# Patient Record
Sex: Female | Born: 1960 | Race: White | Hispanic: No | Marital: Single | Smoking: Never smoker
Health system: Southern US, Community
[De-identification: ages and names within clinical notes are randomized; demographics above are authoritative.]

## PROBLEM LIST (undated history)

## (undated) DIAGNOSIS — F99 Mental disorder, not otherwise specified: Secondary | ICD-10-CM

## (undated) DIAGNOSIS — N840 Polyp of corpus uteri: Secondary | ICD-10-CM

## (undated) DIAGNOSIS — N950 Postmenopausal bleeding: Secondary | ICD-10-CM

## (undated) DIAGNOSIS — E039 Hypothyroidism, unspecified: Secondary | ICD-10-CM

## (undated) DIAGNOSIS — K219 Gastro-esophageal reflux disease without esophagitis: Secondary | ICD-10-CM

## (undated) DIAGNOSIS — F419 Anxiety disorder, unspecified: Secondary | ICD-10-CM

## (undated) DIAGNOSIS — R42 Dizziness and giddiness: Secondary | ICD-10-CM

## (undated) DIAGNOSIS — R195 Other fecal abnormalities: Secondary | ICD-10-CM

## (undated) DIAGNOSIS — R9389 Abnormal findings on diagnostic imaging of other specified body structures: Secondary | ICD-10-CM

## (undated) HISTORY — PX: NO PAST SURGERIES: SHX2092

## (undated) HISTORY — DX: Abnormal findings on diagnostic imaging of other specified body structures: R93.89

## (undated) HISTORY — DX: Polyp of corpus uteri: N84.0

## (undated) HISTORY — DX: Mental disorder, not otherwise specified: F99

## (undated) HISTORY — DX: Other fecal abnormalities: R19.5

## (undated) HISTORY — DX: Postmenopausal bleeding: N95.0

---

## 2002-02-13 ENCOUNTER — Encounter: Payer: Self-pay | Admitting: Family Medicine

## 2002-02-13 ENCOUNTER — Ambulatory Visit (HOSPITAL_COMMUNITY): Admission: RE | Admit: 2002-02-13 | Discharge: 2002-02-13 | Payer: Self-pay | Admitting: Family Medicine

## 2004-06-06 ENCOUNTER — Ambulatory Visit (HOSPITAL_COMMUNITY): Admission: RE | Admit: 2004-06-06 | Discharge: 2004-06-06 | Payer: Self-pay | Admitting: Obstetrics and Gynecology

## 2005-06-12 ENCOUNTER — Ambulatory Visit (HOSPITAL_COMMUNITY): Admission: RE | Admit: 2005-06-12 | Discharge: 2005-06-12 | Payer: Self-pay | Admitting: Family Medicine

## 2006-06-15 ENCOUNTER — Ambulatory Visit (HOSPITAL_COMMUNITY): Admission: RE | Admit: 2006-06-15 | Discharge: 2006-06-15 | Payer: Self-pay | Admitting: Family Medicine

## 2007-06-29 ENCOUNTER — Ambulatory Visit (HOSPITAL_COMMUNITY): Admission: RE | Admit: 2007-06-29 | Discharge: 2007-06-29 | Payer: Self-pay | Admitting: Family Medicine

## 2007-07-18 ENCOUNTER — Other Ambulatory Visit: Admission: RE | Admit: 2007-07-18 | Discharge: 2007-07-18 | Payer: Self-pay | Admitting: Obstetrics and Gynecology

## 2008-07-18 ENCOUNTER — Ambulatory Visit (HOSPITAL_COMMUNITY): Admission: RE | Admit: 2008-07-18 | Discharge: 2008-07-18 | Payer: Self-pay | Admitting: Obstetrics & Gynecology

## 2008-07-18 ENCOUNTER — Other Ambulatory Visit: Admission: RE | Admit: 2008-07-18 | Discharge: 2008-07-18 | Payer: Self-pay | Admitting: Obstetrics and Gynecology

## 2009-08-30 ENCOUNTER — Ambulatory Visit (HOSPITAL_COMMUNITY): Admission: RE | Admit: 2009-08-30 | Discharge: 2009-08-30 | Payer: Self-pay | Admitting: Obstetrics & Gynecology

## 2010-03-23 ENCOUNTER — Encounter: Payer: Self-pay | Admitting: Obstetrics and Gynecology

## 2010-09-17 ENCOUNTER — Other Ambulatory Visit: Payer: Self-pay | Admitting: Obstetrics & Gynecology

## 2010-09-17 DIAGNOSIS — Z139 Encounter for screening, unspecified: Secondary | ICD-10-CM

## 2010-09-23 ENCOUNTER — Ambulatory Visit (HOSPITAL_COMMUNITY)
Admission: RE | Admit: 2010-09-23 | Discharge: 2010-09-23 | Disposition: A | Discharge status: Home / Self Care | Payer: Medicaid Other | Source: Ambulatory Visit | Attending: Obstetrics & Gynecology | Admitting: Obstetrics & Gynecology

## 2010-09-23 DIAGNOSIS — Z1231 Encounter for screening mammogram for malignant neoplasm of breast: Secondary | ICD-10-CM | POA: Insufficient documentation

## 2010-09-23 DIAGNOSIS — Z139 Encounter for screening, unspecified: Secondary | ICD-10-CM

## 2011-01-01 ENCOUNTER — Other Ambulatory Visit (HOSPITAL_COMMUNITY)
Admission: RE | Admit: 2011-01-01 | Discharge: 2011-01-01 | Disposition: A | Discharge status: Home / Self Care | Payer: Medicaid Other | Source: Ambulatory Visit | Attending: Obstetrics and Gynecology | Admitting: Obstetrics and Gynecology

## 2011-01-01 ENCOUNTER — Other Ambulatory Visit: Payer: Self-pay | Admitting: Adult Health

## 2011-01-01 DIAGNOSIS — Z01419 Encounter for gynecological examination (general) (routine) without abnormal findings: Secondary | ICD-10-CM | POA: Insufficient documentation

## 2011-10-06 ENCOUNTER — Other Ambulatory Visit (HOSPITAL_COMMUNITY): Payer: Self-pay | Admitting: Family Medicine

## 2011-10-06 DIAGNOSIS — Z139 Encounter for screening, unspecified: Secondary | ICD-10-CM

## 2011-10-12 ENCOUNTER — Ambulatory Visit (HOSPITAL_COMMUNITY)
Admission: RE | Admit: 2011-10-12 | Discharge: 2011-10-12 | Disposition: A | Discharge status: Home / Self Care | Payer: Medicaid Other | Source: Ambulatory Visit | Attending: Family Medicine | Admitting: Family Medicine

## 2011-10-12 DIAGNOSIS — Z139 Encounter for screening, unspecified: Secondary | ICD-10-CM

## 2011-10-12 DIAGNOSIS — Z1231 Encounter for screening mammogram for malignant neoplasm of breast: Secondary | ICD-10-CM | POA: Insufficient documentation

## 2012-09-27 ENCOUNTER — Other Ambulatory Visit: Payer: Self-pay

## 2012-09-27 DIAGNOSIS — Z1231 Encounter for screening mammogram for malignant neoplasm of breast: Secondary | ICD-10-CM

## 2012-10-14 ENCOUNTER — Ambulatory Visit: Payer: Medicaid Other

## 2012-10-14 ENCOUNTER — Ambulatory Visit (HOSPITAL_COMMUNITY): Payer: Medicaid Other

## 2012-10-18 ENCOUNTER — Ambulatory Visit (HOSPITAL_COMMUNITY)
Admission: RE | Admit: 2012-10-18 | Discharge: 2012-10-18 | Disposition: A | Discharge status: Home / Self Care | Payer: Medicaid Other | Source: Ambulatory Visit | Attending: Adult Health | Admitting: Adult Health

## 2012-10-18 DIAGNOSIS — Z1231 Encounter for screening mammogram for malignant neoplasm of breast: Secondary | ICD-10-CM | POA: Insufficient documentation

## 2012-10-24 ENCOUNTER — Other Ambulatory Visit: Payer: Self-pay | Admitting: Family Medicine

## 2012-10-24 DIAGNOSIS — R928 Other abnormal and inconclusive findings on diagnostic imaging of breast: Secondary | ICD-10-CM

## 2012-11-09 ENCOUNTER — Ambulatory Visit (HOSPITAL_COMMUNITY)
Admission: RE | Admit: 2012-11-09 | Discharge: 2012-11-09 | Disposition: A | Discharge status: Home / Self Care | Payer: Medicaid Other | Source: Ambulatory Visit | Attending: Family Medicine | Admitting: Family Medicine

## 2012-11-09 ENCOUNTER — Other Ambulatory Visit: Payer: Self-pay | Admitting: Family Medicine

## 2012-11-09 DIAGNOSIS — R928 Other abnormal and inconclusive findings on diagnostic imaging of breast: Secondary | ICD-10-CM

## 2013-01-09 ENCOUNTER — Other Ambulatory Visit: Payer: Self-pay | Admitting: Adult Health

## 2013-02-20 ENCOUNTER — Other Ambulatory Visit: Payer: Self-pay | Admitting: Adult Health

## 2013-03-15 ENCOUNTER — Ambulatory Visit (INDEPENDENT_AMBULATORY_CARE_PROVIDER_SITE_OTHER): Payer: Medicaid Other | Admitting: Adult Health

## 2013-03-15 ENCOUNTER — Encounter: Payer: Self-pay | Admitting: Adult Health

## 2013-03-15 ENCOUNTER — Other Ambulatory Visit (HOSPITAL_COMMUNITY)
Admission: RE | Admit: 2013-03-15 | Discharge: 2013-03-15 | Disposition: A | Discharge status: Home / Self Care | Payer: Medicaid Other | Source: Ambulatory Visit | Attending: Adult Health | Admitting: Adult Health

## 2013-03-15 ENCOUNTER — Encounter: Payer: Self-pay | Admitting: Gastroenterology

## 2013-03-15 VITALS — BP 140/72 | HR 78 | Ht 62.0 in | Wt 166.0 lb

## 2013-03-15 DIAGNOSIS — Z01419 Encounter for gynecological examination (general) (routine) without abnormal findings: Secondary | ICD-10-CM | POA: Insufficient documentation

## 2013-03-15 DIAGNOSIS — R195 Other fecal abnormalities: Secondary | ICD-10-CM

## 2013-03-15 DIAGNOSIS — Z1151 Encounter for screening for human papillomavirus (HPV): Secondary | ICD-10-CM | POA: Insufficient documentation

## 2013-03-15 DIAGNOSIS — Z0000 Encounter for general adult medical examination without abnormal findings: Secondary | ICD-10-CM

## 2013-03-15 DIAGNOSIS — Z1212 Encounter for screening for malignant neoplasm of rectum: Secondary | ICD-10-CM

## 2013-03-15 HISTORY — DX: Other fecal abnormalities: R19.5

## 2013-03-15 LAB — HEMOCCULT GUIAC POC 1CARD (OFFICE): Fecal Occult Blood, POC: POSITIVE

## 2013-03-15 NOTE — Progress Notes (Signed)
Patient ID: Audrey Collier, female   DOB: 04-28-1960, 53 y.o.   MRN: [id number] History of Present Illness: Audrey Collier is a 53 year old white female, single, mentally challenged, in for a pap and physical.She lives with her uncle and siblings.Did get flu shot this year.Care giver with her.   Current Medications, Allergies, Past Medical History, Past Surgical History, Family History and Social History were reviewed in [facility] electronic medical record.     Review of Systems: Patient denies any headaches, blurred vision, shortness of breath, chest pain, abdominal pain, problems with bowel movements, urination. No joint pains or mood changes, still has regular periods but has skipped 2 recently.    Physical Exam:BP 140/72  Pulse 78  Ht 5\' 2"  (1.575 m)  Wt 166 lb (75.297 kg)  BMI 30.35 kg/m2  LMP 03/09/2013 General:  Well developed, well nourished, no acute distress Skin:  Warm and dry, left eye is red  Neck:  Midline trachea, normal thyroid Lungs; Clear to auscultation bilaterally Breast:  No dominant palpable mass, retraction, or nipple discharge Cardiovascular: Regular rate and rhythm Abdomen:  Soft, non tender, no hepatosplenomegaly Pelvic:  External genitalia is normal in appearance.  The vagina is normal in appearance.The cervix is not seen well due to her holding legs tight, but pap performed by blind sweep and HPV.  Uterus is felt to be normal size, shape, and contour.  No  adnexal masses or tenderness noted. Rectal: Good sphincter tone, no polyps, or hemorrhoids felt.  Hemoccult positive Extremities:  No swelling or varicosities noted Psych:  No mood changes, fairly cooperative and pleasant   Impression: Yearly gyn exam Positive hemocult    Plan: Physical in 1 year Mammogram yearly  Pap in 3 years if this one normal Will send 3 hemoccult cards home and refer to Dr Harlen LabsFields Labs with Dr Renard MatterMcInnis and see him if eye not better

## 2013-03-15 NOTE — Patient Instructions (Signed)
Physical in 1 year Pap in 3 years if this one normal Mammogram yearly Had + hemoccult(blood in stool) will do 3 cards at home, will refer to Dr Darrick PennaFields for colonoscopy See Dr Renard MatterMcInnis  If eye not better

## 2013-03-31 ENCOUNTER — Ambulatory Visit (INDEPENDENT_AMBULATORY_CARE_PROVIDER_SITE_OTHER): Payer: Medicaid Other | Admitting: Adult Health

## 2013-03-31 DIAGNOSIS — Z1212 Encounter for screening for malignant neoplasm of rectum: Secondary | ICD-10-CM

## 2013-03-31 DIAGNOSIS — R195 Other fecal abnormalities: Secondary | ICD-10-CM

## 2013-03-31 LAB — HEMOCCULT GUIAC POC 1CARD (OFFICE)
Card #2 Fecal Occult Blod, POC: NEGATIVE
FECAL OCCULT BLD: NEGATIVE
FECAL OCCULT BLD: NEGATIVE

## 2013-03-31 NOTE — Progress Notes (Signed)
Patient ID: Audrey Collier, female   DOB: Jan 08, 1961, 53 y.o.   MRN: [id number] Pt brought in 3 hemoccults cards that resulted all negative.

## 2013-04-11 ENCOUNTER — Ambulatory Visit (INDEPENDENT_AMBULATORY_CARE_PROVIDER_SITE_OTHER): Payer: Medicaid Other | Admitting: Gastroenterology

## 2013-04-11 ENCOUNTER — Encounter: Payer: Self-pay | Admitting: Gastroenterology

## 2013-04-11 VITALS — BP 127/77 | HR 86 | Temp 98.6°F | Wt 167.0 lb

## 2013-04-11 DIAGNOSIS — K5900 Constipation, unspecified: Secondary | ICD-10-CM

## 2013-04-11 DIAGNOSIS — R195 Other fecal abnormalities: Secondary | ICD-10-CM

## 2013-04-11 MED ORDER — LINACLOTIDE 145 MCG PO CAPS: 145.0000 ug | ORAL_CAPSULE | Freq: Every day | ORAL | Status: DC

## 2013-04-11 MED ORDER — PEG 3350-KCL-NA BICARB-NACL 420 G PO SOLR: 4000.0000 mL | ORAL | Status: DC

## 2013-04-11 NOTE — Assessment & Plan Note (Signed)
Without signs of overt GI bleeding. No prior colonoscopy. Needs initial screening colonoscopy. NO upper GI symptoms. Obtain outside labs from Dr. Renard MatterMcInnis to evaluate for any abnormalities.   Proceed with colonoscopy with Dr. Darrick PennaFields in the near future. The risks, benefits, and alternatives have been discussed in detail with the patient. They state understanding and desire to proceed.

## 2013-04-11 NOTE — Patient Instructions (Addendum)
Start taking Linzess 1 capsule 30 minutes prior to first meal of day. We have provided a voucher for you for a month supply free.   We have scheduled you for a colonoscopy with Dr. Darrick PennaFields in the future.   Constipation, Adult Constipation is when a person:  Poops (bowel movement) less than 3 times a week.  Has a hard time pooping.  Has poop that is dry, hard, or bigger than normal. HOME CARE   Eat more fiber, such as fruits, vegetables, whole grains like brown rice, and beans.  Eat less fatty foods and sugar. This includes JamaicaFrench fries, hamburgers, cookies, candy, and soda.  If you are not getting enough fiber from food, take products with added fiber in them (supplements).  Drink enough fluid to keep your pee (urine) clear or pale yellow.  Go to the restroom when you feel like you need to poop. Do not hold it.  Only take medicine as told by your doctor. Do not take medicines that help you poop (laxatives) without talking to your doctor first.  Exercise on a regular basis, or as told by your doctor. GET HELP RIGHT AWAY IF:   You have bright red blood in your poop (stool).  Your constipation lasts more than 4 days or gets worse.  You have belly (abdomen) or butt (rectal) pain.  You have thin poop (as thin as a pencil).  You lose weight, and it cannot be explained. MAKE SURE YOU:   Understand these instructions.  Will watch your condition.  Will get help right away if you are not doing well or get worse. Document Released: 08/05/2007 Document Revised: 05/11/2011 Document Reviewed: 11/28/2012 [facility] Patient Information 2014 [place] [place].

## 2013-04-11 NOTE — Assessment & Plan Note (Signed)
Start Linzess 145 mcg daily. Voucher provided. 3 month return.

## 2013-04-11 NOTE — Progress Notes (Signed)
Primary Care Physician:  Alice ReichertMCINNIS,ANGUS G, MD Primary Gastroenterologist:  Dr. Darrick PennaFields   Chief Complaint  Patient presents with  . Colonoscopy    HPI:   Audrey Collier presents today at the request of Cyril MourningJennifer Griffin, NP, secondary to heme positive stool on yearly hemoccult. She has mild mental challenges but able to respond to questions, aware of situation and need for further GI evaluation. She denies any prior colonoscopy. No hematochezia, melena. Chronic constipation. Up to 4-5 days without a BM. No abdominal pain. Coffee helps sometimes. No upper GI symptoms, dysphagia, or loss of appetite.   Past Medical History  Diagnosis Date  . Mental disorder     mentally challenged  . Fecal occult blood test positive 03/15/2013    will send 3 cards home and refer to GI for colonoscopy    Past Surgical History  Procedure Laterality Date  . No past surgeries      Current Outpatient Prescriptions  Medication Sig Dispense Refill  . loratadine (CLARITIN) 10 MG tablet Take 10 mg by mouth daily as needed for allergies.       No current facility-administered medications for this visit.    Allergies as of 04/11/2013  . (No Known Allergies)    Family History  Problem Relation Age of Onset  . Diabetes Maternal Aunt   . Hypertension Maternal Aunt   . Diabetes Maternal Uncle   . Colon cancer Neg Hx     History   Social History  . Marital Status: Single    Spouse Name: N/A    Number of Children: N/A  . Years of Education: N/A   Occupational History  . Not on file.   Social History Main Topics  . Smoking status: Never Smoker   . Smokeless tobacco: Never Used  . Alcohol Use: No  . Drug Use: No  . Sexual Activity: No   Other Topics Concern  . Not on file   Social History Narrative  . No narrative on file    Review of Systems: Gen: Denies any fever, chills, fatigue, weight loss, lack of appetite.  CV: Denies chest pain, heart palpitations, peripheral edema,  syncope.  Resp: +DOE GI: see HPI GU : Denies urinary burning, urinary frequency, urinary hesitancy MS: Denies joint pain, muscle weakness, cramps, or limitation of movement.  Derm: Denies rash, itching, dry skin Psych: Denies depression, anxiety, memory loss, and confusion Heme: Denies bruising, bleeding, and enlarged lymph nodes.  Physical Exam: BP 127/77  Pulse 86  Temp(Src) 98.6 F (37 C) (Oral)  Wt 167 lb (75.751 kg)  LMP 04/04/2013 General:   Alert and oriented. Pleasant and cooperative. Well-nourished and well-developed.  Head:  Normocephalic and atraumatic. Eyes:  Without icterus, sclera clear and conjunctiva pink.  Ears:  Normal auditory acuity. Nose:  No deformity, discharge,  or lesions. Mouth:  No deformity or lesions, oral mucosa pink.  Neck:  Supple, without mass or thyromegaly. Lungs:  Clear to auscultation bilaterally. No wheezes, rales, or rhonchi. No distress.  Heart:  S1, S2 present without murmurs appreciated.  Abdomen:  +BS, soft, non-tender and non-distended. No HSM noted. No guarding or rebound. No masses appreciated.  Rectal:  Deferred  Msk:  Symmetrical without gross deformities. Normal posture. Extremities:  Without clubbing or edema. Neurologic:  Alert and  oriented x4;  grossly normal neurologically. Skin:  Intact without significant lesions or rashes. Cervical Nodes:  No significant cervical adenopathy. Psych:  Alert and cooperative. Normal mood and affect.

## 2013-04-12 NOTE — Progress Notes (Signed)
cc'd to pcp 

## 2013-04-17 ENCOUNTER — Encounter (HOSPITAL_COMMUNITY): Payer: Self-pay | Admitting: Pharmacy Technician

## 2013-04-20 ENCOUNTER — Other Ambulatory Visit (HOSPITAL_COMMUNITY): Payer: Self-pay | Admitting: Family Medicine

## 2013-04-20 DIAGNOSIS — R928 Other abnormal and inconclusive findings on diagnostic imaging of breast: Secondary | ICD-10-CM

## 2013-05-05 ENCOUNTER — Encounter (HOSPITAL_COMMUNITY): Admission: RE | Payer: Self-pay | Source: Ambulatory Visit

## 2013-05-05 ENCOUNTER — Ambulatory Visit (HOSPITAL_COMMUNITY): Admission: RE | Admit: 2013-05-05 | Payer: Medicaid Other | Source: Ambulatory Visit | Admitting: Gastroenterology

## 2013-05-05 SURGERY — COLONOSCOPY
Anesthesia: Moderate Sedation

## 2013-05-08 ENCOUNTER — Telehealth: Payer: Self-pay | Admitting: Gastroenterology

## 2013-05-08 NOTE — Telephone Encounter (Signed)
Patient was scheduled for TCS w/SLF on Friday March 6th and her aide cancelled because they could not get her to drink the prep she would not even attempt to consume it, please advise

## 2013-05-08 NOTE — Telephone Encounter (Signed)
Needs OV to discuss

## 2013-05-08 NOTE — Telephone Encounter (Signed)
Appointment R/S for April 8th

## 2013-05-17 ENCOUNTER — Ambulatory Visit (HOSPITAL_COMMUNITY)
Admission: RE | Admit: 2013-05-17 | Discharge: 2013-05-17 | Disposition: A | Discharge status: Home / Self Care | Payer: Medicaid Other | Source: Ambulatory Visit | Attending: Family Medicine | Admitting: Family Medicine

## 2013-05-17 DIAGNOSIS — R928 Other abnormal and inconclusive findings on diagnostic imaging of breast: Secondary | ICD-10-CM

## 2013-05-17 DIAGNOSIS — Z09 Encounter for follow-up examination after completed treatment for conditions other than malignant neoplasm: Secondary | ICD-10-CM | POA: Insufficient documentation

## 2013-05-17 DIAGNOSIS — N63 Unspecified lump in unspecified breast: Secondary | ICD-10-CM | POA: Insufficient documentation

## 2013-06-07 ENCOUNTER — Ambulatory Visit: Payer: Medicaid Other | Admitting: Gastroenterology

## 2013-06-07 ENCOUNTER — Telehealth: Payer: Self-pay | Admitting: Gastroenterology

## 2013-06-07 ENCOUNTER — Encounter: Payer: Self-pay | Admitting: Gastroenterology

## 2013-06-07 NOTE — Telephone Encounter (Signed)
MAILED LETTER °

## 2013-06-07 NOTE — Telephone Encounter (Signed)
Pt was a no show

## 2013-07-10 ENCOUNTER — Encounter (INDEPENDENT_AMBULATORY_CARE_PROVIDER_SITE_OTHER): Payer: Self-pay

## 2013-07-10 ENCOUNTER — Ambulatory Visit (INDEPENDENT_AMBULATORY_CARE_PROVIDER_SITE_OTHER): Payer: Medicaid Other | Admitting: Gastroenterology

## 2013-07-10 ENCOUNTER — Encounter: Payer: Self-pay | Admitting: Gastroenterology

## 2013-07-10 VITALS — BP 123/78 | HR 95 | Temp 97.4°F | Ht 65.0 in | Wt 167.2 lb

## 2013-07-10 DIAGNOSIS — R195 Other fecal abnormalities: Secondary | ICD-10-CM

## 2013-07-10 DIAGNOSIS — K5900 Constipation, unspecified: Secondary | ICD-10-CM

## 2013-07-10 MED ORDER — LINACLOTIDE 290 MCG PO CAPS: 290.0000 ug | ORAL_CAPSULE | Freq: Every day | ORAL | Status: DC

## 2013-07-10 NOTE — Assessment & Plan Note (Addendum)
Heme+ stool. Cancelled last colonoscopy due to patient not taking bowel prep. States she did not like the way it tastes. Was given trilitely. Patient's caregiver, Leta BaptistRhonda Heidler (Aunt by marriage) interested in trying once more. We will try with MiraLax prep which may be more palatable for the patient. Also start Linzess 290mcg daily for constipation.  Check CBC. I have discussed the risks, alternatives, benefits with regards to but not limited to the risk of reaction to medication, bleeding, infection, perforation and the patient is agreeable to proceed. Written consent to be obtained.  Patient does not have official healthcare POA. Discussed with Leta BaptistRhonda Rho (Aunt by marriage) today that they need to consider obtaining in near future. Patient has some mild cognitive deficits and not consentable. We have call in with appropriate parties to determine if next of kin can consent or if official POA has to be obtained.

## 2013-07-10 NOTE — Progress Notes (Signed)
cc'd to pcp 

## 2013-07-10 NOTE — Progress Notes (Addendum)
Primary Care Physician: Alice ReichertMCINNIS,ANGUS G, MD  Primary Gastroenterologist:  Jonette EvaSandi Fields, MD   Chief Complaint  Patient presents with  . Follow-up    HPI: Audrey Collier is a 53 y.o. female here for followup. She was seen back in February 2015 for Hemoccult-positive stool. She has mild cognitive deficits. She was scheduled for colonoscopy she would not take the bowel prep due to the taste. She has chronic constipation. May go up to 4-5 days without a bowel movement. Given Linzess at last ov, prescription was never picked up. History obtained from Audrey Collier Schar, aunt by marriage whom the patient lives with. There's been no report of abdominal pain, vomiting. Appetite is good. No other complaints such as heartburn, dysphagia. No reported melena or rectal bleeding.  Current Outpatient Prescriptions  Medication Sig Dispense Refill  . calcium-vitamin D 250-100 MG-UNIT per tablet Take 1 tablet by mouth 2 (two) times daily. Unknown amount taken by patient.      . fexofenadine (ALLEGRA) 180 MG tablet Take 180 mg by mouth daily.      . Multiple Vitamin (MULTIVITAMIN) tablet Take 1 tablet by mouth daily.      . Linaclotide (LINZESS) 290 MCG CAPS capsule Take 1 capsule (290 mcg total) by mouth daily.  30 capsule  11   No current facility-administered medications for this visit.    Allergies as of 07/10/2013  . (No Known Allergies)   Past Medical History  Diagnosis Date  . Mental disorder     mentally challenged  . Fecal occult blood test positive 03/15/2013    will send 3 cards home and refer to GI for colonoscopy   Past Surgical History  Procedure Laterality Date  . No past surgeries     Family History  Problem Relation Age of Onset  . Diabetes Maternal Aunt   . Hypertension Maternal Aunt   . Diabetes Maternal Uncle   . Colon cancer Neg Hx    History   Social History  . Marital Status: Single    Spouse Name: N/A    Number of Children: N/A  . Years of Education: N/A    Social History Main Topics  . Smoking status: Never Smoker   . Smokeless tobacco: Never Used  . Alcohol Use: No  . Drug Use: No  . Sexual Activity: No   Other Topics Concern  . None   Social History Narrative  . None    ROS:  General: Negative for anorexia, weight loss, fever, chills, fatigue, weakness. ENT: Negative for hoarseness, difficulty swallowing , nasal congestion. CV: Negative for chest pain, angina, palpitations, dyspnea on exertion, peripheral edema.  Respiratory: Negative for dyspnea at rest, dyspnea on exertion, cough, sputum, wheezing.  GI: See history of present illness. GU:  Negative for dysuria, hematuria, urinary incontinence, urinary frequency, nocturnal urination.  Endo: Negative for unusual weight change.    Physical Examination:   BP 123/78  Pulse 95  Temp(Src) 97.4 F (36.3 C) (Oral)  Ht 5\' 5"  (1.651 m)  Wt 167 lb 3.2 oz (75.841 kg)  BMI 27.82 kg/m2  General: Well-nourished, well-developed in no acute distress.  Eyes: No icterus. Mouth: Oropharyngeal mucosa moist and pink , no lesions erythema or exudate. Lungs: Clear to auscultation bilaterally.  Heart: Regular rate and rhythm, no murmurs rubs or gallops.  Abdomen: Bowel sounds are normal, nontender, nondistended, no hepatosplenomegaly or masses, no abdominal bruits or hernia , no rebound or guarding.   Extremities: No lower extremity edema.  No clubbing or deformities. Neuro: Alert and oriented x 4   Skin: Warm and dry, no jaundice.   Psych: Alert and cooperative, normal mood and affect.

## 2013-07-10 NOTE — Patient Instructions (Signed)
1. Colonoscopy as scheduled. Please see separate instructions. 2. Start Linzess daily, 30 minutes before breakfast for constipation. Hold if you develop severe diarrhea. Call with any problems. 3. Please have your blood work done.

## 2013-07-11 LAB — CBC WITH DIFFERENTIAL/PLATELET
BASOS ABS: 0 10*3/uL (ref 0.0–0.1)
Basophils Relative: 0 % (ref 0–1)
Eosinophils Absolute: 0.3 10*3/uL (ref 0.0–0.7)
Eosinophils Relative: 3 % (ref 0–5)
HCT: 30.2 % — ABNORMAL LOW (ref 36.0–46.0)
HEMOGLOBIN: 8.7 g/dL — AB (ref 12.0–15.0)
LYMPHS PCT: 16 % (ref 12–46)
Lymphs Abs: 1.4 10*3/uL (ref 0.7–4.0)
MCH: 18.7 pg — ABNORMAL LOW (ref 26.0–34.0)
MCHC: 28.8 g/dL — ABNORMAL LOW (ref 30.0–36.0)
MCV: 64.8 fL — AB (ref 78.0–100.0)
Monocytes Absolute: 0.4 10*3/uL (ref 0.1–1.0)
Monocytes Relative: 5 % (ref 3–12)
NEUTROS ABS: 6.7 10*3/uL (ref 1.7–7.7)
NEUTROS PCT: 76 % (ref 43–77)
PLATELETS: 325 10*3/uL (ref 150–400)
RBC: 4.66 MIL/uL (ref 3.87–5.11)
RDW: 18.4 % — AB (ref 11.5–15.5)
WBC: 8.8 10*3/uL (ref 4.0–10.5)

## 2013-07-11 NOTE — Progress Notes (Signed)
I spoke with Audrey Collier and made her aware per Audrey Collier, [employer] Compliance, they need to contact [employer] and obtain Guardianship paper work before we can proceed with an elective tcs.    Audrey Collier stated she will give [employer] a call and call us back to reschedule, once she receives guardianship paperwork.

## 2013-07-12 NOTE — Progress Notes (Signed)
Quick Note:  She has profound microcytic anemia. -->Start ferrous sulfate 325mg  BID. -->Need to add-on iron/tibc, ferritin, TTG, IgA level. -->Patient now needs TCS +/- EGD for microcytic anemia, heme positive stool. Hold iron 7 days before.  -->Please let guardian know that we need to move on this sooner than later, please get paperwork in order so they can consent for testing.    ______

## 2013-07-13 ENCOUNTER — Other Ambulatory Visit: Payer: Self-pay

## 2013-07-13 DIAGNOSIS — D649 Anemia, unspecified: Secondary | ICD-10-CM

## 2013-07-13 NOTE — Progress Notes (Signed)
Quick Note:  I called and spoke to a caregiver, Estella HuskBilly JO. She will give a message to [place] and Chanetta MarshallJimmy to return the call. Lab orders have been faced to [facility]. ______

## 2013-07-13 NOTE — Progress Notes (Signed)
Quick Note:  I called [facility] Customer service and these labs cannot be added. I will [facility] and call and have pt go back. ______

## 2013-07-18 NOTE — Progress Notes (Signed)
Quick Note:  LMOM for a return call and also mailed a letter for Chanetta MarshallJimmy or Bjorn LoserRhonda to call. ______

## 2013-07-31 ENCOUNTER — Other Ambulatory Visit: Payer: Self-pay | Admitting: Gastroenterology

## 2013-07-31 ENCOUNTER — Telehealth: Payer: Self-pay | Admitting: *Deleted

## 2013-07-31 NOTE — Telephone Encounter (Signed)
Pt was returning a call. Please advise [phone]

## 2013-07-31 NOTE — Progress Notes (Signed)
Quick Note:  I had a VM from [facility], and she said they were checking in to [facility] or Legal guardian of pt and it looks like it will take awhile. She said if pt cannot sign for herself to have a colonoscopy, then it will be awhile. I called her back at [phone] and [facility] for a return call.  I told her pt needs some medication and some labs, please call ASAP. ______

## 2013-07-31 NOTE — Progress Notes (Signed)
Quick Note:  I spoke to [place]. She said they went to see a lawyer and he said they will have to take pt to PCP and have them say that pt is not capable of making her medical decisions. Then they will be sent to [employer]. Then sent back to lawyers office.  She said she is working 12 hours a day on second shift and trying to take care of all of her husband's siblings. She has different appts for the others also. She has not yet made the appt for this pt to see PCP about this.  She is aware for pt to start the Ferrous sulfate 325 mg bid And get the labs done.  I asked her to call me in a few days and give me an update on the status of their plans. ______

## 2013-08-01 LAB — IGA: IgA: <6 mg/dL — ABNORMAL LOW (ref 69–380)

## 2013-08-01 LAB — TISSUE TRANSGLUTAMINASE, IGA: Tissue Transglutaminase Ab, IgA: 1 U/mL

## 2013-08-01 LAB — IRON AND TIBC
%SAT: 5 % — ABNORMAL LOW (ref 20–55)
Iron: 21 ug/dL — ABNORMAL LOW (ref 42–145)
TIBC: 450 ug/dL (ref 250–470)
UIBC: 429 ug/dL — ABNORMAL HIGH (ref 125–400)

## 2013-08-01 LAB — FERRITIN: FERRITIN: 2 ng/mL — AB (ref 10–291)

## 2013-08-01 NOTE — Telephone Encounter (Signed)
I called and spoke to [place] on 07/31/2013 at 2:02.

## 2013-08-03 NOTE — Progress Notes (Signed)
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 Need to see if TTG IgG can be added on to labs. Patient clearly has IDA and very important to rule out celiac disease given that procedures on hold. ______

## 2013-08-04 NOTE — Progress Notes (Signed)
Quick Note:  I called Hospital doctor and added the TTG IgG. Spoke to [place]. ______

## 2013-08-07 LAB — TISSUE TRANSGLUTAMINASE, IGG: Tissue Transglut Ab: 3.4 U/mL (ref <20)

## 2013-08-07 NOTE — Progress Notes (Signed)
Quick Note:  No evidence of celiac disease. Caregiver working on getting POA for TCS+/-EGD but will take time per prior conversations.  Patient has had significant undetectable IgA level, NOT normal.  Since work up has been delayed, we will need to recheck her CBC and IgA level in 2 weeks. ______

## 2013-08-10 ENCOUNTER — Other Ambulatory Visit: Payer: Self-pay

## 2013-08-10 DIAGNOSIS — D582 Other hemoglobinopathies: Secondary | ICD-10-CM

## 2013-08-10 NOTE — Progress Notes (Signed)
Quick Note:  I called and gave the report to [facility]. She said they still haven't done anything about the POA, she has been having trouble with her teeth, root canal, etc. They are going on vacation on the week of August 21, 2013 but she will try to do the lab work around 08/28/2013. She said she spoke to [facility] at Dr. Renard Matter office and she said they have never had to do all of this before about the POA, etc. I told her things have changed a lot. Lab orders faxed to [facility].   Bjorn Loser did say that she felt that [place] would understand if they explained to her, and could sign for herself. Said she was aware and was planning to drink the prep.  She said if they wanted to do that, they could call Bjorn Loser and schedule with her on a day that she will be off and she can take the pt to the hospital. I told her that I would let Verlon Au know. ______

## 2013-10-26 NOTE — Progress Notes (Signed)
Quick Note:  Patient still needs TCS +/- EGD for microcytic anemia, heme positive stool. Hold iron 7 days before.  She stills needs updated CBC, IgA level. There has been issues about consent. We recently were told by Risk Management that a "legally appointed guardian" or the majority of parents/children available, or an individual with established relationship with the patient acting in good faith on behalf of the patient" could consent. I believe patient's uncle or aunt whom she lives with would likely qualify. I will request Camille's advise.    ______

## 2013-10-27 NOTE — Progress Notes (Signed)
Quick Note:  Please contact family regarding TCS+/-EGD for microcytic anemia and heme +stool. We can triage her. Notify endo regarding consent issues. See other notes here. ______

## 2013-10-27 NOTE — Progress Notes (Signed)
Quick Note:  I agree with you Verlon Au, please make sure we alert Endo ahead of time, Soledad Gerlach. ______

## 2013-10-30 NOTE — Progress Notes (Signed)
Quick Note:  I called and spoke to pt's caregiver, Audrey Collier. She said that [place] and Bjorn Loser have gone on vacation and should return Thursday. She will tell them to call here and also I am mailed a letter to them, letting them know that one of them can sign for pt to have procedure. ______

## 2013-11-07 ENCOUNTER — Telehealth: Payer: Self-pay | Admitting: Gastroenterology

## 2013-11-07 NOTE — Telephone Encounter (Signed)
Bjorn Loser would like for pt to be scheduled for her procedures this week or next week if possible. Bjorn Loser has had knee surgery and is still out of work now. Please advise!

## 2013-11-08 ENCOUNTER — Telehealth: Payer: Self-pay

## 2013-11-08 NOTE — Telephone Encounter (Signed)
Per Tana Coast, PA ok to triage pt for TCS/EGD for positive hemoccult and microcytic anemia.

## 2013-11-08 NOTE — Telephone Encounter (Signed)
Discussed with Dr. Darrick Penna. OK to triage for TCS/EGD.  Patient will need to hold iron 7 days before.

## 2013-11-10 NOTE — Telephone Encounter (Signed)
Audrey Collier RETURNING CALL ABOUT [name]'S MEDICATION.    PLEASE CALL HER [phone]

## 2013-11-13 ENCOUNTER — Other Ambulatory Visit: Payer: Self-pay

## 2013-11-13 DIAGNOSIS — R195 Other fecal abnormalities: Secondary | ICD-10-CM

## 2013-11-13 DIAGNOSIS — D509 Iron deficiency anemia, unspecified: Secondary | ICD-10-CM

## 2013-11-13 NOTE — Telephone Encounter (Signed)
Triage sent to Dr. Darrick Penna.

## 2013-11-13 NOTE — Telephone Encounter (Signed)
MOVI PREP SPLIT DOSING- CLEAR LIQUIDS WITH BREAKFAST.  

## 2013-11-13 NOTE — Telephone Encounter (Signed)
Gastroenterology Pre-Procedure Review  Request Date: 9/11/08/2013 Requesting Physician: Tana Coast, PA  PATIENT REVIEW QUESTIONS: The patient responded to the following health history questions as indicated:    1. Diabetes Melitis: no 2. Joint replacements in the past 12 months: no 3. Major health problems in the past 3 months: no 4. Has an artificial valve or MVP: no 5. Has a defibrillator: no 6. Has been advised in past to take antibiotics in advance of a procedure like teeth cleaning: no    MEDICATIONS & ALLERGIES:    Patient reports the following regarding taking any blood thinners:   Plavix? no Aspirin? no Coumadin? no  Patient confirms/reports the following medications:  Current Outpatient Prescriptions  Medication Sig Dispense Refill  . calcium-vitamin D 250-100 MG-UNIT per tablet Take 1 tablet by mouth 2 (two) times daily. Unknown amount taken by patient.      . fexofenadine (ALLEGRA) 180 MG tablet Take 180 mg by mouth daily.      . Linaclotide (LINZESS) 290 MCG CAPS capsule Take 1 capsule (290 mcg total) by mouth daily.  30 capsule  11  . Multiple Vitamin (MULTIVITAMIN) tablet Take 1 tablet by mouth daily.      . NON FORMULARY IRON  65 MG    TAKES ONE TABLET DAILY       No current facility-administered medications for this visit.    Patient confirms/reports the following allergies:  No Known Allergies  No orders of the defined types were placed in this encounter.    AUTHORIZATION INFORMATION Primary Insurance:   ID #:  Group #:  Pre-Cert / Auth required:  Pre-Cert / Auth #:   Secondary Insurance:   ID #:  Group #:  Pre-Cert / Auth required: Pre-Cert / Auth #:   SCHEDULE INFORMATION: Procedure has been scheduled as follows:  Date:  12/01/2013                   Time: 10:30 AM  Location: [facility] Short Stay  This Gastroenterology Pre-Precedure Review Form is being routed to the following provider(s): Jonette Eva, MD

## 2013-11-14 MED ORDER — PEG-KCL-NACL-NASULF-NA ASC-C 100 G PO SOLR: 1.0000 | ORAL | Status: DC

## 2013-11-14 NOTE — Telephone Encounter (Signed)
Rx sent to the pharmacy and instructions mailed to pt.  

## 2013-11-14 NOTE — Telephone Encounter (Signed)
Written instructions added to her instructions to hold the iron for 7 days prior to procedure.

## 2013-11-24 NOTE — Telephone Encounter (Signed)
I called and told Britta Mccreedy in Endo that pt's caregiver's, brother and sister-in-law,  can sign for the pt to have the procedures on 12/01/2013. I told her if she has questions, she may call and speak to [place].

## 2013-11-24 NOTE — Telephone Encounter (Signed)
Please see Result note of 07/31/2013 ( Tissue Transglutaminase) for further explanation.

## 2013-12-01 ENCOUNTER — Encounter (HOSPITAL_COMMUNITY)
Admission: RE | Disposition: A | Discharge status: Home / Self Care | Payer: Self-pay | Source: Ambulatory Visit | Attending: Gastroenterology

## 2013-12-01 ENCOUNTER — Encounter (HOSPITAL_COMMUNITY): Payer: Self-pay

## 2013-12-01 ENCOUNTER — Ambulatory Visit (HOSPITAL_COMMUNITY)
Admission: RE | Admit: 2013-12-01 | Discharge: 2013-12-01 | Disposition: A | Discharge status: Home / Self Care | Payer: Medicaid Other | Source: Ambulatory Visit | Attending: Gastroenterology | Admitting: Gastroenterology

## 2013-12-01 DIAGNOSIS — K648 Other hemorrhoids: Secondary | ICD-10-CM | POA: Diagnosis not present

## 2013-12-01 DIAGNOSIS — B9681 Helicobacter pylori [H. pylori] as the cause of diseases classified elsewhere: Secondary | ICD-10-CM | POA: Diagnosis not present

## 2013-12-01 DIAGNOSIS — D509 Iron deficiency anemia, unspecified: Secondary | ICD-10-CM | POA: Insufficient documentation

## 2013-12-01 DIAGNOSIS — R195 Other fecal abnormalities: Secondary | ICD-10-CM

## 2013-12-01 DIAGNOSIS — K2960 Other gastritis without bleeding: Secondary | ICD-10-CM | POA: Diagnosis not present

## 2013-12-01 DIAGNOSIS — Q438 Other specified congenital malformations of intestine: Secondary | ICD-10-CM | POA: Diagnosis not present

## 2013-12-01 HISTORY — PX: COLONOSCOPY: SHX5424

## 2013-12-01 HISTORY — PX: ESOPHAGOGASTRODUODENOSCOPY: SHX5428

## 2013-12-01 SURGERY — COLONOSCOPY
Anesthesia: Moderate Sedation

## 2013-12-01 MED ORDER — SODIUM CHLORIDE 0.9 % IV SOLN
INTRAVENOUS | Status: DC
  Administered 2013-12-01: 10:00:00 via INTRAVENOUS

## 2013-12-01 MED ORDER — MIDAZOLAM HCL 5 MG/5ML IJ SOLN
INTRAMUSCULAR | Status: DC | PRN
  Administered 2013-12-01: 2 mg via INTRAVENOUS
  Administered 2013-12-01 (×3): 1 mg via INTRAVENOUS
  Administered 2013-12-01 (×2): 2 mg via INTRAVENOUS
  Administered 2013-12-01: 1 mg via INTRAVENOUS

## 2013-12-01 MED ORDER — STERILE WATER FOR IRRIGATION IR SOLN
Status: DC | PRN
  Administered 2013-12-01: 11:00:00

## 2013-12-01 MED ORDER — LIDOCAINE VISCOUS 2 % MT SOLN
OROMUCOSAL | Status: DC | PRN
  Administered 2013-12-01: 3 mL via OROMUCOSAL

## 2013-12-01 MED ORDER — LIDOCAINE VISCOUS 2 % MT SOLN
OROMUCOSAL | Status: AC
  Filled 2013-12-01: qty 15

## 2013-12-01 MED ORDER — MEPERIDINE HCL 100 MG/ML IJ SOLN
INTRAMUSCULAR | Status: DC | PRN
  Administered 2013-12-01 (×3): 25 mg via INTRAVENOUS

## 2013-12-01 MED ORDER — MEPERIDINE HCL 100 MG/ML IJ SOLN
INTRAMUSCULAR | Status: AC
  Filled 2013-12-01: qty 2

## 2013-12-01 MED ORDER — MIDAZOLAM HCL 5 MG/5ML IJ SOLN
INTRAMUSCULAR | Status: AC
  Filled 2013-12-01: qty 10

## 2013-12-01 NOTE — H&P (Addendum)
  Primary Care Physician:  Lanette Hampshire, MD Primary Gastroenterologist:  Dr. Oneida Alar  Pre-Procedure History & Physical: HPI:  Audrey Collier is a 53 y.o. female here for HEME POS STOOLS.   Past Medical History  Diagnosis Date  . Mental disorder     mentally challenged  . Fecal occult blood test positive 03/15/2013    will send 3 cards home and refer to GI for colonoscopy    Past Surgical History  Procedure Laterality Date  . No past surgeries      Prior to Admission medications   Medication Sig Start Date End Date Taking? Authorizing Provider  calcium-vitamin D 250-100 MG-UNIT per tablet Take 1 tablet by mouth 2 (two) times daily. Unknown amount taken by patient.   Yes Historical Provider, MD  fexofenadine (ALLEGRA) 180 MG tablet Take 180 mg by mouth daily.   Yes Historical Provider, MD  Linaclotide (LINZESS) 290 MCG CAPS capsule Take 1 capsule (290 mcg total) by mouth daily. 07/10/13  Yes Mahala Menghini, PA-C  Multiple Vitamin (MULTIVITAMIN) tablet Take 1 tablet by mouth daily.   Yes Historical Provider, MD  NON FORMULARY IRON  65 MG    TAKES ONE TABLET DAILY   Yes Historical Provider, MD  peg 3350 powder (MOVIPREP) 100 G SOLR Take 1 kit (200 g total) by mouth as directed. 11/14/13  Yes Danie Binder, MD    Allergies as of 11/13/2013  . (No Known Allergies)    Family History  Problem Relation Age of Onset  . Diabetes Maternal Aunt   . Hypertension Maternal Aunt   . Diabetes Maternal Uncle   . Colon cancer Neg Hx     History   Social History  . Marital Status: Single    Spouse Name: N/A    Number of Children: N/A  . Years of Education: N/A   Occupational History  . Not on file.   Social History Main Topics  . Smoking status: Never Smoker   . Smokeless tobacco: Never Used  . Alcohol Use: No  . Drug Use: No  . Sexual Activity: No   Other Topics Concern  . Not on file   Social History Narrative  . No narrative on file    Review of Systems: See HPI,  otherwise negative ROS   Physical Exam: BP 135/70  Pulse 101  Temp(Src) 98.2 F (36.8 C) (Oral)  Resp 16  Ht _0  (1.702 m)  SpO2 99%  LMP 04/16/2013 General:   Alert,  pleasant and cooperative in NAD Head:  Normocephalic and atraumatic. Neck:  Supple; Lungs:  Clear throughout to auscultation.    Heart:  Regular rate and rhythm. Abdomen:  Soft, nontender and nondistended. Normal bowel sounds, without guarding, and without rebound.   Neurologic:  Alert and  oriented x4;  grossly normal neurologically.  Impression/Plan:     HEME POS STOOLS  PLAN:  1.TCS?EGD

## 2013-12-01 NOTE — Discharge Instructions (Addendum)
NO OBVIOUS SOURCE FOR A LOW BLOOD COUNT& IRON WAS IDENTIFIED.  You HAVE internal hemorrhoids. You have gastritis.  I biopsied your stomach AND SMALL BOWEL.     FOLLOW A HIGH FIBER/LOW FAT DIET. AVOID ITEMS THAT CAUSE BLOATING. SEE INFO BELOW.  YOUR BIOPSY RESULTS WILL BE BACK IN 14 DAYS.  Next colonoscopy in 10 years.   ENDOSCOPY Care After Read the instructions outlined below and refer to this sheet in the next week. These discharge instructions provide you with general information on caring for yourself after you leave the hospital. While your treatment has been planned according to the most current medical practices available, unavoidable complications occasionally occur. If you have any problems or questions after discharge, call DR. Lakita Sahlin, [phone].  ACTIVITY  You may resume your regular activity, but move at a slower pace for the next 24 hours.   Take frequent rest periods for the next 24 hours.   Walking will help get rid of the air and reduce the bloated feeling in your belly (abdomen).   No driving for 24 hours (because of the medicine (anesthesia) used during the test).   You may shower.   Do not sign any important legal documents or operate any machinery for 24 hours (because of the anesthesia used during the test).    NUTRITION  Drink plenty of fluids.   You may resume your normal diet as instructed by your doctor.   Begin with a light meal and progress to your normal diet. Heavy or fried foods are harder to digest and may make you feel sick to your stomach (nauseated).   Avoid alcoholic beverages for 24 hours or as instructed.    MEDICATIONS  You may resume your normal medications.   WHAT YOU CAN EXPECT TODAY  Some feelings of bloating in the abdomen.   Passage of more gas than usual.   Spotting of blood in your stool or on the toilet paper  .  IF YOU HAD POLYPS REMOVED DURING THE ENDOSCOPY:  Eat a soft diet IF YOU HAVE NAUSEA, BLOATING,  ABDOMINAL PAIN, OR VOMITING.    FINDING OUT THE RESULTS OF YOUR TEST Not all test results are available during your visit. DR. Darrick Penna WILL CALL YOU WITHIN 14 DAYS OF YOUR PROCEDUE WITH YOUR RESULTS. Do not assume everything is normal if you have not heard from DR. Swetha Rayle IN TWO WEEKS, CALL HER OFFICE AT [phone].  SEEK IMMEDIATE MEDICAL ATTENTION AND CALL THE OFFICE: [phone] IF:  You have more than a spotting of blood in your stool.   Your belly is swollen (abdominal distention).   You are nauseated or vomiting.   You have a temperature over 101F.   You have abdominal pain or discomfort that is severe or gets worse throughout the day.   Gastritis  Gastritis is an inflammation (the body's way of reacting to injury and/or infection) of the stomach. It is often caused by viral or bacterial (germ) infections. It can also be caused BY ALCOHOL, ASPIRIN, BC/GOODY POWDER'S, (IBUPROFEN) MOTRIN, OR ALEVE (NAPROXEN), chemicals (including alcohol), SPICY FOODS, and medications. This illness may be associated with generalized malaise (feeling tired, not well), UPPER ABDOMINAL STOMACH cramps, and fever. One common bacterial cause of gastritis is an organism known as H. Pylori. This can be treated with antibiotics.    High-Fiber Diet A high-fiber diet changes your normal diet to include more whole grains, legumes, fruits, and vegetables. Changes in the diet involve replacing refined carbohydrates with unrefined foods. The  calorie level of the diet is essentially unchanged. The Dietary Reference Intake (recommended amount) for adult males is 38 grams per day. For adult females, it is 25 grams per day. Pregnant and lactating women should consume 28 grams of fiber per day. Fiber is the intact part of a plant that is not broken down during digestion. Functional fiber is fiber that has been isolated from the plant to provide a beneficial effect in the body. PURPOSE  Increase stool bulk.   Ease  and regulate bowel movements.   Lower cholesterol.  INDICATIONS THAT YOU NEED MORE FIBER  Constipation and hemorrhoids.   Uncomplicated diverticulosis (intestine condition) and irritable bowel syndrome.   Weight management.   As a protective measure against hardening of the arteries (atherosclerosis), diabetes, and cancer.   GUIDELINES FOR INCREASING FIBER IN THE DIET  Start adding fiber to the diet slowly. A gradual increase of about 5 more grams (2 slices of whole-wheat bread, 2 servings of most fruits or vegetables, or 1 bowl of high-fiber cereal) per day is best. Too rapid an increase in fiber may result in constipation, flatulence, and bloating.   Drink enough water and fluids to keep your urine clear or pale yellow. Water, juice, or caffeine-free drinks are recommended. Not drinking enough fluid may cause constipation.   Eat a variety of high-fiber foods rather than one type of fiber.   Try to increase your intake of fiber through using high-fiber foods rather than fiber pills or supplements that contain small amounts of fiber.   The goal is to change the types of food eaten. Do not supplement your present diet with high-fiber foods, but replace foods in your present diet.  INCLUDE A VARIETY OF FIBER SOURCES  Replace refined and processed grains with whole grains, canned fruits with fresh fruits, and incorporate other fiber sources. White rice, white breads, and most bakery goods contain little or no fiber.   Brown whole-grain rice, buckwheat oats, and many fruits and vegetables are all good sources of fiber. These include: broccoli, Brussels sprouts, cabbage, cauliflower, beets, sweet potatoes, white potatoes (skin on), carrots, tomatoes, eggplant, squash, berries, fresh fruits, and dried fruits.   Cereals appear to be the richest source of fiber. Cereal fiber is found in whole grains and bran. Bran is the fiber-rich outer coat of cereal grain, which is largely removed in  refining. In whole-grain cereals, the bran remains. In breakfast cereals, the largest amount of fiber is found in those with "bran" in their names. The fiber content is sometimes indicated on the label.   You may need to include additional fruits and vegetables each day.   In baking, for 1 cup white flour, you may use the following substitutions:   1 cup whole-wheat flour minus 2 tablespoons.   1/2 cup white flour plus 1/2 cup whole-wheat flour.   Low-Fat Diet BREADS, CEREALS, PASTA, RICE, DRIED PEAS, AND BEANS These products are high in carbohydrates and most are low in fat. Therefore, they can be increased in the diet as substitutes for fatty foods. They too, however, contain calories and should not be eaten in excess. Cereals can be eaten for snacks as well as for breakfast.  Include foods that contain fiber (fruits, vegetables, whole grains, and legumes). Research shows that fiber may lower blood cholesterol levels, especially the water-soluble fiber found in fruits, vegetables, oat products, and legumes. FRUITS AND VEGETABLES It is good to eat fruits and vegetables. Besides being sources of fiber, both are rich in  vitamins and some minerals. They help you get the daily allowances of these nutrients. Fruits and vegetables can be used for snacks and desserts. MEATS Limit lean meat, chicken, [place], and fish to no more than 6 ounces per day. Beef, Pork, and Lamb Use lean cuts of beef, pork, and lamb. Lean cuts include:  Extra-lean ground beef.  Arm roast.  Sirloin tip.  Center-cut ham.  Round steak.  Loin chops.  Rump roast.  Tenderloin.  Trim all fat off the outside of meats before cooking. It is not necessary to severely decrease the intake of red meat, but lean choices should be made. Lean meat is rich in protein and contains a highly absorbable form of iron. Premenopausal women, in particular, should avoid reducing lean red meat because this could increase the risk for low red blood  cells (iron-deficiency anemia). The organ meats, such as liver, sweetbreads, kidneys, and brain are very rich in cholesterol. They should be limited. Chicken and [place] These are good sources of protein. The fat of poultry can be reduced by removing the skin and underlying fat layers before cooking. Chicken and [place] can be substituted for lean red meat in the diet. Poultry should not be fried or covered with high-fat sauces. Fish and Shellfish Fish is a good source of protein. Shellfish contain cholesterol, but they usually are low in saturated fatty acids. The preparation of fish is important. Like chicken and [place], they should not be fried or covered with high-fat sauces. EGGS Egg whites contain no fat or cholesterol. They can be eaten often. Try 1 to 2 egg whites instead of whole eggs in recipes or use egg substitutes that do not contain yolk. MILK AND DAIRY PRODUCTS Use skim or 1% milk instead of 2% or whole milk. Decrease whole milk, natural, and processed cheeses. Use nonfat or low-fat (2%) cottage cheese or low-fat cheeses made from vegetable oils. Choose nonfat or low-fat (1 to 2%) yogurt. Experiment with evaporated skim milk in recipes that call for heavy cream. Substitute low-fat yogurt or low-fat cottage cheese for sour cream in dips and salad dressings. Have at least 2 servings of low-fat dairy products, such as 2 glasses of skim (or 1%) milk each day to help get your daily calcium intake.  FATS AND OILS Reduce the total intake of fats, especially saturated fat. Butterfat, lard, and beef fats are high in saturated fat and cholesterol. These should be avoided as much as possible. Vegetable fats do not contain cholesterol, but certain vegetable fats, such as coconut oil, palm oil, and palm kernel oil are very high in saturated fats. These should be limited. These fats are often used in bakery goods, processed foods, popcorn, oils, and nondairy creamers. Vegetable shortenings and some peanut  butters contain hydrogenated oils, which are also saturated fats. Read the labels on these foods and check for saturated vegetable oils. Unsaturated vegetable oils and fats do not raise blood cholesterol. However, they should be limited because they are fats and are high in calories. Total fat should still be limited to 30% of your daily caloric intake. Desirable liquid vegetable oils are corn oil, cottonseed oil, olive oil, canola oil, safflower oil, soybean oil, and sunflower oil. Peanut oil is not as good, but small amounts are acceptable. Buy a heart-healthy tub margarine that has no partially hydrogenated oils in the ingredients. Mayonnaise and salad dressings often are made from unsaturated fats, but they should also be limited because of their high calorie and fat content. Seeds,  nuts, peanut butter, olives, and avocados are high in fat, but the fat is mainly the unsaturated type. These foods should be limited mainly to avoid excess calories and fat. OTHER EATING TIPS Snacks  Most sweets should be limited as snacks. They tend to be rich in calories and fats, and their caloric content outweighs their nutritional value. Some good choices in snacks are graham crackers, melba toast, soda crackers, bagels (no egg), English muffins, fruits, and vegetables. These snacks are preferable to snack crackers, [place] fries, and chips. Popcorn should be air-popped or cooked in small amounts of liquid vegetable oil. Desserts Eat fruit, low-fat yogurt, and fruit ices. AVOID pastries, cake, and cookies. Sherbet, angel food cake, gelatin dessert, frozen low-fat yogurt, or other frozen products that do not contain saturated fat (pure fruit juice bars, frozen ice pops) are also acceptable.  COOKING METHODS Choose those methods that use little or no fat. They include: Poaching.  Braising.  Steaming.  Grilling.  Baking.  Stir-frying.  Broiling.  Microwaving.  Foods can be cooked in a nonstick pan without added  fat, or use a nonfat cooking spray in regular cookware. Limit fried foods and avoid frying in saturated fat. Add moisture to lean meats by using water, broth, cooking wines, and other nonfat or low-fat sauces along with the cooking methods mentioned above. Soups and stews should be chilled after cooking. The fat that forms on top after a few hours in the refrigerator should be skimmed off. When preparing meals, avoid using excess salt. Salt can contribute to raising blood pressure in some people. EATING AWAY FROM HOME Order entres, potatoes, and vegetables without sauces or butter. When meat exceeds the size of a deck of cards (3 to 4 ounces), the rest can be taken home for another meal. Choose vegetable or fruit salads and ask for low-calorie salad dressings to be served on the side. Use dressings sparingly. Limit high-fat toppings, such as bacon, crumbled eggs, cheese, sunflower seeds, and olives. Ask for heart-healthy tub margarine instead of butter.  Hemorrhoids Hemorrhoids are dilated (enlarged) veins around the rectum. Sometimes clots will form in the veins. This makes them swollen and painful. These are called thrombosed hemorrhoids. Causes of hemorrhoids include:  Constipation.   Straining to have a bowel movement.   HEAVY LIFTING HOME CARE INSTRUCTIONS  Eat a well balanced diet and drink 6 to 8 glasses of water every day to avoid constipation. You may also use a bulk laxative.   Avoid straining to have bowel movements.   Keep anal area dry and clean.   Do not use a donut shaped pillow or sit on the toilet for long periods. This increases blood pooling and pain.   Move your bowels when your body has the urge; this will require less straining and will decrease pain and pressure.

## 2013-12-02 NOTE — Op Note (Signed)
[facility] [address] [place], [id number]   COLONOSCOPY PROCEDURE REPORT  PATIENT: Audrey Collier, Dayzee D  MR#: [id number] BIRTHDATE: Sep 03, 1960 , 53  yrs. old GENDER: female ENDOSCOPIST: Jonette EvaSandi Sianna Garofano, MD REFERRED AO:ZHYQMBY:Angus Renard MatterMcInnis, M.D. PROCEDURE DATE:  12/01/2013 PROCEDURE:   Colonoscopy, diagnostic INDICATIONS:iron deficiency anemia.-JUN 2015 FERRITIN 2 MEDICATIONS: Demerol 75 mg IV and Versed 8 mg IV  DESCRIPTION OF PROCEDURE:    Physical exam was performed.  Informed consent was obtained from the patient after explaining the benefits, risks, and alternatives to procedure.  The patient was connected to monitor and placed in left lateral position. Continuous oxygen was provided by nasal cannula and IV medicine administered through an indwelling cannula.  After administration of sedation and rectal exam, the patients rectum was intubated and the EC-[id number] ([id number]  colonoscope was advanced under direct visualization to the ileum.  The scope was removed slowly by carefully examining the color, texture, anatomy, and integrity mucosa on the way out.  The patient was recovered in endoscopy and discharged home in satisfactory condition.    COLON FINDINGS: The examined terminal ileum appeared to be normal. , The LEFT colon IS redundant.  Manual abdominal counter-pressure was used to reach the cecum.  The patient was moved on to their back to reach the cecum, The examination was otherwise normal.  , and Moderate sized internal hemorrhoids were found.  PREP QUALITY: good. CECAL W/D TIME: 12 MINS          COMPLICATIONS: None  ENDOSCOPIC IMPRESSION: 1.   NO SOURCE FOR FEDA IDENTIFIED 2.   The LEFT colon IS redundant 3.   Moderate sized internal hemorrhoids  RECOMMENDATIONS: FOLLOW A HIGH FIBER/LOW FAT DIET.  AVOID ITEMS THAT CAUSE BLOATING.  BIOPSY RESULTS WILL BE BACK IN 14 DAYS. PROCEED TO EGD Next colonoscopy in 10 years WITH AN OVERTUBE/PHENERGAN IN  PREOP.      _______________________________ eSignedJonette Eva:  Kyndell Zeiser, MD 12/02/2013 7:58 AM   CPT CODES: ICD CODES:  The ICD and CPT codes recommended by this software are interpretations from the data that the clinical staff has captured with the software.  The verification of the translation of this report to the ICD and CPT codes and modifiers is the sole responsibility of the health care institution and practicing physician where this report was generated.  [employer] will not be held responsible for the validity of the ICD and CPT codes included on this report.  AMA assumes no liability for data contained or not contained herein. CPT is a Publishing rights managerregistered trademark of the [employer].

## 2013-12-02 NOTE — Op Note (Signed)
[facility] [address] [place], [id number]   ENDOSCOPY PROCEDURE REPORT  PATIENT: Audrey Severeruitt, Audrey Collier  MR#: [id number] BIRTHDATE: 1960-04-12 , 53  yrs. old GENDER: female  ENDOSCOPIST: Jonette EvaSandi Moana Munford, MD REFERRED AO:ZHYQMBY:Angus Renard MatterMcInnis, M.Collier.  PROCEDURE DATE: 12/01/2013 PROCEDURE:   EGD w/ biopsy  INDICATIONS:iron deficiency anemia. MEDICATIONS: TCS+Demerol 25 mg IV and Versed 2 mg IV TOPICAL ANESTHETIC:   Viscous Xylocaine ASA CLASS:  DESCRIPTION OF PROCEDURE:     Physical exam was performed.  Informed consent was obtained from the patient after explaining the benefits, risks, and alternatives to the procedure.  The patient was connected to the monitor and placed in the left lateral position.  Continuous oxygen was provided by nasal cannula and IV medicine administered through an indwelling cannula.  After administration of sedation, the patients esophagus was intubated and the EG-[id number] ([id number]  endoscope was advanced under direct visualization to the second portion of the duodenum.  The scope was removed slowly by carefully examining the color, texture, anatomy, and integrity of the mucosa on the way out.  The patient was recovered in endoscopy and discharged home in satisfactory condition.   ESOPHAGUS: The mucosa of the esophagus appeared normal.   STOMACH: Mild non-erosive gastritis (inflammation) was found in the entire examined stomach.  Multiple biopsies were performed using cold forceps.   DUODENUM: The duodenal mucosa showed no abnormalities in the bulb and 2nd part of the duodenum.  Cold forceps biopsies were taken in the bulb and second portion. COMPLICATIONS: There were no immediate complications.  ENDOSCOPIC IMPRESSION: 1.   NO SOURCE FOR ANEMIA IDENTIFIED 2.   MILD Non-erosive gastritis  RECOMMENDATIONS: FOLLOW A HIGH FIBER/LOW FAT DIET.  AVOID ITEMS THAT CAUSE BLOATING.  BIOPSY RESULTS WILL BE BACK IN 14 DAYS. IF NO SOURCE FOR ANEMIA  IDENTIFIED, PT WILL NEED CAPSULE ENDOSCOPY. Next colonoscopy in 10 years WITH AN OVERTUBE/PHENERGAN IN PREOP.  REPEAT EXAM: _______________________________ eSignedJonette Eva:  Zianna Dercole, MD 12/02/2013 8:04 AM     CPT CODES: ICD CODES:  The ICD and CPT codes recommended by this software are interpretations from the data that the clinical staff has captured with the software.  The verification of the translation of this report to the ICD and CPT codes and modifiers is the sole responsibility of the health care institution and practicing physician where this report was generated.  [employer] will not be held responsible for the validity of the ICD and CPT codes included on this report.  AMA assumes no liability for data contained or not contained herein. CPT is a Publishing rights managerregistered trademark of the [employer].

## 2013-12-04 ENCOUNTER — Encounter (HOSPITAL_COMMUNITY): Payer: Self-pay | Admitting: Gastroenterology

## 2013-12-25 ENCOUNTER — Other Ambulatory Visit: Payer: Self-pay

## 2013-12-25 ENCOUNTER — Telehealth: Payer: Self-pay | Admitting: Gastroenterology

## 2013-12-25 DIAGNOSIS — D509 Iron deficiency anemia, unspecified: Secondary | ICD-10-CM

## 2013-12-25 MED ORDER — OMEPRAZOLE 20 MG PO CPDR: DELAYED_RELEASE_CAPSULE | ORAL | Status: DC

## 2013-12-25 MED ORDER — CLARITHROMYCIN 500 MG PO TABS: ORAL_TABLET | ORAL | Status: DC

## 2013-12-25 MED ORDER — AMOXICILLIN 500 MG PO TABS: ORAL_TABLET | ORAL | Status: DC

## 2013-12-25 NOTE — Telephone Encounter (Signed)
Reminders in epic °

## 2013-12-25 NOTE — Telephone Encounter (Signed)
LM for Bjorn LoserRhonda to return call. ( Lab order on file for 03/2014).

## 2013-12-25 NOTE — Telephone Encounter (Signed)
PLEASE CALL PT'S FAMILY. Her stomach Bx showed H. Pylori infection. She needs AMOXICILLIN 500 mg 2 po BID for 10 days and Biaxin 500 mg po bid for 10 days. She needs Omeprazole 20 mg BID for 10 days then 1 po DAILY for 3 mos. Med side effects include NVD, abd pain, and metallic taste.SHE SHOULD HOLD ALLEGRA WHILE TAKING ABX.  H PYLORI GASTRITIS CAN CAUSE LOW IRON AND BLOOD COUNT. SHE SHOULD HAVE A REPEAT CBC/FERRITIN 1 WEEK PRIOR TO HR NEXT VISIT IN 3 MOS E30 ANEMIA/H PYLORI GASTRITIS. NEXT TCS IN 10 YEARS.

## 2013-12-27 NOTE — Telephone Encounter (Signed)
Audrey LoserRhonda is aware of results and medications and to hold the Allegra while taking the antibiotics.  Lab order on file for Jan 2016.

## 2014-01-03 ENCOUNTER — Other Ambulatory Visit: Payer: Self-pay | Admitting: Adult Health

## 2014-01-03 DIAGNOSIS — Z09 Encounter for follow-up examination after completed treatment for conditions other than malignant neoplasm: Secondary | ICD-10-CM

## 2014-01-17 ENCOUNTER — Encounter (HOSPITAL_COMMUNITY): Payer: Medicaid Other

## 2014-01-23 ENCOUNTER — Ambulatory Visit (HOSPITAL_COMMUNITY)
Admission: RE | Admit: 2014-01-23 | Discharge: 2014-01-23 | Disposition: A | Discharge status: Home / Self Care | Payer: Medicaid Other | Source: Ambulatory Visit | Attending: Adult Health | Admitting: Adult Health

## 2014-01-23 DIAGNOSIS — N6489 Other specified disorders of breast: Secondary | ICD-10-CM | POA: Diagnosis not present

## 2014-01-23 DIAGNOSIS — Z09 Encounter for follow-up examination after completed treatment for conditions other than malignant neoplasm: Secondary | ICD-10-CM

## 2014-02-19 ENCOUNTER — Encounter: Payer: Self-pay | Admitting: Gastroenterology

## 2014-02-27 ENCOUNTER — Other Ambulatory Visit: Payer: Self-pay

## 2014-02-27 DIAGNOSIS — D509 Iron deficiency anemia, unspecified: Secondary | ICD-10-CM

## 2014-03-13 LAB — CBC WITH DIFFERENTIAL/PLATELET
BASOS ABS: 0 10*3/uL (ref 0.0–0.1)
Basophils Relative: 0 % (ref 0–1)
Eosinophils Absolute: 0.2 10*3/uL (ref 0.0–0.7)
Eosinophils Relative: 2 % (ref 0–5)
HCT: 42.5 % (ref 36.0–46.0)
Hemoglobin: 14.2 g/dL (ref 12.0–15.0)
Lymphocytes Relative: 18 % (ref 12–46)
Lymphs Abs: 1.4 10*3/uL (ref 0.7–4.0)
MCH: 28.8 pg (ref 26.0–34.0)
MCHC: 33.4 g/dL (ref 30.0–36.0)
MCV: 86.2 fL (ref 78.0–100.0)
MPV: 10.4 fL (ref 8.6–12.4)
Monocytes Absolute: 0.5 10*3/uL (ref 0.1–1.0)
Monocytes Relative: 7 % (ref 3–12)
NEUTROS ABS: 5.5 10*3/uL (ref 1.7–7.7)
Neutrophils Relative %: 73 % (ref 43–77)
PLATELETS: 281 10*3/uL (ref 150–400)
RBC: 4.93 MIL/uL (ref 3.87–5.11)
RDW: 14.1 % (ref 11.5–15.5)
WBC: 7.6 10*3/uL (ref 4.0–10.5)

## 2014-03-13 LAB — FERRITIN: Ferritin: 23 ng/mL (ref 10–291)

## 2014-03-16 ENCOUNTER — Other Ambulatory Visit: Payer: Medicaid Other | Admitting: Adult Health

## 2014-03-26 ENCOUNTER — Ambulatory Visit (INDEPENDENT_AMBULATORY_CARE_PROVIDER_SITE_OTHER): Payer: Medicaid Other | Admitting: Adult Health

## 2014-03-26 ENCOUNTER — Other Ambulatory Visit: Payer: Self-pay | Admitting: Adult Health

## 2014-03-26 ENCOUNTER — Ambulatory Visit (INDEPENDENT_AMBULATORY_CARE_PROVIDER_SITE_OTHER): Payer: Medicaid Other

## 2014-03-26 ENCOUNTER — Encounter: Payer: Self-pay | Admitting: Adult Health

## 2014-03-26 VITALS — BP 134/80 | Ht 65.0 in | Wt 176.0 lb

## 2014-03-26 DIAGNOSIS — R9389 Abnormal findings on diagnostic imaging of other specified body structures: Secondary | ICD-10-CM

## 2014-03-26 DIAGNOSIS — N840 Polyp of corpus uteri: Secondary | ICD-10-CM

## 2014-03-26 DIAGNOSIS — R102 Pelvic and perineal pain: Secondary | ICD-10-CM

## 2014-03-26 DIAGNOSIS — R938 Abnormal findings on diagnostic imaging of other specified body structures: Secondary | ICD-10-CM

## 2014-03-26 DIAGNOSIS — N950 Postmenopausal bleeding: Secondary | ICD-10-CM

## 2014-03-26 HISTORY — DX: Postmenopausal bleeding: N95.0

## 2014-03-26 HISTORY — DX: Polyp of corpus uteri: N84.0

## 2014-03-26 HISTORY — DX: Abnormal findings on diagnostic imaging of other specified body structures: R93.89

## 2014-03-26 NOTE — Patient Instructions (Signed)
Needs to come back next week for US with Dr Despina HiddenEure to see if polyp, has thickened endometrium [name] call me in am

## 2014-03-26 NOTE — Progress Notes (Signed)
Subjective:     Patient ID: Audrey Collier, female   DOB: 04/03/1960, 54 y.o.   MRN: [id number]  HPI Audrey Collier is a 54 year old white female,single, in for [place] for ?PMB.She has mild MR and lives with brother and his wife.  Review of Systems See HPI Reviewed past medical,surgical, social and family history. Reviewed medications and allergies.     Objective:   Physical Exam BP 134/80 mmHg  Ht 5\' 5"  (1.651 m)  Wt 176 lb (79.833 kg)  BMI 29.29 kg/m2  LMP 04/16/2013 (Approximate) Uterus 6.1 x 4.5 x 3.2 cm, anteverted  Endometrium 11 mm, with focal area of thickening noted ?polyp= 8.305mm  Right ovary 2.2 x 1.4 x 1.3 cm,   Left ovary 2.0 x 1.5 x 1.6 cm,   No free fluid or adnexal masses noted within the pelvis  Technician Comments:  Anteverted uterus noted with focal area of thickening noted within the endometrium (?polyp), bilateral adnexa/ovaries appear WNL, no free fluid or adnexal masses noted within the pelvis US reviewed with pt.    Assessment:     PMB Thickened endometrium ? Endometrial polyp     Plan:    Get Audrey LoserRhonda to call me in am Check Audrey Collier, Audrey CollierFSH Return 2/2 at 11:15 am for HSG with Audrey Collier

## 2014-03-27 ENCOUNTER — Telehealth: Payer: Self-pay | Admitting: Adult Health

## 2014-03-27 LAB — FOLLICLE STIMULATING HORMONE: FSH: 80.9 m[IU]/mL

## 2014-03-27 NOTE — Telephone Encounter (Signed)
[name] aware FSH 80.9 and that [name] has appt 2/2 at 11:15 for HSG with Dr Despina HiddenEure

## 2014-04-03 ENCOUNTER — Encounter: Payer: Self-pay | Admitting: Obstetrics & Gynecology

## 2014-04-03 ENCOUNTER — Other Ambulatory Visit: Payer: Medicaid Other

## 2014-04-03 ENCOUNTER — Ambulatory Visit (INDEPENDENT_AMBULATORY_CARE_PROVIDER_SITE_OTHER): Payer: Medicaid Other | Admitting: Obstetrics & Gynecology

## 2014-04-03 VITALS — BP 130/90 | Wt 175.0 lb

## 2014-04-03 DIAGNOSIS — N950 Postmenopausal bleeding: Secondary | ICD-10-CM

## 2014-04-03 DIAGNOSIS — N840 Polyp of corpus uteri: Secondary | ICD-10-CM

## 2014-04-03 NOTE — Progress Notes (Signed)
Patient ID: Audrey Collier, female   DOB: 1960-04-03, 54 y.o.   MRN: [id number] Preoperative History and Physical  Audrey Collier is a 54 y.o. G0P0000 with Patient's last menstrual period was 04/16/2013 (approximate). admitted for a evaluation and removal of endometrial polyp and treatment for perimenopausal bleeding.    PMH:    Past Medical History  Diagnosis Date  . Mental disorder     mentally challenged  . Fecal occult blood test positive 03/15/2013    will send 3 cards home and refer to GI for colonoscopy  . PMB (postmenopausal bleeding) 03/26/2014  . Thickened endometrium 03/26/2014    ?polyp will get HSG  . Endometrial polyp 03/26/2014    PSH:     Past Surgical History  Procedure Laterality Date  . No past surgeries    . Colonoscopy N/A 12/01/2013    Procedure: COLONOSCOPY;  Surgeon: West Bali, MD;  [facility]: [facility];  Service: Endoscopy;  Laterality: N/A;  10:30 - moved to 11:00 - Ginger to notify pt  . Esophagogastroduodenoscopy N/A 12/01/2013    Procedure: ESOPHAGOGASTRODUODENOSCOPY (EGD);  Surgeon: West Bali, MD;  Location: [facility];  Service: Endoscopy;  Laterality: N/A;    POb/GynH:      OB History    Gravida Para Term Preterm AB TAB SAB Ectopic Multiple Living        SH:   History  Substance Use Topics  . Smoking status: Never Smoker   . Smokeless tobacco: Never Used  . Alcohol Use: No    FH:    Family History  Problem Relation Age of Onset  . Diabetes Maternal Aunt   . Hypertension Maternal Aunt   . Diabetes Maternal Uncle   . Colon cancer Neg Hx      Allergies: No Known Allergies  Medications:       Current outpatient prescriptions:  .  calcium-vitamin D 250-100 MG-UNIT per tablet, Take 1 tablet by mouth 2 (two) times daily. Unknown amount taken by patient., Disp: , Rfl:  .  fexofenadine (ALLEGRA) 180 MG tablet, Take 180 mg by mouth daily., Disp: , Rfl:  .  Linaclotide (LINZESS) 290 MCG CAPS capsule, Take 1  capsule (290 mcg total) by mouth daily., Disp: 30 capsule, Rfl: 11 .  Multiple Vitamin (MULTIVITAMIN) tablet, Take 1 tablet by mouth daily., Disp: , Rfl:  .  NON FORMULARY, IRON  65 MG    TAKES ONE TABLET DAILY, Disp: , Rfl:  .  omeprazole (PRILOSEC) 20 MG capsule, 1 po bid FOR 10 DAYS THEN ONE DAILY FOR 3 mos., Disp: 60 capsule, Rfl: 3 .  amoxicillin (AMOXIL) 500 MG tablet, 2 PO BID FOR 10 DAYS (Patient not taking: Reported on 04/03/2014), Disp: 40 tablet, Rfl: 0 .  clarithromycin (BIAXIN) 500 MG tablet, 1 PO BID FOR 10 DAYS. (Patient not taking: Reported on 04/03/2014), Disp: 20 tablet, Rfl: 0  Review of Systems:   Review of Systems  Constitutional: Negative for fever, chills, weight loss, malaise/fatigue and diaphoresis.  HENT: Negative for hearing loss, ear pain, nosebleeds, congestion, sore throat, neck pain, tinnitus and ear discharge.   Eyes: Negative for blurred vision, double vision, photophobia, pain, discharge and redness.  Respiratory: Negative for cough, hemoptysis, sputum production, shortness of breath, wheezing and stridor.   Cardiovascular: Negative for chest pain, palpitations, orthopnea, claudication, leg swelling and PND.  Gastrointestinal: Positive for abdominal pain. Negative for heartburn, nausea, vomiting, diarrhea, constipation,  blood in stool and melena.  Genitourinary: Negative for dysuria, urgency, frequency, hematuria and flank pain.  Musculoskeletal: Negative for myalgias, back pain, joint pain and falls.  Skin: Negative for itching and rash.  Neurological: Negative for dizziness, tingling, tremors, sensory change, speech change, focal weakness, seizures, loss of consciousness, weakness and headaches.  Endo/Heme/Allergies: Negative for environmental allergies and polydipsia. Does not bruise/bleed easily.  Psychiatric/Behavioral: Negative for depression, suicidal ideas, hallucinations, memory loss and substance abuse. The patient is not nervous/anxious and does not have  insomnia.      PHYSICAL EXAM:  Blood pressure 130/90, weight 175 lb (79.379 kg), last menstrual period 04/16/2013.    Vitals reviewed. Constitutional: She is oriented to person, place, and time. She appears well-developed and well-nourished.  HENT:  Head: Normocephalic and atraumatic.  Right Ear: External ear normal.  Left Ear: External ear normal.  Nose: Nose normal.  Mouth/Throat: Oropharynx is clear and moist.  Eyes: Conjunctivae and EOM are normal. Pupils are equal, round, and reactive to light. Right eye exhibits no discharge. Left eye exhibits no discharge. No scleral icterus.  Neck: Normal range of motion. Neck supple. No tracheal deviation present. No thyromegaly present.  Cardiovascular: Normal rate, regular rhythm, normal heart sounds and intact distal pulses.  Exam reveals no gallop and no friction rub.   No murmur heard. Respiratory: Effort normal and breath sounds normal. No respiratory distress. She has no wheezes. She has no rales. She exhibits no tenderness.  GI: Soft. Bowel sounds are normal. She exhibits no distension and no mass. There is tenderness. There is no rebound and no guarding.  Genitourinary:       Vulva is normal without lesions Vagina is pink moist without discharge Cervix normal in appearance and pap is normal Uterus is normal  Adnexa is negative with normal sized ovaries by sonogram  Musculoskeletal: Normal range of motion. She exhibits no edema and no tenderness.  Neurological: She is alert and oriented to person, place, and time. She has normal reflexes. She displays normal reflexes. No cranial nerve deficit. She exhibits normal muscle tone. Coordination normal.  Skin: Skin is warm and dry. No rash noted. No erythema. No pallor.  Psychiatric: She has a normal mood and affect. Her behavior is normal. Judgment and thought content normal.    Labs: Results for orders placed or performed in visit on 03/26/14 (from the past 336 hour(s))  Follicle  stimulating hormone   Collection Time: 03/26/14  3:18 PM  Result Value Ref Range   FSH 80.9 mIU/mL    EKG: No orders found for this or any previous visit.  Imaging Studies: [place] Pelvis Complete  04/03/2014   GYNECOLOGIC SONOGRAM   Audrey Collier D Priore is a 54 y.o. No obstetric history on file. for a pelvic  sonogram for post menopausal bleeding.  Uterus                      6.1 x 4.5 x 3.2 cm, anteverted  Endometrium          11 mm, with focal area of thickening noted ?polyp=  8.755mm  Right ovary             2.2 x 1.4 x 1.3 cm,   Left ovary                2.0 x 1.5 x 1.6 cm,   No free fluid or adnexal masses noted within the pelvis  Technician Comments:  Anteverted uterus noted with focal  area of thickening noted within the  endometrium (?polyp), bilateral adnexa/ovaries appear WNL, no free fluid  or adnexal masses noted within the pelvis   Chari Manning 03/26/2014 2:57 PM   Clinical Impression and recommendations:  I have reviewed the sonogram results above, combined with the patient's  current clinical course, below are my impressions and any appropriate  recommendations for management based on the sonographic findings.  Thickened endometrium with a separate mass, most likely a polyp Sonogram otherwise normal   EURE,LUTHER H 04/03/2014 10:29 AM       Assessment: Patient Active Problem List   Diagnosis Date Noted  . PMB (postmenopausal bleeding) 03/26/2014  . Thickened endometrium 03/26/2014  . Endometrial polyp 03/26/2014  . Unspecified constipation 04/11/2013  . Fecal occult blood test positive 03/15/2013    Plan: Hysteroscopy uerine curettage endometrial curettage  EURE,LUTHER H 04/03/2014 11:18 AM

## 2014-04-05 ENCOUNTER — Telehealth: Payer: Self-pay | Admitting: Gastroenterology

## 2014-04-05 NOTE — Telephone Encounter (Signed)
PLEASE CALL PT. HER BLOOD COUNT AND IRON STORES ARE NORMAL. OPV 6 MOS E30 ANEMIA/H PYLORI GASTRITIS.

## 2014-04-05 NOTE — Telephone Encounter (Signed)
ON RECALL LIST  °

## 2014-04-12 NOTE — Telephone Encounter (Signed)
[facility].

## 2014-04-13 ENCOUNTER — Encounter (HOSPITAL_COMMUNITY)
Admission: RE | Admit: 2014-04-13 | Discharge: 2014-04-13 | Disposition: A | Discharge status: Home / Self Care | Payer: Medicaid Other | Source: Ambulatory Visit | Attending: Obstetrics & Gynecology | Admitting: Obstetrics & Gynecology

## 2014-04-13 ENCOUNTER — Encounter (HOSPITAL_COMMUNITY): Payer: Self-pay

## 2014-04-13 DIAGNOSIS — N950 Postmenopausal bleeding: Secondary | ICD-10-CM | POA: Diagnosis not present

## 2014-04-13 DIAGNOSIS — N840 Polyp of corpus uteri: Secondary | ICD-10-CM | POA: Insufficient documentation

## 2014-04-13 DIAGNOSIS — Z01818 Encounter for other preprocedural examination: Secondary | ICD-10-CM | POA: Diagnosis present

## 2014-04-13 HISTORY — DX: Dizziness and giddiness: R42

## 2014-04-13 HISTORY — DX: Gastro-esophageal reflux disease without esophagitis: K21.9

## 2014-04-13 LAB — CBC
HCT: 43.8 % (ref 36.0–46.0)
Hemoglobin: 14.3 g/dL (ref 12.0–15.0)
MCH: 28.8 pg (ref 26.0–34.0)
MCHC: 32.6 g/dL (ref 30.0–36.0)
MCV: 88.3 fL (ref 78.0–100.0)
Platelets: 267 10*3/uL (ref 150–400)
RBC: 4.96 MIL/uL (ref 3.87–5.11)
RDW: 13.5 % (ref 11.5–15.5)
WBC: 8 10*3/uL (ref 4.0–10.5)

## 2014-04-13 LAB — URINALYSIS, ROUTINE W REFLEX MICROSCOPIC
Bilirubin Urine: NEGATIVE
Glucose, UA: NEGATIVE mg/dL
KETONES UR: NEGATIVE mg/dL
LEUKOCYTES UA: NEGATIVE
Nitrite: NEGATIVE
PH: 6 (ref 5.0–8.0)
Protein, ur: 30 mg/dL — AB
Specific Gravity, Urine: <1.005 — ABNORMAL LOW (ref 1.005–1.030)
UROBILINOGEN UA: 0.2 mg/dL (ref 0.0–1.0)

## 2014-04-13 LAB — COMPREHENSIVE METABOLIC PANEL
ALT: 38 U/L — ABNORMAL HIGH (ref 0–35)
ANION GAP: 7 (ref 5–15)
AST: 30 U/L (ref 0–37)
Albumin: 3.7 g/dL (ref 3.5–5.2)
Alkaline Phosphatase: 67 U/L (ref 39–117)
BUN: 9 mg/dL (ref 6–23)
CALCIUM: 9.8 mg/dL (ref 8.4–10.5)
CO2: 28 mmol/L (ref 19–32)
CREATININE: 0.66 mg/dL (ref 0.50–1.10)
Chloride: 106 mmol/L (ref 96–112)
GFR calc non Af Amer: >90 mL/min (ref >90)
GLUCOSE: 102 mg/dL — AB (ref 70–99)
Potassium: 3.8 mmol/L (ref 3.5–5.1)
Sodium: 141 mmol/L (ref 135–145)
TOTAL PROTEIN: 6.6 g/dL (ref 6.0–8.3)
Total Bilirubin: 0.5 mg/dL (ref 0.3–1.2)

## 2014-04-13 LAB — URINE MICROSCOPIC-ADD ON

## 2014-04-13 LAB — HCG, QUANTITATIVE, PREGNANCY: hCG, Beta Chain, Quant, S: 2 m[IU]/mL (ref <5)

## 2014-04-13 NOTE — Patient Instructions (Signed)
Audrey SevereSheila D Collier  04/13/2014   Your procedure is scheduled on:  04/18/2014  [facility]  [facility].  Call this number if you have problems the morning of surgery: [phone]   Remember:   Do not eat food or drink liquids after midnight.   Take these medicines the morning of surgery with A SIP OF WATER: prilosec, allegra   Do not wear jewelry, make-up or nail polish.  Do not wear lotions, powders, or perfumes.   Do not shave 48 hours prior to surgery. Men may shave face and neck.  Do not bring valuables to the hospital.  [facility] not [facility] for any belongings or valuables.               Contacts, dentures or bridgework may not be worn into surgery.  Leave suitcase in the car. After surgery it may be brought to your room.  For patients admitted to the hospital, discharge time is determined by your treatment team.               Patients discharged the day of surgery will not be allowed to drive home.  Name and phone number of your driver: family  Special Instructions: Shower using CHG 2 nights before surgery and the night before surgery.  If you shower the day of surgery use CHG.  Use special wash - you have one bottle of CHG for all showers.  You should use approximately 1/3 of the bottle for each shower.   Please read over the following fact sheets that you were given: Pain Booklet, Coughing and Deep Breathing, Surgical Site Infection Prevention, Anesthesia Post-op Instructions and Care and Recovery After Surgery Endometrial Ablation Endometrial ablation removes the lining of the uterus (endometrium). It is usually a same-day, outpatient treatment. Ablation helps avoid major surgery, such as surgery to remove the cervix and uterus (hysterectomy). After endometrial ablation, you will have little or no menstrual bleeding and may not be able to have children. However, if you are premenopausal, you will need to use a reliable method of birth control following  the procedure because of the small chance that pregnancy can occur. There are different reasons to have this procedure, which include:  Heavy periods.  Bleeding that is causing anemia.  Irregular bleeding.  Bleeding fibroids on the lining inside the uterus if they are smaller than 3 centimeters. This procedure should not be done if:  You want children in the future.  You have Collier cramps with your menstrual period.  You have precancerous or cancerous cells in your uterus.  You were recently pregnant.  You have gone through menopause.  You have had major surgery on the uterus, such as a cesarean delivery. LET [facility] KNOW ABOUT:  Any allergies you have.  All medicines you are taking, including vitamins, herbs, eye drops, creams, and over-the-counter medicines.  Previous problems you or members of your family have had with the use of anesthetics.  Any blood disorders you have.  Previous surgeries you have had.  Medical conditions you have. RISKS AND COMPLICATIONS  Generally, this is a safe procedure. However, as with any procedure, complications can occur. Possible complications include:  Perforation of the uterus.  Bleeding.  Infection of the uterus, bladder, or vagina.  Injury to surrounding organs.  An air bubble to the lung (air embolus).  Pregnancy following the procedure.  Failure of the procedure to help  the problem, requiring hysterectomy.  Decreased ability to diagnose cancer in the lining of the uterus. BEFORE THE PROCEDURE  The lining of the uterus must be tested to make sure there is no pre-cancerous or cancer cells present.  An ultrasound may be performed to look at the size of the uterus and to check for abnormalities.  Medicines may be given to thin the lining of the uterus. PROCEDURE  During the procedure, your health care provider will use a tool called a resectoscope to help see inside your uterus. There are different  ways to remove the lining of your uterus.   Radiofrequency - This method uses a radiofrequency-alternating electric current to remove the lining of the uterus.  Cryotherapy - This method uses extreme cold to freeze the lining of the uterus.  Heated-Free Liquid - This method uses heated salt (saline) solution to remove the lining of the uterus.  Microwave - This method uses high-energy microwaves to heat up the lining of the uterus to remove it.  Thermal balloon - This method involves inserting a catheter with a balloon tip into the uterus. The balloon tip is filled with heated fluid to remove the lining of the uterus. AFTER THE PROCEDURE  After your procedure, do not have sexual intercourse or insert anything into your vagina until permitted by your health care provider. After the procedure, you may experience:  Cramps.  Vaginal discharge.  Frequent urination. Document Released: 12/27/2003 Document Revised: 10/19/2012 Document Reviewed: 07/20/2012 [facility] Patient Information 2015 [place], [place]. This information is not intended to replace advice given to you by your health care provider. Make sure you discuss any questions you have with your health care provider. Hysteroscopy Hysteroscopy is a procedure used for looking inside the womb (uterus). It may be done for various reasons, including:  To evaluate abnormal bleeding, fibroid (benign, noncancerous) tumors, polyps, scar tissue (adhesions), and possibly cancer of the uterus.  To look for lumps (tumors) and other uterine growths.  To look for causes of why a woman cannot get pregnant (infertility), causes of recurrent loss of pregnancy (miscarriages), or a lost intrauterine device (IUD).  To perform a sterilization by blocking the fallopian tubes from inside the uterus. In this procedure, a thin, flexible tube with a tiny light and camera on the end of it (hysteroscope) is used to look inside the uterus. A hysteroscopy should be  done right after a menstrual period to be sure you are not pregnant. LET [facility] CARE PROVIDER KNOW ABOUT:   Any allergies you have.  All medicines you are taking, including vitamins, herbs, eye drops, creams, and over-the-counter medicines.  Previous problems you or members of your family have had with the use of anesthetics.  Any blood disorders you have.  Previous surgeries you have had.  Medical conditions you have. RISKS AND COMPLICATIONS  Generally, this is a safe procedure. However, as with any procedure, complications can occur. Possible complications include:  Putting a hole in the uterus.  Excessive bleeding.  Infection.  Damage to the cervix.  Injury to other organs.  Allergic reaction to medicines.  Too much fluid used in the uterus for the procedure. BEFORE THE PROCEDURE   Ask your health care provider about changing or stopping any regular medicines.  Do not take aspirin or blood thinners for 1 week before the procedure, or as directed by your health care provider. These can cause bleeding.  If you smoke, do not smoke for 2 weeks before the procedure.  In  some cases, a medicine is placed in the cervix the day before the procedure. This medicine makes the cervix have a larger opening (dilate). This makes it easier for the instrument to be inserted into the uterus during the procedure.  Do not eat or drink anything for at least 8 hours before the surgery.  Arrange for someone to take you home after the procedure. PROCEDURE   You may be given a medicine to relax you (sedative). You may also be given one of the following:  A medicine that numbs the area around the cervix (local anesthetic).  A medicine that makes you sleep through the procedure (general anesthetic).  The hysteroscope is inserted through the vagina into the uterus. The camera on the hysteroscope sends a picture to a TV screen. This gives the surgeon a good view inside the  uterus.  During the procedure, air or a liquid is put into the uterus, which allows the surgeon to see better.  Sometimes, tissue is gently scraped from inside the uterus. These tissue samples are sent to a lab for testing. AFTER THE PROCEDURE   If you had a general anesthetic, you may be groggy for a couple hours after the procedure.  If you had a local anesthetic, you will be able to go home as soon as you are stable and feel ready.  You may have some cramping. This normally lasts for a couple days.  You may have bleeding, which varies from light spotting for a few days to menstrual-like bleeding for 3-7 days. This is normal.  If your test results are not back during the visit, make an appointment with your health care provider to find out the results. Document Released: 05/25/2000 Document Revised: 12/07/2012 Document Reviewed: 09/15/2012 [facility] Patient Information 2015 [place], [place]. This information is not intended to replace advice given to you by your health care provider. Make sure you discuss any questions you have with your health care provider. Dilation and Curettage or Vacuum Curettage Dilation and curettage (D&C) and vacuum curettage are minor procedures. A D&C involves stretching (dilation) the cervix and scraping (curettage) the inside lining of the womb (uterus). During a D&C, tissue is gently scraped from the inside lining of the uterus. During a vacuum curettage, the lining and tissue in the uterus are removed with the use of gentle suction.  Curettage may be performed to either diagnose or treat a problem. As a diagnostic procedure, curettage is performed to examine tissues from the uterus. A diagnostic curettage may be performed for the following symptoms:   Irregular bleeding in the uterus.   Bleeding with the development of clots.   Spotting between menstrual periods.   Prolonged menstrual periods.   Bleeding after menopause.   No menstrual period  (amenorrhea).   A change in size and shape of the uterus.  As a treatment procedure, curettage may be performed for the following reasons:   Removal of an IUD (intrauterine device).   Removal of retained placenta after giving birth. Retained placenta can cause an infection or bleeding Collier enough to require transfusions.   Abortion.   Miscarriage.   Removal of polyps inside the uterus.   Removal of uncommon types of noncancerous lumps (fibroids).  LET [facility] CARE PROVIDER KNOW ABOUT:   Any allergies you have.   All medicines you are taking, including vitamins, herbs, eye drops, creams, and over-the-counter medicines.   Previous problems you or members of your family have had with the use of anesthetics.  Any blood disorders you have.   Previous surgeries you have had.   Medical conditions you have. RISKS AND COMPLICATIONS  Generally, this is a safe procedure. However, as with any procedure, complications can occur. Possible complications include:  Excessive bleeding.   Infection of the uterus.   Damage to the cervix.   Development of scar tissue (adhesions) inside the uterus, later causing abnormal amounts of menstrual bleeding.   Complications from the general anesthetic, if a general anesthetic is used.   Putting a hole (perforation) in the uterus. This is rare.  BEFORE THE PROCEDURE   Eat and drink before the procedure only as directed by your health care provider.   Arrange for someone to take you home.  PROCEDURE  This procedure usually takes about 15-30 minutes.  You will be given one of the following:  A medicine that numbs the area in and around the cervix (local anesthetic).   A medicine to make you sleep through the procedure (general anesthetic).  You will lie on your back with your legs in stirrups.   A warm metal or plastic instrument (speculum) will be placed in your vagina to keep it open and to allow the health  care provider to see the cervix.  There are two ways in which your cervix can be softened and dilated. These include:   Taking a medicine.   Having thin rods (laminaria) inserted into your cervix.   A curved tool (curette) will be used to scrape cells from the inside lining of the uterus. In some cases, gentle suction is applied with the curette. The curette will then be removed.  AFTER THE PROCEDURE   You will rest in the recovery area until you are stable and are ready to go home.   You may feel sick to your stomach (nauseous) or throw up (vomit) if you were given a general anesthetic.   You may have a sore throat if a tube was placed in your throat during general anesthesia.   You may have light cramping and bleeding. This may last for 2 days to 2 weeks after the procedure.   Your uterus needs to make a new lining after the procedure. This may make your next period late. Document Released: 02/16/2005 Document Revised: 10/19/2012 Document Reviewed: 09/15/2012 [facility] Patient Information 2015 [place], [place]. This information is not intended to replace advice given to you by your health care provider. Make sure you discuss any questions you have with your health care provider. PATIENT INSTRUCTIONS POST-ANESTHESIA  IMMEDIATELY FOLLOWING SURGERY:  Do not drive or operate machinery for the first twenty four hours after surgery.  Do not make any important decisions for twenty four hours after surgery or while taking narcotic pain medications or sedatives.  If you develop intractable nausea and vomiting or a Collier headache please notify your doctor immediately.  FOLLOW-UP:  Please make an appointment with your surgeon as instructed. You do not need to follow up with anesthesia unless specifically instructed to do so.  WOUND CARE INSTRUCTIONS (if applicable):  Keep a dry clean dressing on the anesthesia/puncture wound site if there is drainage.  Once the wound has quit draining you  may leave it open to air.  Generally you should leave the bandage intact for twenty four hours unless there is drainage.  If the epidural site drains for more than 36-48 hours please call the anesthesia department.  QUESTIONS?:  Please feel free to call your physician or the hospital operator if you have  any questions, and they will be happy to assist you.

## 2014-04-18 ENCOUNTER — Other Ambulatory Visit: Payer: Self-pay

## 2014-04-18 ENCOUNTER — Ambulatory Visit (HOSPITAL_COMMUNITY): Payer: Medicaid Other | Admitting: Anesthesiology

## 2014-04-18 ENCOUNTER — Telehealth: Payer: Self-pay | Admitting: Obstetrics & Gynecology

## 2014-04-18 ENCOUNTER — Encounter (HOSPITAL_COMMUNITY)
Admission: RE | Disposition: A | Discharge status: Home / Self Care | Payer: Self-pay | Source: Ambulatory Visit | Attending: Obstetrics & Gynecology

## 2014-04-18 ENCOUNTER — Ambulatory Visit (HOSPITAL_COMMUNITY)
Admission: RE | Admit: 2014-04-18 | Discharge: 2014-04-18 | Disposition: A | Discharge status: Home / Self Care | Payer: Medicaid Other | Source: Ambulatory Visit | Attending: Obstetrics & Gynecology | Admitting: Obstetrics & Gynecology

## 2014-04-18 DIAGNOSIS — K5900 Constipation, unspecified: Secondary | ICD-10-CM | POA: Insufficient documentation

## 2014-04-18 DIAGNOSIS — N840 Polyp of corpus uteri: Secondary | ICD-10-CM | POA: Insufficient documentation

## 2014-04-18 DIAGNOSIS — N950 Postmenopausal bleeding: Secondary | ICD-10-CM | POA: Insufficient documentation

## 2014-04-18 DIAGNOSIS — R938 Abnormal findings on diagnostic imaging of other specified body structures: Secondary | ICD-10-CM | POA: Diagnosis not present

## 2014-04-18 DIAGNOSIS — Z79899 Other long term (current) drug therapy: Secondary | ICD-10-CM | POA: Insufficient documentation

## 2014-04-18 DIAGNOSIS — K219 Gastro-esophageal reflux disease without esophagitis: Secondary | ICD-10-CM | POA: Insufficient documentation

## 2014-04-18 DIAGNOSIS — Z8659 Personal history of other mental and behavioral disorders: Secondary | ICD-10-CM | POA: Insufficient documentation

## 2014-04-18 HISTORY — PX: POLYPECTOMY: SHX5525

## 2014-04-18 HISTORY — PX: DILITATION & CURRETTAGE/HYSTROSCOPY WITH NOVASURE ABLATION: SHX5568

## 2014-04-18 SURGERY — DILATATION & CURETTAGE/HYSTEROSCOPY WITH NOVASURE ABLATION
Anesthesia: General

## 2014-04-18 MED ORDER — FENTANYL CITRATE 0.05 MG/ML IJ SOLN
25.0000 ug | INTRAMUSCULAR | Status: DC | PRN
  Administered 2014-04-18: 50 ug via INTRAVENOUS
  Administered 2014-04-18: 25 ug via INTRAVENOUS
  Filled 2014-04-18: qty 2

## 2014-04-18 MED ORDER — SODIUM CHLORIDE 0.9 % IR SOLN
Status: DC | PRN
  Administered 2014-04-18: 1000 mL

## 2014-04-18 MED ORDER — LIDOCAINE HCL (PF) 1 % IJ SOLN
INTRAMUSCULAR | Status: AC
  Filled 2014-04-18: qty 5

## 2014-04-18 MED ORDER — MIDAZOLAM HCL 2 MG/2ML IJ SOLN
1.0000 mg | INTRAMUSCULAR | Status: DC | PRN
  Administered 2014-04-18: 2 mg via INTRAVENOUS

## 2014-04-18 MED ORDER — LIDOCAINE HCL 1 % IJ SOLN
INTRAMUSCULAR | Status: DC | PRN
  Administered 2014-04-18: 25 mg via INTRADERMAL

## 2014-04-18 MED ORDER — ONDANSETRON HCL 4 MG/2ML IJ SOLN
INTRAMUSCULAR | Status: AC
  Filled 2014-04-18: qty 2

## 2014-04-18 MED ORDER — PROPOFOL 10 MG/ML IV BOLUS
INTRAVENOUS | Status: DC | PRN
  Administered 2014-04-18: 130 mg via INTRAVENOUS

## 2014-04-18 MED ORDER — KETOROLAC TROMETHAMINE 30 MG/ML IJ SOLN
30.0000 mg | Freq: Once | INTRAMUSCULAR | Status: AC
  Administered 2014-04-18: 30 mg via INTRAVENOUS
  Filled 2014-04-18: qty 1

## 2014-04-18 MED ORDER — FENTANYL CITRATE 0.05 MG/ML IJ SOLN
INTRAMUSCULAR | Status: AC
  Filled 2014-04-18: qty 2

## 2014-04-18 MED ORDER — OMEPRAZOLE 20 MG PO CPDR: 20.0000 mg | DELAYED_RELEASE_CAPSULE | Freq: Every day | ORAL | Status: DC

## 2014-04-18 MED ORDER — MIDAZOLAM HCL 2 MG/2ML IJ SOLN
INTRAMUSCULAR | Status: AC
  Filled 2014-04-18: qty 2

## 2014-04-18 MED ORDER — ONDANSETRON HCL 4 MG/2ML IJ SOLN: 4.0000 mg | Freq: Once | INTRAMUSCULAR | Status: DC | PRN

## 2014-04-18 MED ORDER — HYDROCODONE-ACETAMINOPHEN 5-325 MG PO TABS: 1.0000 | ORAL_TABLET | Freq: Four times a day (QID) | ORAL | Status: DC | PRN

## 2014-04-18 MED ORDER — FENTANYL CITRATE 0.05 MG/ML IJ SOLN
INTRAMUSCULAR | Status: DC | PRN
  Administered 2014-04-18 (×4): 25 ug via INTRAVENOUS

## 2014-04-18 MED ORDER — KETOROLAC TROMETHAMINE 10 MG PO TABS: 10.0000 mg | ORAL_TABLET | Freq: Three times a day (TID) | ORAL | Status: DC | PRN

## 2014-04-18 MED ORDER — ONDANSETRON HCL 8 MG PO TABS: 8.0000 mg | ORAL_TABLET | Freq: Three times a day (TID) | ORAL | Status: DC | PRN

## 2014-04-18 MED ORDER — CEFAZOLIN SODIUM-DEXTROSE 2-3 GM-% IV SOLR
2.0000 g | INTRAVENOUS | Status: AC
  Administered 2014-04-18: 2 g via INTRAVENOUS
  Filled 2014-04-18: qty 50

## 2014-04-18 MED ORDER — LACTATED RINGERS IV SOLN
INTRAVENOUS | Status: DC
  Administered 2014-04-18: 1000 mL via INTRAVENOUS
  Administered 2014-04-18: 500 mL via INTRAVENOUS

## 2014-04-18 MED ORDER — MIDAZOLAM HCL 5 MG/5ML IJ SOLN
INTRAMUSCULAR | Status: DC | PRN
  Administered 2014-04-18 (×2): 2 mg via INTRAVENOUS

## 2014-04-18 MED ORDER — ONDANSETRON HCL 4 MG/2ML IJ SOLN
4.0000 mg | Freq: Once | INTRAMUSCULAR | Status: AC
  Administered 2014-04-18: 4 mg via INTRAVENOUS

## 2014-04-18 MED ORDER — PROPOFOL 10 MG/ML IV BOLUS
INTRAVENOUS | Status: AC
  Filled 2014-04-18: qty 20

## 2014-04-18 MED ORDER — FENTANYL CITRATE 0.05 MG/ML IJ SOLN
25.0000 ug | INTRAMUSCULAR | Status: AC
  Administered 2014-04-18 (×2): 25 ug via INTRAVENOUS

## 2014-04-18 SURGICAL SUPPLY — 26 items
ABLATOR ENDOMETRIAL BIPOLAR (ABLATOR) ×3 IMPLANT
BAG HAMPER (MISCELLANEOUS) ×3 IMPLANT
CLOTH BEACON ORANGE TIMEOUT ST (SAFETY) ×3 IMPLANT
COVER LIGHT HANDLE STERIS (MISCELLANEOUS) ×6 IMPLANT
FORMALIN 10 PREFIL 120ML (MISCELLANEOUS) ×3 IMPLANT
GLOVE BIOGEL PI IND STRL 7.0 (GLOVE) IMPLANT
GLOVE BIOGEL PI IND STRL 7.5 (GLOVE) IMPLANT
GLOVE BIOGEL PI IND STRL 8 (GLOVE) ×1 IMPLANT
GLOVE BIOGEL PI INDICATOR 7.0 (GLOVE) ×2
GLOVE BIOGEL PI INDICATOR 7.5 (GLOVE) ×2
GLOVE BIOGEL PI INDICATOR 8 (GLOVE) ×2
GLOVE ECLIPSE 6.5 STRL STRAW (GLOVE) ×2 IMPLANT
GLOVE ECLIPSE 8.0 STRL XLNG CF (GLOVE) ×3 IMPLANT
GOWN STRL REUS W/TWL LRG LVL3 (GOWN DISPOSABLE) ×3 IMPLANT
GOWN STRL REUS W/TWL XL LVL3 (GOWN DISPOSABLE) ×3 IMPLANT
INST SET HYSTEROSCOPY (KITS) ×3 IMPLANT
IV NS 1000ML (IV SOLUTION) ×3
IV NS 1000ML BAXH (IV SOLUTION) ×1 IMPLANT
KIT ROOM TURNOVER AP CYSTO (KITS) ×3 IMPLANT
MANIFOLD NEPTUNE II (INSTRUMENTS) ×3 IMPLANT
NS IRRIG 1000ML POUR BTL (IV SOLUTION) ×3 IMPLANT
PACK PERI GYN (CUSTOM PROCEDURE TRAY) ×3 IMPLANT
PAD ARMBOARD 7.5X6 YLW CONV (MISCELLANEOUS) ×3 IMPLANT
PAD TELFA 3X4 1S STER (GAUZE/BANDAGES/DRESSINGS) ×3 IMPLANT
SET BASIN LINEN APH (SET/KITS/TRAYS/PACK) ×3 IMPLANT
SET IRRIG Y TYPE TUR BLADDER L (SET/KITS/TRAYS/PACK) ×3 IMPLANT

## 2014-04-18 NOTE — Anesthesia Preprocedure Evaluation (Signed)
Anesthesia Evaluation  Patient identified by MRN, date of birth, ID band Patient awake    Reviewed: Allergy & Precautions, NPO status , Patient's Chart, lab work & pertinent test results  Airway Mallampati: I  TM Distance: >3 FB     Dental  (+) Poor Dentition, Missing, Partial Lower   Pulmonary neg pulmonary ROS,  breath sounds clear to auscultation        Cardiovascular negative cardio ROS  Rhythm:Regular Rate:Normal     Neuro/Psych PSYCHIATRIC DISORDERS (decreased mentation)    GI/Hepatic GERD-  Medicated and Controlled,  Endo/Other    Renal/GU      Musculoskeletal   Abdominal   Peds  Hematology   Anesthesia Other Findings   Reproductive/Obstetrics                             Anesthesia Physical Anesthesia Plan  ASA: II  Anesthesia Plan: General   Post-op Pain Management:    Induction: Intravenous  Airway Management Planned: LMA  Additional Equipment:   Intra-op Plan:   Post-operative Plan: Extubation in OR  Informed Consent: I have reviewed the patients History and Physical, chart, labs and discussed the procedure including the risks, benefits and alternatives for the proposed anesthesia with the patient or authorized representative who has indicated his/her understanding and acceptance.     Plan Discussed with:   Anesthesia Plan Comments:         Anesthesia Quick Evaluation

## 2014-04-18 NOTE — Transfer of Care (Signed)
Immediate Anesthesia Transfer of Care Note  Patient: Audrey Collier  Procedure(s) Performed: Procedure(s) with comments: DILATATION & CURETTAGE/HYSTEROSCOPY WITH NOVASURE ENDOMETRIAL ABLATION (N/A) - Uterine Cavity Length 5cm   Uterine Cavity Width 4cm Power = 110 watts time = 55 seconds ENDOMETRIAL POLYPECTOMY (N/A)  Patient Location: PACU  Anesthesia Type:General  Level of Consciousness: awake and patient cooperative  Airway & Oxygen Therapy: Patient Spontanous Breathing and Patient connected to face mask oxygen  Post-op Assessment: Report given to RN, Post -op Vital signs reviewed and stable and Patient moving all extremities  Post vital signs: Reviewed and stable  Last Vitals:  Filed Vitals:   04/18/14 0830  BP:   Pulse:   Temp: 37 C  Resp:     Complications: No apparent anesthesia complications

## 2014-04-18 NOTE — Discharge Instructions (Signed)
Endometrial Ablation Endometrial ablation removes the lining of the uterus (endometrium). It is usually a same-day, outpatient treatment. Ablation helps avoid major surgery, such as surgery to remove the cervix and uterus (hysterectomy). After endometrial ablation, you will have little or no menstrual bleeding and may not be able to have children. However, if you are premenopausal, you will need to use a reliable method of birth control following the procedure because of the small chance that pregnancy can occur. There are different reasons to have this procedure, which include:  Heavy periods.  Bleeding that is causing anemia.  Irregular bleeding.  Bleeding fibroids on the lining inside the uterus if they are smaller than 3 centimeters. This procedure should not be done if:  You want children in the future.  You have severe cramps with your menstrual period.  You have precancerous or cancerous cells in your uterus.  You were recently pregnant.  You have gone through menopause.  You have had major surgery on the uterus, such as a cesarean delivery. LET YOUR HEALTH CARE PROVIDER KNOW ABOUT:  Any allergies you have.  All medicines you are taking, including vitamins, herbs, eye drops, creams, and over-the-counter medicines.  Previous problems you or members of your family have had with the use of anesthetics.  Any blood disorders you have.  Previous surgeries you have had.  Medical conditions you have. RISKS AND COMPLICATIONS  Generally, this is a safe procedure. However, as with any procedure, complications can occur. Possible complications include:  Perforation of the uterus.  Bleeding.  Infection of the uterus, bladder, or vagina.  Injury to surrounding organs.  An air bubble to the lung (air embolus).  Pregnancy following the procedure.  Failure of the procedure to help the problem, requiring hysterectomy.  Decreased ability to diagnose cancer in the lining of  the uterus. BEFORE THE PROCEDURE  The lining of the uterus must be tested to make sure there is no pre-cancerous or cancer cells present.  An ultrasound may be performed to look at the size of the uterus and to check for abnormalities.  Medicines may be given to thin the lining of the uterus. PROCEDURE  During the procedure, your health care provider will use a tool called a resectoscope to help see inside your uterus. There are different ways to remove the lining of your uterus.   Radiofrequency - This method uses a radiofrequency-alternating electric current to remove the lining of the uterus.  Cryotherapy - This method uses extreme cold to freeze the lining of the uterus.  Heated-Free Liquid - This method uses heated salt (saline) solution to remove the lining of the uterus.  Microwave - This method uses high-energy microwaves to heat up the lining of the uterus to remove it.  Thermal balloon - This method involves inserting a catheter with a balloon tip into the uterus. The balloon tip is filled with heated fluid to remove the lining of the uterus. AFTER THE PROCEDURE  After your procedure, do not have sexual intercourse or insert anything into your vagina until permitted by your health care provider. After the procedure, you may experience:  Cramps.  Vaginal discharge.  Frequent urination. Document Released: 12/27/2003 Document Revised: 10/19/2012 Document Reviewed: 07/20/2012 Exit[employer] Patient Information 2015 [employer]. This information is not intended to replace advice given to you by your health care provider. Make sure you discuss any questions you have with your health care provider.  

## 2014-04-18 NOTE — Telephone Encounter (Signed)
[name] pt caregiver called to update pt medication list. Medication list updated in chart.

## 2014-04-18 NOTE — Anesthesia Postprocedure Evaluation (Signed)
  Anesthesia Post-op Note  Patient: Audrey SevereSheila D Inman  Procedure(s) Performed: Procedure(s) with comments: DILATATION & CURETTAGE/HYSTEROSCOPY WITH NOVASURE ENDOMETRIAL ABLATION (N/A) - Uterine Cavity Length 5cm   Uterine Cavity Width 4cm Power = 110 watts time = 55 seconds ENDOMETRIAL POLYPECTOMY (N/A)  Patient Location: PACU  Anesthesia Type:General  Level of Consciousness: awake, alert , oriented and patient cooperative  Airway and Oxygen Therapy: Patient Spontanous Breathing  Post-op Pain: 2 /10, mild  Post-op Assessment: Post-op Vital signs reviewed, Patient's Cardiovascular Status Stable, Respiratory Function Stable, Patent Airway and No signs of Nausea or vomiting  Post-op Vital Signs: Reviewed and stable  Last Vitals:  Filed Vitals:   04/18/14 0830  BP: 103/64  Pulse: 76  Temp: 37 C  Resp: 14    Complications: No apparent anesthesia complications

## 2014-04-18 NOTE — H&P (Signed)
Expand All Collapse All   Patient ID: Audrey Collier, female DOB: 05/22/1960, 54 y.o. MRN: [id number] Preoperative History and Physical  Audrey Collier is a 54 y.o. G0P0000 with Patient's last menstrual period was 04/16/2013 (approximate). admitted for a evaluation and removal of endometrial polyp and treatment for perimenopausal bleeding.    PMH:  Past Medical History  Diagnosis Date  . Mental disorder     mentally challenged  . Fecal occult blood test positive 03/15/2013    will send 3 cards home and refer to GI for colonoscopy  . PMB (postmenopausal bleeding) 03/26/2014  . Thickened endometrium 03/26/2014    ?polyp will get HSG  . Endometrial polyp 03/26/2014    PSH:  Past Surgical History  Procedure Laterality Date  . No past surgeries    . Colonoscopy N/A 12/01/2013    Procedure: COLONOSCOPY; Surgeon: West Bali, MD; Location: [facility]; Service: Endoscopy; Laterality: N/A; 10:30 - moved to 11:00 - Ginger to notify pt  . Esophagogastroduodenoscopy N/A 12/01/2013    Procedure: ESOPHAGOGASTRODUODENOSCOPY (EGD); Surgeon: West Bali, MD; Location: [facility]; Service: Endoscopy; Laterality: N/A;    POb/GynH:  OB History    Gravida Para Term Preterm AB TAB SAB Ectopic Multiple Living        SH:  History  Substance Use Topics  . Smoking status: Never Smoker   . Smokeless tobacco: Never Used  . Alcohol Use: No    FH:  Family History  Problem Relation Age of Onset  . Diabetes Maternal Aunt   . Hypertension Maternal Aunt   . Diabetes Maternal Uncle   . Colon cancer Neg Hx      Allergies: No Known Allergies  Medications:  Current outpatient prescriptions:  . calcium-vitamin D 250-100 MG-UNIT per tablet, Take 1 tablet by mouth 2 (two) times daily. Unknown amount taken by patient., Disp: , Rfl:   . fexofenadine (ALLEGRA) 180 MG tablet, Take 180 mg by mouth daily., Disp: , Rfl:  . Linaclotide (LINZESS) 290 MCG CAPS capsule, Take 1 capsule (290 mcg total) by mouth daily., Disp: 30 capsule, Rfl: 11 . Multiple Vitamin (MULTIVITAMIN) tablet, Take 1 tablet by mouth daily., Disp: , Rfl:  . NON FORMULARY, IRON 65 MG TAKES ONE TABLET DAILY, Disp: , Rfl:  . omeprazole (PRILOSEC) 20 MG capsule, 1 po bid FOR 10 DAYS THEN ONE DAILY FOR 3 mos., Disp: 60 capsule, Rfl: 3 . amoxicillin (AMOXIL) 500 MG tablet, 2 PO BID FOR 10 DAYS (Patient not taking: Reported on 04/03/2014), Disp: 40 tablet, Rfl: 0 . clarithromycin (BIAXIN) 500 MG tablet, 1 PO BID FOR 10 DAYS. (Patient not taking: Reported on 04/03/2014), Disp: 20 tablet, Rfl: 0  Review of Systems:   Review of Systems  Constitutional: Negative for fever, chills, weight loss, malaise/fatigue and diaphoresis.  HENT: Negative for hearing loss, ear pain, nosebleeds, congestion, sore throat, neck pain, tinnitus and ear discharge.  Eyes: Negative for blurred vision, double vision, photophobia, pain, discharge and redness.  Respiratory: Negative for cough, hemoptysis, sputum production, shortness of breath, wheezing and stridor.  Cardiovascular: Negative for chest pain, palpitations, orthopnea, claudication, leg swelling and PND.  Gastrointestinal: Positive for abdominal pain. Negative for heartburn, nausea, vomiting, diarrhea, constipation, blood in stool and melena.  Genitourinary: Negative for dysuria, urgency, frequency, hematuria and flank pain.  Musculoskeletal: Negative for myalgias, back pain, joint pain and falls.  Skin: Negative for itching and rash.  Neurological: Negative for  dizziness, tingling, tremors, sensory change, speech change, focal weakness, seizures, loss of consciousness, weakness and headaches.  Endo/Heme/Allergies: Negative for environmental allergies and polydipsia. Does not bruise/bleed easily.   Psychiatric/Behavioral: Negative for depression, suicidal ideas, hallucinations, memory loss and substance abuse. The patient is not nervous/anxious and does not have insomnia.     PHYSICAL EXAM:  Blood pressure 130/90, weight 175 lb (79.379 kg), last menstrual period 04/16/2013.   Vitals reviewed. Constitutional: She is oriented to person, place, and time. She appears well-developed and well-nourished.  HENT:  Head: Normocephalic and atraumatic.  Right Ear: External ear normal.  Left Ear: External ear normal.  Nose: Nose normal.  Mouth/Throat: Oropharynx is clear and moist.  Eyes: Conjunctivae and EOM are normal. Pupils are equal, round, and reactive to light. Right eye exhibits no discharge. Left eye exhibits no discharge. No scleral icterus.  Neck: Normal range of motion. Neck supple. No tracheal deviation present. No thyromegaly present.  Cardiovascular: Normal rate, regular rhythm, normal heart sounds and intact distal pulses. Exam reveals no gallop and no friction rub.  No murmur heard. Respiratory: Effort normal and breath sounds normal. No respiratory distress. She has no wheezes. She has no rales. She exhibits no tenderness.  GI: Soft. Bowel sounds are normal. She exhibits no distension and no mass. There is tenderness. There is no rebound and no guarding.  Genitourinary:   Vulva is normal without lesions Vagina is pink moist without discharge Cervix normal in appearance and pap is normal Uterus is normal  Adnexa is negative with normal sized ovaries by sonogram  Musculoskeletal: Normal range of motion. She exhibits no edema and no tenderness.  Neurological: She is alert and oriented to person, place, and time. She has normal reflexes. She displays normal reflexes. No cranial nerve deficit. She exhibits normal muscle tone. Coordination normal.  Skin: Skin is warm and dry. No rash noted. No erythema. No pallor.  Psychiatric: She has a normal mood and affect. Her  behavior is normal. Judgment and thought content normal.    Labs: Results for orders placed or performed in visit on 03/26/14 (from the past 336 hour(s))  Follicle stimulating hormone   Collection Time: 03/26/14 3:18 PM  Result Value Ref Range   FSH 80.9 mIU/mL    EKG: No orders found for this or any previous visit.  Imaging Studies:  Imaging Results    [place] Pelvis Complete  04/03/2014 GYNECOLOGIC SONOGRAM Audrey Collier is a 54 y.o. No obstetric history on file. for a pelvic sonogram for post menopausal bleeding. Uterus 6.1 x 4.5 x 3.2 cm, anteverted Endometrium 11 mm, with focal area of thickening noted ?polyp= 8.605mm Right ovary 2.2 x 1.4 x 1.3 cm, Left ovary 2.0 x 1.5 x 1.6 cm, No free fluid or adnexal masses noted within the pelvis Technician Comments: Anteverted uterus noted with focal area of thickening noted within the endometrium (?polyp), bilateral adnexa/ovaries appear WNL, no free fluid or adnexal masses noted within the pelvis Chari ManningMcBride, Tasha 03/26/2014 2:57 PM Clinical Impression and recommendations: I have reviewed the sonogram results above, combined with the patient's current clinical course, below are my impressions and any appropriate recommendations for management based on the sonographic findings. Thickened endometrium with a separate mass, most likely a polyp Sonogram otherwise normal Aarit Kashuba H 04/03/2014 10:29 AM       Assessment: Patient Active Problem List   Diagnosis Date Noted  . PMB (postmenopausal bleeding) 03/26/2014  . Thickened endometrium 03/26/2014  . Endometrial polyp 03/26/2014  . Unspecified  constipation 04/11/2013  . Fecal occult blood test positive 03/15/2013    Plan: Hysteroscopy uerine curettage endometrial curettage

## 2014-04-18 NOTE — Anesthesia Procedure Notes (Signed)
Procedure Name: LMA Insertion Date/Time: 04/18/2014 7:43 AM Performed by: Despina HiddenIDACAVAGE, Karlen Barbar J Pre-anesthesia Checklist: Emergency Drugs available, Patient identified, Suction available and Patient being monitored Patient Re-evaluated:Patient Re-evaluated prior to inductionOxygen Delivery Method: Circle system utilized Preoxygenation: Pre-oxygenation with 100% oxygen Intubation Type: IV induction Ventilation: Mask ventilation without difficulty LMA: LMA inserted LMA Size: 4.0 Tube type: Oral Number of attempts: 1 Placement Confirmation: positive ETCO2 and breath sounds checked- equal and bilateral Tube secured with: Tape Dental Injury: Teeth and Oropharynx as per pre-operative assessment

## 2014-04-18 NOTE — Op Note (Signed)
Preoperative diagnosis: Post menopausal bleeding                                        Endometrial polyp   Postoperative diagnoses: Same as above   Procedure: Hysteroscopy, removal of endometrial polyp, uterine curettage, endometrial ablation using Novasure  Surgeon: Lazaro ArmsEURE,Ellery Tash H   Anesthesia: Laryngeal mask airway  Findings: The endometrium was significant for small endometrial polyp. There were multiple very small fibroids, no other abnormalities.  Description of operation: The patient was taken to the operating room and placed in the supine position. She underwent general anesthesia using the laryngeal mask airway. She was placed in the dorsal lithotomy position and prepped and draped in the usual sterile fashion. A Graves speculum was placed and the anterior cervical lip was grasped with a single-tooth tenaculum. The cervix was dilated serially to allow passage of the hysteroscope. Diagnostic hysteroscopy was performed and was found to be normal. A vigorous uterine curettage was then performed and all tissue sent to pathology for evaluation.  I then proceeded to perform the Novasure endometrial ablation.  The cervical length was 3.5. The uterus sounded to  8.5 cm yielding a net length of 5 cm.  The endometrial cavity was 4.0 cm wide. The power was 110 watts.  The total time of therapy was 0 min 55 seconds. The array was evaluated after the procedure and tissue was adherent on all the dimensions of the surface, confirming fundal treatment as well.    All of the equipment worked well throughout the procedure.  The patient was awakened from anesthesia and taken to the recovery room in good stable condition all counts were correct. She received 2 g of Ancef and 30 mg of Toradol preoperatively. She will be discharged from the recovery room and followed up in the office in 1- 2 weeks.  Serenity Batley H 04/18/2014 8:18 AM

## 2014-04-19 ENCOUNTER — Encounter (HOSPITAL_COMMUNITY): Payer: Self-pay | Admitting: Obstetrics & Gynecology

## 2014-05-01 ENCOUNTER — Ambulatory Visit (INDEPENDENT_AMBULATORY_CARE_PROVIDER_SITE_OTHER): Payer: Medicaid Other | Admitting: Obstetrics & Gynecology

## 2014-05-01 ENCOUNTER — Encounter: Payer: Self-pay | Admitting: Obstetrics & Gynecology

## 2014-05-01 VITALS — BP 120/80 | HR 80 | Wt 172.0 lb

## 2014-05-01 DIAGNOSIS — Z9889 Other specified postprocedural states: Secondary | ICD-10-CM | POA: Diagnosis not present

## 2014-05-01 NOTE — Progress Notes (Signed)
Patient ID: Audrey SevereSheila D Collier, female   DOB: 1960/08/09, 54 y.o.   MRN: [id number]  HPI: Patient returns for routine postoperative follow-up having undergone hysteroscopy uterine curettage endometrial ablation using NovaSure on 04/18/2014. The patient's early postoperative recovery while in the hospital was notable for unremarkable outpatient. Since hospital discharge the patient reports cramps no bleeding.   Current Outpatient Prescriptions  Medication Sig Dispense Refill  . calcium-vitamin D 250-100 MG-UNIT per tablet Take 1 tablet by mouth 2 (two) times daily. Unknown amount taken by patient.    . ferrous sulfate 325 (65 FE) MG tablet Take 325 mg by mouth daily with breakfast.    . fexofenadine (ALLEGRA) 180 MG tablet Take 180 mg by mouth daily.    . Linaclotide (LINZESS) 290 MCG CAPS capsule Take 1 capsule (290 mcg total) by mouth daily. 30 capsule 11  . meclizine (ANTIVERT) 25 MG tablet Take 25 mg by mouth every 4 (four) hours as needed for dizziness.    . Multiple Vitamin (MULTIVITAMIN) tablet Take 1 tablet by mouth daily.    [name]. omeprazole (PRILOSEC) 20 MG capsule Take 1 capsule (20 mg total) by mouth daily. 30 capsule 3  . HYDROcodone-acetaminophen (NORCO/VICODIN) 5-325 MG per tablet Take 1 tablet by mouth every 6 (six) hours as needed. (Patient not taking: Reported on 05/01/2014) 15 tablet 0  . ketorolac (TORADOL) 10 MG tablet Take 1 tablet (10 mg total) by mouth every 8 (eight) hours as needed. (Patient not taking: Reported on 05/01/2014) 15 tablet 0  . ondansetron (ZOFRAN) 8 MG tablet Take 1 tablet (8 mg total) by mouth every 8 (eight) hours as needed for nausea. (Patient not taking: Reported on 05/01/2014) 12 tablet 0   No current facility-administered medications for this visit.    Physical Exam: Abdomen soft benign NEFG Could not perform bimanual  Diagnostic Tests: Pathology: benign poly secretroy changes  Impression: S/P endometrial ablation  Plan: Normal post op course Follow  up 1 year

## 2014-07-17 ENCOUNTER — Other Ambulatory Visit: Payer: Self-pay | Admitting: Gastroenterology

## 2014-09-05 ENCOUNTER — Encounter: Payer: Self-pay | Admitting: Gastroenterology

## 2014-09-30 NOTE — Progress Notes (Signed)
REVIEWED-NO ADDITIONAL RECOMMENDATIONS. 

## 2014-10-16 NOTE — Progress Notes (Signed)
REVIEWED-NO ADDITIONAL RECOMMENDATIONS. 

## 2014-10-22 ENCOUNTER — Ambulatory Visit (INDEPENDENT_AMBULATORY_CARE_PROVIDER_SITE_OTHER): Payer: Medicaid Other | Admitting: Gastroenterology

## 2014-10-22 ENCOUNTER — Encounter: Payer: Self-pay | Admitting: Gastroenterology

## 2014-10-22 VITALS — BP 127/79 | HR 91 | Temp 97.4°F | Ht 65.0 in | Wt 174.8 lb

## 2014-10-22 DIAGNOSIS — B9681 Helicobacter pylori [H. pylori] as the cause of diseases classified elsewhere: Secondary | ICD-10-CM

## 2014-10-22 DIAGNOSIS — K2970 Gastritis, unspecified, without bleeding: Secondary | ICD-10-CM

## 2014-10-22 DIAGNOSIS — K5900 Constipation, unspecified: Secondary | ICD-10-CM

## 2014-10-22 NOTE — Progress Notes (Signed)
Referring Provider: Butch Penny, MD Primary Care Physician:  Alice Reichert, MD  Primary GI: Dr. Darrick Penna   Chief Complaint  Patient presents with  . Follow-up    abdominal pain    HPI:   Audrey Collier is a 54 y.o. female presenting today with a history of IDA, constipation. EGD/TCS on file from last year with +H.pylori gastritis. Completed treatment with amoxicillin an biaxin. Ferritin now normal at 23 (as of Jan 2016). Hgb normal.   BM each morning. States she has cramping lower abdominal pain. Intermittent, "back and forth". Sometimes occurs after BM. Better after BM. Linzess 290 mcg daily. Sometimes feels nauseated when taking pills in the morning. No fever/chills. Feels bloated at times. May not be productive with BMs.     Past Medical History  Diagnosis Date  . Mental disorder     mentally challenged  . Fecal occult blood test positive 03/15/2013    will send 3 cards home and refer to GI for colonoscopy  . PMB (postmenopausal bleeding) 03/26/2014  . Thickened endometrium 03/26/2014    ?polyp will get HSG  . Endometrial polyp 03/26/2014  . GERD (gastroesophageal reflux disease)   . Dizziness     Past Surgical History  Procedure Laterality Date  . No past surgeries    . Colonoscopy N/A 12/01/2013    Dr. Fields:moderate sized internal hemorrhoids/left colon is redundant  . Esophagogastroduodenoscopy N/A 12/01/2013    WJX:BJYN non erosive gastritis. +H.pylori   . Dilitation & currettage/hystroscopy with novasure ablation N/A 04/18/2014    Procedure: DILATATION & CURETTAGE/HYSTEROSCOPY WITH NOVASURE ENDOMETRIAL ABLATION;  Surgeon: Lazaro Arms, MD;  Location: [facility];  Service: Gynecology;  Laterality: N/A;  Uterine Cavity Length 5cm     . Polypectomy N/A 04/18/2014    Procedure: ENDOMETRIAL POLYPECTOMY;  Surgeon: Lazaro Arms, MD;  Location: [facility];  Service: Gynecology;  Laterality: N/A;    Current Outpatient Prescriptions  Medication Sig Dispense Refill  .  calcium-vitamin D 250-100 MG-UNIT per tablet Take 1 tablet by mouth 2 (two) times daily. Unknown amount taken by patient.    . ferrous sulfate 325 (65 FE) MG tablet Take 325 mg by mouth daily with breakfast.    . LINZESS 290 MCG CAPS capsule TAKE 1 CAPSULE BY MOUTH DAILY 30 MINUTES BEFORE BREAKFAST. 30 capsule 11  . loratadine (CLARITIN) 10 MG tablet Take 10 mg by mouth daily.    . Multiple Vitamin (MULTIVITAMIN) tablet Take 1 tablet by mouth daily.    [name] omeprazole (PRILOSEC) 20 MG capsule Take 1 capsule (20 mg total) by mouth daily. 30 capsule 3  . fexofenadine (ALLEGRA) 180 MG tablet Take 180 mg by mouth daily.    [name] HYDROcodone-acetaminophen (NORCO/VICODIN) 5-325 MG per tablet Take 1 tablet by mouth every 6 (six) hours as needed. (Patient not taking: Reported on 05/01/2014) 15 tablet 0  . ketorolac (TORADOL) 10 MG tablet Take 1 tablet (10 mg total) by mouth every 8 (eight) hours as needed. (Patient not taking: Reported on 05/01/2014) 15 tablet 0  . meclizine (ANTIVERT) 25 MG tablet Take 25 mg by mouth every 4 (four) hours as needed for dizziness.    . ondansetron (ZOFRAN) 8 MG tablet Take 1 tablet (8 mg total) by mouth every 8 (eight) hours as needed for nausea. (Patient not taking: Reported on 05/01/2014) 12 tablet 0   No current facility-administered medications for this visit.    Allergies as of 10/22/2014  . (No Known Allergies)  Family History  Problem Relation Age of Onset  . Diabetes Maternal Aunt   . Hypertension Maternal Aunt   . Diabetes Maternal Uncle   . Colon cancer Neg Hx     Social History   Social History  . Marital Status: Single    Spouse Name: N/A  . Number of Children: N/A  . Years of Education: N/A   Social History Main Topics  . Smoking status: Never Smoker   . Smokeless tobacco: Never Used     Comment: Never smoked  . Alcohol Use: No  . Drug Use: No  . Sexual Activity: No   Other Topics Concern  . None   Social History Narrative    Review of  Systems: Limited as mildly mentally challenged but otherwise negative as above.   Physical Exam: BP 127/79 mmHg  Pulse 91  Temp(Src) 97.4 F (36.3 C) (Oral)  Ht  (1.651 m)  Wt 174 lb 12.8 oz (79.289 kg)  BMI 29.09 kg/m2  LMP 04/16/2013 (Approximate) General:   Alert and oriented. No distress noted. Pleasant and cooperative.  Head:  Normocephalic and atraumatic. Eyes:  Conjuctiva clear without scleral icterus. Abdomen:  +BS, soft, non-tender and non-distended. No rebound or guarding. No HSM or masses noted. Query very small umbilical hernia.  Msk:  Symmetrical without gross deformities. Normal posture. Extremities:  Without edema. Neurologic:  Alert and  oriented x4;  grossly normal neurologically. Psych:  Alert and cooperative. Normal mood and affect.

## 2014-10-22 NOTE — Patient Instructions (Signed)
Continue to take Linzess each morning, 30 minutes before breakfast.   I would like you to start taking supplemental fiber every day. Some options over the counter are fiber gummies, Metamucil, and Benefiber.   I would also like to try a probiotic every day such as Align, Restora, Philip's Colon Health, Digestive Advantage, or [employer] brand.   Try to avoid drinking diet sodas or any carbonation.   We will see you in 2-4 weeks!

## 2014-10-24 DIAGNOSIS — K2970 Gastritis, unspecified, without bleeding: Secondary | ICD-10-CM

## 2014-10-24 DIAGNOSIS — B9681 Helicobacter pylori [H. pylori] as the cause of diseases classified elsewhere: Secondary | ICD-10-CM | POA: Insufficient documentation

## 2014-10-24 NOTE — Assessment & Plan Note (Signed)
With intermittent lower abdominal cramping associated with BMs, consistent with IBS. Colonoscopy on file. Question non-productive BMs. Will trial adding fiber supplementation and a probiotic. Consider CT scan if persistent pain despite supportive measures. No alarm features. Return in 2-4 weeks.

## 2014-10-24 NOTE — Progress Notes (Signed)
CC'ED TO PCP 

## 2014-10-24 NOTE — Assessment & Plan Note (Signed)
On EGD. Treated with amoxicillin and biaxin. Will check H.pylori stool antigen off PPI in next few weeks.

## 2014-11-06 ENCOUNTER — Other Ambulatory Visit: Payer: Self-pay | Admitting: Nurse Practitioner

## 2014-11-15 ENCOUNTER — Encounter: Payer: Self-pay | Admitting: Gastroenterology

## 2014-11-15 ENCOUNTER — Ambulatory Visit: Payer: Medicaid Other | Admitting: Gastroenterology

## 2014-11-15 ENCOUNTER — Telehealth: Payer: Self-pay | Admitting: Gastroenterology

## 2014-11-15 NOTE — Telephone Encounter (Signed)
Mailed letter °

## 2014-11-15 NOTE — Telephone Encounter (Signed)
Pt was a no show

## 2014-11-30 ENCOUNTER — Ambulatory Visit (INDEPENDENT_AMBULATORY_CARE_PROVIDER_SITE_OTHER): Payer: Medicaid Other | Admitting: Gastroenterology

## 2014-11-30 ENCOUNTER — Encounter: Payer: Self-pay | Admitting: Gastroenterology

## 2014-11-30 VITALS — BP 123/80 | HR 78 | Temp 97.3°F | Ht 64.0 in | Wt 170.8 lb

## 2014-11-30 DIAGNOSIS — B9681 Helicobacter pylori [H. pylori] as the cause of diseases classified elsewhere: Secondary | ICD-10-CM | POA: Diagnosis not present

## 2014-11-30 DIAGNOSIS — K2970 Gastritis, unspecified, without bleeding: Secondary | ICD-10-CM

## 2014-11-30 DIAGNOSIS — R109 Unspecified abdominal pain: Secondary | ICD-10-CM | POA: Diagnosis not present

## 2014-11-30 MED ORDER — LINACLOTIDE 145 MCG PO CAPS
145.0000 ug | ORAL_CAPSULE | Freq: Every day | ORAL | Status: DC
Start: 2014-11-30 — End: 2015-04-19

## 2014-11-30 NOTE — Progress Notes (Signed)
CC'D TO PCP °

## 2014-11-30 NOTE — Assessment & Plan Note (Signed)
In setting of IBS. Appears Linzess may be too strong, decrease to 145 mcg. Poor historian. Noted weight loss of 4 lbs since last month. Proceed with CT due to abdominal pain. Will likely be benign but need to exclude occult etiology. Colonoscopy up-to-date. Continue fiber daily. Return in 6-8 weeks.

## 2014-11-30 NOTE — Assessment & Plan Note (Signed)
Per biopsy on EGD, s/p treatment with amoxicillin and biaxin. Stop PPI for 2 weeks, check urea breath test, then resume PPI.

## 2014-11-30 NOTE — Progress Notes (Signed)
Referring Provider: Butch Penny, MD Primary Care Physician:  Alice Reichert, MD  Primary GI: Dr. Darrick Penna   Chief Complaint  Patient presents with  . Abdominal Pain    HPI:   Audrey Collier is a 54 y.o. female presenting today with a history of IDA, constipation. EGD/TCS on file from last year with +H.pylori gastritis. Completed treatment with amoxicillin an biaxin. Ferritin normal at 23 (as of Jan 2016). Hgb normal.  At last visit several weeks ago was noting vague abdominal discomfort associated with BMs. Asked to continue Linzess each morning, add supplemental fiber, add probiotic, avoid carbonation. Needs H.pylori stool antigen. Consider CT if persistent abdominal pain.   Drinking plain water. Staying off of soda. Intermittent abdominal discomfort. Vague. Down 4 lbs. Pain improved after BM. No pain after eating. Caretaker states she has loose stools several times a day. Poor historian.    Past Medical History  Diagnosis Date  . Mental disorder     mentally challenged  . Fecal occult blood test positive 03/15/2013    will send 3 cards home and refer to GI for colonoscopy  . PMB (postmenopausal bleeding) 03/26/2014  . Thickened endometrium 03/26/2014    ?polyp will get HSG  . Endometrial polyp 03/26/2014  . GERD (gastroesophageal reflux disease)   . Dizziness     Past Surgical History  Procedure Laterality Date  . No past surgeries    . Colonoscopy N/A 12/01/2013    Dr. Fields:moderate sized internal hemorrhoids/left colon is redundant  . Esophagogastroduodenoscopy N/A 12/01/2013    ZDG:UYQI non erosive gastritis. +H.pylori   . Dilitation & currettage/hystroscopy with novasure ablation N/A 04/18/2014    Procedure: DILATATION & CURETTAGE/HYSTEROSCOPY WITH NOVASURE ENDOMETRIAL ABLATION;  Surgeon: Lazaro Arms, MD;  Location: [facility];  Service: Gynecology;  Laterality: N/A;  Uterine Cavity Length 5cm     . Polypectomy N/A 04/18/2014    Procedure: ENDOMETRIAL POLYPECTOMY;   Surgeon: Lazaro Arms, MD;  Location: [facility];  Service: Gynecology;  Laterality: N/A;    Current Outpatient Prescriptions  Medication Sig Dispense Refill  . calcium-vitamin D 250-100 MG-UNIT per tablet Take 1 tablet by mouth 2 (two) times daily. Unknown amount taken by patient.    . ferrous sulfate 325 (65 FE) MG tablet Take 325 mg by mouth daily with breakfast.    . LINZESS 290 MCG CAPS capsule TAKE 1 CAPSULE BY MOUTH DAILY 30 MINUTES BEFORE BREAKFAST. 30 capsule 11  . loratadine (CLARITIN) 10 MG tablet Take 10 mg by mouth daily.    . Multiple Vitamin (MULTIVITAMIN) tablet Take 1 tablet by mouth daily.    [name] omeprazole (PRILOSEC) 20 MG capsule TAKE 1 CAPSULE BY MOUTH ONCE DAILY. 30 capsule 5   No current facility-administered medications for this visit.    Allergies as of 11/30/2014  . (No Known Allergies)    Family History  Problem Relation Age of Onset  . Diabetes Maternal Aunt   . Hypertension Maternal Aunt   . Diabetes Maternal Uncle   . Colon cancer Neg Hx     Social History   Social History  . Marital Status: Single    Spouse Name: N/A  . Number of Children: N/A  . Years of Education: N/A   Social History Main Topics  . Smoking status: Never Smoker   . Smokeless tobacco: Never Used     Comment: Never smoked  . Alcohol Use: No  . Drug Use: No  . Sexual Activity: No   Other  Topics Concern  . None   Social History Narrative    Review of Systems: Negative unless mentioned in HPI   Physical Exam: BP 123/80 mmHg  Pulse 78  Temp(Src) 97.3 F (36.3 C)  Ht  (1.626 m)  Wt 170 lb 12.8 oz (77.474 kg)  BMI 29.30 kg/m2  LMP 04/16/2013 (Approximate) General:   Alert and oriented. No distress noted. Pleasant and cooperative.  Head:  Normocephalic and atraumatic. Eyes:  Conjuctiva clear without scleral icterus. Abdomen:  +BS, soft, non-tender and non-distended. No rebound or guarding. No HSM or masses noted. Msk:  Symmetrical without gross deformities.  Normal posture. Extremities:  Without edema. Neurologic:  Alert and  oriented x4;  grossly normal neurologically. Psych:  Alert and cooperative. Normal mood and affect.

## 2014-11-30 NOTE — Patient Instructions (Signed)
I have decreased Linzess to 145 mcg daily. I sent this to the pharmacy.   Continue fiber supplements daily.  Please stop Prilosec for 14 days, do the breath test at the lab, then you can restart it.   I have ordered a CT scan.   We will see you in 6-8 weeks.

## 2014-12-05 ENCOUNTER — Encounter (HOSPITAL_COMMUNITY): Payer: Self-pay

## 2014-12-05 ENCOUNTER — Ambulatory Visit (HOSPITAL_COMMUNITY)
Admission: RE | Admit: 2014-12-05 | Discharge: 2014-12-05 | Disposition: A | Discharge status: Home / Self Care | Payer: Medicaid Other | Source: Ambulatory Visit | Attending: Gastroenterology | Admitting: Gastroenterology

## 2014-12-05 DIAGNOSIS — R1030 Lower abdominal pain, unspecified: Secondary | ICD-10-CM | POA: Insufficient documentation

## 2014-12-05 DIAGNOSIS — R109 Unspecified abdominal pain: Secondary | ICD-10-CM

## 2014-12-05 DIAGNOSIS — K449 Diaphragmatic hernia without obstruction or gangrene: Secondary | ICD-10-CM | POA: Insufficient documentation

## 2014-12-05 MED ORDER — IOHEXOL 300 MG/ML  SOLN
100.0000 mL | Freq: Once | INTRAMUSCULAR | Status: AC | PRN
  Administered 2014-12-05: 100 mL via INTRAVENOUS

## 2014-12-06 NOTE — Progress Notes (Signed)
REVIEWED. CT SHOWS PT IS FOS. CONTINUE LINZESS. REMIND PT TO DRINK WATER AND AD FIBER.

## 2014-12-06 NOTE — Progress Notes (Signed)
REVIEWED-NO ADDITIONAL RECOMMENDATIONS. 

## 2014-12-16 NOTE — Progress Notes (Signed)
Quick Note:  CT without acute or occult findings.  Continue with current plan and keep appt in Nov. ______

## 2014-12-18 NOTE — Progress Notes (Signed)
Quick Note:  Called to [employer]inform Audrey Collier. She had her niece, Audrey Collier, take the message since she was in bed.  I gave the result and told to keep appt. ______

## 2014-12-28 LAB — H. PYLORI BREATH TEST: H. PYLORI BREATH TEST: DETECTED — AB

## 2015-01-02 NOTE — Progress Notes (Signed)
Please see Dr. Darrick PennaFields' recommendations. Continue fiber, linzess, drink plenty of water.

## 2015-01-02 NOTE — Progress Notes (Signed)
I called and informed Bjorn LoserRhonda.

## 2015-01-13 ENCOUNTER — Other Ambulatory Visit: Payer: Self-pay | Admitting: Gastroenterology

## 2015-01-13 MED ORDER — BIS SUBCIT-METRONID-TETRACYC 140-125-125 MG PO CAPS: 3.0000 | ORAL_CAPSULE | Freq: Three times a day (TID) | ORAL | Status: DC

## 2015-01-13 NOTE — Progress Notes (Signed)
Quick Note:  H.pylori still present. Treated with prevpac last time. Needs pylera X 10 days. I sent to pharmacy. Needs to increase Prilosec to BID while on Pylera, then back to once daily. ______

## 2015-01-14 NOTE — Progress Notes (Signed)
Quick Note:  I called and informed [name]. ______

## 2015-01-15 ENCOUNTER — Other Ambulatory Visit: Payer: Self-pay | Admitting: Adult Health

## 2015-01-15 DIAGNOSIS — N6489 Other specified disorders of breast: Secondary | ICD-10-CM

## 2015-01-16 ENCOUNTER — Other Ambulatory Visit: Payer: Self-pay | Admitting: Adult Health

## 2015-01-16 DIAGNOSIS — Z09 Encounter for follow-up examination after completed treatment for conditions other than malignant neoplasm: Secondary | ICD-10-CM

## 2015-01-16 DIAGNOSIS — N6489 Other specified disorders of breast: Secondary | ICD-10-CM

## 2015-01-17 ENCOUNTER — Ambulatory Visit (INDEPENDENT_AMBULATORY_CARE_PROVIDER_SITE_OTHER): Payer: Medicaid Other | Admitting: Gastroenterology

## 2015-01-17 ENCOUNTER — Encounter: Payer: Self-pay | Admitting: Gastroenterology

## 2015-01-17 VITALS — BP 124/79 | HR 77 | Temp 97.6°F | Ht 63.0 in | Wt 172.0 lb

## 2015-01-17 DIAGNOSIS — K2970 Gastritis, unspecified, without bleeding: Secondary | ICD-10-CM

## 2015-01-17 DIAGNOSIS — K5900 Constipation, unspecified: Secondary | ICD-10-CM | POA: Diagnosis not present

## 2015-01-17 DIAGNOSIS — B9681 Helicobacter pylori [H. pylori] as the cause of diseases classified elsewhere: Secondary | ICD-10-CM

## 2015-01-17 NOTE — Assessment & Plan Note (Signed)
Treated with amoxicillin and biaxin originally, with recent breath test still positive for H.pylori. Recently started Pylera but not taking PPI BID. I have asked her to increase this to twice a day while on Pylera, then resume again once a day. Return in 3 months. Will pursue a breath test in the future, 3-6 months from now.

## 2015-01-17 NOTE — Progress Notes (Signed)
Referring Provider: Butch PennyMcInnis, Angus, MD Primary Care Physician:  Alice ReichertMCINNIS,ANGUS G, MD  Primary GI: Dr. Darrick PennaFields   Chief Complaint  Patient presents with  . Follow-up    HPI:   Audrey Collier is a 54 y.o. female presenting today with a history of IDA, constipation. EGD/TCS on file from last year with +H.pylori gastritis. Completed treatment with amoxicillin an biaxin. Recent breath test still positive for H.pylori. Prescribed Pylera this week. Recent CT for abdominal pain noted stool throughout colon. Likely culprit of abdominal pain. Here for close follow-up.   No abdominal pain. BM loose, every time she eats. Linzess 290 too strong. On Linzess 145. Hasn't tried Amitiza. Weight stable/improved from last visit. Has a cold. When she has a cold, doesn't feel like eating. Very talkative. No GI complaints otherwise. Taking Pylera but not taking Prilosec BID with Pylera.    Past Medical History  Diagnosis Date  . Mental disorder     mentally challenged  . Fecal occult blood test positive 03/15/2013    will send 3 cards home and refer to GI for colonoscopy  . PMB (postmenopausal bleeding) 03/26/2014  . Thickened endometrium 03/26/2014    ?polyp will get HSG  . Endometrial polyp 03/26/2014  . GERD (gastroesophageal reflux disease)   . Dizziness     Past Surgical History  Procedure Laterality Date  . No past surgeries    . Colonoscopy N/A 12/01/2013    Dr. Fields:moderate sized internal hemorrhoids/left colon is redundant  . Esophagogastroduodenoscopy N/A 12/01/2013    ZHY:QMVHSLF:mild non erosive gastritis. +H.pylori   . Dilitation & currettage/hystroscopy with novasure ablation N/A 04/18/2014    Procedure: DILATATION & CURETTAGE/HYSTEROSCOPY WITH NOVASURE ENDOMETRIAL ABLATION;  Surgeon: Lazaro ArmsLuther H Eure, MD;  Location: [facility];  Service: Gynecology;  Laterality: N/A;  Uterine Cavity Length 5cm     . Polypectomy N/A 04/18/2014    Procedure: ENDOMETRIAL POLYPECTOMY;  Surgeon: Lazaro ArmsLuther H Eure, MD;   Location: [facility];  Service: Gynecology;  Laterality: N/A;    Current Outpatient Prescriptions  Medication Sig Dispense Refill  . calcium-vitamin D 250-100 MG-UNIT per tablet Take 1 tablet by mouth 2 (two) times daily. Unknown amount taken by patient.    . ferrous sulfate 325 (65 FE) MG tablet Take 325 mg by mouth daily with breakfast.    . Linaclotide (LINZESS) 145 MCG CAPS capsule Take 1 capsule (145 mcg total) by mouth daily. 30 minutes before breakfast 30 capsule 5  . loratadine (CLARITIN) 10 MG tablet Take 10 mg by mouth daily.    . Multiple Vitamin (MULTIVITAMIN) tablet Take 1 tablet by mouth daily.    [name]. omeprazole (PRILOSEC) 20 MG capsule TAKE 1 CAPSULE BY MOUTH ONCE DAILY. 30 capsule 5  . bismuth-metronidazole-tetracycline (PYLERA) 140-125-125 MG capsule Take 3 capsules by mouth 4 (four) times daily -  before meals and at bedtime. (Patient not taking: Reported on 01/17/2015) 120 capsule 0   No current facility-administered medications for this visit.    Allergies as of 01/17/2015  . (No Known Allergies)    Family History  Problem Relation Age of Onset  . Diabetes Maternal Aunt   . Hypertension Maternal Aunt   . Diabetes Maternal Uncle   . Colon cancer Neg Hx     Social History   Social History  . Marital Status: Single    Spouse Name: N/A  . Number of Children: N/A  . Years of Education: N/A   Social History Main Topics  . Smoking status: Never  Smoker   . Smokeless tobacco: Never Used     Comment: Never smoked  . Alcohol Use: No  . Drug Use: No  . Sexual Activity: No   Other Topics Concern  . None   Social History Narrative    Review of Systems: As mentioned in HPI   Physical Exam: BP 124/79 mmHg  Pulse 77  Temp(Src) 97.6 F (36.4 C) (Oral)  Ht  (1.6 m)  Wt 172 lb (78.019 kg)  BMI 30.48 kg/m2  LMP 04/16/2013 (Approximate) General:   Alert and oriented. No distress noted. Pleasant and cooperative.  Head:  Normocephalic and atraumatic. Eyes:   Conjuctiva clear without scleral icterus. Abdomen:  +BS, soft, non-tender and non-distended. No rebound or guarding. Small umbilical hernia  Msk:  Symmetrical without gross deformities. Normal posture. Extremities:  Without edema. Neurologic:  Alert and  oriented x4;  grossly normal neurologically. Psych:  Alert and cooperative. Normal mood and affect.

## 2015-01-17 NOTE — Assessment & Plan Note (Signed)
Linzess 145 mcg daily with persistent looser stools after each meal. Will trial Amitiza 24 mcg po BID. CT on file with large stool burden. Abdominal pain improved, doing well otherwise. Return in 3 months.

## 2015-01-17 NOTE — Patient Instructions (Signed)
For constipation: let's stop Linzess and have you try Amitiza 1 gelcap twice a day WITH FOOD to avoid nausea. I have provided samples. If this works for you, I will send in a prescription.   While you are on the antibiotics, increase the Prilosec to twice a day. You may go back to once a day once the antibiotics are done.   We will see you in 3 months!  Happy Thanksgiving and Christmas!

## 2015-01-21 NOTE — Progress Notes (Signed)
CC'D TO PCP °

## 2015-01-29 ENCOUNTER — Other Ambulatory Visit: Payer: Self-pay

## 2015-01-29 ENCOUNTER — Ambulatory Visit (HOSPITAL_COMMUNITY)
Admission: RE | Admit: 2015-01-29 | Discharge: 2015-01-29 | Disposition: A | Discharge status: Home / Self Care | Payer: Medicaid Other | Source: Ambulatory Visit | Attending: Adult Health | Admitting: Adult Health

## 2015-01-29 DIAGNOSIS — N6489 Other specified disorders of breast: Secondary | ICD-10-CM | POA: Diagnosis not present

## 2015-01-29 DIAGNOSIS — Z09 Encounter for follow-up examination after completed treatment for conditions other than malignant neoplasm: Secondary | ICD-10-CM

## 2015-01-29 MED ORDER — LUBIPROSTONE 24 MCG PO CAPS: 24.0000 ug | ORAL_CAPSULE | Freq: Two times a day (BID) | ORAL | Status: DC

## 2015-03-13 ENCOUNTER — Ambulatory Visit (HOSPITAL_COMMUNITY)
Admission: RE | Admit: 2015-03-13 | Discharge: 2015-03-13 | Disposition: A | Discharge status: Home / Self Care | Payer: Medicaid Other | Source: Ambulatory Visit | Attending: Internal Medicine | Admitting: Internal Medicine

## 2015-03-13 ENCOUNTER — Other Ambulatory Visit (HOSPITAL_COMMUNITY): Payer: Self-pay | Admitting: Internal Medicine

## 2015-03-13 DIAGNOSIS — M25562 Pain in left knee: Secondary | ICD-10-CM | POA: Diagnosis present

## 2015-04-18 ENCOUNTER — Other Ambulatory Visit (HOSPITAL_COMMUNITY): Payer: Self-pay | Admitting: Internal Medicine

## 2015-04-19 ENCOUNTER — Ambulatory Visit (INDEPENDENT_AMBULATORY_CARE_PROVIDER_SITE_OTHER): Payer: Medicaid Other | Admitting: Gastroenterology

## 2015-04-19 ENCOUNTER — Encounter: Payer: Self-pay | Admitting: Gastroenterology

## 2015-04-19 VITALS — BP 128/77 | HR 91 | Temp 97.1°F | Ht 63.0 in | Wt 166.8 lb

## 2015-04-19 DIAGNOSIS — B9681 Helicobacter pylori [H. pylori] as the cause of diseases classified elsewhere: Secondary | ICD-10-CM | POA: Diagnosis not present

## 2015-04-19 DIAGNOSIS — K2970 Gastritis, unspecified, without bleeding: Secondary | ICD-10-CM

## 2015-04-19 DIAGNOSIS — K5900 Constipation, unspecified: Secondary | ICD-10-CM

## 2015-04-19 NOTE — Assessment & Plan Note (Signed)
Doing well with Amitiza 24 mcg po BID. Continue. About 5 lbs of weight loss since last visit noted. No alarm features. Return in 6 weeks for close follow-up.

## 2015-04-19 NOTE — Assessment & Plan Note (Signed)
Treatment with amoxicillin and biaxin originally, with positive breath test thereafter. Treated with Pylera subsequently. Will have her come off PPI X 2 weeks, recheck breath test, then resume Prilosec. Check CBC, iron, ferritin now due to history of IDA. 6 week return.

## 2015-04-19 NOTE — Patient Instructions (Signed)
I would like for you to stop Prilosec for 14 days. After 14 days is up, go to the lab to have the breath test done. You can have your blood work done then, too (CBC, iron, ferritin).  After you do the breath test, start taking Prilosec once daily.  I will see you in 6 weeks!  I am so glad you are doing better.

## 2015-04-19 NOTE — Progress Notes (Signed)
Referring Provider: Butch Penny, MD Primary Care Physician:  Alice Reichert, MD  Primary GI: Dr. Darrick Penna   Chief Complaint  Patient presents with  . Follow-up  . Abdominal Pain    HPI:   Audrey Collier is a 55 y.o. female presenting today with a history of IDA, constipation. EGD/TCS on file from Oct 2015 with +H.pylori gastritis. Completed treatment with amoxicillin and biaxin. Breath test was still positive for H.pylori after treatment.  Prescribed Pylera Nov 2016. CT last year with stool throughout colon Likely culprit of abdominal pain. Here for close follow-up. Needs urea breath test. Prescribed Amitiza at last visit as Linzess was too strong. Down 5-6 lbs from Nov 2016. Appears to stay in the 170 to mid 170s range.   Vomited this morning but feels better. Amitiza makes her have a BM twice a day, no diarrhea. Doing better. Abdominal pain better. Abdominal pain better with new medication. Loves to eat but thinks the appetite is "gone a little bit". "doesn't suit me to eat". No dysphagia.   Past Medical History  Diagnosis Date  . Mental disorder     mentally challenged  . Fecal occult blood test positive 03/15/2013    will send 3 cards home and refer to GI for colonoscopy  . PMB (postmenopausal bleeding) 03/26/2014  . Thickened endometrium 03/26/2014    ?polyp will get HSG  . Endometrial polyp 03/26/2014  . GERD (gastroesophageal reflux disease)   . Dizziness     Past Surgical History  Procedure Laterality Date  . No past surgeries    . Colonoscopy N/A 12/01/2013    Dr. Fields:moderate sized internal hemorrhoids/left colon is redundant  . Esophagogastroduodenoscopy N/A 12/01/2013    WUJ:WJXB non erosive gastritis. +H.pylori   . Dilitation & currettage/hystroscopy with novasure ablation N/A 04/18/2014    Procedure: DILATATION & CURETTAGE/HYSTEROSCOPY WITH NOVASURE ENDOMETRIAL ABLATION;  Surgeon: Lazaro Arms, MD;  Location: [facility];  Service: Gynecology;  Laterality: N/A;   Uterine Cavity Length 5cm     . Polypectomy N/A 04/18/2014    Procedure: ENDOMETRIAL POLYPECTOMY;  Surgeon: Lazaro Arms, MD;  Location: [facility];  Service: Gynecology;  Laterality: N/A;    Current Outpatient Prescriptions  Medication Sig Dispense Refill  . calcium-vitamin D 250-100 MG-UNIT per tablet Take 1 tablet by mouth 2 (two) times daily. Unknown amount taken by patient.    . ferrous sulfate 325 (65 FE) MG tablet Take 325 mg by mouth daily with breakfast.    . loratadine (CLARITIN) 10 MG tablet Take 10 mg by mouth daily.    [name] lubiprostone (AMITIZA) 24 MCG capsule Take 1 capsule (24 mcg total) by mouth 2 (two) times daily with a meal. 60 capsule 3  . Multiple Vitamin (MULTIVITAMIN) tablet Take 1 tablet by mouth daily.    [name] omeprazole (PRILOSEC) 20 MG capsule TAKE 1 CAPSULE BY MOUTH ONCE DAILY. 30 capsule 5   No current facility-administered medications for this visit.    Allergies as of 04/19/2015  . (No Known Allergies)    Family History  Problem Relation Age of Onset  . Diabetes Maternal Aunt   . Hypertension Maternal Aunt   . Diabetes Maternal Uncle   . Colon cancer Neg Hx     Social History   Social History  . Marital Status: Single    Spouse Name: N/A  . Number of Children: N/A  . Years of Education: N/A   Social History Main Topics  . Smoking status: Never Smoker   .  Smokeless tobacco: Never Used     Comment: Never smoked  . Alcohol Use: No  . Drug Use: No  . Sexual Activity: No   Other Topics Concern  . None   Social History Narrative    Review of Systems: As mentioned in HPI.   Physical Exam: BP 128/77 mmHg  Pulse 91  Temp(Src) 97.1 F (36.2 C)  Ht  (1.6 m)  Wt 166 lb 12.8 oz (75.66 kg)  BMI 29.55 kg/m2  LMP 04/16/2013 (Approximate) General:   Alert and oriented. No distress noted. Pleasant and cooperative.  Head:  Normocephalic and atraumatic. Eyes:  Conjuctiva clear without scleral icterus. Abdomen:  +BS, soft, non-tender and  non-distended. No rebound or guarding. No HSM or masses noted. Msk:  Symmetrical without gross deformities. Normal posture. Extremities:  Without edema. Neurologic:  Alert and  oriented x4;  grossly normal neurologically. Psych:  Alert and cooperative. Normal mood and affect.

## 2015-04-22 NOTE — Progress Notes (Signed)
cc'ed to pcp °

## 2015-04-25 ENCOUNTER — Other Ambulatory Visit (HOSPITAL_COMMUNITY): Payer: Self-pay | Admitting: Internal Medicine

## 2015-04-25 DIAGNOSIS — Z780 Asymptomatic menopausal state: Secondary | ICD-10-CM

## 2015-04-25 DIAGNOSIS — M810 Age-related osteoporosis without current pathological fracture: Secondary | ICD-10-CM

## 2015-04-29 ENCOUNTER — Ambulatory Visit (HOSPITAL_COMMUNITY)
Admission: RE | Admit: 2015-04-29 | Discharge: 2015-04-29 | Disposition: A | Discharge status: Home / Self Care | Payer: Medicaid Other | Source: Ambulatory Visit | Attending: Internal Medicine | Admitting: Internal Medicine

## 2015-04-29 DIAGNOSIS — Z780 Asymptomatic menopausal state: Secondary | ICD-10-CM | POA: Insufficient documentation

## 2015-04-29 DIAGNOSIS — M810 Age-related osteoporosis without current pathological fracture: Secondary | ICD-10-CM | POA: Diagnosis not present

## 2015-05-17 ENCOUNTER — Other Ambulatory Visit: Payer: Self-pay | Admitting: Nurse Practitioner

## 2015-05-31 ENCOUNTER — Encounter: Payer: Self-pay | Admitting: Gastroenterology

## 2015-05-31 ENCOUNTER — Ambulatory Visit (INDEPENDENT_AMBULATORY_CARE_PROVIDER_SITE_OTHER): Payer: Medicaid Other | Admitting: Gastroenterology

## 2015-05-31 VITALS — BP 130/81 | HR 83 | Temp 97.0°F | Ht 62.0 in | Wt 164.8 lb

## 2015-05-31 DIAGNOSIS — K5900 Constipation, unspecified: Secondary | ICD-10-CM

## 2015-05-31 DIAGNOSIS — K2970 Gastritis, unspecified, without bleeding: Principal | ICD-10-CM

## 2015-05-31 DIAGNOSIS — B9681 Helicobacter pylori [H. pylori] as the cause of diseases classified elsewhere: Secondary | ICD-10-CM

## 2015-05-31 NOTE — Assessment & Plan Note (Signed)
Continue Amitiz 24 mcg po BID.

## 2015-05-31 NOTE — Progress Notes (Signed)
Referring Provider: Butch PennyMcInnis, Angus, MD Primary Care Physician:  Pearson GrippeJames Kim, MD  Primary GI: Dr. Darrick PennaFields   Chief Complaint  Patient presents with  . Follow-up    HPI:   Audrey Collier is a 55 y.o. female presenting today with a history of IDA, constipation. EGD/TCS on file from Oct 2015 with +H.pylori gastritis. Completed treatment with amoxicillin and biaxin. Breath test was still positive for H.pylori after treatment. Prescribed Pylera Nov 2016. CT last year with stool throughout colon Likely culprit of abdominal pain. Here for close follow-up. Needs urea breath test. She is down 2 lbs from February. Appears her baseline is in the 170 to mid 170s range. Currently 164. Has done well with Amitiza 24 mcg po BID. Needs recheck of CBC, iron, ferritin due to history of IDA.    QUIT DRINKING DR PEPPER. Only water now. Caretaker thinks this is the reason for slow weight loss. No abdominal pain.   Past Medical History  Diagnosis Date  . Mental disorder     mentally challenged  . Fecal occult blood test positive 03/15/2013    will send 3 cards home and refer to GI for colonoscopy  . PMB (postmenopausal bleeding) 03/26/2014  . Thickened endometrium 03/26/2014    ?polyp will get HSG  . Endometrial polyp 03/26/2014  . GERD (gastroesophageal reflux disease)   . Dizziness     Past Surgical History  Procedure Laterality Date  . No past surgeries    . Colonoscopy N/A 12/01/2013    Dr. Fields:moderate sized internal hemorrhoids/left colon is redundant  . Esophagogastroduodenoscopy N/A 12/01/2013    WJX:BJYNSLF:mild non erosive gastritis. +H.pylori   . Dilitation & currettage/hystroscopy with novasure ablation N/A 04/18/2014    Procedure: DILATATION & CURETTAGE/HYSTEROSCOPY WITH NOVASURE ENDOMETRIAL ABLATION;  Surgeon: Lazaro ArmsLuther H Eure, MD;  Location: [facility];  Service: Gynecology;  Laterality: N/A;  Uterine Cavity Length 5cm     . Polypectomy N/A 04/18/2014    Procedure: ENDOMETRIAL POLYPECTOMY;  Surgeon:  Lazaro ArmsLuther H Eure, MD;  Location: [facility];  Service: Gynecology;  Laterality: N/A;    Current Outpatient Prescriptions  Medication Sig Dispense Refill  . AMITIZA 24 MCG capsule TAKE ONE CAPSULE BY MOUTH 2 TIMES A DAY WITH A MEAL. 60 capsule 5  . calcium-vitamin D 250-100 MG-UNIT per tablet Take 1 tablet by mouth 2 (two) times daily. Unknown amount taken by patient.    . ferrous sulfate 325 (65 FE) MG tablet Take 325 mg by mouth daily with breakfast.    . loratadine (CLARITIN) 10 MG tablet Take 10 mg by mouth daily.    . Multiple Vitamin (MULTIVITAMIN) tablet Take 1 tablet by mouth daily.    [name]. omeprazole (PRILOSEC) 20 MG capsule TAKE 1 CAPSULE BY MOUTH ONCE DAILY. 30 capsule 5   No current facility-administered medications for this visit.    Allergies as of 05/31/2015  . (No Known Allergies)    Family History  Problem Relation Age of Onset  . Diabetes Maternal Aunt   . Hypertension Maternal Aunt   . Diabetes Maternal Uncle   . Colon cancer Neg Hx     Social History   Social History  . Marital Status: Single    Spouse Name: N/A  . Number of Children: N/A  . Years of Education: N/A   Social History Main Topics  . Smoking status: Never Smoker   . Smokeless tobacco: Never Used     Comment: Never smoked  . Alcohol Use: No  .  Drug Use: No  . Sexual Activity: No   Other Topics Concern  . None   Social History Narrative    Review of Systems: As mentioned in HPI.   Physical Exam: BP 130/81 mmHg  Pulse 83  Temp(Src) 97 F (36.1 C)  Ht  (1.575 m)  Wt 164 lb 12.8 oz (74.753 kg)  BMI 30.13 kg/m2  LMP 04/16/2013 (Approximate) General:   Alert and oriented. No distress noted. Pleasant and cooperative.  Head:  Normocephalic and atraumatic. Eyes:  Conjuctiva clear without scleral icterus. Abdomen:  +BS, soft, non-tender and non-distended. No rebound or guarding. No HSM or masses noted. Msk:  Symmetrical without gross deformities. Normal posture. Extremities:  Without  edema. Neurologic:  Alert and  oriented x4;  grossly normal neurologically. Psych:  Alert and cooperative. Normal mood and affect.

## 2015-05-31 NOTE — Progress Notes (Signed)
CC'ED TO PCP 

## 2015-05-31 NOTE — Patient Instructions (Signed)
Stop Prilosec for 2 weeks, then go to the lab and get your blood work and breath test done. After you finish that, you can restart Prilosec.  Continue Amitiza twice a day with food.  I am so glad you are doing well! I will see you in 4 months

## 2015-05-31 NOTE — Assessment & Plan Note (Signed)
Treated with amoxicillin and biaxin orginally, with positive breath test thereafter. Treated with Pylera. Needs to come off of Prilosec X 2 weeks, then check breath test for eradication. I have also ordered a CBC and iron studies due to history of IDA. She has had some mild weight loss in the setting of dietary modifications. No other concerning features. Return in 4 months.

## 2015-06-22 LAB — CBC
HCT: 43 % (ref 35.0–45.0)
Hemoglobin: 14.2 g/dL (ref 11.7–15.5)
MCH: 28.7 pg (ref 27.0–33.0)
MCHC: 33 g/dL (ref 32.0–36.0)
MCV: 87 fL (ref 80.0–100.0)
MPV: 10.8 fL (ref 7.5–12.5)
PLATELETS: 317 10*3/uL (ref 140–400)
RBC: 4.94 MIL/uL (ref 3.80–5.10)
RDW: 13.6 % (ref 11.0–15.0)
WBC: 7 10*3/uL (ref 3.8–10.8)

## 2015-06-22 LAB — FERRITIN: FERRITIN: 126 ng/mL (ref 10–232)

## 2015-06-22 LAB — IRON AND TIBC
%SAT: 35 % (ref 11–50)
IRON: 107 ug/dL (ref 45–160)
TIBC: 309 ug/dL (ref 250–450)
UIBC: 202 ug/dL (ref 125–400)

## 2015-06-24 LAB — H. PYLORI BREATH TEST: H. PYLORI BREATH TEST: NOT DETECTED

## 2015-06-25 NOTE — Progress Notes (Signed)
Quick Note:  I called and informed caregiver, Sonnie AlamoBillie Jo and she said she will let Bjorn LoserRhonda know also. ______

## 2015-06-25 NOTE — Progress Notes (Signed)
Quick Note:  H.pylori was eradicated! Renee RivalYay! Iron studies normal. IDA resolved. If she is taking iron, she does not need to any more. ______

## 2015-07-19 ENCOUNTER — Other Ambulatory Visit: Payer: Self-pay | Admitting: Nurse Practitioner

## 2015-09-30 ENCOUNTER — Ambulatory Visit (INDEPENDENT_AMBULATORY_CARE_PROVIDER_SITE_OTHER): Payer: Medicaid Other | Admitting: Gastroenterology

## 2015-09-30 ENCOUNTER — Encounter: Payer: Self-pay | Admitting: Gastroenterology

## 2015-09-30 ENCOUNTER — Telehealth: Payer: Self-pay | Admitting: Gastroenterology

## 2015-09-30 VITALS — BP 116/79 | HR 67 | Temp 97.7°F | Ht 65.0 in | Wt 164.4 lb

## 2015-09-30 DIAGNOSIS — R195 Other fecal abnormalities: Secondary | ICD-10-CM | POA: Insufficient documentation

## 2015-09-30 NOTE — Assessment & Plan Note (Signed)
Now with loose stool in the setting of historical constipation. Stop Amitiza 24 mcg for now. Question of taking stool softeners inadvertently. If persistent loose stools despite stopping Amitiza and/or stool softener, check stool studies, as she was exposed to antibiotics for H.pylori treatment. If loose stool resolves and constipation recurs, will need to resume Amitiza at a lower dose. Return in 3 months.

## 2015-09-30 NOTE — Telephone Encounter (Signed)
OV made and letter mailed °

## 2015-09-30 NOTE — Progress Notes (Signed)
Referring Provider: Pearson Grippe, MD Primary Care Physician:  Pearson Grippe, MD Primary GI: Dr. Darrick Penna    Chief Complaint  Patient presents with  . Follow-up    HPI:   Audrey Collier is a 55 y.o. female presenting today with a history of H.pylori gastritis and constipation, here for close follow-up. She was originally treated with amoxicillin and biaxin in 2015, then had a positive breath test after treatment. Prescribed Pylera Nov 2016. She completed a breath test April 2017 and H.pylori was successfully eradicated with Pylera. IDA resolved. History of constipation, on Amitiza 24 mcg po BID.   Weight loss noted after she quit drinking Dr. Reino Kent. Now up 4 lbs from March visit. Having diarrhea all the time. May be taking a stool softener inadvertently but not sure. Takes Amitiza 24 mcg po BID. Caretaker, who is a family member, present with her. States she will check on additional stool softener that she may be taking.   Past Medical History:  Diagnosis Date  . Dizziness   . Endometrial polyp 03/26/2014  . Fecal occult blood test positive 03/15/2013   will send 3 cards home and refer to GI for colonoscopy  . GERD (gastroesophageal reflux disease)   . Mental disorder    mentally challenged  . PMB (postmenopausal bleeding) 03/26/2014  . Thickened endometrium 03/26/2014   ?polyp will get HSG    Past Surgical History:  Procedure Laterality Date  . COLONOSCOPY N/A 12/01/2013   Dr. Fields:moderate sized internal hemorrhoids/left colon is redundant  . DILITATION & CURRETTAGE/HYSTROSCOPY WITH NOVASURE ABLATION N/A 04/18/2014   Procedure: DILATATION & CURETTAGE/HYSTEROSCOPY WITH NOVASURE ENDOMETRIAL ABLATION;  Surgeon: Lazaro Arms, MD;  Location: [facility];  Service: Gynecology;  Laterality: N/A;  Uterine Cavity Length 5cm     . ESOPHAGOGASTRODUODENOSCOPY N/A 12/01/2013   RUE:AVWU non erosive gastritis. +H.pylori   . NO PAST SURGERIES    . POLYPECTOMY N/A 04/18/2014   Procedure: ENDOMETRIAL  POLYPECTOMY;  Surgeon: Lazaro Arms, MD;  Location: [facility];  Service: Gynecology;  Laterality: N/A;    Current Outpatient Prescriptions  Medication Sig Dispense Refill  . AMITIZA 24 MCG capsule TAKE ONE CAPSULE BY MOUTH 2 TIMES A DAY WITH A MEAL. 60 capsule 5  . calcium-vitamin D 250-100 MG-UNIT per tablet Take 1 tablet by mouth 2 (two) times daily. Unknown amount taken by patient.    . ferrous sulfate 325 (65 FE) MG tablet Take 325 mg by mouth daily with breakfast.    . loratadine (CLARITIN) 10 MG tablet Take 10 mg by mouth daily.    . Multiple Vitamin (MULTIVITAMIN) tablet Take 1 tablet by mouth daily.    [name] omeprazole (PRILOSEC) 20 MG capsule TAKE 1 CAPSULE BY MOUTH ONCE DAILY. 30 capsule 5   No current facility-administered medications for this visit.     Allergies as of 09/30/2015  . (No Known Allergies)    Family History  Problem Relation Age of Onset  . Diabetes Maternal Aunt   . Hypertension Maternal Aunt   . Diabetes Maternal Uncle   . Colon cancer Neg Hx     Social History   Social History  . Marital status: Single    Spouse name: N/A  . Number of children: N/A  . Years of education: N/A   Social History Main Topics  . Smoking status: Never Smoker  . Smokeless tobacco: Never Used     Comment: Never smoked  . Alcohol use No  . Drug use: No  .  Sexual activity: No   Other Topics Concern  . None   Social History Narrative  . None    Review of Systems: As mentioned in HPI.   Physical Exam: BP 116/79 (BP Location: Right Arm)   Pulse 67   Temp 97.7 F (36.5 C) (Oral)   Ht 5\' 5"  (1.651 m)   Wt 164 lb 6.4 oz (74.6 kg)   LMP 04/16/2013 (Approximate)   BMI 27.36 kg/m  General:   Alert and oriented. No distress noted. Pleasant and cooperative.  Head:  Normocephalic and atraumatic. Eyes:  Conjuctiva clear without scleral icterus. Abdomen:  +BS, soft, non-tender and non-distended.  Msk:  Symmetrical without gross deformities. Normal  posture. Extremities:  Without edema. Neurologic:  Alert and  oriented x4 Psych:  Alert and cooperative. Normal mood and affect.

## 2015-09-30 NOTE — Telephone Encounter (Signed)
Please have patient return in 3 months. I'm sorry I did not put that on my encounter at time of visit. Thanks!

## 2015-09-30 NOTE — Patient Instructions (Signed)
Stop Amitiza completely. Call me if you are still having diarrhea after a few days.   Call with a progress report in 1 week off the Amitiza.

## 2015-10-01 NOTE — Progress Notes (Signed)
CC'ED TO PCP 

## 2015-12-30 ENCOUNTER — Ambulatory Visit (INDEPENDENT_AMBULATORY_CARE_PROVIDER_SITE_OTHER): Payer: Medicaid Other | Admitting: Gastroenterology

## 2015-12-30 ENCOUNTER — Encounter: Payer: Self-pay | Admitting: Gastroenterology

## 2015-12-30 VITALS — BP 115/72 | HR 87 | Temp 98.0°F | Ht 67.0 in | Wt 162.8 lb

## 2015-12-30 DIAGNOSIS — K5900 Constipation, unspecified: Secondary | ICD-10-CM | POA: Diagnosis not present

## 2015-12-30 NOTE — Progress Notes (Signed)
CC'ED TO PCP 

## 2015-12-30 NOTE — Assessment & Plan Note (Signed)
Resolved. No longer taking Amitiza. No abdominal pain or any concerning GI symptoms. Return in 6 months or sooner if needed.

## 2015-12-30 NOTE — Patient Instructions (Signed)
We will see you in 6 months.   Call me if any problems with constipation, and we will restart the Amitiza. Also, if you have any diarrhea that continues for several days, call us as well.   Continue Prilosec once daily.

## 2015-12-30 NOTE — Progress Notes (Signed)
Referring Provider: Pearson GrippeKim, James, MD Primary Care Physician:  Pearson GrippeJames Kim, MD  Primary GI: Dr. Darrick PennaFields   Chief Complaint  Patient presents with  . Follow-up    HPI:   Audrey Collier is a 55 y.o. female presenting today with a history of H.pylori gastritis and constipation, here for close follow-up. She was originally treated with amoxicillin and biaxin in 2015, then had a positive breath test after treatment. Prescribed Pylera Nov 2016. She completed a breath test April 2017 and H.pylori was successfully eradicated with Pylera. IDA resolved. History of constipation, on Amitiza 24 mcg po BID historically. Had loose stools at last visit and was thought to be taking a stool softener inadvertently. Asked to hold stool softener and Amitiza. Here for follow-up now.  Not taking Amitiza at all anymore. Diarrhea much improved. Will occasionally have a loose stool. Prilosec once daily. No abdominal pain. No N/V. Good appetite. No issues with reflux. Trying to eat healthy, drink water. Drinking green tea.    Past Medical History:  Diagnosis Date  . Dizziness   . Endometrial polyp 03/26/2014  . Fecal occult blood test positive 03/15/2013   will send 3 cards home and refer to GI for colonoscopy  . GERD (gastroesophageal reflux disease)   . Mental disorder    mentally challenged  . PMB (postmenopausal bleeding) 03/26/2014  . Thickened endometrium 03/26/2014   ?polyp will get HSG    Past Surgical History:  Procedure Laterality Date  . COLONOSCOPY N/A 12/01/2013   Dr. Fields:moderate sized internal hemorrhoids/left colon is redundant  . DILITATION & CURRETTAGE/HYSTROSCOPY WITH NOVASURE ABLATION N/A 04/18/2014   Procedure: DILATATION & CURETTAGE/HYSTEROSCOPY WITH NOVASURE ENDOMETRIAL ABLATION;  Surgeon: Lazaro ArmsLuther H Eure, MD;  Location: [facility];  Service: Gynecology;  Laterality: N/A;  Uterine Cavity Length 5cm     . ESOPHAGOGASTRODUODENOSCOPY N/A 12/01/2013   ZOX:WRUESLF:mild non erosive gastritis. +H.pylori   .  NO PAST SURGERIES    . POLYPECTOMY N/A 04/18/2014   Procedure: ENDOMETRIAL POLYPECTOMY;  Surgeon: Lazaro ArmsLuther H Eure, MD;  Location: [facility];  Service: Gynecology;  Laterality: N/A;    Current Outpatient Prescriptions  Medication Sig Dispense Refill  . AMITIZA 24 MCG capsule TAKE ONE CAPSULE BY MOUTH 2 TIMES A DAY WITH A MEAL. 60 capsule 5  . calcium-vitamin D 250-100 MG-UNIT per tablet Take 1 tablet by mouth 2 (two) times daily. Unknown amount taken by patient.    . ferrous sulfate 325 (65 FE) MG tablet Take 325 mg by mouth daily with breakfast.    . loratadine (CLARITIN) 10 MG tablet Take 10 mg by mouth daily.    . Multiple Vitamin (MULTIVITAMIN) tablet Take 1 tablet by mouth daily.    [name]. omeprazole (PRILOSEC) 20 MG capsule TAKE 1 CAPSULE BY MOUTH ONCE DAILY. 30 capsule 5   No current facility-administered medications for this visit.     Allergies as of 12/30/2015  . (No Known Allergies)    Family History  Problem Relation Age of Onset  . Diabetes Maternal Aunt   . Hypertension Maternal Aunt   . Diabetes Maternal Uncle   . Colon cancer Neg Hx     Social History   Social History  . Marital status: Single    Spouse name: N/A  . Number of children: N/A  . Years of education: N/A   Social History Main Topics  . Smoking status: Never Smoker  . Smokeless tobacco: Never Used     Comment: Never smoked  . Alcohol use  No  . Drug use: No  . Sexual activity: No   Other Topics Concern  . None   Social History Narrative  . None    Review of Systems: As mentioned in HPI   Physical Exam: BP 115/72   Pulse 87   Temp 98 F (36.7 C) (Oral)   Ht 5\' 7"  (1.702 m)   Wt 162 lb 12.8 oz (73.8 kg)   LMP 04/16/2013 (Approximate)   BMI 25.50 kg/m  General:   Alert and oriented. No distress noted. Pleasant and cooperative.  Head:  Normocephalic and atraumatic. Eyes:  Conjuctiva clear without scleral icterus. Mouth:  Oral mucosa pink and moist.  Abdomen:  +BS, soft, non-tender and  non-distended. No rebound or guarding. No HSM or masses noted. Msk:  Symmetrical without gross deformities. Normal posture. Extremities:  Without edema. Neurologic:  Alert and  oriented x4;  grossly normal neurologically. Psych:  Alert and cooperative. Normal mood and affect.

## 2016-02-03 ENCOUNTER — Other Ambulatory Visit: Payer: Self-pay | Admitting: Adult Health

## 2016-02-03 DIAGNOSIS — Z1231 Encounter for screening mammogram for malignant neoplasm of breast: Secondary | ICD-10-CM

## 2016-02-28 ENCOUNTER — Ambulatory Visit (HOSPITAL_COMMUNITY)
Admission: RE | Admit: 2016-02-28 | Discharge: 2016-02-28 | Disposition: A | Discharge status: Home / Self Care | Payer: Medicaid Other | Source: Ambulatory Visit | Attending: Adult Health | Admitting: Adult Health

## 2016-02-28 DIAGNOSIS — Z1231 Encounter for screening mammogram for malignant neoplasm of breast: Secondary | ICD-10-CM | POA: Diagnosis not present

## 2016-03-04 ENCOUNTER — Other Ambulatory Visit: Payer: Self-pay | Admitting: Adult Health

## 2016-03-04 DIAGNOSIS — R928 Other abnormal and inconclusive findings on diagnostic imaging of breast: Secondary | ICD-10-CM

## 2016-03-05 ENCOUNTER — Other Ambulatory Visit: Payer: Self-pay | Admitting: Adult Health

## 2016-03-05 DIAGNOSIS — R928 Other abnormal and inconclusive findings on diagnostic imaging of breast: Secondary | ICD-10-CM

## 2016-03-10 ENCOUNTER — Ambulatory Visit (HOSPITAL_COMMUNITY)
Admission: RE | Admit: 2016-03-10 | Discharge: 2016-03-10 | Disposition: A | Discharge status: Home / Self Care | Payer: Medicaid Other | Source: Ambulatory Visit | Attending: Adult Health | Admitting: Adult Health

## 2016-03-10 DIAGNOSIS — R928 Other abnormal and inconclusive findings on diagnostic imaging of breast: Secondary | ICD-10-CM

## 2016-03-10 DIAGNOSIS — N6001 Solitary cyst of right breast: Secondary | ICD-10-CM | POA: Insufficient documentation

## 2016-03-10 DIAGNOSIS — R921 Mammographic calcification found on diagnostic imaging of breast: Secondary | ICD-10-CM | POA: Diagnosis not present

## 2016-06-29 ENCOUNTER — Ambulatory Visit (INDEPENDENT_AMBULATORY_CARE_PROVIDER_SITE_OTHER): Payer: Medicaid Other | Admitting: Gastroenterology

## 2016-06-29 ENCOUNTER — Encounter: Payer: Self-pay | Admitting: Gastroenterology

## 2016-06-29 ENCOUNTER — Telehealth: Payer: Self-pay

## 2016-06-29 VITALS — BP 124/75 | HR 67 | Temp 97.4°F | Ht 62.0 in | Wt 161.8 lb

## 2016-06-29 DIAGNOSIS — R195 Other fecal abnormalities: Secondary | ICD-10-CM

## 2016-06-29 MED ORDER — DICYCLOMINE HCL 10 MG PO CAPS: 10.0000 mg | ORAL_CAPSULE | Freq: Three times a day (TID) | ORAL | 3 refills | Status: DC

## 2016-06-29 NOTE — Progress Notes (Signed)
Referring Provider: Pearson Grippe, MD Primary Care Physician:  Pearson Grippe, MD Primary GI: Dr. Darrick Penna   Chief Complaint  Patient presents with  . Diarrhea    HPI:   Audrey Collier is a 56 y.o. female presenting today with a history of H.pylori gastritis and constipation, here for close follow-up. She was originally treated with amoxicillin and biaxin in 2015, then had a positive breath test after treatment. Prescribed Pylera Nov 2016. She completed a breath test April 2017 and H.pylori was successfully eradicated with Pylera. IDA resolved. History of constipation, on Amitiza 24 mcg po BID historically. However, last July she started having loose stools and the Amitiza and stool softener were held. Seen in October and was without loose stool any longer. No constipation. Presenting today with diarrhea.   Notes postprandial loose stool. Present for "awhile". No recent antibiotics. No sick contacts. Feels improved after BM. Good appetite. No N/V. Might have an accident if doesn't go right away.   Past Medical History:  Diagnosis Date  . Dizziness   . Endometrial polyp 03/26/2014  . Fecal occult blood test positive 03/15/2013   will send 3 cards home and refer to GI for colonoscopy  . GERD (gastroesophageal reflux disease)   . Mental disorder    mentally challenged  . PMB (postmenopausal bleeding) 03/26/2014  . Thickened endometrium 03/26/2014   ?polyp will get HSG    Past Surgical History:  Procedure Laterality Date  . COLONOSCOPY N/A 12/01/2013   Dr. Fields:moderate sized internal hemorrhoids/left colon is redundant  . DILITATION & CURRETTAGE/HYSTROSCOPY WITH NOVASURE ABLATION N/A 04/18/2014   Procedure: DILATATION & CURETTAGE/HYSTEROSCOPY WITH NOVASURE ENDOMETRIAL ABLATION;  Surgeon: Lazaro Arms, MD;  Location: [facility];  Service: Gynecology;  Laterality: N/A;  Uterine Cavity Length 5cm     . ESOPHAGOGASTRODUODENOSCOPY N/A 12/01/2013   UJW:[id number] non erosive gastritis. +H.pylori   . NO  PAST SURGERIES    . POLYPECTOMY N/A 04/18/2014   Procedure: ENDOMETRIAL POLYPECTOMY;  Surgeon: Lazaro Arms, MD;  Location: [facility];  Service: Gynecology;  Laterality: N/A;    Current Outpatient Prescriptions  Medication Sig Dispense Refill  . calcium-vitamin D 250-100 MG-UNIT per tablet Take 1 tablet by mouth 2 (two) times daily. Unknown amount taken by patient.    . ferrous sulfate 325 (65 FE) MG tablet Take 325 mg by mouth daily with breakfast.    . loratadine (CLARITIN) 10 MG tablet Take 10 mg by mouth daily.    . Multiple Vitamin (MULTIVITAMIN) tablet Take 1 tablet by mouth daily.    [name] omeprazole (PRILOSEC) 20 MG capsule TAKE 1 CAPSULE BY MOUTH ONCE DAILY. 30 capsule 5   No current facility-administered medications for this visit.     Allergies as of 06/29/2016  . (No Known Allergies)    Family History  Problem Relation Age of Onset  . Diabetes Maternal Aunt   . Hypertension Maternal Aunt   . Diabetes Maternal Uncle   . Colon cancer Neg Hx     Social History   Social History  . Marital status: Single    Spouse name: N/A  . Number of children: N/A  . Years of education: N/A   Social History Main Topics  . Smoking status: Never Smoker  . Smokeless tobacco: Never Used     Comment: Never smoked  . Alcohol use No  . Drug use: No  . Sexual activity: No   Other Topics Concern  . None   Social History Narrative  .  None    Review of Systems: As mentioned in HPI   Physical Exam: BP 124/75   Pulse 67   Temp 97.4 F (36.3 C) (Oral)   Ht  (1.575 m)   Wt 161 lb 12.8 oz (73.4 kg)   LMP 04/16/2013 (Approximate)   BMI 29.59 kg/m  General:   Alert and oriented. No distress noted. Pleasant and cooperative.  Head:  Normocephalic and atraumatic. Eyes:  Conjuctiva clear without scleral icterus. Mouth:  Oral mucosa pink and moist.  Abdomen:  +BS, soft, non-tender and non-distended. No rebound or guarding. No HSM or masses noted. Msk:  Symmetrical without gross  deformities. Normal posture. Extremities:  Without edema. Neurologic:  Alert and  oriented x4 Psych:  Alert and cooperative. Normal mood and affect.

## 2016-06-29 NOTE — Telephone Encounter (Signed)
Audrey Collier(sitter) called back and said that she has still been taking the Amitiza. The pharmacy was still sending it out and they did not know it. Please advise

## 2016-06-29 NOTE — Assessment & Plan Note (Signed)
56 year old female with baseline history of IBS-C, now with loose stools for an undefined period of time. Doubt infectious process. Will check stool studies to be thorough, start Bentyl, and have her return in 4 weeks for close follow-up.

## 2016-06-29 NOTE — Patient Instructions (Signed)
Please complete the stool studies.   I have sent medication to the pharmacy to take 30 minutes before your meals. You may take it at bedtime as well if needed. Watch for constipation, dry mouth, dizziness.  I would like to see you back in 4 weeks.

## 2016-06-29 NOTE — Telephone Encounter (Signed)
Stop Amitiza. Do not pick up Bentyl from the pharmacy. I am discontinuing that. No need to do stool studies unless diarrhea persists even after stopping Amitiza. I have called [facility] and had them stop both of these.   I DO need an update in about a week with how she is doing. We may need to do Amitiza at a reduced dosing.

## 2016-06-29 NOTE — Progress Notes (Signed)
Received call from caretaker that patient was receiving Amitiza. We will have her stop this. Do not take Bentyl : I have cancelled both at the pharmacy. No need to do stool studies unless diarrhea persists even after stopping Amitiza. Progress report in about a week. Keep appt for 1 month.

## 2016-06-29 NOTE — Telephone Encounter (Signed)
Audrey Collier is aware what AB said

## 2016-06-29 NOTE — Progress Notes (Signed)
cc'ed to pcp °

## 2016-07-23 NOTE — Progress Notes (Signed)
REVIEWED-NO ADDITIONAL RECOMMENDATIONS. 

## 2016-07-24 ENCOUNTER — Encounter: Payer: Self-pay | Admitting: Gastroenterology

## 2016-07-24 ENCOUNTER — Ambulatory Visit (INDEPENDENT_AMBULATORY_CARE_PROVIDER_SITE_OTHER): Payer: Medicaid Other | Admitting: Gastroenterology

## 2016-07-24 VITALS — BP 119/63 | HR 72 | Temp 96.7°F | Wt 164.4 lb

## 2016-07-24 DIAGNOSIS — R195 Other fecal abnormalities: Secondary | ICD-10-CM | POA: Diagnosis not present

## 2016-07-24 NOTE — Assessment & Plan Note (Signed)
56 year old female with baseline history of IBS-C, presenting 4 weeks ago with diarrhea. Since stopping Amitiza, she is doing well. No issues with constipation or diarrhea. Will have her return in 6 months and call in interim if any recurrent issues with constipation or diarrhea.

## 2016-07-24 NOTE — Patient Instructions (Signed)
I am glad your diarrhea is better.   Since you are doing well, we will see you in 6 months!

## 2016-07-24 NOTE — Progress Notes (Signed)
Referring Provider: Pearson Grippe, MD Primary Care Physician:  Pearson Grippe, MD Primary GI: Dr. Darrick Penna   Chief Complaint  Patient presents with  . Follow-up    doing ok    HPI:   Audrey Collier is a 56 y.o. female presenting today with a history of  H.pylori gastritis and constipation, here for close follow-up. She was originally treated with amoxicillin and biaxin in 2015, then had a positive breath test after treatment. Prescribed Pylera Nov 2016. She completed a breath test April 2017 and H.pylori was successfully eradicated with Pylera. IDA resolved. History of constipation, on Amitiza 24 mcg po BID historically. However, last July she started having loose stools and the Amitiza and stool softener were held. Seen in October and was without loose stool any longer. No constipation. Returned again in April with recurrent diarrhea, but she had been taking Amitiza BID. She was asked to hold this and is here for follow-up.   Diarrhea resolved. BM daily. Soft BM. No abdominal pain. No N/V. Good appetite. Weight stable from a year ago.   Past Medical History:  Diagnosis Date  . Dizziness   . Endometrial polyp 03/26/2014  . Fecal occult blood test positive 03/15/2013   will send 3 cards home and refer to GI for colonoscopy  . GERD (gastroesophageal reflux disease)   . Mental disorder    mentally challenged  . PMB (postmenopausal bleeding) 03/26/2014  . Thickened endometrium 03/26/2014   ?polyp will get HSG    Past Surgical History:  Procedure Laterality Date  . COLONOSCOPY N/A 12/01/2013   Dr. Fields:moderate sized internal hemorrhoids/left colon is redundant  . DILITATION & CURRETTAGE/HYSTROSCOPY WITH NOVASURE ABLATION N/A 04/18/2014   Procedure: DILATATION & CURETTAGE/HYSTEROSCOPY WITH NOVASURE ENDOMETRIAL ABLATION;  Surgeon: Lazaro Arms, MD;  Location: [facility];  Service: Gynecology;  Laterality: N/A;  Uterine Cavity Length 5cm     . ESOPHAGOGASTRODUODENOSCOPY N/A 12/01/2013   ZOX:WRUE  non erosive gastritis. +H.pylori   . NO PAST SURGERIES    . POLYPECTOMY N/A 04/18/2014   Procedure: ENDOMETRIAL POLYPECTOMY;  Surgeon: Lazaro Arms, MD;  Location: [facility];  Service: Gynecology;  Laterality: N/A;    Current Outpatient Prescriptions  Medication Sig Dispense Refill  . calcium-vitamin D 250-100 MG-UNIT per tablet Take 1 tablet by mouth 2 (two) times daily. Unknown amount taken by patient.    . ferrous sulfate 325 (65 FE) MG tablet Take 325 mg by mouth daily with breakfast.    . loratadine (CLARITIN) 10 MG tablet Take 10 mg by mouth daily.    . Multiple Vitamin (MULTIVITAMIN) tablet Take 1 tablet by mouth daily.    [name] omeprazole (PRILOSEC) 20 MG capsule TAKE 1 CAPSULE BY MOUTH ONCE DAILY. 30 capsule 5   No current facility-administered medications for this visit.     Allergies as of 07/24/2016  . (No Known Allergies)    Family History  Problem Relation Age of Onset  . Diabetes Maternal Aunt   . Hypertension Maternal Aunt   . Diabetes Maternal Uncle   . Colon cancer Neg Hx     Social History   Social History  . Marital status: Single    Spouse name: N/A  . Number of children: N/A  . Years of education: N/A   Social History Main Topics  . Smoking status: Never Smoker  . Smokeless tobacco: Never Used     Comment: Never smoked  . Alcohol use No  . Drug use: No  . Sexual  activity: No   Other Topics Concern  . None   Social History Narrative  . None    Review of Systems: Limited due to cognitive status.   Physical Exam: BP 119/63   Pulse 72   Temp (!) 96.7 F (35.9 C) (Oral)   Wt 164 lb 6.4 oz (74.6 kg)   LMP 04/16/2013 (Approximate)   BMI 30.07 kg/m  General:   Alert and oriented. No distress noted. Pleasant and cooperative.  Head:  Normocephalic and atraumatic. Eyes:  Conjuctiva clear without scleral icterus. Heart:  S1, S2 present without murmurs, rubs, or gallops. Regular rate and rhythm. Abdomen:  +BS, soft, non-tender and non-distended.  No rebound or guarding. Umbilical hernia noted.  Msk:  Symmetrical without gross deformities. Normal posture. Extremities:  Without edema. Neurologic:  Alert and  oriented x4;  grossly normal neurologically. Psych:  Alert and cooperative. Normal mood and affect.

## 2016-07-28 NOTE — Progress Notes (Signed)
cc'ed to pcp °

## 2016-10-09 ENCOUNTER — Encounter (HOSPITAL_COMMUNITY): Payer: Self-pay | Admitting: Cardiology

## 2016-10-09 ENCOUNTER — Emergency Department (HOSPITAL_COMMUNITY)
Admission: EM | Admit: 2016-10-09 | Discharge: 2016-10-09 | Disposition: A | Discharge status: Home / Self Care | Payer: Medicaid Other | Attending: Emergency Medicine | Admitting: Emergency Medicine

## 2016-10-09 ENCOUNTER — Emergency Department (HOSPITAL_COMMUNITY): Payer: Medicaid Other

## 2016-10-09 DIAGNOSIS — Y929 Unspecified place or not applicable: Secondary | ICD-10-CM | POA: Insufficient documentation

## 2016-10-09 DIAGNOSIS — Y999 Unspecified external cause status: Secondary | ICD-10-CM | POA: Diagnosis not present

## 2016-10-09 DIAGNOSIS — Z79899 Other long term (current) drug therapy: Secondary | ICD-10-CM | POA: Diagnosis not present

## 2016-10-09 DIAGNOSIS — S5001XA Contusion of right elbow, initial encounter: Secondary | ICD-10-CM | POA: Diagnosis not present

## 2016-10-09 DIAGNOSIS — S50901A Unspecified superficial injury of right elbow, initial encounter: Secondary | ICD-10-CM | POA: Diagnosis present

## 2016-10-09 DIAGNOSIS — W08XXXA Fall from other furniture, initial encounter: Secondary | ICD-10-CM | POA: Insufficient documentation

## 2016-10-09 DIAGNOSIS — Y939 Activity, unspecified: Secondary | ICD-10-CM | POA: Insufficient documentation

## 2016-10-09 MED ORDER — IBUPROFEN 600 MG PO TABS: 600.0000 mg | ORAL_TABLET | Freq: Four times a day (QID) | ORAL | 0 refills | Status: DC | PRN

## 2016-10-09 NOTE — ED Provider Notes (Signed)
[facility] Provider Note   CSN: [id number] Arrival date & time: 10/09/16  1110     History   Chief Complaint Chief Complaint  Patient presents with  . Fall    HPI Audrey Collier is a 56 y.o. female.  HPI  Audrey Collier is a 56 y.o. female who presents to the Emergency Department complaining of pain and swelling to the right elbow.  She states that she fell off a small stool one day prior to arrival.  She describes pain to her elbow associated with movement and palpation.  She has applied ice without relief.  She denies numbness, shoulder pain, neck pain, head injury and LOC.    Past Medical History:  Diagnosis Date  . Dizziness   . Endometrial polyp 03/26/2014  . Fecal occult blood test positive 03/15/2013   will send 3 cards home and refer to GI for colonoscopy  . GERD (gastroesophageal reflux disease)   . Mental disorder    mentally challenged  . PMB (postmenopausal bleeding) 03/26/2014  . Thickened endometrium 03/26/2014   ?polyp will get HSG    Patient Active Problem List   Diagnosis Date Noted  . Loose stools 09/30/2015  . Abdominal pain 11/30/2014  . Helicobacter pylori gastritis 10/24/2014  . PMB (postmenopausal bleeding) 03/26/2014  . Thickened endometrium 03/26/2014  . Endometrial polyp 03/26/2014  . Constipation 04/11/2013  . Fecal occult blood test positive 03/15/2013    Past Surgical History:  Procedure Laterality Date  . COLONOSCOPY N/A 12/01/2013   Dr. Fields:moderate sized internal hemorrhoids/left colon is redundant  . DILITATION & CURRETTAGE/HYSTROSCOPY WITH NOVASURE ABLATION N/A 04/18/2014   Procedure: DILATATION & CURETTAGE/HYSTEROSCOPY WITH NOVASURE ENDOMETRIAL ABLATION;  Surgeon: Lazaro ArmsLuther H Eure, MD;  Location: [facility];  Service: Gynecology;  Laterality: N/A;  Uterine Cavity Length 5cm     . ESOPHAGOGASTRODUODENOSCOPY N/A 12/01/2013   YQM:VHQISLF:mild non erosive gastritis. +H.pylori   . NO PAST SURGERIES    . POLYPECTOMY N/A 04/18/2014   Procedure: ENDOMETRIAL POLYPECTOMY;  Surgeon: Lazaro ArmsLuther H Eure, MD;  Location: [facility];  Service: Gynecology;  Laterality: N/A;    OB History    Gravida Para Term Preterm AB Living   0 0 0 0 0 0   SAB TAB Ectopic Multiple Live Births   0 0 0 0         Home Medications    Prior to Admission medications   Medication Sig Start Date End Date Taking? Authorizing Provider  calcium-vitamin D 250-100 MG-UNIT per tablet Take 1 tablet by mouth 2 (two) times daily. Unknown amount taken by patient.   Yes [provider]  ferrous sulfate 325 (65 FE) MG tablet Take 325 mg by mouth daily with breakfast.   Yes [provider]  FLUoxetine (PROZAC) 10 MG tablet Take 10 mg by mouth daily.   Yes [provider]  levothyroxine (SYNTHROID, LEVOTHROID) 100 MCG tablet Take 100 mcg by mouth daily before breakfast.   Yes [provider]  loratadine (CLARITIN) 10 MG tablet Take 10 mg by mouth daily.   Yes [provider]  Multiple Vitamin (MULTIVITAMIN) tablet Take 1 tablet by mouth daily.   Yes [provider]  omeprazole (PRILOSEC) 20 MG capsule TAKE 1 CAPSULE BY MOUTH ONCE DAILY. 07/23/15  Yes Gelene MinkBoone, Anna W, NP    Family History Family History  Problem Relation Age of Onset  . Diabetes Maternal Aunt   . Hypertension Maternal Aunt   . Diabetes Maternal Uncle   .  Colon cancer Neg Hx     Social History Social History  Substance Use Topics  . Smoking status: Never Smoker  . Smokeless tobacco: Never Used     Comment: Never smoked  . Alcohol use No     Allergies   Patient has no known allergies.   Review of Systems Review of Systems  Constitutional: Negative for chills and fever.  Gastrointestinal: Negative for nausea and vomiting.  Musculoskeletal: Positive for arthralgias (right elbow pain). Negative for back pain, joint swelling and neck pain.  Skin: Positive for color change. Negative for wound.  Neurological: Negative for dizziness,  syncope, weakness, numbness and headaches.  All other systems reviewed and are negative.    Physical Exam Updated Vital Signs BP 140/68   Pulse 80   Temp 98.2 F (36.8 C) (Oral)   Resp 16   Ht 5\' 2"  (1.575 m)   Wt 74.4 kg (164 lb)   LMP 04/16/2013 (Approximate)   SpO2 96%   BMI 30.00 kg/m   Physical Exam  Constitutional: She is oriented to person, place, and time. She appears well-developed and well-nourished. No distress.  HENT:  Head: Normocephalic and atraumatic.  Cardiovascular: Normal rate, regular rhythm and intact distal pulses.   Pulmonary/Chest: Effort normal and breath sounds normal.  Musculoskeletal: She exhibits tenderness. She exhibits no edema or deformity.  ttp of the olecranon process of the right elbow.  No significant edema noted.  Pt has full ROM of the joint.  No proximal or distal tenderness.    Neurological: She is alert and oriented to person, place, and time. She exhibits normal muscle tone. Coordination normal.  Skin: Skin is warm and dry. Capillary refill takes less than 2 seconds.  Mild bruising of the posterior right elbow.  No open wounds  Psychiatric: She has a normal mood and affect.  Nursing note and vitals reviewed.    ED Treatments / Results  Labs (all labs ordered are listed, but only abnormal results are displayed) Labs Reviewed - No data to display  EKG  EKG Interpretation None       Radiology Dg Elbow Complete Right  Result Date: 10/09/2016 CLINICAL DATA:  Pain following fall EXAM: RIGHT ELBOW - COMPLETE 3+ VIEW COMPARISON:  None. FINDINGS: Frontal, lateral, and bilateral oblique views were obtained. There is no evident fracture or dislocation. No joint effusion. There is no appreciable joint space narrowing or erosion. There is a minimal spur along the coracoid process of the proximal ulna. IMPRESSION: Minimal spur along the coracoid process of the proximal ulna. No appreciable joint space narrowing. No fracture or dislocation.  Electronically Signed   By: Bretta Bang III M.D.   On: 10/09/2016 13:00    Procedures Procedures (including critical care time)  Medications Ordered in ED Medications - No data to display   Initial Impression / Assessment and Plan / ED Course  I have reviewed the triage vital signs and the nursing notes.  Pertinent labs & imaging results that were available during my care of the patient were reviewed by me and considered in my medical decision making (see chart for details).     Pt has full ROM of the elbow.  Likely contusion.  XR neg for fx or disloc.  Pt agrees to ice, ibuprofen.    Final Clinical Impressions(s) / ED Diagnoses   Final diagnoses:  Contusion of right elbow, initial encounter    New Prescriptions New Prescriptions   No medications on file  Pauline Aus, PA-C 10/09/16 1309    Samuel Jester, DO 10/11/16 [phone]

## 2016-10-09 NOTE — Discharge Instructions (Signed)
Apply ice packs on/off to your elbow.  Call Dr. Mort SawyersHArrison's office to arrange a follow-up appt in one week if not improving

## 2016-10-09 NOTE — ED Triage Notes (Signed)
Fall yesterday   Complaint of R arm pain today  Dr Selena BattenKim is PCP

## 2016-10-09 NOTE — ED Triage Notes (Addendum)
Stool slid out from under her yesterday.  Fell in floor.  C/o bruising to right elbow.  Pt moving arm without difficulty.  Denies any pain.

## 2016-10-16 NOTE — Progress Notes (Signed)
REVIEWED-NO ADDITIONAL RECOMMENDATIONS. 

## 2017-01-26 ENCOUNTER — Ambulatory Visit: Payer: Medicaid Other | Admitting: Gastroenterology

## 2017-03-18 ENCOUNTER — Ambulatory Visit: Payer: Medicaid Other | Admitting: Gastroenterology

## 2017-03-18 ENCOUNTER — Encounter: Payer: Self-pay | Admitting: Gastroenterology

## 2017-03-18 DIAGNOSIS — K219 Gastro-esophageal reflux disease without esophagitis: Secondary | ICD-10-CM | POA: Diagnosis not present

## 2017-03-18 NOTE — Progress Notes (Signed)
CC'D TO PCP °

## 2017-03-18 NOTE — Assessment & Plan Note (Signed)
57 year old female doing well on Prilosec daily. No upper or lower GI concerns. Return in 1 year.

## 2017-03-18 NOTE — Progress Notes (Signed)
Referring Provider: Pearson Grippe, MD Primary Care Physician:  Pearson Grippe, MD Primary GI: Dr. Darrick Penna   Chief Complaint  Patient presents with  . Diarrhea    f/u; doing ok    HPI:   Audrey Collier is a 57 y.o. female presenting today with a history of H.pylori gastritis and constipation, here for close follow-up. She was originally treated with amoxicillin and biaxin in 2015, then had a positive breath test after treatment. Prescribed Pylera Nov 2016. She completed a breath test April 2017 and H.pylori was successfully eradicated with Pylera. IDA resolved.   History of constipation but now doing well without need for any agents. No abdominal pain, N/V, diarrhea, constipation, rectal bleeding. No dysphagia. Good appetite. Weight stable. Family member present with her today. Confirms she is doing well.   Past Medical History:  Diagnosis Date  . Dizziness   . Endometrial polyp 03/26/2014  . Fecal occult blood test positive 03/15/2013   will send 3 cards home and refer to GI for colonoscopy  . GERD (gastroesophageal reflux disease)   . Mental disorder    mentally challenged  . PMB (postmenopausal bleeding) 03/26/2014  . Thickened endometrium 03/26/2014   ?polyp will get HSG    Past Surgical History:  Procedure Laterality Date  . COLONOSCOPY N/A 12/01/2013   Dr. Fields:moderate sized internal hemorrhoids/left colon is redundant  . DILITATION & CURRETTAGE/HYSTROSCOPY WITH NOVASURE ABLATION N/A 04/18/2014   Procedure: DILATATION & CURETTAGE/HYSTEROSCOPY WITH NOVASURE ENDOMETRIAL ABLATION;  Surgeon: Lazaro Arms, MD;  Location: [facility];  Service: Gynecology;  Laterality: N/A;  Uterine Cavity Length 5cm     . ESOPHAGOGASTRODUODENOSCOPY N/A 12/01/2013   ZOX:WRUE non erosive gastritis. +H.pylori   . NO PAST SURGERIES    . POLYPECTOMY N/A 04/18/2014   Procedure: ENDOMETRIAL POLYPECTOMY;  Surgeon: Lazaro Arms, MD;  Location: [facility];  Service: Gynecology;  Laterality: N/A;    Current  Outpatient Medications  Medication Sig Dispense Refill  . calcium-vitamin D 250-100 MG-UNIT per tablet Take 1 tablet by mouth 2 (two) times daily. Unknown amount taken by patient.    . ferrous sulfate 325 (65 FE) MG tablet Take 325 mg by mouth daily with breakfast.    . FLUoxetine (PROZAC) 10 MG tablet Take 10 mg by mouth daily.    [name] ibuprofen (ADVIL,MOTRIN) 600 MG tablet Take 1 tablet (600 mg total) by mouth every 6 (six) hours as needed. Take with food 21 tablet 0  . levothyroxine (SYNTHROID, LEVOTHROID) 100 MCG tablet Take 100 mcg by mouth daily before breakfast.    . loratadine (CLARITIN) 10 MG tablet Take 10 mg by mouth daily.    . Multiple Vitamin (MULTIVITAMIN) tablet Take 1 tablet by mouth daily.    [name] omeprazole (PRILOSEC) 20 MG capsule TAKE 1 CAPSULE BY MOUTH ONCE DAILY. 30 capsule 5   No current facility-administered medications for this visit.     Allergies as of 03/18/2017  . (No Known Allergies)    Family History  Problem Relation Age of Onset  . Diabetes Maternal Aunt   . Hypertension Maternal Aunt   . Diabetes Maternal Uncle   . Colon cancer Neg Hx     Social History   Socioeconomic History  . Marital status: Single    Spouse name: None  . Number of children: None  . Years of education: None  . Highest education level: None  Social Needs  . Financial resource strain: None  . Food insecurity - worry: None  .  Food insecurity - inability: None  . Transportation needs - medical: None  . Transportation needs - non-medical: None  Occupational History  . None  Tobacco Use  . Smoking status: Never Smoker  . Smokeless tobacco: Never Used  . Tobacco comment: Never smoked  Substance and Sexual Activity  . Alcohol use: No    Alcohol/week: 0.0 oz  . Drug use: No  . Sexual activity: No  Other Topics Concern  . None  Social History Narrative  . None    Review of Systems: Gen: Denies fever, chills, anorexia. Denies fatigue, weakness, weight loss.  CV: Denies  chest pain, palpitations, syncope, peripheral edema, and claudication. Resp: Denies dyspnea at rest, cough, wheezing, coughing up blood, and pleurisy. GI: see HPI  Derm: Denies rash, itching, dry skin Psych: Denies depression, anxiety, memory loss, confusion. No homicidal or suicidal ideation.  Heme: Denies bruising, bleeding, and enlarged lymph nodes.  Physical Exam: BP 115/83   Pulse 84   Temp (!) 97.4 F (36.3 C) (Oral)   Ht 5\' 2"  (1.575 m)   Wt 163 lb 6.4 oz (74.1 kg)   LMP 04/16/2013 (Approximate)   BMI 29.89 kg/m  General:   Alert and oriented. No distress noted. Pleasant and cooperative.  Head:  Normocephalic and atraumatic. Eyes:  Conjuctiva clear without scleral icterus. Mouth:  Oral mucosa pink and moist.  Abdomen:  +BS, soft, non-tender and non-distended. No rebound or guarding. No HSM or masses noted. Msk:  Symmetrical without gross deformities. Normal posture. Extremities:  Without edema. Neurologic:  Alert and  oriented x4 Psych:  Alert and cooperative. Normal mood and affect.

## 2017-03-18 NOTE — Patient Instructions (Signed)
We will see you back in 1 year!  Please let me know if you need anything in the meantime!

## 2017-06-29 ENCOUNTER — Other Ambulatory Visit: Payer: Self-pay | Admitting: Adult Health

## 2017-06-29 DIAGNOSIS — Z1231 Encounter for screening mammogram for malignant neoplasm of breast: Secondary | ICD-10-CM

## 2017-07-01 ENCOUNTER — Ambulatory Visit (HOSPITAL_COMMUNITY): Payer: Medicaid Other

## 2017-07-01 ENCOUNTER — Ambulatory Visit (HOSPITAL_COMMUNITY)
Admission: RE | Admit: 2017-07-01 | Discharge: 2017-07-01 | Disposition: A | Discharge status: Home / Self Care | Payer: Medicaid Other | Source: Ambulatory Visit | Attending: Adult Health | Admitting: Adult Health

## 2017-07-01 DIAGNOSIS — Z1231 Encounter for screening mammogram for malignant neoplasm of breast: Secondary | ICD-10-CM

## 2017-07-29 NOTE — Patient Instructions (Signed)
Audrey Collier  07/29/2017     @   Your procedure is scheduled on 08/06/2017.  Report to [facility] at 9:00 A.M.  Call this number if you have problems the morning of surgery:  [phone]   Remember:  No food or drink after midnight.      Take these medicines the morning of surgery with A SIP OF WATER Zyrtec, Prozac, Synthroid, Prilosec    Do not wear jewelry, make-up or nail polish.  Do not wear lotions, powders, or perfumes, or deodorant.  Do not shave 48 hours prior to surgery.  Men may shave face and neck.  Do not bring valuables to the hospital.  [facility] is not responsible for any belongings or valuables.  Contacts, dentures or bridgework may not be worn into surgery.  Leave your suitcase in the car.  After surgery it may be brought to your room.  For patients admitted to the hospital, discharge time will be determined by your treatment team.  Patients discharged the day of surgery will not be allowed to drive home.    Please read over the following fact sheets that you were given. Anesthesia Post-op Instructions     PATIENT INSTRUCTIONS POST-ANESTHESIA  IMMEDIATELY FOLLOWING SURGERY:  Do not drive or operate machinery for the first twenty four hours after surgery.  Do not make any important decisions for twenty four hours after surgery or while taking narcotic pain medications or sedatives.  If you develop intractable nausea and vomiting or a severe headache please notify your doctor immediately.  FOLLOW-UP:  Please make an appointment with your surgeon as instructed. You do not need to follow up with anesthesia unless specifically instructed to do so.  WOUND CARE INSTRUCTIONS (if applicable):  Keep a dry clean dressing on the anesthesia/puncture wound site if there is drainage.  Once the wound has quit draining you may leave it open to air.  Generally you should leave the bandage intact for twenty four hours unless there is drainage.  If the  epidural site drains for more than 36-48 hours please call the anesthesia department.  QUESTIONS?:  Please feel free to call your physician or the hospital operator if you have any questions, and they will be happy to assist you.      Cataract Surgery Cataract surgery is a procedure to remove a cataract from your eye. A cataract is cloudiness on the lens of your eye. The lens focuses light inside the eye. When a lens becomes cloudy, your vision is affected. Cataract surgery is a procedure to remove the cloudy lens. A substitute lens (intraocular lens or IOL) is usually inserted as a replacement for the cloudy lens. Tell a health care provider about:  Any allergies you have.  All medicines you are taking, including vitamins, herbs, eye drops, creams, and over-the-counter medicines.  Any problems you or family members have had with anesthetic medicines.  Any blood disorders you have.  Any surgeries you have had, especially eye surgeries that include refractive surgery, such as PRK and LASIK.  Any medical conditions you have.  Whether you are pregnant or may be pregnant. What are the risks? Generally, this is a safe procedure. However, problems may occur, including:  Infection.  Bleeding.  Glaucoma.  Retinal detachment.  Allergic reactions to medicines.  Damage to other structures or organs.  Inflammation of the eye.  Clouding of the part of your eye that holds an IOL in place (after-cataract), if an IOL was inserted.  This is fairly common.  An IOL moving out of position, if an IOL was inserted. This is very rare.  Loss of vision. This is rare.  What happens before the procedure?  Follow instructions from your health care provider about eating or drinking restrictions.  Ask your health care provider about: ? Changing or stopping your regular medicines, including any eye drops you have been prescribed. This is especially important if you are taking diabetes medicines or  blood thinners. ? Taking medicines such as aspirin and ibuprofen. These medicines can thin your blood. Do not take these medicines before your procedure if your health care provider instructs you not to.  Do not put contact lenses in either eye on the day of your surgery.  Plan for someone to drive you to and from the procedure.  If you will be going home right after the procedure, plan to have someone with you for 24 hours. What happens during the procedure?  An IV tube may be inserted into one of your veins.  You will be given one or more of the following: ? A medicine to help you relax (sedative). ? A medicine to numb the area (local anesthetic). This may be numbing eye drops or an injection that is given behind the eye.  A small cut (incision) will be made to the edge of the clear, dome-shaped surface that covers the front of the eye (cornea).  A small probe will be inserted into the eye. This device gives off ultrasound waves that soften and break up the cloudy center of the lens. This makes it easier for the cloudy lens to be removed by suction.  An IOL may be implanted.  Part of the capsule that surrounds the lens will be left in the eye to support the IOL.  Your surgeon may use stitches (sutures) to close the incision. The procedure may vary among health care providers and hospitals. What happens after the procedure?  Your blood pressure, heart rate, breathing rate, and blood oxygen level will be monitored often until the medicines you were given have worn off.  You may be given a protective shield to wear over your eyes.  Do not drive for 24 hours if you received a sedative. This information is not intended to replace advice given to you by your health care provider. Make sure you discuss any questions you have with your health care provider. Document Released: 02/05/2011 Document Revised: 07/25/2015 Document Reviewed: 12/27/2014 Elsevier Interactive Patient Education   [employer].

## 2017-07-30 ENCOUNTER — Encounter (HOSPITAL_COMMUNITY)
Admission: RE | Admit: 2017-07-30 | Discharge: 2017-07-30 | Disposition: A | Discharge status: Home / Self Care | Payer: Medicaid Other | Source: Ambulatory Visit | Attending: Ophthalmology | Admitting: Ophthalmology

## 2017-07-30 ENCOUNTER — Other Ambulatory Visit: Payer: Self-pay

## 2017-07-30 ENCOUNTER — Encounter (HOSPITAL_COMMUNITY): Payer: Self-pay

## 2017-07-30 DIAGNOSIS — Z01810 Encounter for preprocedural cardiovascular examination: Secondary | ICD-10-CM | POA: Diagnosis present

## 2017-07-30 DIAGNOSIS — Z01812 Encounter for preprocedural laboratory examination: Secondary | ICD-10-CM | POA: Diagnosis present

## 2017-07-30 HISTORY — DX: Hypothyroidism, unspecified: E03.9

## 2017-07-30 HISTORY — DX: Anxiety disorder, unspecified: F41.9

## 2017-07-30 LAB — CBC
HCT: 41.9 % (ref 36.0–46.0)
HEMOGLOBIN: 13.4 g/dL (ref 12.0–15.0)
MCH: 27.7 pg (ref 26.0–34.0)
MCHC: 32 g/dL (ref 30.0–36.0)
MCV: 86.6 fL (ref 78.0–100.0)
Platelets: 318 10*3/uL (ref 150–400)
RBC: 4.84 MIL/uL (ref 3.87–5.11)
RDW: 13.1 % (ref 11.5–15.5)
WBC: 9.1 10*3/uL (ref 4.0–10.5)

## 2017-07-30 LAB — BASIC METABOLIC PANEL
ANION GAP: 8 (ref 5–15)
BUN: 12 mg/dL (ref 6–20)
CHLORIDE: 101 mmol/L (ref 101–111)
CO2: 30 mmol/L (ref 22–32)
CREATININE: 0.84 mg/dL (ref 0.44–1.00)
Calcium: 9.7 mg/dL (ref 8.9–10.3)
GFR calc non Af Amer: >60 mL/min (ref >60)
Glucose, Bld: 93 mg/dL (ref 65–99)
POTASSIUM: 4.2 mmol/L (ref 3.5–5.1)
SODIUM: 139 mmol/L (ref 135–145)

## 2017-08-03 ENCOUNTER — Other Ambulatory Visit (HOSPITAL_COMMUNITY)
Admission: RE | Admit: 2017-08-03 | Discharge: 2017-08-03 | Disposition: A | Discharge status: Home / Self Care | Payer: Medicaid Other | Source: Ambulatory Visit | Attending: Adult Health | Admitting: Adult Health

## 2017-08-03 ENCOUNTER — Encounter: Payer: Self-pay | Admitting: Adult Health

## 2017-08-03 ENCOUNTER — Ambulatory Visit: Payer: Medicaid Other | Admitting: Adult Health

## 2017-08-03 VITALS — BP 126/71 | HR 79 | Ht 62.0 in | Wt 168.4 lb

## 2017-08-03 DIAGNOSIS — Z0000 Encounter for general adult medical examination without abnormal findings: Secondary | ICD-10-CM

## 2017-08-03 DIAGNOSIS — Z01419 Encounter for gynecological examination (general) (routine) without abnormal findings: Secondary | ICD-10-CM | POA: Diagnosis not present

## 2017-08-03 DIAGNOSIS — Z1212 Encounter for screening for malignant neoplasm of rectum: Secondary | ICD-10-CM | POA: Diagnosis not present

## 2017-08-03 DIAGNOSIS — Z1211 Encounter for screening for malignant neoplasm of colon: Secondary | ICD-10-CM | POA: Diagnosis not present

## 2017-08-03 LAB — HEMOCCULT GUIAC POC 1CARD (OFFICE): FECAL OCCULT BLD: NEGATIVE

## 2017-08-03 NOTE — Progress Notes (Addendum)
Patient ID: Audrey Collier, female   DOB: 17-Jul-1960, 57 y.o.   MRN: [id number] History of Present Illness: Audrey HatchetSheila is a 57 year old white female,PM with MR in for well woman gyn exam and pap.She is going to have cataract surgery in near future. Her sister in law is with her and that she who she resides with.  PCP is Dr Selena BattenKim.   Current Medications, Allergies, Past Medical History, Past Surgical History, Family History and Social History were reviewed in [facility] electronic medical record.     Review of Systems: Patient denies any headaches, hearing loss, fatigue, blurred vision, shortness of breath, chest pain, abdominal pain, problems with bowel movements, urination, or intercourse(never had sex). No joint pain or mood swings.    Physical Exam:BP 126/71 (BP Location: Right Arm, Patient Position: Sitting, Cuff Size: Small)   Pulse 79   Ht 5\' 2"  (1.575 m)   Wt 168 lb 6.4 oz (76.4 kg)   LMP 04/16/2013 (Approximate)   BMI 30.80 kg/m  General:  Well developed, well nourished, no acute distress Skin:  Warm and dry,has had teeth pulled and is getting dentures  Neck:  Midline trachea, normal thyroid, good ROM, no lymphadenopathy Lungs; Clear to auscultation bilaterally Breast:  No dominant palpable mass, retraction, or nipple discharge Cardiovascular: Regular rate and rhythm Abdomen:  Soft, non tender, no hepatosplenomegaly Pelvic:  External genitalia is normal in appearance, no lesions.  The vagina is normal in appearance. Urethra has no lesions or masses. The cervix is not seen, pap with HPV done blind sweep. Uterus is felt to be normal size, shape, and contour.  No adnexal masses or tenderness noted.Bladder is non tender, no masses felt. Rectal: Good sphincter tone, no polyps, or hemorrhoids felt.  Hemoccult negative. Extremities/musculoskeletal:  No swelling or varicosities noted, no clubbing or cyanosis Psych:  No mood changes, alert and cooperative,seems happy PHQ 9 score  0.  Impression: 1. Encounter for routine gynecological examination with Papanicolaou smear of cervix   2. Screening for colorectal cancer       Plan: Pap and physical in 3 years Labs with PCP Mammogram yearly Colonoscopy per GI.

## 2017-08-03 NOTE — Addendum Note (Signed)
Addended by: Cyril MourningGRIFFIN, JENNIFER A on: 08/03/2017 02:26 PM   Modules accepted: Level of Service

## 2017-08-05 LAB — CYTOLOGY - PAP
Diagnosis: NEGATIVE
HPV (WINDOPATH): NOT DETECTED

## 2017-08-06 ENCOUNTER — Encounter (HOSPITAL_COMMUNITY)
Admission: RE | Disposition: A | Discharge status: Home / Self Care | Payer: Self-pay | Source: Ambulatory Visit | Attending: Ophthalmology

## 2017-08-06 ENCOUNTER — Ambulatory Visit (HOSPITAL_COMMUNITY)
Admission: RE | Admit: 2017-08-06 | Discharge: 2017-08-06 | Disposition: A | Discharge status: Home / Self Care | Payer: Medicaid Other | Source: Ambulatory Visit | Attending: Ophthalmology | Admitting: Ophthalmology

## 2017-08-06 ENCOUNTER — Ambulatory Visit (HOSPITAL_COMMUNITY): Payer: Medicaid Other | Admitting: Anesthesiology

## 2017-08-06 ENCOUNTER — Encounter (HOSPITAL_COMMUNITY): Payer: Self-pay | Admitting: *Deleted

## 2017-08-06 DIAGNOSIS — H25812 Combined forms of age-related cataract, left eye: Secondary | ICD-10-CM | POA: Insufficient documentation

## 2017-08-06 DIAGNOSIS — E039 Hypothyroidism, unspecified: Secondary | ICD-10-CM | POA: Diagnosis not present

## 2017-08-06 DIAGNOSIS — F419 Anxiety disorder, unspecified: Secondary | ICD-10-CM | POA: Insufficient documentation

## 2017-08-06 DIAGNOSIS — Z79899 Other long term (current) drug therapy: Secondary | ICD-10-CM | POA: Diagnosis not present

## 2017-08-06 DIAGNOSIS — K219 Gastro-esophageal reflux disease without esophagitis: Secondary | ICD-10-CM | POA: Diagnosis not present

## 2017-08-06 HISTORY — PX: CATARACT EXTRACTION W/PHACO: SHX586

## 2017-08-06 SURGERY — PHACOEMULSIFICATION, CATARACT, WITH IOL INSERTION
Anesthesia: Monitor Anesthesia Care | Site: Eye | Laterality: Left

## 2017-08-06 MED ORDER — PROPOFOL 10 MG/ML IV BOLUS
INTRAVENOUS | Status: DC | PRN
  Administered 2017-08-06: 10 mg via INTRAVENOUS
  Administered 2017-08-06 (×2): 25 mg via INTRAVENOUS
  Administered 2017-08-06: 20 mg via INTRAVENOUS
  Administered 2017-08-06 (×2): 10 mg via INTRAVENOUS

## 2017-08-06 MED ORDER — MIDAZOLAM HCL 2 MG/2ML IJ SOLN
INTRAMUSCULAR | Status: AC
  Filled 2017-08-06: qty 2

## 2017-08-06 MED ORDER — LIDOCAINE HCL (PF) 2 % IJ SOLN
INTRAMUSCULAR | Status: DC | PRN
  Administered 2017-08-06: 4.5 mL

## 2017-08-06 MED ORDER — LIDOCAINE HCL 3.5 % OP GEL
1.0000 "application " | Freq: Once | OPHTHALMIC | Status: AC
  Administered 2017-08-06: 1 via OPHTHALMIC

## 2017-08-06 MED ORDER — LACTATED RINGERS IV SOLN
INTRAVENOUS | Status: DC
  Administered 2017-08-06: 1000 mL via INTRAVENOUS

## 2017-08-06 MED ORDER — MIDAZOLAM HCL 5 MG/5ML IJ SOLN
INTRAMUSCULAR | Status: DC | PRN
  Administered 2017-08-06 (×2): 1 mg via INTRAVENOUS

## 2017-08-06 MED ORDER — TRYPAN BLUE 0.06 % OP SOLN
OPHTHALMIC | Status: DC | PRN
  Administered 2017-08-06: .25 mL via INTRAOCULAR

## 2017-08-06 MED ORDER — TETRACAINE HCL 0.5 % OP SOLN
1.0000 [drp] | OPHTHALMIC | Status: AC
  Administered 2017-08-06 (×3): 1 [drp] via OPHTHALMIC

## 2017-08-06 MED ORDER — LIDOCAINE HCL (PF) 1 % IJ SOLN
INTRAOCULAR | Status: DC | PRN
  Administered 2017-08-06: .9 mL via OPHTHALMIC

## 2017-08-06 MED ORDER — NEOMYCIN-POLYMYXIN-DEXAMETH 3.5-10000-0.1 OP SUSP
OPHTHALMIC | Status: DC | PRN
  Administered 2017-08-06: 2 [drp] via OPHTHALMIC

## 2017-08-06 MED ORDER — BSS IO SOLN
INTRAOCULAR | Status: DC | PRN
  Administered 2017-08-06: 500 mL

## 2017-08-06 MED ORDER — FENTANYL CITRATE (PF) 100 MCG/2ML IJ SOLN
INTRAMUSCULAR | Status: AC
  Filled 2017-08-06: qty 2

## 2017-08-06 MED ORDER — SODIUM HYALURONATE 23 MG/ML IO SOLN
INTRAOCULAR | Status: DC | PRN
  Administered 2017-08-06: 0.6 mL via INTRAOCULAR

## 2017-08-06 MED ORDER — MIDAZOLAM HCL 2 MG/2ML IJ SOLN
2.0000 mg | INTRAMUSCULAR | Status: AC | PRN
  Administered 2017-08-06: 2 mg via INTRAVENOUS

## 2017-08-06 MED ORDER — POVIDONE-IODINE 5 % OP SOLN
OPHTHALMIC | Status: DC | PRN
  Administered 2017-08-06: 1 via OPHTHALMIC

## 2017-08-06 MED ORDER — PROPOFOL 10 MG/ML IV BOLUS
INTRAVENOUS | Status: AC
  Filled 2017-08-06: qty 20

## 2017-08-06 MED ORDER — PROVISC 10 MG/ML IO SOLN
INTRAOCULAR | Status: DC | PRN
  Administered 2017-08-06: 0.85 mL via INTRAOCULAR

## 2017-08-06 MED ORDER — BSS IO SOLN
INTRAOCULAR | Status: DC | PRN
  Administered 2017-08-06: 15 mL

## 2017-08-06 MED ORDER — PHENYLEPHRINE HCL 2.5 % OP SOLN
1.0000 [drp] | OPHTHALMIC | Status: AC
  Administered 2017-08-06 (×3): 1 [drp] via OPHTHALMIC

## 2017-08-06 MED ORDER — CYCLOPENTOLATE-PHENYLEPHRINE 0.2-1 % OP SOLN
1.0000 [drp] | OPHTHALMIC | Status: AC
  Administered 2017-08-06 (×3): 1 [drp] via OPHTHALMIC

## 2017-08-06 MED ORDER — BUPIVACAINE HCL (PF) 0.5 % IJ SOLN
INTRAMUSCULAR | Status: DC | PRN
  Administered 2017-08-06: 4.5 mL

## 2017-08-06 SURGICAL SUPPLY — 13 items

## 2017-08-06 NOTE — H&P (Signed)
The H and P was reviewed and updated. The patient was examined.  No changes were found after exam.  The surgical eye was marked.  

## 2017-08-06 NOTE — Op Note (Signed)
Date of procedure: 08/06/17  Pre-operative diagnosis: Visually significant cataract, Left Eye (H25.812)  Post-operative diagnosis: Visually significant cataract, Left Eye  Procedure: Removal of cataract via phacoemulsification and insertion of intra-ocular lens Johnson and Leedey  +28.0D into the capsular bag of the Left Eye  Attending surgeon: Gerda Diss. Rubina Basinski, MD, MA  Anesthesia: MAC, Retrobulbar 1:1 mix of 2% Lidocaine and 0.5% Bupivicane,Topical Akten  Complications: None  Estimated Blood Loss: <5m (minimal)  Specimens: None  Implants: As above  Indications:  Visually significant cataract, Left Eye  Procedure:  The patient was seen and identified in the pre-operative area. The operative eye was identified and dilated.  The operative eye was marked.  Topical anesthesia was administered to the operative eye.     The patient was then to the operative suite and placed in the supine position.  A timeout was performed confirming the patient, procedure to be performed, and all other relevant information.  A retrobulbar injection (4.579m was administered.  The patient's face was prepped and draped in the usual fashion for intra-ocular surgery.  A lid speculum was placed into the operative eye and the surgical microscope moved into place and focused.  An inferotemporal paracentesis was created using a 20 gauge paracentesis blade.  Vision blue was used to stain the anterior capsule.  Shugarcaine was injected into the anterior chamber.  Viscoelastic was injected into the anterior chamber.  A temporal clear-corneal main wound incision was created using a 2.25m6microkeratome.  A continuous curvilinear capsulorrhexis was initiated using an irrigating cystitome and completed using capsulorrhexis forceps.  Hydrodissection and hydrodeliniation were performed.  Viscoelastic was injected into the anterior chamber.  A phacoemulsification handpiece and a chopper as a second instrument were  used to remove the nucleus and epinucleus. The irrigation/aspiration handpiece was used to remove any remaining cortical material.   The capsular bag was reinflated with viscoelastic, checked, and found to be intact.  The intraocular lens was inserted into the capsular bag and dialed into place using a Kuglen hook.  The irrigation/aspiration handpiece was used to remove any remaining viscoelastic.  The clear corneal wound and paracentesis wounds were then hydrated and checked with Weck-Cels to be watertight.  The lid-speculum and drape was removed, and the patient's face was cleaned with a wet and dry 4x4.  Maxitrol was instilled in the eye before a clear shield was taped over the eye. The patient was taken to the post-operative care unit in good condition, having tolerated the procedure well.  Post-Op Instructions: [facility] and will receive all other orders and instructions.

## 2017-08-06 NOTE — Addendum Note (Signed)
Addendum  created 08/06/17 1326 by Kalie Cabral S, CRNA   Order list changed    

## 2017-08-06 NOTE — Addendum Note (Signed)
Addendum  created 08/06/17 1336 by Moshe Salisburyaniel, Takita Riecke E, CRNA   SmartForm saved

## 2017-08-06 NOTE — Discharge Instructions (Signed)
Please discharge patient when stable, will follow up today with Dr. Munirah Doerner at the [facility] office immediately following discharge.  Leave shield in place until visit.  All paperwork with discharge instructions will be given at the office. ° °

## 2017-08-06 NOTE — Transfer of Care (Signed)
Immediate Anesthesia Transfer of Care Note  Patient: Audrey Collier  Procedure(s) Performed: CATARACT EXTRACTION PHACO AND INTRAOCULAR LENS PLACEMENT LEFT EYE (Left Eye)  Patient Location: Short Stay  Anesthesia Type:MAC  Level of Consciousness: awake and alert   Airway & Oxygen Therapy: Patient Spontanous Breathing  Post-op Assessment: Report given to RN  Post vital signs: Reviewed  Last Vitals:  Vitals Value Taken Time  BP    Temp    Pulse    Resp    SpO2      Last Pain:  Vitals:   08/06/17 1000  TempSrc: Oral  PainSc: 0-No pain      Patients Stated Pain Goal: 3 (82/50/53 9767)  Complications: No apparent anesthesia complications

## 2017-08-06 NOTE — Anesthesia Postprocedure Evaluation (Signed)
Anesthesia Post Note  Patient: Audrey SevereSheila D Collier  Procedure(s) Performed: CATARACT EXTRACTION PHACO AND INTRAOCULAR LENS PLACEMENT LEFT EYE (Left Eye)  Anesthesia Type: General Level of consciousness: awake and alert and oriented Pain management: pain level controlled Vital Signs Assessment: post-procedure vital signs reviewed and stable Respiratory status: spontaneous breathing Cardiovascular status: blood pressure returned to baseline and stable Postop Assessment: no apparent nausea or vomiting Anesthetic complications: no     Last Vitals:  Vitals:   08/06/17 1110 08/06/17 1115  BP: 122/68   Pulse:    Resp: (!) 23 (!) 28  Temp:    SpO2: 94% 94%    Last Pain:  Vitals:   08/06/17 1000  TempSrc: Oral  PainSc: 0-No pain                 Jasun Gasparini

## 2017-08-06 NOTE — Anesthesia Preprocedure Evaluation (Addendum)
Anesthesia Evaluation  Patient identified by MRN, date of birth, ID band Patient awake    Reviewed: Allergy & Precautions, H&P , NPO status , Patient's Chart, lab work & pertinent test results, reviewed documented beta blocker date and time   Airway Mallampati: II  TM Distance: >3 FB Neck ROM: full    Dental no notable dental hx.    Pulmonary neg pulmonary ROS,    Pulmonary exam normal breath sounds clear to auscultation       Cardiovascular Exercise Tolerance: Good negative cardio ROS   Rhythm:regular Rate:Normal     Neuro/Psych PSYCHIATRIC DISORDERS Anxiety negative neurological ROS  negative psych ROS   GI/Hepatic negative GI ROS, Neg liver ROS, GERD  ,  Endo/Other  negative endocrine ROSHypothyroidism   Renal/GU negative Renal ROS  negative genitourinary   Musculoskeletal   Abdominal   Peds  Hematology negative hematology ROS (+)   Anesthesia Other Findings Mental disorder  mentally challenged   Reproductive/Obstetrics negative OB ROS                             Anesthesia Physical Anesthesia Plan  ASA: II  Anesthesia Plan: MAC   Post-op Pain Management:    Induction:   PONV Risk Score and Plan:   Airway Management Planned:   Additional Equipment:   Intra-op Plan:   Post-operative Plan:   Informed Consent: I have reviewed the patients History and Physical, chart, labs and discussed the procedure including the risks, benefits and alternatives for the proposed anesthesia with the patient or authorized representative who has indicated his/her understanding and acceptance.   Dental Advisory Given  Plan Discussed with: CRNA  Anesthesia Plan Comments:        Anesthesia Quick Evaluation

## 2017-08-09 ENCOUNTER — Encounter (HOSPITAL_COMMUNITY): Payer: Self-pay | Admitting: Ophthalmology

## 2017-09-14 ENCOUNTER — Encounter (HOSPITAL_COMMUNITY)
Admission: RE | Admit: 2017-09-14 | Discharge: 2017-09-14 | Disposition: A | Discharge status: Home / Self Care | Payer: Medicaid Other | Source: Ambulatory Visit | Attending: Ophthalmology | Admitting: Ophthalmology

## 2017-09-17 ENCOUNTER — Encounter (HOSPITAL_COMMUNITY): Admission: RE | Payer: Self-pay | Source: Ambulatory Visit

## 2017-09-17 ENCOUNTER — Ambulatory Visit (HOSPITAL_COMMUNITY): Admission: RE | Admit: 2017-09-17 | Payer: Medicaid Other | Source: Ambulatory Visit | Admitting: Ophthalmology

## 2017-09-17 SURGERY — PHACOEMULSIFICATION, CATARACT, WITH IOL INSERTION
Anesthesia: Monitor Anesthesia Care | Laterality: Right

## 2017-09-30 ENCOUNTER — Encounter (HOSPITAL_COMMUNITY): Payer: Self-pay

## 2017-09-30 ENCOUNTER — Encounter (HOSPITAL_COMMUNITY)
Admission: RE | Admit: 2017-09-30 | Discharge: 2017-09-30 | Disposition: A | Discharge status: Home / Self Care | Payer: Medicaid Other | Source: Ambulatory Visit | Attending: Ophthalmology | Admitting: Ophthalmology

## 2017-10-01 ENCOUNTER — Encounter (HOSPITAL_COMMUNITY): Payer: Self-pay

## 2017-10-01 ENCOUNTER — Ambulatory Visit (HOSPITAL_COMMUNITY)
Admission: RE | Admit: 2017-10-01 | Discharge: 2017-10-01 | Disposition: A | Discharge status: Home / Self Care | Payer: Medicaid Other | Source: Ambulatory Visit | Attending: Ophthalmology | Admitting: Ophthalmology

## 2017-10-01 ENCOUNTER — Other Ambulatory Visit: Payer: Self-pay

## 2017-10-01 ENCOUNTER — Ambulatory Visit (HOSPITAL_COMMUNITY): Payer: Medicaid Other | Admitting: Anesthesiology

## 2017-10-01 ENCOUNTER — Encounter (HOSPITAL_COMMUNITY)
Admission: RE | Disposition: A | Discharge status: Home / Self Care | Payer: Self-pay | Source: Ambulatory Visit | Attending: Ophthalmology

## 2017-10-01 DIAGNOSIS — K219 Gastro-esophageal reflux disease without esophagitis: Secondary | ICD-10-CM | POA: Diagnosis not present

## 2017-10-01 DIAGNOSIS — Z79890 Hormone replacement therapy (postmenopausal): Secondary | ICD-10-CM | POA: Diagnosis not present

## 2017-10-01 DIAGNOSIS — F419 Anxiety disorder, unspecified: Secondary | ICD-10-CM | POA: Diagnosis not present

## 2017-10-01 DIAGNOSIS — H25811 Combined forms of age-related cataract, right eye: Secondary | ICD-10-CM | POA: Insufficient documentation

## 2017-10-01 DIAGNOSIS — Z79899 Other long term (current) drug therapy: Secondary | ICD-10-CM | POA: Diagnosis not present

## 2017-10-01 DIAGNOSIS — E039 Hypothyroidism, unspecified: Secondary | ICD-10-CM | POA: Insufficient documentation

## 2017-10-01 HISTORY — PX: CATARACT EXTRACTION W/PHACO: SHX586

## 2017-10-01 SURGERY — PHACOEMULSIFICATION, CATARACT, WITH IOL INSERTION
Anesthesia: Monitor Anesthesia Care | Site: Eye | Laterality: Right

## 2017-10-01 MED ORDER — PROPOFOL 10 MG/ML IV BOLUS
INTRAVENOUS | Status: DC | PRN
  Administered 2017-10-01 (×2): 20 mg via INTRAVENOUS

## 2017-10-01 MED ORDER — SODIUM HYALURONATE 23 MG/ML IO SOLN
INTRAOCULAR | Status: DC | PRN
  Administered 2017-10-01: 0.6 mL via INTRAOCULAR

## 2017-10-01 MED ORDER — LIDOCAINE HCL (PF) 1 % IJ SOLN
INTRAOCULAR | Status: DC | PRN
  Administered 2017-10-01: 1 mL via OPHTHALMIC

## 2017-10-01 MED ORDER — LACTATED RINGERS IV SOLN
INTRAVENOUS | Status: DC
  Administered 2017-10-01: 11:00:00 via INTRAVENOUS

## 2017-10-01 MED ORDER — PROVISC 10 MG/ML IO SOLN
INTRAOCULAR | Status: DC | PRN
  Administered 2017-10-01: 0.85 mL via INTRAOCULAR

## 2017-10-01 MED ORDER — BUPIVACAINE HCL (PF) 0.5 % IJ SOLN
INTRAMUSCULAR | Status: DC | PRN
  Administered 2017-10-01: 2.5 mL

## 2017-10-01 MED ORDER — PROPOFOL 10 MG/ML IV BOLUS
INTRAVENOUS | Status: AC
  Filled 2017-10-01: qty 20

## 2017-10-01 MED ORDER — LIDOCAINE HCL (PF) 2 % IJ SOLN
INTRAMUSCULAR | Status: DC | PRN
  Administered 2017-10-01: 2.5 mL

## 2017-10-01 MED ORDER — PHENYLEPHRINE HCL 2.5 % OP SOLN
1.0000 [drp] | OPHTHALMIC | Status: AC
  Administered 2017-10-01 (×3): 1 [drp] via OPHTHALMIC

## 2017-10-01 MED ORDER — FENTANYL CITRATE (PF) 100 MCG/2ML IJ SOLN
INTRAMUSCULAR | Status: AC
  Filled 2017-10-01: qty 2

## 2017-10-01 MED ORDER — EPINEPHRINE PF 1 MG/ML IJ SOLN
INTRAOCULAR | Status: DC | PRN
  Administered 2017-10-01: 500 mL

## 2017-10-01 MED ORDER — PROPOFOL 10 MG/ML IV BOLUS: INTRAVENOUS | Status: DC | PRN

## 2017-10-01 MED ORDER — ATROPINE SULFATE 0.4 MG/ML IJ SOLN
INTRAMUSCULAR | Status: AC
  Filled 2017-10-01: qty 3

## 2017-10-01 MED ORDER — BSS IO SOLN
INTRAOCULAR | Status: DC | PRN
  Administered 2017-10-01: 15 mL

## 2017-10-01 MED ORDER — MIDAZOLAM HCL 2 MG/2ML IJ SOLN
INTRAMUSCULAR | Status: AC
  Filled 2017-10-01: qty 2

## 2017-10-01 MED ORDER — BUPIVACAINE HCL (PF) 0.5 % IJ SOLN
INTRAMUSCULAR | Status: AC
  Filled 2017-10-01: qty 30

## 2017-10-01 MED ORDER — MIDAZOLAM HCL 2 MG/2ML IJ SOLN
INTRAMUSCULAR | Status: DC | PRN
  Administered 2017-10-01 (×2): 1 mg via INTRAVENOUS

## 2017-10-01 MED ORDER — NEOMYCIN-POLYMYXIN-DEXAMETH 3.5-10000-0.1 OP SUSP
OPHTHALMIC | Status: DC | PRN
  Administered 2017-10-01: 2 [drp] via OPHTHALMIC

## 2017-10-01 MED ORDER — TETRACAINE HCL 0.5 % OP SOLN
1.0000 [drp] | OPHTHALMIC | Status: AC
  Administered 2017-10-01 (×3): 1 [drp] via OPHTHALMIC

## 2017-10-01 MED ORDER — CYCLOPENTOLATE-PHENYLEPHRINE 0.2-1 % OP SOLN
1.0000 [drp] | OPHTHALMIC | Status: AC
  Administered 2017-10-01 (×3): 1 [drp] via OPHTHALMIC

## 2017-10-01 MED ORDER — LIDOCAINE HCL (PF) 2 % IJ SOLN
INTRAMUSCULAR | Status: AC
  Filled 2017-10-01: qty 10

## 2017-10-01 MED ORDER — POVIDONE-IODINE 5 % OP SOLN
OPHTHALMIC | Status: DC | PRN
  Administered 2017-10-01: 1 via OPHTHALMIC

## 2017-10-01 MED ORDER — LIDOCAINE HCL (PF) 1 % IJ SOLN
INTRAMUSCULAR | Status: AC
  Filled 2017-10-01: qty 10

## 2017-10-01 MED ORDER — LIDOCAINE HCL 3.5 % OP GEL
1.0000 "application " | Freq: Once | OPHTHALMIC | Status: AC
  Administered 2017-10-01: 1 via OPHTHALMIC

## 2017-10-01 MED ORDER — SUCCINYLCHOLINE CHLORIDE 20 MG/ML IJ SOLN
INTRAMUSCULAR | Status: AC
  Filled 2017-10-01: qty 1

## 2017-10-01 SURGICAL SUPPLY — 13 items

## 2017-10-01 NOTE — Discharge Instructions (Addendum)
Please discharge patient when stable, will follow up today with Dr. Wrzosek at the [facility] office immediately following discharge.  Leave shield in place until visit.  All paperwork with discharge instructions will be given at the office. ° ° °Monitored Anesthesia Care, Care After °These instructions provide you with information about caring for yourself after your procedure. Your health care provider may also give you more specific instructions. Your treatment has been planned according to current medical practices, but problems sometimes occur. Call your health care provider if you have any problems or questions after your procedure. °What can I expect after the procedure? °After your procedure, it is common to: °· Feel sleepy for several hours. °· Feel clumsy and have poor balance for several hours. °· Feel forgetful about what happened after the procedure. °· Have poor judgment for several hours. °· Feel nauseous or vomit. °· Have a sore throat if you had a breathing tube during the procedure. ° °Follow these instructions at home: °For at least 24 hours after the procedure: ° °· Do not: °? Participate in activities in which you could fall or become injured. °? Drive. °? Use heavy machinery. °? Drink alcohol. °? Take sleeping pills or medicines that cause drowsiness. °? Make important decisions or sign legal documents. °? Take care of children on your own. °· Rest. °Eating and drinking °· Follow the diet that is recommended by your health care provider. °· If you vomit, drink water, juice, or soup when you can drink without vomiting. °· Make sure you have little or no nausea before eating solid foods. °General instructions °· Have a responsible adult stay with you until you are awake and alert. °· Take over-the-counter and prescription medicines only as told by your health care provider. °· If you smoke, do not smoke without supervision. °· Keep all follow-up visits as told by your health care  provider. This is important. °Contact a health care provider if: °· You keep feeling nauseous or you keep vomiting. °· You feel light-headed. °· You develop a rash. °· You have a fever. °Get help right away if: °· You have trouble breathing. °This information is not intended to replace advice given to you by your health care provider. Make sure you discuss any questions you have with your health care provider. °Document Released: 06/09/2015 Document Revised: 10/09/2015 Document Reviewed: 06/09/2015 °Elsevier Interactive Patient Education © 2018 [employer] ° °

## 2017-10-01 NOTE — Anesthesia Postprocedure Evaluation (Signed)
Anesthesia Post Note  Patient: Audrey Collier  Procedure(s) Performed: CATARACT EXTRACTION PHACO AND INTRAOCULAR LENS PLACEMENT ([facility]) (Right Eye)  Patient location during evaluation: Short Stay Anesthesia Type: MAC Level of consciousness: awake and alert and patient cooperative Pain management: satisfactory to patient Vital Signs Assessment: post-procedure vital signs reviewed and stable Respiratory status: spontaneous breathing Cardiovascular status: stable Postop Assessment: no apparent nausea or vomiting Anesthetic complications: no     Last Vitals:  Vitals:   10/01/17 1100 10/01/17 1214  BP: 122/69 119/75  Pulse:  72  Resp: (!) 29 18  Temp:  36.8 C  SpO2: 91% 95%    Last Pain:  Vitals:   10/01/17 1214  TempSrc: Oral  PainSc: 0-No pain                 Kimble Hitchens

## 2017-10-01 NOTE — Transfer of Care (Signed)
Immediate Anesthesia Transfer of Care Note  Patient: Audrey Collier  Procedure(s) Performed: CATARACT EXTRACTION PHACO AND INTRAOCULAR LENS PLACEMENT ([facility]) (Right Eye)  Patient Location: PACU  Anesthesia Type:MAC  Level of Consciousness: awake and patient cooperative  Airway & Oxygen Therapy: Patient Spontanous Breathing  Post-op Assessment: Report given to RN and Post -op Vital signs reviewed and stable  Post vital signs: Reviewed and stable  Last Vitals:  Vitals Value Taken Time  BP 119/75 10/01/2017 12:14 PM  Temp 36.8 C 10/01/2017 12:14 PM  Pulse 72 10/01/2017 12:14 PM  Resp 18 10/01/2017 12:14 PM  SpO2 95 % 10/01/2017 12:14 PM    Last Pain:  Vitals:   10/01/17 1214  TempSrc: Oral  PainSc: 0-No pain      Patients Stated Pain Goal: 5 (12/75/17 0017)  Complications: No apparent anesthesia complications

## 2017-10-01 NOTE — Anesthesia Preprocedure Evaluation (Signed)
Anesthesia Evaluation  Patient identified by MRN, date of birth, ID band Patient awake    Reviewed: Allergy & Precautions, H&P , NPO status , Patient's Chart, lab work & pertinent test results, reviewed documented beta blocker date and time   Airway Mallampati: II  TM Distance: >3 FB Neck ROM: full    Dental no notable dental hx. (+) Edentulous Upper, Edentulous Lower   Pulmonary neg pulmonary ROS,    Pulmonary exam normal breath sounds clear to auscultation       Cardiovascular Exercise Tolerance: Good negative cardio ROS   Rhythm:regular Rate:Normal     Neuro/Psych PSYCHIATRIC DISORDERS Anxiety negative neurological ROS  negative psych ROS   GI/Hepatic negative GI ROS, Neg liver ROS, GERD  ,  Endo/Other  negative endocrine ROSHypothyroidism   Renal/GU negative Renal ROS  negative genitourinary   Musculoskeletal   Abdominal   Peds  Hematology negative hematology ROS (+)   Anesthesia Other Findings Phaco/IOL left eye no anesthesia issues. No interval changes to health  Reproductive/Obstetrics negative OB ROS                             Anesthesia Physical Anesthesia Plan  ASA: II  Anesthesia Plan: MAC   Post-op Pain Management:    Induction:   PONV Risk Score and Plan:   Airway Management Planned:   Additional Equipment:   Intra-op Plan:   Post-operative Plan:   Informed Consent: I have reviewed the patients History and Physical, chart, labs and discussed the procedure including the risks, benefits and alternatives for the proposed anesthesia with the patient or authorized representative who has indicated his/her understanding and acceptance.   Dental Advisory Given  Plan Discussed with: CRNA and Anesthesiologist  Anesthesia Plan Comments:         Anesthesia Quick Evaluation

## 2017-10-01 NOTE — Anesthesia Postprocedure Evaluation (Signed)
Anesthesia Post Note  Patient: Audrey Collier  Procedure(s) Performed: CATARACT EXTRACTION PHACO AND INTRAOCULAR LENS PLACEMENT ([employer]) (Right Eye)  Patient location during evaluation: PACU Anesthesia Type: MAC Level of consciousness: awake Pain management: pain level controlled Vital Signs Assessment: post-procedure vital signs reviewed and stable Respiratory status: spontaneous breathing Cardiovascular status: stable Postop Assessment: no apparent nausea or vomiting Anesthetic complications: no     Last Vitals:  Vitals:   10/01/17 1018 10/01/17 1214  BP: 124/74 119/75  Pulse: 75 72  Resp: 18 18  Temp: 36.8 C 36.8 C  SpO2: 94% 95%    Last Pain:  Vitals:   10/01/17 1214  TempSrc: Oral  PainSc: 0-No pain                 Everette Rank

## 2017-10-01 NOTE — H&P (Signed)
The H and P was reviewed and updated. The patient was examined.  No changes were found after exam.  The surgical eye was marked.  

## 2017-10-01 NOTE — Op Note (Signed)
Date of procedure: 10/01/17  Pre-operative diagnosis: Visually significant cataract, Right Eye (H25.811)  Post-operative diagnosis: Visually significant cataract, Right Eye  Procedure: Removal of cataract via phacoemulsification and insertion of intra-ocular lens [employer] PCB00  +27.5D into the capsular bag of the Right Eye  Attending surgeon: Gerda Diss. Ubah Radke, MD, MA  Anesthesia: 1) MAC 2)Topical Akten 3) Retrobulbar Block  Complications: None  Estimated Blood Loss: <60m (minimal)  Specimens: None  Implants: As above  Indications:  Visually significant cataract, Right Eye  Procedure:  The patient was seen and identified in the pre-operative area. The operative eye was identified and dilated.  The operative eye was marked.  Topical anesthesia was administered to the operative eye.     The patient was then to the operative suite and placed in the supine position.  A timeout was performed confirming the patient, procedure to be performed, and all other relevant information.   A retrobulbar block of 550mof a 1:1 mixture of 0.50% Bupivacaine and 2% Lidocaine was administered after adequate sedation was achieved.  The patient's face was prepped and draped in the usual fashion for intra-ocular surgery.  A lid speculum was placed into the operative eye and the surgical microscope moved into place and focused.  A superotemporal paracentesis was created using a 20 gauge paracentesis blade.  Shugarcaine was injected into the anterior chamber.  Viscoelastic was injected into the anterior chamber.  A temporal clear-corneal main wound incision was created using a 2.30m630microkeratome.  A continuous curvilinear capsulorrhexis was initiated using an irrigating cystitome and completed using capsulorrhexis forceps.  Hydrodissection and hydrodeliniation were performed.  Viscoelastic was injected into the anterior chamber.  A phacoemulsification handpiece and a chopper as a second instrument  were used to remove the nucleus and epinucleus. The irrigation/aspiration handpiece was used to remove any remaining cortical material.   The capsular bag was reinflated with viscoelastic, checked, and found to be intact.  The intraocular lens was inserted into the capsular bag and dialed into place using a Kuglen hook.  The irrigation/aspiration handpiece was used to remove any remaining viscoelastic.  The clear corneal wound and paracentesis wounds were then hydrated and checked with Weck-Cels to be watertight.  The lid-speculum and drape was removed, and the patient's face was cleaned with a wet and dry 4x4.  Maxitrol was instilled in the eye before a clear shield was taped over the eye. The patient was taken to the post-operative care unit in good condition, having tolerated the procedure well.  Post-Op Instructions: [facility] and will receive all other orders and instructions.

## 2017-10-04 ENCOUNTER — Encounter (HOSPITAL_COMMUNITY): Payer: Self-pay | Admitting: Ophthalmology

## 2018-02-08 ENCOUNTER — Encounter: Payer: Self-pay | Admitting: Gastroenterology

## 2018-04-05 ENCOUNTER — Telehealth: Payer: Self-pay

## 2018-04-05 NOTE — Telephone Encounter (Signed)
Pts caregiver called to schedule pts followup. They received a letter. Please call them back at [phone]

## 2018-04-06 ENCOUNTER — Encounter: Payer: Self-pay | Admitting: Gastroenterology

## 2018-04-06 NOTE — Telephone Encounter (Signed)
PHONE NUMBER WOULD NOT WORK, RANG ONCE.  SCHEDULED AND MAILED LETTER

## 2018-06-15 ENCOUNTER — Ambulatory Visit: Payer: Medicaid Other | Admitting: Gastroenterology

## 2018-06-17 ENCOUNTER — Encounter: Payer: Self-pay | Admitting: Gastroenterology

## 2018-06-17 ENCOUNTER — Other Ambulatory Visit: Payer: Self-pay

## 2018-06-17 ENCOUNTER — Ambulatory Visit (INDEPENDENT_AMBULATORY_CARE_PROVIDER_SITE_OTHER): Payer: Medicaid Other | Admitting: Gastroenterology

## 2018-06-17 DIAGNOSIS — K5900 Constipation, unspecified: Secondary | ICD-10-CM

## 2018-06-17 DIAGNOSIS — K219 Gastro-esophageal reflux disease without esophagitis: Secondary | ICD-10-CM

## 2018-06-17 NOTE — Progress Notes (Signed)
Primary Care Physician:  Pearson GrippeKim, James, MD  Primary GI: Dr. Darrick PennaFields   Virtual Visit via Telephone Note Due to COVID-19, visit is conducted virtually and was requested by patient.   I connected with Audrey Collier on 06/17/18 at 10:00 AM EDT by telephone and verified that I am speaking with the correct person using two identifiers.Caretaker, Leta Baptisthonda Oshita, is present with patient.    I discussed the limitations, risks, security and privacy concerns of performing an evaluation and management service by telephone and the availability of in person appointments. I also discussed with the patient that there may be a patient responsible charge related to this service. The patient expressed understanding and agreed to proceed.  Chief Complaint  Patient presents with  . Gastroesophageal Reflux    f/u. no issues  . Constipation    Takes stool softner QOD, states bowels are fine, has BM daily     History of Present Illness: Very pleasant 58 year old female with history of constipation, now for follow-up. Remote history of H.pylori s/p treatment with documented eradication.   Sometimes belly hurts in the mornings. Prilosec daily. No GERD. No dysphagia. Taking stool softener every other day. No rectal bleeding. Appetite good. No N/V. Sometimes when standing up to walk, gets dizzy. Caretaker providing majority of information. Patient denies any complaints today. Recently had a birthday. Enjoyed her chocolate cake.   Past Medical History:  Diagnosis Date  . Anxiety   . Dizziness   . Endometrial polyp 03/26/2014  . Fecal occult blood test positive 03/15/2013   will send 3 cards home and refer to GI for colonoscopy  . GERD (gastroesophageal reflux disease)   . Hypothyroidism   . Mental disorder    mentally challenged  . PMB (postmenopausal bleeding) 03/26/2014  . Thickened endometrium 03/26/2014   ?polyp will get HSG     Past Surgical History:  Procedure Laterality Date  . CATARACT  EXTRACTION W/PHACO Left 08/06/2017   Procedure: CATARACT EXTRACTION PHACO AND INTRAOCULAR LENS PLACEMENT LEFT EYE;  Surgeon: Fabio PierceWrzosek, James, MD;  Location: [facility];  Service: Ophthalmology;  Laterality: Left;  CDE: 12.63  . CATARACT EXTRACTION W/PHACO Right 10/01/2017   Procedure: CATARACT EXTRACTION PHACO AND INTRAOCULAR LENS PLACEMENT (IOC);  Surgeon: Fabio PierceWrzosek, James, MD;  Location: [facility];  Service: Ophthalmology;  Laterality: Right;  CDE: 5.77  . COLONOSCOPY N/A 12/01/2013   Dr. Fields:moderate sized internal hemorrhoids/left colon is redundant  . DILITATION & CURRETTAGE/HYSTROSCOPY WITH NOVASURE ABLATION N/A 04/18/2014   Procedure: DILATATION & CURETTAGE/HYSTEROSCOPY WITH NOVASURE ENDOMETRIAL ABLATION;  Surgeon: Lazaro ArmsLuther H Eure, MD;  Location: [facility];  Service: Gynecology;  Laterality: N/A;  Uterine Cavity Length 5cm     . ESOPHAGOGASTRODUODENOSCOPY N/A 12/01/2013   ZOX:WRUESLF:mild non erosive gastritis. +H.pylori   . NO PAST SURGERIES    . POLYPECTOMY N/A 04/18/2014   Procedure: ENDOMETRIAL POLYPECTOMY;  Surgeon: Lazaro ArmsLuther H Eure, MD;  Location: [facility];  Service: Gynecology;  Laterality: N/A;     Current Meds  Medication Sig  . acetaminophen (TYLENOL) 500 MG tablet Take 1,000 mg by mouth every 6 (six) hours as needed for moderate pain.  . Calcium Citrate-Vitamin D (CALCIUM + D PO) Take 1 tablet by mouth daily.  . cetirizine (ZYRTEC) 10 MG tablet Take 10 mg by mouth daily.  . diclofenac sodium (VOLTAREN) 1 % GEL Apply 1 application topically every 6 (six) hours as needed (heel pain).  . Diclofenac Sodium 3 % CREA Apply 2-3 g topically 4 (four) times daily as  needed (elbow/shoulder pain).  Tery Sanfilippo Calcium (STOOL SOFTENER PO) Take 1 tablet by mouth daily as needed (constipation).  [name] FLUoxetine (PROZAC) 10 MG tablet Take 10 mg by mouth daily.  . fluticasone (FLONASE) 50 MCG/ACT nasal spray Place 1 spray into both nostrils 2 (two) times daily.  [name] levothyroxine (SYNTHROID, LEVOTHROID) 88 MCG tablet Take 100  mcg by mouth daily before breakfast.   . Multiple Vitamin (MULTIVITAMIN) tablet Take 1 tablet by mouth daily.  [name] omeprazole (PRILOSEC) 20 MG capsule TAKE 1 CAPSULE BY MOUTH ONCE DAILY.  . rosuvastatin (CRESTOR) 5 MG tablet Take 5 mg by mouth daily.       Observations/Objective: No distress. Unable to perform physical exam due to telephone encounter. No video available.   Assessment and Plan: Pleasant 58 year old female with telephone call completed, assisted by caretaker/family member [name]. She is doing well and much improved from a constipation standpoint. Historically, we have had difficulty managing, but she is now taking a stool softener every other day with good results. GERD well-controlled with once daily PPI. Vague abdominal pain in the mornings, which has no precipitating or alleviating factors; however, this improves throughout the day. Patient is a limited historian, so we will monitor this for now. No alarm signs/symptoms. Return in 6 months for face-to-face encounter.   Follow Up Instructions:    I discussed the assessment and treatment plan with the patient. The patient was provided an opportunity to ask questions and all were answered. The patient agreed with the plan and demonstrated an understanding of the instructions.   The patient was advised to call back or seek an in-person evaluation if the symptoms worsen or if the condition fails to improve as anticipated.  I provided 15 minutes of non-face-to-face time during this encounter.  Gelene Mink, PhD, ANP-BC [facility]

## 2018-06-17 NOTE — Progress Notes (Signed)
CC'ED TO PCP 

## 2018-06-17 NOTE — Patient Instructions (Addendum)
Continue Prilosec daily, 30 minutes before breakfast.  Continue the stool softener daily to every other day.  We will see you in 6 months!  Please call if any worsening abdominal pain, any loss of appetite, weight loss.  I enjoyed talking with you again today! As you know, I value our relationship and want to provide genuine, compassionate, and quality care. I welcome your feedback. If you receive a survey regarding your visit,  I greatly appreciate you taking time to fill this out. See you next time!  Gelene Mink, PhD, ANP-BC [facility]

## 2018-08-11 ENCOUNTER — Other Ambulatory Visit (HOSPITAL_COMMUNITY): Payer: Self-pay | Admitting: Adult Health

## 2018-08-11 DIAGNOSIS — Z1231 Encounter for screening mammogram for malignant neoplasm of breast: Secondary | ICD-10-CM

## 2018-09-01 ENCOUNTER — Other Ambulatory Visit: Payer: Self-pay

## 2018-09-01 ENCOUNTER — Ambulatory Visit (HOSPITAL_COMMUNITY)
Admission: RE | Admit: 2018-09-01 | Discharge: 2018-09-01 | Disposition: A | Discharge status: Home / Self Care | Payer: Medicaid Other | Source: Ambulatory Visit | Attending: Adult Health | Admitting: Adult Health

## 2018-09-01 DIAGNOSIS — Z1231 Encounter for screening mammogram for malignant neoplasm of breast: Secondary | ICD-10-CM | POA: Insufficient documentation

## 2018-10-01 ENCOUNTER — Other Ambulatory Visit: Payer: Self-pay

## 2018-10-01 ENCOUNTER — Inpatient Hospital Stay (HOSPITAL_COMMUNITY)
Admission: EM | Admit: 2018-10-01 | Discharge: 2018-12-06 | DRG: 853 | Disposition: A | Discharge status: Skilled Nursing Facility | Payer: Medicaid Other | Attending: Internal Medicine | Admitting: Internal Medicine

## 2018-10-01 ENCOUNTER — Encounter (HOSPITAL_COMMUNITY): Payer: Self-pay | Admitting: Emergency Medicine

## 2018-10-01 ENCOUNTER — Emergency Department (HOSPITAL_COMMUNITY): Payer: Medicaid Other

## 2018-10-01 DIAGNOSIS — R739 Hyperglycemia, unspecified: Secondary | ICD-10-CM | POA: Diagnosis present

## 2018-10-01 DIAGNOSIS — J189 Pneumonia, unspecified organism: Secondary | ICD-10-CM

## 2018-10-01 DIAGNOSIS — Z833 Family history of diabetes mellitus: Secondary | ICD-10-CM

## 2018-10-01 DIAGNOSIS — E785 Hyperlipidemia, unspecified: Secondary | ICD-10-CM | POA: Diagnosis present

## 2018-10-01 DIAGNOSIS — I339 Acute and subacute endocarditis, unspecified: Secondary | ICD-10-CM | POA: Diagnosis not present

## 2018-10-01 DIAGNOSIS — A419 Sepsis, unspecified organism: Secondary | ICD-10-CM

## 2018-10-01 DIAGNOSIS — Z79890 Hormone replacement therapy (postmenopausal): Secondary | ICD-10-CM

## 2018-10-01 DIAGNOSIS — R627 Adult failure to thrive: Secondary | ICD-10-CM | POA: Diagnosis not present

## 2018-10-01 DIAGNOSIS — K3189 Other diseases of stomach and duodenum: Secondary | ICD-10-CM | POA: Diagnosis not present

## 2018-10-01 DIAGNOSIS — Z7189 Other specified counseling: Secondary | ICD-10-CM | POA: Diagnosis not present

## 2018-10-01 DIAGNOSIS — R8281 Pyuria: Secondary | ICD-10-CM | POA: Diagnosis present

## 2018-10-01 DIAGNOSIS — N179 Acute kidney failure, unspecified: Secondary | ICD-10-CM | POA: Diagnosis present

## 2018-10-01 DIAGNOSIS — E46 Unspecified protein-calorie malnutrition: Secondary | ICD-10-CM | POA: Diagnosis not present

## 2018-10-01 DIAGNOSIS — K449 Diaphragmatic hernia without obstruction or gangrene: Secondary | ICD-10-CM | POA: Diagnosis present

## 2018-10-01 DIAGNOSIS — Z6839 Body mass index (BMI) 39.0-39.9, adult: Secondary | ICD-10-CM | POA: Diagnosis not present

## 2018-10-01 DIAGNOSIS — K319 Disease of stomach and duodenum, unspecified: Secondary | ICD-10-CM | POA: Diagnosis not present

## 2018-10-01 DIAGNOSIS — J9601 Acute respiratory failure with hypoxia: Secondary | ICD-10-CM | POA: Diagnosis present

## 2018-10-01 DIAGNOSIS — R1912 Hyperactive bowel sounds: Secondary | ICD-10-CM | POA: Diagnosis not present

## 2018-10-01 DIAGNOSIS — D62 Acute posthemorrhagic anemia: Secondary | ICD-10-CM | POA: Diagnosis not present

## 2018-10-01 DIAGNOSIS — Z419 Encounter for procedure for purposes other than remedying health state, unspecified: Secondary | ICD-10-CM

## 2018-10-01 DIAGNOSIS — R110 Nausea: Secondary | ICD-10-CM

## 2018-10-01 DIAGNOSIS — L89810 Pressure ulcer of head, unstageable: Secondary | ICD-10-CM | POA: Diagnosis not present

## 2018-10-01 DIAGNOSIS — L8990 Pressure ulcer of unspecified site, unspecified stage: Secondary | ICD-10-CM | POA: Insufficient documentation

## 2018-10-01 DIAGNOSIS — D6959 Other secondary thrombocytopenia: Secondary | ICD-10-CM | POA: Diagnosis not present

## 2018-10-01 DIAGNOSIS — I358 Other nonrheumatic aortic valve disorders: Secondary | ICD-10-CM | POA: Diagnosis not present

## 2018-10-01 DIAGNOSIS — F419 Anxiety disorder, unspecified: Secondary | ICD-10-CM | POA: Diagnosis not present

## 2018-10-01 DIAGNOSIS — Z515 Encounter for palliative care: Secondary | ICD-10-CM | POA: Diagnosis not present

## 2018-10-01 DIAGNOSIS — E860 Dehydration: Secondary | ICD-10-CM | POA: Diagnosis present

## 2018-10-01 DIAGNOSIS — R6520 Severe sepsis without septic shock: Secondary | ICD-10-CM | POA: Diagnosis not present

## 2018-10-01 DIAGNOSIS — I351 Nonrheumatic aortic (valve) insufficiency: Secondary | ICD-10-CM | POA: Diagnosis not present

## 2018-10-01 DIAGNOSIS — R531 Weakness: Secondary | ICD-10-CM

## 2018-10-01 DIAGNOSIS — I38 Endocarditis, valve unspecified: Secondary | ICD-10-CM

## 2018-10-01 DIAGNOSIS — Z79899 Other long term (current) drug therapy: Secondary | ICD-10-CM

## 2018-10-01 DIAGNOSIS — Z20828 Contact with and (suspected) exposure to other viral communicable diseases: Secondary | ICD-10-CM | POA: Diagnosis present

## 2018-10-01 DIAGNOSIS — R509 Fever, unspecified: Secondary | ICD-10-CM

## 2018-10-01 DIAGNOSIS — R21 Rash and other nonspecific skin eruption: Secondary | ICD-10-CM | POA: Diagnosis present

## 2018-10-01 DIAGNOSIS — Z09 Encounter for follow-up examination after completed treatment for conditions other than malignant neoplasm: Secondary | ICD-10-CM

## 2018-10-01 DIAGNOSIS — R7989 Other specified abnormal findings of blood chemistry: Secondary | ICD-10-CM | POA: Diagnosis not present

## 2018-10-01 DIAGNOSIS — Z01810 Encounter for preprocedural cardiovascular examination: Secondary | ICD-10-CM | POA: Diagnosis not present

## 2018-10-01 DIAGNOSIS — T17908A Unspecified foreign body in respiratory tract, part unspecified causing other injury, initial encounter: Secondary | ICD-10-CM

## 2018-10-01 DIAGNOSIS — R109 Unspecified abdominal pain: Secondary | ICD-10-CM | POA: Diagnosis not present

## 2018-10-01 DIAGNOSIS — Z23 Encounter for immunization: Secondary | ICD-10-CM

## 2018-10-01 DIAGNOSIS — A4101 Sepsis due to Methicillin susceptible Staphylococcus aureus: Principal | ICD-10-CM | POA: Diagnosis present

## 2018-10-01 DIAGNOSIS — R6250 Unspecified lack of expected normal physiological development in childhood: Secondary | ICD-10-CM | POA: Diagnosis not present

## 2018-10-01 DIAGNOSIS — Z8249 Family history of ischemic heart disease and other diseases of the circulatory system: Secondary | ICD-10-CM

## 2018-10-01 DIAGNOSIS — E876 Hypokalemia: Secondary | ICD-10-CM | POA: Diagnosis not present

## 2018-10-01 DIAGNOSIS — R1110 Vomiting, unspecified: Secondary | ICD-10-CM | POA: Diagnosis not present

## 2018-10-01 DIAGNOSIS — E8809 Other disorders of plasma-protein metabolism, not elsewhere classified: Secondary | ICD-10-CM | POA: Diagnosis not present

## 2018-10-01 DIAGNOSIS — Z9889 Other specified postprocedural states: Secondary | ICD-10-CM | POA: Diagnosis not present

## 2018-10-01 DIAGNOSIS — E870 Hyperosmolality and hypernatremia: Secondary | ICD-10-CM | POA: Diagnosis not present

## 2018-10-01 DIAGNOSIS — J15211 Pneumonia due to Methicillin susceptible Staphylococcus aureus: Secondary | ICD-10-CM | POA: Diagnosis present

## 2018-10-01 DIAGNOSIS — R6521 Severe sepsis with septic shock: Secondary | ICD-10-CM | POA: Diagnosis present

## 2018-10-01 DIAGNOSIS — R601 Generalized edema: Secondary | ICD-10-CM | POA: Diagnosis not present

## 2018-10-01 DIAGNOSIS — R339 Retention of urine, unspecified: Secondary | ICD-10-CM | POA: Diagnosis not present

## 2018-10-01 DIAGNOSIS — I330 Acute and subacute infective endocarditis: Secondary | ICD-10-CM | POA: Diagnosis present

## 2018-10-01 DIAGNOSIS — R9389 Abnormal findings on diagnostic imaging of other specified body structures: Secondary | ICD-10-CM

## 2018-10-01 DIAGNOSIS — Z952 Presence of prosthetic heart valve: Secondary | ICD-10-CM | POA: Diagnosis not present

## 2018-10-01 DIAGNOSIS — N2889 Other specified disorders of kidney and ureter: Secondary | ICD-10-CM | POA: Diagnosis not present

## 2018-10-01 DIAGNOSIS — R945 Abnormal results of liver function studies: Secondary | ICD-10-CM | POA: Diagnosis not present

## 2018-10-01 DIAGNOSIS — K8020 Calculus of gallbladder without cholecystitis without obstruction: Secondary | ICD-10-CM | POA: Diagnosis present

## 2018-10-01 DIAGNOSIS — E875 Hyperkalemia: Secondary | ICD-10-CM | POA: Diagnosis not present

## 2018-10-01 DIAGNOSIS — Z7951 Long term (current) use of inhaled steroids: Secondary | ICD-10-CM

## 2018-10-01 DIAGNOSIS — E43 Unspecified severe protein-calorie malnutrition: Secondary | ICD-10-CM | POA: Diagnosis present

## 2018-10-01 DIAGNOSIS — Z9911 Dependence on respirator [ventilator] status: Secondary | ICD-10-CM | POA: Diagnosis not present

## 2018-10-01 DIAGNOSIS — J181 Lobar pneumonia, unspecified organism: Secondary | ICD-10-CM | POA: Diagnosis not present

## 2018-10-01 DIAGNOSIS — I350 Nonrheumatic aortic (valve) stenosis: Secondary | ICD-10-CM | POA: Diagnosis not present

## 2018-10-01 DIAGNOSIS — R4182 Altered mental status, unspecified: Secondary | ICD-10-CM | POA: Diagnosis not present

## 2018-10-01 DIAGNOSIS — Z66 Do not resuscitate: Secondary | ICD-10-CM | POA: Diagnosis not present

## 2018-10-01 DIAGNOSIS — N189 Chronic kidney disease, unspecified: Secondary | ICD-10-CM | POA: Diagnosis not present

## 2018-10-01 DIAGNOSIS — R011 Cardiac murmur, unspecified: Secondary | ICD-10-CM | POA: Diagnosis not present

## 2018-10-01 DIAGNOSIS — R932 Abnormal findings on diagnostic imaging of liver and biliary tract: Secondary | ICD-10-CM | POA: Diagnosis not present

## 2018-10-01 DIAGNOSIS — R140 Abdominal distension (gaseous): Secondary | ICD-10-CM | POA: Diagnosis not present

## 2018-10-01 DIAGNOSIS — K219 Gastro-esophageal reflux disease without esophagitis: Secondary | ICD-10-CM | POA: Diagnosis present

## 2018-10-01 DIAGNOSIS — R1310 Dysphagia, unspecified: Secondary | ICD-10-CM | POA: Diagnosis not present

## 2018-10-01 DIAGNOSIS — Z4659 Encounter for fitting and adjustment of other gastrointestinal appliance and device: Secondary | ICD-10-CM

## 2018-10-01 DIAGNOSIS — B9561 Methicillin susceptible Staphylococcus aureus infection as the cause of diseases classified elsewhere: Secondary | ICD-10-CM | POA: Diagnosis not present

## 2018-10-01 DIAGNOSIS — R419 Unspecified symptoms and signs involving cognitive functions and awareness: Secondary | ICD-10-CM | POA: Diagnosis not present

## 2018-10-01 DIAGNOSIS — F329 Major depressive disorder, single episode, unspecified: Secondary | ICD-10-CM | POA: Diagnosis present

## 2018-10-01 DIAGNOSIS — N19 Unspecified kidney failure: Secondary | ICD-10-CM

## 2018-10-01 DIAGNOSIS — J90 Pleural effusion, not elsewhere classified: Secondary | ICD-10-CM

## 2018-10-01 DIAGNOSIS — K581 Irritable bowel syndrome with constipation: Secondary | ICD-10-CM | POA: Diagnosis present

## 2018-10-01 DIAGNOSIS — S40829A Blister (nonthermal) of unspecified upper arm, initial encounter: Secondary | ICD-10-CM | POA: Diagnosis not present

## 2018-10-01 DIAGNOSIS — K719 Toxic liver disease, unspecified: Secondary | ICD-10-CM | POA: Diagnosis not present

## 2018-10-01 DIAGNOSIS — R630 Anorexia: Secondary | ICD-10-CM | POA: Diagnosis not present

## 2018-10-01 DIAGNOSIS — J9690 Respiratory failure, unspecified, unspecified whether with hypoxia or hypercapnia: Secondary | ICD-10-CM

## 2018-10-01 DIAGNOSIS — E039 Hypothyroidism, unspecified: Secondary | ICD-10-CM | POA: Diagnosis present

## 2018-10-01 DIAGNOSIS — Z0189 Encounter for other specified special examinations: Secondary | ICD-10-CM

## 2018-10-01 DIAGNOSIS — N059 Unspecified nephritic syndrome with unspecified morphologic changes: Secondary | ICD-10-CM | POA: Diagnosis not present

## 2018-10-01 DIAGNOSIS — Z978 Presence of other specified devices: Secondary | ICD-10-CM

## 2018-10-01 DIAGNOSIS — K828 Other specified diseases of gallbladder: Secondary | ICD-10-CM | POA: Diagnosis present

## 2018-10-01 DIAGNOSIS — F5000 Anorexia nervosa, unspecified: Secondary | ICD-10-CM | POA: Diagnosis not present

## 2018-10-01 DIAGNOSIS — D6489 Other specified anemias: Secondary | ICD-10-CM | POA: Diagnosis not present

## 2018-10-01 DIAGNOSIS — D61818 Other pancytopenia: Secondary | ICD-10-CM | POA: Diagnosis present

## 2018-10-01 DIAGNOSIS — R7881 Bacteremia: Secondary | ICD-10-CM | POA: Diagnosis not present

## 2018-10-01 DIAGNOSIS — X58XXXA Exposure to other specified factors, initial encounter: Secondary | ICD-10-CM | POA: Diagnosis not present

## 2018-10-01 DIAGNOSIS — S30821A Blister (nonthermal) of abdominal wall, initial encounter: Secondary | ICD-10-CM | POA: Diagnosis not present

## 2018-10-01 LAB — URINALYSIS, ROUTINE W REFLEX MICROSCOPIC
Bilirubin Urine: NEGATIVE
Glucose, UA: NEGATIVE mg/dL
Ketones, ur: NEGATIVE mg/dL
Leukocytes,Ua: NEGATIVE
Nitrite: NEGATIVE
Protein, ur: 100 mg/dL — AB
Specific Gravity, Urine: 1.019 (ref 1.005–1.030)
pH: 5 (ref 5.0–8.0)

## 2018-10-01 LAB — LACTIC ACID, PLASMA
Lactic Acid, Venous: 2.5 mmol/L (ref 0.5–1.9)
Lactic Acid, Venous: 1.7 mmol/L (ref 0.5–1.9)

## 2018-10-01 LAB — CBC WITH DIFFERENTIAL/PLATELET
Abs Immature Granulocytes: 0.09 10*3/uL — ABNORMAL HIGH (ref 0.00–0.07)
Basophils Absolute: 0 10*3/uL (ref 0.0–0.1)
Basophils Relative: 0 %
Eosinophils Absolute: 0 10*3/uL (ref 0.0–0.5)
Eosinophils Relative: 0 %
HCT: 41 % (ref 36.0–46.0)
Hemoglobin: 12.9 g/dL (ref 12.0–15.0)
Immature Granulocytes: 1 %
Lymphocytes Relative: 4 %
Lymphs Abs: 0.7 10*3/uL (ref 0.7–4.0)
MCH: 27.7 pg (ref 26.0–34.0)
MCHC: 31.5 g/dL (ref 30.0–36.0)
MCV: 88 fL (ref 80.0–100.0)
Monocytes Absolute: 0.3 10*3/uL (ref 0.1–1.0)
Monocytes Relative: 2 %
Neutro Abs: 15.1 10*3/uL — ABNORMAL HIGH (ref 1.7–7.7)
Neutrophils Relative %: 93 %
Platelets: 68 10*3/uL — ABNORMAL LOW (ref 150–400)
RBC: 4.66 MIL/uL (ref 3.87–5.11)
RDW: 13.8 % (ref 11.5–15.5)
WBC: 16.3 10*3/uL — ABNORMAL HIGH (ref 4.0–10.5)
nRBC: 0 % (ref 0.0–0.2)

## 2018-10-01 LAB — COMPREHENSIVE METABOLIC PANEL
ALT: 37 U/L (ref 0–44)
AST: 58 U/L — ABNORMAL HIGH (ref 15–41)
Albumin: 3.5 g/dL (ref 3.5–5.0)
Alkaline Phosphatase: 102 U/L (ref 38–126)
Anion gap: 11 (ref 5–15)
BUN: 24 mg/dL — ABNORMAL HIGH (ref 6–20)
CO2: 25 mmol/L (ref 22–32)
Calcium: 8.7 mg/dL — ABNORMAL LOW (ref 8.9–10.3)
Chloride: 104 mmol/L (ref 98–111)
Creatinine, Ser: 1.52 mg/dL — ABNORMAL HIGH (ref 0.44–1.00)
GFR calc Af Amer: 44 mL/min — ABNORMAL LOW (ref >60)
GFR calc non Af Amer: 38 mL/min — ABNORMAL LOW (ref >60)
Glucose, Bld: 138 mg/dL — ABNORMAL HIGH (ref 70–99)
Potassium: 3.5 mmol/L (ref 3.5–5.1)
Sodium: 140 mmol/L (ref 135–145)
Total Bilirubin: 1.7 mg/dL — ABNORMAL HIGH (ref 0.3–1.2)
Total Protein: 6.9 g/dL (ref 6.5–8.1)

## 2018-10-01 LAB — FIBRINOGEN: Fibrinogen: 771 mg/dL — ABNORMAL HIGH (ref 210–475)

## 2018-10-01 LAB — APTT: aPTT: 55 seconds — ABNORMAL HIGH (ref 24–36)

## 2018-10-01 LAB — SARS CORONAVIRUS 2 BY RT PCR (HOSPITAL ORDER, PERFORMED IN ~~LOC~~ HOSPITAL LAB): SARS Coronavirus 2: NEGATIVE

## 2018-10-01 LAB — LACTATE DEHYDROGENASE: LDH: 471 U/L — ABNORMAL HIGH (ref 98–192)

## 2018-10-01 LAB — PREGNANCY, URINE: Preg Test, Ur: NEGATIVE

## 2018-10-01 LAB — PROTIME-INR
INR: 1.4 — ABNORMAL HIGH (ref 0.8–1.2)
Prothrombin Time: 17.3 seconds — ABNORMAL HIGH (ref 11.4–15.2)

## 2018-10-01 LAB — PROCALCITONIN: Procalcitonin: 30.36 ng/mL

## 2018-10-01 LAB — C-REACTIVE PROTEIN: CRP: 35.6 mg/dL — ABNORMAL HIGH (ref <1.0)

## 2018-10-01 LAB — TRIGLYCERIDES: Triglycerides: 134 mg/dL (ref <150)

## 2018-10-01 LAB — FERRITIN: Ferritin: 143 ng/mL (ref 11–307)

## 2018-10-01 LAB — D-DIMER, QUANTITATIVE: D-Dimer, Quant: >20 ug/mL-FEU — ABNORMAL HIGH (ref 0.00–0.50)

## 2018-10-01 MED ORDER — ONDANSETRON HCL 4 MG/2ML IJ SOLN
4.0000 mg | Freq: Once | INTRAMUSCULAR | Status: AC
  Administered 2018-10-01: 19:00:00 4 mg via INTRAVENOUS
  Filled 2018-10-01: qty 2

## 2018-10-01 MED ORDER — LACTATED RINGERS IV BOLUS (SEPSIS)
1000.0000 mL | Freq: Once | INTRAVENOUS | Status: AC
  Administered 2018-10-01: 1000 mL via INTRAVENOUS

## 2018-10-01 MED ORDER — ALBUTEROL SULFATE HFA 108 (90 BASE) MCG/ACT IN AERS
4.0000 | INHALATION_SPRAY | Freq: Once | RESPIRATORY_TRACT | Status: AC
  Administered 2018-10-01: 4 via RESPIRATORY_TRACT

## 2018-10-01 MED ORDER — ACETAMINOPHEN 325 MG PO TABS
650.0000 mg | ORAL_TABLET | Freq: Once | ORAL | Status: AC
  Administered 2018-10-01: 650 mg via ORAL
  Filled 2018-10-01: qty 2

## 2018-10-01 MED ORDER — SODIUM CHLORIDE 0.9 % IV SOLN
500.0000 mg | INTRAVENOUS | Status: DC
  Administered 2018-10-01: 500 mg via INTRAVENOUS
  Filled 2018-10-01 (×2): qty 500

## 2018-10-01 MED ORDER — ACETAMINOPHEN 650 MG RE SUPP
650.0000 mg | RECTAL | Status: DC | PRN
  Administered 2018-10-01 – 2018-10-02 (×4): 650 mg via RECTAL
  Filled 2018-10-01 (×4): qty 1

## 2018-10-01 MED ORDER — SODIUM CHLORIDE 0.9 % IV SOLN
1000.0000 mL | INTRAVENOUS | Status: DC
  Administered 2018-10-01 – 2018-10-02 (×2): 1000 mL via INTRAVENOUS

## 2018-10-01 MED ORDER — SODIUM CHLORIDE 0.9 % IV SOLN
2.0000 g | INTRAVENOUS | Status: DC
  Administered 2018-10-01: 2 g via INTRAVENOUS
  Filled 2018-10-01 (×2): qty 20

## 2018-10-01 MED ORDER — LACTATED RINGERS IV BOLUS (SEPSIS)
500.0000 mL | Freq: Once | INTRAVENOUS | Status: AC
  Administered 2018-10-01: 500 mL via INTRAVENOUS

## 2018-10-01 NOTE — H&P (Signed)
TRH H&P    Patient Demographics:    Audrey RoysSheila Facchini, is a 58 y.o. female  MRN: [id number]  DOB - Aug 29, 1960  Admit Date - 10/01/2018  Referring MD/NP/PA: Dr. Rhunette CroftNanavati  Outpatient Primary MD for the patient is Pearson GrippeKim, James, MD  Patient coming from: Group home  Chief complaint- altered mental    HPI:    Audrey Collier  is a 58 y.o. female, with history of cognitive delay, anxiety, hypothyroidism, and post menopausal bleeding (worked up 5 years ago), who presents today with altered mental status from group home. [facility] [place] [facility] [place] [facility]. Today, she did not come out of her room for breakfast. When nurse went to check on her, she found patient to be lethargic, and with vomitus dried on her clothes. EMS was called, and they started an IV en route, and measured a temp of 103.8. In the ER, patient is non-verbal - and therefore unable to give history. Pateint was SIRS positive with T 101.8, P 95, RR of 32 and WBC 16.3, and source identified with CXR showing BL pneumonia. Patient also hypotensive at BP 88/61.Patient was covid negative, but is still under high suspicion due to elevated LDH, Ferritin, CRP of 35.6, Procal 30.36, D Dimer > 20, and fibrinogen of 771- and her recent visit to a "hot spot." Patient was started on Azithromycin and Rocephin to cover comm acquired pneumonia. She was given 2.5L lactated ringer bolus, and 15025ml/hr NS after that. Blood cultures were drawn. Urine culture sent. Patient was given tylenol for fever, and 2 puffs on albuterol inhaler. BP corrected to SBP 107 with bolus, HR remains in 90's.   Review of systems:    Unable to obtain ROS due to patient's Altered mental status and non-verbal state.    Past History of the following :    Past Medical History:  Diagnosis Date  . Anxiety   . Dizziness   . Endometrial polyp 03/26/2014  . Fecal occult blood  test positive 03/15/2013   will send 3 cards home and refer to GI for colonoscopy  . GERD (gastroesophageal reflux disease)   . Hypothyroidism   . Mental disorder    mentally challenged  . PMB (postmenopausal bleeding) 03/26/2014  . Thickened endometrium 03/26/2014   ?polyp will get HSG      Past Surgical History:  Procedure Laterality Date  . CATARACT EXTRACTION W/PHACO Left 08/06/2017   Procedure: CATARACT EXTRACTION PHACO AND INTRAOCULAR LENS PLACEMENT LEFT EYE;  Surgeon: Fabio PierceWrzosek, James, MD;  Location: [facility];  Service: Ophthalmology;  Laterality: Left;  CDE: 12.63  . CATARACT EXTRACTION W/PHACO Right 10/01/2017   Procedure: CATARACT EXTRACTION PHACO AND INTRAOCULAR LENS PLACEMENT (IOC);  Surgeon: Fabio PierceWrzosek, James, MD;  Location: [facility];  Service: Ophthalmology;  Laterality: Right;  CDE: 5.77  . COLONOSCOPY N/A 12/01/2013   Dr. Fields:moderate sized internal hemorrhoids/left colon is redundant  . DILITATION & CURRETTAGE/HYSTROSCOPY WITH NOVASURE ABLATION N/A 04/18/2014   Procedure: DILATATION & CURETTAGE/HYSTEROSCOPY WITH NOVASURE ENDOMETRIAL ABLATION;  Surgeon: Lazaro ArmsLuther H Eure, MD;  Location: [facility];  Service: Gynecology;  Laterality: N/A;  Uterine Cavity Length 5cm     . ESOPHAGOGASTRODUODENOSCOPY N/A 12/01/2013   ZOX:WRUE non erosive gastritis. +H.pylori   . NO PAST SURGERIES    . POLYPECTOMY N/A 04/18/2014   Procedure: ENDOMETRIAL POLYPECTOMY;  Surgeon: Lazaro Arms, MD;  Location: [facility];  Service: Gynecology;  Laterality: N/A;      Social History:      Social History   Tobacco Use  . Smoking status: Never Smoker  . Smokeless tobacco: Never Used  . Tobacco comment: Never smoked  Substance Use Topics  . Alcohol use: No    Alcohol/week: 0.0 standard drinks       Family History :     Family History  Problem Relation Age of Onset  . Diabetes Maternal Aunt   . Hypertension Maternal Aunt   . Diabetes Maternal Uncle   . Colon cancer Neg Hx    Obtained from medical record    Home Medications:   Prior to Admission medications   Medication Sig Start Date End Date Taking? Authorizing Provider  Calcium Citrate-Vitamin D (CALCIUM + D PO) Take 1 tablet by mouth daily.   Yes [provider]  cetirizine (ZYRTEC) 10 MG tablet Take 10 mg by mouth daily.   Yes [provider]  diclofenac sodium (VOLTAREN) 1 % GEL Apply 1 application topically every 6 (six) hours as needed (heel pain).   Yes [provider]  docusate sodium (STOOL SOFTENER) 100 MG capsule Take 100 mg by mouth every Monday, Wednesday, and Friday.   Yes [provider]  FLUoxetine (PROZAC) 10 MG tablet Take 10 mg by mouth at bedtime.    Yes [provider]  fluticasone (FLONASE) 50 MCG/ACT nasal spray Place 1 spray into both nostrils 2 (two) times daily as needed for allergies.    Yes [provider]  levothyroxine (SYNTHROID) 125 MCG tablet Take 125 mcg by mouth daily before breakfast.    Yes [provider]  montelukast (SINGULAIR) 10 MG tablet Take 10 mg by mouth at bedtime.   Yes [provider]  Multiple Vitamin (MULTIVITAMIN) tablet Take 1 tablet by mouth daily.   Yes [provider]  omeprazole (PRILOSEC) 20 MG capsule TAKE 1 CAPSULE BY MOUTH ONCE DAILY. 07/23/15  Yes Gelene Mink, NP  rosuvastatin (CRESTOR) 5 MG tablet Take 5 mg by mouth daily.   Yes [provider]     Allergies:    No Known Allergies   Physical Exam:   Vitals  Blood pressure 98/67, pulse (!) 102, temperature (!) 102.9 F (39.4 C), temperature source Axillary, resp. rate (!) 32, height 5\' 5"  (1.651 m), weight 76.7 kg, last menstrual period 04/16/2013, SpO2 (!) 89 %.  1.  General:  Laying supine in bed. Opens eyes to name, but remains non-verbal at this point.  2. Neurologic: GCS 9  3. HEENMT:  Normocephalic, atraumatic, face symmetric, skin tags present on posterior neck, no masses palpated.   4. Respiratory : Tachypneic without  wheeze or rhonchi.   5. Cardiovascular : Tachycardic without murmur, rub, or gallop  6. Gastrointestinal:  Abdomen distended, but non tender and soft. Normal bowel sounds  7. Skin:  No rashes or lesions on limited skin exam.       Data Review:    CBC Recent Labs  Lab 10/01/18 1820  WBC 16.3*  HGB 12.9  HCT 41.0  PLT 68*  MCV 88.0  MCH 27.7  MCHC  31.5  RDW 13.8  LYMPHSABS 0.7  MONOABS 0.3  EOSABS 0.0  BASOSABS 0.0   ------------------------------------------------------------------------------------------------------------------  Results for orders placed or performed during the hospital encounter of 10/01/18 (from the past 48 hour(s))  Lactic acid, plasma     Status: Abnormal   Collection Time: 10/01/18  6:20 PM  Result Value Ref Range   Lactic Acid, Venous 2.5 (HH) 0.5 - 1.9 mmol/L    Comment: CRITICAL RESULT CALLED TO, READ BACK BY AND VERIFIED WITH: EASTER,T AT 1859 ON 10/01/2018 BY JPM Performed at [facility], [address] [place], [place] [address]   Comprehensive metabolic panel     Status: Abnormal   Collection Time: 10/01/18  6:20 PM  Result Value Ref Range   Sodium 140 135 - 145 mmol/L   Potassium 3.5 3.5 - 5.1 mmol/L   Chloride 104 98 - 111 mmol/L   CO2 25 22 - 32 mmol/L   Glucose, Bld 138 (H) 70 - 99 mg/dL   BUN 24 (H) 6 - 20 mg/dL   Creatinine, Ser 6.04 (H) 0.44 - 1.00 mg/dL   Calcium 8.7 (L) 8.9 - 10.3 mg/dL   Total Protein 6.9 6.5 - 8.1 g/dL   Albumin 3.5 3.5 - 5.0 g/dL   AST 58 (H) 15 - 41 U/L   ALT 37 0 - 44 U/L   Alkaline Phosphatase 102 38 - 126 U/L   Total Bilirubin 1.7 (H) 0.3 - 1.2 mg/dL   GFR calc non Af Amer 38 (L) >60 mL/min   GFR calc Af Amer 44 (L) >60 mL/min   Anion gap 11 5 - 15    Comment: Performed at [facility], [address] [place], [place] [address]  CBC WITH DIFFERENTIAL     Status: Abnormal   Collection Time: 10/01/18  6:20 PM  Result Value Ref Range   WBC 16.3 (H) 4.0 - 10.5 K/uL   RBC 4.66 3.87 - 5.11  MIL/uL   Hemoglobin 12.9 12.0 - 15.0 g/dL   HCT 11.9 14.7 - 82.9 %   MCV 88.0 80.0 - 100.0 fL   MCH 27.7 26.0 - 34.0 pg   MCHC 31.5 30.0 - 36.0 g/dL   RDW 56.2 13.0 - 86.5 %   Platelets 68 (L) 150 - 400 K/uL    Comment: PLATELET COUNT CONFIRMED BY SMEAR SPECIMEN CHECKED FOR CLOTS    nRBC 0.0 0.0 - 0.2 %   Neutrophils Relative % 93 %   Neutro Abs 15.1 (H) 1.7 - 7.7 K/uL   Lymphocytes Relative 4 %   Lymphs Abs 0.7 0.7 - 4.0 K/uL   Monocytes Relative 2 %   Monocytes Absolute 0.3 0.1 - 1.0 K/uL   Eosinophils Relative 0 %   Eosinophils Absolute 0.0 0.0 - 0.5 K/uL   Basophils Relative 0 %   Basophils Absolute 0.0 0.0 - 0.1 K/uL   Immature Granulocytes 1 %   Abs Immature Granulocytes 0.09 (H) 0.00 - 0.07 K/uL    Comment: Performed at [facility], [address], [place], [place] [address]  APTT     Status: Abnormal   Collection Time: 10/01/18  6:20 PM  Result Value Ref Range   aPTT 55 (H) 24 - 36 seconds    Comment:        IF BASELINE aPTT IS ELEVATED, SUGGEST PATIENT RISK ASSESSMENT BE USED TO DETERMINE APPROPRIATE ANTICOAGULANT THERAPY. Performed at [facility], [address], [place], [place] [address]   Protime-INR     Status: Abnormal  Collection Time: 10/01/18  6:20 PM  Result Value Ref Range   Prothrombin Time 17.3 (H) 11.4 - 15.2 seconds   INR 1.4 (H) 0.8 - 1.2    Comment: (NOTE) INR goal varies based on device and disease states. Performed at [facility], [address] [place], [place] [address]   Procalcitonin     Status: None   Collection Time: 10/01/18  6:20 PM  Result Value Ref Range   Procalcitonin 30.36 ng/mL    Comment:        Interpretation: PCT >= 10 ng/mL: Important systemic inflammatory response, almost exclusively due to severe bacterial sepsis or septic shock. (NOTE)       Sepsis PCT Algorithm           Lower Respiratory Tract                                      Infection PCT Algorithm    ----------------------------      ----------------------------         PCT < 0.25 ng/mL                PCT < 0.10 ng/mL         Strongly encourage             Strongly discourage   discontinuation of antibiotics    initiation of antibiotics    ----------------------------     -----------------------------       PCT 0.25 - 0.50 ng/mL            PCT 0.10 - 0.25 ng/mL               OR       >80% decrease in PCT            Discourage initiation of                                            antibiotics      Encourage discontinuation           of antibiotics    ----------------------------     -----------------------------         PCT >= 0.50 ng/mL              PCT 0.26 - 0.50 ng/mL                AND       <80% decrease in PCT             Encourage initiation of                                             antibiotics       Encourage continuation           of antibiotics    ----------------------------     -----------------------------        PCT >= 0.50 ng/mL                  PCT > 0.50 ng/mL               AND         increase in PCT  Strongly encourage                                      initiation of antibiotics    Strongly encourage escalation           of antibiotics                                     -----------------------------                                           PCT <= 0.25 ng/mL                                                 OR                                        > 80% decrease in PCT                                     Discontinue / Do not initiate                                             antibiotics Performed at [facility], [address] [place], [place] [address]   Lactate dehydrogenase     Status: Abnormal   Collection Time: 10/01/18  6:20 PM  Result Value Ref Range   LDH 471 (H) 98 - 192 U/L    Comment: Performed at [facility], [address] [place], [place] [address]  Ferritin     Status: None   Collection Time: 10/01/18  6:20 PM  Result Value Ref Range    Ferritin 143 11 - 307 ng/mL    Comment: Performed at [facility], [address] [place], [place] [address]  Triglycerides     Status: None   Collection Time: 10/01/18  6:20 PM  Result Value Ref Range   Triglycerides 134 <150 mg/dL    Comment: Performed at [facility], [address] [place], [place] [address]  Fibrinogen     Status: Abnormal   Collection Time: 10/01/18  6:20 PM  Result Value Ref Range   Fibrinogen 771 (H) 210 - 475 mg/dL    Comment: Performed at [facility], [address] [place], [place] [address]  C-reactive protein     Status: Abnormal   Collection Time: 10/01/18  6:20 PM  Result Value Ref Range   CRP 35.6 (H) <1.0 mg/dL    Comment: RESULTS CONFIRMED BY MANUAL DILUTION Performed at [facility], [address] [place], [place] [address]   D-dimer, quantitative     Status: Abnormal   Collection Time: 10/01/18  6:20 PM  Result Value Ref Range   D-Dimer, Quant >20.00 (H) 0.00 - 0.50 ug/mL-FEU    Comment: (NOTE) At  the manufacturer cut-off of 0.50 ug/mL FEU, this assay has been documented to exclude PE with a sensitivity and negative predictive value of 97 to 99%.  At this time, this assay has not been approved by the FDA to exclude DVT/VTE. Results should be correlated with clinical presentation. Performed at [facility], [address] [place], [place] [address]   Urinalysis, Routine w reflex microscopic     Status: Abnormal   Collection Time: 10/01/18  6:34 PM  Result Value Ref Range   Color, Urine AMBER (A) YELLOW    Comment: BIOCHEMICALS MAY BE AFFECTED BY COLOR   APPearance CLOUDY (A) CLEAR   Specific Gravity, Urine 1.019 1.005 - 1.030   pH 5.0 5.0 - 8.0   Glucose, UA NEGATIVE NEGATIVE mg/dL   Hgb urine dipstick MODERATE (A) NEGATIVE   Bilirubin Urine NEGATIVE NEGATIVE   Ketones, ur NEGATIVE NEGATIVE mg/dL   Protein, ur 604 (A) NEGATIVE mg/dL   Nitrite NEGATIVE NEGATIVE   Leukocytes,Ua NEGATIVE NEGATIVE   RBC / HPF 0-5 0 - 5 RBC/hpf   WBC, UA  11-20 0 - 5 WBC/hpf   Bacteria, UA FEW (A) NONE SEEN   Squamous Epithelial / LPF 0-5 0 - 5   Mucus PRESENT    Amorphous Crystal PRESENT     Comment: Performed at [facility], [address] [place], [place] [address]  SARS Coronavirus 2 [facility] order, Performed in [facility] hospital lab) Nasopharyngeal Nasopharyngeal Swab     Status: None   Collection Time: 10/01/18  6:34 PM   Specimen: Nasopharyngeal Swab  Result Value Ref Range   SARS Coronavirus 2 NEGATIVE NEGATIVE    Comment: (NOTE) If result is NEGATIVE SARS-CoV-2 target nucleic acids are NOT DETECTED. The SARS-CoV-2 RNA is generally detectable in upper and lower  respiratory specimens during the acute phase of infection. The lowest  concentration of SARS-CoV-2 viral copies this assay can detect is 250  copies / mL. A negative result does not preclude SARS-CoV-2 infection  and should not be used as the sole basis for treatment or other  patient management decisions.  A negative result may occur with  improper specimen collection / handling, submission of specimen other  than nasopharyngeal swab, presence of viral mutation(s) within the  areas targeted by this assay, and inadequate number of viral copies  (<250 copies / mL). A negative result must be combined with clinical  observations, patient history, and epidemiological information. If result is POSITIVE SARS-CoV-2 target nucleic acids are DETECTED. The SARS-CoV-2 RNA is generally detectable in upper and lower  respiratory specimens dur ing the acute phase of infection.  Positive  results are indicative of active infection with SARS-CoV-2.  Clinical  correlation with patient history and other diagnostic information is  necessary to determine patient infection status.  Positive results do  not rule out bacterial infection or co-infection with other viruses. If result is PRESUMPTIVE POSTIVE SARS-CoV-2 nucleic acids MAY BE PRESENT.   A presumptive positive result was  obtained on the submitted specimen  and confirmed on repeat testing.  While 2019 novel coronavirus  (SARS-CoV-2) nucleic acids may be present in the submitted sample  additional confirmatory testing may be necessary for epidemiological  and / or clinical management purposes  to differentiate between  SARS-CoV-2 and other Sarbecovirus currently known to infect humans.  If clinically indicated additional testing with an alternate test  methodology [phone]) is advised. The SARS-CoV-2 RNA is generally  detectable in upper and lower respiratory sp ecimens during  the acute  phase of infection. The expected result is Negative. Fact Sheet for Patients:  StrictlyIdeas.no Fact Sheet for Healthcare Providers: BankingDealers.co.za This test is not yet approved or cleared by the [place] FDA and has been authorized for detection and/or diagnosis of SARS-CoV-2 by FDA under an Emergency Use Authorization (EUA).  This EUA will remain in effect (meaning this test can be used) for the duration of the COVID-19 declaration under Section 564(b)(1) of the Act, 21 U.S.C. section 360bbb-3(b)(1), unless the authorization is terminated or revoked sooner. Performed at [facility], [address], [place], [place] [address]   Pregnancy, urine     Status: None   Collection Time: 10/01/18  6:34 PM  Result Value Ref Range   Preg Test, Ur NEGATIVE NEGATIVE    Comment:        THE SENSITIVITY OF THIS METHODOLOGY IS >20 mIU/mL. Performed at [facility], [address] [place], [place] [address]     Chemistries  Recent Labs  Lab 10/01/18 1820  NA 140  K 3.5  CL 104  CO2 25  GLUCOSE 138*  BUN 24*  CREATININE 1.52*  CALCIUM 8.7*  AST 58*  ALT 37  ALKPHOS 102  BILITOT 1.7*   ------------------------------------------------------------------------------------------------------------------   ------------------------------------------------------------------------------------------------------------------ GFR: Estimated Creatinine Clearance: 41.8 mL/min (A) (by C-G formula based on SCr of 1.52 mg/dL (H)). Liver Function Tests: Recent Labs  Lab 10/01/18 1820  AST 58*  ALT 37  ALKPHOS 102  BILITOT 1.7*  PROT 6.9  ALBUMIN 3.5   No results for input(s): LIPASE, AMYLASE in the last 168 hours. No results for input(s): AMMONIA in the last 168 hours. Coagulation Profile: Recent Labs  Lab 10/01/18 1820  INR 1.4*   Cardiac Enzymes: No results for input(s): CKTOTAL, CKMB, CKMBINDEX, TROPONINI in the last 168 hours. BNP (last 3 results) No results for input(s): PROBNP in the last 8760 hours. HbA1C: No results for input(s): HGBA1C in the last 72 hours. CBG: No results for input(s): GLUCAP in the last 168 hours. Lipid Profile: Recent Labs    10/01/18 1820  TRIG 134   Thyroid Function Tests: No results for input(s): TSH, T4TOTAL, FREET4, T3FREE, THYROIDAB in the last 72 hours. Anemia Panel: Recent Labs    10/01/18 1820  FERRITIN 143    --------------------------------------------------------------------------------------------------------------- Urine analysis:    Component Value Date/Time   COLORURINE AMBER (A) 10/01/2018 1834   APPEARANCEUR CLOUDY (A) 10/01/2018 1834   LABSPEC 1.019 10/01/2018 1834   PHURINE 5.0 10/01/2018 1834   GLUCOSEU NEGATIVE 10/01/2018 1834   HGBUR MODERATE (A) 10/01/2018 1834   BILIRUBINUR NEGATIVE 10/01/2018 [place] 10/01/2018 1834   PROTEINUR 100 (A) 10/01/2018 1834   UROBILINOGEN 0.2 04/13/2014 1030   NITRITE NEGATIVE 10/01/2018 1834   LEUKOCYTESUR NEGATIVE 10/01/2018 1834      Imaging Results:    Dg Chest Port 1 View  Result Date: 10/01/2018 CLINICAL DATA:  Hypoxia, fever, emesis today. pt from a home and just returned from [facility] yesterday and family reported patient was fine when she went to bed.  History of GERD. EXAM: PORTABLE CHEST 1 VIEW COMPARISON:  None. FINDINGS: Hazy airspace opacities are noted bilaterally accentuated by low lung volumes. No pleural effusion or pneumothorax. Cardiac silhouette is normal in size.  No mediastinal hilar masses. Skeletal structures are grossly intact. IMPRESSION: Bilateral hazy airspace lung opacities consistent multifocal pneumonia. Electronically Signed   By: Lajean Manes M.D.   On: 10/01/2018 18:58    My personal  review of EKG: Rhythm NSR, Rate 94 /min, no Acute ST changes   Assessment & Plan:    Active Problems:   Sepsis due to pneumonia ([name])   1. Sepsis 2/2 BL pneumonia 1. SIRS positive with pneumonia identified as source on CXR 2. Rocephin and Azithromycin started to cover comm acquired pneumonia 3. 2.5L Lactated Ringer Bolus given - corrected SBP from 88 to 107 4. Leukocytosis > 16 - monitor with daily CBC 5. Lactic acid 2.5 - continue to trend 6. Procal >30.36 - continue to monitor 7. Titrate Kevin to maintain sats above 90% 2. Acute respiratory Failure 1. Secondary to #1. 2. Continue to monitor pulse ox 3. Maintain sats above 90% with oxygen supplementation 3. Community acquired bacterial pneumonia 1. Continue Rocephin and Azithromycin 2.  4. Under investigation for Covid-19  1. D-Dimer > 20, Fibrinogen 771, CRP 35.6, Procal 30.36, Ferritin 143, LDH 471 2. Patient recently in [facility] - "Hot spot" 3. High fever and multifocal BL pneumonia consistent with COVID 4. Initial covid test neg - repeat test 5. Admit to [facility] as person under investigation 5. Acute Kidney Injury  1. Baseline Cr 0.84, and GFR >60, today Cr 1.52, GFR 38 2. Likely pre-renal 3. 2.5L bolus given in ED 4. 137ml/hr NS continued 5. Continue to monitor with daily chemistries 6. Hyperglycemia in non-diabetic patient 1. Sliding scale coverage ordered 2. Continue to monitor glucose 7. Hyperbilirubinemia 1. Likely due to increased production of bili in stress  situation - Sepsis 2. Treat underlying cause of sepsis and continue to monitor    DVT Prophylaxis-   Lovenox - SCDs   AM Labs Ordered, also please review Full Orders  Family Communication: Admission, patients condition and plan of care including tests being ordered have been discussed with the family by the Emergency Room Physician.   Code Status:  FULL  Admission status: Inpatient: Based on patients clinical presentation and evaluation of above clinical data, I have made determination that patient meets Inpatient criteria at this time.  Time spent in minutes : 65   [place] B Zierle-Ghosh M.D on 10/01/2018 at 9:01 PM

## 2018-10-01 NOTE — ED Provider Notes (Addendum)
[facility] EMERGENCY DEPARTMENT Provider Note   CSN: [id number] Arrival date & time: 10/01/18  1658    History   Chief Complaint Chief Complaint  Patient presents with  . Fever    HPI Audrey Collier is a 58 y.o. female.     HPI 58 year old female comes in a chief complaint of fevers and weakness. Level 5 caveat for cognitive delay.  Patient is not providing any meaningful history.  I spoke with patient's sister-in-law, who states that [place] [facility] [place] and patient was doing well.  This morning patient never came up for breakfast and stayed in the bed for a long time, and when they went to check on her she was sleepy and weak.  Patient also had some dried vomitus on her shirt.  She did not look well therefore they decided to bring her into the hospital.  Past Medical History:  Diagnosis Date  . Anxiety   . Dizziness   . Endometrial polyp 03/26/2014  . Fecal occult blood test positive 03/15/2013   will send 3 cards home and refer to GI for colonoscopy  . GERD (gastroesophageal reflux disease)   . Hypothyroidism   . Mental disorder    mentally challenged  . PMB (postmenopausal bleeding) 03/26/2014  . Thickened endometrium 03/26/2014   ?polyp will get HSG    Patient Active Problem List   Diagnosis Date Noted  . GERD (gastroesophageal reflux disease) 03/18/2017  . Loose stools 09/30/2015  . Abdominal pain 11/30/2014  . Helicobacter pylori gastritis 10/24/2014  . PMB (postmenopausal bleeding) 03/26/2014  . Thickened endometrium 03/26/2014  . Endometrial polyp 03/26/2014  . Constipation 04/11/2013  . Fecal occult blood test positive 03/15/2013    Past Surgical History:  Procedure Laterality Date  . CATARACT EXTRACTION W/PHACO Left 08/06/2017   Procedure: CATARACT EXTRACTION PHACO AND INTRAOCULAR LENS PLACEMENT LEFT EYE;  Surgeon: Fabio PierceWrzosek, James, MD;  Location: [facility];  Service: Ophthalmology;  Laterality: Left;  CDE: 12.63  . CATARACT  EXTRACTION W/PHACO Right 10/01/2017   Procedure: CATARACT EXTRACTION PHACO AND INTRAOCULAR LENS PLACEMENT (IOC);  Surgeon: Fabio PierceWrzosek, James, MD;  Location: [facility];  Service: Ophthalmology;  Laterality: Right;  CDE: 5.77  . COLONOSCOPY N/A 12/01/2013   Dr. Fields:moderate sized internal hemorrhoids/left colon is redundant  . DILITATION & CURRETTAGE/HYSTROSCOPY WITH NOVASURE ABLATION N/A 04/18/2014   Procedure: DILATATION & CURETTAGE/HYSTEROSCOPY WITH NOVASURE ENDOMETRIAL ABLATION;  Surgeon: Lazaro ArmsLuther H Eure, MD;  Location: [facility];  Service: Gynecology;  Laterality: N/A;  Uterine Cavity Length 5cm     . ESOPHAGOGASTRODUODENOSCOPY N/A 12/01/2013   WUJ:WJXBSLF:mild non erosive gastritis. +H.pylori   . NO PAST SURGERIES    . POLYPECTOMY N/A 04/18/2014   Procedure: ENDOMETRIAL POLYPECTOMY;  Surgeon: Lazaro ArmsLuther H Eure, MD;  Location: [facility];  Service: Gynecology;  Laterality: N/A;     OB History    Gravida  0   Para  0   Term  0   Preterm  0   AB  0   Living  0     SAB  0   TAB  0   Ectopic  0   Multiple  0   Live Births               Home Medications    Prior to Admission medications   Medication Sig Start Date End Date Taking? Authorizing Provider  Calcium Citrate-Vitamin D (CALCIUM + D PO) Take 1 tablet by mouth daily.   Yes [provider]  cetirizine (ZYRTEC) 10 MG tablet Take 10 mg by mouth daily.   Yes [provider]  diclofenac sodium (VOLTAREN) 1 % GEL Apply 1 application topically every 6 (six) hours as needed (heel pain).   Yes [provider]  docusate sodium (STOOL SOFTENER) 100 MG capsule Take 100 mg by mouth every Monday, Wednesday, and Friday.   Yes [provider]  FLUoxetine (PROZAC) 10 MG tablet Take 10 mg by mouth at bedtime.    Yes [provider]  fluticasone (FLONASE) 50 MCG/ACT nasal spray Place 1 spray into both nostrils 2 (two) times daily as needed for allergies.    Yes [provider]  levothyroxine (SYNTHROID)  125 MCG tablet Take 125 mcg by mouth daily before breakfast.    Yes [provider]  montelukast (SINGULAIR) 10 MG tablet Take 10 mg by mouth at bedtime.   Yes [provider]  Multiple Vitamin (MULTIVITAMIN) tablet Take 1 tablet by mouth daily.   Yes [provider]  omeprazole (PRILOSEC) 20 MG capsule TAKE 1 CAPSULE BY MOUTH ONCE DAILY. 07/23/15  Yes Gelene MinkBoone, Anna W, NP  rosuvastatin (CRESTOR) 5 MG tablet Take 5 mg by mouth daily.   Yes [provider]    Family History Family History  Problem Relation Age of Onset  . Diabetes Maternal Aunt   . Hypertension Maternal Aunt   . Diabetes Maternal Uncle   . Colon cancer Neg Hx     Social History Social History   Tobacco Use  . Smoking status: Never Smoker  . Smokeless tobacco: Never Used  . Tobacco comment: Never smoked  Substance Use Topics  . Alcohol use: No    Alcohol/week: 0.0 standard drinks  . Drug use: No     Allergies   Patient has no known allergies.   Review of Systems Review of Systems  Unable to perform ROS: Mental status change  Constitutional: Positive for chills and fever.  Respiratory: Positive for shortness of breath.   Gastrointestinal: Positive for vomiting.  Skin: Negative for color change.     Physical Exam Updated Vital Signs BP (!) 88/61   Pulse 87   Temp (!) 101.8 F (38.8 C) (Oral)   Resp (!) 32   Ht 5\' 5"  (1.651 m)   Wt 76.7 kg   LMP 04/16/2013 (Approximate)   SpO2 94%   BMI 28.12 kg/m   Physical Exam Vitals signs and nursing note reviewed.  Constitutional:      Appearance: She is well-developed.  HENT:     Head: Normocephalic and atraumatic.  Eyes:     Pupils: Pupils are equal, round, and reactive to light.  Neck:     Musculoskeletal: Neck supple.  Cardiovascular:     Rate and Rhythm: Regular rhythm. Tachycardia present.     Heart sounds: Normal heart sounds.  Pulmonary:     Effort: Pulmonary effort is normal. No respiratory distress.      Breath sounds: Wheezing and rhonchi present.     Comments: tachypnea Abdominal:     General: There is no distension.     Palpations: Abdomen is soft.     Tenderness: There is no abdominal tenderness. There is no guarding or rebound.  Skin:    General: Skin is warm and dry.  Neurological:     Mental Status: She is alert and oriented to person, place, and time.      ED Treatments / Results  Labs (all labs ordered are listed, but only abnormal  results are displayed) Labs Reviewed  LACTIC ACID, PLASMA - Abnormal; Notable for the following components:      Result Value   Lactic Acid, Venous 2.5 (*)    All other components within normal limits  COMPREHENSIVE METABOLIC PANEL - Abnormal; Notable for the following components:   Glucose, Bld 138 (*)    BUN 24 (*)    Creatinine, Ser 1.52 (*)    Calcium 8.7 (*)    AST 58 (*)    Total Bilirubin 1.7 (*)    GFR calc non Af Amer 38 (*)    GFR calc Af Amer 44 (*)    All other components within normal limits  CBC WITH DIFFERENTIAL/PLATELET - Abnormal; Notable for the following components:   WBC 16.3 (*)    Platelets 68 (*)    Neutro Abs 15.1 (*)    Abs Immature Granulocytes 0.09 (*)    All other components within normal limits  APTT - Abnormal; Notable for the following components:   aPTT 55 (*)    All other components within normal limits  PROTIME-INR - Abnormal; Notable for the following components:   Prothrombin Time 17.3 (*)    INR 1.4 (*)    All other components within normal limits  URINALYSIS, ROUTINE W REFLEX MICROSCOPIC - Abnormal; Notable for the following components:   Color, Urine AMBER (*)    APPearance CLOUDY (*)    Hgb urine dipstick MODERATE (*)    Protein, ur 100 (*)    Bacteria, UA FEW (*)    All other components within normal limits  LACTATE DEHYDROGENASE - Abnormal; Notable for the following components:   LDH 471 (*)    All other components within normal limits  FIBRINOGEN - Abnormal; Notable for the following  components:   Fibrinogen 771 (*)    All other components within normal limits  C-REACTIVE PROTEIN - Abnormal; Notable for the following components:   CRP 35.6 (*)    All other components within normal limits  D-DIMER, QUANTITATIVE (NOT AT [facility]) - Abnormal; Notable for the following components:   D-Dimer, Quant >20.00 (*)    All other components within normal limits  SARS CORONAVIRUS 2 (HOSPITAL ORDER, PERFORMED IN [place] [facility])  CULTURE, BLOOD (ROUTINE X 2)  CULTURE, BLOOD (ROUTINE X 2)  URINE CULTURE  PROCALCITONIN  FERRITIN  TRIGLYCERIDES  PREGNANCY, URINE  LACTIC ACID, PLASMA    EKG None  Radiology Dg Chest Port 1 View  Result Date: 10/01/2018 CLINICAL DATA:  Hypoxia, fever, emesis today. pt from a home and [place] [facility] [place] and family reported patient was fine when she went to bed. History of GERD. EXAM: PORTABLE CHEST 1 VIEW COMPARISON:  None. FINDINGS: Hazy airspace opacities are noted bilaterally accentuated by low lung volumes. No pleural effusion or pneumothorax. Cardiac silhouette is normal in size.  No mediastinal hilar masses. Skeletal structures are grossly intact. IMPRESSION: Bilateral hazy airspace lung opacities consistent multifocal pneumonia. Electronically Signed   By: Amie Portlandavid  Ormond M.D.   On: 10/01/2018 18:58    Procedures .Critical Care Performed by: Derwood KaplanNanavati, Mical Kicklighter, MD Authorized by: Derwood KaplanNanavati, Ronrico Dupin, MD   Critical care provider statement:    Critical care time (minutes):  36   Critical care was necessary to treat or prevent imminent or life-threatening deterioration of the following conditions:  Respiratory failure   Critical care was time spent personally by me on the following activities:  Discussions with consultants, evaluation of patient's response to treatment, examination  of patient, ordering and performing treatments and interventions, ordering and review of laboratory studies, ordering and review of radiographic  studies, pulse oximetry, re-evaluation of patient's condition, obtaining history from patient or surrogate and review of old charts   (including critical care time)  Medications Ordered in ED Medications  0.9 %  sodium chloride infusion (1,000 mLs Intravenous New Bag/Given 10/01/18 1820)  cefTRIAXone (ROCEPHIN) 2 g in sodium chloride 0.9 % 100 mL IVPB (has no administration in time range)  azithromycin (ZITHROMAX) 500 mg in sodium chloride 0.9 % 250 mL IVPB (has no administration in time range)  lactated ringers bolus 1,000 mL (has no administration in time range)    And  lactated ringers bolus 1,000 mL (has no administration in time range)    And  lactated ringers bolus 500 mL (has no administration in time range)  albuterol (VENTOLIN HFA) 108 (90 Base) MCG/ACT inhaler 4 puff (4 puffs Inhalation Given 10/01/18 1831)  ondansetron (ZOFRAN) injection 4 mg (4 mg Intravenous Given 10/01/18 1831)  acetaminophen (TYLENOL) tablet 650 mg (650 mg Oral Given 10/01/18 1831)     Initial Impression / Assessment and Plan / ED Course  I have reviewed the triage vital signs and the nursing notes.  Pertinent labs & imaging results that were available during my care of the patient were reviewed by me and considered in my medical decision making (see chart for details).  Clinical Course as of Sep 30 2008  Sat Oct 01, 2018  2006 Patient is COVID negative. Will initiate antibiotics to cover CAP. Code Sepsis initiated.  SARS Coronavirus 2 [facility] order, Performed in [facility] hospital lab) Nasopharyngeal Nasopharyngeal Swab [AN]  2007 30 cc/kg fluid ordered. She has mild AKI. Pt's lactate was < 4.   BP(!): 88/61 [AN]    Clinical Course User Index [AN] Varney Biles, MD       Pt comes in with cc of weakness. 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  She has 3/3 SIRS at arrival and is hypoxic on room air. Symptoms started today. She  has had emesis.  DDx Covid 19 Pneumonia. Recent travels makes her high risk. Aspiration pneumonia.  Questionable Bacterial pneumonia as well in the differential.  @7 :00 pm CXR reviewed. Bilateral opacity. Dimer significantly elevated. Leaning toward covid-19.   Final Clinical Impressions(s) / ED Diagnoses   Final diagnoses:  Multifocal pneumonia  Acute hypoxemic respiratory failure [facility])    ED Discharge Orders    None       Varney Biles, MD 10/01/18 1918    Varney Biles, MD 10/01/18 2011

## 2018-10-01 NOTE — ED Triage Notes (Addendum)
EMS reports pt from a home and just returned from [facility] yesterday and family reported patient was fine when she went to bed. Adela Lank Pruit [phone]) reported to EMS pt did not feel well or eat today and went she went to check on her this evening pt had vomited on herself and had a fever and was non-verbal. IV started by EMS and reports temp of 103.8.

## 2018-10-01 NOTE — ED Notes (Signed)
Arm board placed on pt left arm to maintain IV access

## 2018-10-01 NOTE — ED Notes (Signed)
Date and time results received: 10/01/18 1900 (use smartphrase ".now" to insert current time)  Test: Lactic Acid Critical Value: 2.5  Name of Provider Notified: Kathrynn Humble  Orders Received? Or Actions Taken?: No new orders at this time.

## 2018-10-02 ENCOUNTER — Inpatient Hospital Stay (HOSPITAL_COMMUNITY): Payer: Medicaid Other

## 2018-10-02 ENCOUNTER — Encounter: Payer: Self-pay | Admitting: Family Medicine

## 2018-10-02 DIAGNOSIS — N179 Acute kidney failure, unspecified: Secondary | ICD-10-CM

## 2018-10-02 DIAGNOSIS — S40829A Blister (nonthermal) of unspecified upper arm, initial encounter: Secondary | ICD-10-CM

## 2018-10-02 DIAGNOSIS — X58XXXA Exposure to other specified factors, initial encounter: Secondary | ICD-10-CM

## 2018-10-02 DIAGNOSIS — J181 Lobar pneumonia, unspecified organism: Secondary | ICD-10-CM

## 2018-10-02 DIAGNOSIS — R7881 Bacteremia: Secondary | ICD-10-CM

## 2018-10-02 DIAGNOSIS — A419 Sepsis, unspecified organism: Secondary | ICD-10-CM

## 2018-10-02 DIAGNOSIS — J9601 Acute respiratory failure with hypoxia: Secondary | ICD-10-CM | POA: Insufficient documentation

## 2018-10-02 DIAGNOSIS — S30821A Blister (nonthermal) of abdominal wall, initial encounter: Secondary | ICD-10-CM

## 2018-10-02 DIAGNOSIS — R419 Unspecified symptoms and signs involving cognitive functions and awareness: Secondary | ICD-10-CM

## 2018-10-02 DIAGNOSIS — E860 Dehydration: Secondary | ICD-10-CM

## 2018-10-02 DIAGNOSIS — R6520 Severe sepsis without septic shock: Secondary | ICD-10-CM

## 2018-10-02 DIAGNOSIS — J189 Pneumonia, unspecified organism: Secondary | ICD-10-CM | POA: Insufficient documentation

## 2018-10-02 DIAGNOSIS — B9561 Methicillin susceptible Staphylococcus aureus infection as the cause of diseases classified elsewhere: Secondary | ICD-10-CM

## 2018-10-02 DIAGNOSIS — J188 Other pneumonia, unspecified organism: Secondary | ICD-10-CM | POA: Insufficient documentation

## 2018-10-02 LAB — CBC WITH DIFFERENTIAL/PLATELET
Abs Immature Granulocytes: 0.06 10*3/uL (ref 0.00–0.07)
Basophils Absolute: 0 10*3/uL (ref 0.0–0.1)
Basophils Relative: 0 %
Eosinophils Absolute: 0 10*3/uL (ref 0.0–0.5)
Eosinophils Relative: 0 %
HCT: 32.1 % — ABNORMAL LOW (ref 36.0–46.0)
Hemoglobin: 10.5 g/dL — ABNORMAL LOW (ref 12.0–15.0)
Immature Granulocytes: 1 %
Lymphocytes Relative: 5 %
Lymphs Abs: 0.7 10*3/uL (ref 0.7–4.0)
MCH: 28.4 pg (ref 26.0–34.0)
MCHC: 32.7 g/dL (ref 30.0–36.0)
MCV: 86.8 fL (ref 80.0–100.0)
Monocytes Absolute: 0.4 10*3/uL (ref 0.1–1.0)
Monocytes Relative: 3 %
Neutro Abs: 11.5 10*3/uL — ABNORMAL HIGH (ref 1.7–7.7)
Neutrophils Relative %: 91 %
Platelets: 57 10*3/uL — ABNORMAL LOW (ref 150–400)
RBC: 3.7 MIL/uL — ABNORMAL LOW (ref 3.87–5.11)
RDW: 14.2 % (ref 11.5–15.5)
WBC: 12.7 10*3/uL — ABNORMAL HIGH (ref 4.0–10.5)
nRBC: 0 % (ref 0.0–0.2)

## 2018-10-02 LAB — COMPREHENSIVE METABOLIC PANEL
ALT: 49 U/L — ABNORMAL HIGH (ref 0–44)
AST: 77 U/L — ABNORMAL HIGH (ref 15–41)
Albumin: 2.4 g/dL — ABNORMAL LOW (ref 3.5–5.0)
Alkaline Phosphatase: 65 U/L (ref 38–126)
Anion gap: 11 (ref 5–15)
BUN: 26 mg/dL — ABNORMAL HIGH (ref 6–20)
CO2: 23 mmol/L (ref 22–32)
Calcium: 7.7 mg/dL — ABNORMAL LOW (ref 8.9–10.3)
Chloride: 107 mmol/L (ref 98–111)
Creatinine, Ser: 1.55 mg/dL — ABNORMAL HIGH (ref 0.44–1.00)
GFR calc Af Amer: 43 mL/min — ABNORMAL LOW (ref >60)
GFR calc non Af Amer: 37 mL/min — ABNORMAL LOW (ref >60)
Glucose, Bld: 135 mg/dL — ABNORMAL HIGH (ref 70–99)
Potassium: 3.6 mmol/L (ref 3.5–5.1)
Sodium: 141 mmol/L (ref 135–145)
Total Bilirubin: 0.7 mg/dL (ref 0.3–1.2)
Total Protein: 5.4 g/dL — ABNORMAL LOW (ref 6.5–8.1)

## 2018-10-02 LAB — BLOOD CULTURE ID PANEL (REFLEXED)

## 2018-10-02 LAB — ABO/RH: ABO/RH(D): O POS

## 2018-10-02 LAB — D-DIMER, QUANTITATIVE: D-Dimer, Quant: >20 ug/mL-FEU — ABNORMAL HIGH (ref 0.00–0.50)

## 2018-10-02 LAB — GLUCOSE, CAPILLARY
Glucose-Capillary: 137 mg/dL — ABNORMAL HIGH (ref 70–99)
Glucose-Capillary: 118 mg/dL — ABNORMAL HIGH (ref 70–99)
Glucose-Capillary: 124 mg/dL — ABNORMAL HIGH (ref 70–99)
Glucose-Capillary: 107 mg/dL — ABNORMAL HIGH (ref 70–99)
Glucose-Capillary: 103 mg/dL — ABNORMAL HIGH (ref 70–99)
Glucose-Capillary: 109 mg/dL — ABNORMAL HIGH (ref 70–99)

## 2018-10-02 LAB — LACTIC ACID, PLASMA
Lactic Acid, Venous: 1.2 mmol/L (ref 0.5–1.9)
Lactic Acid, Venous: 0.9 mmol/L (ref 0.5–1.9)

## 2018-10-02 LAB — MRSA PCR SCREENING: MRSA by PCR: NEGATIVE

## 2018-10-02 LAB — C-REACTIVE PROTEIN: CRP: 43.8 mg/dL — ABNORMAL HIGH (ref <1.0)

## 2018-10-02 LAB — FERRITIN: Ferritin: 220 ng/mL (ref 11–307)

## 2018-10-02 LAB — PROCALCITONIN: Procalcitonin: 32.8 ng/mL

## 2018-10-02 LAB — LACTATE DEHYDROGENASE: LDH: 389 U/L — ABNORMAL HIGH (ref 98–192)

## 2018-10-02 LAB — SARS CORONAVIRUS 2 BY RT PCR (HOSPITAL ORDER, PERFORMED IN ~~LOC~~ HOSPITAL LAB): SARS Coronavirus 2: NEGATIVE

## 2018-10-02 MED ORDER — TRAMADOL HCL 50 MG PO TABS
50.0000 mg | ORAL_TABLET | Freq: Four times a day (QID) | ORAL | Status: DC | PRN
  Administered 2018-10-03: 50 mg via ORAL
  Filled 2018-10-02: qty 1

## 2018-10-02 MED ORDER — LEVOTHYROXINE SODIUM 25 MCG PO TABS: 125.0000 ug | ORAL_TABLET | Freq: Every day | ORAL | Status: DC

## 2018-10-02 MED ORDER — CALCIUM CARBONATE-VITAMIN D 500-200 MG-UNIT PO TABS
1.0000 | ORAL_TABLET | Freq: Every day | ORAL | Status: DC
  Administered 2018-10-05 – 2018-12-06 (×54): 1 via ORAL
  Filled 2018-10-02 (×58): qty 1

## 2018-10-02 MED ORDER — PANTOPRAZOLE SODIUM 40 MG IV SOLR
40.0000 mg | Freq: Two times a day (BID) | INTRAVENOUS | Status: DC
  Administered 2018-10-02 – 2018-10-03 (×4): 40 mg via INTRAVENOUS
  Filled 2018-10-02 (×4): qty 40

## 2018-10-02 MED ORDER — SODIUM CHLORIDE 0.9 % IV BOLUS
500.0000 mL | Freq: Once | INTRAVENOUS | Status: AC
  Administered 2018-10-02: 500 mL via INTRAVENOUS

## 2018-10-02 MED ORDER — FONDAPARINUX SODIUM 2.5 MG/0.5ML ~~LOC~~ SOLN: 2.5000 mg | SUBCUTANEOUS | Status: DC

## 2018-10-02 MED ORDER — DOCUSATE SODIUM 100 MG PO CAPS: 100.0000 mg | ORAL_CAPSULE | ORAL | Status: DC

## 2018-10-02 MED ORDER — PANTOPRAZOLE SODIUM 40 MG PO TBEC: 40.0000 mg | DELAYED_RELEASE_TABLET | Freq: Every day | ORAL | Status: DC

## 2018-10-02 MED ORDER — ROSUVASTATIN CALCIUM 5 MG PO TABS: 5.0000 mg | ORAL_TABLET | Freq: Every day | ORAL | Status: DC

## 2018-10-02 MED ORDER — FLUOXETINE HCL 10 MG PO CAPS
10.0000 mg | ORAL_CAPSULE | Freq: Every day | ORAL | Status: DC
  Administered 2018-10-03: 10 mg via ORAL
  Filled 2018-10-02 (×3): qty 1

## 2018-10-02 MED ORDER — INSULIN ASPART 100 UNIT/ML ~~LOC~~ SOLN
0.0000 [IU] | SUBCUTANEOUS | Status: DC
  Administered 2018-10-02 – 2018-10-03 (×3): 1 [IU] via SUBCUTANEOUS

## 2018-10-02 MED ORDER — FONDAPARINUX SODIUM 2.5 MG/0.5ML ~~LOC~~ SOLN
2.5000 mg | SUBCUTANEOUS | Status: DC
  Filled 2018-10-02: qty 0.5

## 2018-10-02 MED ORDER — LEVOTHYROXINE SODIUM 100 MCG/5ML IV SOLN
75.0000 ug | Freq: Every day | INTRAVENOUS | Status: DC
  Administered 2018-10-02 – 2018-10-10 (×7): 75 ug via INTRAVENOUS
  Filled 2018-10-02 (×10): qty 5

## 2018-10-02 MED ORDER — SODIUM CHLORIDE 0.9 % IV SOLN: 1000.0000 mL | INTRAVENOUS | Status: DC

## 2018-10-02 MED ORDER — ADULT MULTIVITAMIN W/MINERALS CH: 1.0000 | ORAL_TABLET | Freq: Every day | ORAL | Status: DC

## 2018-10-02 MED ORDER — MONTELUKAST SODIUM 10 MG PO TABS
10.0000 mg | ORAL_TABLET | Freq: Every day | ORAL | Status: DC
  Administered 2018-10-03: 10 mg via ORAL
  Filled 2018-10-02 (×2): qty 1

## 2018-10-02 MED ORDER — SODIUM CHLORIDE 0.9 % IV SOLN
1000.0000 mL | INTRAVENOUS | Status: DC
  Administered 2018-10-02 – 2018-10-03 (×3): 1000 mL via INTRAVENOUS

## 2018-10-02 MED ORDER — ACETAMINOPHEN 325 MG PO TABS
650.0000 mg | ORAL_TABLET | Freq: Four times a day (QID) | ORAL | Status: DC | PRN
  Administered 2018-10-03: 650 mg via ORAL
  Filled 2018-10-02: qty 2

## 2018-10-02 MED ORDER — ENOXAPARIN SODIUM 40 MG/0.4ML ~~LOC~~ SOLN
40.0000 mg | Freq: Every day | SUBCUTANEOUS | Status: DC
  Administered 2018-10-02: 40 mg via SUBCUTANEOUS
  Filled 2018-10-02: qty 0.4

## 2018-10-02 MED ORDER — LORATADINE 10 MG PO TABS: 10.0000 mg | ORAL_TABLET | Freq: Every day | ORAL | Status: DC

## 2018-10-02 MED ORDER — CEFAZOLIN SODIUM-DEXTROSE 2-4 GM/100ML-% IV SOLN
2.0000 g | Freq: Three times a day (TID) | INTRAVENOUS | Status: DC
  Administered 2018-10-02 – 2018-10-05 (×9): 2 g via INTRAVENOUS
  Filled 2018-10-02 (×12): qty 100

## 2018-10-02 NOTE — Progress Notes (Signed)
  Echocardiogram 2D Echocardiogram has been performed.  Bobbye Charleston 10/02/2018, 2:38 PM

## 2018-10-02 NOTE — Progress Notes (Signed)
Notified by tech pt BP is 89/57 with a temp of 100.6.  Gave tylenol suppository and notified Dr Verlon Au. Per his instruction gave 580ml NS bolus and increased fluids to 1100ml/hr. Will continue to monitor.

## 2018-10-02 NOTE — Consult Note (Addendum)
NAME:  Audrey Collier, MRN:  [id number], DOB:  08/30/1960, LOS: 1 ADMISSION DATE:  10/01/2018, CONSULTATION DATE:  10/02/18 REFERRING MD:  Dr. Verlon Au, CHIEF COMPLAINT: Vomiting, fever   Brief History   58 y/o F admitted 8/1 with fever, vomiting and weakness. Work up concerning for sepsis in the setting of bilateral infiltrates / PNA and GPC bacteremia.    History of present illness   58 y/o F who presented to [facility] on 8/1 after a trip to [facility] with her family with reports of vomiting, fever and weakness.    The patient has a reported cognitive delay at baseline.  Her family reported that she returned home from [facility] on 7/31 and they noted she did not come to breakfast and had stayed in bed.  When they went to check on her she was drowsy and weak.  Additionally, she had dried emesis on her shirt.    Initial ER evaluation noted her to have an acute kidney injury with sr cr of 1.5 (up from 0.8), LDH 471, procalcitonin 30,  lactate 2.5 (cleared to 1.7),  WBC of 16.3 & new thrombocytopenia with platelets of 68 (down from 318 on last recorded labs).  CXR on admit notable for multifocal opacities and low lung volumes.  She was treated with IVF and empiric antibiotics.  COVID screening negative x2.  She was admitted for further evaluation.  Pan cultures obtained.  Blood cultures returned with GPC's in 2/2 bottles with BCID demonstrating MSSA.  Urine culture pending.   PCCM consulted for evaluation 8/2 of sepsis.   Past Medical History  Developmental Delay  GERD Anxiety  Hypothyroidism  HLD  Post menopausal bleeding   Significant Hospital Events   8/01 Admit with fever, vomiting and weakness  Consults:  PCCM   Procedures:     Significant Diagnostic Tests:    Micro Data:  BCx2 8/1 >> gram positive cocci >>  BCID 8/1 >> MSSA  UC 8/1 >>  COVID 8/1 >> negative  COVID 8/2 >> negative   Antimicrobials:  Ancef 8/2 >>   Interim history/subjective:  RN reports pt sleepy but  awakens to voice, remains on IVF.  Notes blistering on right arm, right side and pressure injury on right ear.    Objective   Blood pressure (!) 93/54, pulse 100, temperature 100.2 F (37.9 C), temperature source Axillary, resp. rate (!) 22, height 5\' 5"  (1.651 m), weight 77.7 kg, last menstrual period 04/16/2013, SpO2 93 %.    FiO2 (%):  [2 %] 2 %   Intake/Output Summary (Last 24 hours) at 10/02/2018 1114 Last data filed at 10/02/2018 0025 Gross per 24 hour  Intake 1100 ml  Output -  Net 1100 ml   Filed Weights   10/01/18 1728 10/02/18 0239 10/02/18 0300  Weight: 76.7 kg 77.7 kg 77.7 kg    Examination: General: ill appearing adult female lying in bed in NAD  HEENT: MM pink/dry, crusting on right eye / cleaned with warm cloth  Neuro: awakens to voice, mumbles but appropriate CV: s1s2 regular, SR on monitor, no m/r/g PULM:  Even/non-labored, lungs bilaterally coarse  GI: soft, bsx4 active  Extremities: warm/dry, no edema  Skin: small area of blistering on medial aspect of right arm, on right side and area of dark ecchymosis on right ear (appears as if she laid in the same position??)  Resolved Hospital Problem list     Assessment & Plan:   Severe Sepsis secondary to Bilateral Infiltrates /  PNA and GPC Bacteremia  P: Continue Ancef  Follow blood cultures to maturity  Control fever  Trend PCT, WBC IVF resuscitation  If change in hemodynamics, consider transfer to ICU Plan to repeat blood cultures 8/3 or 8/4 to ensure clearance of bacteremia May need TEE if cultures do not clear. No murmur on exam.   Bilateral Infiltrates in setting of MSSA Bacteremia  -on 2L/[place] -suspect PNA as primary source P: ABX as above  Follow intermittent CXR  Pulmonary hygiene - IS, mobilize as able   AKI  -secondary to sepsis/bacteremia P: Trend BMP / urinary output Replace electrolytes as indicated Avoid nephrotoxic agents, ensure adequate renal perfusion NS ato 200 ml/hr Add CK to labs  to r/o rhabdomyolysis   Thrombocytopenia  -in setting of MSSA bacteremia, sepsis.  Suspect acute phase reactant.   P: Consider transition to SQ Heparin and monitor platelets while on therapy  Trend CBC / monitor for bleeding   Elevated LFT's  -suspect secondary to sepsis / soft BP, lactate cleared P: Trend LFT's  Hypothyroidism  P: Synthroid per primary  R Ear Bruising vs Pressure Injury Blistering on R Arm, Abdomen -? If she laid in one position / pressure injury  P: Follow, skin protective dressings   Best practice:  Diet: As tolerated with aspiration precautions  Pain/Anxiety/Delirium protocol (if indicated): n/a  VAP protocol (if indicated): n/a  DVT prophylaxis: per primary  GI prophylaxis: per primary  Glucose control: n/a  Mobility: as tolerated  Code Status: Full Code  Family Communication: Per primary  Disposition: Progressive Care   Labs   CBC: Recent Labs  Lab 10/01/18 1820 10/02/18 0809  WBC 16.3* 12.7*  NEUTROABS 15.1* 11.5*  HGB 12.9 10.5*  HCT 41.0 32.1*  MCV 88.0 86.8  PLT 68* 57*    Basic Metabolic Panel: Recent Labs  Lab 10/01/18 1820 10/02/18 0809  NA 140 141  K 3.5 3.6  CL 104 107  CO2 25 23  GLUCOSE 138* 135*  BUN 24* 26*  CREATININE 1.52* 1.55*  CALCIUM 8.7* 7.7*   GFR: Estimated Creatinine Clearance: 41.3 mL/min (A) (by C-G formula based on SCr of 1.55 mg/dL (H)). Recent Labs  Lab 10/01/18 1820 10/01/18 2044 10/02/18 0809  PROCALCITON 30.36  --  32.80  WBC 16.3*  --  12.7*  LATICACIDVEN 2.5* 1.7 1.2    Liver Function Tests: Recent Labs  Lab 10/01/18 1820 10/02/18 0809  AST 58* 77*  ALT 37 49*  ALKPHOS 102 65  BILITOT 1.7* 0.7  PROT 6.9 5.4*  ALBUMIN 3.5 2.4*   No results for input(s): LIPASE, AMYLASE in the last 168 hours. No results for input(s): AMMONIA in the last 168 hours.  ABG No results found for: PHART, PCO2ART, PO2ART, HCO3, TCO2, ACIDBASEDEF, O2SAT   Coagulation Profile: Recent Labs  Lab  10/01/18 1820  INR 1.4*    Cardiac Enzymes: No results for input(s): CKTOTAL, CKMB, CKMBINDEX, TROPONINI in the last 168 hours.  HbA1C: No results found for: HGBA1C  CBG: Recent Labs  Lab 10/02/18 0304 10/02/18 0628 10/02/18 0734  GLUCAP 137* 118* 124*    Review of Systems:   Information obtained from staff at bedside & prior medical documentation.   Past Medical History  She,  has a past medical history of Anxiety, Dizziness, Endometrial polyp (03/26/2014), Fecal occult blood test positive (03/15/2013), GERD (gastroesophageal reflux disease), Hypothyroidism, Mental disorder, PMB (postmenopausal bleeding) (03/26/2014), and Thickened endometrium (03/26/2014).   Surgical History    Past Surgical History:  Procedure Laterality Date  . CATARACT EXTRACTION W/PHACO Left 08/06/2017   Procedure: CATARACT EXTRACTION PHACO AND INTRAOCULAR LENS PLACEMENT LEFT EYE;  Surgeon: Fabio PierceWrzosek, James, MD;  Location: [facility];  Service: Ophthalmology;  Laterality: Left;  CDE: 12.63  . CATARACT EXTRACTION W/PHACO Right 10/01/2017   Procedure: CATARACT EXTRACTION PHACO AND INTRAOCULAR LENS PLACEMENT (IOC);  Surgeon: Fabio PierceWrzosek, James, MD;  Location: [facility];  Service: Ophthalmology;  Laterality: Right;  CDE: 5.77  . COLONOSCOPY N/A 12/01/2013   Dr. Fields:moderate sized internal hemorrhoids/left colon is redundant  . DILITATION & CURRETTAGE/HYSTROSCOPY WITH NOVASURE ABLATION N/A 04/18/2014   Procedure: DILATATION & CURETTAGE/HYSTEROSCOPY WITH NOVASURE ENDOMETRIAL ABLATION;  Surgeon: Lazaro ArmsLuther H Eure, MD;  Location: [facility];  Service: Gynecology;  Laterality: N/A;  Uterine Cavity Length 5cm     . ESOPHAGOGASTRODUODENOSCOPY N/A 12/01/2013   ZOX:WRUESLF:mild non erosive gastritis. +H.pylori   . NO PAST SURGERIES    . POLYPECTOMY N/A 04/18/2014   Procedure: ENDOMETRIAL POLYPECTOMY;  Surgeon: Lazaro ArmsLuther H Eure, MD;  Location: [facility];  Service: Gynecology;  Laterality: N/A;     Social History   reports that she has never smoked. She  has never used smokeless tobacco. She reports that she does not drink alcohol or use drugs.   Family History   Her family history includes Diabetes in her maternal aunt and maternal uncle; Hypertension in her maternal aunt. There is no history of Colon cancer.   Allergies No Known Allergies   Home Medications  Prior to Admission medications   Medication Sig Start Date End Date Taking? Authorizing Provider  Calcium Citrate-Vitamin D (CALCIUM + D PO) Take 1 tablet by mouth daily.   Yes [provider]  cetirizine (ZYRTEC) 10 MG tablet Take 10 mg by mouth daily.   Yes [provider]  diclofenac sodium (VOLTAREN) 1 % GEL Apply 1 application topically every 6 (six) hours as needed (heel pain).   Yes [provider]  docusate sodium (STOOL SOFTENER) 100 MG capsule Take 100 mg by mouth every Monday, Wednesday, and Friday.   Yes [provider]  FLUoxetine (PROZAC) 10 MG tablet Take 10 mg by mouth at bedtime.    Yes [provider]  fluticasone (FLONASE) 50 MCG/ACT nasal spray Place 1 spray into both nostrils 2 (two) times daily as needed for allergies.    Yes [provider]  levothyroxine (SYNTHROID) 125 MCG tablet Take 125 mcg by mouth daily before breakfast.    Yes [provider]  montelukast (SINGULAIR) 10 MG tablet Take 10 mg by mouth at bedtime.   Yes [provider]  Multiple Vitamin (MULTIVITAMIN) tablet Take 1 tablet by mouth daily.   Yes [provider]  omeprazole (PRILOSEC) 20 MG capsule TAKE 1 CAPSULE BY MOUTH ONCE DAILY. 07/23/15  Yes Gelene MinkBoone, Anna W, NP  rosuvastatin (CRESTOR) 5 MG tablet Take 5 mg by mouth daily.   Yes [provider]     Critical care time: 30 minutes    Canary BrimBrandi Ollis, NP-C [facility] Pgr: [phone] or if no answer [phone] 10/02/2018, 1:24 PM

## 2018-10-02 NOTE — Progress Notes (Signed)
PHARMACY - PHYSICIAN COMMUNICATION CRITICAL VALUE ALERT - BLOOD CULTURE IDENTIFICATION (BCID)  Audrey Collier is an 58 y.o. female who presented to [facility] on 10/01/2018  Assessment:  Concern for COVID d/t recent travel; neg x 2. High fevers, D-dimer 2/2 blood cx positive for MSSA  Name of physician (or Provider) Contacted: Dr Verlon Au  Current antibiotics: Ceftriaxone, Azithromycin  Changes to prescribed antibiotics recommended:  DC Ceftriaxone Azithro Cefazolin 2 g q8 Pending ID consult  Results for orders placed or performed during the hospital encounter of 10/01/18  Blood Culture ID Panel (Reflexed) (Collected: 10/01/2018  6:28 PM)  Result Value Ref Range   Enterococcus species NOT DETECTED NOT DETECTED   Listeria monocytogenes NOT DETECTED NOT DETECTED   Staphylococcus species DETECTED (A) NOT DETECTED   Staphylococcus aureus (BCID) DETECTED (A) NOT DETECTED   Methicillin resistance NOT DETECTED NOT DETECTED   Streptococcus species NOT DETECTED NOT DETECTED   Streptococcus agalactiae NOT DETECTED NOT DETECTED   Streptococcus pneumoniae NOT DETECTED NOT DETECTED   Streptococcus pyogenes NOT DETECTED NOT DETECTED   Acinetobacter baumannii NOT DETECTED NOT DETECTED   Enterobacteriaceae species NOT DETECTED NOT DETECTED   Enterobacter cloacae complex NOT DETECTED NOT DETECTED   Escherichia coli NOT DETECTED NOT DETECTED   Klebsiella oxytoca NOT DETECTED NOT DETECTED   Klebsiella pneumoniae NOT DETECTED NOT DETECTED   Proteus species NOT DETECTED NOT DETECTED   Serratia marcescens NOT DETECTED NOT DETECTED   Haemophilus influenzae NOT DETECTED NOT DETECTED   Neisseria meningitidis NOT DETECTED NOT DETECTED   Pseudomonas aeruginosa NOT DETECTED NOT DETECTED   Candida albicans NOT DETECTED NOT DETECTED   Candida glabrata NOT DETECTED NOT DETECTED   Candida krusei NOT DETECTED NOT DETECTED   Candida parapsilosis NOT DETECTED NOT DETECTED   Candida tropicalis NOT DETECTED NOT  DETECTED   Levester Fresh, PharmD, BCPS, BCCCP Clinical Pharmacist [phone]  Please check AMION for all [place] numbers  10/02/2018 10:42 AM

## 2018-10-02 NOTE — Progress Notes (Signed)
Audrey Collier called wanting to know what doctors found out about pt. Informed would pass request to am nurse for doctors to give a call and update on pt status. That family could also call around 11 am to see if any updates were discussed Family was fine with solution.

## 2018-10-02 NOTE — Progress Notes (Signed)
MD paged to make aware Covid-19 results showed negative.  Lab called with critical results of blue bottle & red bottle positive-Gram + cocci

## 2018-10-02 NOTE — Progress Notes (Signed)
PROGRESS NOTE  Audrey Collier EVO:[id number] DOB: [birth year] DOA: 10/01/2018 PCP: Jani Gravel, MD  Brief History   58 year old morbidly obese white lady Mild cognitive deficit Bipolar Postmenopausal bleeding status post work-up with endometrial ablation-pathology benign secretory changes 2016 Intractable constipation [facility]reflux Recent cataract surgery 09/2017 right eye  Recent vacation in [place]-found to be sick lethargic subsequently T-max 103.8-brought to emergency room T-max 101.8 pulse 95 respiration 32 WBC 16 CXR = pneumonia bilaterally Hypotensive on admission Coronavirus testing negative x2 CRP 35 procalcitonin 30.3 dimer 20 fibrinogen 771  Started on azithromycin and ceftriaxone to cover community-acquired pneumonia-IV fluids, bolus  A & P  Severe sepsis secondary to MSSA possible DIC? Acute respiratory failure Drop in platelets, patient febrile, hypotensive Changing to high-dose Ancef per pharmacy Volume resuscitation-given 500 cc bolus this morning and change rate to 150 an hour Chest x-ray this morning shows pretty much the same picture as on admission with crowding of intercostal spaces bilaterally Oxygen requirements slightly down from 4 to 2 L Cannot give Lovenox therefore arixtra for now--high risk VTE--hold checking dopplers--if more coherent in am switch to SCD Person under investigation for coronavirus 19 Labs confounding with possible DIC Tested x2 Discussed with Dr. Loleta Books at the [facility] do not feel that patient has COVID especially with pneumonia and MSSA in the blood We will continue to monitor and keep under contact and airborne for now however would de-escalate in the next day or so if she makes improvement on antibiotics alone Cognitive defects, operates that 2nd-3rd grade level Bipolar All meds on hold unable to give that she is sleepy when more awake resume Prozac 10 nightly  Hypothyroidism Continue levothyroxine 125 daily ?  Fall Bruising on right ear right flank right hip See below exam but no concerns for fractures   DVT prophylaxis: Angiomax at this time Code Status: Full confirmed this with family member who I spoke to as below Family Communication: Spoke with [name] sister-in-law Patient got "sick over night"  The next day she had a fever and she wasn't getting up--she has been living with [name] and husband --they have been living together 19 yrs She can usually walk and do things on her own--she is coherent, she can go to RR, she has help 2 hrs a day who give her a  Nurse, children's and cooks supper. She is like a child and maybe processes things a 2-3rd grade level Disposition Plan: inpatient keep on progressive-I used full PPE including Capr and other gloves and masking for initial evaluation but subsequently I do not think she will need this going forward   Verneita Griffes, MD [facility] 7:37 AM  10/02/2018, 7:37 AM  LOS: 1 day   Consultants  . Critical care consulted for opinion  Procedures  . nad  Antibiotics  . Ancef high dose  Interval History/Subjective  Arouses but minimally Seems improved according to nursing Cannot give much of a history but can tell me that she is in [place]  Objective   Vitals:  Vitals:   10/02/18 0629 10/02/18 0725  BP:  (!) 95/50  Pulse:  (!) 104  Resp:    Temp: 99.6 F (37.6 C) (!) 100.6 F (38.1 C)  SpO2:  95%    Exam:  EOMI NCAT No icterus no pallor flat affect dry mucosa no thyromegaly no lymphadenopathy Bruise to the right ear around the antihelix Some bruising to the right flank Patient has good range of motion to  the right hip Abdomen is obese nontender no rebound no guarding She has no lower extremity edema S1-S2 tachycardic Neurologically follows commands but then falls probably asleep   I have personally reviewed the following:   Today's Data  . As below  Lab Data  . Lactic acid  1.2 CRP up to 48 . D-dimer above 20 . Ferritin 220 . Procalcitonin 32.8 . White count down from 16-->12.7 . Hemoglobin down 12.9-->10.5 . Platelet down from 68-57 baseline 300s   Micro Data  . MSSA growing in the blood  Imaging  . Chest x-ray compared to prior 1 shows less blunting of right costophrenic angle although patchy opacities remain in the right lung field  Cardiology Data  . Sinus, sinus tach  Other Data  .   Scheduled Meds: . calcium-vitamin D  1 tablet Oral Q breakfast  . [START ON 10/03/2018] docusate sodium  100 mg Oral Q M,W,F  . FLUoxetine  10 mg Oral QHS  . insulin aspart  0-9 Units Subcutaneous Q4H  . levothyroxine  125 mcg Oral QAC breakfast  . loratadine  10 mg Oral Daily  . montelukast  10 mg Oral QHS  . multivitamin with minerals  1 tablet Oral Daily  . pantoprazole  40 mg Oral Daily  . rosuvastatin  5 mg Oral Daily   Continuous Infusions: . sodium chloride    .  ceFAZolin (ANCEF) IV      Active Problems:   Sepsis due to pneumonia ([name])   LOS: 1 day   How to contact the [facility] Attending or Consulting provider 7A - 7P or covering provider during after hours 7P -7A, for this patient?  1. Check the care team in [facility] and look for a) attending/consulting [facility] provider listed and b) the [facility] listed 2. Log into [email or web address] and use [place] universal password to access. If you do not have the password, please contact the hospital operator. 3. Locate the [facility] provider you are looking for under [facility] and page to a number that you can be directly reached. 4. If you still have difficulty reaching the provider, please page the [facility] (Director on Call) for the Hospitalists listed on amion for assistance.

## 2018-10-02 NOTE — Consult Note (Addendum)
[facility]    Date of Admission:  10/01/2018   Total days of antibiotics: 1 ceftriaxone/azithro --> ancef               Reason for Consult: Staph aureus bacteremia    Referring Provider: CHAMP   Assessment: Staph aureus bacteremia  AKI Dehydration Pneumonia blistering rash Cognitive delay   Plan: 1. Continue ancef 2. Repeat BCx 3. Check TEE if able, TTE for now.  4. agree with stop COVID isolation   Comment Her BCx was reported as aerococcus but BCID resulted as MSSA.  Await result from 2nd set Cx.  Suspect her blisters are from being down at her living place. She does not appear to have TSS.  Could this all be pneumonia?  Thank you so much for this interesting consult, Appreciate the outstanding care provided by pharmacy, CCM and TRH  Active Problems:   Sepsis due to pneumonia ([name])    calcium-vitamin D  1 tablet Oral Q breakfast   [START ON 10/03/2018] docusate sodium  100 mg Oral Q M,W,F   FLUoxetine  10 mg Oral QHS   insulin aspart  0-9 Units Subcutaneous Q4H   levothyroxine  75 mcg Intravenous Daily   loratadine  10 mg Oral Daily   montelukast  10 mg Oral QHS   multivitamin with minerals  1 tablet Oral Daily   pantoprazole (PROTONIX) IV  40 mg Intravenous Q12H    HPI: Audrey Collier is a 58 y.o. female with hx of cognitive delay, adm on 8-1 wit mental status change x 24h, fever (103.8), emesis.  She is non-verbal. In ED she was found to have temp 101.8, hypotension, WBC 16.3, lactate 2.5, and CXR with bilateral pna. COVID (-) x 2.  BP improved with fluid bolus.  She was started on azithro/ceftriaxone until today found to have BCx MSSA. Now changed to ancef.  Temps improving today. BP still low. Os sats on 2L 93-94%.  CXR improving.   Review of Systems: Review of Systems  Unable to perform ROS: Mental acuity  Please see HPI. All other systems reviewed and negative.   Past Medical History:  Diagnosis  Date   Anxiety    Dizziness    Endometrial polyp 03/26/2014   Fecal occult blood test positive 03/15/2013   will send 3 cards home and refer to GI for colonoscopy   GERD (gastroesophageal reflux disease)    Hypothyroidism    Mental disorder    mentally challenged   PMB (postmenopausal bleeding) 03/26/2014   Thickened endometrium 03/26/2014   ?polyp will get HSG    Social History   Tobacco Use   Smoking status: Never Smoker   Smokeless tobacco: Never Used   Tobacco comment: Never smoked  Substance Use Topics   Alcohol use: No    Alcohol/week: 0.0 standard drinks   Drug use: No    Family History  Problem Relation Age of Onset   Diabetes Maternal Aunt    Hypertension Maternal Aunt    Diabetes Maternal Uncle    Colon cancer Neg Hx      Medications:  Scheduled:  calcium-vitamin D  1 tablet Oral Q breakfast   [START ON 10/03/2018] docusate sodium  100 mg Oral Q M,W,F   FLUoxetine  10 mg Oral QHS   insulin aspart  0-9 Units Subcutaneous Q4H   levothyroxine  75 mcg Intravenous Daily   loratadine  10 mg Oral Daily  montelukast  10 mg Oral QHS   multivitamin with minerals  1 tablet Oral Daily   pantoprazole (PROTONIX) IV  40 mg Intravenous Q12H    Abtx:  Anti-infectives (From admission, onward)   Start     Dose/Rate Route Frequency Ordered Stop   10/02/18 1200  ceFAZolin (ANCEF) IVPB 2g/100 mL premix     2 g 200 mL/hr over 30 Minutes Intravenous Every 8 hours 10/02/18 1040     10/01/18 2000  cefTRIAXone (ROCEPHIN) 2 g in sodium chloride 0.9 % 100 mL IVPB  Status:  Discontinued     2 g 200 mL/hr over 30 Minutes Intravenous Every 24 hours 10/01/18 1958 10/02/18 1040   10/01/18 2000  azithromycin (ZITHROMAX) 500 mg in sodium chloride 0.9 % 250 mL IVPB  Status:  Discontinued     500 mg 250 mL/hr over 60 Minutes Intravenous Every 24 hours 10/01/18 1958 10/02/18 1040        OBJECTIVE: Blood pressure (!) 91/59, pulse 100, temperature 98.8 F  (37.1 C), temperature source Oral, resp. rate (!) 22, height 5\' 5"  (1.651 m), weight 77.7 kg, last menstrual period 04/16/2013, SpO2 93 %.  Physical Exam HENT:     Mouth/Throat:     Pharynx: No oropharyngeal exudate.  Eyes:     Extraocular Movements: Extraocular movements intact.     Pupils: Pupils are equal, round, and reactive to light.  Neck:     Musculoskeletal: Normal range of motion and neck supple.  Cardiovascular:     Rate and Rhythm: Normal rate and regular rhythm.     Heart sounds: No murmur.  Pulmonary:     Effort: Pulmonary effort is normal.     Breath sounds: Decreased air movement present.  Abdominal:     General: Bowel sounds are normal. There is no distension.     Palpations: Abdomen is soft.     Tenderness: There is no abdominal tenderness.  Musculoskeletal:     Right lower leg: No edema.     Left lower leg: No edema.  Skin:      Neurological:     Mental Status: She is alert.     Lab Results Results for orders placed or performed during the hospital encounter of 10/01/18 (from the past 48 hour(s))  Lactic acid, plasma     Status: Abnormal   Collection Time: 10/01/18  6:20 PM  Result Value Ref Range   Lactic Acid, Venous 2.5 (HH) 0.5 - 1.9 mmol/L    Comment: CRITICAL RESULT CALLED TO, READ BACK BY AND VERIFIED WITH: EASTER,T AT 1859 ON 10/01/2018 BY JPM Performed at [facility], [address] [place], [place] [address]   Comprehensive metabolic panel     Status: Abnormal   Collection Time: 10/01/18  6:20 PM  Result Value Ref Range   Sodium 140 135 - 145 mmol/L   Potassium 3.5 3.5 - 5.1 mmol/L   Chloride 104 98 - 111 mmol/L   CO2 25 22 - 32 mmol/L   Glucose, Bld 138 (H) 70 - 99 mg/dL   BUN 24 (H) 6 - 20 mg/dL   Creatinine, Ser 6.041.52 (H) 0.44 - 1.00 mg/dL   Calcium 8.7 (L) 8.9 - 10.3 mg/dL   Total Protein 6.9 6.5 - 8.1 g/dL   Albumin 3.5 3.5 - 5.0 g/dL   AST 58 (H) 15 - 41 U/L   ALT 37 0 - 44 U/L   Alkaline Phosphatase 102 38 - 126 U/L   Total  Bilirubin 1.7 (H)  0.3 - 1.2 mg/dL   GFR calc non Af Amer 38 (L) >60 mL/min   GFR calc Af Amer 44 (L) >60 mL/min   Anion gap 11 5 - 15    Comment: Performed at [facility], [address], [place], [place] [address]  CBC WITH DIFFERENTIAL     Status: Abnormal   Collection Time: 10/01/18  6:20 PM  Result Value Ref Range   WBC 16.3 (H) 4.0 - 10.5 K/uL   RBC 4.66 3.87 - 5.11 MIL/uL   Hemoglobin 12.9 12.0 - 15.0 g/dL   HCT 52.8 41.3 - 24.4 %   MCV 88.0 80.0 - 100.0 fL   MCH 27.7 26.0 - 34.0 pg   MCHC 31.5 30.0 - 36.0 g/dL   RDW 01.0 27.2 - 53.6 %   Platelets 68 (L) 150 - 400 K/uL    Comment: PLATELET COUNT CONFIRMED BY SMEAR SPECIMEN CHECKED FOR CLOTS    nRBC 0.0 0.0 - 0.2 %   Neutrophils Relative % 93 %   Neutro Abs 15.1 (H) 1.7 - 7.7 K/uL   Lymphocytes Relative 4 %   Lymphs Abs 0.7 0.7 - 4.0 K/uL   Monocytes Relative 2 %   Monocytes Absolute 0.3 0.1 - 1.0 K/uL   Eosinophils Relative 0 %   Eosinophils Absolute 0.0 0.0 - 0.5 K/uL   Basophils Relative 0 %   Basophils Absolute 0.0 0.0 - 0.1 K/uL   Immature Granulocytes 1 %   Abs Immature Granulocytes 0.09 (H) 0.00 - 0.07 K/uL    Comment: Performed at [facility], [address] [place], [place] [address]  APTT     Status: Abnormal   Collection Time: 10/01/18  6:20 PM  Result Value Ref Range   aPTT 55 (H) 24 - 36 seconds    Comment:        IF BASELINE aPTT IS ELEVATED, SUGGEST PATIENT RISK ASSESSMENT BE USED TO DETERMINE APPROPRIATE ANTICOAGULANT THERAPY. Performed at [facility], [address], [place], [place] [address]   Protime-INR     Status: Abnormal   Collection Time: 10/01/18  6:20 PM  Result Value Ref Range   Prothrombin Time 17.3 (H) 11.4 - 15.2 seconds   INR 1.4 (H) 0.8 - 1.2    Comment: (NOTE) INR goal varies based on device and disease states. Performed at [facility], [address], [place], [place] [address]   Blood Culture (routine x 2)     Status: None (Preliminary result)   Collection Time: 10/01/18   6:20 PM   Specimen: BLOOD  Result Value Ref Range   Specimen Description      BLOOD RIGHT ANTECUBITAL Performed at [facility], [address], [place], [place] [address]    Special Requests      BOTTLES DRAWN AEROBIC AND ANAEROBIC Blood Culture adequate volume Performed at [facility], [address] [place], [place] [address]    Culture  Setup Time      GRAM POSITIVE COCCI Gram Stain Report Called to,Read Back By and Verified With: PERRY,C. AT 0516 ON 10/02/2018 BY EVA ANAEROBIC BOTTLE ONLY Performed at [facility] GRAM POSITIVE COCCI RESULT PREVIOUSLY CALLED  AERO BOTTLE Performed at [facility], [address], [place], [place] [address]    Culture GRAM POSITIVE COCCI    Report Status PENDING   Procalcitonin     Status: None   Collection Time: 10/01/18  6:20 PM  Result Value Ref Range   Procalcitonin 30.36 ng/mL  Comment:        Interpretation: PCT >= 10 ng/mL: Important systemic inflammatory response, almost exclusively due to severe bacterial sepsis or septic shock. (NOTE)       Sepsis PCT Algorithm           Lower Respiratory Tract                                      Infection PCT Algorithm    ----------------------------     ----------------------------         PCT < 0.25 ng/mL                PCT < 0.10 ng/mL         Strongly encourage             Strongly discourage   discontinuation of antibiotics    initiation of antibiotics    ----------------------------     -----------------------------       PCT 0.25 - 0.50 ng/mL            PCT 0.10 - 0.25 ng/mL               OR       >80% decrease in PCT            Discourage initiation of                                            antibiotics      Encourage discontinuation           of antibiotics    ----------------------------     -----------------------------         PCT >= 0.50 ng/mL              PCT 0.26 - 0.50 ng/mL                AND       <80% decrease in PCT             Encourage  initiation of                                             antibiotics       Encourage continuation           of antibiotics    ----------------------------     -----------------------------        PCT >= 0.50 ng/mL                  PCT > 0.50 ng/mL               AND         increase in PCT                  Strongly encourage                                      initiation of antibiotics    Strongly encourage escalation           of antibiotics                                     -----------------------------  PCT <= 0.25 ng/mL                                                 OR                                        > 80% decrease in PCT                                     Discontinue / Do not initiate                                             antibiotics Performed at [facility], [address], [place], [place] [address]   Lactate dehydrogenase     Status: Abnormal   Collection Time: 10/01/18  6:20 PM  Result Value Ref Range   LDH 471 (H) 98 - 192 U/L    Comment: Performed at [facility], [address] [place], [place] [address]  Ferritin     Status: None   Collection Time: 10/01/18  6:20 PM  Result Value Ref Range   Ferritin 143 11 - 307 ng/mL    Comment: Performed at [facility], [address], [place], [place] [address]  Triglycerides     Status: None   Collection Time: 10/01/18  6:20 PM  Result Value Ref Range   Triglycerides 134 <150 mg/dL    Comment: Performed at [facility], [address] [place], [place] [address]  Fibrinogen     Status: Abnormal   Collection Time: 10/01/18  6:20 PM  Result Value Ref Range   Fibrinogen 771 (H) 210 - 475 mg/dL    Comment: Performed at [facility], [address], [place], [place] [address]  C-reactive protein     Status: Abnormal   Collection Time: 10/01/18  6:20 PM  Result Value Ref Range   CRP 35.6 (H) <1.0 mg/dL    Comment: RESULTS CONFIRMED BY MANUAL DILUTION Performed at [facility], [address] [place], [place] [address]   D-dimer, quantitative     Status: Abnormal   Collection Time: 10/01/18  6:20 PM  Result Value Ref Range   D-Dimer, Quant >20.00 (H) 0.00 - 0.50 ug/mL-FEU    Comment: (NOTE) At the manufacturer cut-off of 0.50 ug/mL FEU, this assay has been documented to exclude PE with a sensitivity and negative predictive value of 97 to 99%.  At this time, this assay has not been approved by the FDA to exclude DVT/VTE. Results should be correlated with clinical presentation. Performed at [facility], [address], [place], [place] [address]   Blood Culture (routine x 2)     Status: None (Preliminary result)   Collection Time: 10/01/18  6:28 PM   Specimen: BLOOD  Result Value Ref Range   Specimen Description BLOOD LEFT ANTECUBITAL    Special Requests      BOTTLES DRAWN AEROBIC AND ANAEROBIC Blood Culture adequate volume   Culture  Setup Time      GRAM POSITIVE COCCI Gram Stain Report Called to,Read Back By  and Verified With: PERRY,C. AT 0516 ON 10/02/2018 BY EVA AEROCOCCUS SPECIES Performed at [facility] Organism ID to follow CRITICAL RESULT CALLED TO, READ BACK BY AND VERIFIED WITH: Hollie BeachHARMD M MACCIA [phone] MLM Performed at [facility], [address] [place], [place] [address]    Culture GRAM POSITIVE COCCI    Report Status PENDING   Blood Culture ID Panel (Reflexed)     Status: Abnormal   Collection Time: 10/01/18  6:28 PM  Result Value Ref Range   Enterococcus species NOT DETECTED NOT DETECTED   Listeria monocytogenes NOT DETECTED NOT DETECTED   Staphylococcus species DETECTED (A) NOT DETECTED    Comment: CRITICAL RESULT CALLED TO, READ BACK BY AND VERIFIED WITH: PHARMD M MACCIA [phone] MLM    Staphylococcus aureus (BCID) DETECTED (A) NOT DETECTED    Comment: Methicillin (oxacillin) susceptible Staphylococcus aureus (MSSA). Preferred therapy is anti staphylococcal beta lactam antibiotic (Cefazolin or Nafcillin),  unless clinically contraindicated. CRITICAL RESULT CALLED TO, READ BACK BY AND VERIFIED WITH: PHARMD M MACCIA [phone] MLM    Methicillin resistance NOT DETECTED NOT DETECTED   Streptococcus species NOT DETECTED NOT DETECTED   Streptococcus agalactiae NOT DETECTED NOT DETECTED   Streptococcus pneumoniae NOT DETECTED NOT DETECTED   Streptococcus pyogenes NOT DETECTED NOT DETECTED   Acinetobacter baumannii NOT DETECTED NOT DETECTED   Enterobacteriaceae species NOT DETECTED NOT DETECTED   Enterobacter cloacae complex NOT DETECTED NOT DETECTED   Escherichia coli NOT DETECTED NOT DETECTED   Klebsiella oxytoca NOT DETECTED NOT DETECTED   Klebsiella pneumoniae NOT DETECTED NOT DETECTED   Proteus species NOT DETECTED NOT DETECTED   Serratia marcescens NOT DETECTED NOT DETECTED   Haemophilus influenzae NOT DETECTED NOT DETECTED   Neisseria meningitidis NOT DETECTED NOT DETECTED   Pseudomonas aeruginosa NOT DETECTED NOT DETECTED   Candida albicans NOT DETECTED NOT DETECTED   Candida glabrata NOT DETECTED NOT DETECTED   Candida krusei NOT DETECTED NOT DETECTED   Candida parapsilosis NOT DETECTED NOT DETECTED   Candida tropicalis NOT DETECTED NOT DETECTED    Comment: Performed at [facility], [address] [place], [place] [address]  Urinalysis, Routine w reflex microscopic     Status: Abnormal   Collection Time: 10/01/18  6:34 PM  Result Value Ref Range   Color, Urine AMBER (A) YELLOW    Comment: BIOCHEMICALS MAY BE AFFECTED BY COLOR   APPearance CLOUDY (A) CLEAR   Specific Gravity, Urine 1.019 1.005 - 1.030   pH 5.0 5.0 - 8.0   Glucose, UA NEGATIVE NEGATIVE mg/dL   Hgb urine dipstick MODERATE (A) NEGATIVE   Bilirubin Urine NEGATIVE NEGATIVE   Ketones, ur NEGATIVE NEGATIVE mg/dL   Protein, ur 010100 (A) NEGATIVE mg/dL   Nitrite NEGATIVE NEGATIVE   Leukocytes,Ua NEGATIVE NEGATIVE   RBC / HPF 0-5 0 - 5 RBC/hpf   WBC, UA 11-20 0 - 5 WBC/hpf   Bacteria, UA FEW (A) NONE SEEN    Squamous Epithelial / LPF 0-5 0 - 5   Mucus PRESENT    Amorphous Crystal PRESENT     Comment: Performed at [facility], [address] [place], [place] [address]  SARS Coronavirus 2 [facility](Hospital order, Performed in [facility] hospital lab) Nasopharyngeal Nasopharyngeal Swab     Status: None   Collection Time: 10/01/18  6:34 PM   Specimen: Nasopharyngeal Swab  Result Value Ref Range   SARS Coronavirus 2 NEGATIVE NEGATIVE    Comment: (NOTE) If result is NEGATIVE  SARS-CoV-2 target nucleic acids are NOT DETECTED. The SARS-CoV-2 RNA is generally detectable in upper and lower  respiratory specimens during the acute phase of infection. The lowest  concentration of SARS-CoV-2 viral copies this assay can detect is 250  copies / mL. A negative result does not preclude SARS-CoV-2 infection  and should not be used as the sole basis for treatment or other  patient management decisions.  A negative result may occur with  improper specimen collection / handling, submission of specimen other  than nasopharyngeal swab, presence of viral mutation(s) within the  areas targeted by this assay, and inadequate number of viral copies  (<250 copies / mL). A negative result must be combined with clinical  observations, patient history, and epidemiological information. If result is POSITIVE SARS-CoV-2 target nucleic acids are DETECTED. The SARS-CoV-2 RNA is generally detectable in upper and lower  respiratory specimens dur ing the acute phase of infection.  Positive  results are indicative of active infection with SARS-CoV-2.  Clinical  correlation with patient history and other diagnostic information is  necessary to determine patient infection status.  Positive results do  not rule out bacterial infection or co-infection with other viruses. If result is PRESUMPTIVE POSTIVE SARS-CoV-2 nucleic acids MAY BE PRESENT.   A presumptive positive result was obtained on the submitted specimen  and confirmed on repeat  testing.  While 2019 novel coronavirus  (SARS-CoV-2) nucleic acids may be present in the submitted sample  additional confirmatory testing may be necessary for epidemiological  and / or clinical management purposes  to differentiate between  SARS-CoV-2 and other Sarbecovirus currently known to infect humans.  If clinically indicated additional testing with an alternate test  methodology [phone]) is advised. The SARS-CoV-2 RNA is generally  detectable in upper and lower respiratory sp ecimens during the acute  phase of infection. The expected result is Negative. Fact Sheet for Patients:  BoilerBrush.com.cy Fact Sheet for Healthcare Providers: https://pope.com/ This test is not yet approved or cleared by the [place] FDA and has been authorized for detection and/or diagnosis of SARS-CoV-2 by FDA under an Emergency Use Authorization (EUA).  This EUA will remain in effect (meaning this test can be used) for the duration of the COVID-19 declaration under Section 564(b)(1) of the Act, 21 U.S.C. section 360bbb-3(b)(1), unless the authorization is terminated or revoked sooner. Performed at [facility], [address], [place], [place] [address]   Pregnancy, urine     Status: None   Collection Time: 10/01/18  6:34 PM  Result Value Ref Range   Preg Test, Ur NEGATIVE NEGATIVE    Comment:        THE SENSITIVITY OF THIS METHODOLOGY IS >20 mIU/mL. Performed at [facility], [address], [place], [place] [address]   Lactic acid, plasma     Status: None   Collection Time: 10/01/18  8:44 PM  Result Value Ref Range   Lactic Acid, Venous 1.7 0.5 - 1.9 mmol/L    Comment: Performed at [facility], [address] [place], [place] [address]  SARS Coronavirus 2 [facility] order, Performed in [facility] hospital lab) Nasopharyngeal     Status: None   Collection Time: 10/02/18  3:00 AM   Specimen: Nasopharyngeal  Result Value Ref Range   SARS  Coronavirus 2 NEGATIVE NEGATIVE    Comment: (NOTE) If result is NEGATIVE SARS-CoV-2 target nucleic acids are NOT DETECTED. The SARS-CoV-2 RNA is generally detectable in upper and lower  respiratory specimens during the acute phase of  infection. The lowest  concentration of SARS-CoV-2 viral copies this assay can detect is 250  copies / mL. A negative result does not preclude SARS-CoV-2 infection  and should not be used as the sole basis for treatment or other  patient management decisions.  A negative result may occur with  improper specimen collection / handling, submission of specimen other  than nasopharyngeal swab, presence of viral mutation(s) within the  areas targeted by this assay, and inadequate number of viral copies  (<250 copies / mL). A negative result must be combined with clinical  observations, patient history, and epidemiological information. If result is POSITIVE SARS-CoV-2 target nucleic acids are DETECTED. The SARS-CoV-2 RNA is generally detectable in upper and lower  respiratory specimens dur ing the acute phase of infection.  Positive  results are indicative of active infection with SARS-CoV-2.  Clinical  correlation with patient history and other diagnostic information is  necessary to determine patient infection status.  Positive results do  not rule out bacterial infection or co-infection with other viruses. If result is PRESUMPTIVE POSTIVE SARS-CoV-2 nucleic acids MAY BE PRESENT.   A presumptive positive result was obtained on the submitted specimen  and confirmed on repeat testing.  While 2019 novel coronavirus  (SARS-CoV-2) nucleic acids may be present in the submitted sample  additional confirmatory testing may be necessary for epidemiological  and / or clinical management purposes  to differentiate between  SARS-CoV-2 and other Sarbecovirus currently known to infect humans.  If clinically indicated additional testing with an alternate test  methodology  [phone]) is advised. The SARS-CoV-2 RNA is generally  detectable in upper and lower respiratory sp ecimens during the acute  phase of infection. The expected result is Negative. Fact Sheet for Patients:  BoilerBrush.com.cy Fact Sheet for Healthcare Providers: https://pope.com/ This test is not yet approved or cleared by the [place] FDA and has been authorized for detection and/or diagnosis of SARS-CoV-2 by FDA under an Emergency Use Authorization (EUA).  This EUA will remain in effect (meaning this test can be used) for the duration of the COVID-19 declaration under Section 564(b)(1) of the Act, 21 U.S.C. section 360bbb-3(b)(1), unless the authorization is terminated or revoked sooner. Performed at [facility], [address] [place], [place] [address]   MRSA PCR Screening     Status: None   Collection Time: 10/02/18  3:00 AM   Specimen: Nasopharyngeal  Result Value Ref Range   MRSA by PCR NEGATIVE NEGATIVE    Comment:        The GeneXpert MRSA Assay (FDA approved for NASAL specimens only), is one component of a comprehensive MRSA colonization surveillance program. It is not intended to diagnose MRSA infection nor to guide or monitor treatment for MRSA infections. Performed at [facility], [address] [place], [place] [address]   Glucose, capillary     Status: Abnormal   Collection Time: 10/02/18  3:04 AM  Result Value Ref Range   Glucose-Capillary 137 (H) 70 - 99 mg/dL  Glucose, capillary     Status: Abnormal   Collection Time: 10/02/18  6:28 AM  Result Value Ref Range   Glucose-Capillary 118 (H) 70 - 99 mg/dL  Glucose, capillary     Status: Abnormal   Collection Time: 10/02/18  7:34 AM  Result Value Ref Range   Glucose-Capillary 124 (H) 70 - 99 mg/dL  CBC with Differential/Platelet     Status: Abnormal   Collection Time: 10/02/18  8:09 AM  Result  Value Ref Range   WBC 12.7 (H) 4.0 - 10.5 K/uL     RBC 3.70 (L) 3.87 - 5.11 MIL/uL   Hemoglobin 10.5 (L) 12.0 - 15.0 g/dL   HCT 16.1 (L) 09.6 - 04.5 %   MCV 86.8 80.0 - 100.0 fL   MCH 28.4 26.0 - 34.0 pg   MCHC 32.7 30.0 - 36.0 g/dL   RDW 40.9 81.1 - 91.4 %   Platelets 57 (L) 150 - 400 K/uL    Comment: REPEATED TO VERIFY PLATELET COUNT CONFIRMED BY SMEAR SPECIMEN CHECKED FOR CLOTS Immature Platelet Fraction may be clinically indicated, consider ordering this additional test [id number]    nRBC 0.0 0.0 - 0.2 %   Neutrophils Relative % 91 %   Neutro Abs 11.5 (H) 1.7 - 7.7 K/uL   Lymphocytes Relative 5 %   Lymphs Abs 0.7 0.7 - 4.0 K/uL   Monocytes Relative 3 %   Monocytes Absolute 0.4 0.1 - 1.0 K/uL   Eosinophils Relative 0 %   Eosinophils Absolute 0.0 0.0 - 0.5 K/uL   Basophils Relative 0 %   Basophils Absolute 0.0 0.0 - 0.1 K/uL   Immature Granulocytes 1 %   Abs Immature Granulocytes 0.06 0.00 - 0.07 K/uL    Comment: Performed at [facility], [address] [place], [place] [address]  Comprehensive metabolic panel     Status: Abnormal   Collection Time: 10/02/18  8:09 AM  Result Value Ref Range   Sodium 141 135 - 145 mmol/L   Potassium 3.6 3.5 - 5.1 mmol/L   Chloride 107 98 - 111 mmol/L   CO2 23 22 - 32 mmol/L   Glucose, Bld 135 (H) 70 - 99 mg/dL   BUN 26 (H) 6 - 20 mg/dL   Creatinine, Ser 7.84 (H) 0.44 - 1.00 mg/dL   Calcium 7.7 (L) 8.9 - 10.3 mg/dL   Total Protein 5.4 (L) 6.5 - 8.1 g/dL   Albumin 2.4 (L) 3.5 - 5.0 g/dL   AST 77 (H) 15 - 41 U/L   ALT 49 (H) 0 - 44 U/L   Alkaline Phosphatase 65 38 - 126 U/L   Total Bilirubin 0.7 0.3 - 1.2 mg/dL   GFR calc non Af Amer 37 (L) >60 mL/min   GFR calc Af Amer 43 (L) >60 mL/min   Anion gap 11 5 - 15    Comment: Performed at [facility], [address], [place], [place] [address]  ABO/Rh     Status: None   Collection Time: 10/02/18  8:09 AM  Result Value Ref Range   ABO/RH(D)      O POS Performed at [facility], [address] [place], [place]  [address]   C-reactive protein     Status: Abnormal   Collection Time: 10/02/18  8:09 AM  Result Value Ref Range   CRP 43.8 (H) <1.0 mg/dL    Comment: Performed at [facility], [address], [place], [place] [address]  D-dimer, quantitative (not at [facility])     Status: Abnormal   Collection Time: 10/02/18  8:09 AM  Result Value Ref Range   D-Dimer, Quant >20.00 (H) 0.00 - 0.50 ug/mL-FEU    Comment: REPEATED TO VERIFY CRITICAL RESULT CALLED TO, READ BACK BY AND VERIFIED WITH: K.HUTCHINS,RN @ 1006 10/02/2018 WEBBERJ (NOTE) At the manufacturer cut-off of 0.50 ug/mL FEU, this assay has been documented to exclude PE with a sensitivity and negative predictive value of 97 to  99%.  At this time, this assay has not been approved by the FDA to exclude DVT/VTE. Results should be correlated with clinical presentation. Performed at [facility], [address], [place], [place] [address]   Ferritin     Status: None   Collection Time: 10/02/18  8:09 AM  Result Value Ref Range   Ferritin 220 11 - 307 ng/mL    Comment: Performed at [facility], [address] [place], [place] [address]  Lactate dehydrogenase     Status: Abnormal   Collection Time: 10/02/18  8:09 AM  Result Value Ref Range   LDH 389 (H) 98 - 192 U/L    Comment: Performed at [facility], [address] [place], [place] [address]  Lactic acid, plasma     Status: None   Collection Time: 10/02/18  8:09 AM  Result Value Ref Range   Lactic Acid, Venous 1.2 0.5 - 1.9 mmol/L    Comment: Performed at [facility], [address] [place], [place] [address]  Procalcitonin - Baseline     Status: None   Collection Time: 10/02/18  8:09 AM  Result Value Ref Range   Procalcitonin 32.80 ng/mL    Comment:        Interpretation: PCT >= 10 ng/mL: Important systemic inflammatory response, almost exclusively due to severe bacterial sepsis or septic shock. (NOTE)       Sepsis PCT Algorithm           Lower  Respiratory Tract                                      Infection PCT Algorithm    ----------------------------     ----------------------------         PCT < 0.25 ng/mL                PCT < 0.10 ng/mL         Strongly encourage             Strongly discourage   discontinuation of antibiotics    initiation of antibiotics    ----------------------------     -----------------------------       PCT 0.25 - 0.50 ng/mL            PCT 0.10 - 0.25 ng/mL               OR       >80% decrease in PCT            Discourage initiation of                                            antibiotics      Encourage discontinuation           of antibiotics    ----------------------------     -----------------------------         PCT >= 0.50 ng/mL              PCT 0.26 - 0.50 ng/mL                AND       <80% decrease in PCT             Encourage initiation of  antibiotics       Encourage continuation           of antibiotics    ----------------------------     -----------------------------        PCT >= 0.50 ng/mL                  PCT > 0.50 ng/mL               AND         increase in PCT                  Strongly encourage                                      initiation of antibiotics    Strongly encourage escalation           of antibiotics                                     -----------------------------                                           PCT <= 0.25 ng/mL                                                 OR                                        > 80% decrease in PCT                                     Discontinue / Do not initiate                                             antibiotics Performed at [facility], [address] [place], [place] [address]   Lactic acid, plasma     Status: None   Collection Time: 10/02/18 10:28 AM  Result Value Ref Range   Lactic Acid, Venous 0.9 0.5 - 1.9 mmol/L    Comment: Performed at [facility]  Lab, [address], [place], [place] [address]  Glucose, capillary     Status: Abnormal   Collection Time: 10/02/18 11:24 AM  Result Value Ref Range   Glucose-Capillary 107 (H) 70 - 99 mg/dL      Component Value Date/Time   SDES BLOOD LEFT ANTECUBITAL 10/01/2018 1828   SPECREQUEST  10/01/2018 1828    BOTTLES DRAWN AEROBIC AND ANAEROBIC Blood Culture adequate volume   CULT GRAM POSITIVE COCCI 10/01/2018 1828   REPTSTATUS PENDING 10/01/2018 1828   Dg Chest 1 View  Result Date: 10/02/2018 CLINICAL DATA:  Pneumonia EXAM: CHEST  1 VIEW COMPARISON:  October 01, 2018 FINDINGS: The heart size and mediastinal contours  are stable. Mild hazy opacities are identified in both lung bases improved compared prior exam. There is no pleural effusion or pulmonary edema. The visualized skeletal structures are stable. IMPRESSION: Mild hazy opacity identified in both lung bases, improved compared prior exam. Electronically Signed   By: Sherian Rein M.D.   On: 10/02/2018 08:36   Dg Chest Port 1 View  Result Date: 10/01/2018 CLINICAL DATA:  Hypoxia, fever, emesis today. pt from a home and just returned from [facility] yesterday and family reported patient was fine when she went to bed. History of GERD. EXAM: PORTABLE CHEST 1 VIEW COMPARISON:  None. FINDINGS: Hazy airspace opacities are noted bilaterally accentuated by low lung volumes. No pleural effusion or pneumothorax. Cardiac silhouette is normal in size.  No mediastinal hilar masses. Skeletal structures are grossly intact. IMPRESSION: Bilateral hazy airspace lung opacities consistent multifocal pneumonia. Electronically Signed   By: Amie Portland M.D.   On: 10/01/2018 18:58   Recent Results (from the past 240 hour(s))  Blood Culture (routine x 2)     Status: None (Preliminary result)   Collection Time: 10/01/18  6:20 PM   Specimen: BLOOD  Result Value Ref Range Status   Specimen Description   Final    BLOOD RIGHT ANTECUBITAL Performed at [facility], [address] [place], [place] [address]    Special Requests   Final    BOTTLES DRAWN AEROBIC AND ANAEROBIC Blood Culture adequate volume Performed at [facility], [address] [place], [place] [address]    Culture  Setup Time   Final    GRAM POSITIVE COCCI Gram Stain Report Called to,Read Back By and Verified With: PERRY,C. AT 0516 ON 10/02/2018 BY EVA ANAEROBIC BOTTLE ONLY Performed at [facility] GRAM POSITIVE COCCI RESULT PREVIOUSLY CALLED  AERO BOTTLE Performed at [facility], [address] [place], [place] [address]    Culture [facility] POSITIVE COCCI  Final   Report Status PENDING  Incomplete  Blood Culture (routine x 2)     Status: None (Preliminary result)   Collection Time: 10/01/18  6:28 PM   Specimen: BLOOD  Result Value Ref Range Status   Specimen Description BLOOD LEFT ANTECUBITAL  Final   Special Requests   Final    BOTTLES DRAWN AEROBIC AND ANAEROBIC Blood Culture adequate volume   Culture  Setup Time   Final    GRAM POSITIVE COCCI Gram Stain Report Called to,Read Back By and Verified With: PERRY,C. AT 0516 ON 10/02/2018 BY EVA AEROCOCCUS SPECIES Performed at [facility] Organism ID to follow CRITICAL RESULT CALLED TO, READ BACK BY AND VERIFIED WITH: Hollie Beach [phone] MLM Performed at [facility], [address] [place], [place] [address]    Culture GRAM POSITIVE COCCI  Final   Report Status PENDING  Incomplete  Blood Culture ID Panel (Reflexed)     Status: Abnormal   Collection Time: 10/01/18  6:28 PM  Result Value Ref Range Status   Enterococcus species NOT DETECTED NOT DETECTED Final   Listeria monocytogenes NOT DETECTED NOT DETECTED Final   Staphylococcus species DETECTED (A) NOT DETECTED Final    Comment: CRITICAL RESULT CALLED TO, READ BACK BY AND VERIFIED WITH: PHARMD M MACCIA [id number] MLM    Staphylococcus aureus (BCID) DETECTED (A) NOT DETECTED Final    Comment: Methicillin (oxacillin) susceptible  Staphylococcus aureus (MSSA). Preferred therapy is anti staphylococcal beta lactam antibiotic (Cefazolin or Nafcillin), unless clinically contraindicated. CRITICAL RESULT  CALLED TO, READ BACK BY AND VERIFIED WITH: PHARMD M MACCIA [phone] MLM    Methicillin resistance NOT DETECTED NOT DETECTED Final   Streptococcus species NOT DETECTED NOT DETECTED Final   Streptococcus agalactiae NOT DETECTED NOT DETECTED Final   Streptococcus pneumoniae NOT DETECTED NOT DETECTED Final   Streptococcus pyogenes NOT DETECTED NOT DETECTED Final   Acinetobacter baumannii NOT DETECTED NOT DETECTED Final   Enterobacteriaceae species NOT DETECTED NOT DETECTED Final   Enterobacter cloacae complex NOT DETECTED NOT DETECTED Final   Escherichia coli NOT DETECTED NOT DETECTED Final   Klebsiella oxytoca NOT DETECTED NOT DETECTED Final   Klebsiella pneumoniae NOT DETECTED NOT DETECTED Final   Proteus species NOT DETECTED NOT DETECTED Final   Serratia marcescens NOT DETECTED NOT DETECTED Final   Haemophilus influenzae NOT DETECTED NOT DETECTED Final   Neisseria meningitidis NOT DETECTED NOT DETECTED Final   Pseudomonas aeruginosa NOT DETECTED NOT DETECTED Final   Candida albicans NOT DETECTED NOT DETECTED Final   Candida glabrata NOT DETECTED NOT DETECTED Final   Candida krusei NOT DETECTED NOT DETECTED Final   Candida parapsilosis NOT DETECTED NOT DETECTED Final   Candida tropicalis NOT DETECTED NOT DETECTED Final    Comment: Performed at [facility], [place] [address], [place], [place] [address]  SARS Coronavirus 2 [facility] order, Performed in [facility] hospital lab) Nasopharyngeal Nasopharyngeal Swab     Status: None   Collection Time: 10/01/18  6:34 PM   Specimen: Nasopharyngeal Swab  Result Value Ref Range Status   SARS Coronavirus 2 NEGATIVE NEGATIVE Final    Comment: (NOTE) If result is NEGATIVE SARS-CoV-2 target nucleic acids are NOT DETECTED. The SARS-CoV-2 RNA is generally detectable in upper  and lower  respiratory specimens during the acute phase of infection. The lowest  concentration of SARS-CoV-2 viral copies this assay can detect is 250  copies / mL. A negative result does not preclude SARS-CoV-2 infection  and should not be used as the sole basis for treatment or other  patient management decisions.  A negative result may occur with  improper specimen collection / handling, submission of specimen other  than nasopharyngeal swab, presence of viral mutation(s) within the  areas targeted by this assay, and inadequate number of viral copies  (<250 copies / mL). A negative result must be combined with clinical  observations, patient history, and epidemiological information. If result is POSITIVE SARS-CoV-2 target nucleic acids are DETECTED. The SARS-CoV-2 RNA is generally detectable in upper and lower  respiratory specimens dur ing the acute phase of infection.  Positive  results are indicative of active infection with SARS-CoV-2.  Clinical  correlation with patient history and other diagnostic information is  necessary to determine patient infection status.  Positive results do  not rule out bacterial infection or co-infection with other viruses. If result is PRESUMPTIVE POSTIVE SARS-CoV-2 nucleic acids MAY BE PRESENT.   A presumptive positive result was obtained on the submitted specimen  and confirmed on repeat testing.  While 2019 novel coronavirus  (SARS-CoV-2) nucleic acids may be present in the submitted sample  additional confirmatory testing may be necessary for epidemiological  and / or clinical management purposes  to differentiate between  SARS-CoV-2 and other Sarbecovirus currently known to infect humans.  If clinically indicated additional testing with an alternate test  methodology [phone]) is advised. The SARS-CoV-2 RNA is generally  detectable in upper and lower respiratory sp ecimens during the acute  phase of infection. The expected result is  Negative. Fact Sheet for Patients:  BoilerBrush.com.cy Fact Sheet for Healthcare Providers: https://pope.com/ This test is not yet approved or cleared by the [place] FDA and has been authorized for detection and/or diagnosis of SARS-CoV-2 by FDA under an Emergency Use Authorization (EUA).  This EUA will remain in effect (meaning this test can be used) for the duration of the COVID-19 declaration under Section 564(b)(1) of the Act, 21 U.S.C. section 360bbb-3(b)(1), unless the authorization is terminated or revoked sooner. Performed at [facility], [address], [place], [place] [address]   SARS Coronavirus 2 [facility] order, Performed in [facility] hospital lab) Nasopharyngeal     Status: None   Collection Time: 10/02/18  3:00 AM   Specimen: Nasopharyngeal  Result Value Ref Range Status   SARS Coronavirus 2 NEGATIVE NEGATIVE Final    Comment: (NOTE) If result is NEGATIVE SARS-CoV-2 target nucleic acids are NOT DETECTED. The SARS-CoV-2 RNA is generally detectable in upper and lower  respiratory specimens during the acute phase of infection. The lowest  concentration of SARS-CoV-2 viral copies this assay can detect is 250  copies / mL. A negative result does not preclude SARS-CoV-2 infection  and should not be used as the sole basis for treatment or other  patient management decisions.  A negative result may occur with  improper specimen collection / handling, submission of specimen other  than nasopharyngeal swab, presence of viral mutation(s) within the  areas targeted by this assay, and inadequate number of viral copies  (<250 copies / mL). A negative result must be combined with clinical  observations, patient history, and epidemiological information. If result is POSITIVE SARS-CoV-2 target nucleic acids are DETECTED. The SARS-CoV-2 RNA is generally detectable in upper and lower  respiratory specimens dur ing the acute phase  of infection.  Positive  results are indicative of active infection with SARS-CoV-2.  Clinical  correlation with patient history and other diagnostic information is  necessary to determine patient infection status.  Positive results do  not rule out bacterial infection or co-infection with other viruses. If result is PRESUMPTIVE POSTIVE SARS-CoV-2 nucleic acids MAY BE PRESENT.   A presumptive positive result was obtained on the submitted specimen  and confirmed on repeat testing.  While 2019 novel coronavirus  (SARS-CoV-2) nucleic acids may be present in the submitted sample  additional confirmatory testing may be necessary for epidemiological  and / or clinical management purposes  to differentiate between  SARS-CoV-2 and other Sarbecovirus currently known to infect humans.  If clinically indicated additional testing with an alternate test  methodology [phone]) is advised. The SARS-CoV-2 RNA is generally  detectable in upper and lower respiratory sp ecimens during the acute  phase of infection. The expected result is Negative. Fact Sheet for Patients:  BoilerBrush.com.cy Fact Sheet for Healthcare Providers: https://pope.com/ This test is not yet approved or cleared by the [place] FDA and has been authorized for detection and/or diagnosis of SARS-CoV-2 by FDA under an Emergency Use Authorization (EUA).  This EUA will remain in effect (meaning this test can be used) for the duration of the COVID-19 declaration under Section 564(b)(1) of the Act, 21 U.S.C. section 360bbb-3(b)(1), unless the authorization is terminated or revoked sooner. Performed at [facility], [address], [place], [place] [address]   MRSA PCR Screening     Status: None   Collection Time: 10/02/18  3:00 AM   Specimen: Nasopharyngeal  Result Value Ref Range Status   MRSA by PCR NEGATIVE NEGATIVE Final  Comment:        The GeneXpert MRSA Assay  (FDA approved for NASAL specimens only), is one component of a comprehensive MRSA colonization surveillance program. It is not intended to diagnose MRSA infection nor to guide or monitor treatment for MRSA infections. Performed at [facility], [address] [place], [place] [address]     Microbiology: Recent Results (from the past 240 hour(s))  Blood Culture (routine x 2)     Status: None (Preliminary result)   Collection Time: 10/01/18  6:20 PM   Specimen: BLOOD  Result Value Ref Range Status   Specimen Description   Final    BLOOD RIGHT ANTECUBITAL Performed at [facility], [address] [place], [place] [address]    Special Requests   Final    BOTTLES DRAWN AEROBIC AND ANAEROBIC Blood Culture adequate volume Performed at [facility], [address] [place], [place] [address]    Culture  Setup Time   Final    GRAM POSITIVE COCCI Gram Stain Report Called to,Read Back By and Verified With: PERRY,C. AT 0516 ON 10/02/2018 BY EVA ANAEROBIC BOTTLE ONLY Performed at [facility] GRAM POSITIVE COCCI RESULT PREVIOUSLY CALLED  AERO BOTTLE Performed at [facility], [address] [place], [place] [address]    [facility]  Final   Report Status PENDING  Incomplete  Blood Culture (routine x 2)     Status: None (Preliminary result)   Collection Time: 10/01/18  6:28 PM   Specimen: BLOOD  Result Value Ref Range Status   Specimen Description BLOOD LEFT ANTECUBITAL  Final   Special Requests   Final    BOTTLES DRAWN AEROBIC AND ANAEROBIC Blood Culture adequate volume   Culture  Setup Time   Final    GRAM POSITIVE COCCI Gram Stain Report Called to,Read Back By and Verified With: PERRY,C. AT 0516 ON 10/02/2018 BY EVA AEROCOCCUS SPECIES Performed at [facility] Organism ID to follow CRITICAL RESULT CALLED TO, READ BACK BY AND VERIFIED WITH: Hollie BeachHARMD M MACCIA [phone] MLM Performed at [facility], [address]  [place], [place] [address]    Culture GRAM POSITIVE COCCI  Final   Report Status PENDING  Incomplete  Blood Culture ID Panel (Reflexed)     Status: Abnormal   Collection Time: 10/01/18  6:28 PM  Result Value Ref Range Status   Enterococcus species NOT DETECTED NOT DETECTED Final   Listeria monocytogenes NOT DETECTED NOT DETECTED Final   Staphylococcus species DETECTED (A) NOT DETECTED Final    Comment: CRITICAL RESULT CALLED TO, READ BACK BY AND VERIFIED WITH: PHARMD M MACCIA [phone] MLM    Staphylococcus aureus (BCID) DETECTED (A) NOT DETECTED Final    Comment: Methicillin (oxacillin) susceptible Staphylococcus aureus (MSSA). Preferred therapy is anti staphylococcal beta lactam antibiotic (Cefazolin or Nafcillin), unless clinically contraindicated. CRITICAL RESULT CALLED TO, READ BACK BY AND VERIFIED WITH: PHARMD M MACCIA [phone] MLM    Methicillin resistance NOT DETECTED NOT DETECTED Final   Streptococcus species NOT DETECTED NOT DETECTED Final   Streptococcus agalactiae NOT DETECTED NOT DETECTED Final   Streptococcus pneumoniae NOT DETECTED NOT DETECTED Final   Streptococcus pyogenes NOT DETECTED NOT DETECTED Final   Acinetobacter baumannii NOT DETECTED NOT DETECTED Final   Enterobacteriaceae species NOT DETECTED NOT DETECTED Final   Enterobacter cloacae complex NOT DETECTED NOT DETECTED Final   Escherichia coli NOT DETECTED NOT DETECTED Final   Klebsiella oxytoca NOT  DETECTED NOT DETECTED Final   Klebsiella pneumoniae NOT DETECTED NOT DETECTED Final   Proteus species NOT DETECTED NOT DETECTED Final   Serratia marcescens NOT DETECTED NOT DETECTED Final   Haemophilus influenzae NOT DETECTED NOT DETECTED Final   Neisseria meningitidis NOT DETECTED NOT DETECTED Final   Pseudomonas aeruginosa NOT DETECTED NOT DETECTED Final   Candida albicans NOT DETECTED NOT DETECTED Final   Candida glabrata NOT DETECTED NOT DETECTED Final   Candida krusei NOT DETECTED NOT DETECTED Final    Candida parapsilosis NOT DETECTED NOT DETECTED Final   Candida tropicalis NOT DETECTED NOT DETECTED Final    Comment: Performed at [facility], [address] [place], [place] [address]  SARS Coronavirus 2 [facility] order, Performed in [facility] hospital lab) Nasopharyngeal Nasopharyngeal Swab     Status: None   Collection Time: 10/01/18  6:34 PM   Specimen: Nasopharyngeal Swab  Result Value Ref Range Status   SARS Coronavirus 2 NEGATIVE NEGATIVE Final    Comment: (NOTE) If result is NEGATIVE SARS-CoV-2 target nucleic acids are NOT DETECTED. The SARS-CoV-2 RNA is generally detectable in upper and lower  respiratory specimens during the acute phase of infection. The lowest  concentration of SARS-CoV-2 viral copies this assay can detect is 250  copies / mL. A negative result does not preclude SARS-CoV-2 infection  and should not be used as the sole basis for treatment or other  patient management decisions.  A negative result may occur with  improper specimen collection / handling, submission of specimen other  than nasopharyngeal swab, presence of viral mutation(s) within the  areas targeted by this assay, and inadequate number of viral copies  (<250 copies / mL). A negative result must be combined with clinical  observations, patient history, and epidemiological information. If result is POSITIVE SARS-CoV-2 target nucleic acids are DETECTED. The SARS-CoV-2 RNA is generally detectable in upper and lower  respiratory specimens dur ing the acute phase of infection.  Positive  results are indicative of active infection with SARS-CoV-2.  Clinical  correlation with patient history and other diagnostic information is  necessary to determine patient infection status.  Positive results do  not rule out bacterial infection or co-infection with other viruses. If result is PRESUMPTIVE POSTIVE SARS-CoV-2 nucleic acids MAY BE PRESENT.   A presumptive positive result was obtained on the  submitted specimen  and confirmed on repeat testing.  While 2019 novel coronavirus  (SARS-CoV-2) nucleic acids may be present in the submitted sample  additional confirmatory testing may be necessary for epidemiological  and / or clinical management purposes  to differentiate between  SARS-CoV-2 and other Sarbecovirus currently known to infect humans.  If clinically indicated additional testing with an alternate test  methodology [phone]) is advised. The SARS-CoV-2 RNA is generally  detectable in upper and lower respiratory sp ecimens during the acute  phase of infection. The expected result is Negative. Fact Sheet for Patients:  BoilerBrush[employer].cy Fact Sheet for Healthcare Providers: https://pope.com/ This test is not yet approved or cleared by the [place] FDA and has been authorized for detection and/or diagnosis of SARS-CoV-2 by FDA under an Emergency Use Authorization (EUA).  This EUA will remain in effect (meaning this test can be used) for the duration of the COVID-19 declaration under Section 564(b)(1) of the Act, 21 U.S.C. section 360bbb-3(b)(1), unless the authorization is terminated or revoked sooner. Performed at [facility], [address] [place], [place] [address]   SARS Coronavirus 2 [facility] order, Performed in  [facility] hospital lab) Nasopharyngeal     Status: None   Collection Time: 10/02/18  3:00 AM   Specimen: Nasopharyngeal  Result Value Ref Range Status   SARS Coronavirus 2 NEGATIVE NEGATIVE Final    Comment: (NOTE) If result is NEGATIVE SARS-CoV-2 target nucleic acids are NOT DETECTED. The SARS-CoV-2 RNA is generally detectable in upper and lower  respiratory specimens during the acute phase of infection. The lowest  concentration of SARS-CoV-2 viral copies this assay can detect is 250  copies / mL. A negative result does not preclude SARS-CoV-2 infection  and should not be used as the sole basis for  treatment or other  patient management decisions.  A negative result may occur with  improper specimen collection / handling, submission of specimen other  than nasopharyngeal swab, presence of viral mutation(s) within the  areas targeted by this assay, and inadequate number of viral copies  (<250 copies / mL). A negative result must be combined with clinical  observations, patient history, and epidemiological information. If result is POSITIVE SARS-CoV-2 target nucleic acids are DETECTED. The SARS-CoV-2 RNA is generally detectable in upper and lower  respiratory specimens dur ing the acute phase of infection.  Positive  results are indicative of active infection with SARS-CoV-2.  Clinical  correlation with patient history and other diagnostic information is  necessary to determine patient infection status.  Positive results do  not rule out bacterial infection or co-infection with other viruses. If result is PRESUMPTIVE POSTIVE SARS-CoV-2 nucleic acids MAY BE PRESENT.   A presumptive positive result was obtained on the submitted specimen  and confirmed on repeat testing.  While 2019 novel coronavirus  (SARS-CoV-2) nucleic acids may be present in the submitted sample  additional confirmatory testing may be necessary for epidemiological  and / or clinical management purposes  to differentiate between  SARS-CoV-2 and other Sarbecovirus currently known to infect humans.  If clinically indicated additional testing with an alternate test  methodology [phone]) is advised. The SARS-CoV-2 RNA is generally  detectable in upper and lower respiratory sp ecimens during the acute  phase of infection. The expected result is Negative. Fact Sheet for Patients:  BoilerBrush[employer].cy Fact Sheet for Healthcare Providers: https://pope.com/ This test is not yet approved or cleared by the [place] FDA and has been authorized for detection and/or  diagnosis of SARS-CoV-2 by FDA under an Emergency Use Authorization (EUA).  This EUA will remain in effect (meaning this test can be used) for the duration of the COVID-19 declaration under Section 564(b)(1) of the Act, 21 U.S.C. section 360bbb-3(b)(1), unless the authorization is terminated or revoked sooner. Performed at [facility], [address], [place], [place] [address]   MRSA PCR Screening     Status: None   Collection Time: 10/02/18  3:00 AM   Specimen: Nasopharyngeal  Result Value Ref Range Status   MRSA by PCR NEGATIVE NEGATIVE Final    Comment:        The GeneXpert MRSA Assay (FDA approved for NASAL specimens only), is one component of a comprehensive MRSA colonization surveillance program. It is not intended to diagnose MRSA infection nor to guide or monitor treatment for MRSA infections. Performed at [facility], [address], [place], [place] [address]     Radiographs and labs were personally reviewed by me.        [place] [facility] Staphylococcus aureus bacteremia   Staphylococcus aureus bacteremia (SAB) is associated with a high rate of  complications and mortality.  Specific aspects of clinical management are critical to optimizing the outcome of patients with SAB.  Therefore, the [facility] Antimicrobial Management Team [facility]) has initiated an intervention aimed at improving the management of SAB at [facility].  To do so, Infectious Diseases physicians are providing an evidence-based consult for the management of all patients with SAB.     Yes No Comments  Perform follow-up blood cultures (even if the patient is afebrile) to ensure clearance of bacteremia [x]  []    Remove vascular catheter and obtain follow-up blood cultures after the removal of the catheter []  [x]    Perform echocardiography to evaluate for endocarditis (transthoracic ECHO is 40-50% sensitive, TEE is > 90% sensitive) [x]  []  Please keep in mind, that  neither test can definitively EXCLUDE endocarditis, and that should clinical suspicion remain high for endocarditis the patient should then still be treated with an "endocarditis" duration of therapy = 6 weeks  Consult electrophysiologist to evaluate implanted cardiac device (pacemaker, ICD) []  [x]    Ensure source control [x]  []  Have all abscesses been drained effectively? Have deep seeded infections (septic joints or osteomyelitis) had appropriate surgical debridement?  Investigate for metastatic sites of infection []  [x]  Does the patient have ANY symptom or physical exam finding that would suggest a deeper infection (back or neck pain that may be suggestive of vertebral osteomyelitis or epidural abscess, muscle pain that could be a symptom of pyomyositis)?  Keep in mind that for deep seeded infections MRI imaging with contrast is preferred rather than other often insensitive tests such as plain x-rays, especially early in a patient's presentation.  Change antibiotic therapy to __________________ [x]  []  Beta-lactam antibiotics are preferred for MSSA due to higher cure rates.   If on Vancomycin, goal trough should be 15 - 20 mcg/mL  Estimated duration of IV antibiotic therapy:   []  []  Consult case management for probably prolonged outpatient IV antibiotic therapy     Johny Sax, MD [facility] for Infectious Disease [facility] [phone] 10/02/2018, 11:35 AM

## 2018-10-02 NOTE — ED Notes (Signed)
ED TO INPATIENT HANDOFF REPORT  ED Nurse Name and Phone #: Marisa Hua, RN  S Name/Age/Gender Audrey Collier 58 y.o. female Room/Bed: APA07/APA07  Code Status   Code Status: Not on file  Home/SNF/Other Home Patient oriented to: self and place Is this baseline? Yes   Triage Complete: Triage complete  Chief Complaint possible covid-19  Triage Note EMS reports pt from a home and just returned from [facility] yesterday and family reported patient was fine when she went to bed. Silvio Pate Pruit [phone]) reported to EMS pt did not feel well or eat today and went she went to check on her this evening pt had vomited on herself and had a fever and was non-verbal. IV started by EMS and reports temp of 103.8.    Allergies No Known Allergies  Level of Care/Admitting Diagnosis ED Disposition    ED Disposition Condition Comment   Admit  Hospital Area: [facility] [[id number]]  Level of Care: Progressive [102]  Covid Evaluation: Person Under Investigation (PUI)  Diagnosis: Sepsis due to pneumonia [facility]) [[id number]]  Admitting Physician: Lilyan Gilford [[id number]]  Attending Physician: Lilyan Gilford [[id number]]  Estimated length of stay: 3 - 4 days  Certification:: I certify this patient will need inpatient services for at least 2 midnights  PT Class (Do Not Modify): Inpatient [101]  PT Acc Code (Do Not Modify): Private [1]       B Medical/Surgery History Past Medical History:  Diagnosis Date  . Anxiety   . Dizziness   . Endometrial polyp 03/26/2014  . Fecal occult blood test positive 03/15/2013   will send 3 cards home and refer to GI for colonoscopy  . GERD (gastroesophageal reflux disease)   . Hypothyroidism   . Mental disorder    mentally challenged  . PMB (postmenopausal bleeding) 03/26/2014  . Thickened endometrium 03/26/2014   ?polyp will get HSG   Past Surgical History:  Procedure Laterality Date  . CATARACT EXTRACTION W/PHACO Left 08/06/2017    Procedure: CATARACT EXTRACTION PHACO AND INTRAOCULAR LENS PLACEMENT LEFT EYE;  Surgeon: Fabio Pierce, MD;  Location: [facility];  Service: Ophthalmology;  Laterality: Left;  CDE: 12.63  . CATARACT EXTRACTION W/PHACO Right 10/01/2017   Procedure: CATARACT EXTRACTION PHACO AND INTRAOCULAR LENS PLACEMENT (IOC);  Surgeon: Fabio Pierce, MD;  Location: [facility];  Service: Ophthalmology;  Laterality: Right;  CDE: 5.77  . COLONOSCOPY N/A 12/01/2013   Dr. Fields:moderate sized internal hemorrhoids/left colon is redundant  . DILITATION & CURRETTAGE/HYSTROSCOPY WITH NOVASURE ABLATION N/A 04/18/2014   Procedure: DILATATION & CURETTAGE/HYSTEROSCOPY WITH NOVASURE ENDOMETRIAL ABLATION;  Surgeon: Lazaro Arms, MD;  Location: [facility];  Service: Gynecology;  Laterality: N/A;  Uterine Cavity Length 5cm     . ESOPHAGOGASTRODUODENOSCOPY N/A 12/01/2013   MWU:XLKG non erosive gastritis. +H.pylori   . NO PAST SURGERIES    . POLYPECTOMY N/A 04/18/2014   Procedure: ENDOMETRIAL POLYPECTOMY;  Surgeon: Lazaro Arms, MD;  Location: [facility];  Service: Gynecology;  Laterality: N/A;     A IV Location/Drains/Wounds Patient Lines/Drains/Airways Status   Active Line/Drains/Airways    Name:   Placement date:   Placement time:   Site:   Days:   Peripheral IV 10/01/18 Left Antecubital   10/01/18    1709    Antecubital   1   Peripheral IV 10/01/18 Right Wrist   10/01/18    1820    Wrist   1   Urethral Catheter Erven Colla, RN 14 Fr.   10/02/18  2951    -   less than 1   Incision (Closed) 04/18/14 Vagina Other (Comment)   04/18/14    0803     1628   Incision (Closed) 08/06/17 Eye Left   08/06/17    1141     422   Incision (Closed) 10/01/17 Eye Right   10/01/17    1158     366          Intake/Output Last 24 hours  Intake/Output Summary (Last 24 hours) at 10/02/2018 0104 Last data filed at 10/02/2018 0025 Gross per 24 hour  Intake 1100 ml  Output -  Net 1100 ml    Labs/Imaging Results for orders placed or performed during the  hospital encounter of 10/01/18 (from the past 48 hour(s))  Lactic acid, plasma     Status: Abnormal   Collection Time: 10/01/18  6:20 PM  Result Value Ref Range   Lactic Acid, Venous 2.5 (HH) 0.5 - 1.9 mmol/L    Comment: CRITICAL RESULT CALLED TO, READ BACK BY AND VERIFIED WITH: EASTER,T AT 1859 ON 10/01/2018 BY JPM Performed at [facility], [address], [place], [place] [address]   Comprehensive metabolic panel     Status: Abnormal   Collection Time: 10/01/18  6:20 PM  Result Value Ref Range   Sodium 140 135 - 145 mmol/L   Potassium 3.5 3.5 - 5.1 mmol/L   Chloride 104 98 - 111 mmol/L   CO2 25 22 - 32 mmol/L   Glucose, Bld 138 (H) 70 - 99 mg/dL   BUN 24 (H) 6 - 20 mg/dL   Creatinine, Ser 1.52 (H) 0.44 - 1.00 mg/dL   Calcium 8.7 (L) 8.9 - 10.3 mg/dL   Total Protein 6.9 6.5 - 8.1 g/dL   Albumin 3.5 3.5 - 5.0 g/dL   AST 58 (H) 15 - 41 U/L   ALT 37 0 - 44 U/L   Alkaline Phosphatase 102 38 - 126 U/L   Total Bilirubin 1.7 (H) 0.3 - 1.2 mg/dL   GFR calc non Af Amer 38 (L) >60 mL/min   GFR calc Af Amer 44 (L) >60 mL/min   Anion gap 11 5 - 15    Comment: Performed at [facility], [address], [place], [place] [address]  CBC WITH DIFFERENTIAL     Status: Abnormal   Collection Time: 10/01/18  6:20 PM  Result Value Ref Range   WBC 16.3 (H) 4.0 - 10.5 K/uL   RBC 4.66 3.87 - 5.11 MIL/uL   Hemoglobin 12.9 12.0 - 15.0 g/dL   HCT 41.0 36.0 - 46.0 %   MCV 88.0 80.0 - 100.0 fL   MCH 27.7 26.0 - 34.0 pg   MCHC 31.5 30.0 - 36.0 g/dL   RDW 13.8 11.5 - 15.5 %   Platelets 68 (L) 150 - 400 K/uL    Comment: PLATELET COUNT CONFIRMED BY SMEAR SPECIMEN CHECKED FOR CLOTS    nRBC 0.0 0.0 - 0.2 %   Neutrophils Relative % 93 %   Neutro Abs 15.1 (H) 1.7 - 7.7 K/uL   Lymphocytes Relative 4 %   Lymphs Abs 0.7 0.7 - 4.0 K/uL   Monocytes Relative 2 %   Monocytes Absolute 0.3 0.1 - 1.0 K/uL   Eosinophils Relative 0 %   Eosinophils Absolute 0.0 0.0 - 0.5 K/uL   Basophils Relative 0 %   Basophils  Absolute 0.0 0.0 - 0.1 K/uL   Immature Granulocytes 1 %   Abs Immature Granulocytes 0.09 (H)  0.00 - 0.07 K/uL    Comment: Performed at [facility], [address] [place], [place] [address]  APTT     Status: Abnormal   Collection Time: 10/01/18  6:20 PM  Result Value Ref Range   aPTT 55 (H) 24 - 36 seconds    Comment:        IF BASELINE aPTT IS ELEVATED, SUGGEST PATIENT RISK ASSESSMENT BE USED TO DETERMINE APPROPRIATE ANTICOAGULANT THERAPY. Performed at [facility], [address], [place], [place] [address]   Protime-INR     Status: Abnormal   Collection Time: 10/01/18  6:20 PM  Result Value Ref Range   Prothrombin Time 17.3 (H) 11.4 - 15.2 seconds   INR 1.4 (H) 0.8 - 1.2    Comment: (NOTE) INR goal varies based on device and disease states. Performed at [facility], [address], [place], [place] [address]   Procalcitonin     Status: None   Collection Time: 10/01/18  6:20 PM  Result Value Ref Range   Procalcitonin 30.36 ng/mL    Comment:        Interpretation: PCT >= 10 ng/mL: Important systemic inflammatory response, almost exclusively due to Collier bacterial sepsis or septic shock. (NOTE)       Sepsis PCT Algorithm           Lower Respiratory Tract                                      Infection PCT Algorithm    ----------------------------     ----------------------------         PCT < 0.25 ng/mL                PCT < 0.10 ng/mL         Strongly encourage             Strongly discourage   discontinuation of antibiotics    initiation of antibiotics    ----------------------------     -----------------------------       PCT 0.25 - 0.50 ng/mL            PCT 0.10 - 0.25 ng/mL               OR       >80% decrease in PCT            Discourage initiation of                                            antibiotics      Encourage discontinuation           of antibiotics    ----------------------------     -----------------------------         PCT >= 0.50 ng/mL               PCT 0.26 - 0.50 ng/mL                AND       <80% decrease in PCT             Encourage initiation of  antibiotics       Encourage continuation           of antibiotics    ----------------------------     -----------------------------        PCT >= 0.50 ng/mL                  PCT > 0.50 ng/mL               AND         increase in PCT                  Strongly encourage                                      initiation of antibiotics    Strongly encourage escalation           of antibiotics                                     -----------------------------                                           PCT <= 0.25 ng/mL                                                 OR                                        > 80% decrease in PCT                                     Discontinue / Do not initiate                                             antibiotics Performed at [facility], [address] [place], [place] [address]   Lactate dehydrogenase     Status: Abnormal   Collection Time: 10/01/18  6:20 PM  Result Value Ref Range   LDH 471 (H) 98 - 192 U/L    Comment: Performed at [facility], [address] [place], [place] [address]  Ferritin     Status: None   Collection Time: 10/01/18  6:20 PM  Result Value Ref Range   Ferritin 143 11 - 307 ng/mL    Comment: Performed at [facility], [address], [place], [place] [address]  Triglycerides     Status: None   Collection Time: 10/01/18  6:20 PM  Result Value Ref Range   Triglycerides 134 <150 mg/dL    Comment: Performed at [facility], [address] [place], [place] [address]  Fibrinogen     Status: Abnormal   Collection Time: 10/01/18  6:20 PM  Result Value Ref Range   Fibrinogen 771 (H) 210 - 475  mg/dL    Comment: Performed at [facility], [address] [place], [place] [address]  C-reactive protein     Status: Abnormal   Collection Time: 10/01/18  6:20 PM  Result Value Ref Range   CRP  35.6 (H) <1.0 mg/dL    Comment: RESULTS CONFIRMED BY MANUAL DILUTION Performed at [facility], [address] [place], [place] [address]   D-dimer, quantitative     Status: Abnormal   Collection Time: 10/01/18  6:20 PM  Result Value Ref Range   D-Dimer, Quant >20.00 (H) 0.00 - 0.50 ug/mL-FEU    Comment: (NOTE) At the manufacturer cut-off of 0.50 ug/mL FEU, this assay has been documented to exclude PE with a sensitivity and negative predictive value of 97 to 99%.  At this time, this assay has not been approved by the FDA to exclude DVT/VTE. Results should be correlated with clinical presentation. Performed at [facility], [address], [place], [place] [address]   Urinalysis, Routine w reflex microscopic     Status: Abnormal   Collection Time: 10/01/18  6:34 PM  Result Value Ref Range   Color, Urine AMBER (A) YELLOW    Comment: BIOCHEMICALS MAY BE AFFECTED BY COLOR   APPearance CLOUDY (A) CLEAR   Specific Gravity, Urine 1.019 1.005 - 1.030   pH 5.0 5.0 - 8.0   Glucose, UA NEGATIVE NEGATIVE mg/dL   Hgb urine dipstick MODERATE (A) NEGATIVE   Bilirubin Urine NEGATIVE NEGATIVE   Ketones, ur NEGATIVE NEGATIVE mg/dL   Protein, ur 213 (A) NEGATIVE mg/dL   Nitrite NEGATIVE NEGATIVE   Leukocytes,Ua NEGATIVE NEGATIVE   RBC / HPF 0-5 0 - 5 RBC/hpf   WBC, UA 11-20 0 - 5 WBC/hpf   Bacteria, UA FEW (A) NONE SEEN   Squamous Epithelial / LPF 0-5 0 - 5   Mucus PRESENT    Amorphous Crystal PRESENT     Comment: Performed at [facility], [address], [place], [place] [address]  SARS Coronavirus 2 [facility] order, Performed in [facility] hospital lab) Nasopharyngeal Nasopharyngeal Swab     Status: None   Collection Time: 10/01/18  6:34 PM   Specimen: Nasopharyngeal Swab  Result Value Ref Range   SARS Coronavirus 2 NEGATIVE NEGATIVE    Comment: (NOTE) If result is NEGATIVE SARS-CoV-2 target nucleic acids are NOT DETECTED. The SARS-CoV-2 RNA is generally detectable in upper and lower   respiratory specimens during the acute phase of infection. The lowest  concentration of SARS-CoV-2 viral copies this assay can detect is 250  copies / mL. A negative result does not preclude SARS-CoV-2 infection  and should not be used as the sole basis for treatment or other  patient management decisions.  A negative result may occur with  improper specimen collection / handling, submission of specimen other  than nasopharyngeal swab, presence of viral mutation(s) within the  areas targeted by this assay, and inadequate number of viral copies  (<250 copies / mL). A negative result must be combined with clinical  observations, patient history, and epidemiological information. If result is POSITIVE SARS-CoV-2 target nucleic acids are DETECTED. The SARS-CoV-2 RNA is generally detectable in upper and lower  respiratory specimens dur ing the acute phase of infection.  Positive  results are indicative of active infection with SARS-CoV-2.  Clinical  correlation with patient history and other diagnostic information is  necessary to determine patient infection status.  Positive results do  not rule out bacterial infection or co-infection with other viruses. If  result is PRESUMPTIVE POSTIVE SARS-CoV-2 nucleic acids MAY BE PRESENT.   A presumptive positive result was obtained on the submitted specimen  and confirmed on repeat testing.  While 2019 novel coronavirus  (SARS-CoV-2) nucleic acids may be present in the submitted sample  additional confirmatory testing may be necessary for epidemiological  and / or clinical management purposes  to differentiate between  SARS-CoV-2 and other Sarbecovirus currently known to infect humans.  If clinically indicated additional testing with an alternate test  methodology [phone] is advised. The SARS-CoV-2 RNA is generally  detectable in upper and lower respiratory sp ecimens during the acute  phase of infection. The expected result is Negative. Fact  Sheet for Patients:  BoilerBrush.com.cyhttps://www.fda.gov/media/136312/download Fact Sheet for Healthcare Providers: https://pope.com/https://www.fda.gov/media/136313/download This test is not yet approved or cleared by the [place] FDA and has been authorized for detection and/or diagnosis of SARS-CoV-2 by FDA under an Emergency Use Authorization (EUA).  This EUA will remain in effect (meaning this test can be used) for the duration of the COVID-19 declaration under Section 564(b)(1) of the Act, 21 U.S.C. section 360bbb-3(b)(1), unless the authorization is terminated or revoked sooner. Performed at [facility], [address] [place], [place] [address]   Pregnancy, urine     Status: None   Collection Time: 10/01/18  6:34 PM  Result Value Ref Range   Preg Test, Ur NEGATIVE NEGATIVE    Comment:        THE SENSITIVITY OF THIS METHODOLOGY IS >20 mIU/mL. Performed at [facility], [address] [place], [place] [address]   Lactic acid, plasma     Status: None   Collection Time: 10/01/18  8:44 PM  Result Value Ref Range   Lactic Acid, Venous 1.7 0.5 - 1.9 mmol/L    Comment: Performed at [facility], [address] [place], [place] [address]   Dg Chest Port 1 View  Result Date: 10/01/2018 CLINICAL DATA:  Hypoxia, fever, emesis today. pt from a home and [facility] and family reported patient was fine when she went to bed. History of GERD. EXAM: PORTABLE CHEST 1 VIEW COMPARISON:  None. FINDINGS: Hazy airspace opacities are noted bilaterally accentuated by low lung volumes. No pleural effusion or pneumothorax. Cardiac silhouette is normal in size.  No mediastinal hilar masses. Skeletal structures are grossly intact. IMPRESSION: Bilateral hazy airspace lung opacities consistent multifocal pneumonia. Electronically Signed   By: Amie Portlandavid  Ormond M.D.   On: 10/01/2018 18:58    Pending Labs Unresulted Labs (From admission, onward)    Start     Ordered   10/01/18 1820  Blood Culture (routine x  2)  BLOOD CULTURE X 2,   STAT     10/01/18 1820   10/01/18 1820  Urine culture  ONCE - STAT,   STAT     10/01/18 1820   Signed and Held  SARS Coronavirus 2 [facility](Hospital order, Performed in [facility] hospital lab) Nasopharyngeal Nasopharyngeal Swab  (Novel Coronavirus, [facility](Hospital Order))  Once,   R    Question Answer Comment  Is this test for diagnosis or screening Diagnosis of ill patient   Symptomatic for COVID-19 as defined by CDC Yes   Date of Symptom Onset 10/01/2018   Hospitalized for COVID-19 Yes   Admitted to ICU for COVID-19 No   Previously tested for COVID-19 Yes   Resident in a congregate (group) care setting Yes   Employed in healthcare setting No   Pregnant No  Signed and Held   Signed and Held  HIV antibody (Routine Testing)  Once,   R     Signed and Held   Signed and Held  CBC  (enoxaparin (LOVENOX)    CrCl >/= 30 ml/min)  Once,   R    Comments: Baseline for enoxaparin therapy IF NOT ALREADY DRAWN.  Notify MD if PLT < 100 K.    Signed and Held   Signed and Held  Creatinine, serum  (enoxaparin (LOVENOX)    CrCl >/= 30 ml/min)  Once,   R    Comments: Baseline for enoxaparin therapy IF NOT ALREADY DRAWN.    Signed and Held   Signed and Held  Creatinine, serum  (enoxaparin (LOVENOX)    CrCl >/= 30 ml/min)  Weekly,   R    Comments: while on enoxaparin therapy    Signed and Held   Signed and Held  CBC with Differential/Platelet  Daily,   R     Signed and Held   Signed and Held  Comprehensive metabolic panel  Daily,   R     Signed and Held   Signed and Held  C-reactive protein  Daily,   R     Signed and Held   Signed and Held  SARS Coronavirus 2 (Hospital order, Performed in [facility] hospital lab) Nasopharyngeal Nasopharyngeal Swab  (Novel Coronavirus, [facility](Hospital Order))  Once,   R    Question Answer Comment  Is this test for diagnosis or screening Diagnosis of ill patient   Symptomatic for COVID-19 as defined by CDC Yes   Date of Symptom Onset 10/01/2018    Hospitalized for COVID-19 Yes   Admitted to ICU for COVID-19 No   Previously tested for COVID-19 Yes   Resident in a congregate (group) care setting Yes   Employed in healthcare setting No   Pregnant No      Signed and Held   Signed and Held  HIV antibody (Routine Testing)  Once,   R     Signed and Held   Signed and Held  ABO/Rh  Once,   R     Signed and Held          Vitals/Pain Today's Vitals   10/01/18 2226 10/01/18 2230 10/01/18 2300 10/02/18 0017  BP:  (!) 92/51 (!) 98/59 (!) 92/56  Pulse:  (!) 101 (!) 102 100  Resp:  (!) 23 (!) 34 (!) 26  Temp: (!) 103 F (39.4 C)   (!) 102 F (38.9 C)  TempSrc: Axillary   Axillary  SpO2:  93% 97% 92%  Weight:      Height:        Isolation Precautions Airborne and Contact precautions  Medications Medications  0.9 %  sodium chloride infusion (1,000 mLs Intravenous New Bag/Given 10/01/18 1820)  cefTRIAXone (ROCEPHIN) 2 g in sodium chloride 0.9 % 100 mL IVPB (0 g Intravenous Stopped 10/01/18 2229)  azithromycin (ZITHROMAX) 500 mg in sodium chloride 0.9 % 250 mL IVPB (0 mg Intravenous Stopped 10/01/18 2332)  acetaminophen (TYLENOL) suppository 650 mg (650 mg Rectal Given 10/01/18 2307)  albuterol (VENTOLIN HFA) 108 (90 Base) MCG/ACT inhaler 4 puff (4 puffs Inhalation Given 10/01/18 1831)  ondansetron (ZOFRAN) injection 4 mg (4 mg Intravenous Given 10/01/18 1831)  acetaminophen (TYLENOL) tablet 650 mg (650 mg Oral Given 10/01/18 1831)  lactated ringers bolus 1,000 mL (0 mLs Intravenous Stopped 10/02/18 0025)    And  lactated ringers bolus 1,000 mL (1,000 mLs Intravenous New  Bag/Given 10/01/18 2130)    And  lactated ringers bolus 500 mL (500 mLs Intravenous New Bag/Given 10/01/18 2213)  acetaminophen (TYLENOL) tablet 650 mg (650 mg Oral Given 10/01/18 2108)    Mobility walks with person assist High fall risk   Focused Assessments    R Recommendations: See Admitting Provider Note  Report given to:   Additional Notes:

## 2018-10-02 NOTE — Progress Notes (Signed)
CRITICAL VALUE ALERT  Critical Value:  D dimmer >20  Date & Time Notied: 10/02/18 1012  Provider Notified: Dr Verlon Au  Orders Received/Actions taken:

## 2018-10-03 ENCOUNTER — Inpatient Hospital Stay (HOSPITAL_COMMUNITY): Payer: Medicaid Other

## 2018-10-03 DIAGNOSIS — B9561 Methicillin susceptible Staphylococcus aureus infection as the cause of diseases classified elsewhere: Secondary | ICD-10-CM

## 2018-10-03 DIAGNOSIS — I330 Acute and subacute infective endocarditis: Secondary | ICD-10-CM

## 2018-10-03 DIAGNOSIS — A4101 Sepsis due to Methicillin susceptible Staphylococcus aureus: Secondary | ICD-10-CM

## 2018-10-03 DIAGNOSIS — I358 Other nonrheumatic aortic valve disorders: Secondary | ICD-10-CM

## 2018-10-03 DIAGNOSIS — R7881 Bacteremia: Secondary | ICD-10-CM

## 2018-10-03 LAB — COMPREHENSIVE METABOLIC PANEL
ALT: 37 U/L (ref 0–44)
AST: 57 U/L — ABNORMAL HIGH (ref 15–41)
Albumin: 2 g/dL — ABNORMAL LOW (ref 3.5–5.0)
Alkaline Phosphatase: 53 U/L (ref 38–126)
Anion gap: 5 (ref 5–15)
BUN: 24 mg/dL — ABNORMAL HIGH (ref 6–20)
CO2: 21 mmol/L — ABNORMAL LOW (ref 22–32)
Calcium: 7.5 mg/dL — ABNORMAL LOW (ref 8.9–10.3)
Chloride: 120 mmol/L — ABNORMAL HIGH (ref 98–111)
Creatinine, Ser: 1.34 mg/dL — ABNORMAL HIGH (ref 0.44–1.00)
GFR calc Af Amer: 51 mL/min — ABNORMAL LOW (ref >60)
GFR calc non Af Amer: 44 mL/min — ABNORMAL LOW (ref >60)
Glucose, Bld: 107 mg/dL — ABNORMAL HIGH (ref 70–99)
Potassium: 3.8 mmol/L (ref 3.5–5.1)
Sodium: 146 mmol/L — ABNORMAL HIGH (ref 135–145)
Total Bilirubin: 0.8 mg/dL (ref 0.3–1.2)
Total Protein: 4.9 g/dL — ABNORMAL LOW (ref 6.5–8.1)

## 2018-10-03 LAB — CBC WITH DIFFERENTIAL/PLATELET
Abs Immature Granulocytes: 0.05 10*3/uL (ref 0.00–0.07)
Basophils Absolute: 0 10*3/uL (ref 0.0–0.1)
Basophils Relative: 0 %
Eosinophils Absolute: 0 10*3/uL (ref 0.0–0.5)
Eosinophils Relative: 0 %
HCT: 29.3 % — ABNORMAL LOW (ref 36.0–46.0)
Hemoglobin: 9.2 g/dL — ABNORMAL LOW (ref 12.0–15.0)
Immature Granulocytes: 1 %
Lymphocytes Relative: 10 %
Lymphs Abs: 0.6 10*3/uL — ABNORMAL LOW (ref 0.7–4.0)
MCH: 27.7 pg (ref 26.0–34.0)
MCHC: 31.4 g/dL (ref 30.0–36.0)
MCV: 88.3 fL (ref 80.0–100.0)
Monocytes Absolute: 0.2 10*3/uL (ref 0.1–1.0)
Monocytes Relative: 4 %
Neutro Abs: 4.8 10*3/uL (ref 1.7–7.7)
Neutrophils Relative %: 85 %
Platelets: 56 10*3/uL — ABNORMAL LOW (ref 150–400)
RBC: 3.32 MIL/uL — ABNORMAL LOW (ref 3.87–5.11)
RDW: 14.7 % (ref 11.5–15.5)
WBC: 5.6 10*3/uL (ref 4.0–10.5)
nRBC: 0 % (ref 0.0–0.2)

## 2018-10-03 LAB — GLUCOSE, CAPILLARY
Glucose-Capillary: 101 mg/dL — ABNORMAL HIGH (ref 70–99)
Glucose-Capillary: 107 mg/dL — ABNORMAL HIGH (ref 70–99)
Glucose-Capillary: 129 mg/dL — ABNORMAL HIGH (ref 70–99)
Glucose-Capillary: 88 mg/dL (ref 70–99)
Glucose-Capillary: 90 mg/dL (ref 70–99)
Glucose-Capillary: 97 mg/dL (ref 70–99)
Glucose-Capillary: 98 mg/dL (ref 70–99)

## 2018-10-03 LAB — URINE CULTURE: Culture: NO GROWTH

## 2018-10-03 LAB — HIV ANTIBODY (ROUTINE TESTING W REFLEX): HIV Screen 4th Generation wRfx: NONREACTIVE

## 2018-10-03 LAB — PROCALCITONIN: Procalcitonin: 24.97 ng/mL

## 2018-10-03 MED ORDER — SODIUM CHLORIDE 0.9 % IV SOLN: 1000.0000 mL | INTRAVENOUS | Status: DC

## 2018-10-03 NOTE — Progress Notes (Signed)
Subjective: No new complaints   Antibiotics:  Anti-infectives (From admission, onward)   Start     Dose/Rate Route Frequency Ordered Stop   10/02/18 1200  ceFAZolin (ANCEF) IVPB 2g/100 mL premix     2 g 200 mL/hr over 30 Minutes Intravenous Every 8 hours 10/02/18 1040     10/01/18 2000  cefTRIAXone (ROCEPHIN) 2 g in sodium chloride 0.9 % 100 mL IVPB  Status:  Discontinued     2 g 200 mL/hr over 30 Minutes Intravenous Every 24 hours 10/01/18 1958 10/02/18 1040   10/01/18 2000  azithromycin (ZITHROMAX) 500 mg in sodium chloride 0.9 % 250 mL IVPB  Status:  Discontinued     500 mg 250 mL/hr over 60 Minutes Intravenous Every 24 hours 10/01/18 1958 10/02/18 1040      Medications: Scheduled Meds:  calcium-vitamin D  1 tablet Oral Q breakfast   docusate sodium  100 mg Oral Q M,W,F   FLUoxetine  10 mg Oral QHS   insulin aspart  0-9 Units Subcutaneous Q4H   levothyroxine  75 mcg Intravenous Daily   loratadine  10 mg Oral Daily   montelukast  10 mg Oral QHS   multivitamin with minerals  1 tablet Oral Daily   pantoprazole (PROTONIX) IV  40 mg Intravenous Q12H   Continuous Infusions:  sodium chloride 1,000 mL (10/02/18 2348)    ceFAZolin (ANCEF) IV 2 g (10/03/18 0448)   PRN Meds:.acetaminophen, acetaminophen, traMADol    Objective: Weight change:   Intake/Output Summary (Last 24 hours) at 10/03/2018 1034 Last data filed at 10/03/2018 0600 Gross per 24 hour  Intake 2019 ml  Output 1450 ml  Net 569 ml   Blood pressure (!) 101/59, pulse 95, temperature 98.6 F (37 C), temperature source Oral, resp. rate (!) 38, height 5\' 5"  (1.651 m), weight 77.7 kg, last menstrual period 04/16/2013, SpO2 93 %. Temp:  [98.6 F (37 C)-101.8 F (38.8 C)] 98.6 F (37 C) (08/03 0836) Pulse Rate:  [95-109] 95 (08/03 0836) Resp:  [25-38] 38 (08/03 0836) BP: (91-104)/(55-64) 101/59 (08/03 0836) SpO2:  [89 %-95 %] 93 % (08/03 0836)  Physical Exam: General: Alert and awake,  oriented x3, not in any acute distress. HEENT: anicteric sclera, EOMI CVS regular rate, normal  Chest: , no wheezing, no respiratory distress Abdomen: soft non-distended,  Extremities: no edema or deformity noted bilaterally Skin: no rashes Neuro: nonfocal  CBC:    BMET Recent Labs    10/02/18 0809 10/03/18 0607  NA 141 146*  K 3.6 3.8  CL 107 120*  CO2 23 21*  GLUCOSE 135* 107*  BUN 26* 24*  CREATININE 1.55* 1.34*  CALCIUM 7.7* 7.5*     Liver Panel  Recent Labs    10/02/18 0809 10/03/18 0607  PROT 5.4* 4.9*  ALBUMIN 2.4* 2.0*  AST 77* 57*  ALT 49* 37  ALKPHOS 65 53  BILITOT 0.7 0.8       Sedimentation Rate No results for input(s): ESRSEDRATE in the last 72 hours. C-Reactive Protein Recent Labs    10/01/18 1820 10/02/18 0809  CRP 35.6* 43.8*    Micro Results: Recent Results (from the past 720 hour(s))  Blood Culture (routine x 2)     Status: Abnormal (Preliminary result)   Collection Time: 10/01/18  6:20 PM   Specimen: BLOOD  Result Value Ref Range Status   Specimen Description   Final    BLOOD RIGHT ANTECUBITAL Performed at [facility],  [address] [place], [place] [address]    Special Requests   Final    BOTTLES DRAWN AEROBIC AND ANAEROBIC Blood Culture adequate volume Performed at [facility], [address] [place], [place] [address]    Culture  Setup Time   Final    GRAM POSITIVE COCCI Gram Stain Report Called to,Read Back By and Verified With: PERRY,C. AT 0516 ON 10/02/2018 BY EVA ANAEROBIC BOTTLE ONLY Performed at [facility] GRAM POSITIVE COCCI RESULT PREVIOUSLY CALLED  AERO BOTTLE Performed at [facility], [address] [place], [place] [address]    Culture (A)  Final    STAPHYLOCOCCUS AUREUS SUSCEPTIBILITIES TO FOLLOW Performed at [facility], [address] [place], [place] [address]    Report Status PENDING  Incomplete  Blood Culture (routine x 2)     Status: Abnormal (Preliminary  result)   Collection Time: 10/01/18  6:28 PM   Specimen: BLOOD  Result Value Ref Range Status   Specimen Description BLOOD LEFT ANTECUBITAL  Final   Special Requests   Final    BOTTLES DRAWN AEROBIC AND ANAEROBIC Blood Culture adequate volume   Culture  Setup Time   Final    GRAM POSITIVE COCCI Gram Stain Report Called to,Read Back By and Verified With: PERRY,C. AT 0516 ON 10/02/2018 BY EVA AEROBIC BOTTLE ONLY Performed at [facility] CRITICAL RESULT CALLED TO, READ BACK BY AND VERIFIED WITH: PHARMD M MACCIA [id number] MLM    Culture (A)  Final    STAPHYLOCOCCUS AUREUS SUSCEPTIBILITIES TO FOLLOW Performed at [facility], [address] [place], [place] [address]    Report Status PENDING  Incomplete  Blood Culture ID Panel (Reflexed)     Status: Abnormal   Collection Time: 10/01/18  6:28 PM  Result Value Ref Range Status   Enterococcus species NOT DETECTED NOT DETECTED Final   Listeria monocytogenes NOT DETECTED NOT DETECTED Final   Staphylococcus species DETECTED (A) NOT DETECTED Final    Comment: CRITICAL RESULT CALLED TO, READ BACK BY AND VERIFIED WITH: PHARMD M MACCIA [id number] MLM    Staphylococcus aureus (BCID) DETECTED (A) NOT DETECTED Final    Comment: Methicillin (oxacillin) susceptible Staphylococcus aureus (MSSA). Preferred therapy is anti staphylococcal beta lactam antibiotic (Cefazolin or Nafcillin), unless clinically contraindicated. CRITICAL RESULT CALLED TO, READ BACK BY AND VERIFIED WITH: PHARMD M MACCIA [id number] MLM    Methicillin resistance NOT DETECTED NOT DETECTED Final   Streptococcus species NOT DETECTED NOT DETECTED Final   Streptococcus agalactiae NOT DETECTED NOT DETECTED Final   Streptococcus pneumoniae NOT DETECTED NOT DETECTED Final   Streptococcus pyogenes NOT DETECTED NOT DETECTED Final   Acinetobacter baumannii NOT DETECTED NOT DETECTED Final   Enterobacteriaceae species NOT DETECTED NOT DETECTED Final   Enterobacter cloacae  complex NOT DETECTED NOT DETECTED Final   Escherichia coli NOT DETECTED NOT DETECTED Final   Klebsiella oxytoca NOT DETECTED NOT DETECTED Final   Klebsiella pneumoniae NOT DETECTED NOT DETECTED Final   Proteus species NOT DETECTED NOT DETECTED Final   Serratia marcescens NOT DETECTED NOT DETECTED Final   Haemophilus influenzae NOT DETECTED NOT DETECTED Final   Neisseria meningitidis NOT DETECTED NOT DETECTED Final   Pseudomonas aeruginosa NOT DETECTED NOT DETECTED Final   Candida albicans NOT DETECTED NOT DETECTED Final   Candida glabrata NOT DETECTED NOT DETECTED Final   Candida krusei NOT DETECTED NOT DETECTED Final   Candida parapsilosis NOT DETECTED NOT DETECTED Final   Candida  tropicalis NOT DETECTED NOT DETECTED Final    Comment: Performed at [facility], [address] [place], [place] [address]  Urine culture     Status: None   Collection Time: 10/01/18  6:34 PM   Specimen: In/Out Cath Urine  Result Value Ref Range Status   Specimen Description   Final    IN/OUT CATH URINE Performed at [facility], [address] [place], [place] [address]    Special Requests   Final    NONE Performed at [facility], [address] [place], [place] [address]    Culture   Final    NO GROWTH Performed at [facility], [address] [place], [place] [address]    Report Status 10/03/2018 FINAL  Final  SARS Coronavirus 2 [facility](Hospital order, Performed in [facility] hospital lab) Nasopharyngeal Nasopharyngeal Swab     Status: None   Collection Time: 10/01/18  6:34 PM   Specimen: Nasopharyngeal Swab  Result Value Ref Range Status   SARS Coronavirus 2 NEGATIVE NEGATIVE Final    Comment: (NOTE) If result is NEGATIVE SARS-CoV-2 target nucleic acids are NOT DETECTED. The SARS-CoV-2 RNA is generally detectable in upper and lower  respiratory specimens during the acute phase of infection. The lowest  concentration of SARS-CoV-2 viral copies this assay can detect is 250  copies /  mL. A negative result does not preclude SARS-CoV-2 infection  and should not be used as the sole basis for treatment or other  patient management decisions.  A negative result may occur with  improper specimen collection / handling, submission of specimen other  than nasopharyngeal swab, presence of viral mutation(s) within the  areas targeted by this assay, and inadequate number of viral copies  (<250 copies / mL). A negative result must be combined with clinical  observations, patient history, and epidemiological information. If result is POSITIVE SARS-CoV-2 target nucleic acids are DETECTED. The SARS-CoV-2 RNA is generally detectable in upper and lower  respiratory specimens dur ing the acute phase of infection.  Positive  results are indicative of active infection with SARS-CoV-2.  Clinical  correlation with patient history and other diagnostic information is  necessary to determine patient infection status.  Positive results do  not rule out bacterial infection or co-infection with other viruses. If result is PRESUMPTIVE POSTIVE SARS-CoV-2 nucleic acids MAY BE PRESENT.   A presumptive positive result was obtained on the submitted specimen  and confirmed on repeat testing.  While 2019 novel coronavirus  (SARS-CoV-2) nucleic acids may be present in the submitted sample  additional confirmatory testing may be necessary for epidemiological  and / or clinical management purposes  to differentiate between  SARS-CoV-2 and other Sarbecovirus currently known to infect humans.  If clinically indicated additional testing with an alternate test  methodology [id number]([id number]) is advised. The SARS-CoV-2 RNA is generally  detectable in upper and lower respiratory sp ecimens during the acute  phase of infection. The expected result is Negative. Fact Sheet for Patients:  BoilerBrush.com.cyhttps://www.fda.gov/media/136312/download Fact Sheet for Healthcare Providers: https://pope.com/https://www.fda.gov/media/136313/download This test is  not yet approved or cleared by the [place] FDA and has been authorized for detection and/or diagnosis of SARS-CoV-2 by FDA under an Emergency Use Authorization (EUA).  This EUA will remain in effect (meaning this test can be used) for the duration of the COVID-19 declaration under Section 564(b)(1) of the Act, 21 U.S.C. section 360bbb-3(b)(1), unless the authorization is terminated or revoked sooner. Performed at [facility], [address] [place], [place] [address]   SARS Coronavirus 2 [facility] order, Performed in [facility] hospital lab) Nasopharyngeal     Status: None   Collection Time: 10/02/18  3:00 AM   Specimen: Nasopharyngeal  Result Value Ref Range Status   SARS Coronavirus 2 NEGATIVE NEGATIVE Final    Comment: (NOTE) If result is NEGATIVE SARS-CoV-2 target nucleic acids are NOT DETECTED. The SARS-CoV-2 RNA is generally detectable in upper and lower  respiratory specimens during the acute phase of infection. The lowest  concentration of SARS-CoV-2 viral copies this assay can detect is 250  copies / mL. A negative result does not preclude SARS-CoV-2 infection  and should not be used as the sole basis for treatment or other  patient management decisions.  A negative result may occur with  improper specimen collection / handling, submission of specimen other  than nasopharyngeal swab, presence of viral mutation(s) within the  areas targeted by this assay, and inadequate number of viral copies  (<250 copies / mL). A negative result must be combined with clinical  observations, patient history, and epidemiological information. If result is POSITIVE SARS-CoV-2 target nucleic acids are DETECTED. The SARS-CoV-2 RNA is generally detectable in upper and lower  respiratory specimens dur ing the acute phase of infection.  Positive  results are indicative of active infection with SARS-CoV-2.  Clinical  correlation with patient history and other diagnostic information is    necessary to determine patient infection status.  Positive results do  not rule out bacterial infection or co-infection with other viruses. If result is PRESUMPTIVE POSTIVE SARS-CoV-2 nucleic acids MAY BE PRESENT.   A presumptive positive result was obtained on the submitted specimen  and confirmed on repeat testing.  While 2019 novel coronavirus  (SARS-CoV-2) nucleic acids may be present in the submitted sample  additional confirmatory testing may be necessary for epidemiological  and / or clinical management purposes  to differentiate between  SARS-CoV-2 and other Sarbecovirus currently known to infect humans.  If clinically indicated additional testing with an alternate test  methodology [phone]) is advised. The SARS-CoV-2 RNA is generally  detectable in upper and lower respiratory sp ecimens during the acute  phase of infection. The expected result is Negative. Fact Sheet for Patients:  StrictlyIdeas.no Fact Sheet for Healthcare Providers: BankingDealers.co.za This test is not yet approved or cleared by the [place] FDA and has been authorized for detection and/or diagnosis of SARS-CoV-2 by FDA under an Emergency Use Authorization (EUA).  This EUA will remain in effect (meaning this test can be used) for the duration of the COVID-19 declaration under Section 564(b)(1) of the Act, 21 U.S.C. section 360bbb-3(b)(1), unless the authorization is terminated or revoked sooner. Performed at [facility], [place] [address] [place], [place] [address]   MRSA PCR Screening     Status: None   Collection Time: 10/02/18  3:00 AM   Specimen: Nasopharyngeal  Result Value Ref Range Status   MRSA by PCR NEGATIVE NEGATIVE Final    Comment:        The GeneXpert MRSA Assay (FDA approved for NASAL specimens only), is one component of a comprehensive MRSA colonization surveillance program. It is not intended to diagnose MRSA infection nor  to guide or monitor treatment for MRSA infections. Performed at [facility], [place] [address] [place], [place] [address]   Culture, blood (Routine X 2) w Reflex to ID Panel     Status: None (Preliminary result)   Collection Time: 10/02/18  1:15 PM   Specimen: BLOOD LEFT HAND  Result Value Ref Range Status   Specimen Description BLOOD LEFT HAND  Final   Special Requests AEROBIC BOTTLE ONLY Blood Culture adequate volume  Final   Culture   Final    NO GROWTH < 24 HOURS Performed at [facility], [address], [place], [place] [address]    Report Status PENDING  Incomplete    Studies/Results: Dg Chest 1 View  Result Date: 10/02/2018 CLINICAL DATA:  Pneumonia EXAM: CHEST  1 VIEW COMPARISON:  October 01, 2018 FINDINGS: The heart size and mediastinal contours are stable. Mild hazy opacities are identified in both lung bases improved compared prior exam. There is no pleural effusion or pulmonary edema. The visualized skeletal structures are stable. IMPRESSION: Mild hazy opacity identified in both lung bases, improved compared prior exam. Electronically Signed   By: Sherian Rein M.D.   On: 10/02/2018 08:36   Dg Chest Port 1 View  Result Date: 10/03/2018 CLINICAL DATA:  Shortness of breath, acute respiratory failure with hypoxia. EXAM: PORTABLE CHEST 1 VIEW COMPARISON:  10/02/2018 FINDINGS: Markedly increased airspace densities in the mid and lower left lung. Mild elevation of the right hemidiaphragm. Heart and mediastinum are stable. Again noted is a prominent right paratracheal density. Negative for a pneumothorax. Patient is slightly rotated on this examination. IMPRESSION: Significant new airspace densities in the left lung. Findings are highly concerning for pneumonia. Prominent right paratracheal density could be related to enlarged azygos vein and vascular congestion but also worrisome for lymphadenopathy. Recommend continued attention to this area on follow up imaging and may need  further characterization with chest CT. Electronically Signed   By: Richarda Overlie M.D.   On: 10/03/2018 08:01   Dg Chest Port 1 View  Result Date: 10/01/2018 CLINICAL DATA:  Hypoxia, fever, emesis today. pt from a home and just returned from [facility] yesterday and family reported patient was fine when she went to bed. History of GERD. EXAM: PORTABLE CHEST 1 VIEW COMPARISON:  None. FINDINGS: Hazy airspace opacities are noted bilaterally accentuated by low lung volumes. No pleural effusion or pneumothorax. Cardiac silhouette is normal in size.  No mediastinal hilar masses. Skeletal structures are grossly intact. IMPRESSION: Bilateral hazy airspace lung opacities consistent multifocal pneumonia. Electronically Signed   By: Amie Portland M.D.   On: 10/01/2018 18:58      Assessment/Plan:  INTERVAL HISTORY: TTE w vegetation ejection on the aortic valve.   Active Problems:   Severe sepsis ([name])    KIRSTEN SPEARING is a 58 y.o. 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  Specific aspects of clinical management are critical to optimizing the outcome of patients with SAB.  Therefore, the [facility] Antimicrobial Management Team [facility]) has initiated an intervention aimed at improving the management of SAB at [facility].  To do so, Infectious Diseases physicians are providing an evidence-based consult for the management of all patients with SAB.     Yes No Comments  Perform follow-up blood cultures (even if the patient is afebrile) to ensure clearance of bacteremia [x]  []    Remove vascular catheter and obtain follow-up blood cultures after the removal of the catheter []  []  DO NOT PLACE LINE UNTIL CLEAR X 5 DAYS repeat cultures  Perform echocardiography to  evaluate for endocarditis (transthoracic ECHO is 40-50% sensitive, TEE is > 90% sensitive) [x]  []  TTE shows vegetation  Can get TEE to further elucidate   Consult electrophysiologist to evaluate implanted cardiac device (pacemaker, ICD) []  []    Ensure source control [x]  []  Have all abscesses been drained effectively? Have deep seeded infections (septic joints or osteomyelitis) had appropriate surgical debridement?  Investigate for metastatic sites of infection [x]  []  Does the patient have ANY symptom or physical exam finding that would suggest a deeper infection (back or neck pain that may be suggestive of vertebral osteomyelitis or epidural abscess, muscle pain that could be a symptom of pyomyositis)?  Keep in mind that for deep seeded infections MRI imaging with contrast is preferred rather than other often insensitive tests such as plain x-rays, especially early in a patient's presentation.  Change antibiotic therapy to cefazolin []  []  Beta-lactam antibiotics are preferred for MSSA due to higher cure rates.   If on Vancomycin, goal trough should be 15 - 20 mcg/mL  Estimated duration of IV antibiotic therapy:  6 weeks []  []  Consult case management for probably prolonged outpatient IV antibiotic therapy    #2  Endocarditis Evaluation   Infective endocarditis (IE) is associated with a high rate of complications and mortality.  Specific aspects of clinical management are critical to optimizing the outcome of patients with IE. Therefore, the Pharmacy Residency Program at [facility] has initiated an evidence-based consult aimed at improving the management of IE at [facility].     Comments  Consult to ID Outpatient follow-up appointment to be made prior to discharge.  Utilization of ID recommended antibiotic therapy   Surgery Evaluation Cardiothoracic surgery consult if the following indications are present:  - Heart failure associated to valve dysfunction - Endocarditis complicated by heart  block or aortic abscess - Left-sided endocarditis caused by S. aureus, fungal, or multi-drug resistant organisms - Left-sided endocarditis with severe valve dysfunction - Large vegetation (>1 cm) on aortic or mitral valve - Persistence of infection 5-7 days after initiation of appropriate antibiotic therapy - Recurrent emboli and persistent vegetations despite appropriate antibiotic therapy - Any history with left-sided embolization with persistent vegetations on TEE  Consult electrophysiologist if presence of implanted cardiac device (pacemaker, ICD)   Consult to cardiology if left ventricular ejection fraction is <40% Evaluation for appropriate heart failure medications upon discharge: - ACE/ARB - Diuretic - Beta blocker  Outpatient follow-up appointment to be made prior to discharge.   Based on the ID MD's evaluation of this patient, the following are recommended to complete a thorough evaluation and care plan to reflect guideline-recommended therapy:  Would get CT surgery consult but likely get TEE first  Make Outpatient ID appt.      LOS: 2 days   Acey LavCornelius Van Dam 10/03/2018, 10:34 AM

## 2018-10-03 NOTE — Progress Notes (Addendum)
   [facility] has been requested to perform a transesophageal echocardiogram on Audrey Collier for bacteremia. Patient has baseline cognitive impairment and it was not felt like patient could consent. Therefore, called and and got consent from Patrice Paradise, patient's sister-in-law. Patient lives with [name] and [name] has been the family member who staff has been communicating with (patient does not legally have a healthcare power of attorney).  After careful review of history and examination, the risks and benefits of transesophageal echocardiogram have been explained including risks of esophageal damage, perforation (1:10,000 risk), bleeding, pharyngeal hematoma as well as other potential complications associated with conscious sedation including aspiration, arrhythmia, respiratory failure and death. Alternatives to treatment were discussed, questions were answered. Patrice Paradise agreed to proceed with the procedure. RN Araceli and I both signed consent form and it was placed in patient's paper chart.   Procedure scheduled for 10/04/2018 at 10:00am with Dr. Stanford Breed.  Of note, patient has dropping hemoglobin. Hemoglobin 9.2, down from 10.5 yesterday and 12.9 the day before. Patient has daily CBC order so will recheck tomorrow.   Darreld Mclean, PA-C 10/03/2018 2:45 PM

## 2018-10-03 NOTE — Progress Notes (Addendum)
PROGRESS NOTE  Audrey Collier CNO:[id number] DOB: 06/26/1960 DOA: 10/01/2018 PCP: Jani Gravel, MD  Brief History   58 year old morbidly obese white lady Mild cognitive deficit Bipolar Postmenopausal bleeding status post work-up with endometrial ablation-pathology benign secretory changes 2016 Intractable constipation followed by gastroenterology at [facility]-reflux Recent cataract surgery 09/2017 right eye  Recent vacation in [place]-found to be sick lethargic subsequently T-max 103.8-brought to emergency room T-max 101.8 pulse 95 respiration 32 WBC 16 CXR = pneumonia bilaterally Hypotensive on admission Coronavirus testing negative x2 CRP 35 procalcitonin 30.3 dimer 20 fibrinogen 771  Started on azithromycin and ceftriaxone to cover community-acquired pneumonia-IV fluids, bolus  A & P  Severe sepsis secondary to MSSA with possible vegetation on TTE L sided PNS on CXR possible DIC? Acute respiratory failure Keep n.p.o. for probable TEE echo---if ID feels wouldn't change management-then 6 weeks abx and cancel procedure--nursing aware to alert me if no plans for procedure by 12:00 pm  Labs all are improved-continuing Ancef high-dose bacteremia DILUTIONAL ANEMIA No further work up unless actively exsanguinating--she has been on 200cc/h of fluid since admit Lung LAN--very poor portable CXR 8/3 tomorrowq obtain better CXR 2 view --very poor film 8/3 Coronavirus negative x2 Regular routine management of sepsis as above Cognitive defects, operates that 2nd-3rd grade level Bipolar Resume meds slowly post TEE if done today Hypothyroidism Continue levothyroxine 125 daily Hyperglycemia without diagnosis diabetes mellitus Monitor sugars only-would not cover unless above 140 ?  Fall Bruising on right ear right flank right hip See below exam but no concerns for fractures Keep on stress prophylaxis for ulcers Protonix twice daily IV   DVT prophylaxis: SCDs Code  Status: Full  Family Communication:  Disposition Plan: Keep on progressive   Verneita Griffes, MD [facility] 9:12 AM  10/03/2018, 9:12 AM  LOS: 2 days   Consultants  . Critical care consulted for opinion  Procedures  . nad  Antibiotics  . Ancef high dose  Interval History/Subjective   More awake alert continues to improve compared to admission Some hypotension still in systolic 29'U   Objective   Vitals:  Vitals:   10/03/18 0737 10/03/18 0836  BP: 96/61 (!) 101/59  Pulse: 97 95  Resp:  (!) 38  Temp: 99.3 F (37.4 C) 98.6 F (37 C)  SpO2: (!) 89% 93%    Exam:  EOMI NCAT arouses no deficit Moving all 4 limbs equally Chest overall clear bilaterally and posterolaterally no rales no rhonchi Bruising over lower extremity skin some cuts Abdomen is soft obese nontender No lower extremity edema Cannot assess for JVD No thyromegaly Neurologically able to move all 4 limbs equally   I have personally reviewed the following:   Today's Data  . As below  Lab Data  . Platelets stable 56 range . WBC down to 5.6 hemoglobin down to 9.2 . Sodium up from 1 41-1 46+ chloride up from 10 7-1 20 . CO2 down to 21 . BUNs/creatinine down from admission to 24/1.3 calcium down to 7.5 probably in 2.0 . Procalcitonin down from admission to 24 lactic acid has trended to 0.9 and we have stopped checking   Micro Data  . MSSA growing in the blood  Imaging  . Chest x-ray I poor film 8/3  Cardiology Data  . Sinus, sinus tach . Await decision about TEE patient is n.p.o.  Other Data  .   Scheduled Meds: . calcium-vitamin D  1 tablet Oral Q breakfast  . docusate sodium  100 mg  Oral Q M,W,F  . FLUoxetine  10 mg Oral QHS  . insulin aspart  0-9 Units Subcutaneous Q4H  . levothyroxine  75 mcg Intravenous Daily  . loratadine  10 mg Oral Daily  . montelukast  10 mg Oral QHS  . multivitamin with minerals  1 tablet Oral Daily  . pantoprazole (PROTONIX) IV  40 mg Intravenous  Q12H   Continuous Infusions: . sodium chloride 1,000 mL (10/02/18 2348)  .  ceFAZolin (ANCEF) IV 2 g (10/03/18 0448)    Active Problems:   Severe sepsis ([name])   LOS: 2 days   How to contact the [facility] Attending or Consulting provider 7A - 7P or covering provider during after hours 7P -7A, for this patient?  1. Check the care team in [facility] and look for a) attending/consulting [facility] provider listed and b) the [facility] listed 2. Log into [email or web address] and use [facility] universal password to access. If you do not have the password, please contact the hospital operator. 3. Locate the [facility] provider you are looking for under [facility] and page to a number that you can be directly reached. 4. If you still have difficulty reaching the provider, please page the [facility] (Director on Call) for the Hospitalists listed on amion for assistance.

## 2018-10-04 ENCOUNTER — Inpatient Hospital Stay (HOSPITAL_COMMUNITY): Payer: Medicaid Other

## 2018-10-04 DIAGNOSIS — A4101 Sepsis due to Methicillin susceptible Staphylococcus aureus: Principal | ICD-10-CM

## 2018-10-04 DIAGNOSIS — J9601 Acute respiratory failure with hypoxia: Secondary | ICD-10-CM

## 2018-10-04 LAB — POCT I-STAT 7, (LYTES, BLD GAS, ICA,H+H)
Acid-base deficit: 2 mmol/L (ref 0.0–2.0)
Bicarbonate: 24.3 mmol/L (ref 20.0–28.0)
Calcium, Ion: 1.34 mmol/L (ref 1.15–1.40)
HCT: 27 % — ABNORMAL LOW (ref 36.0–46.0)
Hemoglobin: 9.2 g/dL — ABNORMAL LOW (ref 12.0–15.0)
O2 Saturation: 96 %
Patient temperature: 98.7
Potassium: 3.8 mmol/L (ref 3.5–5.1)
Sodium: 147 mmol/L — ABNORMAL HIGH (ref 135–145)
TCO2: 26 mmol/L (ref 22–32)
pCO2 arterial: 50.3 mmHg — ABNORMAL HIGH (ref 32.0–48.0)
pH, Arterial: 7.292 — ABNORMAL LOW (ref 7.350–7.450)
pO2, Arterial: 92 mmHg (ref 83.0–108.0)

## 2018-10-04 LAB — CBC
HCT: 30 % — ABNORMAL LOW (ref 36.0–46.0)
Hemoglobin: 9.3 g/dL — ABNORMAL LOW (ref 12.0–15.0)
MCH: 27.7 pg (ref 26.0–34.0)
MCHC: 31 g/dL (ref 30.0–36.0)
MCV: 89.3 fL (ref 80.0–100.0)
Platelets: 82 10*3/uL — ABNORMAL LOW (ref 150–400)
RBC: 3.36 MIL/uL — ABNORMAL LOW (ref 3.87–5.11)
RDW: 15.5 % (ref 11.5–15.5)
WBC: 5.3 10*3/uL (ref 4.0–10.5)
nRBC: 0 % (ref 0.0–0.2)

## 2018-10-04 LAB — CULTURE, BLOOD (ROUTINE X 2)
Special Requests: ADEQUATE
Special Requests: ADEQUATE

## 2018-10-04 LAB — GLUCOSE, CAPILLARY
Glucose-Capillary: 101 mg/dL — ABNORMAL HIGH (ref 70–99)
Glucose-Capillary: 105 mg/dL — ABNORMAL HIGH (ref 70–99)
Glucose-Capillary: 108 mg/dL — ABNORMAL HIGH (ref 70–99)
Glucose-Capillary: 112 mg/dL — ABNORMAL HIGH (ref 70–99)
Glucose-Capillary: 114 mg/dL — ABNORMAL HIGH (ref 70–99)
Glucose-Capillary: 120 mg/dL — ABNORMAL HIGH (ref 70–99)
Glucose-Capillary: 162 mg/dL — ABNORMAL HIGH (ref 70–99)
Glucose-Capillary: 97 mg/dL (ref 70–99)

## 2018-10-04 LAB — COMPREHENSIVE METABOLIC PANEL
ALT: 24 U/L (ref 0–44)
AST: 52 U/L — ABNORMAL HIGH (ref 15–41)
Albumin: 1.9 g/dL — ABNORMAL LOW (ref 3.5–5.0)
Alkaline Phosphatase: 72 U/L (ref 38–126)
Anion gap: 9 (ref 5–15)
BUN: 27 mg/dL — ABNORMAL HIGH (ref 6–20)
CO2: 22 mmol/L (ref 22–32)
Calcium: 8.6 mg/dL — ABNORMAL LOW (ref 8.9–10.3)
Chloride: 115 mmol/L — ABNORMAL HIGH (ref 98–111)
Creatinine, Ser: 1.17 mg/dL — ABNORMAL HIGH (ref 0.44–1.00)
GFR calc Af Amer: 60 mL/min — ABNORMAL LOW (ref >60)
GFR calc non Af Amer: 52 mL/min — ABNORMAL LOW (ref >60)
Glucose, Bld: 116 mg/dL — ABNORMAL HIGH (ref 70–99)
Potassium: 4.2 mmol/L (ref 3.5–5.1)
Sodium: 146 mmol/L — ABNORMAL HIGH (ref 135–145)
Total Bilirubin: 0.6 mg/dL (ref 0.3–1.2)
Total Protein: 5.3 g/dL — ABNORMAL LOW (ref 6.5–8.1)

## 2018-10-04 LAB — PHOSPHORUS
Phosphorus: 2.8 mg/dL (ref 2.5–4.6)
Phosphorus: 2.4 mg/dL — ABNORMAL LOW (ref 2.5–4.6)

## 2018-10-04 LAB — PROCALCITONIN: Procalcitonin: 9.64 ng/mL

## 2018-10-04 LAB — MAGNESIUM
Magnesium: 2.4 mg/dL (ref 1.7–2.4)
Magnesium: 2.3 mg/dL (ref 1.7–2.4)

## 2018-10-04 MED ORDER — VITAL HIGH PROTEIN PO LIQD: 1000.0000 mL | ORAL | Status: DC

## 2018-10-04 MED ORDER — MIDAZOLAM HCL 2 MG/2ML IJ SOLN
2.0000 mg | Freq: Once | INTRAMUSCULAR | Status: AC
  Administered 2018-10-04: 2 mg via INTRAVENOUS

## 2018-10-04 MED ORDER — LACTATED RINGERS IV SOLN
INTRAVENOUS | Status: DC
  Administered 2018-10-04 – 2018-10-08 (×3): via INTRAVENOUS

## 2018-10-04 MED ORDER — MIDAZOLAM HCL 2 MG/2ML IJ SOLN: 2.0000 mg | INTRAMUSCULAR | Status: DC | PRN

## 2018-10-04 MED ORDER — CHLORHEXIDINE GLUCONATE 0.12% ORAL RINSE (MEDLINE KIT)
15.0000 mL | Freq: Two times a day (BID) | OROMUCOSAL | Status: DC
  Administered 2018-10-04 – 2018-10-06 (×6): 15 mL via OROMUCOSAL

## 2018-10-04 MED ORDER — ACETAMINOPHEN 325 MG PO TABS
650.0000 mg | ORAL_TABLET | Freq: Four times a day (QID) | ORAL | Status: DC | PRN
  Administered 2018-10-04 – 2018-10-05 (×2): 650 mg
  Filled 2018-10-04 (×2): qty 2

## 2018-10-04 MED ORDER — SODIUM CHLORIDE 0.9% FLUSH: 10.0000 mL | INTRAVENOUS | Status: DC | PRN

## 2018-10-04 MED ORDER — ORAL CARE MOUTH RINSE
15.0000 mL | OROMUCOSAL | Status: DC
  Administered 2018-10-04 – 2018-10-07 (×28): 15 mL via OROMUCOSAL

## 2018-10-04 MED ORDER — SODIUM CHLORIDE 0.9% FLUSH
10.0000 mL | Freq: Two times a day (BID) | INTRAVENOUS | Status: DC
  Administered 2018-10-04 – 2018-10-06 (×5): 10 mL
  Administered 2018-10-08: 20 mL
  Administered 2018-10-08 – 2018-10-18 (×10): 10 mL

## 2018-10-04 MED ORDER — BISACODYL 10 MG RE SUPP: 10.0000 mg | Freq: Every day | RECTAL | Status: DC | PRN

## 2018-10-04 MED ORDER — FENTANYL 2500MCG IN NS 250ML (10MCG/ML) PREMIX INFUSION
50.0000 ug/h | INTRAVENOUS | Status: DC
  Administered 2018-10-04: 100 ug/h via INTRAVENOUS
  Administered 2018-10-06: 50 ug/h via INTRAVENOUS
  Administered 2018-10-07: 03:00:00 150 ug/h via INTRAVENOUS
  Administered 2018-10-07 (×2): 50 ug/h via INTRAVENOUS
  Filled 2018-10-04 (×4): qty 250

## 2018-10-04 MED ORDER — FENTANYL BOLUS VIA INFUSION
50.0000 ug | INTRAVENOUS | Status: DC | PRN
  Administered 2018-10-06 – 2018-10-07 (×4): 50 ug via INTRAVENOUS
  Filled 2018-10-04: qty 50

## 2018-10-04 MED ORDER — ROCURONIUM BROMIDE 50 MG/5ML IV SOLN
60.0000 mg | Freq: Once | INTRAVENOUS | Status: AC
  Administered 2018-10-04: 60 mg via INTRAVENOUS
  Filled 2018-10-04: qty 6

## 2018-10-04 MED ORDER — ETOMIDATE 2 MG/ML IV SOLN
20.0000 mg | Freq: Once | INTRAVENOUS | Status: AC
  Administered 2018-10-04: 20 mg via INTRAVENOUS

## 2018-10-04 MED ORDER — MIDAZOLAM HCL 2 MG/2ML IJ SOLN
INTRAMUSCULAR | Status: AC
  Filled 2018-10-04: qty 2

## 2018-10-04 MED ORDER — FENTANYL CITRATE (PF) 100 MCG/2ML IJ SOLN
100.0000 ug | Freq: Once | INTRAMUSCULAR | Status: AC
  Administered 2018-10-04: 100 ug via INTRAVENOUS

## 2018-10-04 MED ORDER — PRO-STAT SUGAR FREE PO LIQD: 30.0000 mL | Freq: Two times a day (BID) | ORAL | Status: DC

## 2018-10-04 MED ORDER — INSULIN ASPART 100 UNIT/ML ~~LOC~~ SOLN
2.0000 [IU] | SUBCUTANEOUS | Status: DC
  Administered 2018-10-05: 4 [IU] via SUBCUTANEOUS
  Administered 2018-10-06: 2 [IU] via SUBCUTANEOUS
  Administered 2018-10-06: 4 [IU] via SUBCUTANEOUS

## 2018-10-04 MED ORDER — FUROSEMIDE 10 MG/ML IJ SOLN
40.0000 mg | Freq: Once | INTRAMUSCULAR | Status: AC
  Administered 2018-10-04: 40 mg via INTRAVENOUS
  Filled 2018-10-04: qty 4

## 2018-10-04 MED ORDER — PANTOPRAZOLE SODIUM 40 MG PO PACK
40.0000 mg | PACK | ORAL | Status: DC
  Administered 2018-10-04 – 2018-10-06 (×3): 40 mg
  Filled 2018-10-04 (×4): qty 20

## 2018-10-04 MED ORDER — FENTANYL CITRATE (PF) 100 MCG/2ML IJ SOLN
INTRAMUSCULAR | Status: AC
  Filled 2018-10-04: qty 2

## 2018-10-04 MED ORDER — LACTATED RINGERS IV BOLUS
500.0000 mL | Freq: Once | INTRAVENOUS | Status: AC
  Administered 2018-10-04: 500 mL via INTRAVENOUS

## 2018-10-04 MED ORDER — MONTELUKAST SODIUM 10 MG PO TABS
10.0000 mg | ORAL_TABLET | Freq: Every day | ORAL | Status: DC
  Administered 2018-10-04 – 2018-10-07 (×4): 10 mg
  Filled 2018-10-04 (×4): qty 1

## 2018-10-04 MED ORDER — PROPOFOL 1000 MG/100ML IV EMUL: 0.0000 ug/kg/min | INTRAVENOUS | Status: DC

## 2018-10-04 MED ORDER — VITAL AF 1.2 CAL PO LIQD
1000.0000 mL | ORAL | Status: DC
  Administered 2018-10-04: 1000 mL

## 2018-10-04 MED ORDER — DOCUSATE SODIUM 50 MG/5ML PO LIQD
100.0000 mg | Freq: Two times a day (BID) | ORAL | Status: DC | PRN
  Administered 2018-10-05: 100 mg
  Filled 2018-10-04: qty 10

## 2018-10-04 MED ORDER — FLUOXETINE HCL 10 MG PO CAPS
10.0000 mg | ORAL_CAPSULE | Freq: Every day | ORAL | Status: DC
  Administered 2018-10-04 – 2018-10-06 (×3): 10 mg
  Filled 2018-10-04 (×5): qty 1

## 2018-10-04 NOTE — Progress Notes (Signed)
Initial Nutrition Assessment  DOCUMENTATION CODES:   Not applicable  INTERVENTION:    Vital AF 1.2 at 60 ml/h (1440 ml per day)   Provides 1728 kcal, 108 gm protein, 1168 ml free water daily  NUTRITION DIAGNOSIS:   Inadequate oral intake related to inability to eat as evidenced by NPO status.  GOAL:   Patient will meet greater than or equal to 90% of their needs  MONITOR:   Vent status, TF tolerance, Labs  REASON FOR ASSESSMENT:   Consult Enteral/tube feeding initiation and management  ASSESSMENT:   58 yo female admitted with fever, vomiting, weakness. Found to have MSSA bacteremia with PNA. Echo showed vegetation. Required transfer to the ICU and intubation on 8/4. PMH includes developmental delay, GERD, anxiety, hypothyroidism, HLD.   Received MD Consult for TF initiation and management. OG tube in place.   Patient is currently intubated on ventilator support MV: 8.6 L/min Temp (24hrs), Avg:98.6 F (37 C), Min:97.9 F (36.6 C), Max:99.8 F (37.7 C)   Labs reviewed. Sodium 146 (H) 8/3 CBG's: 112-108-97  Medications reviewed and include Oscal with vitamin D, Colace, novolog.   NUTRITION - FOCUSED PHYSICAL EXAM:    Most Recent Value  Orbital Region  No depletion  Upper Arm Region  No depletion  Thoracic and Lumbar Region  No depletion  Buccal Region  No depletion  Temple Region  No depletion  Clavicle Bone Region  No depletion  Clavicle and Acromion Bone Region  No depletion  Scapular Bone Region  No depletion  Dorsal Hand  No depletion  Patellar Region  No depletion  Anterior Thigh Region  No depletion  Posterior Calf Region  No depletion  Edema (RD Assessment)  None  Hair  Reviewed  Eyes  Unable to assess  Mouth  Unable to assess  Skin  Reviewed  Nails  Reviewed       Diet Order:   Diet Order            Diet NPO time specified  Diet effective now              EDUCATION NEEDS:   No education needs have been identified at this  time  Skin:  Skin Assessment: Reviewed RN Assessment(skin tear and blister to R arm)  Last BM:  PTA  Height:   Ht Readings from Last 1 Encounters:  10/01/18 5\' 5"  (1.651 m)    Weight:   Wt Readings from Last 1 Encounters:  10/04/18 76 kg    Ideal Body Weight:  56.8 kg  BMI:  Body mass index is 27.88 kg/m.  Estimated Nutritional Needs:   Kcal:  1650  Protein:  100-115 gm  Fluid:  1.7 L    Molli Barrows, RD, LDN, [place] Pager [phone] After Hours Pager [phone]

## 2018-10-04 NOTE — Procedures (Signed)
Intubation Procedure Note Audrey Collier [id number] June 10, 1960  Procedure: Intubation Indications: Airway protection and maintenance  Procedure Details Consent: Risks of procedure as well as the alternatives and risks of each were explained to the (patient/caregiver).  Consent for procedure obtained. Time Out: Verified patient identification, verified procedure, site/side was marked, verified correct patient position, special equipment/implants available, medications/allergies/relevent history reviewed, required imaging and test results available.  Performed  3  Medications used: Etomidate 20mg , midazolam 2mg , fentanyl 40mcg, Rocuronium 60mg   Evaluation Hemodynamic Status: BP stable throughout; O2 sats: stable throughout Patient's Current Condition: stable Complications: No apparent complications Patient did tolerate procedure well. Chest X-ray ordered to verify placement.  CXR: pending.   [employer] 10/04/2018

## 2018-10-04 NOTE — Progress Notes (Signed)
eLink Physician-Brief Progress Note Patient Name: Audrey Collier DOB: 1960/10/20 MRN: [id number]   Date of Service  10/04/2018  HPI/Events of Note  Notified of borderline low BP, febrile 102.2. Adequate urine output on IVF 50 cc/hr  eICU Interventions  Ordered 500 cc LR bolus given increased insensible loss from fever     Intervention Category Major Interventions: Hypotension - evaluation and management  Judd Lien 10/04/2018, 10:41 PM

## 2018-10-04 NOTE — Progress Notes (Signed)
NAME:  Audrey Collier, MRN:  [id number], DOB:  [birth year], LOS: 3 ADMISSION DATE:  10/01/2018, CONSULTATION DATE:  10/02/18 REFERRING MD:  Dr. Verlon Au, CHIEF COMPLAINT: Vomiting, fever   Brief History   58 yo female presented with fever, vomiting, weakness.  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  Echo showed vegetation.  Transferred to ICU 8/04 due to respiratory failure.  Past Medical History  Developmental Delay, GERD, Anxiety, Hypothyroidism, [place] Hospital Events   8/01 Admit with fever, vomiting and weakness 8/04 transfer to ICU  Consults:  Cardiology ID  Procedures:     Significant Diagnostic Tests:  Echo 8/02 >> EF 55 to 60%, semi mobile density on aortic valve and tricuspid valve  Micro Data:  SARS CoV2 8/01 >> negative Blood 8/01 >> MSSA SARS CoV2 8/02 >> negative Blood 8/02 >>   Antimicrobials:  Ancef 8/2 >>   Interim history/subjective:  Was to have TEE this morning.  Canceled due to hypoxia and WOB.  Objective   Blood pressure (!) 105/55, pulse 84, temperature 98.2 F (36.8 C), temperature source Oral, resp. rate (!) 33, height 5\' 5"  (1.651 m), weight 76 kg, last menstrual period 04/16/2013, SpO2 92 %.        Intake/Output Summary (Last 24 hours) at 10/04/2018 1118 Last data filed at 10/04/2018 1000 Gross per 24 hour  Intake 1025.83 ml  Output 1650 ml  Net -624.17 ml   Filed Weights   10/02/18 0239 10/02/18 0300 10/04/18 0631  Weight: 77.7 kg 77.7 kg 76 kg    Examination:  General - somnolent, increased WOB, having trouble speaking in full sentences Eyes - pupils reactive ENT - no sinus tenderness, no stridor Cardiac - regular rate/rhythm, no murmur Chest - b/l crackles Abdomen - soft, non tender, + bowel sounds Extremities - no cyanosis, clubbing, or edema Skin - cool, clammy Neuro - follows simple commands  CXR (reviewed by me) - b/l ASD   Resolved Hospital Problem list     Assessment & Plan:   Acute hypoxic  respiratory failure from MSSA pneumonia. 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 - continue ABx - cardiology arranging for TEE  Anemia of critical illness. Thrombocytopenia in setting of sepsis. - f/u CBC - transfuse for Hb < 7 or significant bleeding  Hx of hypothyroidism. - continue synthroid   Best practice:  Diet: NPO DVT prophylaxis: SCDs GI prophylaxis: Protonix Mobility: bed rest Code Status: Full Code  Disposition: ICU  Labs    CMP Latest Ref Rng & Units 10/03/2018 10/02/2018 10/01/2018  Glucose 70 - 99 mg/dL 107(H) 135(H) 138(H)  BUN 6 - 20 mg/dL 24(H) 26(H) 24(H)  Creatinine 0.44 - 1.00 mg/dL 1.34(H) 1.55(H) 1.52(H)  Sodium 135 - 145 mmol/L 146(H) 141 140  Potassium 3.5 - 5.1 mmol/L 3.8 3.6 3.5  Chloride 98 - 111 mmol/L 120(H) 107 104  CO2 22 - 32 mmol/L 21(L) 23 25  Calcium 8.9 - 10.3 mg/dL 7.5(L) 7.7(L) 8.7(L)  Total Protein 6.5 - 8.1 g/dL 4.9(L) 5.4(L) 6.9  Total Bilirubin 0.3 - 1.2 mg/dL 0.8 0.7 1.7(H)  Alkaline Phos 38 - 126 U/L 53 65 102  AST 15 - 41 U/L 57(H) 77(H) 58(H)  ALT 0 - 44 U/L 37 49(H) 37   CBC Latest Ref Rng & Units 10/03/2018 10/02/2018 10/01/2018  WBC 4.0 - 10.5 K/uL 5.6 12.7(H) 16.3(H)  Hemoglobin 12.0 -  15.0 g/dL 1.6(X9.2(L) 10.5(L) 12.9  Hematocrit 36.0 - 46.0 % 29.3(L) 32.1(L) 41.0  Platelets 150 - 400 K/uL 56(L) 57(L) 68(L)   ABG No results found for: PHART, PCO2ART, PO2ART, HCO3, TCO2, ACIDBASEDEF, O2SAT  CBG (last 3)  Recent Labs    10/04/18 0319 10/04/18 0810 10/04/18 0837  GLUCAP 112* 108* 97    D/w Dr. Mahala MenghiniSamtani  CC time 34 minutes  Coralyn HellingVineet Angi Goodell, MD [facility] 10/04/2018, 11:28 AM

## 2018-10-04 NOTE — Progress Notes (Addendum)
Appreciate greatly input from cardiology  Patient yesterday was having lower blood pressures in the evening I was told about this and cut back fluids from 250 cc an hour-she is maintained a map over 60  Today I was called by Dr. Stanford Breed regarding patient not looking optimal for TEE and increased work of breathing-she was on 2 L yesterday and is currently on 4   -she is still arousable although she is breathing about 30 times a minute X-ray definitely looks worse and trying a dose of Lasix-labs are still pending nursing is aware to call lab to get these done stat-these were ordered as daily labs and still have not been done at 11 AM I have had a very long discussion with the family on the phone regarding possibility of intubation and explained clearly that this is not a permanent thing-they have familiarity with terms such as sepsis as a family member in the past has had this  Dr. Halford Chessman and I have personally discussed and he will assume care which is greatly appreciated  BP (!) 105/55 (BP Location: Left Arm)   Pulse 84   Temp 98.2 F (36.8 C) (Oral)   Resp (!) 33   Ht 5\' 5"  (1.651 m)   Wt 76 kg   LMP 04/16/2013 (Approximate)   SpO2 92%   BMI 27.88 kg/m  Awake coherent but seems to be breathing slightly harder Bilaterally posterolaterally I do hear some faint wheezes but poor exam as patient refuses to sit up despite me getting help from nursing Abdomen is obese Lower extremities are soft Neurologically about the same she does have some deficits No icterus no pallor  Data-no labs today CXR shows progression on the right side as well as on the left side  I have spent >40 minutes seeing this patient today so will only charge for prolonged time in terms of discussion with family and consultants appreciate critical care once again taking over  Verneita Griffes, MD [facility] 11:27 AM

## 2018-10-04 NOTE — Progress Notes (Signed)
Patient in ENDO today for TEE with moderate sedation, her respiratory status was not at a level where [facility] felt it would be safe to moderately sedate her. Unfortunately, did not have room to put her on today with Anesthesia, will put her on schedule tomorrow 10/05/18.

## 2018-10-04 NOTE — Progress Notes (Signed)
Patient presented for transesophageal echocardiogram today for bacteremia.  Upon my evaluation she was dyspneic with respiratory rate 36-40 and O2 saturation 90% on 2.5 L.  Chest x-ray personally reviewed and shows bilateral infiltrates worse compared to previous. Also with h/o cognitive deficits. I do not think she will tolerate transesophageal echocardiogram without general anesthesia which was not available today.  We have rescheduled for tomorrow with the assistance of anesthesia.  Kirk Ruths, MD

## 2018-10-05 ENCOUNTER — Encounter (HOSPITAL_COMMUNITY): Payer: Self-pay | Admitting: Certified Registered Nurse Anesthetist

## 2018-10-05 ENCOUNTER — Inpatient Hospital Stay (HOSPITAL_COMMUNITY): Payer: Medicaid Other

## 2018-10-05 ENCOUNTER — Encounter (HOSPITAL_COMMUNITY)
Admission: EM | Disposition: A | Discharge status: Skilled Nursing Facility | Payer: Self-pay | Source: Home / Self Care | Attending: Thoracic Surgery (Cardiothoracic Vascular Surgery)

## 2018-10-05 ENCOUNTER — Other Ambulatory Visit (HOSPITAL_COMMUNITY): Payer: Medicaid Other

## 2018-10-05 DIAGNOSIS — J189 Pneumonia, unspecified organism: Secondary | ICD-10-CM

## 2018-10-05 DIAGNOSIS — R509 Fever, unspecified: Secondary | ICD-10-CM

## 2018-10-05 DIAGNOSIS — Z978 Presence of other specified devices: Secondary | ICD-10-CM

## 2018-10-05 DIAGNOSIS — Z9911 Dependence on respirator [ventilator] status: Secondary | ICD-10-CM

## 2018-10-05 DIAGNOSIS — J15211 Pneumonia due to Methicillin susceptible Staphylococcus aureus: Secondary | ICD-10-CM

## 2018-10-05 LAB — GLUCOSE, CAPILLARY
Glucose-Capillary: 110 mg/dL — ABNORMAL HIGH (ref 70–99)
Glucose-Capillary: 110 mg/dL — ABNORMAL HIGH (ref 70–99)
Glucose-Capillary: 113 mg/dL — ABNORMAL HIGH (ref 70–99)
Glucose-Capillary: 114 mg/dL — ABNORMAL HIGH (ref 70–99)
Glucose-Capillary: 116 mg/dL — ABNORMAL HIGH (ref 70–99)
Glucose-Capillary: 123 mg/dL — ABNORMAL HIGH (ref 70–99)

## 2018-10-05 LAB — URINALYSIS, ROUTINE W REFLEX MICROSCOPIC
Bilirubin Urine: NEGATIVE
Glucose, UA: NEGATIVE mg/dL
Ketones, ur: NEGATIVE mg/dL
Leukocytes,Ua: NEGATIVE
Nitrite: NEGATIVE
Protein, ur: 100 mg/dL — AB
Specific Gravity, Urine: 1.019 (ref 1.005–1.030)
pH: 5 (ref 5.0–8.0)

## 2018-10-05 LAB — CBC
HCT: 29.9 % — ABNORMAL LOW (ref 36.0–46.0)
Hemoglobin: 9.4 g/dL — ABNORMAL LOW (ref 12.0–15.0)
MCH: 28 pg (ref 26.0–34.0)
MCHC: 31.4 g/dL (ref 30.0–36.0)
MCV: 89 fL (ref 80.0–100.0)
Platelets: 62 10*3/uL — ABNORMAL LOW (ref 150–400)
RBC: 3.36 MIL/uL — ABNORMAL LOW (ref 3.87–5.11)
RDW: 15.7 % — ABNORMAL HIGH (ref 11.5–15.5)
WBC: 8.2 10*3/uL (ref 4.0–10.5)
nRBC: 0 % (ref 0.0–0.2)

## 2018-10-05 LAB — BASIC METABOLIC PANEL
Anion gap: 9 (ref 5–15)
BUN: 29 mg/dL — ABNORMAL HIGH (ref 6–20)
CO2: 25 mmol/L (ref 22–32)
Calcium: 8.6 mg/dL — ABNORMAL LOW (ref 8.9–10.3)
Chloride: 114 mmol/L — ABNORMAL HIGH (ref 98–111)
Creatinine, Ser: 1.12 mg/dL — ABNORMAL HIGH (ref 0.44–1.00)
GFR calc Af Amer: >60 mL/min (ref >60)
GFR calc non Af Amer: 54 mL/min — ABNORMAL LOW (ref >60)
Glucose, Bld: 135 mg/dL — ABNORMAL HIGH (ref 70–99)
Potassium: 3.7 mmol/L (ref 3.5–5.1)
Sodium: 148 mmol/L — ABNORMAL HIGH (ref 135–145)

## 2018-10-05 LAB — TRIGLYCERIDES: Triglycerides: 159 mg/dL — ABNORMAL HIGH (ref <150)

## 2018-10-05 LAB — PHOSPHORUS
Phosphorus: 2 mg/dL — ABNORMAL LOW (ref 2.5–4.6)
Phosphorus: 3.2 mg/dL (ref 2.5–4.6)

## 2018-10-05 LAB — MAGNESIUM
Magnesium: 2.4 mg/dL (ref 1.7–2.4)
Magnesium: 2.3 mg/dL (ref 1.7–2.4)

## 2018-10-05 SURGERY — CANCELLED PROCEDURE

## 2018-10-05 MED ORDER — POTASSIUM PHOSPHATES 15 MMOLE/5ML IV SOLN
10.0000 mmol | Freq: Once | INTRAVENOUS | Status: AC
  Administered 2018-10-05: 10 mmol via INTRAVENOUS
  Filled 2018-10-05: qty 3.33

## 2018-10-05 MED ORDER — SODIUM CHLORIDE 0.9 % IV SOLN
2.0000 g | INTRAVENOUS | Status: DC
  Administered 2018-10-05 – 2018-10-10 (×26): 2 g via INTRAVENOUS
  Filled 2018-10-05 (×33): qty 2000

## 2018-10-05 MED ORDER — VITAL AF 1.2 CAL PO LIQD
1000.0000 mL | ORAL | Status: DC
  Administered 2018-10-05 – 2018-10-06 (×2): 1000 mL

## 2018-10-05 MED ORDER — CHLORHEXIDINE GLUCONATE CLOTH 2 % EX PADS
6.0000 | MEDICATED_PAD | Freq: Every day | CUTANEOUS | Status: DC
  Administered 2018-10-05 – 2018-12-06 (×50): 6 via TOPICAL

## 2018-10-05 NOTE — Progress Notes (Addendum)
NAME:  Audrey SevereSheila D Collier, MRN:  [id number], DOB:  1960-08-10, LOS: 4 ADMISSION DATE:  10/01/2018, CONSULTATION DATE:  10/02/18 REFERRING MD:  Dr. Mahala MenghiniSamtani, CHIEF COMPLAINT: Vomiting, fever   Brief History   58 yo female presented with fever, vomiting, weakness.  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  Echo showed vegetation.  Transferred to ICU 8/04 due to respiratory failure.  Past Medical History  Developmental Delay, GERD, Anxiety, Hypothyroidism, HLD  Significant Hospital Events   8/01 Admit with fever, vomiting and weakness 8/04 transfer to ICU  Consults:  Cardiology ID  Procedures:     Significant Diagnostic Tests:  Echo 8/02 >> EF 55 to 60%, semi mobile density on aortic valve and tricuspid valve  Micro Data:  SARS CoV2 8/01 >> negative Blood 8/01 >> MSSA SARS CoV2 8/02 >> negative Blood 8/02 >>   Antimicrobials:  Ancef 8/2 >>   Interim history/subjective:  Required intubation on 10/04/2018.  Cardiology concern was low platelets as far as performing TEE.  Objective   Blood pressure 91/73, pulse 86, temperature (!) 101.3 F (38.5 C), temperature source Oral, resp. rate 19, height 5\' 5"  (1.651 m), weight 82.5 kg, last menstrual period 04/16/2013, SpO2 97 %.    Vent Mode: PRVC FiO2 (%):  [60 %-100 %] 60 % Set Rate:  [24 bmp] 24 bmp Vt Set:  [340 mL] 340 mL PEEP:  [5 cmH20-10 cmH20] 8 cmH20 Plateau Pressure:  [16 cmH20-19 cmH20] 19 cmH20   Intake/Output Summary (Last 24 hours) at 10/05/2018 0931 Last data filed at 10/05/2018 0600 Gross per 24 hour  Intake 3321.64 ml  Output 2700 ml  Net 621.64 ml   Filed Weights   10/02/18 0300 10/04/18 0631 10/05/18 0500  Weight: 77.7 kg 76 kg 82.5 kg    Examination:  General: Well-nourished well-developed female who is lethargic but follows commands HEENT: Endotracheal tube in place.  Gastric tube in place to low wall intermittent suction.  No JVD or lymphadenopathy is appreciated Neuro: Lethargic but follows commands  CV: Heart sounds are regular.  No murmur appreciated PULM: Decreased breath sounds in the base GI: Abdomen soft faint bowel sounds Extremities: warm/dry, baseline 1+ edema  Skin: no rashes or lesions    Resolved Hospital Problem list     Assessment & Plan:   Acute hypoxic respiratory failure from MSSA pneumonia. Continue full ventilatory support Wean as tolerated Would not extubate until decision made on TEE.  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 Continue antimicrobial therapy Cardiology is evaluating for TEE some concern due to low platelets noted to be 62 with concern for bleeding with invasive procedure.  Anemia of critical illness. Thrombocytopenia in setting of sepsis. - f/u CBC - transfuse for Hb < 7 or significant bleeding  Hx of hypothyroidism. Continue Synthroid   Best practice:  Diet: NPO DVT prophylaxis: SCDs GI prophylaxis: Protonix Mobility: bed rest Code Status: Full Code  Disposition: ICU  Labs    CMP Latest Ref Rng & Units 10/05/2018 10/04/2018 10/04/2018  Glucose 70 - 99 mg/dL 409(W135(H) - 119(J116(H)  BUN 6 - 20 mg/dL 47(W29(H) - 29(F27(H)  Creatinine 0.44 - 1.00 mg/dL 6.21(H1.12(H) - 0.86(V1.17(H)  Sodium 135 - 145 mmol/L 148(H) 147(H) 146(H)  Potassium 3.5 - 5.1 mmol/L 3.7 3.8 4.2  Chloride 98 - 111 mmol/L 114(H) - 115(H)  CO2 22 - 32 mmol/L 25 - 22  Calcium 8.9 - 10.3 mg/dL 7.8(I8.6(L) - 8.6(L)  Total Protein 6.5 - 8.1 g/dL - -  5.3(L)  Total Bilirubin 0.3 - 1.2 mg/dL - - 0.6  Alkaline Phos 38 - 126 U/L - - 72  AST 15 - 41 U/L - - 52(H)  ALT 0 - 44 U/L - - 24   CBC Latest Ref Rng & Units 10/05/2018 10/04/2018 10/04/2018  WBC 4.0 - 10.5 K/uL 8.2 - 5.3  Hemoglobin 12.0 - 15.0 g/dL 9.4(L) 9.2(L) 9.3(L)  Hematocrit 36.0 - 46.0 % 29.9(L) 27.0(L) 30.0(L)  Platelets 150 - 400 K/uL 62(L) - 82(L)   ABG    Component Value Date/Time   PHART 7.292 (L) 10/04/2018 1320   PCO2ART 50.3 (H) 10/04/2018 1320   PO2ART 92.0 10/04/2018 1320   HCO3 24.3  10/04/2018 1320   TCO2 26 10/04/2018 1320   ACIDBASEDEF 2.0 10/04/2018 1320   O2SAT 96.0 10/04/2018 1320    CBG (last 3)  Recent Labs    10/04/18 2345 10/05/18 0400 10/05/18 0739  GLUCAP 162* 110* 114*    App cct 35 min   Richardson Landry Minor ACNP [facility] PCC[facility] Pager [phone] till 1 pm If no answer page [phone] 10/05/2018, 9:31 AM   Pulmonary critical care attending:  This is a 58 year old female admitted to the intensive care unit for respiratory failure requiring intubation mechanical ventilation found to have MSSA pneumonia, MSSA bacteremia and 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  She has a TEE pending however this is been on hold due to dropping and platelets and thrombocytopenia.  Patient remains in the intensive care unit on mechanical life support.  Febrile overnight.  Blood pressure 103/67, pulse 80, temperature (!) 101.3 F (38.5 C), temperature source Oral, resp. rate (!) 24, height 5\' 5"  (1.651 m), weight 82.5 kg, last menstrual period 04/16/2013, SpO2 100 %. General: Middle-aged female, intubated sedated, on mechanical ventilation able to follow some commands. HEENT: NCAT, sclera clear, tracking Neck: Endotracheal tube in place, trachea midline Heart: Regular rate and rhythm, S1-S2 Lungs: Bilateral ventilated breath sounds, no crackles Abdomen: Soft nontender nondistended Extremities: No significant edema  Labs reviewed Sodium 148 White blood cell count 8.2  Arterial blood gas mild respiratory acidosis  Chest x-ray: Bilateral infiltrates. CT chest pending  Echocardiogram 10/03/2018 small density on the aortic valve and tricuspid valve with incident bacteremia possible infective endocarditis.  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 Anemia of critical illness and sepsis Thrombocytopenia in the setting of sepsis and ICU stay.  Plan: Continue patient on cefazolin. Sedation with fentanyl and PRN Versed  Propofol discontinued Patient remains in the ICU for hemodynamic respiratory support. CT of the chest ordered today. Wean FiO2 and PEEP to maintain O2 sats greater than 90%.  This patient is critically ill with multiple organ system failure; which, requires frequent high complexity decision making, assessment, support, evaluation, and titration of therapies. This was completed through the application of advanced monitoring technologies and extensive interpretation of multiple databases. During this encounter critical care time was devoted to patient care services described in this note for 34 minutes.   Garner Nash, DO [place] [facility] 10/05/2018 11:07 AM  Personal pager: [phone] If unanswered, please page CCM On-call: [phone]

## 2018-10-05 NOTE — Progress Notes (Addendum)
Subjective: Pt intubated   Antibiotics:  Anti-infectives (From admission, onward)   Start     Dose/Rate Route Frequency Ordered Stop   10/02/18 1200  ceFAZolin (ANCEF) IVPB 2g/100 mL premix     2 g 200 mL/hr over 30 Minutes Intravenous Every 8 hours 10/02/18 1040     10/01/18 2000  cefTRIAXone (ROCEPHIN) 2 g in sodium chloride 0.9 % 100 mL IVPB  Status:  Discontinued     2 g 200 mL/hr over 30 Minutes Intravenous Every 24 hours 10/01/18 1958 10/02/18 1040   10/01/18 2000  azithromycin (ZITHROMAX) 500 mg in sodium chloride 0.9 % 250 mL IVPB  Status:  Discontinued     500 mg 250 mL/hr over 60 Minutes Intravenous Every 24 hours 10/01/18 1958 10/02/18 1040      Medications: Scheduled Meds:  calcium-vitamin D  1 tablet Oral Q breakfast   chlorhexidine gluconate (MEDLINE KIT)  15 mL Mouth Rinse BID   Chlorhexidine Gluconate Cloth  6 each Topical Daily   docusate sodium  100 mg Oral Q M,W,F   FLUoxetine  10 mg Per Tube QHS   insulin aspart  2-6 Units Subcutaneous Q4H   levothyroxine  75 mcg Intravenous Daily   mouth rinse  15 mL Mouth Rinse 10 times per day   montelukast  10 mg Per Tube QHS   pantoprazole sodium  40 mg Per Tube Q24H   sodium chloride flush  10-40 mL Intracatheter Q12H   Continuous Infusions:   ceFAZolin (ANCEF) IV Stopped (10/05/18 0519)   feeding supplement (VITAL AF 1.2 CAL) Stopped (10/05/18 0000)   fentaNYL infusion INTRAVENOUS 25 mcg/hr (10/05/18 0600)   lactated ringers 50 mL/hr at 10/05/18 0600   potassium PHOSPHATE IVPB (in mmol)     propofol (DIPRIVAN) infusion Stopped (10/04/18 1405)   PRN Meds:.acetaminophen, bisacodyl, docusate, fentaNYL, midazolam, midazolam, sodium chloride flush    Objective: Weight change: 6.5 kg  Intake/Output Summary (Last 24 hours) at 10/05/2018 1100 Last data filed at 10/05/2018 0600 Gross per 24 hour  Intake 3321.64 ml  Output 2300 ml  Net 1021.64 ml   Blood pressure 103/67, pulse 80,  temperature (!) 101.3 F (38.5 C), temperature source Oral, resp. rate (!) 24, height 5' 5"  (1.651 m), weight 82.5 kg, last menstrual period 04/16/2013, SpO2 100 %. Temp:  [98.2 F (36.8 C)-102.2 F (39 C)] 101.3 F (38.5 C) (08/05 0741) Pulse Rate:  [77-94] 80 (08/05 1000) Resp:  [7-36] 24 (08/05 1000) BP: (82-125)/(55-80) 103/67 (08/05 1000) SpO2:  [92 %-100 %] 100 % (08/05 1000) FiO2 (%):  [60 %-100 %] 60 % (08/05 0758) Weight:  [82.5 kg] 82.5 kg (08/05 0500)  Physical Exam: General: Alert and awake intubated HEENT: anicteric sclera, EOMI CVS regular rate, normal  Chest: ,fairly clear Abdomen: soft non-distended,  Extremities: no edema or deformity noted bilaterally Skin: no rashes Neuro: nonfocal  CBC:    BMET Recent Labs    10/04/18 1145 10/04/18 1320 10/05/18 0612  NA 146* 147* 148*  K 4.2 3.8 3.7  CL 115*  --  114*  CO2 22  --  25  GLUCOSE 116*  --  135*  BUN 27*  --  29*  CREATININE 1.17*  --  1.12*  CALCIUM 8.6*  --  8.6*     Liver Panel  Recent Labs    10/03/18 0607 10/04/18 1145  PROT 4.9* 5.3*  ALBUMIN 2.0* 1.9*  AST 57* 52*  ALT 37 24  ALKPHOS  53 72  BILITOT 0.8 0.6       Sedimentation Rate No results for input(s): ESRSEDRATE in the last 72 hours. C-Reactive Protein No results for input(s): CRP in the last 72 hours.  Micro Results: Recent Results (from the past 720 hour(s))  Blood Culture (routine x 2)     Status: Abnormal   Collection Time: 10/01/18  6:20 PM   Specimen: BLOOD  Result Value Ref Range Status   Specimen Description   Final    BLOOD RIGHT ANTECUBITAL Performed at [facility], [place] [address] [place], [place] [address]    Special Requests   Final    BOTTLES DRAWN AEROBIC AND ANAEROBIC Blood Culture adequate volume Performed at [facility], [place] [address], [place], [place] [address]    Culture  Setup Time   Final    GRAM POSITIVE COCCI Gram Stain Report Called to,Read Back By and Verified With:  PERRY,C. AT 0516 ON 10/02/2018 BY EVA ANAEROBIC BOTTLE ONLY Performed at [place] Performed at [facility], [address] [place], [place] [address]    Culture (A)  Final    STAPHYLOCOCCUS AUREUS SUSCEPTIBILITIES PERFORMED ON PREVIOUS CULTURE WITHIN THE LAST 5 DAYS. Performed at [facility], [place] [address] [place], [place] [address]    Report Status 10/04/2018 FINAL  Final  Blood Culture (routine x 2)     Status: Abnormal   Collection Time: 10/01/18  6:28 PM   Specimen: BLOOD  Result Value Ref Range Status   Specimen Description BLOOD LEFT ANTECUBITAL  Final   Special Requests   Final    BOTTLES DRAWN AEROBIC AND ANAEROBIC Blood Culture adequate volume   Culture  Setup Time   Final    GRAM POSITIVE COCCI Gram Stain Report Called to,Read Back By and Verified With: PERRY,C. AT 0516 ON 10/02/2018 BY EVA AEROBIC BOTTLE ONLY Performed at [place], READ BACK BY AND VERIFIED WITH: Ailene Rud [phone] MLM Performed at [facility], [place] [address], [place],  [address]    Culture STAPHYLOCOCCUS AUREUS (A)  Final   Report Status 10/04/2018 FINAL  Final   Organism ID, Bacteria STAPHYLOCOCCUS AUREUS  Final      Susceptibility   Staphylococcus aureus - MIC*    CIPROFLOXACIN <=0.5 SENSITIVE Sensitive     ERYTHROMYCIN >=8 RESISTANT Resistant     GENTAMICIN <=0.5 SENSITIVE Sensitive     OXACILLIN <=0.25 SENSITIVE Sensitive     TETRACYCLINE <=1 SENSITIVE Sensitive     VANCOMYCIN <=0.5 SENSITIVE Sensitive     TRIMETH/SULFA <=10 SENSITIVE Sensitive     CLINDAMYCIN RESISTANT Resistant     RIFAMPIN <=0.5 SENSITIVE Sensitive     Inducible Clindamycin POSITIVE Resistant     * STAPHYLOCOCCUS AUREUS  Blood Culture ID Panel (Reflexed)     Status: Abnormal   Collection Time: 10/01/18  6:28 PM  Result Value Ref Range Status   Enterococcus species NOT DETECTED NOT  DETECTED Final   Listeria monocytogenes NOT DETECTED NOT DETECTED Final   Staphylococcus species DETECTED (A) NOT DETECTED Final    Comment: CRITICAL RESULT CALLED TO, READ BACK BY AND VERIFIED WITH: PHARMD M MACCIA [id number] [phone] MLM    Staphylococcus aureus (BCID) DETECTED (A) NOT DETECTED Final    Comment: Methicillin (oxacillin) susceptible Staphylococcus aureus (MSSA). Preferred therapy is anti staphylococcal beta lactam antibiotic (Cefazolin or Nafcillin), unless clinically contraindicated.  CRITICAL RESULT CALLED TO, READ BACK BY AND VERIFIED WITH: PHARMD M Benton [phone] MLM    Methicillin resistance NOT DETECTED NOT DETECTED Final   Streptococcus species NOT DETECTED NOT DETECTED Final   Streptococcus agalactiae NOT DETECTED NOT DETECTED Final   Streptococcus pneumoniae NOT DETECTED NOT DETECTED Final   Streptococcus pyogenes NOT DETECTED NOT DETECTED Final   Acinetobacter baumannii NOT DETECTED NOT DETECTED Final   Enterobacteriaceae species NOT DETECTED NOT DETECTED Final   Enterobacter cloacae complex NOT DETECTED NOT DETECTED Final   Escherichia coli NOT DETECTED NOT DETECTED Final   Klebsiella oxytoca NOT DETECTED NOT DETECTED Final   Klebsiella pneumoniae NOT DETECTED NOT DETECTED Final   Proteus species NOT DETECTED NOT DETECTED Final   Serratia marcescens NOT DETECTED NOT DETECTED Final   Haemophilus influenzae NOT DETECTED NOT DETECTED Final   Neisseria meningitidis NOT DETECTED NOT DETECTED Final   Pseudomonas aeruginosa NOT DETECTED NOT DETECTED Final   Candida albicans NOT DETECTED NOT DETECTED Final   Candida glabrata NOT DETECTED NOT DETECTED Final   Candida krusei NOT DETECTED NOT DETECTED Final   Candida parapsilosis NOT DETECTED NOT DETECTED Final   Candida tropicalis NOT DETECTED NOT DETECTED Final    Comment: Performed at [facility], [place] [address] [place], [place] [address]  Urine culture     Status: None   Collection Time: 10/01/18  6:34 PM    Specimen: In/Out Cath Urine  Result Value Ref Range Status   Specimen Description   Final    IN/OUT CATH URINE Performed at [facility], [address] [place], [place] [address]    Special Requests   Final    NONE Performed at [facility], [address], [place], [place] [address]    Culture   Final    NO GROWTH Performed at [facility], [place] [address] [place], [place] [address]    Report Status 10/03/2018 FINAL  Final  SARS Coronavirus 2 [facility] order, Performed in [facility] hospital lab) Nasopharyngeal Nasopharyngeal Swab     Status: None   Collection Time: 10/01/18  6:34 PM   Specimen: Nasopharyngeal Swab  Result Value Ref Range Status   SARS Coronavirus 2 NEGATIVE NEGATIVE Final    Comment: (NOTE) If result is NEGATIVE SARS-CoV-2 target nucleic acids are NOT DETECTED. The SARS-CoV-2 RNA is generally detectable in upper and lower  respiratory specimens during the acute phase of infection. The lowest  concentration of SARS-CoV-2 viral copies this assay can detect is 250  copies / mL. A negative result does not preclude SARS-CoV-2 infection  and should not be used as the sole basis for treatment or other  patient management decisions.  A negative result may occur with  improper specimen collection / handling, submission of specimen other  than nasopharyngeal swab, presence of viral mutation(s) within the  areas targeted by this assay, and inadequate number of viral copies  (<250 copies / mL). A negative result must be combined with clinical  observations, patient history, and epidemiological information. If result is POSITIVE SARS-CoV-2 target nucleic acids are DETECTED. The SARS-CoV-2 RNA is generally detectable in upper and lower  respiratory specimens dur ing the acute phase of infection.  Positive  results are indicative of active infection with SARS-CoV-2.  Clinical  correlation with patient history and other diagnostic information is  necessary to  determine patient infection status.  Positive results do  not rule out bacterial infection or co-infection with other viruses. If result is  PRESUMPTIVE POSTIVE SARS-CoV-2 nucleic acids MAY BE PRESENT.   A presumptive positive result was obtained on the submitted specimen  and confirmed on repeat testing.  While 2019 novel coronavirus  (SARS-CoV-2) nucleic acids may be present in the submitted sample  additional confirmatory testing may be necessary for epidemiological  and / or clinical management purposes  to differentiate between  SARS-CoV-2 and other Sarbecovirus currently known to infect humans.  If clinically indicated additional testing with an alternate test  methodology [phone]) is advised. The SARS-CoV-2 RNA is generally  detectable in upper and lower respiratory sp ecimens during the acute  phase of infection. The expected result is Negative. Fact Sheet for Patients:  StrictlyIdeas.no Fact Sheet for Healthcare Providers: BankingDealers.co.za This test is not yet approved or cleared by the [place] FDA and has been authorized for detection and/or diagnosis of SARS-CoV-2 by FDA under an Emergency Use Authorization (EUA).  This EUA will remain in effect (meaning this test can be used) for the duration of the COVID-19 declaration under Section 564(b)(1) of the Act, 21 U.S.C. section 360bbb-3(b)(1), unless the authorization is terminated or revoked sooner. Performed at [facility], [address] [place], [place] [address]   SARS Coronavirus 2 [facility] order, Performed in [facility] hospital lab) Nasopharyngeal     Status: None   Collection Time: 10/02/18  3:00 AM   Specimen: Nasopharyngeal  Result Value Ref Range Status   SARS Coronavirus 2 NEGATIVE NEGATIVE Final    Comment: (NOTE) If result is NEGATIVE SARS-CoV-2 target nucleic acids are NOT DETECTED. The SARS-CoV-2 RNA is generally detectable in upper and lower    respiratory specimens during the acute phase of infection. The lowest  concentration of SARS-CoV-2 viral copies this assay can detect is 250  copies / mL. A negative result does not preclude SARS-CoV-2 infection  and should not be used as the sole basis for treatment or other  patient management decisions.  A negative result may occur with  improper specimen collection / handling, submission of specimen other  than nasopharyngeal swab, presence of viral mutation(s) within the  areas targeted by this assay, and inadequate number of viral copies  (<250 copies / mL). A negative result must be combined with clinical  observations, patient history, and epidemiological information. If result is POSITIVE SARS-CoV-2 target nucleic acids are DETECTED. The SARS-CoV-2 RNA is generally detectable in upper and lower  respiratory specimens dur ing the acute phase of infection.  Positive  results are indicative of active infection with SARS-CoV-2.  Clinical  correlation with patient history and other diagnostic information is  necessary to determine patient infection status.  Positive results do  not rule out bacterial infection or co-infection with other viruses. If result is PRESUMPTIVE POSTIVE SARS-CoV-2 nucleic acids MAY BE PRESENT.   A presumptive positive result was obtained on the submitted specimen  and confirmed on repeat testing.  While 2019 novel coronavirus  (SARS-CoV-2) nucleic acids may be present in the submitted sample  additional confirmatory testing may be necessary for epidemiological  and / or clinical management purposes  to differentiate between  SARS-CoV-2 and other Sarbecovirus currently known to infect humans.  If clinically indicated additional testing with an alternate test  methodology [id number]) is advised. The SARS-CoV-2 RNA is generally  detectable in upper and lower respiratory sp ecimens during the acute  phase of infection. The expected result is Negative. Fact  Sheet for Patients:  StrictlyIdeas.no Fact Sheet for Healthcare Providers: BankingDealers.co.za This test is not yet  approved or cleared by the [place] and has been authorized for detection and/or diagnosis of SARS-CoV-2 by FDA under an Emergency Use Authorization (EUA).  This EUA will remain in effect (meaning this test can be used) for the duration of the COVID-19 declaration under Section 564(b)(1) of the Act, 21 U.S.C. section 360bbb-3(b)(1), unless the authorization is terminated or revoked sooner. Performed at [facility], [place] [address], [place], [place] [address]   MRSA PCR Screening     Status: None   Collection Time: 10/02/18  3:00 AM   Specimen: Nasopharyngeal  Result Value Ref Range Status   MRSA by PCR NEGATIVE NEGATIVE Final    Comment:        The GeneXpert MRSA Assay (FDA approved for NASAL specimens only), is one component of a comprehensive MRSA colonization surveillance program. It is not intended to diagnose MRSA infection nor to guide or monitor treatment for MRSA infections. Performed at [facility], [place] [address] [place] [address] [place], [place] [address]   Culture, blood (Routine X 2) w Reflex to ID Panel     Status: None (Preliminary result)   Collection Time: 10/02/18  1:15 PM   Specimen: BLOOD LEFT HAND  Result Value Ref Range Status   Specimen Description BLOOD LEFT HAND  Final   Special Requests AEROBIC BOTTLE ONLY Blood Culture adequate volume  Final   Culture   Final    NO GROWTH 2 DAYS Performed at [facility], [place] [address] [place], [place] [address]    Report Status PENDING  Incomplete    Studies/Results: Dg Chest 2 View  Result Date: 10/04/2018 CLINICAL DATA:  Evaluate pneumonia EXAM: CHEST - 2 VIEW COMPARISON:  10/03/2018 FINDINGS: There is been interval worsening of bilateral patchy heterogeneous and consolidative airspace disease, conspicuously worse in the right midlung.  There remains a bulky contour of the right mediastinal margin, suspicious for lymphadenopathy. Redemonstrated mild cardiomegaly disc degenerative disease of the thoracic spine. IMPRESSION: There is been interval worsening of bilateral patchy heterogeneous and consolidative airspace disease, conspicuously worse in the right midlung. There remains a bulky contour of the right mediastinal margin, suspicious for lymphadenopathy. Redemonstrated mild cardiomegaly disc degenerative disease of the thoracic spine. Electronically Signed   By: Eddie Candle M.D.   On: 10/04/2018 08:15   Dg Abd 1 View  Result Date: 10/04/2018 CLINICAL DATA:  NG tube placement EXAM: ABDOMEN - 1 VIEW COMPARISON:  None. FINDINGS: The tip of the NG tube is seen projecting over the mid stomach. Air and stool seen throughout the bowel. There is mildly prominent air-filled small bowel loop within the left upper abdomen. IMPRESSION: Tip of NG tube projecting over the mid stomach. Electronically Signed   By: Prudencio Pair M.D.   On: 10/04/2018 13:20   Dg Chest Port 1 View  Result Date: 10/05/2018 CLINICAL DATA:  Pneumonia. EXAM: PORTABLE CHEST 1 VIEW COMPARISON:  06/2018. FINDINGS: Endotracheal tube tip noted just above the carina. Proximal repositioning of approximately 2 cm suggested. NG tube in stable position. Heart size stable. Diffuse severe bilateral pulmonary infiltrates again noted. No interim change. No prominent pleural effusion. No pneumothorax. IMPRESSION: 1. Endotracheal tube tip noted just above the carina. Proximal repositioning of approximately 2 cm suggested. 2.  NG tube in stable position. 3. Diffuse severe bilateral pulmonary infiltrates again noted. No interim change. Electronically Signed   By: Marcello Moores  Register   On: 10/05/2018 07:58   Portable Chest X-ray  Result Date: 10/04/2018 CLINICAL  DATA:  58 year old female intubated. Recently negative for COVID-19 X2. EXAM: PORTABLE CHEST 1 VIEW COMPARISON:  0722 hours today and  earlier. FINDINGS: Portable AP semi upright view at 1219 hours. Endotracheal tube tip at the level the clavicles. Enteric tube placed and courses to the left upper quadrant, tip not included. Stable to slightly lower lung volumes with unchanged Patchy and confluent bilateral pulmonary opacity relatively sparing the apices. No pneumothorax or pleural effusion is evident. Mildly increased obscuration of the mediastinum. Negative visible bowel gas pattern. No acute osseous abnormality identified. IMPRESSION: 1. ETT in good position. Enteric tube courses to the left upper quadrant, tip not included. 2. Ventilation and widespread bilateral airspace opacity not significantly changed. Electronically Signed   By: Genevie Ann M.D.   On: 10/04/2018 12:37      Assessment/Plan:  INTERVAL HISTORY:  Intubated  Febrile overnight  Principal Problem:   Staphylococcus aureus bacteremia with sepsis ([facility]) Active Problems:   Severe sepsis ([name])   Bacteremia due to methicillin susceptible Staphylococcus aureus (MSSA)   Aortic valve endocarditis    Audrey Collier is a 58 y.o. female with methicillin sensitive staph aureus bacteremia and apparent aortic valve endocarditis now intubted and febrile   #1 Fevers: will check Tracheal aspirate cultrue, repepeat blood cultures analyze urine though did not have urinary complaints previously, may consider CT chest if fevers persist  #2 SAB:       [facility] Staphylococcus aureus bacteremia   Staphylococcus aureus bacteremia (SAB) is associated with a high rate of complications and mortality.  Specific aspects of clinical management are critical to optimizing the outcome of patients with SAB.  Therefore, the [facility] Antimicrobial Management Team [facility]) has initiated an intervention aimed at improving the management of SAB at [facility].  To do so, Infectious Diseases physicians are providing an evidence-based consult for the management  of all patients with SAB.     Yes No Comments  Perform follow-up blood cultures (even if the patient is afebrile) to ensure clearance of bacteremia [x]  []    Remove vascular catheter and obtain follow-up blood cultures after the removal of the catheter []  []  DO NOT PLACE LINE UNTIL CLEAR X 5 DAYS repeat cultures  Perform echocardiography to evaluate for endocarditis (transthoracic ECHO is 40-50% sensitive, TEE is > 90% sensitive) [x]  []  TTE shows vegetation  Can get TEE to further elucidate   Consult electrophysiologist to evaluate implanted cardiac device (pacemaker, ICD) []  []    Ensure source control [x]  []  Have all abscesses been drained effectively? Have deep seeded infections (septic joints or osteomyelitis) had appropriate surgical debridement?  Investigate for metastatic sites of infection [x]  []  Does the patient have ANY symptom or physical exam finding that would suggest a deeper infection (back or neck pain that may be suggestive of vertebral osteomyelitis or epidural abscess, muscle pain that could be a symptom of pyomyositis)?  Keep in mind that for deep seeded infections MRI imaging with contrast is preferred rather than other often insensitive tests such as plain x-rays, especially early in a patient's presentation.  Change antibiotic therapy to cefazolin []  []  Beta-lactam antibiotics are preferred for MSSA due to higher cure rates.   If on Vancomycin, goal trough should be 15 - 20 mcg/mL  Estimated duration of IV antibiotic therapy:  6 weeks []  []  Consult case management for probably prolonged outpatient IV antibiotic therapy    #3 Endocarditis Evaluation   Infective endocarditis (IE) is associated with a high rate  of complications and mortality.  Specific aspects of clinical management are critical to optimizing the outcome of patients with IE. Therefore, the Pharmacy Residency Program at [facility] has initiated an evidence-based consult aimed at improving the management of  IE at [facility].     Comments  Consult to ID Outpatient follow-up appointment to be made prior to discharge.  Utilization of ID recommended antibiotic therapy   Surgery Evaluation Cardiothoracic surgery consult if the following indications are present:  - Heart failure associated to valve dysfunction - Endocarditis complicated by heart block or aortic abscess - Left-sided endocarditis caused by S. aureus, fungal, or multi-drug resistant organisms - Left-sided endocarditis with severe valve dysfunction - Large vegetation (>1 cm) on aortic or mitral valve - Persistence of infection 5-7 days after initiation of appropriate antibiotic therapy - Recurrent emboli and persistent vegetations despite appropriate antibiotic therapy - Any history with left-sided embolization with persistent vegetations on TEE  Consult electrophysiologist if presence of implanted cardiac device (pacemaker, ICD)   Consult to cardiology if left ventricular ejection fraction is <40% Evaluation for appropriate heart failure medications upon discharge: - ACE/ARB - Diuretic - Beta blocker  Outpatient follow-up appointment to be made prior to discharge.   Based on the ID MD's evaluation of this patient, the following are recommended to complete a thorough evaluation and care plan to reflect guideline-recommended therapy:     Would get CT surgery consult but likely get TEE when able  Make Outpatient ID appt.    ADDENDUM:  Her changing her to nafcillin in case she is having left-sided embolization to her central nervous system   LOS: 4 days   Audrey Collier 10/05/2018, 11:00 AM

## 2018-10-05 NOTE — Progress Notes (Signed)
Patient transported to CT and back to 2M07. No complications noted.

## 2018-10-06 ENCOUNTER — Inpatient Hospital Stay (HOSPITAL_COMMUNITY): Payer: Medicaid Other

## 2018-10-06 DIAGNOSIS — I339 Acute and subacute endocarditis, unspecified: Secondary | ICD-10-CM

## 2018-10-06 DIAGNOSIS — I350 Nonrheumatic aortic (valve) stenosis: Secondary | ICD-10-CM

## 2018-10-06 DIAGNOSIS — I38 Endocarditis, valve unspecified: Secondary | ICD-10-CM

## 2018-10-06 DIAGNOSIS — I351 Nonrheumatic aortic (valve) insufficiency: Secondary | ICD-10-CM

## 2018-10-06 DIAGNOSIS — R4182 Altered mental status, unspecified: Secondary | ICD-10-CM

## 2018-10-06 DIAGNOSIS — R011 Cardiac murmur, unspecified: Secondary | ICD-10-CM

## 2018-10-06 DIAGNOSIS — I358 Other nonrheumatic aortic valve disorders: Secondary | ICD-10-CM

## 2018-10-06 DIAGNOSIS — J9601 Acute respiratory failure with hypoxia: Secondary | ICD-10-CM

## 2018-10-06 DIAGNOSIS — Z01810 Encounter for preprocedural cardiovascular examination: Secondary | ICD-10-CM

## 2018-10-06 LAB — BLOOD GAS, ARTERIAL
Acid-Base Excess: 3.6 mmol/L — ABNORMAL HIGH (ref 0.0–2.0)
Bicarbonate: 27.9 mmol/L (ref 20.0–28.0)
Drawn by: 51185
FIO2: 40
MECHVT: 340 mL
O2 Saturation: 96.9 %
PEEP: 5 cmH2O
Patient temperature: 100.2
RATE: 24 resp/min
pCO2 arterial: 46.1 mmHg (ref 32.0–48.0)
pH, Arterial: 7.403 (ref 7.350–7.450)
pO2, Arterial: 93.1 mmHg (ref 83.0–108.0)

## 2018-10-06 LAB — CBC WITH DIFFERENTIAL/PLATELET
Abs Immature Granulocytes: 0.34 10*3/uL — ABNORMAL HIGH (ref 0.00–0.07)
Basophils Absolute: 0 10*3/uL (ref 0.0–0.1)
Basophils Relative: 0 %
Eosinophils Absolute: 0 10*3/uL (ref 0.0–0.5)
Eosinophils Relative: 0 %
HCT: 29.3 % — ABNORMAL LOW (ref 36.0–46.0)
Hemoglobin: 9 g/dL — ABNORMAL LOW (ref 12.0–15.0)
Immature Granulocytes: 2 %
Lymphocytes Relative: 7 %
Lymphs Abs: 1 10*3/uL (ref 0.7–4.0)
MCH: 28.4 pg (ref 26.0–34.0)
MCHC: 30.7 g/dL (ref 30.0–36.0)
MCV: 92.4 fL (ref 80.0–100.0)
Monocytes Absolute: 1 10*3/uL (ref 0.1–1.0)
Monocytes Relative: 6 %
Neutro Abs: 12.9 10*3/uL — ABNORMAL HIGH (ref 1.7–7.7)
Neutrophils Relative %: 85 %
Platelets: 64 10*3/uL — ABNORMAL LOW (ref 150–400)
RBC: 3.17 MIL/uL — ABNORMAL LOW (ref 3.87–5.11)
RDW: 15.9 % — ABNORMAL HIGH (ref 11.5–15.5)
WBC: 15.2 10*3/uL — ABNORMAL HIGH (ref 4.0–10.5)
nRBC: 0 % (ref 0.0–0.2)

## 2018-10-06 LAB — GLUCOSE, CAPILLARY
Glucose-Capillary: 106 mg/dL — ABNORMAL HIGH (ref 70–99)
Glucose-Capillary: 114 mg/dL — ABNORMAL HIGH (ref 70–99)
Glucose-Capillary: 116 mg/dL — ABNORMAL HIGH (ref 70–99)
Glucose-Capillary: 131 mg/dL — ABNORMAL HIGH (ref 70–99)
Glucose-Capillary: 89 mg/dL (ref 70–99)
Glucose-Capillary: 91 mg/dL (ref 70–99)

## 2018-10-06 LAB — BASIC METABOLIC PANEL
Anion gap: 8 (ref 5–15)
BUN: 34 mg/dL — ABNORMAL HIGH (ref 6–20)
CO2: 26 mmol/L (ref 22–32)
Calcium: 8.3 mg/dL — ABNORMAL LOW (ref 8.9–10.3)
Chloride: 115 mmol/L — ABNORMAL HIGH (ref 98–111)
Creatinine, Ser: 1.46 mg/dL — ABNORMAL HIGH (ref 0.44–1.00)
GFR calc Af Amer: 46 mL/min — ABNORMAL LOW (ref >60)
GFR calc non Af Amer: 40 mL/min — ABNORMAL LOW (ref >60)
Glucose, Bld: 142 mg/dL — ABNORMAL HIGH (ref 70–99)
Potassium: 3.7 mmol/L (ref 3.5–5.1)
Sodium: 149 mmol/L — ABNORMAL HIGH (ref 135–145)

## 2018-10-06 LAB — PHOSPHORUS: Phosphorus: 3.4 mg/dL (ref 2.5–4.6)

## 2018-10-06 LAB — MAGNESIUM: Magnesium: 2.4 mg/dL (ref 1.7–2.4)

## 2018-10-06 MED ORDER — EPINEPHRINE PF 1 MG/ML IJ SOLN
0.0000 ug/min | INTRAVENOUS | Status: DC
  Filled 2018-10-06: qty 4

## 2018-10-06 MED ORDER — PHENYLEPHRINE HCL-NACL 20-0.9 MG/250ML-% IV SOLN
30.0000 ug/min | INTRAVENOUS | Status: AC
  Administered 2018-10-07: 15 ug/min via INTRAVENOUS
  Filled 2018-10-06: qty 250

## 2018-10-06 MED ORDER — VANCOMYCIN HCL 10 G IV SOLR
1250.0000 mg | INTRAVENOUS | Status: AC
  Administered 2018-10-07: 08:00:00 1250 mg via INTRAVENOUS
  Filled 2018-10-06: qty 1250

## 2018-10-06 MED ORDER — MIDAZOLAM HCL 2 MG/2ML IJ SOLN
2.0000 mg | Freq: Once | INTRAMUSCULAR | Status: AC
  Administered 2018-10-06: 10:00:00 2 mg via INTRAVENOUS
  Filled 2018-10-06: qty 2

## 2018-10-06 MED ORDER — TRANEXAMIC ACID (OHS) BOLUS VIA INFUSION
15.0000 mg/kg | INTRAVENOUS | Status: AC
  Administered 2018-10-07: 09:00:00 1215 mg via INTRAVENOUS
  Filled 2018-10-06: qty 1215

## 2018-10-06 MED ORDER — TRANEXAMIC ACID (OHS) PUMP PRIME SOLUTION
2.0000 mg/kg | INTRAVENOUS | Status: DC
  Filled 2018-10-06: qty 1.62

## 2018-10-06 MED ORDER — SODIUM CHLORIDE 0.9 % IV SOLN
750.0000 mg | INTRAVENOUS | Status: DC
  Filled 2018-10-06: qty 750

## 2018-10-06 MED ORDER — SODIUM CHLORIDE 0.9 % IV SOLN
INTRAVENOUS | Status: DC
  Filled 2018-10-06: qty 30

## 2018-10-06 MED ORDER — POTASSIUM CHLORIDE 2 MEQ/ML IV SOLN
80.0000 meq | INTRAVENOUS | Status: DC
  Filled 2018-10-06: qty 40

## 2018-10-06 MED ORDER — NOREPINEPHRINE 4 MG/250ML-% IV SOLN
0.0000 ug/min | INTRAVENOUS | Status: AC
  Administered 2018-10-07: 2 ug/min via INTRAVENOUS
  Filled 2018-10-06: qty 250

## 2018-10-06 MED ORDER — NITROGLYCERIN IN D5W 200-5 MCG/ML-% IV SOLN
2.0000 ug/min | INTRAVENOUS | Status: DC
  Filled 2018-10-06: qty 250

## 2018-10-06 MED ORDER — DEXMEDETOMIDINE HCL IN NACL 400 MCG/100ML IV SOLN
0.1000 ug/kg/h | INTRAVENOUS | Status: AC
  Administered 2018-10-07: .3 ug/kg/h via INTRAVENOUS
  Filled 2018-10-06: qty 100

## 2018-10-06 MED ORDER — PLASMA-LYTE 148 IV SOLN
INTRAVENOUS | Status: DC
  Filled 2018-10-06: qty 2.5

## 2018-10-06 MED ORDER — VANCOMYCIN HCL 1000 MG IV SOLR
INTRAVENOUS | Status: AC
  Administered 2018-10-07: 10:00:00 1000 mL
  Filled 2018-10-06: qty 1000

## 2018-10-06 MED ORDER — DOPAMINE-DEXTROSE 3.2-5 MG/ML-% IV SOLN
0.0000 ug/kg/min | INTRAVENOUS | Status: DC
  Filled 2018-10-06: qty 250

## 2018-10-06 MED ORDER — MILRINONE LACTATE IN DEXTROSE 20-5 MG/100ML-% IV SOLN
0.3000 ug/kg/min | INTRAVENOUS | Status: DC
  Filled 2018-10-06: qty 100

## 2018-10-06 MED ORDER — SODIUM CHLORIDE 0.9 % IV SOLN
1.5000 g | INTRAVENOUS | Status: AC
  Administered 2018-10-07: 1.5 g via INTRAVENOUS
  Administered 2018-10-07: .75 g via INTRAVENOUS
  Filled 2018-10-06: qty 1.5

## 2018-10-06 MED ORDER — MANNITOL 20 % IV SOLN
Freq: Once | INTRAVENOUS | Status: DC
  Filled 2018-10-06: qty 13

## 2018-10-06 MED ORDER — TRANEXAMIC ACID 1000 MG/10ML IV SOLN
1.5000 mg/kg/h | INTRAVENOUS | Status: AC
  Administered 2018-10-07: 1.5 mg/kg/h via INTRAVENOUS
  Filled 2018-10-06: qty 25

## 2018-10-06 MED ORDER — INSULIN REGULAR(HUMAN) IN NACL 100-0.9 UT/100ML-% IV SOLN
INTRAVENOUS | Status: AC
  Administered 2018-10-07: 1 [IU]/h via INTRAVENOUS
  Filled 2018-10-06: qty 100

## 2018-10-06 NOTE — Progress Notes (Signed)
[name]  has been completed.   Preliminary results in CV Proc.   Audrey Collier 10/06/2018 3:26 PM

## 2018-10-06 NOTE — Progress Notes (Signed)
Subjective: Pt intubated   Antibiotics:  Anti-infectives (From admission, onward)   Start     Dose/Rate Route Frequency Ordered Stop   10/05/18 1300  nafcillin 2 g in sodium chloride 0.9 % 100 mL IVPB     2 g 200 mL/hr over 30 Minutes Intravenous Every 4 hours 10/05/18 1109     10/02/18 1200  ceFAZolin (ANCEF) IVPB 2g/100 mL premix  Status:  Discontinued     2 g 200 mL/hr over 30 Minutes Intravenous Every 8 hours 10/02/18 1040 10/05/18 1109   10/01/18 2000  cefTRIAXone (ROCEPHIN) 2 g in sodium chloride 0.9 % 100 mL IVPB  Status:  Discontinued     2 g 200 mL/hr over 30 Minutes Intravenous Every 24 hours 10/01/18 1958 10/02/18 1040   10/01/18 2000  azithromycin (ZITHROMAX) 500 mg in sodium chloride 0.9 % 250 mL IVPB  Status:  Discontinued     500 mg 250 mL/hr over 60 Minutes Intravenous Every 24 hours 10/01/18 1958 10/02/18 1040      Medications: Scheduled Meds:  calcium-vitamin D  1 tablet Oral Q breakfast   chlorhexidine gluconate (MEDLINE KIT)  15 mL Mouth Rinse BID   Chlorhexidine Gluconate Cloth  6 each Topical Daily   docusate sodium  100 mg Oral Q M,W,F   FLUoxetine  10 mg Per Tube QHS   insulin aspart  2-6 Units Subcutaneous Q4H   levothyroxine  75 mcg Intravenous Daily   mouth rinse  15 mL Mouth Rinse 10 times per day   montelukast  10 mg Per Tube QHS   pantoprazole sodium  40 mg Per Tube Q24H   sodium chloride flush  10-40 mL Intracatheter Q12H   Continuous Infusions:  feeding supplement (VITAL AF 1.2 CAL) 1,000 mL (10/06/18 1340)   fentaNYL infusion INTRAVENOUS 50 mcg/hr (10/06/18 1300)   lactated ringers 50 mL/hr at 10/06/18 1300   nafcillin IV Stopped (10/06/18 1157)   PRN Meds:.acetaminophen, bisacodyl, docusate, fentaNYL, midazolam, midazolam, sodium chloride flush    Objective: Weight change: -1.5 kg  Intake/Output Summary (Last 24 hours) at 10/06/2018 1359 Last data filed at 10/06/2018 1300 Gross per 24 hour  Intake 2236.31  ml  Output 1000 ml  Net 1236.31 ml   Blood pressure 104/71, pulse 83, temperature 100.2 F (37.9 C), temperature source Oral, resp. rate 17, height 5' 5"  (1.651 m), weight 81 kg, last menstrual period 04/16/2013, SpO2 99 %. Temp:  [98.4 F (36.9 C)-100.5 F (38.1 C)] 100.2 F (37.9 C) (08/06 1100) Pulse Rate:  [74-107] 83 (08/06 1330) Resp:  [12-35] 17 (08/06 1330) BP: (85-118)/(56-78) 104/71 (08/06 1330) SpO2:  [94 %-100 %] 99 % (08/06 1330) FiO2 (%):  [40 %] 40 % (08/06 1200) Weight:  [81 kg] 81 kg (08/06 0425)  Physical Exam: General: Alert and awake intubated HEENT: anicteric sclera, EOMI CVS tachy , 2/vi systolic murmur Chest: ,fairly clear Abdomen: soft non-distended,  Extremities: no edema or deformity noted bilaterally Skin: no rashes Neuro: nonfocal  CBC:    BMET Recent Labs    10/05/18 0612 10/06/18 0434  NA 148* 149*  K 3.7 3.7  CL 114* 115*  CO2 25 26  GLUCOSE 135* 142*  BUN 29* 34*  CREATININE 1.12* 1.46*  CALCIUM 8.6* 8.3*     Liver Panel  Recent Labs    10/04/18 1145  PROT 5.3*  ALBUMIN 1.9*  AST 52*  ALT 24  ALKPHOS 72  BILITOT 0.6  Sedimentation Rate No results for input(s): ESRSEDRATE in the last 72 hours. C-Reactive Protein No results for input(s): CRP in the last 72 hours.  Micro Results: Recent Results (from the past 720 hour(s))  Blood Culture (routine x 2)     Status: Abnormal   Collection Time: 10/01/18  6:20 PM   Specimen: BLOOD  Result Value Ref Range Status   Specimen Description   Final    BLOOD RIGHT ANTECUBITAL Performed at [facility], [place] [address], [place], [place] [address]    Special Requests   Final    BOTTLES DRAWN AEROBIC AND ANAEROBIC Blood Culture adequate volume Performed at [facility], [place] [address] [place], [place] [address]    Culture  Setup Time   Final    GRAM POSITIVE COCCI Gram Stain Report Called to,Read Back By and Verified With: PERRY,C. AT 0516 ON 10/02/2018 BY  EVA ANAEROBIC BOTTLE ONLY Performed at [place] Performed at [facility], [address], [place], [place] [address]    Culture (A)  Final    STAPHYLOCOCCUS AUREUS SUSCEPTIBILITIES PERFORMED ON PREVIOUS CULTURE WITHIN THE LAST 5 DAYS. Performed at [facility], [place] [address], [place], [place] [address]    Report Status 10/04/2018 FINAL  Final  Blood Culture (routine x 2)     Status: Abnormal   Collection Time: 10/01/18  6:28 PM   Specimen: BLOOD  Result Value Ref Range Status   Specimen Description BLOOD LEFT ANTECUBITAL  Final   Special Requests   Final    BOTTLES DRAWN AEROBIC AND ANAEROBIC Blood Culture adequate volume   Culture  Setup Time   Final    GRAM POSITIVE COCCI Gram Stain Report Called to,Read Back By and Verified With: PERRY,C. AT 0516 ON 10/02/2018 BY EVA AEROBIC BOTTLE ONLY Performed at [place], READ BACK BY AND VERIFIED WITH: Ailene Rud [id number] MLM Performed at [facility], [place] [address], [place], [place] [address]    Culture STAPHYLOCOCCUS AUREUS (A)  Final   Report Status 10/04/2018 FINAL  Final   Organism ID, Bacteria STAPHYLOCOCCUS AUREUS  Final      Susceptibility   Staphylococcus aureus - MIC*    CIPROFLOXACIN <=0.5 SENSITIVE Sensitive     ERYTHROMYCIN >=8 RESISTANT Resistant     GENTAMICIN <=0.5 SENSITIVE Sensitive     OXACILLIN <=0.25 SENSITIVE Sensitive     TETRACYCLINE <=1 SENSITIVE Sensitive     VANCOMYCIN <=0.5 SENSITIVE Sensitive     TRIMETH/SULFA <=10 SENSITIVE Sensitive     CLINDAMYCIN RESISTANT Resistant     RIFAMPIN <=0.5 SENSITIVE Sensitive     Inducible Clindamycin POSITIVE Resistant     * STAPHYLOCOCCUS AUREUS  Blood Culture ID Panel (Reflexed)     Status: Abnormal   Collection Time: 10/01/18  6:28 PM  Result Value Ref Range Status   Enterococcus species NOT DETECTED NOT DETECTED Final   Listeria  monocytogenes NOT DETECTED NOT DETECTED Final   Staphylococcus species DETECTED (A) NOT DETECTED Final    Comment: CRITICAL RESULT CALLED TO, READ BACK BY AND VERIFIED WITH: PHARMD M MACCIA [id number] MLM    Staphylococcus aureus (BCID) DETECTED (A) NOT DETECTED Final    Comment: Methicillin (oxacillin) susceptible Staphylococcus aureus (MSSA). Preferred therapy is anti staphylococcal beta lactam antibiotic (Cefazolin or Nafcillin), unless clinically contraindicated. CRITICAL RESULT CALLED TO, READ BACK BY AND VERIFIED WITH: PHARMD M  [place] [id number] MLM    Methicillin resistance NOT DETECTED NOT DETECTED Final   Streptococcus species NOT DETECTED NOT DETECTED Final   Streptococcus agalactiae NOT DETECTED NOT DETECTED Final   Streptococcus pneumoniae NOT DETECTED NOT DETECTED Final   Streptococcus pyogenes NOT DETECTED NOT DETECTED Final   Acinetobacter baumannii NOT DETECTED NOT DETECTED Final   Enterobacteriaceae species NOT DETECTED NOT DETECTED Final   Enterobacter cloacae complex NOT DETECTED NOT DETECTED Final   Escherichia coli NOT DETECTED NOT DETECTED Final   Klebsiella oxytoca NOT DETECTED NOT DETECTED Final   Klebsiella pneumoniae NOT DETECTED NOT DETECTED Final   Proteus species NOT DETECTED NOT DETECTED Final   Serratia marcescens NOT DETECTED NOT DETECTED Final   Haemophilus influenzae NOT DETECTED NOT DETECTED Final   Neisseria meningitidis NOT DETECTED NOT DETECTED Final   Pseudomonas aeruginosa NOT DETECTED NOT DETECTED Final   Candida albicans NOT DETECTED NOT DETECTED Final   Candida glabrata NOT DETECTED NOT DETECTED Final   Candida krusei NOT DETECTED NOT DETECTED Final   Candida parapsilosis NOT DETECTED NOT DETECTED Final   Candida tropicalis NOT DETECTED NOT DETECTED Final    Comment: Performed at [facility], [place] [address], [place], [place] [address]  Urine culture     Status: None   Collection Time: 10/01/18  6:34 PM   Specimen: In/Out Cath Urine   Result Value Ref Range Status   Specimen Description   Final    IN/OUT CATH URINE Performed at [facility], [address], [place], [place] [address]    Special Requests   Final    NONE Performed at [facility], [address], [place], [place] [address]    Culture   Final    NO GROWTH Performed at [facility], [place] [address], [place],  [address]    Report Status 10/03/2018 FINAL  Final  SARS Coronavirus 2 [facility] order, Performed in [facility] hospital lab) Nasopharyngeal Nasopharyngeal Swab     Status: None   Collection Time: 10/01/18  6:34 PM   Specimen: Nasopharyngeal Swab  Result Value Ref Range Status   SARS Coronavirus 2 NEGATIVE NEGATIVE Final    Comment: (NOTE) If result is NEGATIVE SARS-CoV-2 target nucleic acids are NOT DETECTED. The SARS-CoV-2 RNA is generally detectable in upper and lower  respiratory specimens during the acute phase of infection. The lowest  concentration of SARS-CoV-2 viral copies this assay can detect is 250  copies / mL. A negative result does not preclude SARS-CoV-2 infection  and should not be used as the sole basis for treatment or other  patient management decisions.  A negative result may occur with  improper specimen collection / handling, submission of specimen other  than nasopharyngeal swab, presence of viral mutation(s) within the  areas targeted by this assay, and inadequate number of viral copies  (<250 copies / mL). A negative result must be combined with clinical  observations, patient history, and epidemiological information. If result is POSITIVE SARS-CoV-2 target nucleic acids are DETECTED. The SARS-CoV-2 RNA is generally detectable in upper and lower  respiratory specimens dur ing the acute phase of infection.  Positive  results are indicative of active infection with SARS-CoV-2.  Clinical  correlation with patient history and other diagnostic information is  necessary to determine patient infection  status.  Positive results do  not rule out bacterial infection or co-infection with other viruses. If result is PRESUMPTIVE POSTIVE SARS-CoV-2 nucleic acids MAY BE PRESENT.   A presumptive  positive result was obtained on the submitted specimen  and confirmed on repeat testing.  While 2019 novel coronavirus  (SARS-CoV-2) nucleic acids may be present in the submitted sample  additional confirmatory testing may be necessary for epidemiological  and / or clinical management purposes  to differentiate between  SARS-CoV-2 and other Sarbecovirus currently known to infect humans.  If clinically indicated additional testing with an alternate test  methodology [id number]) is advised. The SARS-CoV-2 RNA is generally  detectable in upper and lower respiratory sp ecimens during the acute  phase of infection. The expected result is Negative. Fact Sheet for Patients:  StrictlyIdeas.no Fact Sheet for Healthcare Providers: BankingDealers.co.za This test is not yet approved or cleared by the [place] FDA and has been authorized for detection and/or diagnosis of SARS-CoV-2 by FDA under an Emergency Use Authorization (EUA).  This EUA will remain in effect (meaning this test can be used) for the duration of the COVID-19 declaration under Section 564(b)(1) of the Act, 21 U.S.C. section 360bbb-3(b)(1), unless the authorization is terminated or revoked sooner. Performed at [facility], [address], [place], [place] [address]   SARS Coronavirus 2 [facility] order, Performed in [facility] hospital lab) Nasopharyngeal     Status: None   Collection Time: 10/02/18  3:00 AM   Specimen: Nasopharyngeal  Result Value Ref Range Status   SARS Coronavirus 2 NEGATIVE NEGATIVE Final    Comment: (NOTE) If result is NEGATIVE SARS-CoV-2 target nucleic acids are NOT DETECTED. The SARS-CoV-2 RNA is generally detectable in upper and lower  respiratory specimens during the  acute phase of infection. The lowest  concentration of SARS-CoV-2 viral copies this assay can detect is 250  copies / mL. A negative result does not preclude SARS-CoV-2 infection  and should not be used as the sole basis for treatment or other  patient management decisions.  A negative result may occur with  improper specimen collection / handling, submission of specimen other  than nasopharyngeal swab, presence of viral mutation(s) within the  areas targeted by this assay, and inadequate number of viral copies  (<250 copies / mL). A negative result must be combined with clinical  observations, patient history, and epidemiological information. If result is POSITIVE SARS-CoV-2 target nucleic acids are DETECTED. The SARS-CoV-2 RNA is generally detectable in upper and lower  respiratory specimens dur ing the acute phase of infection.  Positive  results are indicative of active infection with SARS-CoV-2.  Clinical  correlation with patient history and other diagnostic information is  necessary to determine patient infection status.  Positive results do  not rule out bacterial infection or co-infection with other viruses. If result is PRESUMPTIVE POSTIVE SARS-CoV-2 nucleic acids MAY BE PRESENT.   A presumptive positive result was obtained on the submitted specimen  and confirmed on repeat testing.  While 2019 novel coronavirus  (SARS-CoV-2) nucleic acids may be present in the submitted sample  additional confirmatory testing may be necessary for epidemiological  and / or clinical management purposes  to differentiate between  SARS-CoV-2 and other Sarbecovirus currently known to infect humans.  If clinically indicated additional testing with an alternate test  methodology [phone]) is advised. The SARS-CoV-2 RNA is generally  detectable in upper and lower respiratory sp ecimens during the acute  phase of infection. The expected result is Negative. Fact Sheet for Patients:   StrictlyIdeas.no Fact Sheet for Healthcare Providers: BankingDealers.co.za This test is not yet approved or cleared by the [place] FDA and has been authorized for  detection and/or diagnosis of SARS-CoV-2 by FDA under an Emergency Use Authorization (EUA).  This EUA will remain in effect (meaning this test can be used) for the duration of the COVID-19 declaration under Section 564(b)(1) of the Act, 21 U.S.C. section 360bbb-3(b)(1), unless the authorization is terminated or revoked sooner. Performed at [facility], [place] [address], [place], [place] [address]   MRSA PCR Screening     Status: None   Collection Time: 10/02/18  3:00 AM   Specimen: Nasopharyngeal  Result Value Ref Range Status   MRSA by PCR NEGATIVE NEGATIVE Final    Comment:        The GeneXpert MRSA Assay (FDA approved for NASAL specimens only), is one component of a comprehensive MRSA colonization surveillance program. It is not intended to diagnose MRSA infection nor to guide or monitor treatment for MRSA infections. Performed at [facility], [place] [address], [place], [place] [address]   Culture, blood (Routine X 2) w Reflex to ID Panel     Status: None (Preliminary result)   Collection Time: 10/02/18  1:15 PM   Specimen: BLOOD LEFT HAND  Result Value Ref Range Status   Specimen Description BLOOD LEFT HAND  Final   Special Requests AEROBIC BOTTLE ONLY Blood Culture adequate volume  Final   Culture   Final    NO GROWTH 3 DAYS Performed at [facility], [place] [address], [place], [place] [address]    Report Status PENDING  Incomplete  Culture, respiratory (non-expectorated)     Status: None (Preliminary result)   Collection Time: 10/05/18 10:35 AM   Specimen: Tracheal Aspirate; Respiratory  Result Value Ref Range Status   Specimen Description TRACHEAL ASPIRATE  Final   Special Requests Normal  Final   Gram Stain   Final    RARE WBC PRESENT,  PREDOMINANTLY MONONUCLEAR NO ORGANISMS SEEN    Culture   Final    CULTURE REINCUBATED FOR BETTER GROWTH Performed at [facility], [address], [place], [place] [address]    Report Status PENDING  Incomplete    Studies/Results: Ct Chest Wo Contrast  Result Date: 10/05/2018 CLINICAL DATA:  Sepsis, possible septic embolism, infective endocarditis EXAM: CT CHEST WITHOUT CONTRAST TECHNIQUE: Multidetector CT imaging of the chest was performed following the standard protocol without IV contrast. COMPARISON:  None. FINDINGS: Cardiovascular: No significant vascular findings. Normal heart size. No pericardial effusion. Mediastinum/Nodes: No enlarged mediastinal, hilar, or axillary lymph nodes. Thyroid gland, trachea, and esophagus demonstrate no significant findings. Lungs/Pleura: Endotracheal tube is position with tip in the midtrachea. There is extensive bilateral, somewhat geographic ground-glass and heterogeneous pulmonary opacity with small bilateral pleural effusions and associated atelectasis or consolidation. Upper Abdomen: The gallbladder is incompletely imaged in the upper abdomen although appears distended, possibly with gallbladder wall thickening (series 3, image 151). Trace perihepatic ascites. Musculoskeletal: No chest wall mass or suspicious bone lesions identified. IMPRESSION: 1. There is extensive bilateral, somewhat geographic ground-glass and heterogeneous pulmonary opacity with small bilateral pleural effusions and associated atelectasis or consolidation. There are no specific features of this airspace disease to suggest septic emboli, particularly evidence of cavitation or discrete nodularity. 2. The gallbladder is incompletely imaged in the upper abdomen although appears distended, possibly with gallbladder wall thickening (series 3, image 151). Trace perihepatic ascites. Consider dedicated gallbladder ultrasound or CT imaging of the abdomen and pelvis if there is clinical concern  for cholecystitis. Electronically Signed   By: Dorna Bloom.D.  On: 10/05/2018 12:53   Dg Chest Port 1 View  Result Date: 10/06/2018 CLINICAL DATA:  Respiratory failure EXAM: PORTABLE CHEST 1 VIEW COMPARISON:  October 05, 2018 FINDINGS: The ET tube is been pulled back and now terminates 2.9 cm above the carina, in good position. The NG tube terminates below today's film. No pneumothorax. No nodules or masses. Infiltrate in the right lung has decreased but remains, particularly in the right base. The cardiomediastinal silhouette is stable. No other changes. IMPRESSION: 1. Persistent but improving infiltrate primarily in the right base. 2. Support apparatus as above. 3. No other changes. Electronically Signed   By: Dorise Bullion III M.D   On: 10/06/2018 08:20   Dg Chest Port 1 View  Result Date: 10/05/2018 CLINICAL DATA:  Pneumonia. EXAM: PORTABLE CHEST 1 VIEW COMPARISON:  06/2018. FINDINGS: Endotracheal tube tip noted just above the carina. Proximal repositioning of approximately 2 cm suggested. NG tube in stable position. Heart size stable. Diffuse severe bilateral pulmonary infiltrates again noted. No interim change. No prominent pleural effusion. No pneumothorax. IMPRESSION: 1. Endotracheal tube tip noted just above the carina. Proximal repositioning of approximately 2 cm suggested. 2.  NG tube in stable position. 3. Diffuse severe bilateral pulmonary infiltrates again noted. No interim change. 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  She has been evaluated by CT surgery and scheduled for valve replacement on the seventh  Principal Problem:   Staphylococcus aureus bacteremia with sepsis [facility]) Active Problems:   Severe sepsis ([place])   Bacteremia due to  methicillin susceptible Staphylococcus aureus (MSSA)   Aortic valve endocarditis    Audrey Collier is a 58 y.o. 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  Greatly appreciate cardiology and cardiothoracic surgery and critical care care for this patient.  We will continue nafcillin, would send valve for culture as well  #2 fevers: Suspect due to severe endocarditis, continue nafcillin at present  We will follow         LOS: 5 days   Alcide Evener 10/06/2018, 1:59 PM

## 2018-10-06 NOTE — Consult Note (Addendum)
Cardiology Consultation:   Patient ID: CYNDIA DEGRAFF MRN: [id number]; DOB: 1960/03/25  Admit date: 10/01/2018 Date of Consult: 10/06/2018  Primary Care Provider: Jani Gravel, MD Primary Cardiologist: Audrey Him, MD  Primary Electrophysiologist:  None    Patient Profile:   Audrey Collier is a 58 y.o. female with a hx of obesity, mild cognitive deficit who lives in a group home, bipolar disorder, and hypothyroidism who is being seen today for the evaluation of endocarditis in the setting of SEPSIS 2/2 MSSA at the request of Dr. Valeta Collier.  History of Present Illness:   Audrey Collier has no previous cardiac history. Unable to get full history due to intubation and mild cognitive deficit.   8/1 patient presented to the ED from a group home. The group had taken a trip to [facility] and just returned. That afternoon patient was found by a coordinator unresponsive in her room and what appeared to be vomit on her shirt. She was brought to the ER by EMS. Fever was 103.8. In the ED patient remained unresponsive. She was found to be SIRS positive with T 101.8, P 95, RR of 32 and WBC 16.3, and source identified with CXR showing BL pneumonia. Patient also hypotensive at BP 88/61. Patient was covid negative x 2. Elevated LDH, Ferritin, CRP of 35.6, Procal 30.36, D Dimer > 20, and fibrinogen of 771. Patient was started on abx and LR 2.5L. BP corrected to SP 107. Also found to have AKI, creatinine 1.52 (baseline 0.80), thrombocytopenia (plts 57), and anemia (Hgb 10.5). LFTS were elevated as well. Blood cultures positive for MSSA an patient was admitted for severe sepsis 2/2 to MSSA. She was placed on 4L O2 for acute respiratory failure. Levothyroxine was continued for Hypothyroidism. Bipolar medications held for lethargy with only Prozac 10 nightly.    Critical care saw patient on 8/2. BP remained soft, 91/59, RR 22. O2 from 4L to 2L. 93-94% O2. IVF for AKI. A blistering rash was noted thought to be DIC vs PNA. 2D  echo showed LVEF 55-60%, midly increased ventricular wall thickness, elevated mean left trail pressure, mild thicking of miltral valve leaflet with calcification, tricuspid aortic valve with mild thickening, in RV inflow view there is suggestion of small density attached to TV, small mobile echogenic denisty attached to aortic valve, Pulmonary HTN, PASP 34 mmHG. TEE was recommended and TEE was scheduled for 8/4 but was canceled because patient was found to be unstable- respiratory rate 36-40 and O2 saturation 90% on 2.5 L. Patient was thrombocytopenic and anemic. Chest x-ray reviewed and showed bilateral infiltrates worse compared to previous. Patient continued to be hypoxic and was transferred to ICU for respiratory failure and was intubated. TEE was performed 8/6 which showed endocarditis; very large (29 x 9 mm), hypermobile vegetation on a trileaflet aortic valve. Other valves were normal. Recommended for Cardiothoracic surgery consult.   Heart Pathway Score:     Past Medical History:  Diagnosis Date  . Anxiety   . Dizziness   . Endometrial polyp 03/26/2014  . Fecal occult blood test positive 03/15/2013   will send 3 cards home and refer to GI for colonoscopy  . GERD (gastroesophageal reflux disease)   . Hypothyroidism   . Mental disorder    mentally challenged  . PMB (postmenopausal bleeding) 03/26/2014  . Thickened endometrium 03/26/2014   ?polyp will get HSG    Past Surgical History:  Procedure Laterality Date  . CATARACT EXTRACTION W/PHACO Left 08/06/2017   Procedure: CATARACT EXTRACTION  PHACO AND INTRAOCULAR LENS PLACEMENT LEFT EYE;  Surgeon: Baruch Goldmann, MD;  Location: [facility];  Service: Ophthalmology;  Laterality: Left;  CDE: 12.63  . CATARACT EXTRACTION W/PHACO Right 10/01/2017   Procedure: CATARACT EXTRACTION PHACO AND INTRAOCULAR LENS PLACEMENT (IOC);  Surgeon: Baruch Goldmann, MD;  Location: [facility];  Service: Ophthalmology;  Laterality: Right;  CDE: 5.77  . COLONOSCOPY N/A 12/01/2013    Dr. Fields:moderate sized internal hemorrhoids/left colon is redundant  . DILITATION & CURRETTAGE/HYSTROSCOPY WITH NOVASURE ABLATION N/A 04/18/2014   Procedure: DILATATION & CURETTAGE/HYSTEROSCOPY WITH NOVASURE ENDOMETRIAL ABLATION;  Surgeon: Florian Buff, MD;  Location: [facility];  Service: Gynecology;  Laterality: N/A;  Uterine Cavity Length 5cm     . ESOPHAGOGASTRODUODENOSCOPY N/A 12/01/2013   [facility]:BEML non erosive gastritis. +H.pylori   . NO PAST SURGERIES    . POLYPECTOMY N/A 04/18/2014   Procedure: ENDOMETRIAL POLYPECTOMY;  Surgeon: Florian Buff, MD;  Location: [facility];  Service: Gynecology;  Laterality: N/A;     Home Medications:  Prior to Admission medications   Medication Sig Start Date End Date Taking? Authorizing Provider  Calcium Citrate-Vitamin D (CALCIUM + D PO) Take 1 tablet by mouth daily.   Yes [provider]  cetirizine (ZYRTEC) 10 MG tablet Take 10 mg by mouth daily.   Yes [provider]  diclofenac sodium (VOLTAREN) 1 % GEL Apply 1 application topically every 6 (six) hours as needed (heel pain).   Yes [provider]  docusate sodium (STOOL SOFTENER) 100 MG capsule Take 100 mg by mouth every Monday, Wednesday, and Friday.   Yes [provider]  FLUoxetine (PROZAC) 10 MG tablet Take 10 mg by mouth at bedtime.    Yes [provider]  fluticasone (FLONASE) 50 MCG/ACT nasal spray Place 1 spray into both nostrils 2 (two) times daily as needed for allergies.    Yes [provider]  levothyroxine (SYNTHROID) 125 MCG tablet Take 125 mcg by mouth daily before breakfast.    Yes [provider]  montelukast (SINGULAIR) 10 MG tablet Take 10 mg by mouth at bedtime.   Yes [provider]  Multiple Vitamin (MULTIVITAMIN) tablet Take 1 tablet by mouth daily.   Yes [provider]  omeprazole (PRILOSEC) 20 MG capsule TAKE 1 CAPSULE BY MOUTH ONCE DAILY. 07/23/15  Yes Annitta Needs, NP  rosuvastatin (CRESTOR) 5 MG  tablet Take 5 mg by mouth daily.   Yes [provider]    Inpatient Medications: Scheduled Meds: . calcium-vitamin D  1 tablet Oral Q breakfast  . chlorhexidine gluconate (MEDLINE KIT)  15 mL Mouth Rinse BID  . Chlorhexidine Gluconate Cloth  6 each Topical Daily  . docusate sodium  100 mg Oral Q M,W,F  . FLUoxetine  10 mg Per Tube QHS  . insulin aspart  2-6 Units Subcutaneous Q4H  . levothyroxine  75 mcg Intravenous Daily  . mouth rinse  15 mL Mouth Rinse 10 times per day  . montelukast  10 mg Per Tube QHS  . pantoprazole sodium  40 mg Per Tube Q24H  . sodium chloride flush  10-40 mL Intracatheter Q12H   Continuous Infusions: . feeding supplement (VITAL AF 1.2 CAL) Stopped (10/06/18 0400)  . fentaNYL infusion INTRAVENOUS 50 mcg/hr (10/06/18 1000)  . lactated ringers 50 mL/hr at 10/06/18 1000  . nafcillin IV 2 g (10/06/18 1127)   PRN Meds: acetaminophen, bisacodyl, docusate, fentaNYL, midazolam, midazolam, sodium chloride flush  Allergies:   No Known Allergies  Social History:  Social History   Socioeconomic History  . Marital status: Single    Spouse name: Not on file  . Number of children: Not on file  . Years of education: Not on file  . Highest education level: Not on file  Occupational History  . Not on file  Social Needs  . Financial resource strain: Not on file  . Food insecurity    Worry: Not on file    Inability: Not on file  . Transportation needs    Medical: Not on file    Non-medical: Not on file  Tobacco Use  . Smoking status: Never Smoker  . Smokeless tobacco: Never Used  . Tobacco comment: Never smoked  Substance and Sexual Activity  . Alcohol use: No    Alcohol/week: 0.0 standard drinks  . Drug use: No  . Sexual activity: Never  Lifestyle  . Physical activity    Days per week: Not on file    Minutes per session: Not on file  . Stress: Not on file  Relationships  . Social Herbalist on phone: Not on file    Gets  together: Not on file    Attends religious service: Not on file    Active member of club or organization: Not on file    Attends meetings of clubs or organizations: Not on file    Relationship status: Not on file  . Intimate partner violence    Fear of current or ex partner: Not on file    Emotionally abused: Not on file    Physically abused: Not on file    Forced sexual activity: Not on file  Other Topics Concern  . Not on file  Social History Narrative  . Not on file    Family History:    Family History  Problem Relation Age of Onset  . Diabetes Maternal Aunt   . Hypertension Maternal Aunt   . Diabetes Maternal Uncle   . Colon cancer Neg Hx      ROS:  Please see the history of present illness.  All other ROS reviewed and negative.     Physical Exam/Data:   Vitals:   10/06/18 0744 10/06/18 0800 10/06/18 0900 10/06/18 1000  BP:  93/70 104/73 105/71  Pulse:  82 84 78  Resp:  14  (!) 22  Temp:      TempSrc:      SpO2: 99% 98% 99% 96%  Weight:      Height:        Intake/Output Summary (Last 24 hours) at 10/06/2018 1130 Last data filed at 10/06/2018 1000 Gross per 24 hour  Intake 2052.58 ml  Output 1000 ml  Net 1052.58 ml   Last 3 Weights 10/06/2018 10/05/2018 10/04/2018  Weight (lbs) 178 lb 9.2 oz 181 lb 14.1 oz 167 lb 8.8 oz  Weight (kg) 81 kg 82.5 kg 76 kg     Body mass index is 29.72 kg/m.  General:   Well developed obese WF  HEENT: normal Neck: no obvious JVD Endocrine:  No thryomegaly Vascular: No carotid bruits; FA pulses 2+ bilaterally without bruits  Cardiac:  normal S1, S2; RRR; no obvious murmur  Lungs: Patient intubated; clear to auscultation bilaterally but difficult to fully assess, no wheezing, rhonchi or rales  Abd: soft, nontender, no hepatomegaly  Ext: no edema Musculoskeletal:  No deformities, BUE and BLE strength normal and equal Skin: warm and dry  Neuro:  CNs 2-12 intact, no focal abnormalities noted Psych: Mild cognitive  deficit; expressive  aphasia   EKG:  The EKG was personally reviewed and demonstrates:  No new 8/1 EKG: NSR, 90bpm, TWI AVL and V2, TW flattening V3-V6, possible RAD Telemetry:  Telemetry was personally reviewed and demonstrates:  NSR, rates 80-90s with max rates up to 120s. Occasional PVCs.   Relevant CV Studies:  Echo TEE 10/06/2018 FINDINGS:  Very large (29 x 9 mm), hypermobile vegetation on a trileaflet aortic valve. 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 The other valves are normal. Normal left ventricular systolic function.  RECOMMENDATIONS:    No hemodynamic indication for aortic valve surgery at this time, but high embolic risk and risk for acute hemodynamic worsening if she develops leaflet perforation.. May need CV surgery evaluation if her respiratory and hematological status allows a major surgical intervention.  Echo 10/02/2018  1. The left ventricle has normal systolic function, with an ejection fraction of 55-60%. The cavity size was normal. There is mildly increased left ventricular wall thickness. Left ventricular diastolic Doppler parameters are indeterminate. Elevated  mean left atrial pressure.  2. The right ventricle has normal systolic function. The cavity was normal. There is no increase in right ventricular wall thickness.  3. Mild thickening of the mitral valve leaflet. Mild calcification of the mitral valve leaflet. There is mild mitral annular calcification present. No evidence of mitral valve stenosis.  4. The aortic valve is tricuspid. Mild thickening of the aortic valve. Mild calcification of the aortic valve. No stenosis of the aortic valve. Mild aortic annular calcification noted.  5. The RV inflow view there is a suggestion of a small density attached to the TV. Limited visualization.  6. There is a small semi mobile echogenic density attached to the aortic valve, appears to be attached to the noncoronary cusp.   7. The aorta is normal in size and structure.  8. The aortic root is normal in size and structure.  9. Pulmonary hypertension is borderline elevated, PASP is 34 mmHg. 10. The interatrial septum was not well visualized. 11. Clear evidence of a small density attached to the aortic valve, in setting of bacteremia this likely represents a vegetation. Possible small density attached to tricuspid valve. Consider TEE to better evaluate.  Laboratory Data:  High Sensitivity Troponin:  No results for input(s): TROPONINIHS in the last 720 hours.   Cardiac EnzymesNo results for input(s): TROPONINI in the last 168 hours. No results for input(s): TROPIPOC in the last 168 hours.  Chemistry Recent Labs  Lab 10/04/18 1145 10/04/18 1320 10/05/18 0612 10/06/18 0434  NA 146* 147* 148* 149*  K 4.2 3.8 3.7 3.7  CL 115*  --  114* 115*  CO2 22  --  25 26  GLUCOSE 116*  --  135* 142*  BUN 27*  --  29* 34*  CREATININE 1.17*  --  1.12* 1.46*  CALCIUM 8.6*  --  8.6* 8.3*  GFRNONAA 52*  --  54* 40*  GFRAA 60*  --  >60 46*  ANIONGAP 9  --  9 8    Recent Labs  Lab 10/02/18 0809 10/03/18 0607 10/04/18 1145  PROT 5.4* 4.9* 5.3*  ALBUMIN 2.4* 2.0* 1.9*  AST 77* 57* 52*  ALT 49* 37 24  ALKPHOS 65 53 72  BILITOT 0.7 0.8 0.6   Hematology Recent Labs  Lab 10/04/18 1145 10/04/18 1320 10/05/18 0612 10/06/18 0434  WBC 5.3  --  8.2 15.2*  RBC 3.36*  --  3.36* 3.17*  HGB 9.3* 9.2* 9.4* 9.0*  HCT 30.0* 27.0* 29.9* 29.3*  MCV 89.3  --  89.0 92.4  MCH 27.7  --  28.0 28.4  MCHC 31.0  --  31.4 30.7  RDW 15.5  --  15.7* 15.9*  PLT 82*  --  62* 64*   BNPNo results for input(s): BNP, PROBNP in the last 168 hours.  DDimer  Recent Labs  Lab 10/01/18 1820 10/02/18 0809  DDIMER >20.00* >20.00*     Radiology/Studies:  Dg Chest 2 View  Result Date: 10/04/2018 CLINICAL DATA:  Evaluate pneumonia EXAM: CHEST - 2 VIEW COMPARISON:  10/03/2018 FINDINGS: There is been interval worsening of bilateral patchy  heterogeneous and consolidative airspace disease, conspicuously worse in the right midlung. There remains a bulky contour of the right mediastinal margin, suspicious for lymphadenopathy. Redemonstrated mild cardiomegaly disc degenerative disease of the thoracic spine. IMPRESSION: There is been interval worsening of bilateral patchy heterogeneous and consolidative airspace disease, conspicuously worse in the right midlung. There remains a bulky contour of the right mediastinal margin, suspicious for lymphadenopathy. Redemonstrated mild cardiomegaly disc degenerative disease of the thoracic spine. Electronically Signed   By: Eddie Candle M.D.   On: 10/04/2018 08:15   Dg Abd 1 View  Result Date: 10/04/2018 CLINICAL DATA:  NG tube placement EXAM: ABDOMEN - 1 VIEW COMPARISON:  None. FINDINGS: The tip of the NG tube is seen projecting over the mid stomach. Air and stool seen throughout the bowel. There is mildly prominent air-filled small bowel loop within the left upper abdomen. IMPRESSION: Tip of NG tube projecting over the mid stomach. Electronically Signed   By: Prudencio Pair M.D.   On: 10/04/2018 13:20   Ct Chest Wo Contrast  Result Date: 10/05/2018 CLINICAL DATA:  Sepsis, possible septic embolism, infective endocarditis EXAM: CT CHEST WITHOUT CONTRAST TECHNIQUE: Multidetector CT imaging of the chest was performed following the standard protocol without IV contrast. COMPARISON:  None. FINDINGS: Cardiovascular: No significant vascular findings. Normal heart size. No pericardial effusion. Mediastinum/Nodes: No enlarged mediastinal, hilar, or axillary lymph nodes. Thyroid gland, trachea, and esophagus demonstrate no significant findings. Lungs/Pleura: Endotracheal tube is position with tip in the midtrachea. There is extensive bilateral, somewhat geographic ground-glass and heterogeneous pulmonary opacity with small bilateral pleural effusions and associated atelectasis or consolidation. Upper Abdomen: The  gallbladder is incompletely imaged in the upper abdomen although appears distended, possibly with gallbladder wall thickening (series 3, image 151). Trace perihepatic ascites. Musculoskeletal: No chest wall mass or suspicious bone lesions identified. IMPRESSION: 1. There is extensive bilateral, somewhat geographic ground-glass and heterogeneous pulmonary opacity with small bilateral pleural effusions and associated atelectasis or consolidation. There are no specific features of this airspace disease to suggest septic emboli, particularly evidence of cavitation or discrete nodularity. 2. The gallbladder is incompletely imaged in the upper abdomen although appears distended, possibly with gallbladder wall thickening (series 3, image 151). Trace perihepatic ascites. Consider dedicated gallbladder ultrasound or CT imaging of the abdomen and pelvis if there is clinical concern for cholecystitis. Electronically Signed   By: Eddie Candle M.D.   On: 10/05/2018 12:53   Dg Chest Port 1 View  Result Date: 10/06/2018 CLINICAL DATA:  Respiratory failure EXAM: PORTABLE CHEST 1 VIEW COMPARISON:  October 05, 2018 FINDINGS: The ET tube is been pulled back and now terminates 2.9 cm above the carina, in good position. The NG tube terminates below today's film. No pneumothorax. No nodules or masses. Infiltrate in the right lung has decreased but  remains, particularly in the right base. The cardiomediastinal silhouette is stable. No other changes. IMPRESSION: 1. Persistent but improving infiltrate primarily in the right base. 2. Support apparatus as above. 3. No other changes. Electronically Signed   By: Dorise Bullion III M.D   On: 10/06/2018 08:20   Dg Chest Port 1 View  Result Date: 10/05/2018 CLINICAL DATA:  Pneumonia. EXAM: PORTABLE CHEST 1 VIEW COMPARISON:  06/2018. FINDINGS: Endotracheal tube tip noted just above the carina. Proximal repositioning of approximately 2 cm suggested. NG tube in stable position. Heart size  stable. Diffuse severe bilateral pulmonary infiltrates again noted. No interim change. No prominent pleural effusion. No pneumothorax. IMPRESSION: 1. Endotracheal tube tip noted just above the carina. Proximal repositioning of approximately 2 cm suggested. 2.  NG tube in stable position. 3. Diffuse severe bilateral pulmonary infiltrates again noted. No interim change. Electronically Signed   By: Marcello Moores  Register   On: 10/05/2018 07:58   Portable Chest X-ray  Result Date: 10/04/2018 CLINICAL DATA:  58 year old female intubated. Recently negative for COVID-19 X2. EXAM: PORTABLE CHEST 1 VIEW COMPARISON:  0722 hours today and earlier. FINDINGS: Portable AP semi upright view at 1219 hours. Endotracheal tube tip at the level the clavicles. Enteric tube placed and courses to the left upper quadrant, tip not included. Stable to slightly lower lung volumes with unchanged Patchy and confluent bilateral pulmonary opacity relatively sparing the apices. No pneumothorax or pleural effusion is evident. Mildly increased obscuration of the mediastinum. Negative visible bowel gas pattern. No acute osseous abnormality identified. IMPRESSION: 1. ETT in good position. Enteric tube courses to the left upper quadrant, tip not included. 2. Ventilation and widespread bilateral airspace opacity not significantly changed. Electronically Signed   By: Genevie Ann M.D.   On: 10/04/2018 12:37   Dg Chest Port 1 View  Result Date: 10/03/2018 CLINICAL DATA:  Shortness of breath, acute respiratory failure with hypoxia. EXAM: PORTABLE CHEST 1 VIEW COMPARISON:  10/02/2018 FINDINGS: Markedly increased airspace densities in the mid and lower left lung. Mild elevation of the right hemidiaphragm. Heart and mediastinum are stable. Again noted is a prominent right paratracheal density. Negative for a pneumothorax. Patient is slightly rotated on this examination. IMPRESSION: Significant new airspace densities in the left lung. Findings are highly concerning  for pneumonia. Prominent right paratracheal density could be related to enlarged azygos vein and vascular congestion but also worrisome for lymphadenopathy. Recommend continued attention to this area on follow up imaging and may need further characterization with chest CT. Electronically Signed   By: Markus Daft M.D.   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 Found to be SIRS positive in ER, WBC 16. COVID negative x 2. CRP 35, Procalcitonin 30.3, DD 20, fibrinogen 771.  Blood cultures positive for MSSA. Planned for TEE 8/4 but found to be hypoxic despite 2.5L O2 and patient was intubated.  - Intubated 8/4 in the ICU - Yesterday febrile, today 98.4.  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 Normal left ventricular systolic function.  - Seen by CV surgery and  plan for surgery tomorrow. - Ct surgery recommended Cardiac CTA. However, rates are 80-90s and creatinine is elevated. Given hypotension would not be able to tolerate the BB and NTG required for adequate study. Defer to MD   Acute hypoxic Respiratory Failure 2/2 MSSA PNA - On full mechanical ventilatory support - per CCM  Thrombocytopenia and anemia in the setting of Sepsis - plts 62 >64 , Hgb range from 9-10 - Monitor CBC  Hypothyroidism - Synthroid home med  AKI in the setting of sepsis - on admission was 1.52. Today 1.46 - Baseline in 2018 reported 0.84 - Continue to trend - Avoid nephrotoxic agents    For questions or updates, please contact Hannah Please consult [email or web address] for contact  info under     Signed, Shakema Surita Ninfa Meeker, PA-C  10/06/2018 11:30 AM

## 2018-10-06 NOTE — Consult Note (Signed)
[address]       [place] [address]             [phone]        Jerald D Lecompte [facility] Medical Record #[id number] Date of Birth: 03-23-1960  Referring: No ref. provider found Primary Care: Jani Gravel, MD Primary Cardiologist:Traci Radford Pax, MD  Chief Complaint:    Chief Complaint  Patient presents with  . Fever    History of Present Illness:     58 yo female admitted on 8/1 with fevers, and altered mental status.  She was noted to have an MSSA bacteremia and infiltrates concerning for pneumonia.  She was intubated on 8/4 due to respiratory failure, and underwent a TEE today which showed a large aortic valve vegetation.     Past Medical and Surgical History: Previous Chest Surgery: no per records Previous Chest Radiation: no per records Diabetes Mellitus: no.  HbA1C pending Creatinine: 1.46  Past Medical History:  Diagnosis Date  . Anxiety   . Dizziness   . Endometrial polyp 03/26/2014  . Fecal occult blood test positive 03/15/2013   will send 3 cards home and refer to GI for colonoscopy  . GERD (gastroesophageal reflux disease)   . Hypothyroidism   . Mental disorder    mentally challenged  . PMB (postmenopausal bleeding) 03/26/2014  . Thickened endometrium 03/26/2014   ?polyp will get HSG    Past Surgical History:  Procedure Laterality Date  . CATARACT EXTRACTION W/PHACO Left 08/06/2017   Procedure: CATARACT EXTRACTION PHACO AND INTRAOCULAR LENS PLACEMENT LEFT EYE;  Surgeon: Baruch Goldmann, MD;  Location: [facility];  Service: Ophthalmology;  Laterality: Left;  CDE: 12.63  . CATARACT EXTRACTION W/PHACO Right 10/01/2017   Procedure: CATARACT EXTRACTION PHACO AND INTRAOCULAR LENS PLACEMENT (IOC);  Surgeon: Baruch Goldmann, MD;  Location: [facility];  Service: Ophthalmology;  Laterality: Right;  CDE: 5.77  . COLONOSCOPY N/A 12/01/2013   Dr. Fields:moderate sized internal hemorrhoids/left colon is redundant  . DILITATION & CURRETTAGE/HYSTROSCOPY WITH  NOVASURE ABLATION N/A 04/18/2014   Procedure: DILATATION & CURETTAGE/HYSTEROSCOPY WITH NOVASURE ENDOMETRIAL ABLATION;  Surgeon: Florian Buff, MD;  Location: [facility];  Service: Gynecology;  Laterality: N/A;  Uterine Cavity Length 5cm     . ESOPHAGOGASTRODUODENOSCOPY N/A 12/01/2013   EXH:BZJI non erosive gastritis. +H.pylori   . NO PAST SURGERIES    . POLYPECTOMY N/A 04/18/2014   Procedure: ENDOMETRIAL POLYPECTOMY;  Surgeon: Florian Buff, MD;  Location: [facility];  Service: Gynecology;  Laterality: N/A;    Social History: Support: developmental delay.  Lives in assisted living facility  Social History   Tobacco Use  Smoking Status Never Smoker  Smokeless Tobacco Never Used  Tobacco Comment   Never smoked    Social History   Substance and Sexual Activity  Alcohol Use No  . Alcohol/week: 0.0 standard drinks     No Known Allergies  Medications: Asprin: no Statin: no Beta Blocker: no Ace Inhibitor: no Anti-Coagulation: no  Current Facility-Administered Medications  Medication Dose Route Frequency Provider Last Rate Last Dose  . acetaminophen (TYLENOL) tablet 650 mg  650 mg Per Tube Q6H PRN Chesley Mires, MD   650 mg at 10/05/18 1004  . bisacodyl (DULCOLAX) suppository 10 mg  10 mg Rectal Daily PRN Chesley Mires, MD      . calcium-vitamin D (OSCAL WITH D) 500-200 MG-UNIT per tablet 1 tablet  1 tablet Oral Q breakfast Zierle-Ghosh, Asia B, DO   1 tablet at  10/06/18 0746  . chlorhexidine gluconate (MEDLINE KIT) (PERIDEX) 0.12 % solution 15 mL  15 mL Mouth Rinse BID Chesley Mires, MD   15 mL at 10/06/18 0747  . Chlorhexidine Gluconate Cloth 2 % PADS 6 each  6 each Topical Daily Icard, Bradley L, DO   6 each at 10/06/18 1125  . docusate (COLACE) 50 MG/5ML liquid 100 mg  100 mg Per Tube BID PRN Chesley Mires, MD   100 mg at 10/05/18 1004  . docusate sodium (COLACE) capsule 100 mg  100 mg Oral Q M,W,F Zierle-Ghosh, Asia B, DO      . feeding supplement (VITAL AF 1.2 CAL) liquid 1,000 mL   1,000 mL Per Tube Continuous Icard, Bradley L, DO   Stopped at 10/06/18 0400  . fentaNYL (SUBLIMAZE) bolus via infusion 50 mcg  50 mcg Intravenous Q15 min PRN Chesley Mires, MD   50 mcg at 10/06/18 1000  . fentaNYL 2567mg in NS 2553m(1049mml) infusion-PREMIX  50-200 mcg/hr Intravenous Continuous SooChesley MiresD 5 mL/hr at 10/06/18 1200 50 mcg/hr at 10/06/18 1200  . FLUoxetine (PROZAC) capsule 10 mg  10 mg Per Tube QHS SooChesley MiresD   10 mg at 10/05/18 2124  . insulin aspart (novoLOG) injection 2-6 Units  2-6 Units Subcutaneous Q4H SooChesley MiresD   4 Units at 10/06/18 0411  . lactated ringers infusion   Intravenous Continuous SooChesley MiresD 50 mL/hr at 10/06/18 1200    . levothyroxine (SYNTHROID, LEVOTHROID) injection 75 mcg  75 mcg Intravenous Daily SamNita SellsD   75 mcg at 10/06/18 0942  . MEDLINE mouth rinse  15 mL Mouth Rinse 10 times per day SooChesley MiresD   15 mL at 10/06/18 1129  . midazolam (VERSED) injection 2 mg  2 mg Intravenous Q15 min PRN SooChesley MiresD      . midazolam (VERSED) injection 2 mg  2 mg Intravenous Q2H PRN SooChesley MiresD      . montelukast (SINGULAIR) tablet 10 mg  10 mg Per Tube QHS SooChesley MiresD   10 mg at 10/05/18 2124  . nafcillin 2 g in sodium chloride 0.9 % 100 mL IVPB  2 g Intravenous Q4H VanTommy MedalorLavell IslamD   Stopped at 10/06/18 1157  . pantoprazole sodium (PROTONIX) 40 mg/20 mL oral suspension 40 mg  40 mg Per Tube Q24H SooChesley MiresD   40 mg at 10/05/18 1200  . sodium chloride flush (NS) 0.9 % injection 10-40 mL  10-40 mL Intracatheter Q12H SooChesley MiresD   10 mL at 10/06/18 0943  . sodium chloride flush (NS) 0.9 % injection 10-40 mL  10-40 mL Intracatheter PRN SooChesley MiresD        Medications Prior to Admission  Medication Sig Dispense Refill Last Dose  . Calcium Citrate-Vitamin D (CALCIUM + D PO) Take 1 tablet by mouth daily.   09/30/2018 at Unknown time  . cetirizine (ZYRTEC) 10 MG tablet Take 10 mg by mouth daily.    09/30/2018 at Unknown time  . diclofenac sodium (VOLTAREN) 1 % GEL Apply 1 application topically every 6 (six) hours as needed (heel pain).   unknown  . docusate sodium (STOOL SOFTENER) 100 MG capsule Take 100 mg by mouth every Monday, Wednesday, and Friday.   09/30/2018 at Unknown time  . FLUoxetine (PROZAC) 10 MG tablet Take 10 mg by mouth at bedtime.    09/30/2018 at Unknown time  . fluticasone (FLONASE) 50 MCG/ACT nasal spray Place  1 spray into both nostrils 2 (two) times daily as needed for allergies.    unknown  . levothyroxine (SYNTHROID) 125 MCG tablet Take 125 mcg by mouth daily before breakfast.    09/30/2018 at Unknown time  . montelukast (SINGULAIR) 10 MG tablet Take 10 mg by mouth at bedtime.   09/30/2018 at Unknown time  . Multiple Vitamin (MULTIVITAMIN) tablet Take 1 tablet by mouth daily.   09/30/2018 at Unknown time  . omeprazole (PRILOSEC) 20 MG capsule TAKE 1 CAPSULE BY MOUTH ONCE DAILY. 30 capsule 5 09/30/2018 at Unknown time  . rosuvastatin (CRESTOR) 5 MG tablet Take 5 mg by mouth daily.   09/30/2018 at Unknown time    Family History  Problem Relation Age of Onset  . Diabetes Maternal Aunt   . Hypertension Maternal Aunt   . Diabetes Maternal Uncle   . Colon cancer Neg Hx      Review of Systems:   Review of Systems  Unable to perform ROS: Severe respiratory distress   Physical Exam: BP 92/61   Pulse 74   Temp 100.2 F (37.9 C) (Oral)   Resp (!) 24   Ht 5' 5"  (1.651 m)   Wt 81 kg   LMP 04/16/2013 (Approximate)   SpO2 97%   BMI 29.72 kg/m  General:  Intubated, stated age.  nontoxic HEENT: NCAT, MMM, OP clear.  edentulous Neuro: arousable, follows commands.  Moves all extremities Cardiovascular: sinus, faint systolic murmur  Pulmonary: Intubated.  40% peep of 5 coarse breath sounds bilaterally Vent Mode: PRVC FiO2 (%):  [40 %] 40 % Set Rate:  [24 bmp] 24 bmp Vt Set:  [340 mL] 340 mL PEEP:  [5 cmH20] 5 cmH20 Pressure Support:  [10 cmH20] 10 cmH20 Plateau  Pressure:  [13 cmH20-19 cmH20] 13 cmH20  GI: NT/ND Extremities: trace peripheral edema     Diagnostic Studies & Laboratory data:    TEE: Large mobile aortic valve vegetation.  Good LV and RV function.      Cultures: - 8/1: Blood MSSA - 8/1: Urine No growth - 8/2: Blood NTD X 3 days - 8/5: Tracheal aspirated - no organism    I have independently reviewed the above radiologic studies and discussed with the patient   Recent Lab Findings: Lab Results  Component Value Date   WBC 15.2 (H) 10/06/2018   HGB 9.0 (L) 10/06/2018   HCT 29.3 (L) 10/06/2018   PLT 64 (L) 10/06/2018   GLUCOSE 142 (H) 10/06/2018   TRIG 159 (H) 10/05/2018   ALT 24 10/04/2018   AST 52 (H) 10/04/2018   NA 149 (H) 10/06/2018   K 3.7 10/06/2018   CL 115 (H) 10/06/2018   CREATININE 1.46 (H) 10/06/2018   BUN 34 (H) 10/06/2018   CO2 26 10/06/2018   INR 1.4 (H) 10/01/2018      Assessment / Plan:    58 yo female with MSSA aortic valve endocarditis, with large mobile vegetation.  Respiratory failure due to pneumonia, but minimal vent settings.  Blood cultures are negative to date.  I agree that she is at high embolic risk given the size and nature of her vegetation.  We will obtain a coronary gate CT today, as well as a baseline head CT.  We will plan on Aortic valve replacement on 10/07/18    I  spent 30 minutes counseling the patient face to face.   Lajuana Matte 10/06/2018 12:34 PM

## 2018-10-06 NOTE — Anesthesia Preprocedure Evaluation (Addendum)
Anesthesia Evaluation  Patient identified by MRN, date of birth, ID band Patient awake    Reviewed: Allergy & Precautions, NPO status , Patient's Chart, lab work & pertinent test results  Airway Mallampati: III  TM Distance: >3 FB Neck ROM: Full    Dental no notable dental hx.    Pulmonary neg pulmonary ROS,    Pulmonary exam normal breath sounds clear to auscultation       Cardiovascular Normal cardiovascular exam+ Valvular Problems/Murmurs AI  Rhythm:Regular Rate:Normal  . The left ventricle has normal systolic function, with an ejection fraction of 60-65%. The cavity size was normal. Left ventricular diastolic parameters were normal. No evidence of left ventricular regional wall motion abnormalities.  2. The right ventricle has normal systolc function. The cavity was normal. There is no increase in right ventricular wall thickness.  3. The aortic valve is tricuspid A mobile Aortic valve vegetation is present on the noncoronary and straddling the noncoronary and right coronary cusps. that measures 3.3 cm x 0.9 cm in size. There is mild aortic insufficiency.  4. The aortic root, ascending aorta, aortic arch and descending aorta are normal in size and structure.  5. No mitral valve vegetation visualized.  FINDINGS  Left Ventricle: The left ventricle has normal systolic function, with an ejection fraction of 60-65%. The cavity size was normal. There is no increase in left ventricular wall thickness. Left ventricular diastolic parameters were normal. No evidence of  left ventricular regional wall motion abnormalities.  Right Ventricle: The right ventricle has normal systolic function. The cavity was normal. There is no increase in right ventricular wall thickness.   Neuro/Psych negative neurological ROS  negative psych ROS   GI/Hepatic Neg liver ROS, GERD  ,  Endo/Other  Hypothyroidism   Renal/GU negative Renal ROS  negative  genitourinary   Musculoskeletal negative musculoskeletal ROS (+)   Abdominal   Peds negative pediatric ROS (+)  Hematology negative hematology ROS (+)   Anesthesia Other Findings   Reproductive/Obstetrics negative OB ROS                             Anesthesia Physical Anesthesia Plan  ASA: IV  Anesthesia Plan: General   Post-op Pain Management:    Induction: Intravenous  PONV Risk Score and Plan: 3 and Ondansetron, Dexamethasone and Treatment may vary due to age or medical condition  Airway Management Planned: Oral ETT  Additional Equipment: Arterial line, PA Cath, TEE and Ultrasound Guidance Line Placement  Intra-op Plan:   Post-operative Plan: Post-operative intubation/ventilation  Informed Consent: I have reviewed the patients History and Physical, chart, labs and discussed the procedure including the risks, benefits and alternatives for the proposed anesthesia with the patient or authorized representative who has indicated his/her understanding and acceptance.     Dental advisory given  Plan Discussed with: CRNA and Surgeon  Anesthesia Plan Comments:         Anesthesia Quick Evaluation

## 2018-10-06 NOTE — Progress Notes (Signed)
INDICATIONS: infective endocarditis  PROCEDURE:   Informed consent was obtained prior to the procedure. The risks, benefits and alternatives for the procedure were discussed and the patient comprehended these risks.  Risks include, but are not limited to, cough, sore throat, vomiting, nausea, somnolence, esophageal and stomach trauma or perforation, bleeding, low blood pressure, aspiration, pneumonia, infection, trauma to the teeth and death.    After a procedural time-out, the oropharynx was anesthetized with 20% benzocaine spray.   During this procedure the patient was administered a total of Versed 2 mg and was already on a Fentanyl IV infusion to achieve and maintain moderate conscious sedation.  The patient's heart rate, blood pressure, and oxygen saturationweare monitored continuously during the procedure. The period of conscious sedation was 16 minutes, of which I was present face-to-face 100% of this time.  The transesophageal probe was inserted in the esophagus and stomach without difficulty and multiple views were obtained.  The patient was kept under observation until the patient left the procedure room.  The patient left the procedure room in stable condition.   Agitated microbubble saline contrast was not administered.  COMPLICATIONS:    There were no immediate complications.  FINDINGS:  Very large (29 x 9 mm), hypermobile vegetation on a trileaflet aortic valve. 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 The other valves are normal. Normal left ventricular systolic function.  RECOMMENDATIONS:    No hemodynamic indication for aortic valve surgery at this time, but high embolic risk and risk for acute hemodynamic worsening if she develops leaflet perforation.. May need CV surgery evaluation if her respiratory and hematological status allows a major surgical intervention.   Time Spent Directly with the  Patient:  30 minutes   Audrey Collier 10/06/2018, 10:09 AM

## 2018-10-06 NOTE — Progress Notes (Signed)
RT NOTE: Patient placed back on full supprt due to having TEE procedure/sedation. Vitals are stable. RT will continue to monitor.

## 2018-10-06 NOTE — Progress Notes (Addendum)
NAME:  Audrey Collier, MRN:  [id number], DOB:  09/23/1960, LOS: 5 ADMISSION DATE:  10/01/2018, CONSULTATION DATE:  10/02/18 REFERRING MD:  Dr. Verlon Au, CHIEF COMPLAINT: Vomiting, fever   Brief History   58 yo female presented with fever, vomiting, weakness.  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  Echo showed vegetation.  Transferred to ICU 8/04 due to respiratory failure.  Past Medical History  Developmental Delay, GERD, Anxiety, Hypothyroidism, [place] [facility] Events   8/01 Admit with fever, vomiting and weakness 8/04 transfer to ICU  Consults:  Cardiology ID  Procedures:     Significant Diagnostic Tests:  Echo 8/02 >> EF 55 to 60%, semi mobile density on aortic valve and tricuspid valve CT chest 10/05/2018 there is extensive bilateral groundglass opacities.  Small bilateral pleural effusions.  Nonspecific features of airspace disease to suggest septic emboli.  No particular evidence of cavitation discrete nodularity Micro Data:  SARS CoV2 8/01 >> negative Blood 8/01 >> MSSA SARS CoV2 8/02 >> negative Blood 8/02 >>   Antimicrobials:  Ancef 8/2 >>   Interim history/subjective:  Remains intubated.  Will check with cardiology concerning possible TEE if not going to be done we will pursue aggressive weaning and extubation.  Objective   Blood pressure 93/70, pulse 82, temperature 98.4 F (36.9 C), temperature source Axillary, resp. rate 14, height 5\' 5"  (1.651 m), weight 81 kg, last menstrual period 04/16/2013, SpO2 98 %.    Vent Mode: PSV;CPAP FiO2 (%):  [40 %] 40 % Set Rate:  [24 bmp] 24 bmp Vt Set:  [340 mL] 340 mL PEEP:  [5 cmH20-8 cmH20] 5 cmH20 Pressure Support:  [10 cmH20] 10 cmH20 Plateau Pressure:  [13 cmH20-19 cmH20] 13 cmH20   Intake/Output Summary (Last 24 hours) at 10/06/2018 0836 Last data filed at 10/06/2018 0800 Gross per 24 hour  Intake 2045.21 ml  Output 1200 ml  Net 845.21 ml   Filed Weights   10/04/18 0631 10/05/18 0500 10/06/18 0425   Weight: 76 kg 82.5 kg 81 kg    Examination:  General: Follows commands despite sedation moves all extremities HEENT: Endotracheal tube and gastric tube are in place Neuro: Follows commands CV: Heart sounds are regular regular rate and rhythm no murmur appreciated PULM: Diminished in the bases GI: soft, bsx4 active  Extremities: warm/dry, 1+ edema  Skin: no rashes or lesions   Resolved Hospital Problem list     Assessment & Plan:   Acute hypoxic respiratory failure from MSSA pneumonia. Continue full mechanical ventilatory support Wean as tolerated Not pursuing aggressive extubation until further input from cardiology concerning TEE We will reach out to cardiology on 10/06/2018 to see through plan to do TEE.  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 Continue antimicrobial therapy Appreciate infectious disease input Cardiology is evaluating for TEE but due to low platelets currently on hold .  Anemia of critical illness. Thrombocytopenia in setting of sepsis. Recent Labs    10/05/18 0612 10/06/18 0434  HGB 9.4* 9.0*  plt 62->64  On 10/06/18  Monitor CBC Transfuse per protocol  Hx of hypothyroidism. Currently on Synthroid   Best practice:  Diet: NPO DVT prophylaxis: SCDs GI prophylaxis: Protonix Mobility: bed rest Code Status: Full Code  Disposition: ICU  Labs    CMP Latest Ref Rng & Units 10/06/2018 10/05/2018 10/04/2018  Glucose 70 - 99 mg/dL 142(H) 135(H) -  BUN 6 - 20 mg/dL 34(H) 29(H) -  Creatinine 0.44 - 1.00 mg/dL 1.46(H)  1.12(H) -  Sodium 135 - 145 mmol/L 149(H) 148(H) 147(H)  Potassium 3.5 - 5.1 mmol/L 3.7 3.7 3.8  Chloride 98 - 111 mmol/L 115(H) 114(H) -  CO2 22 - 32 mmol/L 26 25 -  Calcium 8.9 - 10.3 mg/dL 8.3(L) 8.6(L) -  Total Protein 6.5 - 8.1 g/dL - - -  Total Bilirubin 0.3 - 1.2 mg/dL - - -  Alkaline Phos 38 - 126 U/L - - -  AST 15 - 41 U/L - - -  ALT 0 - 44 U/L - - -   CBC Latest Ref Rng & Units  10/06/2018 10/05/2018 10/04/2018  WBC 4.0 - 10.5 K/uL 15.2(H) 8.2 -  Hemoglobin 12.0 - 15.0 g/dL 9.0(L) 9.4(L) 9.2(L)  Hematocrit 36.0 - 46.0 % 29.3(L) 29.9(L) 27.0(L)  Platelets 150 - 400 K/uL 64(L) 62(L) -   ABG    Component Value Date/Time   PHART 7.292 (L) 10/04/2018 1320   PCO2ART 50.3 (H) 10/04/2018 1320   PO2ART 92.0 10/04/2018 1320   HCO3 24.3 10/04/2018 1320   TCO2 26 10/04/2018 1320   ACIDBASEDEF 2.0 10/04/2018 1320   O2SAT 96.0 10/04/2018 1320    CBG (last 3)  Recent Labs    10/05/18 2341 10/06/18 0347 10/06/18 0731  GLUCAP 123* 131* 106*    App cct 35 min   Brett CanalesSteve Minor ACNP [facility] Pager [phone] till 1 pm If no answer page [phone] 10/06/2018, 8:36 AM    PCCM attending:  58 year old female presented with fever vomiting found to have MSSA bacteremia, MSSA pneumonia, TEE completed today 10/06/2018 which revealed a 29 mm aortic valve vegetation.  Patient remains intubated critically ill.  BP 105/71   Pulse 78   Temp 98.4 F (36.9 C) (Axillary)   Resp (!) 22   Ht 5\' 5"  (1.651 m)   Wt 81 kg   LMP 04/16/2013 (Approximate)   SpO2 96%   BMI 29.72 kg/m   General: Elderly female, intubated on mechanical ventilation critically ill HEENT: NCAT, sclera clear Chest: Regular rate rhythm, S1-S2 Lungs: Bilateral ventilated breath sounds Abdomen: Soft nontender nondistended Extremities: No significant rash  Labs reviewed Echocardiogram 10/06/2018: 29 mm aortic valve vegetation no significant AI  CT chest imaging: Bilateral groundglass opacities no significant nodular cavitary septic emboli appearing lesions. The patient's images have been independently reviewed by me.    Assessment: Acute hypoxemic respiratory failure requiring intubation mechanical ventilation secondary to MSSA pneumonia MSSA bacteremia subsequent infective endocarditis with evidence of aortic valve vegetation seen on transesophageal echocardiogram. Thrombocytopenia likely related to  sepsis and IE   Plan: Due to the large size of seen of the aortic valve vegetation and risk for embolic disease will consult cardiothoracic surgery for any additional recommendations and possible AoV replacement need Appreciate infectious disease input. Continue nafcillin Patient remains on mechanical ventilation continue and tolerate SBT SAT as possible Wean FiO2 and PEEP to maintain SPO2 greater than 90%. Continue tube feeds Holding DVT prophylaxis due to thrombocytopenia Continue GI prophylaxis  This patient is critically ill with multiple organ system failure; which, requires frequent high complexity decision making, assessment, support, evaluation, and titration of therapies. This was completed through the application of advanced monitoring technologies and extensive interpretation of multiple databases. During this encounter critical care time was devoted to patient care services described in this note for 34 minutes.   Josephine IgoBradley L Ori Trejos, DO [facility] 10/06/2018 11:11 AM  Personal pager: [phone] If unanswered, please page CCM On-call: #[phone]

## 2018-10-06 NOTE — Progress Notes (Signed)
RN let me know when walking into room that patient was 20 at lip.  I explained that I did not think that was where she needed to be or where I had charted last night.  Looked in chart and saw she needed to be atleast at 22 at lip, that is what was being charted.  Patient was 20 at lip.  RN assisted me in putting tube back to 22 at lip.  Patient tolerated well, no distress noted, patient sats at 99%.

## 2018-10-07 ENCOUNTER — Inpatient Hospital Stay (HOSPITAL_COMMUNITY): Payer: Medicaid Other

## 2018-10-07 ENCOUNTER — Encounter (HOSPITAL_COMMUNITY)
Admission: EM | Disposition: A | Discharge status: Skilled Nursing Facility | Payer: Self-pay | Source: Home / Self Care | Attending: Thoracic Surgery (Cardiothoracic Vascular Surgery)

## 2018-10-07 ENCOUNTER — Inpatient Hospital Stay (HOSPITAL_COMMUNITY): Payer: Medicaid Other | Admitting: Certified Registered Nurse Anesthetist

## 2018-10-07 DIAGNOSIS — I350 Nonrheumatic aortic (valve) stenosis: Secondary | ICD-10-CM

## 2018-10-07 DIAGNOSIS — Z952 Presence of prosthetic heart valve: Secondary | ICD-10-CM

## 2018-10-07 HISTORY — PX: TEE WITHOUT CARDIOVERSION: SHX5443

## 2018-10-07 HISTORY — PX: AORTIC VALVE REPLACEMENT: SHX41

## 2018-10-07 LAB — CBC WITH DIFFERENTIAL/PLATELET
Abs Immature Granulocytes: 0.55 10*3/uL — ABNORMAL HIGH (ref 0.00–0.07)
Basophils Absolute: 0 10*3/uL (ref 0.0–0.1)
Basophils Relative: 0 %
Eosinophils Absolute: 0.1 10*3/uL (ref 0.0–0.5)
Eosinophils Relative: 0 %
HCT: 29 % — ABNORMAL LOW (ref 36.0–46.0)
Hemoglobin: 9 g/dL — ABNORMAL LOW (ref 12.0–15.0)
Immature Granulocytes: 4 %
Lymphocytes Relative: 7 %
Lymphs Abs: 1.1 10*3/uL (ref 0.7–4.0)
MCH: 28 pg (ref 26.0–34.0)
MCHC: 31 g/dL (ref 30.0–36.0)
MCV: 90.1 fL (ref 80.0–100.0)
Monocytes Absolute: 1 10*3/uL (ref 0.1–1.0)
Monocytes Relative: 7 %
Neutro Abs: 12.3 10*3/uL — ABNORMAL HIGH (ref 1.7–7.7)
Neutrophils Relative %: 82 %
Platelets: 74 10*3/uL — ABNORMAL LOW (ref 150–400)
RBC: 3.22 MIL/uL — ABNORMAL LOW (ref 3.87–5.11)
RDW: 16.3 % — ABNORMAL HIGH (ref 11.5–15.5)
WBC: 15 10*3/uL — ABNORMAL HIGH (ref 4.0–10.5)
nRBC: 0 % (ref 0.0–0.2)

## 2018-10-07 LAB — BASIC METABOLIC PANEL
Anion gap: 10 (ref 5–15)
Anion gap: 8 (ref 5–15)
BUN: 39 mg/dL — ABNORMAL HIGH (ref 6–20)
BUN: 33 mg/dL — ABNORMAL HIGH (ref 6–20)
CO2: 25 mmol/L (ref 22–32)
CO2: 23 mmol/L (ref 22–32)
Calcium: 8.5 mg/dL — ABNORMAL LOW (ref 8.9–10.3)
Calcium: 8.1 mg/dL — ABNORMAL LOW (ref 8.9–10.3)
Chloride: 119 mmol/L — ABNORMAL HIGH (ref 98–111)
Chloride: 119 mmol/L — ABNORMAL HIGH (ref 98–111)
Creatinine, Ser: 1.7 mg/dL — ABNORMAL HIGH (ref 0.44–1.00)
Creatinine, Ser: 1.53 mg/dL — ABNORMAL HIGH (ref 0.44–1.00)
GFR calc Af Amer: 38 mL/min — ABNORMAL LOW (ref >60)
GFR calc Af Amer: 43 mL/min — ABNORMAL LOW (ref >60)
GFR calc non Af Amer: 33 mL/min — ABNORMAL LOW (ref >60)
GFR calc non Af Amer: 37 mL/min — ABNORMAL LOW (ref >60)
Glucose, Bld: 122 mg/dL — ABNORMAL HIGH (ref 70–99)
Glucose, Bld: 141 mg/dL — ABNORMAL HIGH (ref 70–99)
Potassium: 3.5 mmol/L (ref 3.5–5.1)
Potassium: 4.4 mmol/L (ref 3.5–5.1)
Sodium: 154 mmol/L — ABNORMAL HIGH (ref 135–145)
Sodium: 150 mmol/L — ABNORMAL HIGH (ref 135–145)

## 2018-10-07 LAB — POCT I-STAT 4, (NA,K, GLUC, HGB,HCT)
Glucose, Bld: 118 mg/dL — ABNORMAL HIGH (ref 70–99)
Glucose, Bld: 118 mg/dL — ABNORMAL HIGH (ref 70–99)
Glucose, Bld: 103 mg/dL — ABNORMAL HIGH (ref 70–99)
Glucose, Bld: 128 mg/dL — ABNORMAL HIGH (ref 70–99)
Glucose, Bld: 114 mg/dL — ABNORMAL HIGH (ref 70–99)
Glucose, Bld: 108 mg/dL — ABNORMAL HIGH (ref 70–99)
HCT: 24 % — ABNORMAL LOW (ref 36.0–46.0)
HCT: 24 % — ABNORMAL LOW (ref 36.0–46.0)
HCT: 20 % — ABNORMAL LOW (ref 36.0–46.0)
HCT: 23 % — ABNORMAL LOW (ref 36.0–46.0)
HCT: 26 % — ABNORMAL LOW (ref 36.0–46.0)
HCT: 33 % — ABNORMAL LOW (ref 36.0–46.0)
Hemoglobin: 8.2 g/dL — ABNORMAL LOW (ref 12.0–15.0)
Hemoglobin: 8.2 g/dL — ABNORMAL LOW (ref 12.0–15.0)
Hemoglobin: 6.8 g/dL — CL (ref 12.0–15.0)
Hemoglobin: 7.8 g/dL — ABNORMAL LOW (ref 12.0–15.0)
Hemoglobin: 8.8 g/dL — ABNORMAL LOW (ref 12.0–15.0)
Hemoglobin: 11.2 g/dL — ABNORMAL LOW (ref 12.0–15.0)
Potassium: 3.2 mmol/L — ABNORMAL LOW (ref 3.5–5.1)
Potassium: 3.1 mmol/L — ABNORMAL LOW (ref 3.5–5.1)
Potassium: 3.9 mmol/L (ref 3.5–5.1)
Potassium: 3.6 mmol/L (ref 3.5–5.1)
Potassium: 3.2 mmol/L — ABNORMAL LOW (ref 3.5–5.1)
Potassium: 3.5 mmol/L (ref 3.5–5.1)
Sodium: 158 mmol/L — ABNORMAL HIGH (ref 135–145)
Sodium: 156 mmol/L — ABNORMAL HIGH (ref 135–145)
Sodium: 155 mmol/L — ABNORMAL HIGH (ref 135–145)
Sodium: 154 mmol/L — ABNORMAL HIGH (ref 135–145)
Sodium: 156 mmol/L — ABNORMAL HIGH (ref 135–145)
Sodium: 155 mmol/L — ABNORMAL HIGH (ref 135–145)

## 2018-10-07 LAB — POCT I-STAT 7, (LYTES, BLD GAS, ICA,H+H)
Acid-Base Excess: 4 mmol/L — ABNORMAL HIGH (ref 0.0–2.0)
Acid-base deficit: 1 mmol/L (ref 0.0–2.0)
Acid-base deficit: 2 mmol/L (ref 0.0–2.0)
Bicarbonate: 29.3 mmol/L — ABNORMAL HIGH (ref 20.0–28.0)
Bicarbonate: 25 mmol/L (ref 20.0–28.0)
Bicarbonate: 25.1 mmol/L (ref 20.0–28.0)
Calcium, Ion: 1.11 mmol/L — ABNORMAL LOW (ref 1.15–1.40)
Calcium, Ion: 1.22 mmol/L (ref 1.15–1.40)
Calcium, Ion: 1.26 mmol/L (ref 1.15–1.40)
HCT: 18 % — ABNORMAL LOW (ref 36.0–46.0)
HCT: 28 % — ABNORMAL LOW (ref 36.0–46.0)
HCT: 33 % — ABNORMAL LOW (ref 36.0–46.0)
Hemoglobin: 6.1 g/dL — CL (ref 12.0–15.0)
Hemoglobin: 9.5 g/dL — ABNORMAL LOW (ref 12.0–15.0)
Hemoglobin: 11.2 g/dL — ABNORMAL LOW (ref 12.0–15.0)
O2 Saturation: 100 %
O2 Saturation: 100 %
O2 Saturation: 91 %
Patient temperature: 37.1
Potassium: 4.4 mmol/L (ref 3.5–5.1)
Potassium: 3.3 mmol/L — ABNORMAL LOW (ref 3.5–5.1)
Potassium: 3.7 mmol/L (ref 3.5–5.1)
Sodium: 156 mmol/L — ABNORMAL HIGH (ref 135–145)
Sodium: 155 mmol/L — ABNORMAL HIGH (ref 135–145)
Sodium: 155 mmol/L — ABNORMAL HIGH (ref 135–145)
TCO2: 31 mmol/L (ref 22–32)
TCO2: 26 mmol/L (ref 22–32)
TCO2: 27 mmol/L (ref 22–32)
pCO2 arterial: 45.2 mmHg (ref 32.0–48.0)
pCO2 arterial: 44.6 mmHg (ref 32.0–48.0)
pCO2 arterial: 51.6 mmHg — ABNORMAL HIGH (ref 32.0–48.0)
pH, Arterial: 7.419 (ref 7.350–7.450)
pH, Arterial: 7.357 (ref 7.350–7.450)
pH, Arterial: 7.295 — ABNORMAL LOW (ref 7.350–7.450)
pO2, Arterial: 418 mmHg — ABNORMAL HIGH (ref 83.0–108.0)
pO2, Arterial: 202 mmHg — ABNORMAL HIGH (ref 83.0–108.0)
pO2, Arterial: 70 mmHg — ABNORMAL LOW (ref 83.0–108.0)

## 2018-10-07 LAB — POCT I-STAT, CHEM 8
BUN: 32 mg/dL — ABNORMAL HIGH (ref 6–20)
Calcium, Ion: 1.24 mmol/L (ref 1.15–1.40)
Chloride: 119 mmol/L — ABNORMAL HIGH (ref 98–111)
Creatinine, Ser: 1.4 mg/dL — ABNORMAL HIGH (ref 0.44–1.00)
Glucose, Bld: 137 mg/dL — ABNORMAL HIGH (ref 70–99)
HCT: 28 % — ABNORMAL LOW (ref 36.0–46.0)
Hemoglobin: 9.5 g/dL — ABNORMAL LOW (ref 12.0–15.0)
Potassium: 4.5 mmol/L (ref 3.5–5.1)
Sodium: 154 mmol/L — ABNORMAL HIGH (ref 135–145)
TCO2: 25 mmol/L (ref 22–32)

## 2018-10-07 LAB — CBC
HCT: 33.6 % — ABNORMAL LOW (ref 36.0–46.0)
Hemoglobin: 10.4 g/dL — ABNORMAL LOW (ref 12.0–15.0)
MCH: 28.7 pg (ref 26.0–34.0)
MCHC: 31 g/dL (ref 30.0–36.0)
MCV: 92.6 fL (ref 80.0–100.0)
Platelets: 78 10*3/uL — ABNORMAL LOW (ref 150–400)
RBC: 3.63 MIL/uL — ABNORMAL LOW (ref 3.87–5.11)
RDW: 16.5 % — ABNORMAL HIGH (ref 11.5–15.5)
WBC: 16.9 10*3/uL — ABNORMAL HIGH (ref 4.0–10.5)
nRBC: 0.2 % (ref 0.0–0.2)

## 2018-10-07 LAB — ECHO INTRAOPERATIVE TEE
Height: 65 in
Weight: 2962.98 oz

## 2018-10-07 LAB — HEMOGLOBIN AND HEMATOCRIT, BLOOD
HCT: 24.6 % — ABNORMAL LOW (ref 36.0–46.0)
Hemoglobin: 7.6 g/dL — ABNORMAL LOW (ref 12.0–15.0)

## 2018-10-07 LAB — MAGNESIUM: Magnesium: 2.5 mg/dL — ABNORMAL HIGH (ref 1.7–2.4)

## 2018-10-07 LAB — CULTURE, RESPIRATORY W GRAM STAIN
Culture: NORMAL
Special Requests: NORMAL

## 2018-10-07 LAB — PROTIME-INR
INR: 1.7 — ABNORMAL HIGH (ref 0.8–1.2)
Prothrombin Time: 20.1 seconds — ABNORMAL HIGH (ref 11.4–15.2)

## 2018-10-07 LAB — CULTURE, BLOOD (ROUTINE X 2)
Culture: NO GROWTH
Special Requests: ADEQUATE

## 2018-10-07 LAB — GLUCOSE, CAPILLARY
Glucose-Capillary: 105 mg/dL — ABNORMAL HIGH (ref 70–99)
Glucose-Capillary: 100 mg/dL — ABNORMAL HIGH (ref 70–99)
Glucose-Capillary: 90 mg/dL (ref 70–99)
Glucose-Capillary: 99 mg/dL (ref 70–99)
Glucose-Capillary: 103 mg/dL — ABNORMAL HIGH (ref 70–99)

## 2018-10-07 LAB — PREPARE RBC (CROSSMATCH)

## 2018-10-07 LAB — PLATELET COUNT: Platelets: 32 10*3/uL — ABNORMAL LOW (ref 150–400)

## 2018-10-07 LAB — PHOSPHORUS: Phosphorus: 3.7 mg/dL (ref 2.5–4.6)

## 2018-10-07 LAB — APTT: aPTT: 39 seconds — ABNORMAL HIGH (ref 24–36)

## 2018-10-07 SURGERY — REPLACEMENT, AORTIC VALVE, OPEN
Anesthesia: General

## 2018-10-07 MED ORDER — RIFAMPIN ORAL SUSPENSION 25 MG/ML
300.0000 mg | Freq: Three times a day (TID) | ORAL | Status: DC
  Administered 2018-10-07 – 2018-10-08 (×2): 300 mg
  Filled 2018-10-07 (×3): qty 5

## 2018-10-07 MED ORDER — SODIUM CHLORIDE (PF) 0.9 % IJ SOLN
OROMUCOSAL | Status: DC | PRN
  Administered 2018-10-07: 4 mL via TOPICAL

## 2018-10-07 MED ORDER — POTASSIUM CHLORIDE 10 MEQ/50ML IV SOLN
10.0000 meq | INTRAVENOUS | Status: AC
  Administered 2018-10-07 (×3): 10 meq via INTRAVENOUS

## 2018-10-07 MED ORDER — NOREPINEPHRINE 4 MG/250ML-% IV SOLN: 0.0000 ug/min | INTRAVENOUS | Status: DC

## 2018-10-07 MED ORDER — SODIUM CHLORIDE 0.9 % IV SOLN
INTRAVENOUS | Status: DC
  Administered 2018-10-07: 14:00:00 via INTRAVENOUS

## 2018-10-07 MED ORDER — FENTANYL CITRATE (PF) 250 MCG/5ML IJ SOLN
INTRAMUSCULAR | Status: AC
  Filled 2018-10-07: qty 5

## 2018-10-07 MED ORDER — SODIUM CHLORIDE 0.45 % IV SOLN: INTRAVENOUS | Status: DC | PRN

## 2018-10-07 MED ORDER — NITROGLYCERIN IN D5W 200-5 MCG/ML-% IV SOLN: 0.0000 ug/min | INTRAVENOUS | Status: DC

## 2018-10-07 MED ORDER — ALBUMIN HUMAN 5 % IV SOLN: 25.0000 g | Freq: Once | INTRAVENOUS | Status: AC

## 2018-10-07 MED ORDER — ROCURONIUM BROMIDE 10 MG/ML (PF) SYRINGE
PREFILLED_SYRINGE | INTRAVENOUS | Status: DC | PRN
  Administered 2018-10-07 (×3): 50 mg via INTRAVENOUS

## 2018-10-07 MED ORDER — CHLORHEXIDINE GLUCONATE 0.12% ORAL RINSE (MEDLINE KIT)
15.0000 mL | Freq: Two times a day (BID) | OROMUCOSAL | Status: DC
  Administered 2018-10-07 – 2018-10-08 (×2): 15 mL via OROMUCOSAL

## 2018-10-07 MED ORDER — ACETAMINOPHEN 650 MG RE SUPP: 650.0000 mg | Freq: Once | RECTAL | Status: AC

## 2018-10-07 MED ORDER — METOPROLOL TARTRATE 5 MG/5ML IV SOLN
2.5000 mg | INTRAVENOUS | Status: DC | PRN
  Administered 2018-11-29: 01:00:00 2.5 mg via INTRAVENOUS
  Administered 2018-11-29: 04:00:00 5 mg via INTRAVENOUS
  Administered 2018-11-29: 2.5 mg via INTRAVENOUS
  Filled 2018-10-07 (×3): qty 5

## 2018-10-07 MED ORDER — HEMOSTATIC AGENTS (NO CHARGE) OPTIME
TOPICAL | Status: DC | PRN
  Administered 2018-10-07: 1 via TOPICAL

## 2018-10-07 MED ORDER — CHLORHEXIDINE GLUCONATE 0.12 % MT SOLN
15.0000 mL | OROMUCOSAL | Status: AC
  Administered 2018-10-07: 15 mL via OROMUCOSAL

## 2018-10-07 MED ORDER — MIDAZOLAM HCL (PF) 10 MG/2ML IJ SOLN
INTRAMUSCULAR | Status: AC
  Filled 2018-10-07: qty 2

## 2018-10-07 MED ORDER — TRAMADOL HCL 50 MG PO TABS
50.0000 mg | ORAL_TABLET | ORAL | Status: DC | PRN
  Administered 2018-10-15 – 2018-10-18 (×3): 50 mg via ORAL
  Filled 2018-10-07 (×3): qty 1

## 2018-10-07 MED ORDER — METOPROLOL TARTRATE 25 MG/10 ML ORAL SUSPENSION: 12.5000 mg | Freq: Two times a day (BID) | ORAL | Status: DC

## 2018-10-07 MED ORDER — PHENYLEPHRINE 40 MCG/ML (10ML) SYRINGE FOR IV PUSH (FOR BLOOD PRESSURE SUPPORT)
PREFILLED_SYRINGE | INTRAVENOUS | Status: AC
  Filled 2018-10-07: qty 20

## 2018-10-07 MED ORDER — MAGNESIUM SULFATE 4 GM/100ML IV SOLN
4.0000 g | Freq: Once | INTRAVENOUS | Status: AC
  Administered 2018-10-07: 4 g via INTRAVENOUS

## 2018-10-07 MED ORDER — DEXMEDETOMIDINE HCL IN NACL 200 MCG/50ML IV SOLN
0.0000 ug/kg/h | INTRAVENOUS | Status: DC
  Administered 2018-10-07: 0.2 ug/kg/h via INTRAVENOUS
  Administered 2018-10-07: 0.7 ug/kg/h via INTRAVENOUS
  Filled 2018-10-07: qty 50

## 2018-10-07 MED ORDER — LACTATED RINGERS IV SOLN
INTRAVENOUS | Status: DC | PRN
  Administered 2018-10-07: 08:00:00 via INTRAVENOUS

## 2018-10-07 MED ORDER — ACETAMINOPHEN 160 MG/5ML PO SOLN
650.0000 mg | Freq: Once | ORAL | Status: AC
  Administered 2018-10-07: 18:00:00 650 mg

## 2018-10-07 MED ORDER — BISACODYL 5 MG PO TBEC
10.0000 mg | DELAYED_RELEASE_TABLET | Freq: Every day | ORAL | Status: DC
  Administered 2018-10-08 – 2018-10-18 (×7): 10 mg via ORAL
  Administered 2018-10-21: 5 mg via ORAL
  Administered 2018-10-27 – 2018-10-29 (×3): 10 mg via ORAL
  Filled 2018-10-07 (×16): qty 2

## 2018-10-07 MED ORDER — ASPIRIN EC 325 MG PO TBEC
325.0000 mg | DELAYED_RELEASE_TABLET | Freq: Every day | ORAL | Status: DC
  Administered 2018-10-09 – 2018-11-06 (×25): 325 mg via ORAL
  Filled 2018-10-07 (×28): qty 1

## 2018-10-07 MED ORDER — LACTATED RINGERS IV SOLN
500.0000 mL | Freq: Once | INTRAVENOUS | Status: AC | PRN
  Administered 2018-10-08: 500 mL via INTRAVENOUS

## 2018-10-07 MED ORDER — LACTATED RINGERS IV SOLN: INTRAVENOUS | Status: DC

## 2018-10-07 MED ORDER — METOPROLOL TARTRATE 12.5 MG HALF TABLET
12.5000 mg | ORAL_TABLET | Freq: Two times a day (BID) | ORAL | Status: DC
  Administered 2018-10-08 – 2018-10-11 (×7): 12.5 mg via ORAL
  Filled 2018-10-07 (×7): qty 1

## 2018-10-07 MED ORDER — ALBUMIN HUMAN 5 % IV SOLN
INTRAVENOUS | Status: DC | PRN
  Administered 2018-10-07: 11:00:00 via INTRAVENOUS

## 2018-10-07 MED ORDER — FENTANYL CITRATE (PF) 250 MCG/5ML IJ SOLN
INTRAMUSCULAR | Status: DC | PRN
  Administered 2018-10-07 (×2): 50 ug via INTRAVENOUS
  Administered 2018-10-07 (×3): 125 ug via INTRAVENOUS
  Administered 2018-10-07: 50 ug via INTRAVENOUS
  Administered 2018-10-07: 100 ug via INTRAVENOUS
  Administered 2018-10-07: 250 ug via INTRAVENOUS
  Administered 2018-10-07: 100 ug via INTRAVENOUS
  Administered 2018-10-07 (×2): 50 ug via INTRAVENOUS
  Administered 2018-10-07: 125 ug via INTRAVENOUS
  Administered 2018-10-07: 50 ug via INTRAVENOUS
  Administered 2018-10-07: 150 ug via INTRAVENOUS

## 2018-10-07 MED ORDER — FENTANYL CITRATE (PF) 250 MCG/5ML IJ SOLN
INTRAMUSCULAR | Status: AC
  Filled 2018-10-07: qty 25

## 2018-10-07 MED ORDER — SODIUM CHLORIDE 0.9% FLUSH
3.0000 mL | Freq: Two times a day (BID) | INTRAVENOUS | Status: DC
  Administered 2018-10-08 – 2018-12-06 (×85): 3 mL via INTRAVENOUS

## 2018-10-07 MED ORDER — MIDAZOLAM HCL 5 MG/5ML IJ SOLN
INTRAMUSCULAR | Status: DC | PRN
  Administered 2018-10-07: 2 mg via INTRAVENOUS
  Administered 2018-10-07: 3 mg via INTRAVENOUS
  Administered 2018-10-07: 1 mg via INTRAVENOUS

## 2018-10-07 MED ORDER — OXYCODONE HCL 5 MG PO TABS
5.0000 mg | ORAL_TABLET | ORAL | Status: DC | PRN
  Administered 2018-10-10 – 2018-10-27 (×4): 5 mg via ORAL
  Filled 2018-10-07 (×4): qty 1

## 2018-10-07 MED ORDER — DOCUSATE SODIUM 100 MG PO CAPS
200.0000 mg | ORAL_CAPSULE | Freq: Every day | ORAL | Status: DC
  Administered 2018-10-09 – 2018-10-29 (×12): 200 mg via ORAL
  Filled 2018-10-07 (×14): qty 2

## 2018-10-07 MED ORDER — PROTAMINE SULFATE 10 MG/ML IV SOLN
INTRAVENOUS | Status: AC
  Filled 2018-10-07: qty 25

## 2018-10-07 MED ORDER — MORPHINE SULFATE (PF) 2 MG/ML IV SOLN
1.0000 mg | INTRAVENOUS | Status: DC | PRN
  Administered 2018-10-08: 2 mg via INTRAVENOUS
  Administered 2018-11-04 – 2018-11-09 (×2): 1 mg via INTRAVENOUS
  Filled 2018-10-07 (×3): qty 1

## 2018-10-07 MED ORDER — HEPARIN SODIUM (PORCINE) 1000 UNIT/ML IJ SOLN
INTRAMUSCULAR | Status: AC
  Filled 2018-10-07: qty 2

## 2018-10-07 MED ORDER — ACETAMINOPHEN 500 MG PO TABS
1000.0000 mg | ORAL_TABLET | Freq: Four times a day (QID) | ORAL | Status: AC
  Administered 2018-10-07 – 2018-10-12 (×14): 1000 mg via ORAL
  Filled 2018-10-07 (×14): qty 2

## 2018-10-07 MED ORDER — HEPARIN SODIUM (PORCINE) 1000 UNIT/ML IJ SOLN
INTRAMUSCULAR | Status: DC | PRN
  Administered 2018-10-07: 25000 [IU] via INTRAVENOUS

## 2018-10-07 MED ORDER — PHENYLEPHRINE HCL-NACL 20-0.9 MG/250ML-% IV SOLN
0.0000 ug/min | INTRAVENOUS | Status: DC
  Administered 2018-10-07: 50 ug/min via INTRAVENOUS
  Administered 2018-10-08: 40 ug/min via INTRAVENOUS
  Filled 2018-10-07 (×3): qty 250

## 2018-10-07 MED ORDER — FAMOTIDINE IN NACL 20-0.9 MG/50ML-% IV SOLN
20.0000 mg | Freq: Two times a day (BID) | INTRAVENOUS | Status: AC
  Administered 2018-10-07 – 2018-10-08 (×2): 20 mg via INTRAVENOUS
  Filled 2018-10-07: qty 50

## 2018-10-07 MED ORDER — SODIUM CHLORIDE 0.9% IV SOLUTION
Freq: Once | INTRAVENOUS | Status: AC
  Administered 2018-10-14: 01:00:00 via INTRAVENOUS

## 2018-10-07 MED ORDER — ACETAMINOPHEN 160 MG/5ML PO SOLN
1000.0000 mg | Freq: Four times a day (QID) | ORAL | Status: AC
  Administered 2018-10-08 (×3): 1000 mg
  Filled 2018-10-07 (×5): qty 40.6

## 2018-10-07 MED ORDER — MAGNESIUM SULFATE 4 GM/100ML IV SOLN
INTRAVENOUS | Status: AC
  Filled 2018-10-07: qty 100

## 2018-10-07 MED ORDER — INSULIN ASPART 100 UNIT/ML ~~LOC~~ SOLN
0.0000 [IU] | SUBCUTANEOUS | Status: DC
  Administered 2018-10-08 (×2): 2 [IU] via SUBCUTANEOUS

## 2018-10-07 MED ORDER — 0.9 % SODIUM CHLORIDE (POUR BTL) OPTIME
TOPICAL | Status: DC | PRN
  Administered 2018-10-07: 5000 mL

## 2018-10-07 MED ORDER — SODIUM CHLORIDE 0.9% FLUSH
3.0000 mL | INTRAVENOUS | Status: DC | PRN
  Administered 2018-10-22 – 2018-11-03 (×4): 3 mL via INTRAVENOUS
  Filled 2018-10-07 (×4): qty 3

## 2018-10-07 MED ORDER — ASPIRIN 81 MG PO CHEW
324.0000 mg | CHEWABLE_TABLET | Freq: Every day | ORAL | Status: DC
  Administered 2018-10-08 – 2018-10-26 (×3): 324 mg
  Filled 2018-10-07 (×4): qty 4

## 2018-10-07 MED ORDER — SODIUM CHLORIDE 0.9 % IV SOLN: INTRAVENOUS | Status: DC

## 2018-10-07 MED ORDER — INSULIN REGULAR(HUMAN) IN NACL 100-0.9 UT/100ML-% IV SOLN
INTRAVENOUS | Status: DC
  Administered 2018-10-07: 1 [IU]/h via INTRAVENOUS

## 2018-10-07 MED ORDER — BISACODYL 10 MG RE SUPP
10.0000 mg | Freq: Every day | RECTAL | Status: DC
  Administered 2018-10-22: 12:00:00 10 mg via RECTAL
  Filled 2018-10-07 (×2): qty 1

## 2018-10-07 MED ORDER — ONDANSETRON HCL 4 MG/2ML IJ SOLN
4.0000 mg | Freq: Four times a day (QID) | INTRAMUSCULAR | Status: DC | PRN
  Administered 2018-10-10 – 2018-12-05 (×45): 4 mg via INTRAVENOUS
  Filled 2018-10-07 (×48): qty 2

## 2018-10-07 MED ORDER — SODIUM CHLORIDE (PF) 0.9 % IJ SOLN
OROMUCOSAL | Status: DC | PRN
  Administered 2018-10-07: 11:00:00 4 mL via TOPICAL

## 2018-10-07 MED ORDER — PROTAMINE SULFATE 10 MG/ML IV SOLN
INTRAVENOUS | Status: DC | PRN
  Administered 2018-10-07: 10 mg via INTRAVENOUS
  Administered 2018-10-07: 240 mg via INTRAVENOUS

## 2018-10-07 MED ORDER — VANCOMYCIN HCL IN DEXTROSE 1-5 GM/200ML-% IV SOLN
1000.0000 mg | Freq: Once | INTRAVENOUS | Status: AC
  Administered 2018-10-07: 21:00:00 1000 mg via INTRAVENOUS
  Filled 2018-10-07: qty 200

## 2018-10-07 MED ORDER — ALBUMIN HUMAN 5 % IV SOLN
250.0000 mL | INTRAVENOUS | Status: AC | PRN
  Administered 2018-10-07 – 2018-10-08 (×4): 12.5 g via INTRAVENOUS
  Filled 2018-10-07 (×2): qty 500

## 2018-10-07 MED ORDER — PROPOFOL 10 MG/ML IV BOLUS
INTRAVENOUS | Status: AC
  Filled 2018-10-07: qty 20

## 2018-10-07 MED ORDER — ORAL CARE MOUTH RINSE
15.0000 mL | OROMUCOSAL | Status: DC
  Administered 2018-10-07 – 2018-10-08 (×8): 15 mL via OROMUCOSAL

## 2018-10-07 MED ORDER — EPHEDRINE 5 MG/ML INJ
INTRAVENOUS | Status: AC
  Filled 2018-10-07: qty 10

## 2018-10-07 MED ORDER — MIDAZOLAM HCL 2 MG/2ML IJ SOLN: 2.0000 mg | INTRAMUSCULAR | Status: DC | PRN

## 2018-10-07 MED ORDER — PANTOPRAZOLE SODIUM 40 MG PO TBEC
40.0000 mg | DELAYED_RELEASE_TABLET | Freq: Every day | ORAL | Status: DC
  Administered 2018-10-09 – 2018-11-05 (×26): 40 mg via ORAL
  Filled 2018-10-07 (×26): qty 1

## 2018-10-07 MED ORDER — ROCURONIUM BROMIDE 10 MG/ML (PF) SYRINGE
PREFILLED_SYRINGE | INTRAVENOUS | Status: AC
  Filled 2018-10-07: qty 10

## 2018-10-07 MED ORDER — SODIUM CHLORIDE 0.9 % IV SOLN
250.0000 mL | INTRAVENOUS | Status: DC
  Administered 2018-10-07: 250 mL via INTRAVENOUS

## 2018-10-07 SURGICAL SUPPLY — 78 items
ADAPTER CARDIO PERF ANTE/RETRO (ADAPTER) ×3 IMPLANT
ADPR PRFSN 84XANTGRD RTRGD (ADAPTER) ×1
BAG DECANTER FOR FLEXI CONT (MISCELLANEOUS) ×3 IMPLANT
BLADE CLIPPER SURG (BLADE) ×3 IMPLANT
BLADE STERNUM SYSTEM 6 (BLADE) ×3 IMPLANT
BLADE SURG 15 STRL LF DISP TIS (BLADE) ×1 IMPLANT
BLADE SURG 15 STRL SS (BLADE) ×3
CANISTER SUCT 3000ML PPV (MISCELLANEOUS) ×3 IMPLANT
CANNULA GUNDRY RCSP 15FR (MISCELLANEOUS) ×3 IMPLANT
CATH CPB KIT HENDRICKSON (MISCELLANEOUS) ×3 IMPLANT
CATH HEART VENT LEFT (CATHETERS) ×1 IMPLANT
CATH ROBINSON RED A/P 18FR (CATHETERS) ×9 IMPLANT
CONN ST 1/4X3/8  BEN (MISCELLANEOUS) ×8
CONN ST 1/4X3/8 BEN (MISCELLANEOUS) ×2 IMPLANT
CONT SPEC 4OZ CLIKSEAL STRL BL (MISCELLANEOUS) ×7 IMPLANT
COVER SURGICAL LIGHT HANDLE (MISCELLANEOUS) ×3 IMPLANT
COVER WAND RF STERILE (DRAPES) ×1 IMPLANT
DEVICE SUT CK QUICK LOAD MINI (Prosthesis & Implant Heart) ×2 IMPLANT
DRAIN CHANNEL 28F RND 3/8 FF (WOUND CARE) ×6 IMPLANT
DRAPE CARDIOVASCULAR INCISE (DRAPES) ×3
DRAPE SLUSH/WARMER DISC (DRAPES) ×3 IMPLANT
DRAPE SRG 135X102X78XABS (DRAPES) ×1 IMPLANT
DRSG AQUACEL AG ADV 3.5X10 (GAUZE/BANDAGES/DRESSINGS) ×2 IMPLANT
DRSG COVADERM 4X14 (GAUZE/BANDAGES/DRESSINGS) ×3 IMPLANT
ELECT CAUTERY BLADE 6.4 (BLADE) ×3 IMPLANT
ELECT REM PT RETURN 9FT ADLT (ELECTROSURGICAL) ×6
ELECTRODE REM PT RTRN 9FT ADLT (ELECTROSURGICAL) ×2 IMPLANT
FELT TEFLON 1X6 (MISCELLANEOUS) ×6 IMPLANT
GAUZE SPONGE 4X4 12PLY STRL (GAUZE/BANDAGES/DRESSINGS) ×3 IMPLANT
GAUZE SPONGE 4X4 12PLY STRL LF (GAUZE/BANDAGES/DRESSINGS) ×2 IMPLANT
GLOVE BIO SURGEON STRL SZ 6 (GLOVE) IMPLANT
GLOVE BIO SURGEON STRL SZ 6.5 (GLOVE) ×1 IMPLANT
GLOVE BIO SURGEON STRL SZ7 (GLOVE) ×9 IMPLANT
GLOVE BIO SURGEON STRL SZ7.5 (GLOVE) ×2 IMPLANT
GLOVE BIO SURGEONS STRL SZ 6.5 (GLOVE) ×1
GLOVE BIOGEL PI IND STRL 6.5 (GLOVE) IMPLANT
GLOVE BIOGEL PI IND STRL 7.0 (GLOVE) IMPLANT
GLOVE BIOGEL PI INDICATOR 6.5 (GLOVE) ×2
GLOVE BIOGEL PI INDICATOR 7.0 (GLOVE) ×2
GLOVE ECLIPSE 7.0 STRL STRAW (GLOVE) ×2 IMPLANT
GLOVE EUDERMIC 7 POWDERFREE (GLOVE) ×2 IMPLANT
GLOVE ORTHO TXT STRL SZ7.5 (GLOVE) ×2 IMPLANT
GOWN STRL REUS W/ TWL LRG LVL3 (GOWN DISPOSABLE) ×4 IMPLANT
GOWN STRL REUS W/ TWL XL LVL3 (GOWN DISPOSABLE) ×2 IMPLANT
GOWN STRL REUS W/TWL LRG LVL3 (GOWN DISPOSABLE) ×15
GOWN STRL REUS W/TWL XL LVL3 (GOWN DISPOSABLE) ×6
HEMOSTAT POWDER SURGIFOAM 1G (HEMOSTASIS) ×7 IMPLANT
HEMOSTAT SURGICEL 2X14 (HEMOSTASIS) ×3 IMPLANT
KIT BASIN OR (CUSTOM PROCEDURE TRAY) ×3 IMPLANT
KIT COMBO MINI 4X17COR-KNOT (Prosthesis & Implant Heart) ×1 IMPLANT
KIT SUCTION CATH 14FR (SUCTIONS) ×1 IMPLANT
KIT SUT CK MINI COMBO 4X17 (Prosthesis & Implant Heart) ×1 IMPLANT
KIT TURNOVER KIT B (KITS) ×3 IMPLANT
LINE VENT (MISCELLANEOUS) ×2 IMPLANT
LOAD QUICK .035MM CK MINI (Prosthesis & Implant Heart) ×2 IMPLANT
NS IRRIG 1000ML POUR BTL (IV SOLUTION) ×18 IMPLANT
PACK E OPEN HEART (SUTURE) ×3 IMPLANT
PACK OPEN HEART (CUSTOM PROCEDURE TRAY) ×3 IMPLANT
PAD ARMBOARD 7.5X6 YLW CONV (MISCELLANEOUS) ×6 IMPLANT
POSITIONER HEAD DONUT 9IN (MISCELLANEOUS) ×3 IMPLANT
SET CARDIOPLEGIA MPS 5001102 (MISCELLANEOUS) ×2 IMPLANT
SUT BONE WAX W31G (SUTURE) ×3 IMPLANT
SUT ETHIBON 2 0 V 52N 30 (SUTURE) ×6 IMPLANT
SUT ETHIBON EXCEL 2-0 V-5 (SUTURE) ×2 IMPLANT
SUT PROLENE 3 0 SH DA (SUTURE) IMPLANT
SUT PROLENE 3 0 SH1 36 (SUTURE) ×3 IMPLANT
SUT PROLENE 4 0 RB 1 (SUTURE) ×15
SUT PROLENE 4-0 RB1 .5 CRCL 36 (SUTURE) ×3 IMPLANT
SUT STEEL 6MS V (SUTURE) ×4 IMPLANT
SYSTEM SAHARA CHEST DRAIN ATS (WOUND CARE) ×3 IMPLANT
TAPE CLOTH SURG 4X10 WHT LF (GAUZE/BANDAGES/DRESSINGS) ×2 IMPLANT
TOWEL GREEN STERILE (TOWEL DISPOSABLE) ×3 IMPLANT
TOWEL GREEN STERILE FF (TOWEL DISPOSABLE) ×3 IMPLANT
TRAY FOLEY SLVR 16FR TEMP STAT (SET/KITS/TRAYS/PACK) ×3 IMPLANT
UNDERPAD 30X30 (UNDERPADS AND DIAPERS) ×3 IMPLANT
VALVE AORTIC SZ21 INSP/RESIL (Valve) ×2 IMPLANT
VENT LEFT HEART 12002 (CATHETERS) ×3
WATER STERILE IRR 1000ML POUR (IV SOLUTION) ×6 IMPLANT

## 2018-10-07 NOTE — Progress Notes (Signed)
Upon assessment, extensive bruising noticed on ear. Wound consult ordered.

## 2018-10-07 NOTE — Anesthesia Postprocedure Evaluation (Signed)
Anesthesia Post Note  Patient: Audrey Collier  Procedure(Collier) Performed: AORTIC VALVE REPLACEMENT (AVR) (N/A ) TRANSESOPHAGEAL ECHOCARDIOGRAM (TEE) (N/A )     Patient location during evaluation: SICU Anesthesia Type: General Level of consciousness: sedated Pain management: pain level controlled Vital Signs Assessment: post-procedure vital signs reviewed and stable Respiratory status: patient remains intubated per anesthesia plan Cardiovascular status: stable Postop Assessment: no apparent nausea or vomiting Anesthetic complications: no    Last Vitals:  Vitals:   10/07/18 1415 10/07/18 1430  BP:  101/74  Pulse: 95 100  Resp: 12 12  Temp: 37.1 C 37.1 C  SpO2: 99% 97%    Last Pain:  Vitals:   10/07/18 1300  TempSrc: Core  PainSc:                  Audrey Collier

## 2018-10-07 NOTE — Progress Notes (Signed)
Took patient on ventilator to CT and back without any complications.  Suctioned before and after, no distress noted.

## 2018-10-07 NOTE — Anesthesia Procedure Notes (Signed)
Arterial Line Insertion Start/End8/08/2018 7:45 AM, 10/07/2018 7:50 AM Performed by: Shirlyn Goltz, CRNA, CRNA  Patient location: Pre-op. Preanesthetic checklist: patient identified, IV checked, site marked, risks and benefits discussed, surgical consent, monitors and equipment checked, pre-op evaluation, timeout performed and anesthesia consent Lidocaine 1% used for infiltration Left, radial was placed Catheter size: 20 Fr Hand hygiene performed  and maximum sterile barriers used   Attempts: 1 Procedure performed without using ultrasound guided technique. Following insertion, dressing applied. Post procedure assessment: normal and unchanged

## 2018-10-07 NOTE — Progress Notes (Signed)
  Echocardiogram Echocardiogram Transesophageal has been performed.  Audrey Collier 10/07/2018, 9:03 AM

## 2018-10-07 NOTE — Anesthesia Procedure Notes (Signed)
Central Venous Catheter Insertion Performed by: Myrtie Soman, MD, anesthesiologist Start/End8/08/2018 7:44 AM, 10/07/2018 8:02 AM Patient location: Pre-op. Preanesthetic checklist: patient identified, IV checked, site marked, risks and benefits discussed, surgical consent, monitors and equipment checked, pre-op evaluation, timeout performed and anesthesia consent Position: Trendelenburg Lidocaine 1% used for infiltration and patient sedated Hand hygiene performed  and maximum sterile barriers used  Catheter size: 8.5 Fr Central line was placed.Sheath introducer Swan type:thermodilution PA Cath depth:40 Procedure performed using ultrasound guided technique. Ultrasound Notes:anatomy identified, needle tip was noted to be adjacent to the nerve/plexus identified, no ultrasound evidence of intravascular and/or intraneural injection and image(s) printed for medical record Attempts: 1 Following insertion, line sutured, dressing applied and Biopatch. Post procedure assessment: blood return through all ports, free fluid flow and no air  Patient tolerated the procedure well with no immediate complications.

## 2018-10-07 NOTE — Progress Notes (Signed)
Patient taken to OR at this time via surgical team. 160 ml of fentanyl wasted with Anson Crofts RN

## 2018-10-07 NOTE — Progress Notes (Signed)
     [address]       [place] [address]             [phone]      No events overnight. 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  Audrey Collier Bary Leriche

## 2018-10-07 NOTE — Progress Notes (Addendum)
NAME:  Audrey Collier, MRN:  [id number], DOB:  [birth year], LOS: 6 ADMISSION DATE:  10/01/2018, CONSULTATION DATE:  10/02/18 REFERRING MD:  Dr. Verlon Au, CHIEF COMPLAINT: Vomiting, fever   Brief History   58 yo female presented with fever, vomiting, weakness.  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  Echo showed vegetation.  Transferred to ICU 8/04 due to respiratory failure.  Past Medical History  Developmental Delay, GERD, Anxiety, Hypothyroidism, [facility] Events   8/01 Admit with fever, vomiting and weakness 8/04 transfer to ICU  Consults:  Cardiology ID  Procedures:     Significant Diagnostic Tests:  Echo 8/02 >> EF 55 to 60%, semi mobile density on aortic valve and tricuspid valve CT chest 10/05/2018 there is extensive bilateral groundglass opacities.  Small bilateral pleural effusions.  Nonspecific features of airspace disease to suggest septic emboli.  No particular evidence of cavitation discrete nodularity 2020 TEE large mobile vegetation on aortic valve with leaflet dysfunction Micro Data:  SARS CoV2 8/01 >> negative Blood 8/01 >> MSSA SARS CoV2 8/02 >> negative Blood 8/02 >> MSSA   Antimicrobials:  Ancef 8/2 >> 10/05/2018 Nafcillin 10/05/2018>> Vancomycin and Zinacef for prophylaxis surgery 10/07/1998 Interim history/subjective:  Large mildly vegetation on aortic valve going to open the operating room 10/07/2018 for aortic valve replacement will most likely return to the open-heart unit post surgery.  Objective   Blood pressure (!) 132/92, pulse 93, temperature 98.2 F (36.8 C), temperature source Oral, resp. rate (!) 28, height 5\' 5"  (1.651 m), weight 84 kg, last menstrual period 04/16/2013, SpO2 95 %.    Vent Mode: PRVC FiO2 (%):  [40 %] 40 % Set Rate:  [24 bmp] 24 bmp Vt Set:  [340 mL] 340 mL PEEP:  [5 cmH20] 5 cmH20 Plateau Pressure:  [12 cmH20-22 cmH20] 12 cmH20   Intake/Output Summary (Last 24 hours) at 10/07/2018 0826 Last data filed at  10/07/2018 0600 Gross per 24 hour  Intake 1531.73 ml  Output 900 ml  Net 631.73 ml   Filed Weights   10/05/18 0500 10/06/18 0425 10/07/18 0500  Weight: 82.5 kg 81 kg 84 kg    Examination:  General: Female who appears older than her stated age sedated on full mechanical ventilatory support HEENT: Endotracheal tube gastric tube in place Neuro: Sedated on vent CV: Sounds regular rate and rhythm no murmurs appreciated PULM: Diminished in the bases GI: soft, bsx4 active  Extremities: warm/dry, 1+ edema  Skin: no rashes or lesions    Resolved Hospital Problem list     Assessment & Plan:   Acute hypoxic respiratory failure from MSSA pneumonia. We will continue full mechanical ventilatory support and she is going to operating room for aortic valve replacement.  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  Large mobile vegetation on aortic valve with aortic valve replacement scheduled on 10/07/2018 Infectious diseases following currently on nafcillin Noted to have hypermobile vegetation on aortic valve and to have aortic valve replacement today. Most likely she will return to the open heart unit on 2H post surgery .  Anemia of critical illness. Thrombocytopenia in setting of sepsis. Recent Labs    10/06/18 0434 10/07/18 0309  HGB 9.0* 9.0*  plt 62->64  On 10/06/18  Monitor CBC Transfuse per protocol note to have aortic valve replacement on 10/07/2018  Hx of hypothyroidism. Currently on IV Synthroid   Best practice:  Diet: NPO DVT prophylaxis: SCDs GI prophylaxis: Protonix Mobility: bed rest Code Status:  Full Code  Disposition: ICU, taken to the OR 10/07/2018 for aortic valve replacement  Labs    CMP Latest Ref Rng & Units 10/07/2018 10/06/2018 10/05/2018  Glucose 70 - 99 mg/dL 540(J122(H) 811(B142(H) 147(W135(H)  BUN 6 - 20 mg/dL 29(F39(H) 62(Z34(H) 30(Q29(H)  Creatinine 0.44 - 1.00 mg/dL 6.57(Q1.70(H) 4.69(G1.46(H) 2.95(M1.12(H)  Sodium 135 - 145 mmol/L 154(H) 149(H) 148(H)   Potassium 3.5 - 5.1 mmol/L 3.5 3.7 3.7  Chloride 98 - 111 mmol/L 119(H) 115(H) 114(H)  CO2 22 - 32 mmol/L 25 26 25   Calcium 8.9 - 10.3 mg/dL 8.4(X8.5(L) 8.3(L) 8.6(L)  Total Protein 6.5 - 8.1 g/dL - - -  Total Bilirubin 0.3 - 1.2 mg/dL - - -  Alkaline Phos 38 - 126 U/L - - -  AST 15 - 41 U/L - - -  ALT 0 - 44 U/L - - -   CBC Latest Ref Rng & Units 10/07/2018 10/06/2018 10/05/2018  WBC 4.0 - 10.5 K/uL 15.0(H) 15.2(H) 8.2  Hemoglobin 12.0 - 15.0 g/dL 9.0(L) 9.0(L) 9.4(L)  Hematocrit 36.0 - 46.0 % 29.0(L) 29.3(L) 29.9(L)  Platelets 150 - 400 K/uL 74(L) 64(L) 62(L)   ABG    Component Value Date/Time   PHART 7.403 10/06/2018 1415   PCO2ART 46.1 10/06/2018 1415   PO2ART 93.1 10/06/2018 1415   HCO3 27.9 10/06/2018 1415   TCO2 26 10/04/2018 1320   ACIDBASEDEF 2.0 10/04/2018 1320   O2SAT 96.9 10/06/2018 1415    CBG (last 3)  Recent Labs    10/06/18 2349 10/07/18 0340 10/07/18 0719  GLUCAP 116* 105* 100*    App cct 30 min   Brett CanalesSteve Minor ACNP [facility] Pager [phone] till 1 pm If no answer page [phone] 10/07/2018, 8:26 AM   Patient seen by Brett CanalesSteve Minor prior to surgery   I saw patient post-op in 2H   PCCM will be available for any CCM needs if [facility]CTS would like  Please let us know.   Josephine IgoBradley L , DO  Pulmonary Critical Care 10/07/2018 4:38 PM

## 2018-10-07 NOTE — Transfer of Care (Signed)
Immediate Anesthesia Transfer of Care Note  Patient: Audrey Collier  Procedure(s) Performed: AORTIC VALVE REPLACEMENT (AVR) (N/A ) TRANSESOPHAGEAL ECHOCARDIOGRAM (TEE) (N/A )  Patient Location: SICU  Anesthesia Type:General  Level of Consciousness: Patient remains intubated per anesthesia plan  Airway & Oxygen Therapy: Patient remains intubated per anesthesia plan  Post-op Assessment: Report given to RN and Post -op Vital signs reviewed and stable  Post vital signs: Reviewed and stable  Last Vitals:  Vitals Value Taken Time  BP    Temp 37 C 10/07/18 1303  Pulse 89 10/07/18 1303  Resp 14 10/07/18 1303  SpO2 97 % 10/07/18 1303  Vitals shown include unvalidated device data.  Last Pain:  Vitals:   10/07/18 0430  TempSrc: Oral  PainSc:       Patients Stated Pain Goal: 0 (57/01/77 9390)  Complications: No apparent anesthesia complications

## 2018-10-07 NOTE — Discharge Summary (Signed)
Physician Discharge Summary  Patient ID: Audrey Collier MRN: [id number] DOB/AGE: 1960-03-25 58 y.o.  Admit date: 10/01/2018 Discharge date: 12/06/2018  Admission Diagnoses: Bacteremia due to methicillin susceptible Staphylococcus aureus Sepsis Aortic valve endocarditis Acute respiratory failure with hypoxia Community acquired pneumonia Hypothyroidism Gastroesophageal reflux disease   Discharge Diagnoses:  S/P aortic valve replacement Bacteremia due to methicillin susceptible Staphylococcus aureus Poor oral intake Urinary retention Acute respiratory failure with hypoxia Community acquired pneumonia History of hypothyroidism History of gastroesophageal reflux disease  Consultants: GI, General Surgery, Nephrology,  IR, and Palliative  Discharged Condition: Stable  History of Present Illness: This is a 59 yo female admitted on 8/1 with fevers, and altered mental status.  She was noted to have an MSSA bacteremia and infiltrates concerning for pneumonia.  She was intubated on 8/4 due to respiratory failure, and underwent a TEE on 10/06/18 which showed a large aortic valve vegetation.  CT surgery was consulted ad the patient was evaluated by Dr. Kipp Brood. He agreed with the cardiology team that urgent aortic valve replacement was indicated due to the high risk for embolization of the large aortic valve vegetation.  Hospital Course:    Further pre-op work up was planned that included a gated coronary CT that could not be performed due to the patient's instabililty and respiratory failure requiring vent support.  A baseline head CT demonstrated no intracranial lesions. She was taken to the OR on 10/07/18 where aortic valve replacement was accomplished. Following the procedure, she separated from cardiopulmonary bypass without difficulty and was transferred to the CVICU where she remained hemodynamically stable. She was extubated routinely on the morning of post-op day 1.  IV nafcillin and  rifampin were continued for methicillin-sensitive staph endocarditis and pneumonia.She was mobilized with assistance from physical therapy. She was noted to have some dysphagia early post-op and was evaluated by the speech pathology team. She was felt to be at mild risk for aspiration. Dietary recommendations were followed.   She develop renal insufficiency and antibiotic dosing was adjusted accordingly. Infectious disease continued to follow her post op. As of 08/11, she is on Cefazolin IV and Rifampin orally. Per infectious disease, she will need a total of 6 weeks in duration, with last doses on 11/17/2018. She has a PICC line and this will be removed after last dose of IV antibiotic.Her creatinine did increased above baseline (1.7) post op. It went as high as 2.19 on 08/10. Her last creatinine was down to 1.65. She was volume overloaded and was given Lasix 40 mg daily. Because she did not diurese well with this, Lasix was stopped and she was started on Zaroxolyn 5 mg daily on 08/18. Her creatinine remained stable. Epicardial pacing wires have already been removed. Wound is clean, dry, and healing without evidence of infection. She did have nausea, vomiting, and a fever to 100 on 08/13. Chest x ray showed consolidation left lung base (likely atelectasis).  UA showed moderate Hgb, protein, rare bacteria. She had a bowel movement and was given Reglan. Her GI symptoms resolved. Her WBC did decrease to 10,600 on 08/16. She did have ABL anemia. Occult blood was negative and she was started on oral iron. Her last H and H was 8.3 and 27.  Her oral intake was marginal, likely secondary to low affect (on Prozac 10 mg daily) and wanting to be home. Nutrition recommended Dysphagia II diet, multivitamin, and Cortrak. She had nausea and vomiting on 08/21. KUB showed nonspecific bowel gas pattern with a few mildly  dilated loops of small bowel scattered throughout the abdomen. She has had bowel movements. She was started on  tube feedings and calorie count was also done.  Her tube feedings were changed to nightly to help encourage oral during the day. Her oral intake remained poor so tube feedings were increased to 60 ml/hr on 08/27. She was put 30 ml Prostat bid. . Also, she developed urinary retention. She was put on Flomax and required in and out catheters. She was bladder scanned every 8 hours and ultimately required re insertion of the foley catheter on 08/24. Foley was removed on 08/28 and she again, was unable to void. Foley was reinserted. A GI consult was obtained secondary to continued nausea and sporadic abdominal discomfort. LFTS were within normal range, alk phos was elevated at 192 and lipase was within normal range. An abdominal US done on 09/01 showed mildly distended gallbladder with a 15 mm gallstone, small amount of sludge, and pericholecystic and perihepatic free fluid. It was recommended to consider follow-up radionuclide hepatobiliary imaging to exclude acute cholecystitis. A general surgery consult was then obtained and a HIDA scan was ordered. HIDA was negative for acute cholecystitis. GI did an EGD on 09/03. Results showed no acute reason for inability to eat.  There were several polyps present which were biopsied.  There was no evidence of malignancy or H. Pylori present.  Her cortrak was removed during the EGD.  Due to continued lack of oral intake, she underwent percutaneous placement of gastric tube by IR on 11/04/2018.  This was cleared for use of 11/05/2018.  Unfortunately patient developed worsening projectile vomiting after oral intake on 11/07/2018.  Her hemoglobin level had dropped to 6.8 requiring transfusion of packed cells.  Her platelet count dropped to 95K and her Lovenox and ASA were discontinued.  Her tube feedings were stopped with her G tube placed to gravity.  Abdominal film was obtained and did not show evidence of abnormality.  Abdominal CT scan was obtained and showedCT abd/pelvis done  yesterday showed appropriately positioned percutaneous gastrostomy tube without definitive evidence of complication on this noncontrast examination, large stool burden within the rectal vault without evidence of enteric obstruction. Also, urinary bladder remains decompressed via Foley catheter though appears thick wall with associated mild bilateral pelviectasis and cardiomegaly with findings suggestive of pulmonary edema including small right and trace left-sided pleural effusions . Her creatinine increased to 3.48.  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 CXR was obtained and showed evidence of pulmonary edema, opacification and small bilateral effusions.  LFTs were obtained and showed a low albumin and protein level which would not be unexpected with poor oral intake.  Ultimately, she was transferred back to the ICU for closer monitoring on 09/07. A palliative care consult was obtained as patient with multiple medical issues and patient developmentally delayed. Family made patient a DNR/DNI. She finished antibiotics for endocarditis on 09/17. Patient had more vomiting after eating over this past weekend. TFs have been stopped. She was given laxatives for possible constipation. She had anasarca and was diuresed per nephrology. She developed a fever to 100.7 on 09/18. UA showed small amount of leukocytes and few bacteria. UC showed insignificant growth. Creatinine on 09/25 was 3.23. She had a kidney biopsy yesterday and we are awaiting result. 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  Differential diagnosis include evere interstitial fibrosis and tubular atrophy and minor minor proliferative activity. There is  no need for immunomodulating therapy at this point per path per their findings. Patient waiting on bed at SNF. On 10/01, medical service agreed to being attending as patient has no surgical problems, but has a multitude of  medical problems. Patient was last seen by our service on 10/02.   We ask the SNF to please do the following: 1. Please obtain vital signs at least one time daily 2.Please weigh the patient daily. If he or she continues to gain weight or develops lower extremity edema, contact the office at [phone]. 3. Ambulate patient at least three times daily and please use sternal precautions.  Consults: Cardiology, Critical Care Medicine  Significant Diagnostic Studies:   Result status: Final result    TRANSESOPHOGEAL ECHO REPORT       Patient Name:   LOUIE MEADERS Date of Exam: 10/06/2018 Medical Rec #:  [id number]       Height:       65.0 in Accession #:    [id number]      Weight:       178.6 lb Date of Birth:  25-Feb-1961       BSA:          1.89 m Patient Age:    27 years        BP:           102/66 mmHg Patient Gender: F               HR:           82 bpm. Exam Location:  Inpatient    Procedure: 2D Echo, Transesophageal Echo, Cardiac Doppler and Color Doppler  Indications:     Endocarditis   History:         Patient has prior history of Echocardiogram examinations, most                  recent 10/02/2018. Sepsis, Endocarditis, Hypoxic Respritory                  Failure.   Sonographer:     Madelaine Etienne RDCS (AE) Referring Phys:  [place] Diagnosing Phys: Sanda Klein MD     PROCEDURE: After discussion of the risks and benefits of a TEE, an informed consent was obtained from a family member. The transesophogeal probe was passed through the esophogus of the patient. Imaged were obtained with the patient in a left lateral  decubitus position. The patient developed no complications during the procedure.  IMPRESSIONS    1. The left ventricle has normal systolic function, with an ejection fraction of 60-65%. The cavity size was normal. Left ventricular diastolic parameters were normal. No evidence of left ventricular regional wall motion abnormalities.  2.  The right ventricle has normal systolc function. The cavity was normal. There is no increase in right ventricular wall thickness.  3. The aortic valve is tricuspid A mobile Aortic valve vegetation is present on the noncoronary and straddling the noncoronary and right coronary cusps. that measures 3.3 cm x 0.9 cm in size. There is mild aortic insufficiency.  4. The aortic root, ascending aorta, aortic arch and descending aorta are normal in size and structure.  5. No mitral valve vegetation visualized.  FINDINGS  Left Ventricle: The left ventricle has normal systolic function, with an ejection fraction of 60-65%. The cavity size was normal. There is no increase in left ventricular wall thickness. Left ventricular diastolic parameters were normal. No evidence of  left ventricular regional  wall motion abnormalities.  Right Ventricle: The right ventricle has normal systolic function. The cavity was normal. There is no increase in right ventricular wall thickness.  Left Atrium: Left atrial size was normal.   Right Atrium: Right atrial size was normal.  Interatrial Septum: No atrial level shunt detected by color flow Doppler.  Pericardium: There is no evidence of pericardial effusion.  Mitral Valve: The mitral valve is normal in structure. Mitral valve regurgitation is trivial by color flow Doppler. There is no evidence of mitral valve vegetation.  Tricuspid Valve: The tricuspid valve was normal in structure. Tricuspid valve regurgitation is trivial by color flow Doppler. No TV vegetation was visualized.  Aortic Valve: The aortic valve is tricuspid Aortic valve regurgitation is mild by color flow Doppler. There is No stenosis of the aortic valve. A mobile Aortic valve vegetation is present on the noncoronary and straddling the noncoronary and right  coronary cusps. that measures 3.3 cm x 0.9 cmin size. 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  Pulmonic Valve: The pulmonic valve was normal in structure. Pulmonic valve regurgitation is not visualized by color flow Doppler.  Aorta: The aortic root, ascending aorta and aortic arch are normal in size and structure.    Sanda Klein MD Electronically signed by Sanda Klein MD Signature Date/Time: 10/06/2018/1:30:21 PM       Treatments:  Surgery:  Operative Note 10/07/2018 Patient:  Lakela Kuba. Beougher Pre-Op Dx: MSSA Aortic Valve Endocarditis   Post-op Dx:  same Procedure: Aortic valve replacement with a 66m Inspiris Valve   Intra-operative Transesophageal Echocardiogram  Surgeon and Role:      * Lightfoot, HLucile Crater MD - Primary    * Dr. CRemus Loffler MD - assisting EBL:  500 ml Blood Administration: 2 units of blood, and 1 of plts Xclamp Time:  74 min Pump Time:  99 min  Drains: 28 F blake drain: X2  mediastinal  Wires: V wires Counts: correct   Indications: 58yo female with MSSA aortic valve endocarditis, with 3 cm mobile vegetation. Respiratory failure due to pneumonia, but minimal vent settings. Blood cultures are negative to date. I agree that she is at high embolic risk given the size and nature of her vegetation.  Findings: Large vegetation involving the left and noncoronary cusp.  Good LV and valve function on post CPB TEE.  Discharge Exam: Blood pressure (!) 161/93, pulse 78, temperature 98.4 F (36.9 C), temperature source Oral, resp. rate 17, height _0  (1.575 m), weight 83.9 kg, last menstrual period 04/16/2013, SpO2 100 %. Heart: RRR Lungs: Clear bilaterally Abdomen: Soft, protuberant, hyperactive bowel sounds this am Extremities: Trace LE edema Wound: Sternal wound is well healed  Disposition: Stable and discharged to SNF  Discharge Instructions    Home infusion instructions [facility] May follow [place] Protocol; May administer Cathflo as needed to maintain patency of vascular access device.; [place]  of vascular access device: [facility]: 0.9% NaCl pre/post medica...   Complete by: As directed    Instructions: May follow [place] Protocol   Instructions: May administer Cathflo as needed to maintain patency of vascular access device.   Instructions: [place] of vascular access device: [facility]: 0.9% NaCl pre/post medication administration and prn patency; Heparin 100 u/ml, 569mfor implanted ports and Heparin 10u/ml, 43m10mor all other central venous catheters.   Instructions: May follow [facility] Anaphylaxis Protocol for First Dose Administration in the home: 0.9%  NaCl at 25-50 ml/hr to maintain IV access for protocol meds. Epinephrine 0.3 ml IV/IM PRN and Benadryl 25-50 IV/IM PRN s/s of anaphylaxis.   Instructions: [place] Infusion Coordinator (RN) to assist per patient IV care needs in the home PRN.     Allergies as of 12/01/2018   No Known Allergies   Medications per discharge summary done by medical team       Home Infusion Instuctions  (From admission, onward)         Start     Ordered   10/20/18 0000  Home infusion instructions [facility] May follow [place] Dosing Protocol; May administer Cathflo as needed to maintain patency of vascular access device.; Flushing of vascular access device: per [facility] Protocol: 0.9% NaCl pre/post medica...    Question Answer Comment  Instructions May follow [place] Dosing Protocol   Instructions May administer Cathflo as needed to maintain patency of vascular access device.   Instructions Flushing of vascular access device: per [facility] Protocol: 0.9% NaCl pre/post medication administration and prn patency; Heparin 100 u/ml, 20m for implanted ports and Heparin 10u/ml, 510mfor all other central venous catheters.   Instructions May follow [facility] Anaphylaxis Protocol for First Dose Administration in the home: 0.9% NaCl at 25-50 ml/hr to maintain IV access for protocol meds. Epinephrine 0.3 ml IV/IM PRN and Benadryl  25-50 IV/IM PRN s/s of anaphylaxis.   Instructions [facility] Infusion Coordinator (RN) to assist per patient IV care needs in the home PRN.      10/20/18 1001         Follow-up Information    LiLajuana MatteMD Follow up on 10/28/2018.   Specialty: Thoracic Surgery Contact information: [address] [place] [phone]      VaTommy MedalCoLavell IslamMD. Go on 11/09/2018.   Specialty: Infectious Diseases Why: Appointment time is at 10:30 am. Please arrive 15 minutes early Contact information: [id number]      Surgery, [place].   Specialty: General Surgery Why: Call and schedule an appointment if developing symptoms of gallbladder disease.  Contact information: [place]  [phone]        The patient has been discharged on:   1.Beta Blocker:  Yes [ x  ]                              No   [   ]                              If No, reason:  2.Ace Inhibitor/ARB: Yes [   ]                                     No  [  x  ]                                     If No, reason:Elevated creatinine  3.Statin:   Yes [   ]                  No  [  x ]                  If No, reason:No CAD  4.Shela CommonsVelta Addison  [ x  ]                  No   [   ]                  If No, reason:    Signed: Arnoldo Lenis 12/01/2018, 10:22 AM

## 2018-10-07 NOTE — Op Note (Signed)
10/07/2018 Patient:  Audrey Collier. Pesnell Pre-Op Dx: MSSA Aortic Valve Endocarditis   Post-op Dx:  same Procedure: Aortic valve replacement with a 72mm Inspiris Valve   Intra-operative Transesophageal Echocardiogram  Surgeon and Role:      * Makhai Fulco, Lucile Crater, MD - Primary    * Dr. Remus Loffler, MD - assisting EBL:  500 ml Blood Administration: 2 units of blood, and 1 of plts Xclamp Time:  74 min Pump Time:  99 min  Drains: 28 F blake drain: X2  mediastinal  Wires: V wires Counts: correct   Indications: 58 yo female with MSSA aortic valve endocarditis, with 3 cm mobile vegetation.  Respiratory failure due to pneumonia, but minimal vent settings.  Blood cultures are negative to date.  I agree that she is at high embolic risk given the size and nature of her vegetation.  Findings: Large vegetation involving the left and noncoronary cusp.  Good LV and valve function on post CPB TEE.  Operative Technique: All invasive lines were placed in pre-op holding.  After the risks, benefits and alternatives were thoroughly discussed, the patient was brought to the operative theatre.  Anesthesia was induced, and the patient was prepped and draped in normal sterile fashion.  An appropriate surgical pause was performed, and pre-operative antibiotics were dosed accordingly.  We began with an incision over the chest for the sternotomy.  This was carried down with bovie cautery, and the sternum was divided with a reciprocating saw.  Meticulous hemostasis was obtained.  The patient was systemically heparinized.   The sternal retractor was placed.  The pericardium was divided in the midline and fashioned into a cradle with pericardial stitches.   After we confirmed an appropriate ACT, the ascending aorta was cannulated in standard fashion.  The right atrial appendage was used for venous cannulation site.  Cardiopulmonary bypass was initiated and we began to cool the  patient to 32 degrees. The cross clamp was applied, and a dose of anterograde cardioplegia was given with good arrest of the heart.  Our aortotomy was made and directed toward the non coronary cusp.  The valve was inspected.  There was a large vegetation on the non, and left cusp.  No evidence of annular abscess.  All leaflets were excised.  The annulus was sized to a 57mm Inspiris valve.  The left ventricle was then copiously irrigated.  Pledgeted mattress sutures were placed circumferentially through the annulus.  These sutures were then passed through the sewing ring of the valve.  Once the valve was seated in the annulus, it was secured with Core-knot sutures.  We began to rewarm, and close our aortotomy in 2 layers.  A re-animation dose of cardioplegia was given.  After de-airing the heart, the aortic cross clamp was removed.  We checked our valve function, and for air using the TEE.  Once we were satisfying, we separated from cardiopulmonary bypass without event.    The heparin was reversed with protamine, and hemostasis was obtained.  Chest tubes and wires were placed, and the sternum was re-approximated with with sternal wires.  The soft tissue and skin were re-approximated wth absorbable suture.    The patient tolerated the procedure without any immediate complications, and was transferred to the ICU  in guarded condition.  Makailah Slavick Keane Scrape Aleila Syverson

## 2018-10-07 NOTE — Anesthesia Procedure Notes (Signed)
Anesthesia Procedure Image    

## 2018-10-08 ENCOUNTER — Inpatient Hospital Stay (HOSPITAL_COMMUNITY): Payer: Medicaid Other

## 2018-10-08 DIAGNOSIS — E870 Hyperosmolality and hypernatremia: Secondary | ICD-10-CM

## 2018-10-08 LAB — CBC WITH DIFFERENTIAL/PLATELET
Abs Immature Granulocytes: 0.46 10*3/uL — ABNORMAL HIGH (ref 0.00–0.07)
Basophils Absolute: 0 10*3/uL (ref 0.0–0.1)
Basophils Relative: 0 %
Eosinophils Absolute: 0.1 10*3/uL (ref 0.0–0.5)
Eosinophils Relative: 0 %
HCT: 28.3 % — ABNORMAL LOW (ref 36.0–46.0)
Hemoglobin: 8.7 g/dL — ABNORMAL LOW (ref 12.0–15.0)
Immature Granulocytes: 4 %
Lymphocytes Relative: 10 %
Lymphs Abs: 1.3 10*3/uL (ref 0.7–4.0)
MCH: 28.4 pg (ref 26.0–34.0)
MCHC: 30.7 g/dL (ref 30.0–36.0)
MCV: 92.5 fL (ref 80.0–100.0)
Monocytes Absolute: 0.8 10*3/uL (ref 0.1–1.0)
Monocytes Relative: 6 %
Neutro Abs: 10.2 10*3/uL — ABNORMAL HIGH (ref 1.7–7.7)
Neutrophils Relative %: 80 %
Platelets: 64 10*3/uL — ABNORMAL LOW (ref 150–400)
RBC: 3.06 MIL/uL — ABNORMAL LOW (ref 3.87–5.11)
RDW: 16.9 % — ABNORMAL HIGH (ref 11.5–15.5)
WBC: 12.9 10*3/uL — ABNORMAL HIGH (ref 4.0–10.5)
nRBC: 0.2 % (ref 0.0–0.2)

## 2018-10-08 LAB — BASIC METABOLIC PANEL WITH GFR
Anion gap: 9 (ref 5–15)
BUN: 32 mg/dL — ABNORMAL HIGH (ref 6–20)
CO2: 23 mmol/L (ref 22–32)
Calcium: 8 mg/dL — ABNORMAL LOW (ref 8.9–10.3)
Chloride: 122 mmol/L — ABNORMAL HIGH (ref 98–111)
Creatinine, Ser: 1.6 mg/dL — ABNORMAL HIGH (ref 0.44–1.00)
GFR calc Af Amer: 41 mL/min — ABNORMAL LOW
GFR calc non Af Amer: 35 mL/min — ABNORMAL LOW
Glucose, Bld: 135 mg/dL — ABNORMAL HIGH (ref 70–99)
Potassium: 4 mmol/L (ref 3.5–5.1)
Sodium: 154 mmol/L — ABNORMAL HIGH (ref 135–145)

## 2018-10-08 LAB — CBC
HCT: 35.8 % — ABNORMAL LOW (ref 36.0–46.0)
HCT: 30.4 % — ABNORMAL LOW (ref 36.0–46.0)
Hemoglobin: 11.1 g/dL — ABNORMAL LOW (ref 12.0–15.0)
Hemoglobin: 9.5 g/dL — ABNORMAL LOW (ref 12.0–15.0)
MCH: 28.5 pg (ref 26.0–34.0)
MCH: 28.3 pg (ref 26.0–34.0)
MCHC: 31 g/dL (ref 30.0–36.0)
MCHC: 31.3 g/dL (ref 30.0–36.0)
MCV: 91.8 fL (ref 80.0–100.0)
MCV: 90.5 fL (ref 80.0–100.0)
Platelets: 88 10*3/uL — ABNORMAL LOW (ref 150–400)
Platelets: 68 10*3/uL — ABNORMAL LOW (ref 150–400)
RBC: 3.9 MIL/uL (ref 3.87–5.11)
RBC: 3.36 MIL/uL — ABNORMAL LOW (ref 3.87–5.11)
RDW: 16.1 % — ABNORMAL HIGH (ref 11.5–15.5)
RDW: 16.6 % — ABNORMAL HIGH (ref 11.5–15.5)
WBC: 25.4 10*3/uL — ABNORMAL HIGH (ref 4.0–10.5)
WBC: 16 10*3/uL — ABNORMAL HIGH (ref 4.0–10.5)
nRBC: 0.1 % (ref 0.0–0.2)
nRBC: 0.1 % (ref 0.0–0.2)

## 2018-10-08 LAB — POCT I-STAT 7, (LYTES, BLD GAS, ICA,H+H)
Acid-base deficit: 6 mmol/L — ABNORMAL HIGH (ref 0.0–2.0)
Bicarbonate: 19.2 mmol/L — ABNORMAL LOW (ref 20.0–28.0)
Bicarbonate: 23.6 mmol/L (ref 20.0–28.0)
Calcium, Ion: 1.23 mmol/L (ref 1.15–1.40)
Calcium, Ion: 1.19 mmol/L (ref 1.15–1.40)
HCT: 25 % — ABNORMAL LOW (ref 36.0–46.0)
HCT: 27 % — ABNORMAL LOW (ref 36.0–46.0)
Hemoglobin: 8.5 g/dL — ABNORMAL LOW (ref 12.0–15.0)
Hemoglobin: 9.2 g/dL — ABNORMAL LOW (ref 12.0–15.0)
O2 Saturation: 96 %
O2 Saturation: 94 %
Patient temperature: 37.9
Patient temperature: 99.4
Potassium: 3.9 mmol/L (ref 3.5–5.1)
Potassium: 3.5 mmol/L (ref 3.5–5.1)
Sodium: 157 mmol/L — ABNORMAL HIGH (ref 135–145)
Sodium: 158 mmol/L — ABNORMAL HIGH (ref 135–145)
TCO2: 20 mmol/L — ABNORMAL LOW (ref 22–32)
TCO2: 25 mmol/L (ref 22–32)
pCO2 arterial: 38 mmHg (ref 32.0–48.0)
pCO2 arterial: 35.6 mmHg (ref 32.0–48.0)
pH, Arterial: 7.316 — ABNORMAL LOW (ref 7.350–7.450)
pH, Arterial: 7.43 (ref 7.350–7.450)
pO2, Arterial: 93 mmHg (ref 83.0–108.0)
pO2, Arterial: 70 mmHg — ABNORMAL LOW (ref 83.0–108.0)

## 2018-10-08 LAB — BASIC METABOLIC PANEL
Anion gap: 9 (ref 5–15)
BUN: 33 mg/dL — ABNORMAL HIGH (ref 6–20)
CO2: 23 mmol/L (ref 22–32)
Calcium: 8.1 mg/dL — ABNORMAL LOW (ref 8.9–10.3)
Chloride: 122 mmol/L — ABNORMAL HIGH (ref 98–111)
Creatinine, Ser: 1.83 mg/dL — ABNORMAL HIGH (ref 0.44–1.00)
GFR calc Af Amer: 35 mL/min — ABNORMAL LOW (ref >60)
GFR calc non Af Amer: 30 mL/min — ABNORMAL LOW (ref >60)
Glucose, Bld: 126 mg/dL — ABNORMAL HIGH (ref 70–99)
Potassium: 2.9 mmol/L — ABNORMAL LOW (ref 3.5–5.1)
Sodium: 154 mmol/L — ABNORMAL HIGH (ref 135–145)

## 2018-10-08 LAB — PHOSPHORUS: Phosphorus: 4.3 mg/dL (ref 2.5–4.6)

## 2018-10-08 LAB — PREPARE PLATELET PHERESIS: Unit division: 0

## 2018-10-08 LAB — GLUCOSE, CAPILLARY
Glucose-Capillary: 107 mg/dL — ABNORMAL HIGH (ref 70–99)
Glucose-Capillary: 123 mg/dL — ABNORMAL HIGH (ref 70–99)
Glucose-Capillary: 127 mg/dL — ABNORMAL HIGH (ref 70–99)
Glucose-Capillary: 109 mg/dL — ABNORMAL HIGH (ref 70–99)
Glucose-Capillary: 137 mg/dL — ABNORMAL HIGH (ref 70–99)
Glucose-Capillary: 116 mg/dL — ABNORMAL HIGH (ref 70–99)
Glucose-Capillary: 126 mg/dL — ABNORMAL HIGH (ref 70–99)
Glucose-Capillary: 113 mg/dL — ABNORMAL HIGH (ref 70–99)

## 2018-10-08 LAB — BPAM PLATELET PHERESIS
Blood Product Expiration Date: 202008082359
ISSUE DATE / TIME: 202008071119
Unit Type and Rh: 6200

## 2018-10-08 LAB — MAGNESIUM
Magnesium: 3.1 mg/dL — ABNORMAL HIGH (ref 1.7–2.4)
Magnesium: 2.8 mg/dL — ABNORMAL HIGH (ref 1.7–2.4)

## 2018-10-08 MED ORDER — ACETAMINOPHEN 325 MG PO TABS: 650.0000 mg | ORAL_TABLET | Freq: Four times a day (QID) | ORAL | Status: DC | PRN

## 2018-10-08 MED ORDER — SODIUM BICARBONATE 8.4 % IV SOLN
50.0000 meq | Freq: Once | INTRAVENOUS | Status: AC
  Administered 2018-10-08: 50 meq via INTRAVENOUS
  Filled 2018-10-08: qty 50

## 2018-10-08 MED ORDER — SODIUM BICARBONATE 8.4 % IV SOLN
INTRAVENOUS | Status: AC
  Filled 2018-10-08: qty 50

## 2018-10-08 MED ORDER — ORAL CARE MOUTH RINSE
15.0000 mL | Freq: Two times a day (BID) | OROMUCOSAL | Status: DC
  Administered 2018-10-08 – 2018-12-06 (×102): 15 mL via OROMUCOSAL

## 2018-10-08 MED ORDER — NOREPINEPHRINE 4 MG/250ML-% IV SOLN: 0.0000 ug/min | INTRAVENOUS | Status: DC

## 2018-10-08 MED ORDER — RIFAMPIN ORAL SUSPENSION 25 MG/ML
300.0000 mg | Freq: Three times a day (TID) | ORAL | Status: DC
  Administered 2018-10-08 – 2018-10-14 (×17): 300 mg via ORAL
  Filled 2018-10-08 (×21): qty 5

## 2018-10-08 MED ORDER — INSULIN ASPART 100 UNIT/ML ~~LOC~~ SOLN
0.0000 [IU] | SUBCUTANEOUS | Status: DC
  Administered 2018-10-08 – 2018-10-10 (×5): 2 [IU] via SUBCUTANEOUS

## 2018-10-08 MED ORDER — FUROSEMIDE 10 MG/ML IJ SOLN
20.0000 mg | Freq: Once | INTRAMUSCULAR | Status: AC
  Administered 2018-10-08: 20 mg via INTRAVENOUS
  Filled 2018-10-08: qty 2

## 2018-10-08 MED ORDER — MONTELUKAST SODIUM 10 MG PO TABS
10.0000 mg | ORAL_TABLET | Freq: Every day | ORAL | Status: DC
  Administered 2018-10-08 – 2018-12-05 (×56): 10 mg via ORAL
  Filled 2018-10-08 (×59): qty 1

## 2018-10-08 MED ORDER — ENOXAPARIN SODIUM 40 MG/0.4ML ~~LOC~~ SOLN
40.0000 mg | Freq: Every day | SUBCUTANEOUS | Status: DC
  Administered 2018-10-08 – 2018-10-09 (×2): 40 mg via SUBCUTANEOUS
  Filled 2018-10-08 (×2): qty 0.4

## 2018-10-08 MED ORDER — FLUOXETINE HCL 10 MG PO CAPS
10.0000 mg | ORAL_CAPSULE | Freq: Every day | ORAL | Status: DC
  Administered 2018-10-08 – 2018-11-05 (×28): 10 mg via ORAL
  Filled 2018-10-08 (×29): qty 1

## 2018-10-08 MED ORDER — POTASSIUM CHLORIDE 20 MEQ/15ML (10%) PO SOLN
20.0000 meq | ORAL | Status: AC
  Administered 2018-10-08 – 2018-10-09 (×3): 20 meq
  Filled 2018-10-08 (×3): qty 15

## 2018-10-08 NOTE — Progress Notes (Signed)
[address]       [place] [id number]             [phone]                 1 Day Post-Op Procedure(s) (LRB): AORTIC VALVE REPLACEMENT (AVR) (N/A) TRANSESOPHAGEAL ECHOCARDIOGRAM (TEE) (N/A)   Events: Extubated this am.  Off all drips _______________________________________________________________ Vitals: BP (!) 100/55   Pulse 79   Temp (!) 101.1 F (38.4 C)   Resp 17   Ht 5\' 2"  (1.575 m)   Wt 84 kg   LMP 04/16/2013 (Approximate)   SpO2 96%   BMI 33.87 kg/m   - Neuro: moving all extremities, following commands.     - Cardiovascular: Sinus, regular.dressings clean  Drips: none PAP: (19-50)/(6-29) 25/14 CO:  [4 L/min-6.1 L/min] 4 L/min CI:  [2.1 L/min/m2-3.2 L/min/m2] 2.9 L/min/m2  - Pulm: on 6L.  coarseBS   ABG    Component Value Date/Time   PHART 7.316 (L) 10/08/2018 0307   PCO2ART 38.0 10/08/2018 0307   PO2ART 93.0 10/08/2018 0307   HCO3 19.2 (L) 10/08/2018 0307   TCO2 20 (L) 10/08/2018 0307   ACIDBASEDEF 6.0 (H) 10/08/2018 0307   O2SAT 96.0 10/08/2018 0307    - Abd: distended - Extremity: + edema  .Intake/Output      08/07 0701 - 08/08 0700 08/08 0701 - 08/09 0700   P.O. 30    I.V. (mL/kg) 4203.6 (50)    Blood 687    NG/GT 1050    IV Piggyback 1769.2    Total Intake(mL/kg) 7739.9 (92.1)    Urine (mL/kg/hr) 2020 (1)    Emesis/NG output 0    Blood 1150    Chest Tube 110    Total Output 3280    Net +4459.9            _______________________________________________________________ Labs: CBC Latest Ref Rng & Units 10/08/2018 10/08/2018 10/07/2018  WBC 4.0 - 10.5 K/uL - 12.9(H) -  Hemoglobin 12.0 - 15.0 g/dL 9.1(Y8.5(L) 7.8(G8.7(L) 9.5(A9.5(L)  Hematocrit 36.0 - 46.0 % 25.0(L) 28.3(L) 28.0(L)  Platelets 150 - 400 K/uL - 64(L) -   CMP Latest Ref Rng & Units 10/08/2018 10/08/2018 10/07/2018  Glucose 70 - 99 mg/dL - 213(Y135(H) 865(H137(H)  BUN 6 - 20 mg/dL - 84(O32(H) 96(E32(H)  Creatinine 0.44 - 1.00 mg/dL - 9.52(W1.60(H) 4.13(K1.40(H)  Sodium 135 - 145 mmol/L 157(H)  154(H) 154(H)  Potassium 3.5 - 5.1 mmol/L 3.9 4.0 4.5  Chloride 98 - 111 mmol/L - 122(H) 119(H)  CO2 22 - 32 mmol/L - 23 -  Calcium 8.9 - 10.3 mg/dL - 8.0(L) -  Total Protein 6.5 - 8.1 g/dL - - -  Total Bilirubin 0.3 - 1.2 mg/dL - - -  Alkaline Phos 38 - 126 U/L - - -  AST 15 - 41 U/L - - -  ALT 0 - 44 U/L - - -    CXR:   _______________________________________________________________  Assessment and Plan: POD 1 s/p AVR with 21mm Inspiris valve for MSSA endocarditis.  Hx of developmental delays  Neuro: stable.  Nonfocal.  Pain control CV: off all drips.  Sinus rhythm.  Will remove wires, and swan.  Will keep aline for soft pressures.  Will remove mediastinal drains. Pulm: bilateral infiltrates due to pneumonia.  Extubated on 6L.  Aggressive pulm toilet Renal: creat up, good uop.  Will continue to follow GI: will advance diet today Heme: stable ID: MSSA endocarditis, on nafcillin.  Leukocytosis trending down Endo: transition to SSI Dispo: continue ICU care.  Melodie Bouillon, MD 10/08/2018 8:18 AM

## 2018-10-08 NOTE — Progress Notes (Signed)
NAME:  Audrey Collier, MRN:  [id number], DOB:  [birth year], LOS: 7 ADMISSION DATE:  10/01/2018, CONSULTATION DATE:  10/02/18 REFERRING MD:  Dr. Verlon Au, CHIEF COMPLAINT: Vomiting, fever   Brief History   58 yo female presented with fever, vomiting, weakness.  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  Echo showed vegetation.  Transferred to ICU 8/04 due to respiratory failure.  Past Medical History  Developmental Delay, GERD, Anxiety, Hypothyroidism, [place] [facility] Events   8/01 Admit with fever, vomiting and weakness 8/04 transfer to ICU  Consults:  Cardiology ID  Procedures:   ETT 8/7-8/8  Significant Diagnostic Tests:  Echo 8/02 >> EF 55 to 60%, semi mobile density on aortic valve and tricuspid valve CT chest 10/05/2018 there is extensive bilateral groundglass opacities.  Small bilateral pleural effusions.  Nonspecific features of airspace disease to suggest septic emboli.  No particular evidence of cavitation discrete nodularity 2020 TEE large mobile vegetation on aortic valve with leaflet dysfunction  Micro Data:  SARS CoV2 8/01 >> negative Blood 8/01 >> MSSA SARS CoV2 8/02 >> negative Blood 8/02 >> MSSA  Antimicrobials:  Ancef 8/2 >> 10/05/2018 Nafcillin 10/05/2018>> Vancomycin and Zinacef for prophylaxis surgery 10/07/1998  Interim history/subjective:  Post valve replacement, no events overnight.  Objective   Blood pressure (!) 100/55, pulse 79, temperature (!) 101.1 F (38.4 C), resp. rate 17, height 5\' 2"  (1.575 m), weight 84 kg, last menstrual period 04/16/2013, SpO2 96 %. PAP: (19-50)/(6-29) 25/14 CO:  [4 L/min-6.1 L/min] 4 L/min CI:  [2.1 L/min/m2-3.2 L/min/m2] 2.9 L/min/m2  Vent Mode: PSV;CPAP FiO2 (%):  [40 %-50 %] 40 % Set Rate:  [12 bmp] 12 bmp Vt Set:  [400 mL-490 mL] 400 mL PEEP:  [5 cmH20] 5 cmH20 Pressure Support:  [8 cmH20-10 cmH20] 8 cmH20 Plateau Pressure:  [12 cmH20-22 cmH20] 12 cmH20   Intake/Output Summary (Last 24 hours) at  10/08/2018 2585 Last data filed at 10/08/2018 0500 Gross per 24 hour  Intake 7739.86 ml  Output 3280 ml  Net 4459.86 ml   Filed Weights   10/05/18 0500 10/06/18 0425 10/07/18 0500  Weight: 82.5 kg 81 kg 84 kg   Examination:  General: Well appearing this AM, NAD HEENT: [place]/AT, PERRL, EOM-I and MMM, ETT in place Neuro: Alert and interactive, moving all ext to command CV: RRR, Nl S1/S2 and -M/R/G PULM: CTA bilaterally GI: Soft, NT, ND and +BS Extremities: warm/dry, 1+ edema  Skin: no rashes or lesions  I reviewed CXR myself, ETT is in a good position  Resolved Hospital Problem list     Assessment & Plan:   Acute hypoxic respiratory failure from MSSA pneumonia. 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  Large mobile vegetation on aortic valve with aortic valve replacement scheduled on 10/07/2018 Infectious diseases following currently on nafcillin Noted to have hypermobile vegetation on aortic valve and to have aortic valve replacement today. Most likely she will return to the open heart unit on 2H post surgery  Anemia of critical illness. Thrombocytopenia in setting of sepsis. Recent Labs    10/08/18 0300 10/08/18 0307  HGB 8.7* 8.5*  plt 62->64  On 10/06/18  Monitor CBC Transfuse per protocol note to have aortic valve replacement on 10/07/2018  Hx of hypothyroidism. Currently on IV Synthroid  Hyperkalemia: - D/C NS - LR as ordered - BMET in AM - Replace electrolytes as indicated  PCCM will sign off, please call back if needed  Best practice:  Diet: NPO DVT prophylaxis: SCDs GI prophylaxis: Protonix Mobility: bed rest Code Status: Full Code  Disposition: ICU, taken to the OR 10/07/2018 for aortic valve replacement  Labs    CMP Latest Ref Rng & Units 10/08/2018 10/08/2018 10/07/2018  Glucose 70 - 99 mg/dL - 161(W135(H) 960(A137(H)  BUN 6 - 20 mg/dL - 54(U32(H) 98(J32(H)   Creatinine 0.44 - 1.00 mg/dL - 1.91(Y1.60(H) 7.82(N1.40(H)  Sodium 135 - 145 mmol/L 157(H) 154(H) 154(H)  Potassium 3.5 - 5.1 mmol/L 3.9 4.0 4.5  Chloride 98 - 111 mmol/L - 122(H) 119(H)  CO2 22 - 32 mmol/L - 23 -  Calcium 8.9 - 10.3 mg/dL - 8.0(L) -  Total Protein 6.5 - 8.1 g/dL - - -  Total Bilirubin 0.3 - 1.2 mg/dL - - -  Alkaline Phos 38 - 126 U/L - - -  AST 15 - 41 U/L - - -  ALT 0 - 44 U/L - - -   CBC Latest Ref Rng & Units 10/08/2018 10/08/2018 10/07/2018  WBC 4.0 - 10.5 K/uL - 12.9(H) -  Hemoglobin 12.0 - 15.0 g/dL 5.6(O8.5(L) 1.3(Y8.7(L) 8.6(V9.5(L)  Hematocrit 36.0 - 46.0 % 25.0(L) 28.3(L) 28.0(L)  Platelets 150 - 400 K/uL - 64(L) -   ABG    Component Value Date/Time   PHART 7.316 (L) 10/08/2018 0307   PCO2ART 38.0 10/08/2018 0307   PO2ART 93.0 10/08/2018 0307   HCO3 19.2 (L) 10/08/2018 0307   TCO2 20 (L) 10/08/2018 0307   ACIDBASEDEF 6.0 (H) 10/08/2018 0307   O2SAT 96.0 10/08/2018 0307    CBG (last 3)  Recent Labs    10/08/18 0020 10/08/18 0302 10/08/18 0748  GLUCAP 127* 123* 109*   The patient is critically ill with multiple organ systems failure and requires high complexity decision making for assessment and support, frequent evaluation and titration of therapies, application of advanced monitoring technologies and extensive interpretation of multiple databases.   Critical Care Time devoted to patient care services described in this note is  32  Minutes. This time reflects time of care of this signee Dr Koren BoundWesam Yacoub. This critical care time does not reflect procedure time, or teaching time or supervisory time of PA/NP/Med student/Med Resident etc but could involve care discussion time.  Alyson ReedyWesam G. Yacoub, M.D. [facility]. Pager: [phone]. After hours pager: [phone].

## 2018-10-08 NOTE — Progress Notes (Signed)
MD notified--- AM ABG results, Hgb 8.5, Na 157. Verbal orders given from 1 amp Bicarb.

## 2018-10-08 NOTE — Evaluation (Signed)
Clinical/Bedside Swallow Evaluation Patient Details  Name: Audrey SevereSheila D Dolinger MRN: [id number] Date of Birth: [birth year]  Today's Date: 10/08/2018 Time: SLP Start Time (ACUTE ONLY): 1550 SLP Stop Time (ACUTE ONLY): 1602 SLP Time Calculation (min) (ACUTE ONLY): 12 min  Past Medical History:  Past Medical History:  Diagnosis Date  . Anxiety   . Dizziness   . Endometrial polyp 03/26/2014  . Fecal occult blood test positive 03/15/2013   will send 3 cards home and refer to GI for colonoscopy  . GERD (gastroesophageal reflux disease)   . Hypothyroidism   . Mental disorder    mentally challenged  . PMB (postmenopausal bleeding) 03/26/2014  . Thickened endometrium 03/26/2014   ?polyp will get HSG   Past Surgical History:  Past Surgical History:  Procedure Laterality Date  . CATARACT EXTRACTION W/PHACO Left 08/06/2017   Procedure: CATARACT EXTRACTION PHACO AND INTRAOCULAR LENS PLACEMENT LEFT EYE;  Surgeon: Fabio PierceWrzosek, James, MD;  Location: [facility];  Service: Ophthalmology;  Laterality: Left;  CDE: 12.63  . CATARACT EXTRACTION W/PHACO Right 10/01/2017   Procedure: CATARACT EXTRACTION PHACO AND INTRAOCULAR LENS PLACEMENT (IOC);  Surgeon: Fabio PierceWrzosek, James, MD;  Location: [facility];  Service: Ophthalmology;  Laterality: Right;  CDE: 5.77  . COLONOSCOPY N/A 12/01/2013   Dr. Fields:moderate sized internal hemorrhoids/left colon is redundant  . DILITATION & CURRETTAGE/HYSTROSCOPY WITH NOVASURE ABLATION N/A 04/18/2014   Procedure: DILATATION & CURETTAGE/HYSTEROSCOPY WITH NOVASURE ENDOMETRIAL ABLATION;  Surgeon: Lazaro ArmsLuther H Eure, MD;  Location: [facility];  Service: Gynecology;  Laterality: N/A;  Uterine Cavity Length 5cm     . ESOPHAGOGASTRODUODENOSCOPY N/A 12/01/2013   WUJ:WJXBSLF:mild non erosive gastritis. +H.pylori   . NO PAST SURGERIES    . POLYPECTOMY N/A 04/18/2014   Procedure: ENDOMETRIAL POLYPECTOMY;  Surgeon: Lazaro ArmsLuther H Eure, MD;  Location: [facility];  Service: Gynecology;  Laterality: N/A;   HPI:  58 yo female presented with  fever, vomiting, weakness.  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  Echo showed vegetation.  Transferred to ICU 8/04 due to respiratory failure.  Underwent AVR with TEE intraoprative on 10/07/18. Intubated 10/04/18 - Extubated 10/08/18.   Assessment / Plan / Recommendation Clinical Impression  Per nursing, pt consumed thin liquds via straw, applesauce and medicine whole in applesauce prior to SLP arriving to room. Nursing reports that pt is ready for liquid diet. SLP present and provided assessment of solid textures. PLOF is unclear but documentation supports some form of mental delay at baseline and per nursing pt is nonverbal at baseline. During this session pt was edentulous, makes good eye contact and used head nods to indicate food preferences. Pt with anterior tongue thrust when consuming trials of solid graham crackers with this likely to be baseline motor pattern. Pt's facial expressions also didn't indicate a strong desire for the solid texture. Despite anterior tongue movements, pt was able to clear oral boluses without respiratory decline. Recommend upgrading to dysphagia 2 with thin liquids, medicine whole in applesauce. ST to sign off at this time. Should pt demonstrate desire for more solid foods, ST can be reconsulted.  SLP Visit Diagnosis: Dysphagia, unspecified (R13.10)    Aspiration Risk  Mild aspiration risk    Diet Recommendation Dysphagia 2 (Fine chop);Thin liquid   Liquid Administration via: Straw;Cup Medication Administration: Whole meds with puree Supervision: Staff to assist with self feeding Compensations: Minimize environmental distractions;Slow rate;Small sips/bites Postural Changes: Seated upright at 90 degrees    Other  Recommendations Oral Care Recommendations: Oral care BID   Follow up Recommendations  None        Swallow Study   General Date of Onset: 10/08/18 HPI: 58 yo female presented with fever, vomiting, weakness.  Found to have MSSA bacteremia with  pneumonia.  Echo showed vegetation.  Transferred to ICU 8/04 due to respiratory failure.  Underwent AVR with TEE intraoprative on 10/07/18. Intubated 10/04/18 - Extubated 10/08/18. Type of Study: Bedside Swallow Evaluation Previous Swallow Assessment: none in chart Diet Prior to this Study: NPO Temperature Spikes Noted: No History of Recent Intubation: Yes Length of Intubations (days): 4 days Date extubated: 10/08/18 Behavior/Cognition: Alert;Cooperative;Pleasant mood Oral Cavity Assessment: Within Functional Limits Oral Care Completed by SLP: Recent completion by staff Oral Cavity - Dentition: Edentulous Vision: Functional for self-feeding Self-Feeding Abilities: Total assist Patient Positioning: Upright in chair Baseline Vocal Quality: (nonverbal at baseline per nursing report) Volitional Cough: Weak Volitional Swallow: Able to elicit    Oral/Motor/Sensory Function Overall Oral Motor/Sensory Function: Within functional limits   Ice Chips Ice chips: Within functional limits Presentation: Spoon   Thin Liquid Thin Liquid: Within functional limits Presentation: Straw    Nectar Thick Nectar Thick Liquid: Not tested   Honey Thick Honey Thick Liquid: Not tested   Puree Puree: Within functional limits Presentation: Spoon   Solid     Solid: Impaired Oral Phase Impairments: Reduced lingual movement/coordination      Ontario Pettengill 10/08/2018,4:40 PM

## 2018-10-08 NOTE — Procedures (Signed)
Extubation Procedure Note  Patient Details:   Name: Audrey Collier DOB: 1960/07/14 MRN: [id number]   Airway Documentation:    Vent end date: 10/08/18 Vent end time: 0808   Evaluation  O2 sats: stable throughout Complications: No apparent complications Patient did tolerate procedure well. Bilateral Breath Sounds: Rhonchi   Yes  Placed on 4l [place] Incentive spirometer instructed  [name] 10/08/2018, 8:46 AM

## 2018-10-08 NOTE — Progress Notes (Signed)
200 mL of fentanyl wasted in stericycle at 1745. Witnessed by Bo Merino, RN.   Shelby Dubin, RN

## 2018-10-08 NOTE — Evaluation (Signed)
Physical Therapy Evaluation Patient Details Name: Audrey Collier MRN: [id number] DOB: [birth year] Today's Date: 10/08/2018   History of Present Illness  58 yo female presented with fever, vomiting, weakness.  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  Echo showed vegetation.  Transferred to ICU 8/04 due to respiratory failure.  Underwent AVR with TEE intraoprative on 10/07/18. Extubated 10/08/18.  Clinical Impression  Patient presents with decreased independence with mobility due to decreased balance, decreased activity tolerance, decreased strength and limited cardiorespiratory endurance.  Feel she will benefit from skilled PT in the acute setting and follow up CIR level rehab prior to return to group home.  Currently max A for squat pivot transfer to recliner and mod to max for bed mobility with at least min A for sitting balance EOB R and posterior lean.  PLOF not clear, but pt was ambulatory at baseline.     Follow Up Recommendations CIR    Equipment Recommendations  Other (comment)(TBA)    Recommendations for Other Services Rehab consult     Precautions / Restrictions Precautions Precautions: Fall;Sternal      Mobility  Bed Mobility Overal bed mobility: Needs Assistance Bed Mobility: Supine to Sit     Supine to sit: HOB elevated;Max assist;Mod assist     General bed mobility comments: assist to guide legs to EOB and assist to lift trunk uprighjt, scooting to EOB with assist using pads under pt.  Transfers Overall transfer level: Needs assistance   Transfers: Squat Pivot Transfers     Squat pivot transfers: Max assist     General transfer comment: able to use legs some to allow pivot to recliner right by her R hip, sat on corner of chair and two more "scoot-pivots" to get to middle of chair and position for comfort.  Ambulation/Gait             General Gait Details: unable  Stairs            Wheelchair Mobility    Modified Rankin (Stroke Patients  Only)       Balance Overall balance assessment: Needs assistance   Sitting balance-Leahy Scale: Poor Sitting balance - Comments: min A at least for sitting EOB due to leaning posterior and to R Postural control: Right lateral lean;Posterior lean   Standing balance-Leahy Scale: Zero Standing balance comment: unable to achieve full upright standing                             Pertinent Vitals/Pain Pain Assessment: Faces Faces Pain Scale: Hurts even more Pain Location: generalized, with grimacing, but denies and does not localize Pain Descriptors / Indicators: Grimacing Pain Intervention(s): Monitored during session;Repositioned;Limited activity within patient's tolerance    Home Living Family/patient expects to be discharged to:: Group home                      Prior Function           Comments: pt unable to state clearly, but reports was ambulatory     Hand Dominance        Extremity/Trunk Assessment   Upper Extremity Assessment Upper Extremity Assessment: RUE deficits/detail;LUE deficits/detail RUE Deficits / Details: AAROM to about 90 shoulder flexion, noted significant edema in UE's, grips weakly LUE Deficits / Details: AAROM to about 90 shoulder flexion, noted significant edema in UE's, grips weakly    Lower Extremity Assessment Lower Extremity Assessment: RLE deficits/detail;LLE deficits/detail RLE Deficits /  Details: generalized edema in LE's, ankle DF/PF WFL, knee flexion limited due weakness, edema, strength knee extension 3+/5 LLE Deficits / Details: generalized edema in LE's, ankle DF/PF WFL, knee flexion limited due weakness, edema, strength knee extension 3+/5       Communication   Communication: Expressive difficulties(soft spoken and limited one word answers)  Cognition Arousal/Alertness: Awake/alert Behavior During Therapy: Flat affect Overall Cognitive Status: No family/caregiver present to determine baseline cognitive  functioning                                        General Comments General comments (skin integrity, edema, etc.): VSS onm 6L O2 via [place]    Exercises     Assessment/Plan    PT Assessment Patient needs continued PT services  PT Problem List Decreased strength;Decreased activity tolerance;Decreased mobility;Cardiopulmonary status limiting activity;Decreased knowledge of precautions;Decreased knowledge of use of DME;Decreased balance       PT Treatment Interventions DME instruction;Therapeutic activities;Balance training;Patient/family education;Therapeutic exercise;Functional mobility training;Gait training    PT Goals (Current goals can be found in the Care Plan section)  Acute Rehab PT Goals Patient Stated Goal: agreeable to get stronger PT Goal Formulation: With patient Time For Goal Achievement: 10/22/18 Potential to Achieve Goals: Good    Frequency Min 3X/week   Barriers to discharge        Co-evaluation               AM-PAC PT "6 Clicks" Mobility  Outcome Measure Help needed turning from your back to your side while in a flat bed without using bedrails?: A Lot Help needed moving from lying on your back to sitting on the side of a flat bed without using bedrails?: Total Help needed moving to and from a bed to a chair (including a wheelchair)?: Total Help needed standing up from a chair using your arms (e.g., wheelchair or bedside chair)?: Total Help needed to walk in hospital room?: Total Help needed climbing 3-5 steps with a railing? : Total 6 Click Score: 7    End of Session Equipment Utilized During Treatment: Gait belt;Oxygen Activity Tolerance: Patient limited by fatigue Patient left: in chair;with call bell/phone within reach;with chair alarm set   PT Visit Diagnosis: Other abnormalities of gait and mobility (R26.89);Muscle weakness (generalized) (M62.81)    Time: [id number] PT Time Calculation (min) (ACUTE ONLY): 35 min   Charges:    PT Evaluation $PT Eval Moderate Complexity: 1 Mod PT Treatments $Therapeutic Activity: 8-22 mins        Audrey Collier, PT Acute Rehabilitation Services [phone] 10/08/2018   Audrey Collier 10/08/2018, 1:14 PM

## 2018-10-09 ENCOUNTER — Inpatient Hospital Stay (HOSPITAL_COMMUNITY): Payer: Medicaid Other

## 2018-10-09 LAB — CBC
HCT: 30.9 % — ABNORMAL LOW (ref 36.0–46.0)
Hemoglobin: 9.6 g/dL — ABNORMAL LOW (ref 12.0–15.0)
MCH: 28.7 pg (ref 26.0–34.0)
MCHC: 31.1 g/dL (ref 30.0–36.0)
MCV: 92.2 fL (ref 80.0–100.0)
Platelets: 71 10*3/uL — ABNORMAL LOW (ref 150–400)
RBC: 3.35 MIL/uL — ABNORMAL LOW (ref 3.87–5.11)
RDW: 16.8 % — ABNORMAL HIGH (ref 11.5–15.5)
WBC: 17.7 10*3/uL — ABNORMAL HIGH (ref 4.0–10.5)
nRBC: 0.2 % (ref 0.0–0.2)

## 2018-10-09 LAB — BASIC METABOLIC PANEL
Anion gap: 12 (ref 5–15)
BUN: 35 mg/dL — ABNORMAL HIGH (ref 6–20)
CO2: 23 mmol/L (ref 22–32)
Calcium: 7.8 mg/dL — ABNORMAL LOW (ref 8.9–10.3)
Chloride: 118 mmol/L — ABNORMAL HIGH (ref 98–111)
Creatinine, Ser: 1.92 mg/dL — ABNORMAL HIGH (ref 0.44–1.00)
GFR calc Af Amer: 33 mL/min — ABNORMAL LOW (ref >60)
GFR calc non Af Amer: 28 mL/min — ABNORMAL LOW (ref >60)
Glucose, Bld: 104 mg/dL — ABNORMAL HIGH (ref 70–99)
Potassium: 3.5 mmol/L (ref 3.5–5.1)
Sodium: 153 mmol/L — ABNORMAL HIGH (ref 135–145)

## 2018-10-09 LAB — GLUCOSE, CAPILLARY
Glucose-Capillary: 107 mg/dL — ABNORMAL HIGH (ref 70–99)
Glucose-Capillary: 113 mg/dL — ABNORMAL HIGH (ref 70–99)
Glucose-Capillary: 78 mg/dL (ref 70–99)
Glucose-Capillary: 80 mg/dL (ref 70–99)
Glucose-Capillary: 89 mg/dL (ref 70–99)
Glucose-Capillary: 95 mg/dL (ref 70–99)

## 2018-10-09 MED ORDER — ALBUMIN HUMAN 25 % IV SOLN
12.5000 g | Freq: Once | INTRAVENOUS | Status: AC
  Administered 2018-10-09: 12.5 g via INTRAVENOUS
  Filled 2018-10-09: qty 100

## 2018-10-09 MED ORDER — POTASSIUM CHLORIDE CRYS ER 20 MEQ PO TBCR
20.0000 meq | EXTENDED_RELEASE_TABLET | ORAL | Status: AC
  Administered 2018-10-09 (×3): 20 meq via ORAL
  Filled 2018-10-09 (×3): qty 1

## 2018-10-09 MED ORDER — SODIUM CHLORIDE 0.9 % IV SOLN
INTRAVENOUS | Status: DC | PRN
  Administered 2018-10-09 – 2018-10-10 (×2): 250 mL via INTRAVENOUS
  Administered 2018-10-19 – 2018-11-10 (×3): 500 mL via INTRAVENOUS
  Administered 2018-11-13: 1000 mL via INTRAVENOUS
  Administered 2018-11-30: 500 mL via INTRAVENOUS

## 2018-10-09 NOTE — Progress Notes (Signed)
Attempted to mobilize patient with hopes of ambulation.  Three RN's present to assist patient to edge of bed mobilization.  No patient effort and patient unable to sit unassisted on side of bed.  RN's unable to ambulate patient but attempted to utilize stedy to get patient to chair.  Patient able to place feet on stedy and grab the bar on the stedy but patient had no effort in standing.  RN's tried to utilize a gait belt and patient's bed pad to provide maximum assistance to get patient to stand but was unable to mobilize patient.  Patient then had a large BM during mobility.   RN's placed patient back in bed and bathed patient.

## 2018-10-09 NOTE — Progress Notes (Signed)
      [address]       [place] [address]             [phone]                 2 Days Post-Op Procedure(s) (LRB): AORTIC VALVE REPLACEMENT (AVR) (N/A) TRANSESOPHAGEAL ECHOCARDIOGRAM (TEE) (N/A)   Events: Doing well.  Very weak _______________________________________________________________ Vitals: BP 133/82 Comment: Simultaneous filing. User may not have seen previous data.  Pulse 89 Comment: Simultaneous filing. User may not have seen previous data.  Temp 99.1 F (37.3 C) (Oral)   Resp (!) 22 Comment: Simultaneous filing. User may not have seen previous data.  Ht 5\' 2"  (1.575 m)   Wt 90 kg   LMP 04/16/2013 (Approximate)   SpO2 100% Comment: Simultaneous filing. User may not have seen previous data.  BMI 36.29 kg/m   - Neuro: alert NAD    - Cardiovascular: Sinus, regular.dressings clean  Drips: none    - Pulm: on 3L.  coarseBS    - Abd: distended - Extremity: + edema  .Intake/Output      08/08 0701 - 08/09 0700 08/09 0701 - 08/10 0700   P.O. 720 360   I.V. (mL/kg) 956.7 (10.6)    Blood     NG/GT     IV Piggyback 699.9    Total Intake(mL/kg) 2376.6 (26.4) 360 (4)   Urine (mL/kg/hr) 1300 (0.6) 200 (1.3)   Emesis/NG output     Blood     Chest Tube 50    Total Output 1350 200   Net +1026.6 +160        Urine Occurrence 2 x    Stool Occurrence 1 x       _______________________________________________________________ Labs: CBC Latest Ref Rng & Units 10/09/2018 10/08/2018 10/08/2018  WBC 4.0 - 10.5 K/uL 17.7(H) 16.0(H) -  Hemoglobin 12.0 - 15.0 g/dL 9.6(L) 9.5(L) 9.2(L)  Hematocrit 36.0 - 46.0 % 30.9(L) 30.4(L) 27.0(L)  Platelets 150 - 400 K/uL 71(L) 68(L) -   CMP Latest Ref Rng & Units 10/09/2018 10/08/2018 10/08/2018  Glucose 70 - 99 mg/dL 104(H) 126(H) -  BUN 6 - 20 mg/dL 35(H) 33(H) -  Creatinine 0.44 - 1.00 mg/dL 1.92(H) 1.83(H) -  Sodium 135 - 145 mmol/L 153(H) 154(H) 158(H)  Potassium 3.5 - 5.1 mmol/L 3.5 2.9(L) 3.5  Chloride 98 -  111 mmol/L 118(H) 122(H) -  CO2 22 - 32 mmol/L 23 23 -  Calcium 8.9 - 10.3 mg/dL 7.8(L) 8.1(L) -  Total Protein 6.5 - 8.1 g/dL - - -  Total Bilirubin 0.3 - 1.2 mg/dL - - -  Alkaline Phos 38 - 126 U/L - - -  AST 15 - 41 U/L - - -  ALT 0 - 44 U/L - - -    CXR:   _______________________________________________________________  Assessment and Plan: POD 2 s/p AVR with 47mm Inspiris valve for MSSA endocarditis.  Hx of developmental delays  Neuro: stable.  Nonfocal.  Pain control CV: off all drips.  Sinus rhythm.   Pulm: bilateral infiltrates due to pneumonia.  Aggressive pulm toilet Renal: creat trending up, good uop.  Will d/c lasix and give 25% albumin.  Will continue to follow GI: will advance diet today Heme: stable ID: MSSA endocarditis, on nafcillin.  Leukocytosis stable Endo: transition to SSI Dispo: continue ICU care to watch creat  Melodie Bouillon, MD 10/09/2018 8:41 AM

## 2018-10-10 ENCOUNTER — Encounter (HOSPITAL_COMMUNITY): Payer: Self-pay | Admitting: Thoracic Surgery (Cardiothoracic Vascular Surgery)

## 2018-10-10 DIAGNOSIS — Z9889 Other specified postprocedural states: Secondary | ICD-10-CM

## 2018-10-10 DIAGNOSIS — R7989 Other specified abnormal findings of blood chemistry: Secondary | ICD-10-CM

## 2018-10-10 DIAGNOSIS — Z952 Presence of prosthetic heart valve: Secondary | ICD-10-CM

## 2018-10-10 DIAGNOSIS — L8990 Pressure ulcer of unspecified site, unspecified stage: Secondary | ICD-10-CM | POA: Insufficient documentation

## 2018-10-10 LAB — CBC
HCT: 35.3 % — ABNORMAL LOW (ref 36.0–46.0)
Hemoglobin: 11 g/dL — ABNORMAL LOW (ref 12.0–15.0)
MCH: 28.8 pg (ref 26.0–34.0)
MCHC: 31.2 g/dL (ref 30.0–36.0)
MCV: 92.4 fL (ref 80.0–100.0)
Platelets: 104 10*3/uL — ABNORMAL LOW (ref 150–400)
RBC: 3.82 MIL/uL — ABNORMAL LOW (ref 3.87–5.11)
RDW: 17.3 % — ABNORMAL HIGH (ref 11.5–15.5)
WBC: 22.1 10*3/uL — ABNORMAL HIGH (ref 4.0–10.5)
nRBC: 0.2 % (ref 0.0–0.2)

## 2018-10-10 LAB — BPAM RBC
Blood Product Expiration Date: 202008312359
Blood Product Expiration Date: 202008312359
Blood Product Expiration Date: 202008312359
Blood Product Expiration Date: 202008312359
Blood Product Expiration Date: 202009012359
Blood Product Expiration Date: 202009022359
ISSUE DATE / TIME: 202008070831
ISSUE DATE / TIME: 202008070831
ISSUE DATE / TIME: 202008071018
ISSUE DATE / TIME: 202008071018
Unit Type and Rh: 5100
Unit Type and Rh: 5100
Unit Type and Rh: 5100
Unit Type and Rh: 5100
Unit Type and Rh: 5100
Unit Type and Rh: 5100

## 2018-10-10 LAB — GLUCOSE, CAPILLARY
Glucose-Capillary: 111 mg/dL — ABNORMAL HIGH (ref 70–99)
Glucose-Capillary: 141 mg/dL — ABNORMAL HIGH (ref 70–99)
Glucose-Capillary: 141 mg/dL — ABNORMAL HIGH (ref 70–99)
Glucose-Capillary: 151 mg/dL — ABNORMAL HIGH (ref 70–99)
Glucose-Capillary: 94 mg/dL (ref 70–99)
Glucose-Capillary: 98 mg/dL (ref 70–99)

## 2018-10-10 LAB — TYPE AND SCREEN
ABO/RH(D): O POS
Antibody Screen: NEGATIVE
Unit division: 0
Unit division: 0
Unit division: 0
Unit division: 0
Unit division: 0
Unit division: 0

## 2018-10-10 LAB — BASIC METABOLIC PANEL
Anion gap: 11 (ref 5–15)
BUN: 40 mg/dL — ABNORMAL HIGH (ref 6–20)
CO2: 19 mmol/L — ABNORMAL LOW (ref 22–32)
Calcium: 7.5 mg/dL — ABNORMAL LOW (ref 8.9–10.3)
Chloride: 119 mmol/L — ABNORMAL HIGH (ref 98–111)
Creatinine, Ser: 2.19 mg/dL — ABNORMAL HIGH (ref 0.44–1.00)
GFR calc Af Amer: 28 mL/min — ABNORMAL LOW (ref >60)
GFR calc non Af Amer: 24 mL/min — ABNORMAL LOW (ref >60)
Glucose, Bld: 184 mg/dL — ABNORMAL HIGH (ref 70–99)
Potassium: 3.8 mmol/L (ref 3.5–5.1)
Sodium: 149 mmol/L — ABNORMAL HIGH (ref 135–145)

## 2018-10-10 LAB — CULTURE, BLOOD (ROUTINE X 2)
Culture: NO GROWTH
Culture: NO GROWTH
Special Requests: ADEQUATE

## 2018-10-10 MED ORDER — MUPIROCIN CALCIUM 2 % EX CREA
TOPICAL_CREAM | Freq: Every day | CUTANEOUS | Status: DC
  Administered 2018-10-10 – 2018-10-11 (×2): via TOPICAL
  Administered 2018-10-12: 1 via TOPICAL
  Administered 2018-10-13 – 2018-10-24 (×12): via TOPICAL
  Filled 2018-10-10: qty 15

## 2018-10-10 MED ORDER — FUROSEMIDE 10 MG/ML IJ SOLN
40.0000 mg | Freq: Once | INTRAMUSCULAR | Status: AC
  Administered 2018-10-10: 40 mg via INTRAVENOUS
  Filled 2018-10-10: qty 4

## 2018-10-10 MED ORDER — ENOXAPARIN SODIUM 30 MG/0.3ML ~~LOC~~ SOLN
30.0000 mg | Freq: Every day | SUBCUTANEOUS | Status: DC
  Administered 2018-10-10 – 2018-10-11 (×2): 30 mg via SUBCUTANEOUS
  Filled 2018-10-10 (×3): qty 0.3

## 2018-10-10 MED ORDER — ALBUMIN HUMAN 5 % IV SOLN
INTRAVENOUS | Status: AC
  Filled 2018-10-10: qty 250

## 2018-10-10 MED ORDER — CEFAZOLIN SODIUM-DEXTROSE 2-4 GM/100ML-% IV SOLN
2.0000 g | Freq: Two times a day (BID) | INTRAVENOUS | Status: DC
  Administered 2018-10-10 – 2018-10-11 (×4): 2 g via INTRAVENOUS
  Filled 2018-10-10 (×6): qty 100

## 2018-10-10 MED ORDER — ALBUMIN HUMAN 25 % IV SOLN
25.0000 g | Freq: Once | INTRAVENOUS | Status: AC
  Administered 2018-10-10: 25 g via INTRAVENOUS
  Filled 2018-10-10: qty 50

## 2018-10-10 MED ORDER — ENSURE ENLIVE PO LIQD
237.0000 mL | Freq: Three times a day (TID) | ORAL | Status: DC
  Administered 2018-10-10 – 2018-10-13 (×7): 237 mL via ORAL

## 2018-10-10 MED ORDER — ADULT MULTIVITAMIN W/MINERALS CH
1.0000 | ORAL_TABLET | Freq: Every day | ORAL | Status: DC
  Administered 2018-10-10 – 2018-11-05 (×24): 1 via ORAL
  Filled 2018-10-10 (×24): qty 1

## 2018-10-10 NOTE — Progress Notes (Signed)
Nutrition Follow-up  DOCUMENTATION CODES:   Not applicable  INTERVENTION:   - Ensure Enlive po TID, each supplement provides 350 kcal and 20 grams of protein  - MVI with minerals daily  NUTRITION DIAGNOSIS:   Inadequate oral intake related to inability to eat as evidenced by NPO status.  Progressing, pt now on Dysphagia 2 diet  GOAL:   Patient will meet greater than or equal to 90% of their needs  Progressing  MONITOR:   PO intake, Supplement acceptance, Diet advancement, Labs, Weight trends, I & O's, Skin  REASON FOR ASSESSMENT:   Consult Enteral/tube feeding initiation and management  ASSESSMENT:   58 yo female admitted with fever, vomiting, weakness. Found to have MSSA bacteremia with PNA. Echo showed vegetation. Required transfer to the ICU and intubation on 8/4. PMH includes developmental delay, GERD, anxiety, hypothyroidism, HLD.  8/06 - TEE showing large aortic valve vegetation 8/07 - s/p aortic valve replacement 8/08 - extubated, diet advanced to Dysphagia 2  Discussed pt with RN and during ICU rounds.  Spoke with pt at bedside. Pt in recliner. Pt reports that she does not have much of an appetite and ate "very little" at breakfast this AM. Pt reports that PTA, she had a good appetite and ate 2 "solid" meals daily.  Pt amenable to drinking an oral nutrition supplement to aid in meeting kcal and protein needs. RD to order.  Pt complaining of nausea. RD alerted RN. RN to administer Zofran.  Weight up 31 lbs when compared to first measured weight on 8/02. Pt is net positive 11 L since admission. RD will monitor trends. Pt with deep pitting edema to BUE and BLE.  Meal Completion: 0-20% x 5 meals (after diet advanced on 8/08)  Medications reviewed and include: Dulcolax, Oscal with D, Colace, SSI, Protonix, IV abx IVF: NS @ 10 ml/hr  Labs reviewed: sodium 149, chloride 119, BUN 40, creatinine 2.19 CBG's: 95-151 x 24 hours  UOP: 900 ml x 24 hours I/O's:  +11.0 L since admit  Diet Order:   Diet Order            DIET DYS 2 Room service appropriate? Yes; Fluid consistency: Thin  Diet effective now              EDUCATION NEEDS:   No education needs have been identified at this time  Skin:  Skin Assessment: Skin Integrity Issues: DTI: right ear Incisions: chest  Last BM:  10/10/18  Height:   Ht Readings from Last 1 Encounters:  10/07/18 5\' 2"  (1.575 m)    Weight:   Wt Readings from Last 1 Encounters:  10/10/18 91.9 kg    Ideal Body Weight:  56.8 kg  BMI:  Body mass index is 37.06 kg/m.  Estimated Nutritional Needs:   Kcal:  1800-2000  Protein:  100-115 gm  Fluid:  >/= 1.8 L    Gaynell Face, MS, RD, LDN Inpatient Clinical Dietitian Pager: [phone] Weekend/After Hours: [phone]

## 2018-10-10 NOTE — Progress Notes (Signed)
Physical Therapy Treatment Patient Details Name: Audrey Collier Mcelveen MRN: [id number] DOB: Nov 05, 1960 Today's Date: 10/10/2018    History of Present Illness 58 yo female presented with fever, vomiting, weakness.  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  Echo showed vegetation.  Transferred to ICU 8/04 due to respiratory failure.  Underwent AVR with TEE intraoprative on 10/07/18. Extubated 10/08/18.    PT Comments    Pt admitted with above diagnosis. Pt currently with functional limitations due to balance and endurance deficits. Pt needs mod to max assist to sit EOB due to posterior lean.  Pt needed +3 max assist to stand enough to use STedy.  Pt very weak and chart reported developmental delays which seem to decr her ability to follow commands at times.  Pt needs a lot of cues and assist. Pt will need extensive rehab prior to [place]/c home with family.    Pt will benefit from skilled PT to increase their independence and safety with mobility to allow discharge to the venue listed below.     Follow Up Recommendations  CIR     Equipment Recommendations  Other (comment)(TBA)    Recommendations for Other Services Rehab consult     Precautions / Restrictions Precautions Precautions: Fall;Sternal Restrictions Weight Bearing Restrictions: Yes(sternal precautions)    Mobility  Bed Mobility Overal bed mobility: Needs Assistance Bed Mobility: Supine to Sit     Supine to sit: HOB elevated;Max assist;Mod assist;+2 for physical assistance     General bed mobility comments: assist to guide legs to EOB and assist to lift trunk uprighjt, scooting to EOB with assist using pads under pt.  Pt with posterior lean.   Transfers Overall transfer level: Needs assistance Equipment used: Carley Hammed(Eva walker) Transfers: Sit to/from Stand Sit to Stand: Max assist;From elevated surface(+3 assist to get pts buttocks off bed)         General transfer comment:  [facility] without success as  needs too much assist to power up and cannot use UEs and decr use of LEs.  First 3 attempts to stand needing max assist of 2 and use of pad. 2nd two attempts without success of lifting buttocks enough.  Nurse present as pt was wet with urine and nurse was going to clean pt.  PT obtained blanket and placed under pts buttocks and PT stood in front of pt as nurse and OT got on the sides of pt and PT used blanket to achieve anterior pelvic tilt as nurse and OT assisted pt with STedy pads under buttocks.  Pt was able to stand enough with +3 support to get pads under her.  moved pt to the recliner then used the blanket again to get pt to stand to be cleaned by nursing with pt standing up to 30 seconds with this incr assist and cues to stand tall and upright as she tends to lean to her left.  Positioned in chair comfortably at end of treatment.    Ambulation/Gait             General Gait Details: unable   Stairs             Wheelchair Mobility    Modified Rankin (Stroke Patients Only)       Balance Overall balance assessment: Needs assistance Sitting-balance support: Bilateral upper extremity supported;Feet supported;No upper extremity supported Sitting balance-Leahy Scale: Poor Sitting balance - Comments: min to max A at least for sitting EOB due to leaning posterior and  to left heavily at times.  Pt lacks balance reactions in sitting needing cues and assist for support.  Postural control: Posterior lean;Left lateral lean Standing balance support: Bilateral upper extremity supported;During functional activity Standing balance-Leahy Scale: Zero Standing balance comment: unable to achieve full upright standing even with +3 assist.  Had to use the blanket around buttocks and 2 other staff to get pt semi-upright                            Cognition Arousal/Alertness: Awake/alert Behavior During Therapy: Flat affect Overall Cognitive Status: No family/caregiver present to  determine baseline cognitive functioning                                 General Comments: Chart states pt with developmental delays      Exercises      General Comments General comments (skin integrity, edema, etc.): VSS during treatment.  Did c/o dizziness upon sitting, however BP was normal      Pertinent Vitals/Pain Pain Assessment: Faces Faces Pain Scale: Hurts even more Pain Location: generalized, with grimacing, but denies and does not localize Pain Descriptors / Indicators: Grimacing Pain Intervention(s): Limited activity within patient's tolerance;Monitored during session;Repositioned    Home Living                      Prior Function            PT Goals (current goals can now be found in the care plan section) Acute Rehab PT Goals Patient Stated Goal: agreeable to get stronger Progress towards PT goals: Progressing toward goals    Frequency    Min 3X/week      PT Plan Current plan remains appropriate    Co-evaluation PT/OT/SLP Co-Evaluation/Treatment: Yes Reason for Co-Treatment: Complexity of the patient's impairments (multi-system involvement);For patient/therapist safety;Necessary to address cognition/behavior during functional activity PT goals addressed during session: Mobility/safety with mobility        AM-PAC PT "6 Clicks" Mobility   Outcome Measure  Help needed turning from your back to your side while in a flat bed without using bedrails?: A Lot Help needed moving from lying on your back to sitting on the side of a flat bed without using bedrails?: A Lot Help needed moving to and from a bed to a chair (including a wheelchair)?: Total Help needed standing up from a chair using your arms (e.g., wheelchair or bedside chair)?: A Lot Help needed to walk in hospital room?: Total Help needed climbing 3-5 steps with a railing? : Total 6 Click Score: 9    End of Session Equipment Utilized During Treatment: Gait  belt Activity Tolerance: Patient limited by fatigue Patient left: in chair;with call bell/phone within reach;with chair alarm set Nurse Communication: Mobility status;Need for lift equipment(placed maxi move under pt and nurse aware) PT Visit Diagnosis: Other abnormalities of gait and mobility (R26.89);Muscle weakness (generalized) (M62.81);Unsteadiness on feet (R26.81)     Time: 9735-3299 PT Time Calculation (min) (ACUTE ONLY): 36 min  Charges:  $Therapeutic Activity: 8-22 mins                     [place] Pager:  [phone]  Office:  [place] 10/10/2018, 11:20 AM

## 2018-10-10 NOTE — Progress Notes (Signed)
      [address]       [place] [address]             [phone]                 3 Days Post-Op Procedure(s) (LRB): AORTIC VALVE REPLACEMENT (AVR) (N/A) TRANSESOPHAGEAL ECHOCARDIOGRAM (TEE) (N/A)   Events: Doing well.  Off supplemental O2 _______________________________________________________________ Vitals: BP (!) 111/59   Pulse 93   Temp 97.9 F (36.6 C) (Oral)   Resp (!) 23   Ht 5\' 2"  (1.575 m)   Wt 91.9 kg   LMP 04/16/2013 (Approximate)   SpO2 100%   BMI 37.06 kg/m   - Neuro: alert NAD    - Cardiovascular: Sinus, regular.dressings clean  Drips: none    - Pulm: Coarse BS    - Abd: distended - Extremity: + edema  .Intake/Output      08/09 0701 - 08/10 0700 08/10 0701 - 08/11 0700   P.O. 1320    I.V. (mL/kg) 755.3 (8.2)    IV Piggyback 539.4    Total Intake(mL/kg) 2614.7 (28.5)    Urine (mL/kg/hr) 900 (0.4)    Stool 0    Chest Tube     Total Output 900    Net +1714.7         Urine Occurrence 4 x    Stool Occurrence 3 x       _______________________________________________________________ Labs: CBC Latest Ref Rng & Units 10/10/2018 10/09/2018 10/08/2018  WBC 4.0 - 10.5 K/uL 22.1(H) 17.7(H) 16.0(H)  Hemoglobin 12.0 - 15.0 g/dL 11.0(L) 9.6(L) 9.5(L)  Hematocrit 36.0 - 46.0 % 35.3(L) 30.9(L) 30.4(L)  Platelets 150 - 400 K/uL 104(L) 71(L) 68(L)   CMP Latest Ref Rng & Units 10/10/2018 10/09/2018 10/08/2018  Glucose 70 - 99 mg/dL 184(H) 104(H) 126(H)  BUN 6 - 20 mg/dL 40(H) 35(H) 33(H)  Creatinine 0.44 - 1.00 mg/dL 2.19(H) 1.92(H) 1.83(H)  Sodium 135 - 145 mmol/L 149(H) 153(H) 154(H)  Potassium 3.5 - 5.1 mmol/L 3.8 3.5 2.9(L)  Chloride 98 - 111 mmol/L 119(H) 118(H) 122(H)  CO2 22 - 32 mmol/L 19(L) 23 23  Calcium 8.9 - 10.3 mg/dL 7.5(L) 7.8(L) 8.1(L)  Total Protein 6.5 - 8.1 g/dL - - -  Total Bilirubin 0.3 - 1.2 mg/dL - - -  Alkaline Phos 38 - 126 U/L - - -  AST 15 - 41 U/L - - -  ALT 0 - 44 U/L - - -    CXR:  None today  _______________________________________________________________  Assessment and Plan: POD 3 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  Hx of developmental delays  Neuro: stable.  Nonfocal.  Pain control CV: off all drips.  Sinus rhythm.   Pulm:  Aggressive pulm toilet Renal: creat trending up, good uop. Will need to renally adjust antibiotics.  On no other nephrotoxic agents GI: on diet Heme: stable ID: MSSA endocarditis, on nafcillin and rifampin.  Trending up Endo: transition to SSI Dispo: continue ICU care to watch creat  Melodie Bouillon, MD 10/10/2018 7:45 AM

## 2018-10-10 NOTE — Progress Notes (Signed)
Rehab Admissions Coordinator Note:  Per therapy recommendations patient was screened by Michel Santee for appropriateness for an Inpatient Acute Rehab Consult.  At this time, we are recommending Inpatient Rehab consult. Please place a consult order if pt would like to be considered.   Michel Santee 10/10/2018, 9:27 AM  I can be reached at [phone]

## 2018-10-10 NOTE — Progress Notes (Signed)
CT Surgery PM Rounds  Walked in hall NSR, 97% on RA Urine output down, creat 2.2, BP high, wt up- will give 40 lasix this pm Nafcillin changed to Ancef by ID Check creat in am

## 2018-10-10 NOTE — Consult Note (Addendum)
[facility] Nurse wound consult note Reason for Consult:  Consult requested for right ear. Wound type: Dark reddish purple deep tissue pressure injury to right outer ear, beginning to loosen and peel to outer edges Pressure Injury POA: No Measurement: .5X.5cm Dressing procedure/placement/frequency: Bactroban to promote moist healing.  Foam dressing is already in place to protect from further injury.  DTPI are high risk to evove into full thickness tissue loss. Pt is critically ill with multiple systemic factors which can impair healing.  [facility] team will assess the site weekly to determine if a change in the plan of care is indicated at that time. Julien Girt MSN, RN, [place], [place], [place]

## 2018-10-10 NOTE — Evaluation (Signed)
Occupational Therapy Evaluation Patient Details Name: Audrey Col[place] MRN: [id number] DOB: Apr 30, 1960 Today's Date: 10/10/2018    History of Present Illness 58 yo female presented with fever, vomiting, weakness.  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  Echo showed vegetation.  Transferred to ICU 8/04 due to respiratory failure.  Underwent AVR with TEE intraoprative on 10/07/18. Extubated 10/08/18.   Clinical Impression   Patient admitted for above and limited by problem list below, including impaired balance, decreased activity tolerance, generalized weakness, impaired cognition, and edema.  Patient reports living with her sister PTA, ambulating without assist but needing assist for ADls (per chart has aide 2hrs/day).  Patient currently requires max assist +2 for bed mobility, mod assist for UB ADLs, max-total assist for LB ADLs, total assist for toileting (+3 assist) and +3 assist for transfers using stedy today.  Patient highly fearful of falling, hesitant for mobility and resistive to standing at times. Cueing for sternal precautions during session, poor carryover throughout session. Patient will benefit from intensive CIR level rehab in order to maximize independence and safety prior to dc home with 24/7 support.  Will follow.      Follow Up Recommendations  CIR;Supervision/Assistance - 24 hour    Equipment Recommendations  3 in 1 bedside commode    Recommendations for Other Services Rehab consult     Precautions / Restrictions Precautions Precautions: Fall;Sternal Precaution Comments: verbally reviewed with patient during functional tasks  Restrictions Weight Bearing Restrictions: Yes Other Position/Activity Restrictions: sternal precautions       Mobility Bed Mobility Overal bed mobility: Needs Assistance Bed Mobility: Supine to Sit     Supine to sit: HOB elevated;Max assist;Mod assist;+2 for physical assistance     General bed mobility comments: assist to guide legs  to EOB and assist to lift trunk uprighjt, scooting to EOB with assist using pads under pt.  Pt with posterior lean.   Transfers Overall transfer level: Needs assistance Equipment used: (eva walker ) Transfers: Sit to/from Stand Sit to Stand: Max assist;From elevated surface(+3 to raise buttocks from EOB)         General transfer comment:  [facility] without success as needs too much assist to power up and cannot use UEs and decr use of LEs.  First 3 attempts to stand needing max assist of 2 and use of pad. 2nd two attempts without success of lifting buttocks enough.  Nurse present as pt was wet with urine and nurse was going to clean pt.  PT obtained blanket and placed under pts buttocks and PT stood in front of pt as nurse and OT got on the sides of pt and PT used blanket to achieve anterior pelvic tilt as nurse and OT assisted pt with STedy pads under buttocks.  Pt was able to stand enough with +3 support to get pads under her.  moved pt to the recliner then used the blanket again to get pt to stand to be cleaned by nursing with pt standing up to 30 seconds with this incr assist and cues to stand tall and upright as she tends to lean to her left.  Positioned in chair comfortably at end of treatment.      Balance Overall balance assessment: Needs assistance Sitting-balance support: Bilateral upper extremity supported;Feet supported;No upper extremity supported Sitting balance-Leahy Scale: Poor Sitting balance - Comments: min to max A at least for sitting EOB due to leaning posterior and to left heavily at times.  Pt lacks balance reactions in sitting needing cues and assist for support.  Postural control: Posterior lean;Left lateral lean Standing balance support: Bilateral upper extremity supported;During functional activity Standing balance-Leahy Scale: Zero Standing balance comment: unable to achieve full upright standing even with +3 assist.  Had to use the blanket  around buttocks and 2 other staff to get pt semi-upright                           ADL either performed or assessed with clinical judgement   ADL Overall ADL's : Needs assistance/impaired     Grooming: Moderate assistance;Sitting   Upper Body Bathing: Moderate assistance;Sitting   Lower Body Bathing: Total assistance;+2 for physical assistance;Sit to/from stand   Upper Body Dressing : Maximal assistance;Sitting   Lower Body Dressing: Total assistance;+2 for physical assistance;Sit to/from stand   Toilet Transfer: Total assistance;+2 for physical assistance;+2 for safety/equipment(using stedy) Toilet Transfer Details (indicate cue type and reason): simulated to recliner using stedy Toileting- Clothing Manipulation and Hygiene: Total assistance;+2 for physical assistance       Functional mobility during ADLs: Total assistance General ADL Comments: patient limited by generalized weakness, impaired balance, decreased activity tolerance, cognition, and fear of falling      Vision         Perception     Praxis      Pertinent Vitals/Pain Pain Assessment: Faces Faces Pain Scale: Hurts even more Pain Location: generalized, with grimacing, but denies and does not localize Pain Descriptors / Indicators: Grimacing Pain Intervention(s): Monitored during session;Repositioned;Limited activity within patient's tolerance     Hand Dominance Right   Extremity/Trunk Assessment Upper Extremity Assessment Upper Extremity Assessment: RUE deficits/detail;LUE deficits/detail RUE Deficits / Details: AAROM to about 90 shoulder flexion, noted significant edema in UE's, grips weakly RUE Coordination: decreased fine motor;decreased gross motor LUE Deficits / Details: AAROM to about 90 shoulder flexion, noted significant edema in UE's, grips weakly LUE Coordination: decreased fine motor;decreased gross motor   Lower Extremity Assessment Lower Extremity Assessment: Defer to PT  evaluation       Communication Communication Communication: Expressive difficulties(limited verbalizations )   Cognition Arousal/Alertness: Awake/alert Behavior During Therapy: Flat affect Overall Cognitive Status: No family/caregiver present to determine baseline cognitive functioning                                 General Comments: hx of cognitive deficits per chart, patient oriented to self and place (requires options for time), she is able to follow commands inconsistently; highly fearful during session   General Comments  VSS, reports dizziness but BP WFL (supine 124/76, EOB 115/72, in stedy 134/68)    Exercises     Shoulder Instructions      Home Living Family/patient expects to be discharged to:: Private residence Living Arrangements: Other relatives Available Help at Discharge: Family;Available 24 hours/day Type of Home: House       Home Layout: One level     Bathroom Shower/Tub: Teacher, early years/pre: Standard     Home Equipment: None   Additional Comments: pt reports living with her sister Audrey Collier and her husband       Prior Functioning/Environment          Comments: reports needing assist with ADLs, but able to walk without walker; per chart has aide 2hrs/day        OT Problem List: Decreased strength;Impaired  balance (sitting and/or standing);Decreased activity tolerance;Decreased safety awareness;Decreased knowledge of use of DME or AE;Pain;Increased edema;Cardiopulmonary status limiting activity;Decreased knowledge of precautions;Decreased cognition;Decreased coordination;Decreased range of motion      OT Treatment/Interventions: Self-care/ADL training;DME and/or AE instruction;Therapeutic activities;Patient/family education;Balance training;Cognitive remediation/compensation;Therapeutic exercise    OT Goals(Current goals can be found in the care plan section) Acute Rehab OT Goals Patient Stated Goal: agreeable to get  stronger OT Goal Formulation: With patient Time For Goal Achievement: 10/24/18 Potential to Achieve Goals: Good  OT Frequency: Min 2X/week   Barriers to D/C:            Co-evaluation PT/OT/SLP Co-Evaluation/Treatment: Yes Reason for Co-Treatment: Complexity of the patient's impairments (multi-system involvement);Necessary to address cognition/behavior during functional activity;For patient/therapist safety;To address functional/ADL transfers PT goals addressed during session: Mobility/safety with mobility OT goals addressed during session: ADL's and self-care      AM-PAC OT "6 Clicks" Daily Activity     Outcome Measure Help from another person eating meals?: A Little Help from another person taking care of personal grooming?: A Little Help from another person toileting, which includes using toliet, bedpan, or urinal?: Total Help from another person bathing (including washing, rinsing, drying)?: A Lot Help from another person to put on and taking off regular upper body clothing?: A Lot Help from another person to put on and taking off regular lower body clothing?: Total 6 Click Score: 12   End of Session Equipment Utilized During Treatment: Gait belt;Other (comment)(stedy) Nurse Communication: Mobility status;Need for lift equipment;Precautions  Activity Tolerance: Patient tolerated treatment well Patient left: in chair;with call bell/phone within reach;with chair alarm set;with nursing/sitter in room  OT Visit Diagnosis: Other abnormalities of gait and mobility (R26.89);Muscle weakness (generalized) (M62.81);Pain Pain - part of body: (generalized)                Time: [id number] OT Time Calculation (min): 35 min Charges:  OT General Charges $OT Visit: 1 Visit OT Evaluation $OT Eval Moderate Complexity: 1 Mod  Chancy Milroyhristie S Juliauna Stueve, OT Acute Rehabilitation Services Pager [phone] Office [phone]   Chancy MilroyChristie S Elin Seats 10/10/2018, 12:51 PM

## 2018-10-10 NOTE — Progress Notes (Signed)
Subjective:  " I want to lie down"   Antibiotics:  Anti-infectives (From admission, onward)   Start     Dose/Rate Route Frequency Ordered Stop   10/10/18 1300  ceFAZolin (ANCEF) IVPB 2g/100 mL premix     2 g 200 mL/hr over 30 Minutes Intravenous Every 12 hours 10/10/18 1147     10/08/18 1400  rifampin (RIFADIN) 60 mg/mL oral suspension 300 mg     300 mg Oral Every 8 hours 10/08/18 0839     10/07/18 2200  rifampin (RIFADIN) 60 mg/mL oral suspension 300 mg  Status:  Discontinued     300 mg Per Tube Every 8 hours 10/07/18 1714 10/08/18 0839   10/07/18 2000  vancomycin (VANCOCIN) IVPB 1000 mg/200 mL premix     1,000 mg 200 mL/hr over 60 Minutes Intravenous  Once 10/07/18 1336 10/07/18 2158   10/07/18 0430  cefUROXime (ZINACEF) 750 mg in sodium chloride 0.9 % 100 mL IVPB  Status:  Discontinued     750 mg 200 mL/hr over 30 Minutes Intravenous To Surgery 10/06/18 1531 10/07/18 1248   10/07/18 0400  vancomycin (VANCOCIN) 1,000 mg in sodium chloride 0.9 % 1,000 mL irrigation      Irrigation To Surgery 10/06/18 1415 10/07/18 0932   10/07/18 0400  vancomycin (VANCOCIN) 1,250 mg in sodium chloride 0.9 % 250 mL IVPB     1,250 mg 166.7 mL/hr over 90 Minutes Intravenous To Surgery 10/06/18 1415 10/07/18 0800   10/07/18 0400  cefUROXime (ZINACEF) 1.5 g in sodium chloride 0.9 % 100 mL IVPB     1.5 g 200 mL/hr over 30 Minutes Intravenous To Surgery 10/06/18 1415 10/07/18 1129   10/06/18 1430  cefUROXime (ZINACEF) 750 mg in sodium chloride 0.9 % 100 mL IVPB  Status:  Discontinued     750 mg 200 mL/hr over 30 Minutes Intravenous To Surgery 10/06/18 1415 10/06/18 1532   10/05/18 1300  nafcillin 2 g in sodium chloride 0.9 % 100 mL IVPB  Status:  Discontinued     2 g 200 mL/hr over 30 Minutes Intravenous Every 4 hours 10/05/18 1109 10/10/18 1147   10/02/18 1200  ceFAZolin (ANCEF) IVPB 2g/100 mL premix  Status:  Discontinued     2 g 200 mL/hr over 30 Minutes Intravenous Every 8 hours  10/02/18 1040 10/05/18 1109   10/01/18 2000  cefTRIAXone (ROCEPHIN) 2 g in sodium chloride 0.9 % 100 mL IVPB  Status:  Discontinued     2 g 200 mL/hr over 30 Minutes Intravenous Every 24 hours 10/01/18 1958 10/02/18 1040   10/01/18 2000  azithromycin (ZITHROMAX) 500 mg in sodium chloride 0.9 % 250 mL IVPB  Status:  Discontinued     500 mg 250 mL/hr over 60 Minutes Intravenous Every 24 hours 10/01/18 1958 10/02/18 1040      Medications: Scheduled Meds:  sodium chloride   Intravenous Once   acetaminophen  1,000 mg Oral Q6H   Or   acetaminophen (TYLENOL) oral liquid 160 mg/5 mL  1,000 mg Per Tube Q6H   aspirin EC  325 mg Oral Daily   Or   aspirin  324 mg Per Tube Daily   bisacodyl  10 mg Oral Daily   Or   bisacodyl  10 mg Rectal Daily   calcium-vitamin D  1 tablet Oral Q breakfast   Chlorhexidine Gluconate Cloth  6 each Topical Daily   docusate sodium  200 mg Oral Daily   enoxaparin (LOVENOX) injection  30 mg Subcutaneous QHS   feeding supplement (ENSURE ENLIVE)  237 mL Oral TID BM   FLUoxetine  10 mg Oral QHS   insulin aspart  0-24 Units Subcutaneous Q4H   levothyroxine  75 mcg Intravenous Daily   mouth rinse  15 mL Mouth Rinse BID   metoprolol tartrate  12.5 mg Oral BID   montelukast  10 mg Oral QHS   multivitamin with minerals  1 tablet Oral Daily   mupirocin cream   Topical Daily   pantoprazole  40 mg Oral Daily   rifampin  300 mg Oral Q8H   sodium chloride flush  10-40 mL Intracatheter Q12H   sodium chloride flush  3 mL Intravenous Q12H   Continuous Infusions:  sodium chloride 10 mL/hr at 10/10/18 1000    ceFAZolin (ANCEF) IV 2 g (10/10/18 1401)   lactated ringers Stopped (10/08/18 0841)   lactated ringers Stopped (10/09/18 0919)   lactated ringers 20 mL/hr at 10/09/18 1000   PRN Meds:.sodium chloride, metoprolol tartrate, morphine injection, ondansetron (ZOFRAN) IV, oxyCODONE, sodium chloride flush, sodium chloride flush,  traMADol    Objective: Weight change: 1.9 kg  Intake/Output Summary (Last 24 hours) at 10/10/2018 1559 Last data filed at 10/10/2018 1300 Gross per 24 hour  Intake 1499.08 ml  Output 800 ml  Net 699.08 ml   Blood pressure 122/78, pulse (!) 106, temperature 98.3 F (36.8 C), temperature source Oral, resp. rate (!) 28, height 5\' 2"  (1.575 m), weight 91.9 kg, last menstrual period 04/16/2013, SpO2 96 %. Temp:  [97.9 F (36.6 C)-99.1 F (37.3 C)] 98.3 F (36.8 C) (08/10 1529) Pulse Rate:  [81-112] 106 (08/10 1500) Resp:  [16-30] 28 (08/10 1500) BP: (94-128)/(59-84) 122/78 (08/10 1500) SpO2:  [93 %-100 %] 96 % (08/10 1500) Weight:  [91.9 kg] 91.9 kg (08/10 0500)  Physical Exam: General: Alert and awake , oriented person,  HEENT: anicteric sclera, EOMI CVS tachy , sternotomy clean Chest: ,fairly clear Abdomen: soft non-distended,  Skin: no rashes Neuro: nonfocal  CBC:    BMET Recent Labs    10/09/18 0330 10/10/18 0232  NA 153* 149*  K 3.5 3.8  CL 118* 119*  CO2 23 19*  GLUCOSE 104* 184*  BUN 35* 40*  CREATININE 1.92* 2.19*  CALCIUM 7.8* 7.5*     Liver Panel  No results for input(s): PROT, ALBUMIN, AST, ALT, ALKPHOS, BILITOT, BILIDIR, IBILI in the last 72 hours.     Sedimentation Rate No results for input(s): ESRSEDRATE in the last 72 hours. C-Reactive Protein No results for input(s): CRP in the last 72 hours.  Micro Results: Recent Results (from the past 720 hour(s))  Blood Culture (routine x 2)     Status: Abnormal   Collection Time: 10/01/18  6:20 PM   Specimen: Blood  Result Value Ref Range Status   Specimen Description   Final    BLOOD RIGHT ANTECUBITAL Performed at [facility], [address], [place], [place] [address]    Special Requests   Final    BOTTLES DRAWN AEROBIC AND ANAEROBIC Blood Culture adequate volume Performed at [facility], [address] [place], [place] [address]    Culture  Setup Time   Final    GRAM  POSITIVE COCCI Gram Stain Report Called to,Read Back By and Verified With: PERRY,C. AT 0516 ON 10/02/2018 BY EVA ANAEROBIC BOTTLE ONLY Performed at [facility] GRAM POSITIVE COCCI RESULT PREVIOUSLY CALLED  AERO BOTTLE Performed at [facility], [address] [place], [place] [address]    Culture (A)  Final    STAPHYLOCOCCUS AUREUS SUSCEPTIBILITIES PERFORMED ON PREVIOUS CULTURE WITHIN THE LAST 5 DAYS. Performed at [facility], [address] [place], [place] [address]    Report Status 10/04/2018 FINAL  Final  Blood Culture (routine x 2)     Status: Abnormal   Collection Time: 10/01/18  6:28 PM   Specimen: Blood  Result Value Ref Range Status   Specimen Description BLOOD LEFT ANTECUBITAL  Final   Special Requests   Final    BOTTLES DRAWN AEROBIC AND ANAEROBIC Blood Culture adequate volume   Culture  Setup Time   Final    GRAM POSITIVE COCCI Gram Stain Report Called to,Read Back By and Verified With: PERRY,C. AT 0516 ON 10/02/2018 BY EVA AEROBIC BOTTLE ONLY Performed at [facility] CRITICAL RESULT CALLED TO, READ BACK BY AND VERIFIED WITH: Hollie Beach [id number] MLM Performed at [facility], [address], [place], [place] [address]    Culture STAPHYLOCOCCUS AUREUS (A)  Final   Report Status 10/04/2018 FINAL  Final   Organism ID, Bacteria STAPHYLOCOCCUS AUREUS  Final      Susceptibility   Staphylococcus aureus - MIC*    CIPROFLOXACIN <=0.5 SENSITIVE Sensitive     ERYTHROMYCIN >=8 RESISTANT Resistant     GENTAMICIN <=0.5 SENSITIVE Sensitive     OXACILLIN <=0.25 SENSITIVE Sensitive     TETRACYCLINE <=1 SENSITIVE Sensitive     VANCOMYCIN <=0.5 SENSITIVE Sensitive     TRIMETH/SULFA <=10 SENSITIVE Sensitive     CLINDAMYCIN RESISTANT Resistant     RIFAMPIN <=0.5 SENSITIVE Sensitive     Inducible Clindamycin POSITIVE Resistant     * STAPHYLOCOCCUS AUREUS  Blood Culture ID Panel (Reflexed)     Status: Abnormal   Collection Time: 10/01/18  6:28 PM   Result Value Ref Range Status   Enterococcus species NOT DETECTED NOT DETECTED Final   Listeria monocytogenes NOT DETECTED NOT DETECTED Final   Staphylococcus species DETECTED (A) NOT DETECTED Final    Comment: CRITICAL RESULT CALLED TO, READ BACK BY AND VERIFIED WITH: PHARMD M MACCIA [id number] MLM    Staphylococcus aureus (BCID) DETECTED (A) NOT DETECTED Final    Comment: Methicillin (oxacillin) susceptible Staphylococcus aureus (MSSA). Preferred therapy is anti staphylococcal beta lactam antibiotic (Cefazolin or Nafcillin), unless clinically contraindicated. CRITICAL RESULT CALLED TO, READ BACK BY AND VERIFIED WITH: PHARMD M MACCIA [id number] 1034 MLM    Methicillin resistance NOT DETECTED NOT DETECTED Final   Streptococcus species NOT DETECTED NOT DETECTED Final   Streptococcus agalactiae NOT DETECTED NOT DETECTED Final   Streptococcus pneumoniae NOT DETECTED NOT DETECTED Final   Streptococcus pyogenes NOT DETECTED NOT DETECTED Final   Acinetobacter baumannii NOT DETECTED NOT DETECTED Final   Enterobacteriaceae species NOT DETECTED NOT DETECTED Final   Enterobacter cloacae complex NOT DETECTED NOT DETECTED Final   Escherichia coli NOT DETECTED NOT DETECTED Final   Klebsiella oxytoca NOT DETECTED NOT DETECTED Final   Klebsiella pneumoniae NOT DETECTED NOT DETECTED Final   Proteus species NOT DETECTED NOT DETECTED Final   Serratia marcescens NOT DETECTED NOT DETECTED Final   Haemophilus influenzae NOT DETECTED NOT DETECTED Final   Neisseria meningitidis NOT DETECTED NOT DETECTED Final   Pseudomonas aeruginosa NOT DETECTED NOT DETECTED Final   Candida albicans NOT DETECTED NOT DETECTED Final   Candida glabrata NOT DETECTED NOT DETECTED Final   Candida krusei NOT DETECTED NOT DETECTED Final   Candida parapsilosis  NOT DETECTED NOT DETECTED Final   Candida tropicalis NOT DETECTED NOT DETECTED Final    Comment: Performed at [facility], [address] [place], [place] [address]   Urine culture     Status: None   Collection Time: 10/01/18  6:34 PM   Specimen: In/Out Cath Urine  Result Value Ref Range Status   Specimen Description   Final    IN/OUT CATH URINE Performed at [facility], [address] [place], [place] [address]    Special Requests   Final    NONE Performed at [facility], [address] [place], [place] [address]    Culture   Final    NO GROWTH Performed at [facility], [address] [place], [place] [address]    Report Status 10/03/2018 FINAL  Final  SARS Coronavirus 2 [facility](Hospital order, Performed in [facility] hospital lab) Nasopharyngeal Nasopharyngeal Swab     Status: None   Collection Time: 10/01/18  6:34 PM   Specimen: Nasopharyngeal Swab  Result Value Ref Range Status   SARS Coronavirus 2 NEGATIVE NEGATIVE Final    Comment: (NOTE) If result is NEGATIVE SARS-CoV-2 target nucleic acids are NOT DETECTED. The SARS-CoV-2 RNA is generally detectable in upper and lower  respiratory specimens during the acute phase of infection. The lowest  concentration of SARS-CoV-2 viral copies this assay can detect is 250  copies / mL. A negative result does not preclude SARS-CoV-2 infection  and should not be used as the sole basis for treatment or other  patient management decisions.  A negative result may occur with  improper specimen collection / handling, submission of specimen other  than nasopharyngeal swab, presence of viral mutation(s) within the  areas targeted by this assay, and inadequate number of viral copies  (<250 copies / mL). A negative result must be combined with clinical  observations, patient history, and epidemiological information. If result is POSITIVE SARS-CoV-2 target nucleic acids are DETECTED. The SARS-CoV-2 RNA is generally detectable in upper and lower  respiratory specimens dur ing the acute phase of infection.  Positive  results are indicative of active infection with SARS-CoV-2.  Clinical  correlation with  patient history and other diagnostic information is  necessary to determine patient infection status.  Positive results do  not rule out bacterial infection or co-infection with other viruses. If result is PRESUMPTIVE POSTIVE SARS-CoV-2 nucleic acids MAY BE PRESENT.   A presumptive positive result was obtained on the submitted specimen  and confirmed on repeat testing.  While 2019 novel coronavirus  (SARS-CoV-2) nucleic acids may be present in the submitted sample  additional confirmatory testing may be necessary for epidemiological  and / or clinical management purposes  to differentiate between  SARS-CoV-2 and other Sarbecovirus currently known to infect humans.  If clinically indicated additional testing with an alternate test  methodology [phone]) is advised. The SARS-CoV-2 RNA is generally  detectable in upper and lower respiratory sp ecimens during the acute  phase of infection. The expected result is Negative. Fact Sheet for Patients:  BoilerBrush.com.cyhttps://www.fda.gov/media/136312/download Fact Sheet for Healthcare Providers: https://pope.com/https://www.fda.gov/media/136313/download This test is not yet approved or cleared by the [place] FDA and has been authorized for detection and/or diagnosis of SARS-CoV-2 by FDA under an Emergency Use Authorization (EUA).  This EUA will remain in effect (meaning this test can be used) for the duration of the COVID-19 declaration under Section 564(b)(1) of the Act, 21 U.S.C. section 360bbb-3(b)(1), unless the authorization is terminated or revoked  sooner. Performed at [facility], [address] [place], [place] [address]   SARS Coronavirus 2 [facility](Hospital order, Performed in [facility] hospital lab) Nasopharyngeal     Status: None   Collection Time: 10/02/18  3:00 AM   Specimen: Nasopharyngeal  Result Value Ref Range Status   SARS Coronavirus 2 NEGATIVE NEGATIVE Final    Comment: (NOTE) If result is NEGATIVE SARS-CoV-2 target nucleic acids are NOT  DETECTED. The SARS-CoV-2 RNA is generally detectable in upper and lower  respiratory specimens during the acute phase of infection. The lowest  concentration of SARS-CoV-2 viral copies this assay can detect is 250  copies / mL. A negative result does not preclude SARS-CoV-2 infection  and should not be used as the sole basis for treatment or other  patient management decisions.  A negative result may occur with  improper specimen collection / handling, submission of specimen other  than nasopharyngeal swab, presence of viral mutation(s) within the  areas targeted by this assay, and inadequate number of viral copies  (<250 copies / mL). A negative result must be combined with clinical  observations, patient history, and epidemiological information. If result is POSITIVE SARS-CoV-2 target nucleic acids are DETECTED. The SARS-CoV-2 RNA is generally detectable in upper and lower  respiratory specimens dur ing the acute phase of infection.  Positive  results are indicative of active infection with SARS-CoV-2.  Clinical  correlation with patient history and other diagnostic information is  necessary to determine patient infection status.  Positive results do  not rule out bacterial infection or co-infection with other viruses. If result is PRESUMPTIVE POSTIVE SARS-CoV-2 nucleic acids MAY BE PRESENT.   A presumptive positive result was obtained on the submitted specimen  and confirmed on repeat testing.  While 2019 novel coronavirus  (SARS-CoV-2) nucleic acids may be present in the submitted sample  additional confirmatory testing may be necessary for epidemiological  and / or clinical management purposes  to differentiate between  SARS-CoV-2 and other Sarbecovirus currently known to infect humans.  If clinically indicated additional testing with an alternate test  methodology [phone]([id number]) is advised. The SARS-CoV-2 RNA is generally  detectable in upper and lower respiratory sp ecimens during  the acute  phase of infection. The expected result is Negative. Fact Sheet for Patients:  BoilerBrush.com.cyhttps://www.fda.gov/media/136312/download Fact Sheet for Healthcare Providers: https://pope.com/https://www.fda.gov/media/136313/download This test is not yet approved or cleared by the [place] FDA and has been authorized for detection and/or diagnosis of SARS-CoV-2 by FDA under an Emergency Use Authorization (EUA).  This EUA will remain in effect (meaning this test can be used) for the duration of the COVID-19 declaration under Section 564(b)(1) of the Act, 21 U.S.C. section 360bbb-3(b)(1), unless the authorization is terminated or revoked sooner. Performed at [facility], [address] [place], [place] [address]   MRSA PCR Screening     Status: None   Collection Time: 10/02/18  3:00 AM   Specimen: Nasopharyngeal  Result Value Ref Range Status   MRSA by PCR NEGATIVE NEGATIVE Final    Comment:        The GeneXpert MRSA Assay (FDA approved for NASAL specimens only), is one component of a comprehensive MRSA colonization surveillance program. It is not intended to diagnose MRSA infection nor to guide or monitor treatment for MRSA infections. Performed at [facility], [address] [place], [place] [address]   Culture, blood (Routine X 2) w Reflex to ID Panel     Status: None  Collection Time: 10/02/18  1:15 PM   Specimen: BLOOD LEFT HAND  Result Value Ref Range Status   Specimen Description BLOOD LEFT HAND  Final   Special Requests AEROBIC BOTTLE ONLY Blood Culture adequate volume  Final   Culture   Final    NO GROWTH 5 DAYS Performed at [facility], [place] [address] [place], [place] [address]    Report Status 10/07/2018 FINAL  Final  Culture, respiratory (non-expectorated)     Status: None   Collection Time: 10/05/18 10:35 AM   Specimen: Tracheal Aspirate; Respiratory  Result Value Ref Range Status   Specimen Description TRACHEAL ASPIRATE  Final   Special Requests  Normal  Final   Gram Stain   Final    RARE WBC PRESENT, PREDOMINANTLY MONONUCLEAR NO ORGANISMS SEEN    Culture   Final    Consistent with normal respiratory flora. Performed at [facility], [place] [address] [place], [place] [address]    Report Status 10/07/2018 FINAL  Final  Culture, blood (Routine X 2) w Reflex to ID Panel     Status: None   Collection Time: 10/05/18 10:41 AM   Specimen: BLOOD LEFT WRIST  Result Value Ref Range Status   Specimen Description BLOOD LEFT WRIST  Final   Special Requests   Final    BOTTLES DRAWN AEROBIC AND ANAEROBIC Blood Culture results may not be optimal due to an excessive volume of blood received in culture bottles   Culture   Final    NO GROWTH 5 DAYS Performed at [facility], [place] [address] [place], [place] [address]    Report Status 10/10/2018 FINAL  Final  Culture, blood (Routine X 2) w Reflex to ID Panel     Status: None   Collection Time: 10/05/18 10:52 AM   Specimen: BLOOD RIGHT HAND  Result Value Ref Range Status   Specimen Description BLOOD RIGHT HAND  Final   Special Requests   Final    BOTTLES DRAWN AEROBIC AND ANAEROBIC Blood Culture adequate volume   Culture   Final    NO GROWTH 5 DAYS Performed at [facility], [place] [address], [place], [place] [address]    Report Status 10/10/2018 FINAL  Final  Aerobic/Anaerobic Culture (surgical/deep wound)     Status: None (Preliminary result)   Collection Time: 10/07/18 10:21 AM   Specimen: Heart Valve; Tissue  Result Value Ref Range Status   Specimen Description WOUND HEART VALVE  Final   Special Requests SPEC A SWABS  Final   Gram Stain   Final    MODERATE WBC PRESENT,BOTH PMN AND MONONUCLEAR NO ORGANISMS SEEN    Culture   Final    NO GROWTH 3 DAYS NO ANAEROBES ISOLATED; CULTURE IN PROGRESS FOR 5 DAYS Performed at [facility], [place] [address] [place], [place] [address]    Report Status PENDING  Incomplete  Aerobic/Anaerobic Culture (surgical/deep wound)      Status: None (Preliminary result)   Collection Time: 10/07/18 10:21 AM   Specimen: Heart Valve Leaflet(s); Tissue  Result Value Ref Range Status   Specimen Description TISSUE AV LEAFLETS  Final   Special Requests SPEC B  Final   Gram Stain   Final    RARE WBC PRESENT, PREDOMINANTLY PMN NO ORGANISMS SEEN    Culture   Final    NO GROWTH 3 DAYS NO ANAEROBES ISOLATED; CULTURE IN PROGRESS FOR 5 DAYS Performed at [place]  [address] [place], [place] [address]    Report Status PENDING  Incomplete    Studies/Results: Dg Chest Port 1 View  Result Date: 10/09/2018 CLINICAL DATA:  58 year old female with history of aortic valve replacement on 10/07/2018. EXAM: PORTABLE CHEST 1 VIEW COMPARISON:  Chest x-ray 10/08/2018. FINDINGS: Previously noted endotracheal tube has been removed. 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 Previously noted Swan-Ganz catheter has been removed. Previously noted left-sided chest tube and mediastinal/pericardial drain have been removed. Lung volumes have increased. Decreasing bibasilar opacities most compatible with resolving areas of bibasilar postoperative subsegmental atelectasis. Trace bilateral pleural effusions, decreased compared to the prior examination. No evidence of pulmonary edema. Mild enlargement of the cardiopericardial silhouette, within normal limits in this postoperative patient. Upper mediastinal contours are within normal limits. Status post median sternotomy for aortic valve replacement (a stented bioprosthesis projects over the expected location of the aortic valve). IMPRESSION: 1. Postoperative changes and support apparatus, as above. 2. Improving lung volumes with resolving bibasilar subsegmental atelectasis and decreasing trace bilateral pleural effusions. Electronically Signed   By: Trudie Reedaniel  Entrikin M.D.   On: 10/09/2018 08:11      Assessment/Plan:  INTERVAL HISTORY:  Intubated   Patient status post aortic valve  replacement we had rifampin last Friday.  Cultures so far without growth creatinine has trended up  Principal Problem:   Staphylococcus aureus bacteremia with sepsis [facility]([name]) Active Problems:   Severe sepsis ([name])   Bacteremia due to methicillin susceptible Staphylococcus aureus (MSSA)   Aortic valve endocarditis   Acute respiratory failure with hypoxia ([name])   S/P AVR (aortic valve replacement)   Pressure injury of skin    Nelda SevereSheila D Murrell is a 58 y.o. 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  I am checking urine eosinophils and changing her to cefazolin  We will continue rifampin for now as well.  2.  Increase in serum creatinine: Likely multifactorial but we will switch from nafcillin to cefazolin for now           LOS: 9 days   Acey LavCornelius Van [place] 10/10/2018, 3:59 PM

## 2018-10-11 ENCOUNTER — Inpatient Hospital Stay (HOSPITAL_COMMUNITY): Payer: Medicaid Other

## 2018-10-11 LAB — GLUCOSE, CAPILLARY
Glucose-Capillary: 103 mg/dL — ABNORMAL HIGH (ref 70–99)
Glucose-Capillary: 107 mg/dL — ABNORMAL HIGH (ref 70–99)
Glucose-Capillary: 107 mg/dL — ABNORMAL HIGH (ref 70–99)
Glucose-Capillary: 111 mg/dL — ABNORMAL HIGH (ref 70–99)
Glucose-Capillary: 113 mg/dL — ABNORMAL HIGH (ref 70–99)
Glucose-Capillary: 89 mg/dL (ref 70–99)
Glucose-Capillary: 91 mg/dL (ref 70–99)

## 2018-10-11 LAB — BASIC METABOLIC PANEL
Anion gap: 13 (ref 5–15)
BUN: 41 mg/dL — ABNORMAL HIGH (ref 6–20)
CO2: 20 mmol/L — ABNORMAL LOW (ref 22–32)
Calcium: 8 mg/dL — ABNORMAL LOW (ref 8.9–10.3)
Chloride: 116 mmol/L — ABNORMAL HIGH (ref 98–111)
Creatinine, Ser: 2.17 mg/dL — ABNORMAL HIGH (ref 0.44–1.00)
GFR calc Af Amer: 28 mL/min — ABNORMAL LOW (ref >60)
GFR calc non Af Amer: 24 mL/min — ABNORMAL LOW (ref >60)
Glucose, Bld: 109 mg/dL — ABNORMAL HIGH (ref 70–99)
Potassium: 3.5 mmol/L (ref 3.5–5.1)
Sodium: 149 mmol/L — ABNORMAL HIGH (ref 135–145)

## 2018-10-11 LAB — CBC
HCT: 31 % — ABNORMAL LOW (ref 36.0–46.0)
Hemoglobin: 9.5 g/dL — ABNORMAL LOW (ref 12.0–15.0)
MCH: 28.1 pg (ref 26.0–34.0)
MCHC: 30.6 g/dL (ref 30.0–36.0)
MCV: 91.7 fL (ref 80.0–100.0)
Platelets: 117 10*3/uL — ABNORMAL LOW (ref 150–400)
RBC: 3.38 MIL/uL — ABNORMAL LOW (ref 3.87–5.11)
RDW: 17.2 % — ABNORMAL HIGH (ref 11.5–15.5)
WBC: 18.2 10*3/uL — ABNORMAL HIGH (ref 4.0–10.5)
nRBC: 0.2 % (ref 0.0–0.2)

## 2018-10-11 MED ORDER — LEVOTHYROXINE SODIUM 25 MCG PO TABS
125.0000 ug | ORAL_TABLET | Freq: Every day | ORAL | Status: DC
  Administered 2018-10-11 – 2018-11-02 (×23): 125 ug via ORAL
  Filled 2018-10-11 (×23): qty 1

## 2018-10-11 MED FILL — Heparin Sodium (Porcine) Inj 1000 Unit/ML: INTRAMUSCULAR | Qty: 10 | Status: AC

## 2018-10-11 MED FILL — Heparin Sodium (Porcine) Inj 1000 Unit/ML: INTRAMUSCULAR | Qty: 30 | Status: AC

## 2018-10-11 MED FILL — Potassium Chloride Inj 2 mEq/ML: INTRAVENOUS | Qty: 40 | Status: AC

## 2018-10-11 MED FILL — Mannitol IV Soln 20%: INTRAVENOUS | Qty: 500 | Status: AC

## 2018-10-11 MED FILL — Sodium Bicarbonate IV Soln 8.4%: INTRAVENOUS | Qty: 50 | Status: AC

## 2018-10-11 MED FILL — Heparin Sodium (Porcine) Inj 1000 Unit/ML: INTRAMUSCULAR | Qty: 2500 | Status: AC

## 2018-10-11 MED FILL — Electrolyte-R (PH 7.4) Solution: INTRAVENOUS | Qty: 4000 | Status: AC

## 2018-10-11 MED FILL — Lidocaine HCl Local Preservative Free (PF) Inj 2%: INTRAMUSCULAR | Qty: 15 | Status: AC

## 2018-10-11 MED FILL — Sodium Chloride IV Soln 0.9%: INTRAVENOUS | Qty: 4000 | Status: AC

## 2018-10-11 MED FILL — Calcium Chloride Inj 10%: INTRAVENOUS | Qty: 10 | Status: AC

## 2018-10-11 NOTE — Progress Notes (Signed)
      [address]       [place] [address]             [phone]                 4 Days Post-Op Procedure(s) (LRB): AORTIC VALVE REPLACEMENT (AVR) (N/A) TRANSESOPHAGEAL ECHOCARDIOGRAM (TEE) (N/A)   Events: Doing well.  Off supplemental O2 _______________________________________________________________ Vitals: BP 140/85   Pulse 95   Temp 99.2 F (37.3 C) (Oral)   Resp (!) 21   Ht 5\' 2"  (1.575 m)   Wt 94.9 kg   LMP 04/16/2013 (Approximate)   SpO2 93%   BMI 38.27 kg/m   - Neuro: alert NAD    - Cardiovascular: Sinus, regular.dressings clean  Drips: none    - Pulm: Coarse BS    - Abd: distended - Extremity: + edema  .Intake/Output      08/10 0701 - 08/11 0700 08/11 0701 - 08/12 0700   P.O. 120    I.V. (mL/kg) 234.6 (2.5)    Other 230    IV Piggyback 339.3    Total Intake(mL/kg) 923.8 (9.7)    Urine (mL/kg/hr) 1650 (0.7)    Emesis/NG output 200    Stool     Total Output 1850    Net -926.2         Urine Occurrence 1 x       _______________________________________________________________ Labs: CBC Latest Ref Rng & Units 10/11/2018 10/10/2018 10/09/2018  WBC 4.0 - 10.5 K/uL 18.2(H) 22.1(H) 17.7(H)  Hemoglobin 12.0 - 15.0 g/dL 9.5(L) 11.0(L) 9.6(L)  Hematocrit 36.0 - 46.0 % 31.0(L) 35.3(L) 30.9(L)  Platelets 150 - 400 K/uL 117(L) 104(L) 71(L)   CMP Latest Ref Rng & Units 10/11/2018 10/10/2018 10/09/2018  Glucose 70 - 99 mg/dL 109(H) 184(H) 104(H)  BUN 6 - 20 mg/dL 41(H) 40(H) 35(H)  Creatinine 0.44 - 1.00 mg/dL 2.17(H) 2.19(H) 1.92(H)  Sodium 135 - 145 mmol/L 149(H) 149(H) 153(H)  Potassium 3.5 - 5.1 mmol/L 3.5 3.8 3.5  Chloride 98 - 111 mmol/L 116(H) 119(H) 118(H)  CO2 22 - 32 mmol/L 20(L) 19(L) 23  Calcium 8.9 - 10.3 mg/dL 8.0(L) 7.5(L) 7.8(L)  Total Protein 6.5 - 8.1 g/dL - - -  Total Bilirubin 0.3 - 1.2 mg/dL - - -  Alkaline Phos 38 - 126 U/L - - -  AST 15 - 41 U/L - - -  ALT 0 - 44 U/L - - -    CXR:    _______________________________________________________________  Assessment and Plan: POD 4 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  Hx of developmental delays  Neuro: stable.  Nonfocal.  Pain control CV: off all drips.  Sinus rhythm.   Pulm:  Aggressive pulm toilet Renal: creat stable.  Good uop.  Nafcillin switched to ancef GI: on diet Heme: stable ID: MSSA endocarditis, on ancef and rifampin.  WBC trending down Endo: transition to SSI Dispo: floor today  Melodie Bouillon, MD 10/11/2018 8:16 AM

## 2018-10-11 NOTE — Progress Notes (Addendum)
Subjective:  no complaints   Antibiotics:  Anti-infectives (From admission, onward)   Start     Dose/Rate Route Frequency Ordered Stop   10/10/18 1300  ceFAZolin (ANCEF) IVPB 2g/100 mL premix     2 g 200 mL/hr over 30 Minutes Intravenous Every 12 hours 10/10/18 1147     10/08/18 1400  rifampin (RIFADIN) 60 mg/mL oral suspension 300 mg     300 mg Oral Every 8 hours 10/08/18 0839     10/07/18 2200  rifampin (RIFADIN) 60 mg/mL oral suspension 300 mg  Status:  Discontinued     300 mg Per Tube Every 8 hours 10/07/18 1714 10/08/18 0839   10/07/18 2000  vancomycin (VANCOCIN) IVPB 1000 mg/200 mL premix     1,000 mg 200 mL/hr over 60 Minutes Intravenous  Once 10/07/18 1336 10/07/18 2158   10/07/18 0430  cefUROXime (ZINACEF) 750 mg in sodium chloride 0.9 % 100 mL IVPB  Status:  Discontinued     750 mg 200 mL/hr over 30 Minutes Intravenous To Surgery 10/06/18 1531 10/07/18 1248   10/07/18 0400  vancomycin (VANCOCIN) 1,000 mg in sodium chloride 0.9 % 1,000 mL irrigation      Irrigation To Surgery 10/06/18 1415 10/07/18 0932   10/07/18 0400  vancomycin (VANCOCIN) 1,250 mg in sodium chloride 0.9 % 250 mL IVPB     1,250 mg 166.7 mL/hr over 90 Minutes Intravenous To Surgery 10/06/18 1415 10/07/18 0800   10/07/18 0400  cefUROXime (ZINACEF) 1.5 g in sodium chloride 0.9 % 100 mL IVPB     1.5 g 200 mL/hr over 30 Minutes Intravenous To Surgery 10/06/18 1415 10/07/18 1129   10/06/18 1430  cefUROXime (ZINACEF) 750 mg in sodium chloride 0.9 % 100 mL IVPB  Status:  Discontinued     750 mg 200 mL/hr over 30 Minutes Intravenous To Surgery 10/06/18 1415 10/06/18 1532   10/05/18 1300  nafcillin 2 g in sodium chloride 0.9 % 100 mL IVPB  Status:  Discontinued     2 g 200 mL/hr over 30 Minutes Intravenous Every 4 hours 10/05/18 1109 10/10/18 1147   10/02/18 1200  ceFAZolin (ANCEF) IVPB 2g/100 mL premix  Status:  Discontinued     2 g 200 mL/hr over 30 Minutes Intravenous Every 8 hours 10/02/18 1040  10/05/18 1109   10/01/18 2000  cefTRIAXone (ROCEPHIN) 2 g in sodium chloride 0.9 % 100 mL IVPB  Status:  Discontinued     2 g 200 mL/hr over 30 Minutes Intravenous Every 24 hours 10/01/18 1958 10/02/18 1040   10/01/18 2000  azithromycin (ZITHROMAX) 500 mg in sodium chloride 0.9 % 250 mL IVPB  Status:  Discontinued     500 mg 250 mL/hr over 60 Minutes Intravenous Every 24 hours 10/01/18 1958 10/02/18 1040      Medications: Scheduled Meds: . sodium chloride   Intravenous Once  . acetaminophen  1,000 mg Oral Q6H   Or  . acetaminophen (TYLENOL) oral liquid 160 mg/5 mL  1,000 mg Per Tube Q6H  . aspirin EC  325 mg Oral Daily   Or  . aspirin  324 mg Per Tube Daily  . bisacodyl  10 mg Oral Daily   Or  . bisacodyl  10 mg Rectal Daily  . calcium-vitamin D  1 tablet Oral Q breakfast  . Chlorhexidine Gluconate Cloth  6 each Topical Daily  . docusate sodium  200 mg Oral Daily  . enoxaparin (LOVENOX) injection  30 mg Subcutaneous QHS  .  feeding supplement (ENSURE ENLIVE)  237 mL Oral TID BM  . FLUoxetine  10 mg Oral QHS  . insulin aspart  0-24 Units Subcutaneous Q4H  . levothyroxine  125 mcg Oral Q0600  . mouth rinse  15 mL Mouth Rinse BID  . metoprolol tartrate  12.5 mg Oral BID  . montelukast  10 mg Oral QHS  . multivitamin with minerals  1 tablet Oral Daily  . mupirocin cream   Topical Daily  . pantoprazole  40 mg Oral Daily  . rifampin  300 mg Oral Q8H  . sodium chloride flush  10-40 mL Intracatheter Q12H  . sodium chloride flush  3 mL Intravenous Q12H   Continuous Infusions: . sodium chloride 10 mL/hr at 10/11/18 0700  .  ceFAZolin (ANCEF) IV Stopped (10/10/18 2230)  . lactated ringers Stopped (10/08/18 0841)  . lactated ringers Stopped (10/09/18 0919)  . lactated ringers 20 mL/hr at 10/09/18 1000   PRN Meds:.sodium chloride, metoprolol tartrate, morphine injection, ondansetron (ZOFRAN) IV, oxyCODONE, sodium chloride flush, sodium chloride flush, traMADol    Objective:  Weight change: 3 kg  Intake/Output Summary (Last 24 hours) at 10/11/2018 0928 Last data filed at 10/11/2018 0800 Gross per 24 hour  Intake 932.06 ml  Output 2150 ml  Net -1217.94 ml   Blood pressure (!) 141/83, pulse 93, temperature 99.2 F (37.3 C), temperature source Oral, resp. rate (!) 24, height 5\' 2"  (1.575 m), weight 94.9 kg, last menstrual period 04/16/2013, SpO2 95 %. Temp:  [97.8 F (36.6 C)-99.2 F (37.3 C)] 99.2 F (37.3 C) (08/11 0747) Pulse Rate:  [84-112] 93 (08/11 0800) Resp:  [17-30] 24 (08/11 0800) BP: (106-141)/(68-86) 141/83 (08/11 0800) SpO2:  [93 %-100 %] 95 % (08/11 0800) Weight:  [94.9 kg] 94.9 kg (08/11 0500)  Physical Exam: General: Alert and awake , oriented person,  HEENT: anicteric sclera, EOMI CVS tachy , sternotomy clean no murmur heard Chest: ,fairly clear Abdomen: soft non-distended,  Skin: no rashes Neuro: nonfocal  CBC:    BMET Recent Labs    10/10/18 0232 10/11/18 0323  NA 149* 149*  K 3.8 3.5  CL 119* 116*  CO2 19* 20*  GLUCOSE 184* 109*  BUN 40* 41*  CREATININE 2.19* 2.17*  CALCIUM 7.5* 8.0*     Liver Panel  No results for input(s): PROT, ALBUMIN, AST, ALT, ALKPHOS, BILITOT, BILIDIR, IBILI in the last 72 hours.     Sedimentation Rate No results for input(s): ESRSEDRATE in the last 72 hours. C-Reactive Protein No results for input(s): CRP in the last 72 hours.  Micro Results: Recent Results (from the past 720 hour(s))  Blood Culture (routine x 2)     Status: Abnormal   Collection Time: 10/01/18  6:20 PM   Specimen: Blood  Result Value Ref Range Status   Specimen Description   Final    BLOOD RIGHT ANTECUBITAL Performed at [facility], [address], [place], [place] [address]    Special Requests   Final    BOTTLES DRAWN AEROBIC AND ANAEROBIC Blood Culture adequate volume Performed at [facility], [address] [place], [place] [address]    Culture  Setup Time   Final    GRAM POSITIVE COCCI  Gram Stain Report Called to,Read Back By and Verified With: PERRY,C. AT 0516 ON 10/02/2018 BY EVA ANAEROBIC BOTTLE ONLY Performed at [facility] GRAM POSITIVE COCCI RESULT PREVIOUSLY CALLED  AERO BOTTLE Performed at [facility], [address], [place], [place] [address]  Culture (A)  Final    STAPHYLOCOCCUS AUREUS SUSCEPTIBILITIES PERFORMED ON PREVIOUS CULTURE WITHIN THE LAST 5 DAYS. Performed at [facility], [address], [place], [place] [address]    Report Status 10/04/2018 FINAL  Final  Blood Culture (routine x 2)     Status: Abnormal   Collection Time: 10/01/18  6:28 PM   Specimen: Blood  Result Value Ref Range Status   Specimen Description BLOOD LEFT ANTECUBITAL  Final   Special Requests   Final    BOTTLES DRAWN AEROBIC AND ANAEROBIC Blood Culture adequate volume   Culture  Setup Time   Final    GRAM POSITIVE COCCI Gram Stain Report Called to,Read Back By and Verified With: PERRY,C. AT 0516 ON 10/02/2018 BY EVA AEROBIC BOTTLE ONLY Performed at [facility] CRITICAL RESULT CALLED TO, READ BACK BY AND VERIFIED WITH: Hollie Beach [id number] MLM Performed at [facility], [address], [place], [place] [address]    Culture STAPHYLOCOCCUS AUREUS (A)  Final   Report Status 10/04/2018 FINAL  Final   Organism ID, Bacteria STAPHYLOCOCCUS AUREUS  Final      Susceptibility   Staphylococcus aureus - MIC*    CIPROFLOXACIN <=0.5 SENSITIVE Sensitive     ERYTHROMYCIN >=8 RESISTANT Resistant     GENTAMICIN <=0.5 SENSITIVE Sensitive     OXACILLIN <=0.25 SENSITIVE Sensitive     TETRACYCLINE <=1 SENSITIVE Sensitive     VANCOMYCIN <=0.5 SENSITIVE Sensitive     TRIMETH/SULFA <=10 SENSITIVE Sensitive     CLINDAMYCIN RESISTANT Resistant     RIFAMPIN <=0.5 SENSITIVE Sensitive     Inducible Clindamycin POSITIVE Resistant     * STAPHYLOCOCCUS AUREUS  Blood Culture ID Panel (Reflexed)     Status: Abnormal   Collection Time: 10/01/18  6:28 PM  Result Value  Ref Range Status   Enterococcus species NOT DETECTED NOT DETECTED Final   Listeria monocytogenes NOT DETECTED NOT DETECTED Final   Staphylococcus species DETECTED (A) NOT DETECTED Final    Comment: CRITICAL RESULT CALLED TO, READ BACK BY AND VERIFIED WITH: PHARMD M MACCIA [id number] MLM    Staphylococcus aureus (BCID) DETECTED (A) NOT DETECTED Final    Comment: Methicillin (oxacillin) susceptible Staphylococcus aureus (MSSA). Preferred therapy is anti staphylococcal beta lactam antibiotic (Cefazolin or Nafcillin), unless clinically contraindicated. CRITICAL RESULT CALLED TO, READ BACK BY AND VERIFIED WITH: PHARMD M MACCIA [id number] 1034 MLM    Methicillin resistance NOT DETECTED NOT DETECTED Final   Streptococcus species NOT DETECTED NOT DETECTED Final   Streptococcus agalactiae NOT DETECTED NOT DETECTED Final   Streptococcus pneumoniae NOT DETECTED NOT DETECTED Final   Streptococcus pyogenes NOT DETECTED NOT DETECTED Final   Acinetobacter baumannii NOT DETECTED NOT DETECTED Final   Enterobacteriaceae species NOT DETECTED NOT DETECTED Final   Enterobacter cloacae complex NOT DETECTED NOT DETECTED Final   Escherichia coli NOT DETECTED NOT DETECTED Final   Klebsiella oxytoca NOT DETECTED NOT DETECTED Final   Klebsiella pneumoniae NOT DETECTED NOT DETECTED Final   Proteus species NOT DETECTED NOT DETECTED Final   Serratia marcescens NOT DETECTED NOT DETECTED Final   Haemophilus influenzae NOT DETECTED NOT DETECTED Final   Neisseria meningitidis NOT DETECTED NOT DETECTED Final   Pseudomonas aeruginosa NOT DETECTED NOT DETECTED Final   Candida albicans NOT DETECTED NOT DETECTED Final   Candida glabrata NOT DETECTED NOT DETECTED Final   Candida krusei NOT DETECTED NOT DETECTED Final   Candida parapsilosis NOT DETECTED NOT DETECTED Final  Candida tropicalis NOT DETECTED NOT DETECTED Final    Comment: Performed at [facility], [place] [address] [place], [place] [address]  Urine culture      Status: None   Collection Time: 10/01/18  6:34 PM   Specimen: In/Out Cath Urine  Result Value Ref Range Status   Specimen Description   Final    IN/OUT CATH URINE Performed at [facility], [address], [place], [place] [address]    Special Requests   Final    NONE Performed at [facility], [address], [place], [place] [address]    Culture   Final    NO GROWTH Performed at [facility], [place] [address], [place], [place] [address]    Report Status 10/03/2018 FINAL  Final  SARS Coronavirus 2 [facility] order, Performed in [facility] hospital lab) Nasopharyngeal Nasopharyngeal Swab     Status: None   Collection Time: 10/01/18  6:34 PM   Specimen: Nasopharyngeal Swab  Result Value Ref Range Status   SARS Coronavirus 2 NEGATIVE NEGATIVE Final    Comment: (NOTE) If result is NEGATIVE SARS-CoV-2 target nucleic acids are NOT DETECTED. The SARS-CoV-2 RNA is generally detectable in upper and lower  respiratory specimens during the acute phase of infection. The lowest  concentration of SARS-CoV-2 viral copies this assay can detect is 250  copies / mL. A negative result does not preclude SARS-CoV-2 infection  and should not be used as the sole basis for treatment or other  patient management decisions.  A negative result may occur with  improper specimen collection / handling, submission of specimen other  than nasopharyngeal swab, presence of viral mutation(s) within the  areas targeted by this assay, and inadequate number of viral copies  (<250 copies / mL). A negative result must be combined with clinical  observations, patient history, and epidemiological information. If result is POSITIVE SARS-CoV-2 target nucleic acids are DETECTED. The SARS-CoV-2 RNA is generally detectable in upper and lower  respiratory specimens dur ing the acute phase of infection.  Positive  results are indicative of active infection with SARS-CoV-2.  Clinical  correlation with patient history  and other diagnostic information is  necessary to determine patient infection status.  Positive results do  not rule out bacterial infection or co-infection with other viruses. If result is PRESUMPTIVE POSTIVE SARS-CoV-2 nucleic acids MAY BE PRESENT.   A presumptive positive result was obtained on the submitted specimen  and confirmed on repeat testing.  While 2019 novel coronavirus  (SARS-CoV-2) nucleic acids may be present in the submitted sample  additional confirmatory testing may be necessary for epidemiological  and / or clinical management purposes  to differentiate between  SARS-CoV-2 and other Sarbecovirus currently known to infect humans.  If clinically indicated additional testing with an alternate test  methodology [phone]) is advised. The SARS-CoV-2 RNA is generally  detectable in upper and lower respiratory sp ecimens during the acute  phase of infection. The expected result is Negative. Fact Sheet for Patients:  StrictlyIdeas.no Fact Sheet for Healthcare Providers: BankingDealers.co.za This test is not yet approved or cleared by the [place] FDA and has been authorized for detection and/or diagnosis of SARS-CoV-2 by FDA under an Emergency Use Authorization (EUA).  This EUA will remain in effect (meaning this test can be used) for the duration of the COVID-19 declaration under Section 564(b)(1) of the Act, 21 U.S.C. section 360bbb-3(b)(1), unless the authorization is terminated or revoked sooner. Performed at [facility], [phone]  [address], [place], [place] [address]   SARS Coronavirus 2 [facility] order, Performed in [facility] hospital lab) Nasopharyngeal     Status: None   Collection Time: 10/02/18  3:00 AM   Specimen: Nasopharyngeal  Result Value Ref Range Status   SARS Coronavirus 2 NEGATIVE NEGATIVE Final    Comment: (NOTE) If result is NEGATIVE SARS-CoV-2 target nucleic acids are NOT DETECTED. The SARS-CoV-2  RNA is generally detectable in upper and lower  respiratory specimens during the acute phase of infection. The lowest  concentration of SARS-CoV-2 viral copies this assay can detect is 250  copies / mL. A negative result does not preclude SARS-CoV-2 infection  and should not be used as the sole basis for treatment or other  patient management decisions.  A negative result may occur with  improper specimen collection / handling, submission of specimen other  than nasopharyngeal swab, presence of viral mutation(s) within the  areas targeted by this assay, and inadequate number of viral copies  (<250 copies / mL). A negative result must be combined with clinical  observations, patient history, and epidemiological information. If result is POSITIVE SARS-CoV-2 target nucleic acids are DETECTED. The SARS-CoV-2 RNA is generally detectable in upper and lower  respiratory specimens dur ing the acute phase of infection.  Positive  results are indicative of active infection with SARS-CoV-2.  Clinical  correlation with patient history and other diagnostic information is  necessary to determine patient infection status.  Positive results do  not rule out bacterial infection or co-infection with other viruses. If result is PRESUMPTIVE POSTIVE SARS-CoV-2 nucleic acids MAY BE PRESENT.   A presumptive positive result was obtained on the submitted specimen  and confirmed on repeat testing.  While 2019 novel coronavirus  (SARS-CoV-2) nucleic acids may be present in the submitted sample  additional confirmatory testing may be necessary for epidemiological  and / or clinical management purposes  to differentiate between  SARS-CoV-2 and other Sarbecovirus currently known to infect humans.  If clinically indicated additional testing with an alternate test  methodology [phone]) is advised. The SARS-CoV-2 RNA is generally  detectable in upper and lower respiratory sp ecimens during the acute  phase of  infection. The expected result is Negative. Fact Sheet for Patients:  BoilerBrush.com.cy Fact Sheet for Healthcare Providers: https://pope.com/ This test is not yet approved or cleared by the [place] FDA and has been authorized for detection and/or diagnosis of SARS-CoV-2 by FDA under an Emergency Use Authorization (EUA).  This EUA will remain in effect (meaning this test can be used) for the duration of the COVID-19 declaration under Section 564(b)(1) of the Act, 21 U.S.C. section 360bbb-3(b)(1), unless the authorization is terminated or revoked sooner. Performed at [facility], [address], [place], [place] [address]   MRSA PCR Screening     Status: None   Collection Time: 10/02/18  3:00 AM   Specimen: Nasopharyngeal  Result Value Ref Range Status   MRSA by PCR NEGATIVE NEGATIVE Final    Comment:        The GeneXpert MRSA Assay (FDA approved for NASAL specimens only), is one component of a comprehensive MRSA colonization surveillance program. It is not intended to diagnose MRSA infection nor to guide or monitor treatment for MRSA infections. Performed at [facility], [address] [place], [place] [address]   Culture, blood (Routine X 2) w Reflex to ID Panel     Status: None   Collection Time: 10/02/18  1:15 PM  Specimen: BLOOD LEFT HAND  Result Value Ref Range Status   Specimen Description BLOOD LEFT HAND  Final   Special Requests AEROBIC BOTTLE ONLY Blood Culture adequate volume  Final   Culture   Final    NO GROWTH 5 DAYS Performed at [facility], [address] [place], [place] [address]    Report Status 10/07/2018 FINAL  Final  Culture, respiratory (non-expectorated)     Status: None   Collection Time: 10/05/18 10:35 AM   Specimen: Tracheal Aspirate; Respiratory  Result Value Ref Range Status   Specimen Description TRACHEAL ASPIRATE  Final   Special Requests Normal  Final   Gram  Stain   Final    RARE WBC PRESENT, PREDOMINANTLY MONONUCLEAR NO ORGANISMS SEEN    Culture   Final    Consistent with normal respiratory flora. Performed at [facility], [address] [place], [place] [address]    Report Status 10/07/2018 FINAL  Final  Culture, blood (Routine X 2) w Reflex to ID Panel     Status: None   Collection Time: 10/05/18 10:41 AM   Specimen: BLOOD LEFT WRIST  Result Value Ref Range Status   Specimen Description BLOOD LEFT WRIST  Final   Special Requests   Final    BOTTLES DRAWN AEROBIC AND ANAEROBIC Blood Culture results may not be optimal due to an excessive volume of blood received in culture bottles   Culture   Final    NO GROWTH 5 DAYS Performed at [facility], [address] [place], [place] [address]    Report Status 10/10/2018 FINAL  Final  Culture, blood (Routine X 2) w Reflex to ID Panel     Status: None   Collection Time: 10/05/18 10:52 AM   Specimen: BLOOD RIGHT HAND  Result Value Ref Range Status   Specimen Description BLOOD RIGHT HAND  Final   Special Requests   Final    BOTTLES DRAWN AEROBIC AND ANAEROBIC Blood Culture adequate volume   Culture   Final    NO GROWTH 5 DAYS Performed at [facility], [address], [place], [place] [address]    Report Status 10/10/2018 FINAL  Final  Aerobic/Anaerobic Culture (surgical/deep wound)     Status: None (Preliminary result)   Collection Time: 10/07/18 10:21 AM   Specimen: Heart Valve; Tissue  Result Value Ref Range Status   Specimen Description WOUND HEART VALVE  Final   Special Requests SPEC A SWABS  Final   Gram Stain   Final    MODERATE WBC PRESENT,BOTH PMN AND MONONUCLEAR NO ORGANISMS SEEN    Culture   Final    NO GROWTH 3 DAYS NO ANAEROBES ISOLATED; CULTURE IN PROGRESS FOR 5 DAYS Performed at [facility], [address] [place], [place] [address]    Report Status PENDING  Incomplete  Aerobic/Anaerobic Culture (surgical/deep wound)     Status: None  (Preliminary result)   Collection Time: 10/07/18 10:21 AM   Specimen: Heart Valve Leaflet(s); Tissue  Result Value Ref Range Status   Specimen Description TISSUE AV LEAFLETS  Final   Special Requests SPEC B  Final   Gram Stain   Final    RARE WBC PRESENT, PREDOMINANTLY PMN NO ORGANISMS SEEN    Culture   Final    NO GROWTH 3 DAYS NO ANAEROBES ISOLATED; CULTURE IN PROGRESS FOR 5 DAYS Performed at [facility], [address] [place], [place] [address]  Report Status PENDING  Incomplete    Studies/Results: Dg Chest Port 1 View  Result Date: 10/11/2018 CLINICAL DATA:  AVR.  Sore chest. EXAM: PORTABLE CHEST 1 VIEW COMPARISON:  10/09/2018. FINDINGS: Interim removal of right IJ sheath. Subcutaneous emphysema noted over the right neck. Prior AVR. Stable cardiomegaly. Mild pulmonary venous congestion bilateral interstitial prominence. Small bilateral pleural effusions. Findings suggest mild CHF. Low lung volumes with bibasilar atelectasis no pneumothorax. IMPRESSION: 1. Interim removal of right IJ sheath. Right neck subcutaneous emphysema noted. No pneumothorax noted. 2. Prior AVR. Stable cardiomegaly. Mild pulmonary venous congestion and bilateral interstitial prominence. Small bilateral pleural effusions. Findings suggest CHF. 3.  Low lung volumes with mild bibasilar atelectasis. Electronically Signed   By: Maisie Fushomas  Register   On: 10/11/2018 08:14      Assessment/Plan:  INTERVAL HISTORY:  Intubated   Cr stable  Principal Problem:   Staphylococcus aureus bacteremia with sepsis ([name]) Active Problems:   Severe sepsis ([name])   Bacteremia due to methicillin susceptible Staphylococcus aureus (MSSA)   Aortic valve endocarditis   Acute respiratory failure with hypoxia ([name])   S/P AVR (aortic valve replacement)   Pressure injury of skin    Audrey Collier is a 58 y.o. 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  I ordered  urine eosinophils and changed her to cefazolin  We will continue rifampin for now as well though ddi may be an issue with anticoagulation   I would like her to get 6 weeks of postoperative beta lactam  I have arranged for HSFU in our clinic in 4 weeks  Endocarditis Evaluation   Infective endocarditis (IE) is associated with a high rate of complications and mortality.  Specific aspects of clinical management are critical to optimizing the outcome of patients with IE. Therefore, the Pharmacy Residency Program at [facility] has initiated an evidence-based consult aimed at improving the management of IE at [facility].     Comments  Consult to ID Outpatient follow-up appointment to be made prior to discharge. DONE  Utilization of ID recommended antibiotic therapy   Surgery Evaluation Cardiothoracic surgery consult if the following indications are present:  - Heart failure associated to valve dysfunction - Endocarditis complicated by heart block or aortic abscess - Left-sided endocarditis caused by S. aureus, fungal, or multi-drug resistant organisms - Left-sided endocarditis with severe valve dysfunction - Large vegetation (>1 cm) on aortic or mitral valve - Persistence of infection 5-7 days after initiation of appropriate antibiotic therapy - Recurrent emboli and persistent vegetations despite appropriate antibiotic therapy - Any history with left-sided embolization with persistent vegetations on TEE  SP CT SURGERY  Consult electrophysiologist if presence of implanted  cardiac device (pacemaker, ICD)   Consult to cardiology if left ventricular ejection fraction is <40% Evaluation for appropriate heart failure medications upon discharge: - ACE/ARB - Diuretic - Beta blocker  Outpatient follow-up appointment to be made prior to discharge.   Based on the ID MD's evaluation of this patient, the following are recommended to complete a thorough evaluation and care plan to reflect guideline-recommended therapy.  2.  Increase in serum creatinine: Likely multifactorial stable at present   Acute issues  remain stable we will followup her intraop cultures but sign off but happy to come back and see her during her hospital stay or consider +/- rifampin being on board as mentioned above   Audrey Collier has an appointment on 11/09/2018 at 1030 am with Dr. Daiva EvesVan Dam  She should arrive 15 minutes early for appt.  [facility] at  [address] [place].  [address], which is located to the left of the elevators.  Phone: [phone]  Fax: [phone]  [email or web address]       LOS: 10 days   Acey LavCornelius Van Dam 10/11/2018, 9:28 AM

## 2018-10-11 NOTE — Progress Notes (Signed)
Patient arrived the unit on hospital bed from [place], placed on tele ccmd notified, patient oriented to room and staff, bed in lowest position, call bel within reach will continue to monitor.

## 2018-10-11 NOTE — Progress Notes (Signed)
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 Tommy Medal on 11/09/18

## 2018-10-11 NOTE — Progress Notes (Signed)
PHARMACY CONSULT NOTE FOR:  OUTPATIENT  PARENTERAL ANTIBIOTIC THERAPY (OPAT)  Indication: MSSA Endocarditis  Regimen: Cefazolin 2 gm every 8 hours  End date: 11/17/2018  IV antibiotic discharge orders are pended. To discharging provider:  please sign these orders via discharge navigator,  Select New Orders & click on the button choice - Manage This Unsigned Work.     Thank you for allowing pharmacy to be a part of this patient's care.  Jimmy Footman, PharmD, BCPS, [place] [facility] Clinical Pharmacist Phone: [phone] 10/11/2018, 2:34 PM

## 2018-10-12 ENCOUNTER — Inpatient Hospital Stay (HOSPITAL_COMMUNITY): Payer: Medicaid Other

## 2018-10-12 LAB — GLUCOSE, CAPILLARY
Glucose-Capillary: 95 mg/dL (ref 70–99)
Glucose-Capillary: 121 mg/dL — ABNORMAL HIGH (ref 70–99)
Glucose-Capillary: 163 mg/dL — ABNORMAL HIGH (ref 70–99)

## 2018-10-12 LAB — BASIC METABOLIC PANEL
Anion gap: 12 (ref 5–15)
BUN: 35 mg/dL — ABNORMAL HIGH (ref 6–20)
CO2: 21 mmol/L — ABNORMAL LOW (ref 22–32)
Calcium: 8.1 mg/dL — ABNORMAL LOW (ref 8.9–10.3)
Chloride: 116 mmol/L — ABNORMAL HIGH (ref 98–111)
Creatinine, Ser: 1.81 mg/dL — ABNORMAL HIGH (ref 0.44–1.00)
GFR calc Af Amer: 35 mL/min — ABNORMAL LOW (ref >60)
GFR calc non Af Amer: 30 mL/min — ABNORMAL LOW (ref >60)
Glucose, Bld: 106 mg/dL — ABNORMAL HIGH (ref 70–99)
Potassium: 3.7 mmol/L (ref 3.5–5.1)
Sodium: 149 mmol/L — ABNORMAL HIGH (ref 135–145)

## 2018-10-12 LAB — AEROBIC/ANAEROBIC CULTURE W GRAM STAIN (SURGICAL/DEEP WOUND)
Culture: NO GROWTH
Culture: NO GROWTH

## 2018-10-12 MED ORDER — METOPROLOL TARTRATE 25 MG PO TABS
25.0000 mg | ORAL_TABLET | Freq: Two times a day (BID) | ORAL | Status: DC
  Administered 2018-10-12 – 2018-11-05 (×48): 25 mg via ORAL
  Filled 2018-10-12 (×49): qty 1

## 2018-10-12 MED ORDER — CEFAZOLIN SODIUM-DEXTROSE 2-4 GM/100ML-% IV SOLN
2.0000 g | Freq: Three times a day (TID) | INTRAVENOUS | Status: DC
  Administered 2018-10-12 – 2018-11-07 (×79): 2 g via INTRAVENOUS
  Filled 2018-10-12 (×82): qty 100

## 2018-10-12 MED ORDER — POTASSIUM CHLORIDE CRYS ER 20 MEQ PO TBCR
20.0000 meq | EXTENDED_RELEASE_TABLET | Freq: Once | ORAL | Status: AC
  Administered 2018-10-12: 09:00:00 20 meq via ORAL
  Filled 2018-10-12: qty 1

## 2018-10-12 MED ORDER — ENOXAPARIN SODIUM 40 MG/0.4ML ~~LOC~~ SOLN
40.0000 mg | Freq: Every day | SUBCUTANEOUS | Status: DC
  Administered 2018-10-12 – 2018-11-01 (×21): 40 mg via SUBCUTANEOUS
  Filled 2018-10-12 (×21): qty 0.4

## 2018-10-12 NOTE — Progress Notes (Signed)
PHARMACY NOTE:  ANTIMICROBIAL RENAL DOSAGE ADJUSTMENT  Current antimicrobial regimen includes a mismatch between antimicrobial dosage and estimated renal function.  As per policy approved by the Pharmacy & Therapeutics and Medical Executive Committees, the antimicrobial dosage will be adjusted accordingly.  Current antimicrobial dosage:  Ancef 2gm IV q12h  Indication: MSSA Endocarditis  Renal Function:  Estimated Creatinine Clearance: 36.8 mL/min (A) (by C-G formula based on SCr of 1.81 mg/dL (H)).  Antimicrobial dosage has been changed to:  Ancef 2gm IV q8h  Additional comments: Discharge orders already written for ancef 2gm IV q8h   Thank you for allowing pharmacy to be a part of this patient's care.  Hildred Laser, PharmD Clinical Pharmacist **Pharmacist phone directory can now be found on [email or web address] ([email or web address]).  Listed under [place].

## 2018-10-12 NOTE — Progress Notes (Addendum)
      [address]       [place] [address]             [phone]        5 Days Post-Op Procedure(s) (LRB): AORTIC VALVE REPLACEMENT (AVR) (N/A) TRANSESOPHAGEAL ECHOCARDIOGRAM (TEE) (N/A)  Subjective: Patient has no complaints this am.  Objective: Vital signs in last 24 hours: Temp:  [97.7 F (36.5 C)-99.2 F (37.3 C)] 98.5 F (36.9 C) (08/12 0003) Pulse Rate:  [82-99] 89 (08/12 0003) Cardiac Rhythm: Normal sinus rhythm (08/11 2150) Resp:  [18-35] 26 (08/12 0003) BP: (113-152)/(70-104) 146/71 (08/12 0003) SpO2:  [94 %-97 %] 96 % (08/12 0003)  Pre op weight 83.9 kg Current Weight  10/11/18 94.9 kg       Intake/Output from previous day: 08/11 0701 - 08/12 0700 In: 273.1 [P.O.:150; I.V.:23.1; IV Piggyback:100] Out: 300 [Urine:300]   Physical Exam:  Cardiovascular: RRR Pulmonary: Diminished bibasilar breath sounds Abdomen: Soft, non tender, bowel sounds present. Extremities: ++ feet edema. SCDS in place on LEs Wound: Clean and dry.  No erythema or signs of infection.  Lab Results: CBC: Recent Labs    10/10/18 0232 10/11/18 0323  WBC 22.1* 18.2*  HGB 11.0* 9.5*  HCT 35.3* 31.0*  PLT 104* 117*   BMET:  Recent Labs    10/10/18 0232 10/11/18 0323  NA 149* 149*  K 3.8 3.5  CL 119* 116*  CO2 19* 20*  GLUCOSE 184* 109*  BUN 40* 41*  CREATININE 2.19* 2.17*  CALCIUM 7.5* 8.0*    PT/INR:  Lab Results  Component Value Date   INR 1.7 (H) 10/07/2018   INR 1.4 (H) 10/01/2018   ABG:  INR: Will add last result for INR, ABG once components are confirmed Will add last 4 CBG results once components are confirmed  Assessment/Plan:  1. CV - SR in the 90's and hypertensive (SBP 140's) this am. On Lopressor 12.5 mg bid so will increase. No ACE/ARB secondary to elevated creatinine. 2.  Pulmonary - On room air. Will order PA/LAT CXR. Encourage incentive spirometer 3. AKI- creatinine this am 2.17. Creatinine upon admission 1.7. Is volume  overloaded but will discuss with Dr. Kipp Brood 4.  Expected acute blood loss anemia - H and H this am 9.5 and 31. 5. Mild thrombocytopenia-platelets 117,000 6. Gently supplement potassium 7. ID-WBC decreased to 18,200. On Cefazolin 2 g bid and Rifampin 300 mg tid for MSSA. Per infectious disease, will need a total of 6 weeks. 8. Hypothyroidism-on Levothyroxine 125 mcg daily 9. CBGs 107/107/95. No prior history of DM. Stop accu checks and SS PRN.  Donielle M ZimmermanPA-C 10/12/2018,7:15 AM [phone]  Agree with above Creat trending down.   On ancef and rifampin, will need 6 week course. dispo planning.

## 2018-10-12 NOTE — Progress Notes (Signed)
Physical Therapy Treatment Patient Details Name: Audrey SevereSheila D Atkin MRN: [id number] DOB: Apr 21, 1960 Today's Date: 10/12/2018    History of Present Illness 58 yo female presented with fever, vomiting, weakness.  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  Echo showed vegetation.  Transferred to ICU 8/04 due to respiratory failure.  Underwent AVR with TEE intraoprative on 10/07/18. Extubated 10/08/18.    PT Comments    Pt admitted with above diagnosis. Pt currently with functional limitations due to balance and endurance deficits. Pt was able to stand to [facility] needing +2 max assist but needed less assist than last visit overall.  Pt able to stand x 3 with assist.  Pt still needs incr cues and assist however did improve today.  Pt will benefit from skilled PT to increase their independence and safety with mobility to allow discharge to the venue listed below.     Follow Up Recommendations  CIR     Equipment Recommendations  Other (comment)(TBA)    Recommendations for Other Services Rehab consult     Precautions / Restrictions Precautions Precautions: Fall;Sternal Precaution Comments: verbally reviewed with patient during functional tasks  Restrictions Weight Bearing Restrictions: (sternal precautions) Other Position/Activity Restrictions: sternal precautions     Mobility  Bed Mobility Overal bed mobility: Needs Assistance Bed Mobility: Supine to Sit     Supine to sit: HOB elevated;Max assist;+2 for physical assistance     General bed mobility comments: assist to guide legs to EOB and assist to lift trunk uprighjt, scooting to EOB with assist using pads under pt.  Pt with posterior lean. Pt did however initiate movement a little more today.   Transfers Overall transfer level: Needs assistance   Transfers: Sit to/from Stand Sit to Stand: Max assist;From elevated surface(+3 to raise buttocks from EOB)         General transfer comment: [facility] max  assist of 2 and use of pad with 3rd person assisting with anterior lean with gait belt so that PT/OT could get buttocks pads under pt. Pt able to stand a lot better giving more effort today but has difficulty sustaining stand for longer than 10 seconds.   Pt was  wet with urine.  Moved pt to the [place]iner in [place] then stood  to be cleaned standing up to 15seconds with this incr assist and cues to stand tall and upright as she tends to lean to her left and forward flexed.  Stood extra attempt to place pillow under pt using pad again to assist to power up. Positioned in chair comfortably at end of treatment.    Ambulation/Gait             General Gait Details: unable   Stairs             Wheelchair Mobility    Modified Rankin (Stroke Patients Only)       Balance Overall balance assessment: Needs assistance Sitting-balance support: Bilateral upper extremity supported;Feet supported;No upper extremity supported Sitting balance-Leahy Scale: Poor Sitting balance - Comments: min to max A at least for sitting EOB due to leaning posterior and to left heavily at times.  Pt lacks balance reactions in sitting needing cues and assist for support.  Postural control: Posterior lean;Left lateral lean Standing balance support: Bilateral upper extremity supported;During functional activity Standing balance-Leahy Scale: Zero Standing balance comment: unable to achieve full upright standing even with +3 assist.  Cognition Arousal/Alertness: Awake/alert(Simultaneous filing. User may not have seen previous data.) Behavior During Therapy: Flat affect(Simultaneous filing. User may not have seen previous data.) Overall Cognitive Status: No family/caregiver present to determine baseline cognitive functioning(Simultaneous filing. User may not have seen previous data.)                                 General Comments: hx of cognitive deficits per  chart, patient oriented to self and place (requires options for time), she is able to follow commands inconsistently; highly fearful during session(Simultaneous filing. User may not have seen previous data.)      Exercises General Exercises - Lower Extremity Ankle Circles/Pumps: AROM;Both;10 reps;Seated Long Arc Quad: AROM;Both;10 reps;Seated    General Comments General comments (skin integrity, edema, etc.): VSS      Pertinent Vitals/Pain Pain Assessment: Faces Faces Pain Scale: Hurts even more(Simultaneous filing. User may not have seen previous data.) Pain Location: generalized, with grimacing, but denies and does not localize(Simultaneous filing. User may not have seen previous data.) Pain Descriptors / Indicators: Grimacing(Simultaneous filing. User may not have seen previous data.) Pain Intervention(s): Limited activity within patient's tolerance;Monitored during session;Repositioned(Simultaneous filing. User may not have seen previous data.)    Home Living                      Prior Function            PT Goals (current goals can now be found in the care plan section) Acute Rehab PT Goals Patient Stated Goal: agreeable to get stronger Progress towards PT goals: Progressing toward goals    Frequency    Min 3X/week      PT Plan Current plan remains appropriate    Co-evaluation PT/OT/SLP Co-Evaluation/Treatment: Yes Reason for Co-Treatment: Complexity of the patient's impairments (multi-system involvement);For patient/therapist safety PT goals addressed during session: Mobility/safety with mobility OT goals addressed during session: ADL's and self-care      AM-PAC PT "6 Clicks" Mobility   Outcome Measure  Help needed turning from your back to your side while in a flat bed without using bedrails?: A Lot Help needed moving from lying on your back to sitting on the side of a flat bed without using bedrails?: A Lot Help needed moving to and from a bed to  a chair (including a wheelchair)?: Total Help needed standing up from a chair using your arms (e.g., wheelchair or bedside chair)?: A Lot Help needed to walk in hospital room?: Total Help needed climbing 3-5 steps with a railing? : Total 6 Click Score: 9    End of Session Equipment Utilized During Treatment: Gait belt Activity Tolerance: Patient limited by fatigue Patient left: in chair;with call bell/phone within reach;with chair alarm set Nurse Communication: Mobility status;Need for lift equipment PT Visit Diagnosis: Other abnormalities of gait and mobility (R26.89);Muscle weakness (generalized) (M62.81);Unsteadiness on feet (R26.81)     Time: 0630-1601 PT Time Calculation (min) (ACUTE ONLY): 23 min  Charges:  $Therapeutic Activity: 8-22 mins                     [place] Pager:  [phone]  Office:  [place] 10/12/2018, 1:56 PM

## 2018-10-12 NOTE — Progress Notes (Signed)
Occupational Therapy Treatment Patient Details Name: Audrey Collier MRN: [id number] DOB: 04-24-1960 Today's Date: 10/12/2018    History of present illness 58 yo female presented with fever, vomiting, weakness.  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  Echo showed vegetation.  Transferred to ICU 8/04 due to respiratory failure.  Underwent AVR with TEE intraoprative on 10/07/18. Extubated 10/08/18.   OT comments  Patient progressing slowly.  Patient demonstrates improved initiation of tasks today, but continues to require increased time and effort with processing and motor planning.  Requires max assist +2 to transition to EOB with posterior lean, utilizing stedy for transfers as pt unable to ascend fully into stand with max assist +2.  Total assist for hygiene.  Pt fatigues easily.  VSS during session.  Reviewed UE exercises and elevation due to edema.  Will follow acutely.    Follow Up Recommendations  CIR;Supervision/Assistance - 24 hour    Equipment Recommendations  3 in 1 bedside commode    Recommendations for Other Services Rehab consult    Precautions / Restrictions Precautions Precautions: Fall;Sternal Precaution Comments: verbally reviewed with patient during functional tasks  Restrictions Weight Bearing Restrictions: (sternal precautions) Other Position/Activity Restrictions: sternal precautions        Mobility Bed Mobility Overal bed mobility: Needs Assistance Bed Mobility: Supine to Sit     Supine to sit: HOB elevated;Max assist;+2 for physical assistance     General bed mobility comments: assist to guide legs to EOB and assist to lift trunk uprighjt, scooting to EOB with assist using pads under pt.  Pt with posterior lean. Pt did however initiate movement a little more today.   Transfers Overall transfer level: Needs assistance   Transfers: Sit to/from Stand Sit to Stand: Max assist;From elevated surface(+3 to raise buttocks from EOB)         General  transfer comment: Pt was able to stand to [facility] needing max assist of 2 and use of pad with 3rd person assisting with anterior lean with gait belt so that PT/OT could get buttocks pads under pt. Pt able to stand a lot better giving more effort today but has difficulty sustaining stand for longer than 10 seconds.   Pt was  wet with urine.  Moved pt to the recliner in [place] then stood  to be cleaned standing up to 15seconds with this incr assist and cues to stand tall and upright as she tends to lean to her left and forward flexed.  Stood extra attempt to place pillow under pt using pad again to assist to power up. Positioned in chair comfortably at end of treatment.      Balance Overall balance assessment: Needs assistance Sitting-balance support: Bilateral upper extremity supported;Feet supported;No upper extremity supported Sitting balance-Leahy Scale: Poor Sitting balance - Comments: min to max A at least for sitting EOB due to leaning posterior and to left heavily at times.  Pt lacks balance reactions in sitting needing cues and assist for support.  Postural control: Posterior lean;Left lateral lean Standing balance support: Bilateral upper extremity supported;During functional activity Standing balance-Leahy Scale: Zero Standing balance comment: unable to achieve full upright standing even with +3 assist.                            ADL either performed or assessed with clinical judgement   ADL Overall ADL's : Needs assistance/impaired     Grooming: Minimal assistance;Wash/dry hands;Wash/dry face;Sitting   Upper Body Bathing:  Moderate assistance;Sitting Upper Body Bathing Details (indicate cue type and reason): hand over hand assist intitation to wash arms with increased time          Lower Body Dressing: Total assistance;+2 for physical assistance;+2 for safety/equipment;Sit to/from stand   Toilet Transfer: Maximal assistance;+2 for physical assistance;+2 for  safety/equipment Toilet Transfer Details (indicate cue type and reason): using stedy to recliner  Toileting- Clothing Manipulation and Hygiene: Total assistance;+2 for physical assistance Toileting - Clothing Manipulation Details (indicate cue type and reason): soiled upon entry, total assist for hygiene      Functional mobility during ADLs: Maximal assistance;+2 for physical assistance;+2 for safety/equipment       Vision       Perception     Praxis      Cognition Arousal/Alertness: Awake/alert Behavior During Therapy: Flat affect Overall Cognitive Status: No family/caregiver present to determine baseline cognitive functioning                                 General Comments: hx of cognitive deficits per chart, patient oriented to self and place (requires options for time), she is able to follow commands inconsistently; highly fearful during session        Exercises Exercises: General Upper Extremity General Exercises - Upper Extremity Elbow Flexion: AROM;10 reps;Both;Seated Elbow Extension: AROM;Both;10 reps;Seated Digit Composite Flexion: AROM;Both;10 reps;Seated Composite Extension: AROM;10 reps;Both;Seated General Exercises - Lower Extremity Ankle Circles/Pumps: AROM;Both;10 reps;Seated Long Arc Quad: AROM;Both;10 reps;Seated   Shoulder Instructions       General Comments VSS    Pertinent Vitals/ Pain       Pain Assessment: Faces Faces Pain Scale: Hurts even more Pain Location: generalized, with grimacing, but denies and does not localize Pain Descriptors / Indicators: Grimacing Pain Intervention(s): Limited activity within patient's tolerance;Monitored during session;Repositioned(Simultaneous filing. User may not have seen previous data.)  Home Living                                          Prior Functioning/Environment              Frequency  Min 2X/week        Progress Toward Goals  OT Goals(current goals  can now be found in the care plan section)  Progress towards OT goals: Progressing toward goals  Acute Rehab OT Goals Patient Stated Goal: agreeable to get stronger OT Goal Formulation: With patient  Plan Discharge plan remains appropriate;Frequency remains appropriate    Co-evaluation    PT/OT/SLP Co-Evaluation/Treatment: Yes Reason for Co-Treatment: Complexity of the patient's impairments (multi-system involvement);For patient/therapist safety PT goals addressed during session: Mobility/safety with mobility OT goals addressed during session: ADL's and self-care      AM-PAC OT "6 Clicks" Daily Activity     Outcome Measure   Help from another person eating meals?: A Little Help from another person taking care of personal grooming?: A Little Help from another person toileting, which includes using toliet, bedpan, or urinal?: Total Help from another person bathing (including washing, rinsing, drying)?: A Lot Help from another person to put on and taking off regular upper body clothing?: A Lot Help from another person to put on and taking off regular lower body clothing?: Total 6 Click Score: 12    End of Session Equipment Utilized During Treatment: Gait belt;Other (comment)(stedy)  OT  Visit Diagnosis: Other abnormalities of gait and mobility (R26.89);Muscle weakness (generalized) (M62.81);Pain Pain - part of body: (generalized)   Activity Tolerance Patient tolerated treatment well   Patient Left in chair;with call bell/phone within reach;with chair alarm set   Nurse Communication Mobility status;Need for lift equipment;Precautions        Time: 1204-1228 OT Time Calculation (min): 24 min  Charges: OT General Charges $OT Visit: 1 Visit OT Treatments $Self Care/Home Management : 8-22 mins  Chancy Milroyhristie S Denarius Sesler, OT Acute Rehabilitation Services Pager [phone] Office [phone]    Chancy MilroyChristie S Josimar Corning 10/12/2018, 2:54 PM

## 2018-10-13 LAB — CBC
HCT: 27.9 % — ABNORMAL LOW (ref 36.0–46.0)
Hemoglobin: 8.5 g/dL — ABNORMAL LOW (ref 12.0–15.0)
MCH: 28.3 pg (ref 26.0–34.0)
MCHC: 30.5 g/dL (ref 30.0–36.0)
MCV: 93 fL (ref 80.0–100.0)
Platelets: 136 10*3/uL — ABNORMAL LOW (ref 150–400)
RBC: 3 MIL/uL — ABNORMAL LOW (ref 3.87–5.11)
RDW: 17.2 % — ABNORMAL HIGH (ref 11.5–15.5)
WBC: 13.4 10*3/uL — ABNORMAL HIGH (ref 4.0–10.5)
nRBC: 0.1 % (ref 0.0–0.2)

## 2018-10-13 LAB — BASIC METABOLIC PANEL
Anion gap: 13 (ref 5–15)
BUN: 35 mg/dL — ABNORMAL HIGH (ref 6–20)
CO2: 19 mmol/L — ABNORMAL LOW (ref 22–32)
Calcium: 8 mg/dL — ABNORMAL LOW (ref 8.9–10.3)
Chloride: 115 mmol/L — ABNORMAL HIGH (ref 98–111)
Creatinine, Ser: 1.8 mg/dL — ABNORMAL HIGH (ref 0.44–1.00)
GFR calc Af Amer: 36 mL/min — ABNORMAL LOW (ref >60)
GFR calc non Af Amer: 31 mL/min — ABNORMAL LOW (ref >60)
Glucose, Bld: 108 mg/dL — ABNORMAL HIGH (ref 70–99)
Potassium: 4.3 mmol/L (ref 3.5–5.1)
Sodium: 147 mmol/L — ABNORMAL HIGH (ref 135–145)

## 2018-10-13 MED ORDER — FERROUS SULFATE 325 (65 FE) MG PO TABS
325.0000 mg | ORAL_TABLET | Freq: Every day | ORAL | Status: DC
  Administered 2018-10-13 – 2018-10-30 (×18): 325 mg via ORAL
  Filled 2018-10-13 (×17): qty 1

## 2018-10-13 MED ORDER — FUROSEMIDE 40 MG PO TABS
40.0000 mg | ORAL_TABLET | Freq: Every day | ORAL | Status: DC
  Administered 2018-10-13 – 2018-10-17 (×5): 40 mg via ORAL
  Filled 2018-10-13 (×7): qty 1

## 2018-10-13 NOTE — Progress Notes (Signed)
Inpatient Rehab Admissions:  Inpatient Rehab Consult received.  I met with patient and her daughter in law at the bedside for rehabilitation assessment and to discuss goals and expectations of an inpatient rehab admission.  Pt currently lives with her aunt (also disabled) in the basement of her son/daughter in [place] home.  Pt typically spends the day at the [facility] and is mod I at home.  Discussed need for 24/7 assist at discharge (given pt's current functional level of max +3) and need to demonstrate tolerance for 3 hours of therapy/day.  Will follow up on Monday to determine whether pt can tolerate and whether 24/7 assist can be arranged by family.   Signed: Shann Medal, PT, DPT Admissions Coordinator [phone] 10/13/18  1:15 PM

## 2018-10-13 NOTE — Progress Notes (Signed)
CARDIAC REHAB PHASE I   Went to help pt get OOB with use of [facility]. Pt found with vomit on her gown and c/o of her belly not feeling good. When changing pts gown, pt found to be wet, and have a large unformed stool. Pt bathed, tele leads changed, incision cleaned, linens changed. Noted red splotchy rash on pts back and legs. Pt very edematous. RN replaced pts purewick. Practiced IS and Flutter with pt. Call bell within reach. Will continue to follow as able.  [phone] Rufina Falco, RN BSN 10/13/2018 11:03 AM

## 2018-10-13 NOTE — Progress Notes (Signed)
Checked with pt yesterday. Noted from PT that she has been unable to ambulate. Had pt practice flutter valve and IS however she was not able to perform IS correctly (always blowing instead of inspiring). Will f/u at a distance for education with family and progression of ambulation. Yves Dill CES, ACSM 7:09 AM 10/13/2018

## 2018-10-13 NOTE — Progress Notes (Addendum)
Progress Note  Patient Name: Audrey Collier Date of Encounter: 10/13/2018  Primary Cardiologist: Fransico Him, MD, but would like to follow in [place]  Subjective   Due to critical illness, was unable to obtain angiography or CT coronary. Pt denies past chest pain. She lives in [place] and would like to follow with Dr. Harl Bowie (brother/family follows with him)  Pt is nauseous and vomited this morning. Some LE edema.   Inpatient Medications    Scheduled Meds: . sodium chloride   Intravenous Once  . aspirin EC  325 mg Oral Daily   Or  . aspirin  324 mg Per Tube Daily  . bisacodyl  10 mg Oral Daily   Or  . bisacodyl  10 mg Rectal Daily  . calcium-vitamin D  1 tablet Oral Q breakfast  . Chlorhexidine Gluconate Cloth  6 each Topical Daily  . docusate sodium  200 mg Oral Daily  . enoxaparin (LOVENOX) injection  40 mg Subcutaneous QHS  . feeding supplement (ENSURE ENLIVE)  237 mL Oral TID BM  . ferrous sulfate  325 mg Oral Q breakfast  . FLUoxetine  10 mg Oral QHS  . furosemide  40 mg Oral Daily  . levothyroxine  125 mcg Oral Q0600  . mouth rinse  15 mL Mouth Rinse BID  . metoprolol tartrate  25 mg Oral BID  . montelukast  10 mg Oral QHS  . multivitamin with minerals  1 tablet Oral Daily  . mupirocin cream   Topical Daily  . pantoprazole  40 mg Oral Daily  . rifampin  300 mg Oral Q8H  . sodium chloride flush  10-40 mL Intracatheter Q12H  . sodium chloride flush  3 mL Intravenous Q12H   Continuous Infusions: . sodium chloride Stopped (10/11/18 1207)  .  ceFAZolin (ANCEF) IV 2 g (10/13/18 0432)  . lactated ringers Stopped (10/08/18 0841)  . lactated ringers Stopped (10/09/18 0919)  . lactated ringers 20 mL/hr at 10/09/18 1000   PRN Meds: sodium chloride, metoprolol tartrate, morphine injection, ondansetron (ZOFRAN) IV, oxyCODONE, sodium chloride flush, sodium chloride flush, traMADol   Vital Signs    Vitals:   10/12/18 2007 10/13/18 0017 10/13/18 0410  10/13/18 0855  BP: 111/82 112/68 126/89 138/77  Pulse: 95 80 88 89  Resp: (!) 23 (!) 23 (!) 22 (!) 24  Temp: 98.3 F (36.8 C) 98.1 F (36.7 C) 98.6 F (37 C) 98 F (36.7 C)  TempSrc: Oral Oral Oral Oral  SpO2: 95% 94% 94% 97%  Weight:   94.1 kg   Height:        Intake/Output Summary (Last 24 hours) at 10/13/2018 1117 Last data filed at 10/13/2018 0500 Gross per 24 hour  Intake 920 ml  Output 950 ml  Net -30 ml   Last 3 Weights 10/13/2018 10/11/2018 10/10/2018  Weight (lbs) 207 lb 7.3 oz 209 lb 3.5 oz 202 lb 9.6 oz  Weight (kg) 94.1 kg 94.9 kg 91.9 kg      Telemetry    Sinus rhythm - Personally Reviewed  ECG    No new tracings - Personally Reviewed  Physical Exam   GEN: No acute distress, somnolent.   Neck: No JVD Cardiac: RRR, no murmurs, sternotomy C/D/I Respiratory: Clear to auscultation bilaterally. GI: Soft, nontender, non-distended  MS: B LE 1+ edema Neuro:  Nonfocal  Psych: Normal affect   Labs    High Sensitivity Troponin:  No results for input(s): TROPONINIHS in the last 720 hours.  Cardiac EnzymesNo results for input(s): TROPONINI in the last 168 hours. No results for input(s): TROPIPOC in the last 168 hours.   Chemistry Recent Labs  Lab 10/11/18 0323 10/12/18 0921 10/13/18 0432  NA 149* 149* 147*  K 3.5 3.7 4.3  CL 116* 116* 115*  CO2 20* 21* 19*  GLUCOSE 109* 106* 108*  BUN 41* 35* 35*  CREATININE 2.17* 1.81* 1.80*  CALCIUM 8.0* 8.1* 8.0*  GFRNONAA 24* 30* 31*  GFRAA 28* 35* 36*  ANIONGAP 13 12 13      Hematology Recent Labs  Lab 10/10/18 0232 10/11/18 0323 10/13/18 0432  WBC 22.1* 18.2* 13.4*  RBC 3.82* 3.38* 3.00*  HGB 11.0* 9.5* 8.5*  HCT 35.3* 31.0* 27.9*  MCV 92.4 91.7 93.0  MCH 28.8 28.1 28.3  MCHC 31.2 30.6 30.5  RDW 17.3* 17.2* 17.2*  PLT 104* 117* 136*    BNPNo results for input(s): BNP, PROBNP in the last 168 hours.   DDimer No results for input(s): DDIMER in the last 168 hours.   Radiology    Dg Chest 2  View  Result Date: 10/12/2018 CLINICAL DATA:  Recent aortic valve replacement EXAM: CHEST - 2 VIEW COMPARISON:  10/11/2018 FINDINGS: Changes consistent with prior AVR. Cardiac shadow remains enlarged. The lungs are well aerated with mild bibasilar atelectasis. No focal infiltrate is seen. Small effusions are noted bilaterally as well. IMPRESSION: Small effusions and mild bibasilar atelectasis. Electronically Signed   By: Alcide CleverMark  Lukens M.D.   On: 10/12/2018 12:25    Cardiac Studies   Echo 10/02/18: 1. The left ventricle has normal systolic function, with an ejection fraction of 55-60%. The cavity size was normal. There is mildly increased left ventricular wall thickness. Left ventricular diastolic Doppler parameters are indeterminate. Elevated  mean left atrial pressure.  2. The right ventricle has normal systolic function. The cavity was normal. There is no increase in right ventricular wall thickness.  3. Mild thickening of the mitral valve leaflet. Mild calcification of the mitral valve leaflet. There is mild mitral annular calcification present. No evidence of mitral valve stenosis.  4. The aortic valve is tricuspid. Mild thickening of the aortic valve. Mild calcification of the aortic valve. No stenosis of the aortic valve. Mild aortic annular calcification noted.  5. The RV inflow view there is a suggestion of a small density attached to the TV. Limited visualization.  6. There is a small semi mobile echogenic density attached to the aortic valve, appears to be attached to the noncoronary cusp.  7. The aorta is normal in size and structure.  8. The aortic root is normal in size and structure.  9. Pulmonary hypertension is borderline elevated, PASP is 34 mmHg. 10. The interatrial septum was not well visualized. 11. Clear evidence of a small density attached to the aortic valve, in setting of bacteremia this likely represents a vegetation. Possible small density attached to tricuspid valve.  Consider TEE to better evaluate.  Patient Profile     58 y.o. 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 Recommendations for medications and follow up:  Discharge Medications: [facility] medications [facility] Exceptions to the above:  Lasix taper on discharge with normal LVEF  Follow Up: The patient's Primary Cardiologist is Armanda Magicraci Turner, MD - but will follow with Dr. Wyline MoodBranch I have messaged office for appt.  Signed,  Roe Rutherfordngela Nicole Duke, [place]  11:23 AM 10/13/2018  [facility]     [facility] will sign off.    For questions or updates, please contact [facility] Please consult [email or web address] for contact info under      Signed, Marcelino Dusterngela Nicole Duke, PA  10/13/2018, 11:17 AM     Patient seen and examined. Agree with assessment and plan. Day 6 s/p AVR for endocarditis. On metoprolol. Cr slighly improving and down to 1.81 today. No ACE-I/ARB at present. Plan 6 weeks of antibiotics.  [place] in [place] can be followed by Dr. Wyline MoodBranch post DC who also sees a family member.    Lennette Biharihomas A. Kynlie Jane, MD, [facility] Hospital[facility]ACC 10/13/2018 2:02 PM

## 2018-10-13 NOTE — Progress Notes (Addendum)
      [address]       [place] [address]             [phone]        6 Days Post-Op Procedure(s) (LRB): AORTIC VALVE REPLACEMENT (AVR) (N/A) TRANSESOPHAGEAL ECHOCARDIOGRAM (TEE) (N/A)  Subjective: Patient asleep and just waking up.  Objective: Vital signs in last 24 hours: Temp:  [98.1 F (36.7 C)-98.6 F (37 C)] 98.6 F (37 C) (08/13 0410) Pulse Rate:  [80-98] 88 (08/13 0410) Cardiac Rhythm: Normal sinus rhythm (08/13 0410) Resp:  [16-25] 22 (08/13 0410) BP: (111-146)/(68-89) 126/89 (08/13 0410) SpO2:  [94 %-95 %] 94 % (08/13 0410) Weight:  [94.1 kg] 94.1 kg (08/13 0410)  Pre op weight 83.9 kg Current Weight  10/13/18 94.1 kg       Intake/Output from previous day: 08/12 0701 - 08/13 0700 In: 920 [P.O.:720; IV Piggyback:200] Out: 950 [Urine:950]   Physical Exam:  Cardiovascular: RRR Pulmonary: Slightly diminished bibasilar breath sounds Abdomen: Soft, non tender, bowel sounds present. Extremities: +feet edema. SCDS in place on LEs Wound: Clean and dry.  No erythema or signs of infection.  Lab Results: CBC: Recent Labs    10/11/18 0323 10/13/18 0432  WBC 18.2* 13.4*  HGB 9.5* 8.5*  HCT 31.0* 27.9*  PLT 117* 136*   BMET:  Recent Labs    10/12/18 0921 10/13/18 0432  NA 149* 147*  K 3.7 4.3  CL 116* 115*  CO2 21* 19*  GLUCOSE 106* 108*  BUN 35* 35*  CREATININE 1.81* 1.80*  CALCIUM 8.1* 8.0*    PT/INR:  Lab Results  Component Value Date   INR 1.7 (H) 10/07/2018   INR 1.4 (H) 10/01/2018   ABG:  INR: Will add last result for INR, ABG once components are confirmed Will add last 4 CBG results once components are confirmed  Assessment/Plan:  1. CV - SR in the 90's and hypertensive (SBP 140's) this am. On Lopressor 25 mg bid so will increase. No ACE/ARB secondary to elevated creatinine. 2.  Pulmonary - On room air.  CXR done yesterday showed small pleural effusions and bibasilar atelectasis. Encourage incentive  spirometer 3. AKI- creatinine this am 1.8. Creatinine upon admission 1.7. She is volume overloaded and as discussed with Dr. Kipp Brood, will start Lasix 40 mg daily.  4. Expected acute blood loss anemia - H and H this am decreased to 8.5 and 27.9. Start oral ferrous 5. Mild thrombocytopenia-platelets increased to 136,000 6. ID-WBC decreased to 13,400. On Cefazolin 2 g bid and Rifampin 300 mg tid for MSSA. Per infectious disease, will need a total of 6 weeks (last dose 09/17) 7. Hypothyroidism-on Levothyroxine 125 mcg daily 8. PT/OT recommendations are to CIR. Will consult CIR  Donielle M ZimmermanPA-C 10/13/2018,7:13 AM [phone]

## 2018-10-14 LAB — BASIC METABOLIC PANEL
Anion gap: 9 (ref 5–15)
BUN: 33 mg/dL — ABNORMAL HIGH (ref 6–20)
CO2: 21 mmol/L — ABNORMAL LOW (ref 22–32)
Calcium: 7.9 mg/dL — ABNORMAL LOW (ref 8.9–10.3)
Chloride: 115 mmol/L — ABNORMAL HIGH (ref 98–111)
Creatinine, Ser: 1.79 mg/dL — ABNORMAL HIGH (ref 0.44–1.00)
GFR calc Af Amer: 36 mL/min — ABNORMAL LOW (ref >60)
GFR calc non Af Amer: 31 mL/min — ABNORMAL LOW (ref >60)
Glucose, Bld: 133 mg/dL — ABNORMAL HIGH (ref 70–99)
Potassium: 3.6 mmol/L (ref 3.5–5.1)
Sodium: 145 mmol/L (ref 135–145)

## 2018-10-14 MED ORDER — METOCLOPRAMIDE HCL 5 MG/ML IJ SOLN
10.0000 mg | Freq: Four times a day (QID) | INTRAMUSCULAR | Status: AC
  Administered 2018-10-14 (×3): 10 mg via INTRAVENOUS
  Filled 2018-10-14 (×3): qty 2

## 2018-10-14 MED ORDER — RIFAMPIN 300 MG PO CAPS
300.0000 mg | ORAL_CAPSULE | Freq: Three times a day (TID) | ORAL | Status: DC
  Administered 2018-10-14 – 2018-11-06 (×66): 300 mg via ORAL
  Filled 2018-10-14 (×76): qty 1

## 2018-10-14 MED ORDER — ENSURE ENLIVE PO LIQD
237.0000 mL | Freq: Two times a day (BID) | ORAL | Status: DC
  Administered 2018-10-16 – 2018-10-18 (×2): 237 mL via ORAL

## 2018-10-14 MED ORDER — POTASSIUM CHLORIDE CRYS ER 20 MEQ PO TBCR
20.0000 meq | EXTENDED_RELEASE_TABLET | Freq: Once | ORAL | Status: AC
  Administered 2018-10-14: 10:00:00 20 meq via ORAL
  Filled 2018-10-14: qty 1

## 2018-10-14 NOTE — Progress Notes (Addendum)
      [address]       [place], [address]             [phone]        7 Days Post-Op Procedure(s) (LRB): AORTIC VALVE REPLACEMENT (AVR) (N/A) TRANSESOPHAGEAL ECHOCARDIOGRAM (TEE) (N/A)  Subjective: Nurse reports patient had fever to 100, nausea and vomiting last evening. She did have a bowel movement. She is just waking up this am.  Objective: Vital signs in last 24 hours: Temp:  [98 F (36.7 C)-100.1 F (37.8 C)] 98.7 F (37.1 C) (08/14 0618) Pulse Rate:  [88-98] 96 (08/14 0618) Cardiac Rhythm: Normal sinus rhythm (08/14 0423) Resp:  [0-28] 28 (08/14 0618) BP: (93-138)/(68-77) 93/76 (08/14 0440) SpO2:  [86 %-97 %] 97 % (08/14 0618) Weight:  [94.3 kg] 94.3 kg (08/14 0440)  Pre op weight 83.9 kg Current Weight  10/14/18 94.3 kg      Intake/Output from previous day: 08/13 0701 - 08/14 0700 In: 1263.2 [P.O.:950; IV Piggyback:313.2] Out: 800 [Urine:800]   Physical Exam:  Cardiovascular: RRR Pulmonary: Slightly diminished bibasilar breath sounds Abdomen: Soft, non tender, bowel sounds present. Extremities: +feet edema. SCDS in place on LEs Wound: Clean and dry.  No erythema or signs of infection.  Lab Results: CBC: Recent Labs    10/13/18 0432  WBC 13.4*  HGB 8.5*  HCT 27.9*  PLT 136*   BMET:  Recent Labs    10/13/18 0432 10/14/18 0340  NA 147* 145  K 4.3 3.6  CL 115* 115*  CO2 19* 21*  GLUCOSE 108* 133*  BUN 35* 33*  CREATININE 1.80* 1.79*  CALCIUM 8.0* 7.9*    PT/INR:  Lab Results  Component Value Date   INR 1.7 (H) 10/07/2018   INR 1.4 (H) 10/01/2018   ABG:  INR: Will add last result for INR, ABG once components are confirmed Will add last 4 CBG results once components are confirmed  Assessment/Plan:  1. CV - SR in the 90's. On Lopressor 25 mg bid. No ACE/ARB secondary to elevated creatinine. 2.  Pulmonary - On 2 liters this am. Nurse reports she did desat last evening.   Encourage incentive spirometer 3.  AKI- creatinine this am 1.79. Creatinine upon admission 1.7. She is volume overloaded so will continue Lasix 40 mg daily. 4. Expected acute blood loss anemia - Last H and H decreased to 8.5 and 27.9. Start oral ferrous 5. Mild thrombocytopenia-last platelets increased to 136,000 6. ID-Had fever to 100 last evening per nurse. Last WBC decreased to 13,400. On Cefazolin 2 g bid and Rifampin 300 mg tid for MSSA. Per infectious disease, will need a total of 6 weeks (last dose 09/17) 7. Hypothyroidism-on Levothyroxine 125 mcg daily 8. Supplement potassium 9. GI-previous N/V. Will give Reglan. If has symptoms today, will make NPO and get KUB. 10. PT/OT recommendations are to CIR. CIR vs home with disabled [place] 10/14/2018,7:09 AM [phone]

## 2018-10-14 NOTE — Progress Notes (Signed)
Physical Therapy Treatment Patient Details Name: Audrey Collier MRN: [id number] DOB: Apr 13, 1960 Today's Date: 10/14/2018    History of Present Illness 58 yo female presented with fever, vomiting, weakness.  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  Echo showed vegetation.  Transferred to ICU 8/04 due to respiratory failure.  Underwent AVR with TEE intraoprative on 10/07/18. Extubated 10/08/18.    PT Comments    Patient progressing, but slowly and feel it may take longer to rehab her to get her back to group home than a short CIR stay.  Feel she may be more appropriate for SNF level rehab.  PT to follow acutely.    Follow Up Recommendations  SNF     Equipment Recommendations  Other (comment)(TBA)    Recommendations for Other Services       Precautions / Restrictions Precautions Precautions: Fall;Sternal    Mobility  Bed Mobility Overal bed mobility: Needs Assistance Bed Mobility: Supine to Sit     Supine to sit: HOB elevated;Mod assist;+2 for safety/equipment     General bed mobility comments: initiates with moving legs to EOB, assist of two to lift trunk  Transfers Overall transfer level: Needs assistance   Transfers: Sit to/from Stand Sit to Stand: Mod assist;Max assist;+2 physical assistance   Squat pivot transfers: Total assist     General transfer comment: able to stand to [facility] with A of 2 and use of pad, to use Stedy to pivot to recliner then assist to stand and assist with perineal hygiene prior to sitting on recliner.  Ambulation/Gait                 Stairs             Wheelchair Mobility    Modified Rankin (Stroke Patients Only)       Balance Overall balance assessment: Needs assistance Sitting-balance support: Feet supported Sitting balance-Leahy Scale: Poor Sitting balance - Comments: leaning posteriorly minguard to min A for balance sitting EOB     Standing balance-Leahy Scale: Poor Standing balance comment: Aof 2 for  standing for safety                            Cognition Arousal/Alertness: Awake/alert Behavior During Therapy: Flat affect Overall Cognitive Status: No family/caregiver present to determine baseline cognitive functioning                                 General Comments: follows one step commands consistently      Exercises Other Exercises Other Exercises: LAQ x 5 seated in chair    General Comments General comments (skin integrity, edema, etc.): VSS during OOB transfer      Pertinent Vitals/Pain Pain Assessment: Faces Faces Pain Scale: Hurts little more Pain Location: generalized, with grimacing, but denies and does not localize Pain Descriptors / Indicators: Grimacing Pain Intervention(s): Monitored during session;Repositioned    Home Living                      Prior Function            PT Goals (current goals can now be found in the care plan section) Progress towards PT goals: Progressing toward goals    Frequency    Min 3X/week      PT Plan Discharge plan needs to be updated    Co-evaluation  AM-PAC PT "6 Clicks" Mobility   Outcome Measure  Help needed turning from your back to your side while in a flat bed without using bedrails?: A Lot Help needed moving from lying on your back to sitting on the side of a flat bed without using bedrails?: A Lot Help needed moving to and from a bed to a chair (including a wheelchair)?: Total Help needed standing up from a chair using your arms (e.g., wheelchair or bedside chair)?: A Lot Help needed to walk in hospital room?: Total Help needed climbing 3-5 steps with a railing? : Total 6 Click Score: 9    End of Session Equipment Utilized During Treatment: Gait belt;Oxygen Activity Tolerance: Patient tolerated treatment well Patient left: in chair;with call bell/phone within reach;with chair alarm set Nurse Communication: Need for lift equipment PT Visit  Diagnosis: Other abnormalities of gait and mobility (R26.89);Muscle weakness (generalized) (M62.81);Unsteadiness on feet (R26.81)     Time: 1202-1226 PT Time Calculation (min) (ACUTE ONLY): 24 min  Charges:  $Therapeutic Activity: 23-37 mins                     Sheran LawlessCyndi Derrion Tritz, [place] CarolinaPT Acute Rehabilitation Services [phone] 10/14/2018    Elray Mcgregorynthia Avion Patella 10/14/2018, 12:44 PM

## 2018-10-14 NOTE — Progress Notes (Signed)
Pt had nausea and vomiting after drinking Ensure tonight. Zofran IV given.  Day shift reported Pt had low appetite, refused to eat and she had nausea and vomiting yesterday after taking medicine crushed with apple sauce. She had active bowel sound. Her abdomen distended, denied abdominal pain. She had bowel movement tonight and yesterday.   This morning her temperature 101.2 F and her respiratory rate 28, SPO2 86-88 % with room air. O2 NCL 2 LPM given normal BP and HR, sinus rhythm with PAC on monitor.  Will continue to monitor.   Kennyth Lose, RN

## 2018-10-14 NOTE — Progress Notes (Signed)
Initial Nutrition Assessment  DOCUMENTATION CODES:   Not applicable  INTERVENTION:   Suspect nausea and mental status playing a role in poor intake. Will try different supplements today. If intake does not progress through weekend recommend placement of Cortrak on Monday.    Ensure Enlive po BID, each supplement provides 350 kcal and 20 grams of protein  Magic cup TID with meals, each supplement provides 290 kcal and 9 grams of protein  MVI daily   If Cortrak placed:  Osmolite 1.5 @ 50 ml/hr (1200 ml) 30 ml Prostat BID Provides: 2000 kcals, 105 grams protein, 914 ml free water.   NUTRITION DIAGNOSIS:   Inadequate oral intake related to inability to eat as evidenced by NPO status.  Diet advanced   GOAL:   Patient will meet greater than or equal to 90% of their needs  Not meeting  MONITOR:   PO intake, Supplement acceptance, Diet advancement, Labs, Weight trends, I & O's, Skin  REASON FOR ASSESSMENT:   Consult Enteral/tube feeding initiation and management  ASSESSMENT:   58 yo female admitted with fever, vomiting, weakness. Found to have MSSA bacteremia with PNA. Echo showed vegetation. Required transfer to the ICU and intubation on 8/4. PMH includes developmental delay, GERD, anxiety, hypothyroidism, HLD.   8/06 - TEE showing large aortic valve vegetation 8/07 - s/p aortic valve replacement 8/08 - extubated, diet advanced to Dysphagia 2  Pt seemed confused at bedside. Spoke with RN who reports pt was nauseous this am (no vomiting). Meal completions charted as 0-50% for pt's last 8 meals with her last two meals refused. She is not taking Ensure supplement. Suspect nausea and mental status playing a role in poor intake. Will try different supplement today. If intake does not progress through weekend recommend placement of Cortrak on Monday.   Admission weight: 77.7 kg Current weight: 94.3 kg  I/O: +10, 475 ml x 24 hrs  UOP: 800 ml x 24 hrs   Medications:  calcium- Vit D, dulcolax, colace, ferrous sulfate, reglan, MVI with minerals Labs: CBG 95-163  Diet Order:   Diet Order            DIET DYS 2 Room service appropriate? Yes; Fluid consistency: Thin  Diet effective now              EDUCATION NEEDS:   No education needs have been identified at this time  Skin:  Skin Assessment: Skin Integrity Issues: Skin Integrity Issues:: DTI, Incisions DTI: right ear Incisions: chest  Last BM:  8/14  Height:   Ht Readings from Last 1 Encounters:  10/07/18 5\' 2"  (1.575 m)    Weight:   Wt Readings from Last 1 Encounters:  10/14/18 94.3 kg    Ideal Body Weight:  56.8 kg  BMI:  Body mass index is 38.02 kg/m.  Estimated Nutritional Needs:   Kcal:  1800-2000  Protein:  100-115 gm  Fluid:  >/= 1.8 L   Mariana Single RD, LDN Clinical Nutrition Pager # [phone]

## 2018-10-14 NOTE — Progress Notes (Deleted)
Pt HR sustaining in 130s/140s, as high as 170 and lowest 83. Pt is in AFib with 3 runs of Vtach as per telemetry. Pt asymptomatic and resting comfortably in bed. Verbal orders received from [facility] for Amiodarone 150mg  IV bolus once. Will continue to monitor.

## 2018-10-15 ENCOUNTER — Inpatient Hospital Stay (HOSPITAL_COMMUNITY): Payer: Medicaid Other

## 2018-10-15 LAB — CBC
HCT: 26 % — ABNORMAL LOW (ref 36.0–46.0)
Hemoglobin: 7.8 g/dL — ABNORMAL LOW (ref 12.0–15.0)
MCH: 28.2 pg (ref 26.0–34.0)
MCHC: 30 g/dL (ref 30.0–36.0)
MCV: 93.9 fL (ref 80.0–100.0)
Platelets: 222 10*3/uL (ref 150–400)
RBC: 2.77 MIL/uL — ABNORMAL LOW (ref 3.87–5.11)
RDW: 16.6 % — ABNORMAL HIGH (ref 11.5–15.5)
WBC: 12.1 10*3/uL — ABNORMAL HIGH (ref 4.0–10.5)
nRBC: 0 % (ref 0.0–0.2)

## 2018-10-15 LAB — BASIC METABOLIC PANEL
Anion gap: 8 (ref 5–15)
BUN: 31 mg/dL — ABNORMAL HIGH (ref 6–20)
CO2: 22 mmol/L (ref 22–32)
Calcium: 8.1 mg/dL — ABNORMAL LOW (ref 8.9–10.3)
Chloride: 113 mmol/L — ABNORMAL HIGH (ref 98–111)
Creatinine, Ser: 1.78 mg/dL — ABNORMAL HIGH (ref 0.44–1.00)
GFR calc Af Amer: 36 mL/min — ABNORMAL LOW (ref >60)
GFR calc non Af Amer: 31 mL/min — ABNORMAL LOW (ref >60)
Glucose, Bld: 104 mg/dL — ABNORMAL HIGH (ref 70–99)
Potassium: 3.6 mmol/L (ref 3.5–5.1)
Sodium: 143 mmol/L (ref 135–145)

## 2018-10-15 NOTE — Progress Notes (Signed)
      [address]       [place] [address]             [phone]      8 Days Post-Op Procedure(s) (LRB): AORTIC VALVE REPLACEMENT (AVR) (N/A) TRANSESOPHAGEAL ECHOCARDIOGRAM (TEE) (N/A)   Subjective:  Patient states doesn't feel good.  Has no specific complaints other than wanting to go home.  Denies any further N/V  Objective: Vital signs in last 24 hours: Temp:  [98.3 F (36.8 C)-99.2 F (37.3 C)] 98.9 F (37.2 C) (08/15 0756) Pulse Rate:  [75-91] 87 (08/15 0756) Cardiac Rhythm: Normal sinus rhythm (08/15 0700) Resp:  [20-26] 23 (08/15 0756) BP: (123-135)/(73-85) 127/75 (08/15 0756) SpO2:  [95 %-99 %] 97 % (08/15 0756) Weight:  [93.8 kg] 93.8 kg (08/15 0424)  Intake/Output from previous day: 08/14 0701 - 08/15 0700 In: 240 [P.O.:240] Out: 600 [Urine:600]  General appearance: alert, cooperative and no distress Heart: regular rate and rhythm Lungs: clear to auscultation bilaterally Abdomen: soft, non-tender; bowel sounds normal; no masses,  no organomegaly Extremities: edema trace-1+ Wound: clean and dry  Lab Results: Recent Labs    10/13/18 0432 10/15/18 0531  WBC 13.4* 12.1*  HGB 8.5* 7.8*  HCT 27.9* 26.0*  PLT 136* 222   BMET:  Recent Labs    10/14/18 0340 10/15/18 0531  NA 145 143  K 3.6 3.6  CL 115* 113*  CO2 21* 22  GLUCOSE 133* 104*  BUN 33* 31*  CREATININE 1.79* 1.78*  CALCIUM 7.9* 8.1*    PT/INR: No results for input(s): LABPROT, INR in the last 72 hours. ABG    Component Value Date/Time   PHART 7.430 10/08/2018 0920   HCO3 23.6 10/08/2018 0920   TCO2 25 10/08/2018 0920   ACIDBASEDEF 6.0 (H) 10/08/2018 0307   O2SAT 94.0 10/08/2018 0920   CBG (last 3)  Recent Labs    10/12/18 1115 10/12/18 1614  GLUCAP 121* 163*    Assessment/Plan: S/P Procedure(s) (LRB): AORTIC VALVE REPLACEMENT (AVR) (N/A) TRANSESOPHAGEAL ECHOCARDIOGRAM (TEE) (N/A)  1. CV- NSR, BP ok- continue Lopressor 2. Pulm- weaning oxygen as  tolerated, continue IS 3. Renal- creatinine stable and at baseline, + volume overload, continue Lasix 4. GI- denies further N/V, she has moved bowels, continue anti-emetics as needed 5. Expected post operative blood loss anemia, patient with 2 point drop in hemoglobin over the past several days, did have episodes of N/V... no acute signs of bleeding, but with previous GI complaints and poor appetite, will having nursing check stool 6. Deconditioning- PT now recommending SNF 7. Dispo- patient with flat affect, wants to go home, hemodynamically stable on lopressor, continue diuretics, watch Hgb as she has experienced 2 point drop over past several days, will check stool for blood with previous GI symptoms, consult to social work placed for SNF  LOS: 14 days    Ellwood Handler 10/15/2018

## 2018-10-15 NOTE — TOC Initial Note (Signed)
Transition of Care [facility](TOC) - Initial/Assessment Note    Patient Details  Name: Audrey Collier MRN: [id number] Date of Birth: January 20, 1961  Transition of Care [facility](TOC) CM/SW Contact:    Eduard Rouxynthia N Lorrayne Ismael, LCSWA Phone Number: 10/15/2018, 12:47 PM  Clinical Narrative:                  CSW visit with the patient  and her aunt Kendal HymenBonnie at bedside. CSW attempted to call patient's other aunt Bjorn LoserRhonda as well to involve in conversation- she was unavailable- CSW left voice mail.   Patient's  aunt Kendal HymenBonnie states the patient lives  in the home with her Penelope GalasUncle Jimmy, Earnest Rosierunt [name] (spouse of Chanetta MarshallJimmy). The patient and her Jamse Belfastunt Josephine (stay and share the living area in the basement [facility] Patient and her Jamse Belfastunt Josephine both attends an Adult Day [facility]). They share an aide that comes to the home daily.  Patient states she [facility] ( [facility] ) during the day and has a Aide that comes to her home 3-4 hrs in the evenings.   Patient's aunt, Kendal HymenBonnie, states they prefer the patient to go inpatient rehab because family can not visit in SNF. She expressed this is very "hard on the patient" and she is "scared" and she will be too scare to be in SNF with out family there.   Patient states no questions for now  CSW will continue to follow and assist with discharge planing.  Antony Blackbirdynthia Haddie Bruhl, MSW, LCSWA Clinical Social Worker [phone]    Barriers to Discharge: Continued Medical Work up   Patient Goals and CMS Choice Patient states their goals for this hospitalization and ongoing recovery are:: to get back home      Expected Discharge Plan and Services         Living arrangements for the past 2 months: Single Family Home                                      Prior Living Arrangements/Services Living arrangements for the past 2 months: Single Family Home Lives with:: Self, Relatives Patient language and need for interpreter reviewed::  Yes        Need for Family Participation in Patient Care: Yes (Comment) Care giver support system in place?: Yes (comment)   Criminal Activity/Legal Involvement Pertinent to Current Situation/Hospitalization: No - Comment as needed  Activities of Daily Living Home Assistive Devices/Equipment: None ADL Screening (condition at time of admission) Patient's cognitive ability adequate to safely complete daily activities?: No Is the patient deaf or have difficulty hearing?: No Does the patient have difficulty seeing, even when wearing glasses/contacts?: No Does the patient have difficulty concentrating, remembering, or making decisions?: Yes Patient able to express need for assistance with ADLs?: No Does the patient have difficulty dressing or bathing?: No Independently performs ADLs?: No Communication: Independent Dressing (OT): Needs assistance Does the patient have difficulty walking or climbing stairs?: No Weakness of Legs: Both Weakness of Arms/Hands: None  Permission Sought/Granted Permission sought to share information with : Family Supports Permission granted to share information with : Yes, Verbal Permission Granted  Share Information with NAME: Leta BaptistRhonda Slape     Permission granted to share info w Relationship: Aunt  Permission granted to share info w Contact Information: [phone]  Emotional Assessment Appearance:: Appears stated age Attitude/Demeanor/Rapport: (quitet, observant, friendly) Affect (typically observed):  Quiet Orientation: : Oriented to Self, Oriented to Place, Oriented to  Time, Oriented to Situation Alcohol / Substance Use: Not Applicable Psych Involvement: No (comment)  Admission diagnosis:  Acute hypoxemic respiratory failure ([name]) [J96.01] Multifocal pneumonia [J18.9] Patient Active Problem List   Diagnosis Date Noted  . Pressure injury of skin 10/10/2018  . S/P AVR (aortic valve replacement) 10/07/2018  . Acute respiratory failure with hypoxia  ([place])   . Staphylococcus aureus bacteremia with sepsis ([place]) 10/03/2018  . Bacteremia due to methicillin susceptible Staphylococcus aureus (MSSA)   . Aortic valve endocarditis   . Multifocal pneumonia   . Acute hypoxemic respiratory failure ([place])   . Severe sepsis ([place]) 10/01/2018  . GERD (gastroesophageal reflux disease) 03/18/2017  . Loose stools 09/30/2015  . Abdominal pain 11/30/2014  . Helicobacter pylori gastritis 10/24/2014  . PMB (postmenopausal bleeding) 03/26/2014  . Thickened endometrium 03/26/2014  . Endometrial polyp 03/26/2014  . Constipation 04/11/2013  . Fecal occult blood test positive 03/15/2013   PCP:  Jani Gravel, MD Pharmacy:   [facility], [place] [address] Phone: [phone] Fax: [phone]     Social Determinants of Health (SDOH) Interventions    Readmission Risk Interventions No flowsheet data found.

## 2018-10-15 NOTE — Progress Notes (Signed)
Pt refusing dinner tray. Told her if she becomes hungry at all during night to let me know and I will find something for her to eat.

## 2018-10-16 LAB — CBC
HCT: 25.3 % — ABNORMAL LOW (ref 36.0–46.0)
Hemoglobin: 7.7 g/dL — ABNORMAL LOW (ref 12.0–15.0)
MCH: 28.1 pg (ref 26.0–34.0)
MCHC: 30.4 g/dL (ref 30.0–36.0)
MCV: 92.3 fL (ref 80.0–100.0)
Platelets: 262 10*3/uL (ref 150–400)
RBC: 2.74 MIL/uL — ABNORMAL LOW (ref 3.87–5.11)
RDW: 16.4 % — ABNORMAL HIGH (ref 11.5–15.5)
WBC: 10.6 10*3/uL — ABNORMAL HIGH (ref 4.0–10.5)
nRBC: 0 % (ref 0.0–0.2)

## 2018-10-16 LAB — URINALYSIS, ROUTINE W REFLEX MICROSCOPIC
Bacteria, UA: NONE SEEN
Bilirubin Urine: NEGATIVE
Glucose, UA: NEGATIVE mg/dL
Ketones, ur: NEGATIVE mg/dL
Nitrite: NEGATIVE
Protein, ur: 100 mg/dL — AB
Specific Gravity, Urine: 1.013 (ref 1.005–1.030)
pH: 5 (ref 5.0–8.0)

## 2018-10-16 LAB — BASIC METABOLIC PANEL
Anion gap: 10 (ref 5–15)
BUN: 27 mg/dL — ABNORMAL HIGH (ref 6–20)
CO2: 22 mmol/L (ref 22–32)
Calcium: 8.1 mg/dL — ABNORMAL LOW (ref 8.9–10.3)
Chloride: 110 mmol/L (ref 98–111)
Creatinine, Ser: 1.65 mg/dL — ABNORMAL HIGH (ref 0.44–1.00)
GFR calc Af Amer: 40 mL/min — ABNORMAL LOW (ref >60)
GFR calc non Af Amer: 34 mL/min — ABNORMAL LOW (ref >60)
Glucose, Bld: 99 mg/dL (ref 70–99)
Potassium: 3.5 mmol/L (ref 3.5–5.1)
Sodium: 142 mmol/L (ref 135–145)

## 2018-10-16 LAB — OCCULT BLOOD X 1 CARD TO LAB, STOOL: Fecal Occult Bld: NEGATIVE

## 2018-10-16 NOTE — Progress Notes (Addendum)
      [address]       [place] [address]             [phone]      9 Days Post-Op Procedure(s) (LRB): AORTIC VALVE REPLACEMENT (AVR) (N/A) TRANSESOPHAGEAL ECHOCARDIOGRAM (TEE) (N/A)   Subjective:  Patient again states she wants to go home. She denies pain, N/V.  Working on dispo planning currently.  Objective: Vital signs in last 24 hours: Temp:  [97.4 F (36.3 C)-98.9 F (37.2 C)] 98.3 F (36.8 C) (08/16 0740) Pulse Rate:  [77-88] 84 (08/16 0740) Cardiac Rhythm: Normal sinus rhythm (08/16 0700) Resp:  [22-27] 22 (08/16 0740) BP: (99-132)/(66-82) 122/66 (08/16 0740) SpO2:  [95 %-99 %] 97 % (08/16 0740) Weight:  [97.3 kg] 97.3 kg (08/16 0504)  Intake/Output from previous day: 08/15 0701 - 08/16 0700 In: 720 [P.O.:720] Out: 1779 [Urine:1779]  General appearance: alert, cooperative and no distress Heart: regular rate and rhythm Lungs: clear to auscultation bilaterally Abdomen: soft, non-tender; bowel sounds normal; no masses,  no organomegaly Extremities: edema trace Wound: clean and dry  Lab Results: Recent Labs    10/15/18 0531 10/16/18 0330  WBC 12.1* 10.6*  HGB 7.8* 7.7*  HCT 26.0* 25.3*  PLT 222 262   BMET:  Recent Labs    10/15/18 0531 10/16/18 0330  NA 143 142  K 3.6 3.5  CL 113* 110  CO2 22 22  GLUCOSE 104* 99  BUN 31* 27*  CREATININE 1.78* 1.65*  CALCIUM 8.1* 8.1*    PT/INR: No results for input(s): LABPROT, INR in the last 72 hours. ABG    Component Value Date/Time   PHART 7.430 10/08/2018 0920   HCO3 23.6 10/08/2018 0920   TCO2 25 10/08/2018 0920   ACIDBASEDEF 6.0 (H) 10/08/2018 0307   O2SAT 94.0 10/08/2018 0920   CBG (last 3)  No results for input(s): GLUCAP in the last 72 hours.  Assessment/Plan: S/P Procedure(s) (LRB): AORTIC VALVE REPLACEMENT (AVR) (N/A) TRANSESOPHAGEAL ECHOCARDIOGRAM (TEE) (N/A)  1. CV- hemodynamically stable in NSR- continue Lopressor 2. Pulm- no acute issues, continue IS 3.  Renal- creatinine remains stable, K WNL- remains volume overloaded will continue Lasix 4. Expected post operative blood loss anemia, Hgb is relatively stable at 7.7, on iron supplement, Heme cards haven't been collected 5. GI- no further GI issues, complaints.[name] ate some food yesterday, but refused dinner tray, continue to encourage oral intake 6. Deconditioning- patient needs placement CIR vs. SNF... family prefers CIR, however PT recs SNF 7. Dispo- patient stable, she is medically stable for discharge once placement can be arranged   Addendum; per nursing patient having issues voiding.. Patient denies complaints.  Will check UA   LOS: 15 days    Ellwood Handler 10/16/2018

## 2018-10-16 NOTE — TOC Progression Note (Addendum)
Transition of Care [facility]) - Progression Note    Patient Details  Name: Audrey Collier MRN: [id number] Date of Birth: 14-Sep-1960  Transition of Care [facility]) CM/SW [place], [place] Phone Number: 10/16/2018, 2:21 PM  Clinical Narrative:     CSW spoke with the patient's Audrey Collier. She expressed patient really needs rehab before coming home and prefers CIR since no visitors are allowed in nursing home. Caitlin, with CIR is following-CSW will follow and wait on recommendations.  She states it is hard to reach her during work hours because she unable to have her phone while working on the floor. She has to wait until her break to return or receive calls.   CSW will continue to follow and assist with discharge planning .  [name], MSW, LCSWA Clinical Social Worker [phone]      Barriers to Discharge: Continued Medical Work up  Expected Discharge Plan and Services         Living arrangements for the past 2 months: Single Family Home                                       Social Determinants of Health (SDOH) Interventions    Readmission Risk Interventions No flowsheet data found.

## 2018-10-17 MED ORDER — POTASSIUM CHLORIDE CRYS ER 20 MEQ PO TBCR
20.0000 meq | EXTENDED_RELEASE_TABLET | Freq: Once | ORAL | Status: AC
  Administered 2018-10-17: 12:00:00 20 meq via ORAL
  Filled 2018-10-17: qty 1

## 2018-10-17 NOTE — Progress Notes (Signed)
Inpatient Rehab Admissions Coordinator:   Met with pt and spoke to her sister in law on the phone.  Discussed slow progress, and pt may not be able to tolerate CIR level therapies.  Family hopeful for CIR, but understanding if not an option.  Encouraged pt to participate in therapy and spend more time in her chair to increase tolerance.  Of note, OT in room as I was leaving. OT states that after [facility] and RN left room, pt refused to transfer out of bed, actively resisting attempts to assist transfer to stedy to get to recliner.  Will follow 1-2 more days to determine if pt could tolerate CIR.   Shann Medal, PT, DPT Admissions Coordinator [phone] 10/17/18  1:23 PM

## 2018-10-17 NOTE — Progress Notes (Addendum)
      [address]       [place] [address]             [phone]        10 Days Post-Op Procedure(s) (LRB): AORTIC VALVE REPLACEMENT (AVR) (N/A) TRANSESOPHAGEAL ECHOCARDIOGRAM (TEE) (N/A)  Subjective: Patient just waking up this am. She denies nausea, vomiting, or abdominal pain.  Objective: Vital signs in last 24 hours: Temp:  [97.5 F (36.4 C)-98.8 F (37.1 C)] 97.5 F (36.4 C) (08/17 0532) Pulse Rate:  [66-84] 76 (08/17 0532) Cardiac Rhythm: Normal sinus rhythm (08/16 2000) Resp:  [19-22] 19 (08/17 0532) BP: (112-133)/(66-82) 133/82 (08/17 0532) SpO2:  [96 %-100 %] 98 % (08/17 0532) Weight:  [95.3 kg] 95.3 kg (08/17 0532)  Pre op weight 83.9 kg Current Weight  10/17/18 95.3 kg      Intake/Output from previous day: 08/16 0701 - 08/17 0700 In: 74 [P.O.:480; IV Piggyback:100] Out: 490 [Urine:490]   Physical Exam:  Cardiovascular: RRR Pulmonary: Slightly diminished left bibasilar breath sounds Abdomen: Soft, non tender, bowel sounds present. Extremities: ++ LE edema Wound: Clean and dry.  No erythema or signs of infection.  Lab Results: CBC: Recent Labs    10/15/18 0531 10/16/18 0330  WBC 12.1* 10.6*  HGB 7.8* 7.7*  HCT 26.0* 25.3*  PLT 222 262   BMET:  Recent Labs    10/15/18 0531 10/16/18 0330  NA 143 142  K 3.6 3.5  CL 113* 110  CO2 22 22  GLUCOSE 104* 99  BUN 31* 27*  CREATININE 1.78* 1.65*  CALCIUM 8.1* 8.1*    PT/INR:  Lab Results  Component Value Date   INR 1.7 (H) 10/07/2018   INR 1.4 (H) 10/01/2018   ABG:  INR: Will add last result for INR, ABG once components are confirmed Will add last 4 CBG results once components are confirmed  Assessment/Plan:  1. CV - SR in the 70's. On Lopressor 25 mg bid. No ACE/ARB secondary to elevated creatinine. 2.  Pulmonary - On 3 liters this am.   Encourage incentive spirometer 3. AKI- creatinine this am decreased to 1.65. Creatinine upon admission 1.7. She is volume  overloaded so will continue Lasix 40 mg daily. 4. Expected acute blood loss anemia -  H and H yesterday decreased to 7.7 and 25.3. Occult blood negative. Continue oral ferrous 5. Mild thrombocytopenia-last platelets increased to 136,000 6. ID-WBC yesterday decreased to 10,600. On Cefazolin 2 g bid and Rifampin 300 mg tid for MSSA. Per infectious disease, will need a total of 6 weeks (last dose 09/17) 7. Hypothyroidism-on Levothyroxine 125 mcg daily 8. UA-protein, trace leukocytes, 11 to 20 WBC,no bacteria 9. GI-previous N/V now resolved. Encourage po 10. Supplement potassium 11. PT/OT recommendations are to CIR. Hopefully, candidate for CIR  Donielle M ZimmermanPA-C 10/17/2018,7:05 AM [phone]   Agree with above Continue diuresis Awaiting CIR transfer.

## 2018-10-17 NOTE — Progress Notes (Signed)
CARDIAC REHAB PHASE I   PRE:  Rate/Rhythm: 82 SR  BP:  Sitting: 129/89      SaO2: 93 3L  MODE:  Ambulation: stand and pivot to [facility] and chair   POST:  Rate/Rhythm: 87 SR  BP:  Sitting: 128/87    SaO2: 98 3L   Pt a heavy assist x2 with gait belt and rolling walker to stand and pivot to [facility], and chair. Pt needs a lot of cues to stand and sit back further in chair. Pt always stayed hunched over. Pt set-up for breakfast, requiring cues and mod assist to feed herself. Pt with a small amount of vomit after eating.  Coached through use of flutter and IS. Pt able to get 375 on IS once, then kept blowing into it. Chair alarm on, call bell within reach, RN aware.    [id number] Rufina Falco, RN BSN 10/17/2018 9:42 AM

## 2018-10-17 NOTE — Progress Notes (Signed)
Occupational Therapy Treatment Patient Details Name: Audrey Collier MRN: [id number] DOB: [birth year] Today's Date: 10/17/2018    History of present illness 58 yo female presented with fever, vomiting, weakness.  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  Echo showed vegetation.  Transferred to ICU 8/04 due to respiratory failure.  Underwent AVR with TEE intraoprative on 10/07/18. Extubated 10/08/18.   OT comments  Upon entering room, pt's RN and PT case manager from CIR in with pt. RN states that pt up earlier this morning and then back to bed after sitting in recliner for a little while. After RN and PT from CIR left pt's room, pt stated "I don't want to get up, I've already been up today". OT educated pt on importance/benefits of being OOB and working with therapy acutely to go to CIR. Pt reluctantly agreeable and sat EOB with mod A with bed mobility for washing hands/face sitting EOB, simulated bathing tasks and to don clean gown. OT called RN back in to assist using Stedy and pt required mod A +2 x 3 attempts for sit - stand and was being resistive to stand upright (pushing against RN and OT, trying to sit back onto bed) for proper use of Stedy. Pt properly positioned in [place] with max A + 2 and transferred to recliner. OT will continue to follow acutely  Follow Up Recommendations  SNF;Supervision/Assistance - 24 hour    Equipment Recommendations  3 in 1 bedside commode    Recommendations for Other Services      Precautions / Restrictions Precautions Precautions: Fall;Sternal Precaution Comments: verbally reviewed with patient during functional tasks  Restrictions Weight Bearing Restrictions: Yes Other Position/Activity Restrictions: sternal precautions        Mobility Bed Mobility Overal bed mobility: Needs Assistance       Supine to sit: HOB elevated;Mod assist     General bed mobility comments: initiates with moving legs to EOB with verbal cues x 3, assist to eleavte  trunk  Transfers Overall transfer level: Needs assistance   Transfers: Sit to/from Stand Sit to Stand: Mod assist;+2 physical assistance              Balance Overall balance assessment: Needs assistance Sitting-balance support: Feet supported Sitting balance-Leahy Scale: Fair                                     ADL either performed or assessed with clinical judgement   ADL Overall ADL's : Needs assistance/impaired Eating/Feeding: Sitting;Set up   Grooming: Wash/dry hands;Wash/dry face;Sitting;Min guard           Upper Body Dressing : Moderate assistance;Sitting Upper Body Dressing Details (indicate cue type and reason): donned clean gown Lower Body Dressing: Total assistance   Toilet Transfer: +2 for physical assistance;+2 for safety/equipment;Total assistance;Moderate assistance Toilet Transfer Details (indicate cue type and reason): mod A + 2 for sit to stand from bed x 3 attempts as pt resisting to come to upright stand although stating that she would get OOB, used Stedy to transfer pt Toileting- ArchitectClothing Manipulation and Hygiene: Total assistance       Functional mobility during ADLs: Moderate assistance;+2 for physical assistance;Total assistance General ADL Comments: used Stedy for transfer from EOB     Vision Baseline Vision/History: Wears glasses Patient Visual Report: No change from baseline     Perception     Praxis      Cognition  Behavior During Therapy: Flat affect Overall Cognitive Status: No family/caregiver present to determine baseline cognitive functioning                                 General Comments: follows one step commands consistently        Exercises     Shoulder Instructions       General Comments      Pertinent Vitals/ Pain       Pain Assessment: No/denies pain  Home Living Family/patient expects to be discharged to:: Private residence Living Arrangements: Other relatives Available  Help at Discharge: Family;Available 24 hours/day Type of Home: House                                  Prior Functioning/Environment              Frequency  Min 2X/week        Progress Toward Goals  OT Goals(current goals can now be found in the care plan section)  Progress towards OT goals: OT to reassess next treatment     Plan Frequency remains appropriate;Discharge plan needs to be updated    Co-evaluation                 AM-PAC OT "6 Clicks" Daily Activity     Outcome Measure   Help from another person eating meals?: A Little Help from another person taking care of personal grooming?: A Little Help from another person toileting, which includes using toliet, bedpan, or urinal?: Total Help from another person bathing (including washing, rinsing, drying)?: A Lot Help from another person to put on and taking off regular upper body clothing?: A Lot Help from another person to put on and taking off regular lower body clothing?: Total 6 Click Score: 12    End of Session Equipment Utilized During Treatment: Gait belt;Other (comment)(Stedy)  OT Visit Diagnosis: Other abnormalities of gait and mobility (R26.89);Muscle weakness (generalized) (M62.81);Pain   Activity Tolerance Patient tolerated treatment well   Patient Left in chair;with call bell/phone within reach;with chair alarm set   Nurse Communication Other (comment)(RN assisted OT this session)        Time: 4580-9983 OT Time Calculation (min): 27 min  Charges: OT General Charges $OT Visit: 1 Visit OT Treatments $Self Care/Home Management : 8-22 mins $Therapeutic Activity: 8-22 mins     Britt Bottom 10/17/2018, 2:38 PM

## 2018-10-17 NOTE — Consult Note (Signed)
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 She is self-positioning to the left side to avoid pressure on the right ear pressure injury.  Topical care in place is bacitracin ointment with a silicone dressing.  No change in orders is indicated at this time  [place] team will follow, and see every 7-10 days, and will remain available to this patient, the nursing and medical teams.   Thanks, Maudie Flakes, MSN, RN, [place], Arther Abbott  Pager# [phone]

## 2018-10-18 MED ORDER — METOLAZONE 5 MG PO TABS
5.0000 mg | ORAL_TABLET | Freq: Every day | ORAL | Status: DC
  Administered 2018-10-18 – 2018-10-26 (×9): 5 mg via ORAL
  Filled 2018-10-18 (×9): qty 1

## 2018-10-18 MED ORDER — POTASSIUM CHLORIDE CRYS ER 10 MEQ PO TBCR
10.0000 meq | EXTENDED_RELEASE_TABLET | Freq: Every day | ORAL | Status: DC
  Administered 2018-10-18: 10:00:00 10 meq via ORAL
  Filled 2018-10-18: qty 1

## 2018-10-18 NOTE — TOC Progression Note (Signed)
Transition of Care [facility]) - Progression Note    Patient Details  Name: Audrey Collier MRN: [id number] Date of Birth: May 04, 1960  Transition of Care [facility]) CM/SW Contact  [name], LCSW Phone Number: 10/18/2018, 3:16 PM  Clinical Narrative: CSW visited room and talked with patient and her aunt, Audrey Collier [phone] (h) & [phone] (home) regarding patient discharge plan. [name] allowed her aunt to lead and actively participate in the conversation. Ms. Audrey Collier reported that she lives in [place] and would like patient to go to [facility] for rehab, as she lives 2 miles from that facility. Patient and aunt advised that Medicaid does not pay for rehab and that she must stay in the facility for at least 30 days for Medicaid to pay and this was discussed and [name]'s questions answered. Ms. Audrey Collier explained that her niece lives with her uncle Audrey Collier ([name]'s brother) and his wife Audrey Collier. Audrey Collier lives downstairs with another family member who has mobility issues. Ms Audrey Collier indicated that she wants to be there for her niece because her brother's health will not allow him to visit and also because of the compassion and care she has for Audrey Collier.    Facility search areas were discussed and aunt is interested in a [place], [place] area search if [facility] accept her and this was discussed. Ms. Audrey Collier was made aware that [place] Medicaid may not pay for a facility in [place]. CSW advised that Audrey Collier's niece Audrey Collier has been a caretaker for the patient. Ms. Audrey Collier and patient informed CSW that she and her aunt Audrey Collier attend [facility] in [place], [place]. And receives caretaker services on the weekends.     Barriers to Discharge: Continued Medical Work up  Expected Discharge Plan and Services - SNF placement with nursing services         Living arrangements for the past 2 months: Single Family Home                           Social Determinants of Health (SDOH) Interventions  No SDOH interventions needed at this time  Readmission Risk Interventions No flowsheet data found.

## 2018-10-18 NOTE — Progress Notes (Signed)
Inpatient Rehab Admissions Coordinator:    Pt medically stable for discharge, per discussion with Case Manager.  Given pt's participation with therapy, feel she would not be able to tolerate CIR level therapies.  Will sign off and recommend SNF.  Will let family know.   Shann Medal, PT, DPT Admissions Coordinator [phone] 10/18/18  10:44 AM

## 2018-10-18 NOTE — Progress Notes (Addendum)
      [address]       [place] [address]             [phone]        11 Days Post-Op Procedure(s) (LRB): AORTIC VALVE REPLACEMENT (AVR) (N/A) TRANSESOPHAGEAL ECHOCARDIOGRAM (TEE) (N/A)  Subjective: Patient just waking up this am.   Objective: Vital signs in last 24 hours: Temp:  [97.8 F (36.6 C)-97.9 F (36.6 C)] 97.8 F (36.6 C) (08/18 0352) Pulse Rate:  [71-80] 72 (08/18 0352) Cardiac Rhythm: Normal sinus rhythm (08/18 0521) Resp:  [15-23] 15 (08/18 0352) BP: (112-134)/(71-78) 134/76 (08/18 0352) SpO2:  [95 %-99 %] 96 % (08/18 0352) Weight:  [93.6 kg] 93.6 kg (08/18 0352)  Pre op weight 83.9 kg Current Weight  10/18/18 93.6 kg      Intake/Output from previous day: 08/17 0701 - 08/18 0700 In: 4544 [I.V.:4244; IV Piggyback:300] Out: -    Physical Exam:  Cardiovascular: RRR Pulmonary: Slightly diminished left bibasilar breath sounds Abdomen: Soft, non tender, bowel sounds present. Extremities: ++ UE/LE edema Wound: Clean and dry.  No erythema or signs of infection.  Lab Results: CBC: Recent Labs    10/16/18 0330  WBC 10.6*  HGB 7.7*  HCT 25.3*  PLT 262   BMET:  Recent Labs    10/16/18 0330  NA 142  K 3.5  CL 110  CO2 22  GLUCOSE 99  BUN 27*  CREATININE 1.65*  CALCIUM 8.1*    PT/INR:  Lab Results  Component Value Date   INR 1.7 (H) 10/07/2018   INR 1.4 (H) 10/01/2018   ABG:  INR: Will add last result for INR, ABG once components are confirmed Will add last 4 CBG results once components are confirmed  Assessment/Plan:  1. CV - SR in the 70's. On Lopressor 25 mg bid. No ACE/ARB secondary to elevated creatinine. 2.  Pulmonary - On room air this am.   Encourage incentive spirometer 3. AKI- creatinine this am decreased to 1.65. Creatinine upon admission 1.7. She is volume overloaded and has not responded well to Lasix. As discussed with Dr. Kipp Brood, will stop Lasix and start Zaroxolyn 5 mg daily. 4. Expected  acute blood loss anemia -  Last H and H decreased to 7.7 and 25.3. Occult blood negative. Continue oral ferrous 5. Mild thrombocytopenia-last platelets increased to 136,000 6. ID-Last WBC decreased to 10,600. On Cefazolin 2 g bid and Rifampin 300 mg tid for MSSA. Per infectious disease, will need a total of 6 weeks (last dose 09/17) 7. Hypothyroidism-on Levothyroxine 125 mcg daily 8. UA-protein, trace leukocytes, 11 to 20 WBC,no bacteria 9. GI-previous N/V now resolved. Encourage po 10. PT/OT recommendations are to CIR. Hopefully, candidate for CIR but she must be able to participate in therapy. Will discuss with care management if would be able to go to SNF.  Donielle M ZimmermanPA-C 10/18/2018,7:06 AM [phone]

## 2018-10-18 NOTE — Progress Notes (Signed)
Physical Therapy Treatment Patient Details Name: Audrey Collier MRN: [id number] DOB: 1961/01/09 Today's Date: 10/18/2018    History of Present Illness 58 yo female presented with fever, vomiting, weakness.  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  Echo showed vegetation.  Transferred to ICU 8/04 due to respiratory failure.  Underwent AVR with TEE intraoprative on 10/07/18. Extubated 10/08/18.    PT Comments    Pt needed encouragement to participate today.  Utilized the STEDY again to optimize safety for sit to stand and upright stance.  Pt attained submaximal upright stance 3 out of 4 trials, with use of the pad to help pt "tuck"/extend her hips.   Follow Up Recommendations  SNF     Equipment Recommendations  Other (comment)(TBA)    Recommendations for Other Services Rehab consult     Precautions / Restrictions Restrictions Other Position/Activity Restrictions: sternal precautions     Mobility  Bed Mobility               General bed mobility comments: pt OOB in the chair and returned to the chair  Transfers Overall transfer level: Needs assistance   Transfers: Sit to/from Stand Sit to Stand: +2 physical assistance;Max assist(x4, 1 from chair and 3 from stedy's seat.)            Ambulation/Gait             General Gait Details: unable   Stairs             Wheelchair Mobility    Modified Rankin (Stroke Patients Only)       Balance   Sitting-balance support: Feet supported Sitting balance-Leahy Scale: Fair Sitting balance - Comments: used UE to come forward, but could maintain balance against the rake of the seat cushion  posteriorly     Standing balance-Leahy Scale: Poor                              Cognition Arousal/Alertness: Awake/alert Behavior During Therapy: Flat affect Overall Cognitive Status: History of cognitive impairments - at baseline                                 General Comments:  follows one step commands consistently      Exercises      General Comments General comments (skin integrity, edema, etc.): VSS, pt needed significant assist to scoot posteriorly in the chair.      Pertinent Vitals/Pain Pain Assessment: Faces Faces Pain Scale: Hurts little more Pain Location: generalized Pain Descriptors / Indicators: Grimacing Pain Intervention(s): Monitored during session    Home Living                      Prior Function            PT Goals (current goals can now be found in the care plan section) Acute Rehab PT Goals Patient Stated Goal: agreeable to get stronger PT Goal Formulation: With patient Time For Goal Achievement: 10/22/18 Potential to Achieve Goals: Good Progress towards PT goals: Progressing toward goals(slow progress)    Frequency    Min 3X/week      PT Plan Current plan remains appropriate    Co-evaluation              AM-PAC PT "6 Clicks" Mobility   Outcome Measure  Help needed turning from your back  to your side while in a flat bed without using bedrails?: A Lot Help needed moving from lying on your back to sitting on the side of a flat bed without using bedrails?: A Lot Help needed moving to and from a bed to a chair (including a wheelchair)?: Total Help needed standing up from a chair using your arms (e.g., wheelchair or bedside chair)?: A Lot Help needed to walk in hospital room?: Total Help needed climbing 3-5 steps with a railing? : Total 6 Click Score: 9    End of Session Equipment Utilized During Treatment: Gait belt Activity Tolerance: Patient tolerated treatment well;Patient limited by fatigue Patient left: in chair;with call bell/phone within reach;with chair alarm set   PT Visit Diagnosis: Unsteadiness on feet (R26.81);Muscle weakness (generalized) (M62.81);Difficulty in walking, not elsewhere classified (R26.2)     Time: 6384-6659 PT Time Calculation (min) (ACUTE ONLY): 25 min  Charges:   $Therapeutic Activity: 23-37 mins                     10/18/2018  Donnella Sham, PT Acute Rehabilitation Services [phone]  (pager) [phone]  (office)   Tessie Fass Alphonsine Minium 10/18/2018, 5:20 PM

## 2018-10-19 ENCOUNTER — Inpatient Hospital Stay: Payer: Self-pay

## 2018-10-19 LAB — CBC
HCT: 27 % — ABNORMAL LOW (ref 36.0–46.0)
Hemoglobin: 8.3 g/dL — ABNORMAL LOW (ref 12.0–15.0)
MCH: 28.2 pg (ref 26.0–34.0)
MCHC: 30.7 g/dL (ref 30.0–36.0)
MCV: 91.8 fL (ref 80.0–100.0)
Platelets: 346 10*3/uL (ref 150–400)
RBC: 2.94 MIL/uL — ABNORMAL LOW (ref 3.87–5.11)
RDW: 16.6 % — ABNORMAL HIGH (ref 11.5–15.5)
WBC: 10.6 10*3/uL — ABNORMAL HIGH (ref 4.0–10.5)
nRBC: 0 % (ref 0.0–0.2)

## 2018-10-19 LAB — BASIC METABOLIC PANEL
Anion gap: 10 (ref 5–15)
BUN: 22 mg/dL — ABNORMAL HIGH (ref 6–20)
CO2: 23 mmol/L (ref 22–32)
Calcium: 8.3 mg/dL — ABNORMAL LOW (ref 8.9–10.3)
Chloride: 107 mmol/L (ref 98–111)
Creatinine, Ser: 1.68 mg/dL — ABNORMAL HIGH (ref 0.44–1.00)
GFR calc Af Amer: 39 mL/min — ABNORMAL LOW (ref >60)
GFR calc non Af Amer: 33 mL/min — ABNORMAL LOW (ref >60)
Glucose, Bld: 93 mg/dL (ref 70–99)
Potassium: 3.2 mmol/L — ABNORMAL LOW (ref 3.5–5.1)
Sodium: 140 mmol/L (ref 135–145)

## 2018-10-19 MED ORDER — POTASSIUM CHLORIDE CRYS ER 20 MEQ PO TBCR
20.0000 meq | EXTENDED_RELEASE_TABLET | Freq: Every day | ORAL | Status: DC
  Administered 2018-10-19 – 2018-10-26 (×8): 20 meq via ORAL
  Filled 2018-10-19 (×8): qty 1

## 2018-10-19 MED ORDER — POTASSIUM CHLORIDE CRYS ER 20 MEQ PO TBCR
40.0000 meq | EXTENDED_RELEASE_TABLET | Freq: Once | ORAL | Status: AC
  Administered 2018-10-19: 10:00:00 40 meq via ORAL
  Filled 2018-10-19: qty 2

## 2018-10-19 NOTE — TOC Progression Note (Signed)
Transition of Care [facility]) - Progression Note    Patient Details  Name: Audrey Collier MRN: [id number] Date of Birth: [birth year]  [facility]) CM/SW [place], [place] Phone Number: 10/19/2018, 1:57 PM  Clinical Narrative:     CSW received message from the RN to contact the patient's Aunt at [phone]( it appears to be [name] #)- CSW called- unable to leave message due to the VM was full.  [name], MSW, LCSWA Clinical Social Worker [phone]     Barriers to Discharge: Continued Medical Work up  Expected Discharge Plan and Services         Living arrangements for the past 2 months: Single Family Home                                       Social Determinants of Health (SDOH) Interventions    Readmission Risk Interventions No flowsheet data found.

## 2018-10-19 NOTE — NC FL2 (Signed)
[place] LEVEL OF CARE SCREENING TOOL     IDENTIFICATION  Patient Name: Audrey Collier Birthdate: 07-20-1960 Sex: female Admission Date (Current Location): 10/01/2018  [facility] and [place] Number:  Herbalist and Address:  [facility] [place], [place] [address], [place], [place] [address]      Provider Number: [id number]  Attending Physician Name and Address:  Lajuana Matte, MD  Relative Name and Phone Number:  Janmarie Smoot  ( Aunt/Unlce ) [phone] or [phone]    Current Level of Care: Hospital Recommended Level of Care: [place] Prior Approval Number:    Date Approved/Denied:   PASRR Number: [id number]  Discharge Plan: SNF    Current Diagnoses: Patient Active Problem List   Diagnosis Date Noted  . Pressure injury of skin 10/10/2018  . S/P AVR (aortic valve replacement) 10/07/2018  . Acute respiratory failure with hypoxia ([place])   . Staphylococcus aureus bacteremia with sepsis ([place]) 10/03/2018  . Bacteremia due to methicillin susceptible Staphylococcus aureus (MSSA)   . Aortic valve endocarditis   . Multifocal pneumonia   . Acute hypoxemic respiratory failure ([place])   . Severe sepsis ([place]) 10/01/2018  . GERD (gastroesophageal reflux disease) 03/18/2017  . Loose stools 09/30/2015  . Abdominal pain 11/30/2014  . Helicobacter pylori gastritis 10/24/2014  . PMB (postmenopausal bleeding) 03/26/2014  . Thickened endometrium 03/26/2014  . Endometrial polyp 03/26/2014  . Constipation 04/11/2013  . Fecal occult blood test positive 03/15/2013    Orientation RESPIRATION BLADDER Height & Weight     Self, Place  Normal External catheter, Incontinent Weight: 205 lb 11 oz (93.3 kg) Height:  5\' 2"  (157.5 cm)  BEHAVIORAL SYMPTOMS/MOOD NEUROLOGICAL BOWEL NUTRITION STATUS      Continent Diet(please see discharge summary)  AMBULATORY STATUS COMMUNICATION OF NEEDS Skin     Verbally Surgical wounds,  Other (Comment)(pressure injury ear,right, deep tissue injury, incision(closed) chest)                       Personal Care Assistance Level of Assistance  Bathing, Dressing, Feeding Bathing Assistance: Limited assistance Feeding assistance: Independent Dressing Assistance: Limited assistance     Functional Limitations Info  Sight, Hearing, Speech Sight Info: Adequate Hearing Info: Adequate Speech Info: Adequate    SPECIAL CARE FACTORS FREQUENCY                       Contractures Contractures Info: Not present    Additional Factors Info  Code Status, Allergies Code Status Info: FULL Allergies Info: NKA           Current Medications (10/19/2018):  This is the current hospital active medication list Current Facility-Administered Medications  Medication Dose Route Frequency Provider Last Rate Last Dose  . 0.9 %  sodium chloride infusion   Intravenous PRN Lajuana Matte, MD   Stopped at 10/18/18 1447  . aspirin EC tablet 325 mg  325 mg Oral Daily Lajuana Matte, MD   325 mg at 10/19/18 7209   Or  . aspirin chewable tablet 324 mg  324 mg Per Tube Daily Lajuana Matte, MD   324 mg at 10/15/18 0958  . bisacodyl (DULCOLAX) EC tablet 10 mg  10 mg Oral Daily Lajuana Matte, MD   10 mg at 10/18/18 0847   Or  . bisacodyl (DULCOLAX) suppository 10 mg  10 mg Rectal Daily Lajuana Matte, MD      .  calcium-vitamin D (OSCAL WITH D) 500-200 MG-UNIT per tablet 1 tablet  1 tablet Oral Q breakfast Corliss SkainsLightfoot, Harrell O, MD   1 tablet at 10/19/18 1000  . ceFAZolin (ANCEF) IVPB 2g/100 mL premix  2 g Intravenous Q8H Silvana NewnessMeyer, Andrew D, RPH 200 mL/hr at 10/19/18 1350 2 g at 10/19/18 1350  . Chlorhexidine Gluconate Cloth 2 % PADS 6 each  6 each Topical Daily Corliss SkainsLightfoot, Harrell O, MD   6 each at 10/16/18 1720  . docusate sodium (COLACE) capsule 200 mg  200 mg Oral Daily Corliss SkainsLightfoot, Harrell O, MD   200 mg at 10/18/18 0848  . enoxaparin (LOVENOX) injection 40 mg  40  mg Subcutaneous QHS Silvana NewnessMeyer, Andrew D, RPH   40 mg at 10/18/18 2154  . feeding supplement (ENSURE ENLIVE) (ENSURE ENLIVE) liquid 237 mL  237 mL Oral BID BM Lightfoot, Eliezer LoftsHarrell O, MD   237 mL at 10/18/18 0849  . ferrous sulfate tablet 325 mg  325 mg Oral Q breakfast Doree FudgeZimmerman, Donielle M, PA-C   325 mg at 10/19/18 1000  . FLUoxetine (PROZAC) capsule 10 mg  10 mg Oral QHS Corliss SkainsLightfoot, Harrell O, MD   10 mg at 10/18/18 2152  . levothyroxine (SYNTHROID) tablet 125 mcg  125 mcg Oral Q0600 Lawerance BachMcCarthy, Megan L, RPH   125 mcg at 10/19/18 0557  . MEDLINE mouth rinse  15 mL Mouth Rinse BID Corliss SkainsLightfoot, Harrell O, MD   15 mL at 10/18/18 2157  . metolazone (ZAROXOLYN) tablet 5 mg  5 mg Oral Daily Doree FudgeZimmerman, Donielle M, PA-C   5 mg at 10/19/18 1000  . metoprolol tartrate (LOPRESSOR) injection 2.5-5 mg  2.5-5 mg Intravenous Q2H PRN Lightfoot, Eliezer LoftsHarrell O, MD      . metoprolol tartrate (LOPRESSOR) tablet 25 mg  25 mg Oral BID Doree FudgeZimmerman, Donielle M, PA-C   25 mg at 10/19/18 1000  . montelukast (SINGULAIR) tablet 10 mg  10 mg Oral QHS Corliss SkainsLightfoot, Harrell O, MD   10 mg at 10/18/18 2153  . morphine 2 MG/ML injection 1-4 mg  1-4 mg Intravenous Q1H PRN Corliss SkainsLightfoot, Harrell O, MD   2 mg at 10/08/18 0915  . multivitamin with minerals tablet 1 tablet  1 tablet Oral Daily Corliss SkainsLightfoot, Harrell O, MD   1 tablet at 10/19/18 1000  . mupirocin cream (BACTROBAN) 2 %   Topical Daily Lightfoot, Harrell O, MD      . ondansetron (ZOFRAN) injection 4 mg  4 mg Intravenous Q6H PRN Corliss SkainsLightfoot, Harrell O, MD   4 mg at 10/19/18 1014  . oxyCODONE (Oxy IR/ROXICODONE) immediate release tablet 5-10 mg  5-10 mg Oral Q3H PRN Corliss SkainsLightfoot, Harrell O, MD   5 mg at 10/16/18 [id number]  . pantoprazole (PROTONIX) EC tablet 40 mg  40 mg Oral Daily Lightfoot, Eliezer LoftsHarrell O, MD   40 mg at 10/19/18 1000  . potassium chloride SA (K-DUR) CR tablet 20 mEq  20 mEq Oral Daily Barrett, Erin R, PA-C   20 mEq at 10/19/18 1005  . rifampin (RIFADIN) capsule 300 mg  300 mg Oral Q8H Lightfoot,  Harrell O, MD   300 mg at 10/19/18 1347  . sodium chloride flush (NS) 0.9 % injection 3 mL  3 mL Intravenous Q12H Corliss SkainsLightfoot, Harrell O, MD   3 mL at 10/18/18 2154  . sodium chloride flush (NS) 0.9 % injection 3 mL  3 mL Intravenous PRN Lightfoot, Harrell O, MD      . traMADol (ULTRAM) tablet 50-100 mg  50-100 mg Oral Q4H PRN Brynda GreathouseLightfoot, Harrell  O, MD   50 mg at 10/18/18 2003     Discharge Medications: Please see discharge summary for a list of discharge medications.  Relevant Imaging Results:  Relevant Lab Results:   Additional Information SSN [id number]4935  Emeril Stille N [place] East[place], [place]CSWA

## 2018-10-19 NOTE — Progress Notes (Signed)
      [address]       [place] [address]             [phone]      12 Days Post-Op Procedure(s) (LRB): AORTIC VALVE REPLACEMENT (AVR) (N/A) TRANSESOPHAGEAL ECHOCARDIOGRAM (TEE) (N/A)   Subjective:  No new complaints.  Patient is felt to not be a candidate for CIR.  CSW has spoken with family in regards to SNF placement which they are trying to arrange.  Objective: Vital signs in last 24 hours: Temp:  [98 F (36.7 C)-98.7 F (37.1 C)] 98 F (36.7 C) (08/19 0500) Pulse Rate:  [68-87] 76 (08/19 0500) Cardiac Rhythm: Normal sinus rhythm (08/19 0500) Resp:  [15-21] 18 (08/19 0500) BP: (97-126)/(65-81) 126/81 (08/19 0500) SpO2:  [93 %-96 %] 96 % (08/19 0500) Weight:  [93.3 kg] 93.3 kg (08/19 0500)  Intake/Output from previous day: 08/18 0701 - 08/19 0700 In: 909.6 [P.O.:440; I.V.:268; IV Piggyback:201.6] Out: 900 [Urine:900]  General appearance: alert, cooperative and no distress Heart: regular rate and rhythm Lungs: clear to auscultation bilaterally Abdomen: soft, non-tender; bowel sounds normal; no masses,  no organomegaly Extremities: edema trace Wound: clean and dry  Lab Results: Recent Labs    10/19/18 0240  WBC 10.6*  HGB 8.3*  HCT 27.0*  PLT 346   BMET:  Recent Labs    10/19/18 0240  NA 140  K 3.2*  CL 107  CO2 23  GLUCOSE 93  BUN 22*  CREATININE 1.68*  CALCIUM 8.3*    PT/INR: No results for input(s): LABPROT, INR in the last 72 hours. ABG    Component Value Date/Time   PHART 7.430 10/08/2018 0920   HCO3 23.6 10/08/2018 0920   TCO2 25 10/08/2018 0920   ACIDBASEDEF 6.0 (H) 10/08/2018 0307   O2SAT 94.0 10/08/2018 0920   CBG (last 3)  No results for input(s): GLUCAP in the last 72 hours.  Assessment/Plan: S/P Procedure(s) (LRB): AORTIC VALVE REPLACEMENT (AVR) (N/A) TRANSESOPHAGEAL ECHOCARDIOGRAM (TEE) (N/A)  1. CV- remains hemodynamically stable in NSR- on Lopressor 2. Pulm- no acute issues, continue IS 3. Renal-  creatinine remains stable, on Zaroxolyn per Dr. Kipp Brood 4. Hypokalemia- will increase potassium supplementation today 5. ID- MSSA endocarditis, ABX per ID, completion date is 9/17 6. Dispo- patient is not a candidate for CIR, CSW is working in SNF placement, patient is medically stable for discharge once bed can be arranged   LOS: 18 days    Ellwood Handler PA-C [phone]  10/19/2018

## 2018-10-19 NOTE — Plan of Care (Signed)
  Problem: Health Behavior/Discharge Planning: Goal: Ability to manage health-related needs will improve Outcome: Not Progressing   Problem: Skin Integrity: Goal: Risk for impaired skin integrity will decrease Outcome: Not Progressing   

## 2018-10-19 NOTE — TOC Progression Note (Signed)
Transition of Care [facility]) - Progression Note    Patient Details  Name: Audrey Collier MRN: [id number] Date of Birth: 03-13-1960  Transition of Care [facility]) CM/SW [place], [place] Phone Number: 10/19/2018, 6:13 PM  Clinical Narrative:     6:15 pm -CSW called patient Aunt Rhona  and caregiver message to call- to confirm she is agreeable to patient going to rehab, staying for at least 30 days and possibly giving up her monthly income to rehab.   1:30pm -CSW called Aunt Rhond- CSW left vice message   [name], MSW, LCSWA Clinical Social Worker [phone]     Barriers to Discharge: Continued Medical Work up  Expected Discharge Plan and [place] arrangements for the past 2 months: Single Family Home                                       Social Determinants of Health (SDOH) Interventions    Readmission Risk Interventions No flowsheet data found.

## 2018-10-20 ENCOUNTER — Other Ambulatory Visit: Payer: Self-pay

## 2018-10-20 MED ORDER — CEFAZOLIN IV (FOR PTA / DISCHARGE USE ONLY): 2.0000 g | Freq: Three times a day (TID) | INTRAVENOUS | 0 refills | Status: DC

## 2018-10-20 MED ORDER — POTASSIUM CHLORIDE CRYS ER 20 MEQ PO TBCR: 20.0000 meq | EXTENDED_RELEASE_TABLET | Freq: Every day | ORAL | Status: DC

## 2018-10-20 MED ORDER — METOLAZONE 5 MG PO TABS: 5.0000 mg | ORAL_TABLET | Freq: Every day | ORAL | Status: DC

## 2018-10-20 MED ORDER — SODIUM CHLORIDE 0.9% FLUSH
10.0000 mL | INTRAVENOUS | Status: DC | PRN
  Administered 2018-11-10 – 2018-11-17 (×3): 10 mL
  Administered 2018-11-23: 30 mL
  Administered 2018-11-25: 20 mL
  Administered 2018-11-25 – 2018-11-26 (×2): 10 mL
  Administered 2018-11-27: 22:00:00 20 mL
  Administered 2018-11-28: 07:00:00 30 mL
  Administered 2018-11-28: 10 mL
  Administered 2018-11-28: 20 mL
  Administered 2018-11-29: 23:00:00 10 mL
  Administered 2018-11-29: 20 mL
  Administered 2018-11-29: 30 mL
  Filled 2018-10-20 (×14): qty 40

## 2018-10-20 MED ORDER — METOPROLOL TARTRATE 25 MG PO TABS: 25.0000 mg | ORAL_TABLET | Freq: Two times a day (BID) | ORAL | Status: DC

## 2018-10-20 MED ORDER — FERROUS SULFATE 325 (65 FE) MG PO TABS: 325.0000 mg | ORAL_TABLET | Freq: Every day | ORAL | 3 refills | Status: DC

## 2018-10-20 MED ORDER — ROSUVASTATIN CALCIUM 5 MG PO TABS
5.0000 mg | ORAL_TABLET | Freq: Every day | ORAL | Status: DC
  Administered 2018-10-20 – 2018-11-22 (×29): 5 mg via ORAL
  Filled 2018-10-20 (×33): qty 1

## 2018-10-20 MED ORDER — ASPIRIN 325 MG PO TBEC: 325.0000 mg | DELAYED_RELEASE_TABLET | Freq: Every day | ORAL | 0 refills | Status: DC

## 2018-10-20 MED ORDER — SODIUM CHLORIDE 0.9% FLUSH
10.0000 mL | Freq: Two times a day (BID) | INTRAVENOUS | Status: DC
  Administered 2018-10-20 – 2018-10-26 (×9): 10 mL
  Administered 2018-10-27: 10:00:00 20 mL
  Administered 2018-10-31 – 2018-11-06 (×7): 10 mL
  Administered 2018-11-07: 20 mL
  Administered 2018-11-07 – 2018-11-08 (×2): 10 mL

## 2018-10-20 MED ORDER — TRAMADOL HCL 50 MG PO TABS
50.0000 mg | ORAL_TABLET | Freq: Four times a day (QID) | ORAL | Status: DC | PRN
  Administered 2018-10-31 – 2018-12-05 (×10): 50 mg via ORAL
  Filled 2018-10-20 (×10): qty 1

## 2018-10-20 NOTE — TOC Progression Note (Signed)
Transition of Care [facility]) - Progression Note    Patient Details  Name: LEKISHA MCGHEE MRN: [id number] Date of Birth: December 21, 1960  Transition of Care [facility]) CM/SW [place], [place] Phone Number: 10/20/2018, 4:09 PM  Clinical Narrative:   Patient has no bed offers.  CSW spoke with the patient's [name] and provided updates and further explained the SNF process. CSW requested she have the patient's other [name] to contact CSW to ensure she is involved in discharge planning for patient.  Patient Aunt [name] want CSW to send referrals to [facility] and [facility] and rehab.CSW was informed by Marvetta Gibbons, they are currently not accepting any patient due to Covid out break. CSW will fax referral out to [facility] once patient is medically ready for discharge. Per nutritional notes,  cortrak is recommended-awaiting further discussion with TCTS.  [name], MSW, LCSWA Clinical Social Worker [phone]      Barriers to Discharge: Continued Medical Work up  Expected Discharge Plan and Services         Living arrangements for the past 2 months: Single Family Home                                       Social Determinants of Health (SDOH) Interventions    Readmission Risk Interventions No flowsheet data found.

## 2018-10-20 NOTE — Progress Notes (Signed)
Physical Therapy Treatment Patient Details Name: Audrey Collier MRN: [id number] DOB: 07/26/1960 Today's Date: 10/20/2018    History of Present Illness 58 yo female presented with fever, vomiting, weakness.  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  Echo showed vegetation.  Transferred to ICU 8/04 due to respiratory failure.  Underwent AVR with TEE intraoprative on 10/07/18. Extubated 10/08/18.    PT Comments    Pt expresses she is tired today and requires encouragement to participate in therapy today. Pt ultimately requires mod A for bed mobility and minAx2 for sit>stand to [facility]. Once in [place] worked on standing balance tolerance and able to progress to static stand with minAx2 for 30 sec, also able to maintain static stand for 10 sec with min guard assist. D/c plans remain appropriate. PT will continue to follow acutely.   Follow Up Recommendations  SNF     Equipment Recommendations  Other (comment)(TBA)    Recommendations for Other Services Rehab consult     Precautions / Restrictions Precautions Precautions: Fall;Sternal Restrictions Weight Bearing Restrictions: Yes Other Position/Activity Restrictions: sternal precautions     Mobility  Bed Mobility Overal bed mobility: Needs Assistance Bed Mobility: Supine to Sit     Supine to sit: Mod assist     General bed mobility comments: modA for trunk to upright and pad scoot of hips to EoB  Transfers Overall transfer level: Needs assistance   Transfers: Sit to/from Stand Sit to Stand: +2 physical assistance;Min assist(x5, 1 from chair and 4 from stedy's seat.)            Ambulation/Gait             General Gait Details: unable         Balance   Sitting-balance support: Feet supported Sitting balance-Leahy Scale: Fair Sitting balance - Comments: used UE to come forward, but could maintain balance against the rake of the seat cushion  posteriorly     Standing balance-Leahy Scale: Poor                               Cognition Arousal/Alertness: Awake/alert Behavior During Therapy: Flat affect Overall Cognitive Status: History of cognitive impairments - at baseline                                 General Comments: follows one step commands consistently      Exercises Other Exercises Other Exercises: sit>stand x5, 1x 30 sec, 1x20 sec 3x10sec    General Comments General comments (skin integrity, edema, etc.): SaO2 >96%O2 on 2L O2      Pertinent Vitals/Pain Pain Assessment: Faces Faces Pain Scale: Hurts a little bit Pain Location: generalized Pain Descriptors / Indicators: Grimacing Pain Intervention(s): Limited activity within patient's tolerance;Monitored during session;Repositioned           PT Goals (current goals can now be found in the care plan section) Acute Rehab PT Goals Patient Stated Goal: agreeable to get stronger PT Goal Formulation: With patient Time For Goal Achievement: 10/22/18 Potential to Achieve Goals: Good Progress towards PT goals: Progressing toward goals    Frequency    Min 3X/week      PT Plan Current plan remains appropriate       AM-PAC PT "6 Clicks" Mobility   Outcome Measure  Help needed turning from your back to your side while in a flat bed without  using bedrails?: A Lot Help needed moving from lying on your back to sitting on the side of a flat bed without using bedrails?: A Lot Help needed moving to and from a bed to a chair (including a wheelchair)?: Total Help needed standing up from a chair using your arms (e.g., wheelchair or bedside chair)?: A Lot Help needed to walk in hospital room?: Total Help needed climbing 3-5 steps with a railing? : Total 6 Click Score: 9    End of Session Equipment Utilized During Treatment: Gait belt Activity Tolerance: Patient tolerated treatment well;Patient limited by fatigue Patient left: in chair;with call bell/phone within reach;with chair alarm set Nurse  Communication: Need for lift equipment PT Visit Diagnosis: Unsteadiness on feet (R26.81);Muscle weakness (generalized) (M62.81);Difficulty in walking, not elsewhere classified (R26.2)     Time: 1413-1430 PT Time Calculation (min) (ACUTE ONLY): 17 min  Charges:  $Therapeutic Exercise: 8-22 mins                     Raianna Slight B. Beverely RisenVan Fleet PT, DPT Acute Rehabilitation Services Pager [phone] Office [phone]    [name] 10/20/2018, 3:48 PM

## 2018-10-20 NOTE — Plan of Care (Signed)

## 2018-10-20 NOTE — Progress Notes (Signed)
      [address]       [place] [address]             [phone]        13 Days Post-Op Procedure(s) (LRB): AORTIC VALVE REPLACEMENT (AVR) (N/A) TRANSESOPHAGEAL ECHOCARDIOGRAM (TEE) (N/A)  Subjective: Patient just waking up this am.   Objective: Vital signs in last 24 hours: Temp:  [97.6 F (36.4 C)-98.3 F (36.8 C)] 98.2 F (36.8 C) (08/20 0453) Pulse Rate:  [69-79] 73 (08/20 0453) Cardiac Rhythm: Normal sinus rhythm (08/19 1900) Resp:  [16-24] 21 (08/20 0453) BP: (98-126)/(67-80) 118/74 (08/20 0453) SpO2:  [95 %-98 %] 98 % (08/20 0453) Weight:  [93.9 kg] 93.9 kg (08/20 0500)  Pre op weight 83.9 kg Current Weight  10/20/18 93.9 kg      Intake/Output from previous day: 08/19 0701 - 08/20 0700 In: -  Out: 1250 [Urine:1250]   Physical Exam:  Cardiovascular: RRR Pulmonary: Slightly diminished left bibasilar breath sounds Abdomen: Soft, non tender, bowel sounds present. Extremities: ++ UE/LE edema Wound: Clean and dry.  No erythema or signs of infection.  Lab Results: CBC: Recent Labs    10/19/18 0240  WBC 10.6*  HGB 8.3*  HCT 27.0*  PLT 346   BMET:  Recent Labs    10/19/18 0240  NA 140  K 3.2*  CL 107  CO2 23  GLUCOSE 93  BUN 22*  CREATININE 1.68*  CALCIUM 8.3*    PT/INR:  Lab Results  Component Value Date   INR 1.7 (H) 10/07/2018   INR 1.4 (H) 10/01/2018   ABG:  INR: Will add last result for INR, ABG once components are confirmed Will add last 4 CBG results once components are confirmed  Assessment/Plan:  1. CV - SR in the 70's. On Lopressor 25 mg bid. No ACE/ARB secondary to elevated creatinine. 2.  Pulmonary - On room air this am.   Encourage incentive spirometer 3. AKI- last creatinine stable at 1.68. Creatinine upon admission 1.7. She is volume overloaded and has not responded well to Lasix. As discussed with Dr. Kipp Brood, will stop Lasix and start Zaroxolyn 5 mg daily. 4. Expected acute blood loss anemia  -  Last H and H decreased to 8.3 and 27. Occult blood negative. Continue oral ferrous 5. Mild thrombocytopenia-last platelets increased to 136,000 6. ID-Last WBC decreased to 10,600. On Cefazolin 2 g bid and Rifampin 300 mg tid for MSSA. Per infectious disease, will need a total of 6 weeks (last dose 09/17). PICC line to be placed 7. Hypothyroidism-on Levothyroxine 125 mcg daily 8. UA-protein, trace leukocytes, 11 to 20 WBC,no bacteria 9. GI-previous N/V now resolved. Encourage po 10. Deconditioning-continue PT/OT. To SNF once bed available  Yusuf Yu M ZimmermanPA-C 10/20/2018,7:05 AM [phone]

## 2018-10-20 NOTE — Progress Notes (Signed)
Peripherally Inserted Central Catheter/Midline Placement  The IV Nurse has discussed with the patient and/or persons authorized to consent for the patient, the purpose of this procedure and the potential benefits and risks involved with this procedure.  The benefits include less needle sticks, lab draws from the catheter, and the patient may be discharged home with the catheter. Risks include, but not limited to, infection, bleeding, blood clot (thrombus formation), and puncture of an artery; nerve damage and irregular heartbeat and possibility to perform a PICC exchange if needed/ordered by physician.  Alternatives to this procedure were also discussed.  Bard Power PICC patient education guide, fact sheet on infection prevention and patient information card has been provided to patient /or left at bedside.    PICC/Midline Placement Documentation  PICC Single Lumen 10/20/18 PICC Left Brachial 38 cm 0 cm (Active)  Indication for Insertion or Continuance of Line Home intravenous therapies (PICC only) 10/20/18 1200  Exposed Catheter (cm) 0 cm 10/20/18 1200  Site Assessment Clean;Dry;Intact 10/20/18 1200  Line Status Flushed;Blood return noted 10/20/18 1200  Dressing Type Transparent 10/20/18 1200  Dressing Status Clean;Dry;Intact;Antimicrobial disc in place 10/20/18 1200  Dressing Change Due 10/27/18 10/20/18 1200       Audrey Collier 10/20/2018, 12:35 PM

## 2018-10-20 NOTE — Progress Notes (Signed)
CARDIAC REHAB PHASE I   Offered to get pt up to chair. Pt refusing at the moment stating she does not feel good. Tried to encourage pt, pt continues to decline. Practiced IS and Flutter use with pt. Pt only obtaining 250 on IS after much instruction. Pt informed PT is coming around later. Will continue to follow as able and as time allows.  [id number] Rufina Falco, RN BSN 10/20/2018 10:16 AM

## 2018-10-20 NOTE — Progress Notes (Addendum)
Nutrition Follow-up  DOCUMENTATION CODES:   Not applicable  INTERVENTION:   Recommend Cortrak placement tomorrow given prolonged poor intake  Osmolite 1.5 @ 50 ml/hr (1200 ml) 30 ml Prostat BID Provides: 2000 kcals, 105 grams protein, 914 ml free water.   Continue Magic cup TID with meals, each supplement provides 290 kcal and 9 grams of protein  Continue MVI daily   NUTRITION DIAGNOSIS:   Inadequate oral intake related to inability to eat as evidenced by NPO status.  Diet advanced- poor PO  GOAL:   Patient will meet greater than or equal to 90% of their needs  Not meeting  MONITOR:   PO intake, Supplement acceptance, Diet advancement, Labs, Weight trends, I & O's, Skin  REASON FOR ASSESSMENT:   Consult Enteral/tube feeding initiation and management  ASSESSMENT:   58 yo female admitted with fever, vomiting, weakness. Found to have MSSA bacteremia with PNA. Echo showed vegetation. Required transfer to the ICU and intubation on 8/4. PMH includes developmental delay, GERD, anxiety, hypothyroidism, HLD.   8/06 - TEE showing large aortic valve vegetation 8/07 - s/p aortic valve replacement 8/08 - extubated, diet advanced to Dysphagia 2  RD working remotely.  Spoke with RN via phone. Pt's appetite continues to be poor. Had a couple bites of fruit for breakfast. Meal completions charted as 0-25% for her last eight meals. She is refusing Ensure. Recommend placement of Cortrak and initiation of enteral nutrition given prolonged poor PO intake. Paged TCTS to discuss plan, awaiting reply.   Admission weight: 77.7 kg Current weight: 93.9 kg  Generalized +2 edema continues. Pt refusing to work with PT.  I/O: +9,588 ml since 8/6 UOP: 1,250 ml x 24 hrs   Medications: dulcolax, calcium-vit D, colace, ferrous sulfate, MVI with minerals, 20 mEq once  Labs: K 3.2 (L)   Diet Order:   Diet Order            DIET DYS 2 Room service appropriate? Yes; Fluid consistency: Thin   Diet effective now              EDUCATION NEEDS:   No education needs have been identified at this time  Skin:  Skin Assessment: Skin Integrity Issues: Skin Integrity Issues:: DTI, Incisions DTI: right ear Incisions: chest  Last BM:  8/19  Height:   Ht Readings from Last 1 Encounters:  10/07/18 5\' 2"  (1.575 m)    Weight:   Wt Readings from Last 1 Encounters:  10/20/18 93.9 kg    Ideal Body Weight:  56.8 kg  BMI:  Body mass index is 37.86 kg/m.  Estimated Nutritional Needs:   Kcal:  1800-2000  Protein:  100-115 gm  Fluid:  >/= 1.8 L   Mariana Single RD, LDN Clinical Nutrition Pager # [phone]

## 2018-10-21 ENCOUNTER — Inpatient Hospital Stay (HOSPITAL_COMMUNITY): Payer: Medicaid Other

## 2018-10-21 ENCOUNTER — Ambulatory Visit: Payer: Self-pay | Admitting: Thoracic Surgery (Cardiothoracic Vascular Surgery)

## 2018-10-21 LAB — MAGNESIUM
Magnesium: 1.9 mg/dL (ref 1.7–2.4)
Magnesium: 2 mg/dL (ref 1.7–2.4)

## 2018-10-21 LAB — BASIC METABOLIC PANEL
Anion gap: 9 (ref 5–15)
BUN: 19 mg/dL (ref 6–20)
CO2: 23 mmol/L (ref 22–32)
Calcium: 8.2 mg/dL — ABNORMAL LOW (ref 8.9–10.3)
Chloride: 107 mmol/L (ref 98–111)
Creatinine, Ser: 1.56 mg/dL — ABNORMAL HIGH (ref 0.44–1.00)
GFR calc Af Amer: 42 mL/min — ABNORMAL LOW (ref >60)
GFR calc non Af Amer: 37 mL/min — ABNORMAL LOW (ref >60)
Glucose, Bld: 97 mg/dL (ref 70–99)
Potassium: 3.5 mmol/L (ref 3.5–5.1)
Sodium: 139 mmol/L (ref 135–145)

## 2018-10-21 LAB — PHOSPHORUS
Phosphorus: 4.1 mg/dL (ref 2.5–4.6)
Phosphorus: 4.2 mg/dL (ref 2.5–4.6)

## 2018-10-21 MED ORDER — PRO-STAT SUGAR FREE PO LIQD
30.0000 mL | Freq: Two times a day (BID) | ORAL | Status: DC
  Administered 2018-10-21 – 2018-11-06 (×26): 30 mL
  Filled 2018-10-21 (×27): qty 30

## 2018-10-21 MED ORDER — POTASSIUM CHLORIDE CRYS ER 20 MEQ PO TBCR
40.0000 meq | EXTENDED_RELEASE_TABLET | Freq: Once | ORAL | Status: AC
  Administered 2018-10-21: 40 meq via ORAL
  Filled 2018-10-21: qty 2

## 2018-10-21 MED ORDER — OSMOLITE 1.5 CAL PO LIQD
1000.0000 mL | ORAL | Status: DC
  Administered 2018-10-21 – 2018-10-24 (×3): 1000 mL
  Filled 2018-10-21 (×4): qty 1000

## 2018-10-21 NOTE — Progress Notes (Signed)
Occupational Therapy Treatment Patient Details Name: Audrey SevereSheila D Collier MRN: [id number] DOB: March 19, 1960 Today's Date: 10/21/2018    History of present illness 58 yo female presented with fever, vomiting, weakness.  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  Echo showed vegetation.  Transferred to ICU 8/04 due to respiratory failure.  Underwent AVR with TEE intraoprative on 10/07/18. Extubated 10/08/18.   OT comments  Pt progressing toward established goals. Pt currently requires modA for bed mobility in preparation for ADL. Pt completed ADL sitting EOB for about 10 min. Educated pt on importance of mobility to progress back to prior level of functioning. Continued to educate pt on adherence to precautions during functional tasks. Pt will continue to benefit from skilled OT services to maximize safety and independence with ADL/IADL and functional mobility. Will continue to follow acutely and progress as tolerated.      Follow Up Recommendations  SNF;Supervision/Assistance - 24 hour    Equipment Recommendations  3 in 1 bedside commode    Recommendations for Other Services      Precautions / Restrictions Precautions Precautions: Fall;Sternal Precaution Comments: verbally reviewed with patient during functional tasks  Restrictions Weight Bearing Restrictions: Yes Other Position/Activity Restrictions: sternal precautions        Mobility Bed Mobility Overal bed mobility: Needs Assistance Bed Mobility: Sit to Sidelying;Sidelying to Sit;Rolling Rolling: Mod assist Sidelying to sit: Mod assist;HOB elevated     Sit to sidelying: Mod assist;HOB elevated General bed mobility comments: modA for trunk and BLE management;use of bed pad to scoot hips to EOB  Transfers                 General transfer comment: deferred fpr safetu    Balance Overall balance assessment: Needs assistance Sitting-balance support: Feet supported;Single extremity supported Sitting balance-Leahy Scale:  Poor Sitting balance - Comments: reliant on single UE support to maintain balance, intermittent loss of balance able to self correct;sat EOB for about 10 min Postural control: Posterior lean;Left lateral lean                                 ADL either performed or assessed with clinical judgement   ADL Overall ADL's : Needs assistance/impaired Eating/Feeding: Sitting;Set up   Grooming: Wash/dry hands;Wash/dry face;Brushing hair;Moderate assistance Grooming Details (indicate cue type and reason): limited shoulder ROM impacting ability to comb hair;required assistance for completion of task;required assistance to open lotion bottle     Lower Body Bathing: Maximal assistance Lower Body Bathing Details (indicate cue type and reason): applied lotion to bilat LE;assistance to open and squeeze out lotion and apply lotion below knees secondary to sternal precautions         Toilet Transfer: Moderate assistance Toilet Transfer Details (indicate cue type and reason): modA to roll to get off bed pan Toileting- Clothing Manipulation and Hygiene: Total assistance Toileting - Clothing Manipulation Details (indicate cue type and reason): on bed pan upon arrival;total assist for posterior pericare       General ADL Comments: deferred mobility for pt and therapist safety;continued to educate pt on sternal precautions during functioanal takss     Vision       Perception     Praxis      Cognition Arousal/Alertness: Awake/alert Behavior During Therapy: Flat affect Overall Cognitive Status: No family/caregiver present to determine baseline cognitive functioning  General Comments: per chart, history of cognitive deficits (developmental delays) at baseline        Exercises     Shoulder Instructions       General Comments VSS on RA;HR up to 96bpm;SpO2 >92% on RA;educated pt on importance of mobility and elvation for edema  management and progressing back to prior level of functioning    Pertinent Vitals/ Pain       Pain Assessment: No/denies pain Pain Intervention(s): Monitored during session  Home Living                                          Prior Functioning/Environment              Frequency  Min 2X/week        Progress Toward Goals  OT Goals(current goals can now be found in the care plan section)  Progress towards OT goals: Progressing toward goals  Acute Rehab OT Goals Patient Stated Goal: to get home OT Goal Formulation: With patient Time For Goal Achievement: 10/24/18 Potential to Achieve Goals: Good ADL Goals Pt Will Perform Grooming: with supervision;with set-up;sitting Pt Will Perform Lower Body Dressing: with mod assist;sit to/from stand Pt Will Transfer to Toilet: with min assist;with +2 assist;bedside commode Pt/caregiver will Perform Home Exercise Program: Increased ROM;Increased strength;With written HEP provided;Both right and left upper extremity Additional ADL Goal #1: Patient will complete bed mobility with min assist +2, and maintain sitting balance with min guard as precursor to ADLs.  Plan Frequency remains appropriate;Discharge plan needs to be updated    Co-evaluation                 AM-PAC OT "6 Clicks" Daily Activity     Outcome Measure   Help from another person eating meals?: A Little Help from another person taking care of personal grooming?: A Little Help from another person toileting, which includes using toliet, bedpan, or urinal?: A Lot Help from another person bathing (including washing, rinsing, drying)?: A Lot Help from another person to put on and taking off regular upper body clothing?: A Lot Help from another person to put on and taking off regular lower body clothing?: Total 6 Click Score: 13    End of Session    OT Visit Diagnosis: Other abnormalities of gait and mobility (R26.89);Muscle weakness (generalized)  (M62.81);Pain   Activity Tolerance Patient tolerated treatment well   Patient Left in bed;with call bell/phone within reach;with bed alarm set   Nurse Communication Mobility status        Time: 5852-7782 OT Time Calculation (min): 25 min  Charges: OT General Charges $OT Visit: 1 Visit OT Treatments $Self Care/Home Management : 23-37 mins  [place] Office: [place] 10/21/2018, 10:14 AM

## 2018-10-21 NOTE — Procedures (Signed)
Cortrak  Person Inserting Tube:  Audrey Collier, RD Tube Type:  Cortrak - 43 inches Tube Location:  Right nare Initial Placement:  Stomach Secured by: Bridle Technique Used to Measure Tube Placement:  Documented cm marking at nare/ corner of mouth Cortrak Secured At:  61 cm   No x-ray is required. RN may begin using tube.   If the tube becomes dislodged please keep the tube and contact the Cortrak team at [email or web address] (password [email or web address]) for replacement.  If after hours and replacement cannot be delayed, place a NG tube and confirm placement with an abdominal x-ray.    Audrey Collier RD, LDN Clinical Nutrition Pager # [phone]

## 2018-10-21 NOTE — Progress Notes (Signed)
Cortrak placed and orders received to start osmolite continuous feed.  While awaiting feed pump, at 1315 pt ate a whole magic cup with a scheduled pill  - reportedly the most she has eaten in several days, and definitely so far today.  Around 1400 she unfortunately vomitted seemingly the whole magic cup.  MD made aware, plans for KUB, feedings not started pending results and further orders.

## 2018-10-21 NOTE — Progress Notes (Signed)
Nutrition Follow-up  DOCUMENTATION CODES:   Not applicable  INTERVENTION:   Initiate Osmolite 1.5 @ 20 ml/hr and increase by 10 ml every 8 hours to goal rate of 50 ml/hr (1200 ml)  30 ml Prostat BID  Provides: 2000 kcals, 105 grams protein, 914 ml free water.  Monitor magnesium and phosphorus every 12 hours for 4 occurrences, MD to replete as needed, as pt is at risk for refeeding syndrome given poor intake since 8/8.   Continue Magic cup TID with meals, each supplement provides 290 kcal and 9 grams of protein  Continue MVI daily    NUTRITION DIAGNOSIS:   Inadequate oral intake related to inability to eat as evidenced by NPO status.  Diet advanced- poor PO  GOAL:   Patient will meet greater than or equal to 90% of their needs  Not meeting  MONITOR:   PO intake, Supplement acceptance, Diet advancement, Labs, Weight trends, I & O's, Skin  REASON FOR ASSESSMENT:   Consult Enteral/tube feeding initiation and management  ASSESSMENT:   58 yo female admitted with fever, vomiting, weakness. Found to have MSSA bacteremia with PNA. Echo showed vegetation. Required transfer to the ICU and intubation on 8/4. PMH includes developmental delay, GERD, anxiety, hypothyroidism, HLD.   8/06 - TEE showing large aortic valve vegetation 8/07 - s/p aortic valve replacement 8/08 - extubated, diet advanced to Dysphagia 2 8/21 - Cortrak placement (tip in stomach); spoke with TCTS, ok to start TF  Admission weight: 77.7 kg Current weight: 91.8 kg  Generalized +2 edema continues. Pt requires encouragement to work with PT. Therapy recommends SNF.   I/O: +8815 ml since 8/6 UOP: 200 ml x 24 hrs   Medications: dulcolax, calcium-vit D, colace, ferrous sulfate, MVI with minerals, 20 mEq KCl daily   Labs reviewed  Diet Order:   Diet Order            DIET DYS 2 Room service appropriate? Yes; Fluid consistency: Thin  Diet effective now              EDUCATION NEEDS:   No education  needs have been identified at this time  Skin:  Skin Assessment: Skin Integrity Issues: Skin Integrity Issues:: DTI, Incisions DTI: right ear Incisions: chest  Last BM:  8/19  Height:   Ht Readings from Last 1 Encounters:  10/07/18 5\' 2"  (1.575 m)    Weight:   Wt Readings from Last 1 Encounters:  10/21/18 91.8 kg    Ideal Body Weight:  56.8 kg  BMI:  Body mass index is 37.02 kg/m.  Estimated Nutritional Needs:   Kcal:  1800-2000  Protein:  100-115 gm  Fluid:  >/= 1.8 L  Maylon Peppers RD, LDN, CNSC [phone] Pager [phone] After Hours Pager

## 2018-10-21 NOTE — Progress Notes (Addendum)
      [address]       [place] [address]             [phone]        14 Days Post-Op Procedure(s) (LRB): AORTIC VALVE REPLACEMENT (AVR) (N/A) TRANSESOPHAGEAL ECHOCARDIOGRAM (TEE) (N/A)  Subjective: Patient just waking up this am. She is sad because she misses her aunt and her home. She is not eating much.  Objective: Vital signs in last 24 hours: Temp:  [97.7 F (36.5 C)-98.4 F (36.9 C)] 98 F (36.7 C) (08/21 0500) Pulse Rate:  [71-75] 71 (08/21 0500) Cardiac Rhythm: Normal sinus rhythm (08/20 2000) Resp:  [18-21] 20 (08/21 0500) BP: (107-132)/(73-89) 107/89 (08/21 0500) SpO2:  [95 %-98 %] 96 % (08/21 0500) Weight:  [91.8 kg] 91.8 kg (08/21 0500)  Pre op weight 83.9 kg Current Weight  10/21/18 91.8 kg      Intake/Output from previous day: 08/20 0701 - 08/21 0700 In: 0  Out: 200 [Urine:200]   Physical Exam:  Cardiovascular: RRR Pulmonary: Mostly clear Abdomen: Soft, non tender, bowel sounds present. Extremities: + UE/LE edema Wound: Clean and dry.  No erythema or signs of infection.  Lab Results: CBC: Recent Labs    10/19/18 0240  WBC 10.6*  HGB 8.3*  HCT 27.0*  PLT 346   BMET:  Recent Labs    10/19/18 0240 10/21/18 0228  NA 140 139  K 3.2* 3.5  CL 107 107  CO2 23 23  GLUCOSE 93 97  BUN 22* 19  CREATININE 1.68* 1.56*  CALCIUM 8.3* 8.2*    PT/INR:  Lab Results  Component Value Date   INR 1.7 (H) 10/07/2018   INR 1.4 (H) 10/01/2018   ABG:  INR: Will add last result for INR, ABG once components are confirmed Will add last 4 CBG results once components are confirmed  Assessment/Plan:  1. CV - SR in the 70's. On Lopressor 25 mg bid. No ACE/ARB secondary to elevated creatinine. 2.  Pulmonary - On room air this am.   Encourage incentive spirometer 3. AKI- creatinine this am stable at 1.56. Creatinine upon admission 1.7. She is volume overloaded and has not responded well to Lasix.  Continue Zaroxolyn 5 mg daily.  4. Expected acute blood loss anemia -  Last H and H decreased to 8.3 and 27. Occult blood negative. Continue oral ferrous 5. Mild thrombocytopenia-last platelets increased to 136,000 6. ID-Last WBC decreased to 10,600. On Cefazolin 2 g bid and Rifampin 300 mg tid for MSSA. Per infectious disease, will need a total of 6 weeks (last dose 09/17). PICC line to be placed 7. Hypothyroidism-on Levothyroxine 125 mcg daily 8. GI-per nutritional management, po intake inadequate. Recommend Cortrak, MVI, and on Dysphagia II diet 9. Supplement potassium 10. Deconditioning-continue PT/OT. Will need SNF bed no "bed offers" yet  Audrey Collier M ZimmermanPA-C 10/21/2018,7:08 AM [phone]

## 2018-10-22 LAB — GLUCOSE, CAPILLARY
Glucose-Capillary: 104 mg/dL — ABNORMAL HIGH (ref 70–99)
Glucose-Capillary: 104 mg/dL — ABNORMAL HIGH (ref 70–99)
Glucose-Capillary: 109 mg/dL — ABNORMAL HIGH (ref 70–99)
Glucose-Capillary: 114 mg/dL — ABNORMAL HIGH (ref 70–99)
Glucose-Capillary: 123 mg/dL — ABNORMAL HIGH (ref 70–99)
Glucose-Capillary: 93 mg/dL (ref 70–99)

## 2018-10-22 LAB — CREATININE, SERUM
Creatinine, Ser: 1.56 mg/dL — ABNORMAL HIGH (ref 0.44–1.00)
GFR calc Af Amer: 42 mL/min — ABNORMAL LOW (ref >60)
GFR calc non Af Amer: 37 mL/min — ABNORMAL LOW (ref >60)

## 2018-10-22 LAB — MAGNESIUM
Magnesium: 1.9 mg/dL (ref 1.7–2.4)
Magnesium: 2.1 mg/dL (ref 1.7–2.4)

## 2018-10-22 LAB — PHOSPHORUS
Phosphorus: 4.5 mg/dL (ref 2.5–4.6)
Phosphorus: 4.2 mg/dL (ref 2.5–4.6)

## 2018-10-22 NOTE — Progress Notes (Signed)
Pt requested that Audrey Collier be contact person for her. Dayshift RN had gave update in the morning. [name] called and was concerned that she did not get call or update. I explained that patient does not have HCPOA or legal guardian and is able to tell us who she would like updates given. Ardine Eng was called and is aware of situation. I asked that family consider addressing the need for documentation in the event patient not able to make decisions. Pt resting with call bell within reach.  Will continue to monitor.

## 2018-10-22 NOTE — Progress Notes (Signed)
Pt has voided my entire shift bladder scan showed 848 pt denied need to void. In and out cath performed By nanette carr and [place] per protocol 900 cc's amber cloudy urine returned

## 2018-10-22 NOTE — Progress Notes (Addendum)
[address]       [place] [address]             [phone]      15 Days Post-Op Procedure(s) (LRB): AORTIC VALVE REPLACEMENT (AVR) (N/A) TRANSESOPHAGEAL ECHOCARDIOGRAM (TEE) (N/A) Subjective: Sad and depressed affect this morning. Nods to my questions.   Objective: Vital signs in last 24 hours: Temp:  [97.6 F (36.4 C)-98.6 F (37 C)] 97.7 F (36.5 C) (08/22 0800) Pulse Rate:  [66-82] 82 (08/22 0800) Cardiac Rhythm: Normal sinus rhythm (08/22 0701) Resp:  [18-20] 18 (08/22 0800) BP: (114-135)/(71-79) 135/77 (08/22 0800) SpO2:  [94 %-97 %] 94 % (08/22 0800) Weight:  [91.4 kg] 91.4 kg (08/22 0351)     Intake/Output from previous day: 08/21 0701 - 08/22 0700 In: 579 [P.O.:360; I.V.:20; NG/GT:199] Out: 930 [Urine:900; Emesis/NG output:30] Intake/Output this shift: Total I/O In: 94 [I.V.:20; NG/GT:30] Out: -   General appearance: cooperative and no distress Heart: regular rate and rhythm, S1, S2 normal, no murmur, click, rub or gallop Lungs: clear to auscultation bilaterally Abdomen: soft, non-tender; bowel sounds normal; no masses,  no organomegaly Extremities: edema in upper and lower extremities Wound: well healing sternotomy incision  Lab Results: No results for input(s): WBC, HGB, HCT, PLT in the last 72 hours. BMET:  Recent Labs    10/21/18 0228 10/22/18 0329  NA 139  --   K 3.5  --   CL 107  --   CO2 23  --   GLUCOSE 97  --   BUN 19  --   CREATININE 1.56* 1.56*  CALCIUM 8.2*  --     PT/INR: No results for input(s): LABPROT, INR in the last 72 hours. ABG    Component Value Date/Time   PHART 7.430 10/08/2018 0920   HCO3 23.6 10/08/2018 0920   TCO2 25 10/08/2018 0920   ACIDBASEDEF 6.0 (H) 10/08/2018 0307   O2SAT 94.0 10/08/2018 0920   CBG (last 3)  Recent Labs    10/22/18 0006 10/22/18 0355 10/22/18 0803  GLUCAP 104* 104* 114*    Assessment/Plan: S/P Procedure(s) (LRB): AORTIC VALVE REPLACEMENT (AVR) (N/A)  TRANSESOPHAGEAL ECHOCARDIOGRAM (TEE) (N/A)   1. CV - SR in the 70's. On Lopressor 25 mg bid. No ACE/ARB secondary to elevated creatinine. Creatine 1.56 today.  2.  Pulmonary - On room air this am.   Encourage incentive spirometer 3. AKI- creatinine this am stable at 1.56. Creatinine upon admission 1.7. She is volume overloaded and has not responded well to Lasix.  Continue Zaroxolyn 5 mg daily. 4. Expected acute blood loss anemia -  Last H and H decreased to 8.3 and 27. No new labs. Occult blood negative. Continue oral ferrous 5. ID-Last WBC decreased to 10,600. On Cefazolin 2 g bid and Rifampin 300 mg tid for MSSA. Per infectious disease, will need a total of 6 weeks (last dose 09/17). PICC line to be placed 6. Hypothyroidism-on Levothyroxine 125 mcg daily 7. GI-per nutritional management, po intake inadequate. Recommend Cortrak, MVI, and on Dysphagia II diet. Not wanting to eat-seems depressed.  8. Deconditioning-continue PT/OT. Will need SNF bed no "bed offers" yet.  Plan: Will need SNF. Continue supportive treatment.    LOS: 21 days    Audrey Collier 10/22/2018  Patient not talkative today May need PEG before SNF will take if requires feeding tube. I have seen and examined Audrey Collier and agree with the above assessment  and plan.  Grace Isaac  MD Beeper [phone] Office [phone] 10/22/2018 1:45 PM

## 2018-10-22 NOTE — Progress Notes (Deleted)
Per Nicholes Rough PA okay to wait and do MRI per Dr. Cyndia Bent.

## 2018-10-23 LAB — GLUCOSE, CAPILLARY
Glucose-Capillary: 122 mg/dL — ABNORMAL HIGH (ref 70–99)
Glucose-Capillary: 124 mg/dL — ABNORMAL HIGH (ref 70–99)
Glucose-Capillary: 125 mg/dL — ABNORMAL HIGH (ref 70–99)
Glucose-Capillary: 127 mg/dL — ABNORMAL HIGH (ref 70–99)
Glucose-Capillary: 133 mg/dL — ABNORMAL HIGH (ref 70–99)
Glucose-Capillary: 141 mg/dL — ABNORMAL HIGH (ref 70–99)
Glucose-Capillary: 93 mg/dL (ref 70–99)

## 2018-10-23 MED ORDER — TAMSULOSIN HCL 0.4 MG PO CAPS
0.4000 mg | ORAL_CAPSULE | Freq: Every day | ORAL | Status: DC
  Administered 2018-10-23 – 2018-12-06 (×41): 0.4 mg via ORAL
  Filled 2018-10-23 (×42): qty 1

## 2018-10-23 NOTE — Plan of Care (Signed)
Poc progressing.  

## 2018-10-23 NOTE — Progress Notes (Signed)
Pt didn't void all night long. Bladder scan showed more than 830 cc. In out cath completed per shree Gururng RN and  Kendal NT , 930 cc amber colored urine returned. Will continue to monitor.

## 2018-10-23 NOTE — Progress Notes (Addendum)
[address]       [place] [address]             [phone]      16 Days Post-Op Procedure(s) (LRB): AORTIC VALVE REPLACEMENT (AVR) (N/A) TRANSESOPHAGEAL ECHOCARDIOGRAM (TEE) (N/A) Subjective: Flat affect this morning. She does not want to eat and feels nauseous. She states she has a bout of diarrhea last night which was mostly water.   Objective: Vital signs in last 24 hours: Temp:  [98 F (36.7 C)-98.3 F (36.8 C)] 98.2 F (36.8 C) (08/23 0813) Pulse Rate:  [76-84] 83 (08/23 0813) Cardiac Rhythm: Normal sinus rhythm (08/23 0415) Resp:  [12-24] 18 (08/23 0813) BP: (116-141)/(65-83) 122/73 (08/23 0813) SpO2:  [95 %-98 %] 96 % (08/23 0813) Weight:  [89.6 kg] 89.6 kg (08/23 0417)     Intake/Output from previous day: 08/22 0701 - 08/23 0700 In: 1390 [P.O.:100; I.V.:370; NG/GT:620; IV Piggyback:300] Out: 1230 [Urine:1230] Intake/Output this shift: No intake/output data recorded.  General appearance: alert, cooperative and no distress Heart: regular rate and rhythm, S1, S2 normal, no murmur, click, rub or gallop Lungs: clear to auscultation bilaterally Abdomen: soft, non-tender; bowel sounds normal; no masses,  no organomegaly Extremities: extremities normal, atraumatic, no cyanosis or edema Wound: clean and dry  Lab Results: No results for input(s): WBC, HGB, HCT, PLT in the last 72 hours. BMET:  Recent Labs    10/21/18 0228 10/22/18 0329  NA 139  --   K 3.5  --   CL 107  --   CO2 23  --   GLUCOSE 97  --   BUN 19  --   CREATININE 1.56* 1.56*  CALCIUM 8.2*  --     PT/INR: No results for input(s): LABPROT, INR in the last 72 hours. ABG    Component Value Date/Time   PHART 7.430 10/08/2018 0920   HCO3 23.6 10/08/2018 0920   TCO2 25 10/08/2018 0920   ACIDBASEDEF 6.0 (H) 10/08/2018 0307   O2SAT 94.0 10/08/2018 0920   CBG (last 3)  Recent Labs    10/23/18 0003 10/23/18 0359 10/23/18 0834  GLUCAP 125* 141* 133*     Assessment/Plan: S/P Procedure(s) (LRB): AORTIC VALVE REPLACEMENT (AVR) (N/A) TRANSESOPHAGEAL ECHOCARDIOGRAM (TEE) (N/A)  1. CV - SR in the 80's. On Lopressor 25 mg bid. No ACE/ARB secondary to elevated creatinine. Creatine 1.56 today.  2. Pulmonary - On room air this am. Encourage incentive spirometer 3. AKI- creatininethis amstable at 1.56. Creatinine upon admission 1.7. She is volume overloaded and has not responded well to Lasix.ContinueZaroxolyn 5 mg daily. 4. Expected acute blood loss anemia - Last H and H decreased to 8.3 and 27. No new labs. Occult blood negative. Continue oral ferrous 5. ID-Last WBC decreased to 10,600. On Cefazolin 2 g bid and Rifampin 300 mg tid for MSSA. Per infectious disease, will need a total of 6 weeks (last dose 09/17). PICC line to be placed 6. Hypothyroidism-on Levothyroxine 125 mcg daily 7. GI-per nutritional management, po intake inadequate. Supplementing with osmolite and pro-stat and on Dysphagia II diet. Not wanting to eat-seems depressed. Zofran for nausea.  8. Deconditioning-continue PT/OT.Will need SNF bed no "bed offers" yet.  Plan: Will need SNF. Continue supportive treatment. She was in-and-out cathed two nights in a row. May need a foley or PureWick. May also need a PEG before SNF admission.    LOS: 22 days    Elgie Collard 10/23/2018  very flat affect, does not talk  developed urinary retention, 800 ml residual urine  My need feeding tube  for SNF I have seen and examined Nelda SevereSheila D Coral and agree with the above assessment  and plan.  Delight OvensEdward B Ariani Seier MD [facility] [phone] Office [phone] 10/23/2018 1:10 PM

## 2018-10-24 LAB — CBC
HCT: 24.4 % — ABNORMAL LOW (ref 36.0–46.0)
Hemoglobin: 7.4 g/dL — ABNORMAL LOW (ref 12.0–15.0)
MCH: 29 pg (ref 26.0–34.0)
MCHC: 30.3 g/dL (ref 30.0–36.0)
MCV: 95.7 fL (ref 80.0–100.0)
Platelets: 224 10*3/uL (ref 150–400)
RBC: 2.55 MIL/uL — ABNORMAL LOW (ref 3.87–5.11)
RDW: 18 % — ABNORMAL HIGH (ref 11.5–15.5)
WBC: 7.3 10*3/uL (ref 4.0–10.5)
nRBC: 0 % (ref 0.0–0.2)

## 2018-10-24 LAB — GLUCOSE, CAPILLARY
Glucose-Capillary: 116 mg/dL — ABNORMAL HIGH (ref 70–99)
Glucose-Capillary: 132 mg/dL — ABNORMAL HIGH (ref 70–99)
Glucose-Capillary: 99 mg/dL (ref 70–99)
Glucose-Capillary: 101 mg/dL — ABNORMAL HIGH (ref 70–99)
Glucose-Capillary: 106 mg/dL — ABNORMAL HIGH (ref 70–99)

## 2018-10-24 LAB — BASIC METABOLIC PANEL
Anion gap: 8 (ref 5–15)
BUN: 34 mg/dL — ABNORMAL HIGH (ref 6–20)
CO2: 27 mmol/L (ref 22–32)
Calcium: 8.1 mg/dL — ABNORMAL LOW (ref 8.9–10.3)
Chloride: 104 mmol/L (ref 98–111)
Creatinine, Ser: 1.56 mg/dL — ABNORMAL HIGH (ref 0.44–1.00)
GFR calc Af Amer: 42 mL/min — ABNORMAL LOW (ref >60)
GFR calc non Af Amer: 37 mL/min — ABNORMAL LOW (ref >60)
Glucose, Bld: 118 mg/dL — ABNORMAL HIGH (ref 70–99)
Potassium: 4.4 mmol/L (ref 3.5–5.1)
Sodium: 139 mmol/L (ref 135–145)

## 2018-10-24 MED ORDER — OSMOLITE 1.5 CAL PO LIQD
1000.0000 mL | ORAL | Status: DC
  Administered 2018-10-24: 1000 mL
  Filled 2018-10-24 (×2): qty 1000

## 2018-10-24 MED ORDER — OSMOLITE 1.5 CAL PO LIQD
1000.0000 mL | Freq: Every day | ORAL | Status: DC
  Filled 2018-10-24: qty 1000

## 2018-10-24 NOTE — Progress Notes (Addendum)
      [address]       [place] [address]             [phone]        17 Days Post-Op Procedure(s) (LRB): AORTIC VALVE REPLACEMENT (AVR) (N/A) TRANSESOPHAGEAL ECHOCARDIOGRAM (TEE) (N/A)  Subjective: Patient when asked is she has nausea she stated "yes" and if her belly hurts she said "yes, a little all over".   Objective: Vital signs in last 24 hours: Temp:  [97.9 F (36.6 C)-98.8 F (37.1 C)] 98.7 F (37.1 C) (08/24 0409) Pulse Rate:  [83-90] 88 (08/24 0409) Cardiac Rhythm: Normal sinus rhythm (08/24 0409) Resp:  [18-22] 22 (08/24 0409) BP: (112-127)/(62-73) 122/71 (08/24 0409) SpO2:  [93 %-96 %] 95 % (08/24 0409) Weight:  [90.7 kg] 90.7 kg (08/24 0410)  Pre op weight 83.9 kg Current Weight  10/24/18 90.7 kg      Intake/Output from previous day: 08/23 0701 - 08/24 0700 In: 1030 [P.O.:170; I.V.:100; NG/GT:560; IV Piggyback:200] Out: -    Physical Exam:  Cardiovascular: RRR Pulmonary: Mostly clear Abdomen: Soft, does not grimace when palpated, bowel sounds present. Extremities: Mild UE/LE edema Wound: Clean and dry.  No erythema or signs of infection.  Lab Results: CBC: Recent Labs    10/24/18 0406  WBC 7.3  HGB 7.4*  HCT 24.4*  PLT 224   BMET:  Recent Labs    10/22/18 0329 10/24/18 0406  NA  --  139  K  --  4.4  CL  --  104  CO2  --  27  GLUCOSE  --  118*  BUN  --  34*  CREATININE 1.56* 1.56*  CALCIUM  --  8.1*    PT/INR:  Lab Results  Component Value Date   INR 1.7 (H) 10/07/2018   INR 1.4 (H) 10/01/2018   ABG:  INR: Will add last result for INR, ABG once components are confirmed Will add last 4 CBG results once components are confirmed  Assessment/Plan:  1. CV - SR in the 70's. On Lopressor 25 mg bid. No ACE/ARB secondary to elevated creatinine. 2.  Pulmonary - On room air this am.   Encourage incentive spirometer 3. AKI- creatinine this am stable at 1.56. Creatinine upon admission 1.7. She is volume  overloaded and did not respond well to Lasix.  Continue Zaroxolyn 5 mg daily. 4. Expected acute blood loss anemia -  H and H this am decreased to 7.4 and 24.4. Occult blood negative. Continue oral ferrous 5. Mild thrombocytopenia resolved as platelets up to 224,000 6. ID-WBC decreased to 7,300. On Cefazolin 2 g bid and Rifampin 300 mg tid for MSSA. Per infectious disease, will need a total of 6 weeks (last dose 09/17). Has PICC line 7. Hypothyroidism-on Levothyroxine 125 mcg daily 8. GI-per nutritional management, po intake inadequate. Has Cortrak, TFs at 50 ml/hour continuously. Per Dr. Kipp Brood, will change to nightly feedings. Will do calorie count. Also, on MVI, and on Dysphagia II diet. Nutrition to provide recommendations. KUB on 08/21 showed nonspecific bowel gas pattern with a few mildly dilated loops of small bowel scattered throughout the abdomen. Hope to avoid PEG 9. Urinary retention-on Flomax 0.4 mg daily. Bladder scan Q 8 hours and if > 200 will either to continue in and out cath vs foley (had in and out cath 08/23) 10. Deconditioning-continue PT/OT. Would benefit from SNF bed no "bed offers" yet  Sharalyn Ink ZimmermanPA-C 10/24/2018,7:47 AM [phone]

## 2018-10-24 NOTE — Progress Notes (Addendum)
Nutrition Follow-up  DOCUMENTATION CODES:   Not applicable  INTERVENTION:    Continue Calorie Count  Continue Magic cup TID with meals, each supplement provides 290 kcal and 9 grams of protein  Add snacks BID   Change tube feeding:  -Osmolite 1.5 @ 50 ml/hr via Cortrak x 10 hours (8pm-6am) -30 ml Prostat BID  Provides: 950 kcals, 61 grams protein, 381 ml free water. Meets 53% kcal and 61% of protein needs.   NUTRITION DIAGNOSIS:   Inadequate oral intake related to poor appetite as evidenced by meal completion < 50%.  Ongoing  GOAL:   Patient will meet greater than or equal to 90% of their needs  Not meeting with PO alone  MONITOR:   PO intake, Supplement acceptance, Diet advancement, Labs, Weight trends, I & O's, Skin  REASON FOR ASSESSMENT:   Consult Enteral/tube feeding initiation and management  ASSESSMENT:   58 yo female admitted with fever, vomiting, weakness. Found to have MSSA bacteremia with PNA. Echo showed vegetation. Required transfer to the ICU and intubation on 8/4. PMH includes developmental delay, GERD, anxiety, hypothyroidism, HLD.  8/06 - TEE showing large aortic valve vegetation 8/07 - s/p aortic valve replacement 8/08 - extubated, diet advanced to Dysphagia 2 8/21 - Cortrak placement (tip in stomach); spoke with TCTS, ok to start TF  Spoke with Aunt and pt at bedside. Pt complaining of slight abdominal pain this am. Appetite continues to fluctuate. Meal completions charted as 0-25% for pt's last 8 meals. RD observed lunch tray 50% completed and Magic Cup 100% at bedside. Pt seems to eat more when Aunt assists.Tube feeding changed to nocturnal per TCTS. RD to make adjustments to meet 50% of needs. RD to order snacks per pt preferences.   Calorie count started this am. Will collect tickets tomorrow for Day 1 results.   Addendum: Aunt complains pt is having issue with sore throat and she afraid pt may aspirate. RN made aware.   Admission  weight: 77.7 kg Current weight: 90.7 kg (+2 edema BUE and +3 edema BLE)  I/O: +1,794 ml since 8/10 UOP: 300 ml x 24 hrs   Medications: dulcolax, calcium-vit D, colace, ferrous sulfate, MVI with minerals, 20 mEq KCl daily Labs: CBG 93-132  Diet Order:   Diet Order            DIET DYS 2 Room service appropriate? Yes; Fluid consistency: Thin  Diet effective now              EDUCATION NEEDS:   No education needs have been identified at this time  Skin:  Skin Assessment: Skin Integrity Issues: Skin Integrity Issues:: DTI, Incisions DTI: right ear Incisions: chest  Last BM:  8/23  Height:   Ht Readings from Last 1 Encounters:  10/07/18 5\' 2"  (1.575 m)    Weight:   Wt Readings from Last 1 Encounters:  10/24/18 90.7 kg    Ideal Body Weight:  56.8 kg  BMI:  Body mass index is 36.57 kg/m.  Estimated Nutritional Needs:   Kcal:  1800-2000  Protein:  100-115 gm  Fluid:  >/= 1.8 L   Mariana Single RD, LDN Clinical Nutrition Pager # [phone]

## 2018-10-24 NOTE — Consult Note (Addendum)
[facility] Nurse wound follow-up consult note Reason for Consult:  Consult requested for right ear. Wound type: previously noted  Deep tissue pressure injury has resolved and is healed with intact skin, no open wound requiring topical treatment.  Please re-consult if further assistance is needed.  Thank-you,  Julien Girt MSN, [place], [place], [place], [place]

## 2018-10-24 NOTE — Plan of Care (Signed)
  Problem: Health Behavior/Discharge Planning: Goal: Ability to manage health-related needs will improve Outcome: Progressing   Problem: Clinical Measurements: Goal: Ability to maintain clinical measurements within normal limits will improve Outcome: Progressing   Problem: Activity: Goal: Risk for activity intolerance will decrease Outcome: Not Progressing   Problem: Nutrition: Goal: Adequate nutrition will be maintained Outcome: Progressing

## 2018-10-24 NOTE — Discharge Instructions (Signed)
Coccidioidomycosis Coccidioidomycosis is an infection caused by breathing in the spores of a certain type of fungus. Spores are very small cells that fungi release to grow more fungi. The fungus that causes this infection lives in the soil of regions that have a dry and windy climate. These regions include the [place], [place], [address] [place], and parts of [place], [place], and [place]. The infection is usually mild and goes away without treatment (primary coccidioidomycosis). In some cases, the infection may be severe. It can cause lung damage and spread to the brain, spinal cord, skin, bones, or joints (progressive coccidioidomycosis). Coccidioidomycosis does not spread from person to person (is not contagious). What are the causes? This condition is caused by spores of a fungus called Coccidioides immitis. You can breathe in these spores when they are picked up by the wind. In your lungs, these spores may grow, break open, and release tiny spores (endospores) that can spread through your body. You cannot see spores or endospores unless you use a microscope. It is very common to be exposed to the spores of the fungus, but it is not common to develop an infection from exposure. What increases the risk? You are more likely to develop this condition if you:  Live in or travel to areas where the fungus is common.  Are 60 or older.  Work with livestock.  Are pregnant.  Have diabetes.  Are of African American, [place], or American [place] descent. A progressive infection is more likely to develop in people who have a weakened disease-fighting system (immune system). This may include people who:  Have HIV or AIDS.  Have had an organ transplant.  Take certain medicines, such as steroids or cancer drugs. What are the signs or symptoms? People with a primary infection often have no symptoms. If you do have symptoms, they may develop 1-3  weeks after breathing in the fungal spores. Symptoms of a primary infection may include:  Headache.  Cough.  Fever.  Chills.  Shortness of breath.  Night sweats.  Muscle aches. Symptoms of a progressive infection may develop months or years after a primary infection, and may include:  Swollen and painful joints.  A skin infection that drains fluid.  Headache and stiff neck.  Confusion.  Double vision.  Clumsiness. How is this diagnosed? This condition may be diagnosed based on:  Your symptoms, travel history, and medical history.  A physical exam.  Blood tests.  Removing a tissue sample to examine under a microscope (biopsy) or to culture in a lab.  Imaging tests such as a chest X-ray or a CT scan. How is this treated? Treatment for this condition depends on how severe your symptoms are.  For a primary infection, most people do not need treatment. If you have serious symptoms or if you are at risk for a progressive infection, you may be given antifungal medicine to take by mouth (oral medicine).  For a progressive infection, you will be treated with antifungal medicine. You will likely need to take this medicine for longer than 6 months. In some cases, you may need to be treated with IV antifungal medicine at a hospital. After the hospital stay, you will need to take oral antifungal medicine at home. If you have a weakened immune system, you may need to take this medicine for the rest of your life. Follow these instructions at home:  Rest at home until your fever goes away.  Return to your normal activities as  told by your health care provider. Ask your health care provider what activities are safe for you.  Take over-the-counter and prescription medicines only as told by your health care provider.  If you were prescribed an antifungal medicine, take it as told by your health care provider. Do not stop taking the antifungal even if you start to feel  better.  Drink enough fluid to keep your urine pale yellow.  Keep all follow-up visits as told by your health care provider. This is important. Contact a health care provider if you:  Notice that your symptoms do not improve with treatment.  Develop symptoms of progressive coccidioidomycosis. Get help right away if you:  Have chest pain.  Have trouble breathing.  Cough up blood.  Develop a headache and a stiff neck.  Have confusion. Summary  Coccidioidomycosis is an infection caused by breathing in the spores of a certain type of fungus.  The fungus that causes this infectionlives in the soil of regions that have a dry and windy climate. You can breathe in these spores when they are picked up by the wind.  The infection is usually mild and goes away without treatment. However, it sometimes is severe and can cause lung damage and spread to the brain, spinal cord, skin, bones, or joints.  It is very common to be exposed to the spores of the fungus, but it is not common to develop an infection from exposure. This information is not intended to replace advice given to you by your health care provider. Make sure you discuss any questions you have with your health care provider. Document Released: 02/06/2002 Document Revised: 06/08/2018 Document Reviewed: 01/05/2017 Elsevier Patient Education  [place]. We ask the SNF to please do the following: 1. Please obtain vital signs at least one time daily 2.Please weigh the patient daily. If he or she continues to gain weight or develops lower extremity edema, contact the office at [phone]. 3. Ambulate patient at least three times daily and please use sternal precautions.   Discharge Instructions:  1. You may shower, please wash incisions daily with soap and water and keep dry.  If you wish to cover wounds with dressing you may do so but please keep clean and change daily.  No tub baths or swimming until incisions have  completely healed.  If your incisions become red or develop any drainage please call our office at [phone]  2. No Driving until cleared by Dr. Abran Duke office and you are no longer using narcotic pain medications  3. Monitor your weight daily.. Please use the same scale and weigh at same time... If you gain 5-10 lbs in 48 hours with associated lower extremity swelling, please contact our office at [phone]  4. Fever of 101.5 for at least 24 hours with no source, please contact our office at [phone]  5. Activity- up as tolerated, please walk at least 3 times per day.  Avoid strenuous activity, no lifting, pushing, or pulling with your arms over 8-10 lbs for a minimum of 6 weeks  6. If any questions or concerns arise, please do not hesitate to contact our office at [phone]

## 2018-10-24 NOTE — Progress Notes (Signed)
Pt had voided in bed. Post void bladder scan 211 cc. Will continue to monitor.

## 2018-10-25 ENCOUNTER — Ambulatory Visit: Payer: Medicaid Other | Admitting: Cardiology

## 2018-10-25 LAB — GLUCOSE, CAPILLARY
Glucose-Capillary: 100 mg/dL — ABNORMAL HIGH (ref 70–99)
Glucose-Capillary: 118 mg/dL — ABNORMAL HIGH (ref 70–99)
Glucose-Capillary: 118 mg/dL — ABNORMAL HIGH (ref 70–99)
Glucose-Capillary: 124 mg/dL — ABNORMAL HIGH (ref 70–99)
Glucose-Capillary: 128 mg/dL — ABNORMAL HIGH (ref 70–99)
Glucose-Capillary: 88 mg/dL (ref 70–99)
Glucose-Capillary: 97 mg/dL (ref 70–99)

## 2018-10-25 MED ORDER — OSMOLITE 1.5 CAL PO LIQD
1000.0000 mL | ORAL | Status: DC
  Administered 2018-10-25 – 2018-10-26 (×2): 1000 mL
  Filled 2018-10-25 (×4): qty 1000

## 2018-10-25 NOTE — Progress Notes (Signed)
Nutrition Follow-up  DOCUMENTATION CODES:   Not applicable  INTERVENTION:    Continue Calorie Count  Continue Magic cup TID with meals, each supplement provides 290 kcal and 9 grams of protein  Add snacks BID   Decrease tube feeding:  -Osmolite 1.5 @ 45 ml/hr via Cortrak x 10 hours (8pm-6am) -30 ml Prostat BID  Provides: 650 kcals, 58 grams protein, 343 ml free water. Meets 36% kcal and 58% of protein needs.   NUTRITION DIAGNOSIS:   Inadequate oral intake related to poor appetite as evidenced by meal completion < 50%.  Ongoing  GOAL:   Patient will meet greater than or equal to 90% of their needs  Not meeting PO- progressing slightly   MONITOR:   PO intake, Supplement acceptance, Diet advancement, Labs, Weight trends, I & O's, Skin  REASON FOR ASSESSMENT:   Consult Enteral/tube feeding initiation and management  ASSESSMENT:   58 yo female admitted with fever, vomiting, weakness. Found to have MSSA bacteremia with PNA. Echo showed vegetation. Required transfer to the ICU and intubation on 8/4. PMH includes developmental delay, GERD, anxiety, hypothyroidism, HLD.   8/06 - TEE showing large aortic valve vegetation 8/07 - s/p aortic valve replacement 8/08 - extubated, diet advanced to Dysphagia 2 8/21 - Cortrak placement(tip in stomach); spoke with TCTS, ok to start TF  Day 1 results (8/24) Breakfast: 50 kcal, 0 g protein Lunch: 611 kcal, 10 g protein Dinner: 187 kcal, 16 g protein Supplements: 290 kcal, 9 g protein  Total intake: 1138 kcal (63% of minimum estimated needs)  35 protein (35% of minimum estimated needs)  Pt complaining of abdominal pain this am with aunt at bedside. Per RN pt did not complain of this earlier. When asked if pt feels nauseous, feels full, and if her abdomen hurts, she nods yes. Unsure of validity. Pt seems to be tolerating nocturnal TF at current rate. Abdomen soft to touch. Will slightly decrease TF. Do not feel as if intake is  enough to pull tube. Will monitor for Day 2 results.   Admission weight: 77.7 kg Current weight: 92.2 kg (+2 edema BUE and +3 edema BLE)  I/O: +1,176 ml since 8/11 UOP: 1,750 ml x 24 hrs   Medications: dulcolax, calcium-vit D, colace, ferrous sulfate, MVI with minerals, 20 mEq KCl,  Labs: CBG 88-132   Diet Order:   Diet Order            DIET DYS 2 Room service appropriate? Yes; Fluid consistency: Thin  Diet effective now              EDUCATION NEEDS:   No education needs have been identified at this time  Skin:  Skin Assessment: Skin Integrity Issues: Skin Integrity Issues:: DTI, Incisions DTI: right ear Incisions: chest  Last BM:  8/23  Height:   Ht Readings from Last 1 Encounters:  10/07/18 5\' 2"  (1.575 m)    Weight:   Wt Readings from Last 1 Encounters:  10/25/18 92.2 kg    Ideal Body Weight:  56.8 kg  BMI:  Body mass index is 37.18 kg/m.  Estimated Nutritional Needs:   Kcal:  1800-2000  Protein:  100-115 gm  Fluid:  >/= 1.8 L   Mariana Single RD, LDN Clinical Nutrition Pager # [phone]

## 2018-10-25 NOTE — Progress Notes (Addendum)
      [address]       [place] [address]             [phone]        18 Days Post-Op Procedure(s) (LRB): AORTIC VALVE REPLACEMENT (AVR) (N/A) TRANSESOPHAGEAL ECHOCARDIOGRAM (TEE) (N/A)  Subjective: Patient when asked if has nausea or abdominal pain this am responds "no".  Objective: Vital signs in last 24 hours: Temp:  [98.3 F (36.8 C)-98.7 F (37.1 C)] 98.5 F (36.9 C) (08/25 0314) Pulse Rate:  [80-91] 80 (08/25 0314) Cardiac Rhythm: Normal sinus rhythm (08/24 1900) Resp:  [15-26] 15 (08/25 0521) BP: (129-141)/(70-77) 141/76 (08/25 0314) SpO2:  [92 %-97 %] 96 % (08/25 0521) Weight:  [92.2 kg] 92.2 kg (08/25 0442)  Pre op weight 83.9 kg Current Weight  10/25/18 92.2 kg      Intake/Output from previous day: 08/24 0701 - 08/25 0700 In: 606 [P.O.:120; I.V.:10; NG/GT:476] Out: 1750 [Urine:1750]   Physical Exam:  Cardiovascular: RRR Pulmonary: Mostly clear Abdomen: Soft, non tender, sporadic bowel sounds present. Extremities: Mild UE/LE edema Wound: Clean and dry.  No erythema or signs of infection.  Lab Results: CBC: Recent Labs    10/24/18 0406  WBC 7.3  HGB 7.4*  HCT 24.4*  PLT 224   BMET:  Recent Labs    10/24/18 0406  NA 139  K 4.4  CL 104  CO2 27  GLUCOSE 118*  BUN 34*  CREATININE 1.56*  CALCIUM 8.1*    PT/INR:  Lab Results  Component Value Date   INR 1.7 (H) 10/07/2018   INR 1.4 (H) 10/01/2018   ABG:  INR: Will add last result for INR, ABG once components are confirmed Will add last 4 CBG results once components are confirmed  Assessment/Plan:  1. CV - SR in the 80's. On Lopressor 25 mg bid. No ACE/ARB secondary to elevated creatinine. 2.  Pulmonary - On room air this am.   Encourage incentive spirometer 3. AKI- Last creatinine stable at 1.56. Creatinine upon admission 1.7. She is volume overloaded and did not respond well to Lasix.  Continue Zaroxolyn 5 mg daily. 4. Expected acute blood loss anemia -   H and H this am decreased to 7.4 and 24.4. Occult blood negative. Continue oral ferrous 5. Mild thrombocytopenia resolved as platelets up to 224,000 6. ID-WBC decreased to 7,300. On Cefazolin 2 g bid and Rifampin 300 mg tid for MSSA. Per infectious disease, will need a total of 6 weeks (last dose 09/17). Has PICC line 7. Hypothyroidism-on Levothyroxine 125 mcg daily 8. GI-per nutritional management, po intake inadequate. Has Cortrak, TFs at 50 ml/hour NIGHTLY. Continue calorie count. Also, on MVI, and on Dysphagia II diet.Appreciate nutrition assistance. KUB on 08/21 showed nonspecific bowel gas pattern with a few mildly dilated loops of small bowel scattered throughout the abdomen. Hope to avoid PEG 9. Urinary retention-on Flomax 0.4 mg daily. Required multiple in and out cath's so foley re inserted. Will remove sooner rather than later as has new aortic valve. 10. CBGs 106/128/118. No history of diabetes but on tube feedings at night. 11. Deconditioning-continue PT/OT. Would benefit from SNF bed no "bed offers" yet   M ZimmermanPA-C 10/25/2018,7:10 AM [phone]

## 2018-10-25 NOTE — Progress Notes (Signed)
Physical Therapy Treatment Patient Details Name: Audrey SevereSheila D Fairburn MRN: [id number] DOB: [birth year] Today's Date: 10/25/2018    History of Present Illness 58 yo female presented with fever, vomiting, weakness.  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  Echo showed vegetation.  Transferred to ICU 8/04 due to respiratory failure.  Underwent AVR with TEE intraoprative on 10/07/18. Extubated 10/08/18.    PT Comments    Patient received in bed, not wanting to participate in PT, with encouragement from myself and family patient agrees to bed exercises. Patient noted to have swelling throughout body. She required assistance for all UE and LE exercises due to weakness. Positioned patient with extremities on pillows to alleviate swelling. Patient will benefit from continued skilled PT to improve strength, transfers and overall independence with mobility.      Follow Up Recommendations  SNF     Equipment Recommendations  Other (comment)(to be determined at next venue)    Recommendations for Other Services       Precautions / Restrictions Precautions Precautions: Fall;Sternal Restrictions Weight Bearing Restrictions: No Other Position/Activity Restrictions: sternal precautions     Mobility  Bed Mobility               General bed mobility comments: patient declines sitting up on side of bed this visit due to not feeling well, fatigue  Transfers                 General transfer comment: patient declined OOB activity this day  Ambulation/Gait             General Gait Details: unable   Stairs             Wheelchair Mobility    Modified Rankin (Stroke Patients Only)       Balance                                            Cognition Arousal/Alertness: Lethargic Behavior During Therapy: Flat affect Overall Cognitive Status: Impaired/Different from baseline Area of Impairment: Attention;Following commands;Safety/judgement;Problem  solving;Awareness                   Current Attention Level: Selective   Following Commands: Follows one step commands inconsistently;Follows one step commands with increased time     Problem Solving: Slow processing;Requires verbal cues;Requires tactile cues;Decreased initiation General Comments: per chart, history of cognitive deficits (developmental delays) at baseline      Exercises Other Exercises Other Exercises: BLE strengthening/ROM exercises: AP, heel slides, hip abd/add, SAQ, SLR x 10 reps each with assistance.  BUE exercises: hand opening closing, elbow flexion/ext, shoulder flex/ext x 10 reps with assist.    General Comments        Pertinent Vitals/Pain Pain Assessment: Faces Faces Pain Scale: Hurts a little bit Pain Location: general, legs Pain Descriptors / Indicators: Discomfort Pain Intervention(s): Limited activity within patient's tolerance;Monitored during session;Repositioned    Home Living                      Prior Function            PT Goals (current goals can now be found in the care plan section) Acute Rehab PT Goals Patient Stated Goal: to get home PT Goal Formulation: With patient Time For Goal Achievement: 11/01/18 Potential to Achieve Goals: Fair Progress towards PT goals: Progressing toward goals  Frequency    Min 3X/week      PT Plan Current plan remains appropriate    Co-evaluation              AM-PAC PT "6 Clicks" Mobility   Outcome Measure  Help needed turning from your back to your side while in a flat bed without using bedrails?: A Lot Help needed moving from lying on your back to sitting on the side of a flat bed without using bedrails?: A Lot Help needed moving to and from a bed to a chair (including a wheelchair)?: Total Help needed standing up from a chair using your arms (e.g., wheelchair or bedside chair)?: A Lot Help needed to walk in hospital room?: Total Help needed climbing 3-5 steps  with a railing? : Total 6 Click Score: 9    End of Session   Activity Tolerance: Patient limited by lethargy Patient left: in bed;with call bell/phone within reach;with family/visitor present Nurse Communication: Mobility status PT Visit Diagnosis: Muscle weakness (generalized) (M62.81);Other abnormalities of gait and mobility (R26.89);Pain Pain - Right/Left: (generalized)     Time: 1638-4536 PT Time Calculation (min) (ACUTE ONLY): 16 min  Charges:  $Therapeutic Exercise: 8-22 mins                     Ameera Tigue, PT, GCS 10/25/18,2:12 PM

## 2018-10-25 NOTE — Progress Notes (Signed)
PT Cancellation Note  Patient Details Name: Audrey Collier MRN: [id number] DOB: 03-26-1960   Cancelled Treatment:    Reason Eval/Treat Not Completed: Other (comment) Patient reports she is not feeling well. Declines PT at this time. RN notified.  Will re-attempt later if time allows.    [name] 10/25/2018, 10:17 AM

## 2018-10-26 LAB — BASIC METABOLIC PANEL
Anion gap: 8 (ref 5–15)
BUN: 36 mg/dL — ABNORMAL HIGH (ref 6–20)
CO2: 28 mmol/L (ref 22–32)
Calcium: 8.3 mg/dL — ABNORMAL LOW (ref 8.9–10.3)
Chloride: 103 mmol/L (ref 98–111)
Creatinine, Ser: 1.55 mg/dL — ABNORMAL HIGH (ref 0.44–1.00)
GFR calc Af Amer: 43 mL/min — ABNORMAL LOW (ref >60)
GFR calc non Af Amer: 37 mL/min — ABNORMAL LOW (ref >60)
Glucose, Bld: 124 mg/dL — ABNORMAL HIGH (ref 70–99)
Potassium: 5.1 mmol/L (ref 3.5–5.1)
Sodium: 139 mmol/L (ref 135–145)

## 2018-10-26 LAB — GLUCOSE, CAPILLARY
Glucose-Capillary: 121 mg/dL — ABNORMAL HIGH (ref 70–99)
Glucose-Capillary: 100 mg/dL — ABNORMAL HIGH (ref 70–99)
Glucose-Capillary: 92 mg/dL (ref 70–99)
Glucose-Capillary: 108 mg/dL — ABNORMAL HIGH (ref 70–99)
Glucose-Capillary: 95 mg/dL (ref 70–99)

## 2018-10-26 NOTE — Progress Notes (Signed)
Calorie Count Note  48 hour calorie count ordered.  No tickets observed in calorie count envelope. Per nursing documentation pt consumed 15% for breakfast yesterday (no other meal completion documented).   Continue tube feeding:  -Osmolite 1.5@ 38ml/hr via Cortrak x 10 hours (8pm-6am) -30 ml Prostat BID  Provides:650kcals, 58grams protein, 373ml free water. Meets 36% kcal and 58% of protein needs.  Will check back tomorrow. May need to consider increasing tube feeding again if intake remains poor.   Mariana Single RD, LDN Clinical Nutrition Pager # [phone]

## 2018-10-26 NOTE — TOC Progression Note (Addendum)
Transition of Care [facility]) - Progression Note    Patient Details  Name: Audrey Collier MRN: [id number] Date of Birth: 1960/05/04  Transition of [facility]) CM/SW [place], [place] Phone Number: 10/26/2018, 1:52 PM  Clinical Narrative:     10/25/18-Patient's [name], states she was informed by nursing staff, she could not get any updates regarding patient's care. She was concern that patient's Aunt [name] was informing staff to contact her only. [name] states she approves [name] being involved but does not want her being the primary contact person. CSW updated charge nurse.  10/26/18-CSW visit with the patient at bedside. Patient's Aunt [name] was in the room. CSW provided update, No bed offers and explained patient can not discharge to SNF with cortrak.   CSW discussed with patient's [name], staff will not get involved with family dynamics. Per [name],the patient lives with her and her Ripley Fraise, and  they have been responsible for her care. [name] agrees and  states she understands and she was not trying to "take over". She is retired and works part-time and only wants to help and do what is best for [employer]".   [name], MSW, LCSWA Clinical Social Worker [phone]            Barriers to Discharge: Continued Medical Work up  Expected Discharge Plan and Services         Living arrangements for the past 2 months: Single Family Home                                       Social Determinants of Health (SDOH) Interventions    Readmission Risk Interventions No flowsheet data found.

## 2018-10-26 NOTE — Progress Notes (Signed)
      [address]       [place], [address]             [phone]      19 Days Post-Op Procedure(s) (LRB): AORTIC VALVE REPLACEMENT (AVR) (N/A) TRANSESOPHAGEAL ECHOCARDIOGRAM (TEE) (N/A)   Subjective:  No new complaints.  She states doesn't feel well, wants to go home. She states she is eating some.  Objective: Vital signs in last 24 hours: Temp:  [97.7 F (36.5 C)-98.8 F (37.1 C)] 98.8 F (37.1 C) (08/26 0400) Pulse Rate:  [83-91] 87 (08/26 0400) Cardiac Rhythm: Normal sinus rhythm (08/25 1900) Resp:  [17-23] 22 (08/26 0400) BP: (117-133)/(72-82) 133/82 (08/26 0400) SpO2:  [92 %-98 %] 93 % (08/26 0400) Weight:  [92.9 kg] 92.9 kg (08/26 0500)  Intake/Output from previous day: 08/25 0701 - 08/26 0700 In: 735 [P.O.:150; NG/GT:585] Out: 1950 [Urine:1950]  General appearance: alert, cooperative and no distress Heart: regular rate and rhythm Lungs: clear to auscultation bilaterally Abdomen: soft, non-tender; bowel sounds normal; no masses,  no organomegaly Extremities: edema +1 pitting Wound: clean and dry  Lab Results: Recent Labs    10/24/18 0406  WBC 7.3  HGB 7.4*  HCT 24.4*  PLT 224   BMET:  Recent Labs    10/24/18 0406 10/26/18 0510  NA 139 139  K 4.4 5.1  CL 104 103  CO2 27 28  GLUCOSE 118* 124*  BUN 34* 36*  CREATININE 1.56* 1.55*  CALCIUM 8.1* 8.3*    PT/INR: No results for input(s): LABPROT, INR in the last 72 hours. ABG    Component Value Date/Time   PHART 7.430 10/08/2018 0920   HCO3 23.6 10/08/2018 0920   TCO2 25 10/08/2018 0920   ACIDBASEDEF 6.0 (H) 10/08/2018 0307   O2SAT 94.0 10/08/2018 0920   CBG (last 3)  Recent Labs    10/25/18 2000 10/25/18 2353 10/26/18 0416  GLUCAP 97 124* 121*    Assessment/Plan: S/P Procedure(s) (LRB): AORTIC VALVE REPLACEMENT (AVR) (N/A) TRANSESOPHAGEAL ECHOCARDIOGRAM (TEE) (N/A)  1. CV- NSR, BP controlled- on Lopressor 2. Pulm- no acute issues, off oxygen 3. Renal-  creatinine at baseline, weight is above baseline, continue Zaroxolyn 4. ID- leukocytosis resolved, remains afebrile on ABX per ID 5. GI- nutrition following, continue calorie count, Cortrak in place with tube feeds, can hopefully increase oral consumption to remove Cortrak 6. Urinary retention- on Flomax, possibly d/c foley soon 7. Deconditioning- PT/OT needs SNF, however no bed offers yet    LOS: 25 days    Ellwood Handler 10/26/2018

## 2018-10-26 NOTE — Progress Notes (Addendum)
Noted pt is not getting OOB or ambulating. CR will sign off at this time. We can be reconsulted if she begins to progress with ambulation.  Yves Dill CES, ACSM 7:40 AM 10/26/2018

## 2018-10-26 NOTE — Progress Notes (Signed)
Occupational Therapy Treatment Patient Details Name: Audrey Collier MRN: [id number] DOB: 14-Jul-1960 Today's Date: 10/26/2018    History of present illness 58 yo female presented with fever, vomiting, weakness.  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  Echo showed vegetation.  Transferred to ICU 8/04 due to respiratory failure.  Underwent AVR with TEE intraoprative on 10/07/18. Extubated 10/08/18. PMH includes cognitive delay, anxiety, hypothyroidism, and post menopausal bleeding (worked up 5 years ago)    OT comments  Pt making slow, steady progress towards acute OT goals. Pt declined attempting to stand or using stedy to transfer to recliner but ultimately agreeable to sitting EOB for a few minutes with attempt at one grooming task in this position incoporated. Pt fatigued at end of session. D/c plan remains appropriate.   Follow Up Recommendations  SNF;Supervision/Assistance - 24 hour    Equipment Recommendations  3 in 1 bedside commode    Recommendations for Other Services      Precautions / Restrictions Precautions Precautions: Fall;Sternal Restrictions Weight Bearing Restrictions: No Other Position/Activity Restrictions: sternal precautions        Mobility Bed Mobility Overal bed mobility: Needs Assistance Bed Mobility: Supine to Sit;Sit to Supine     Supine to sit: HOB elevated;Mod assist Sit to supine: Max assist;+2 for physical assistance   General bed mobility comments: HOB elevated. Pt able to start working BLE towards EOB. Assist for remainder of tasks.   Transfers                 General transfer comment: patient declined OOB activity this day    Balance Overall balance assessment: Needs assistance Sitting-balance support: Feet supported;Single extremity supported Sitting balance-Leahy Scale: Poor Sitting balance - Comments: reliant on single UE support to maintain balance, intermittent loss of balance able to self correct;sat EOB for about 10  min Postural control: Posterior lean;Left lateral lean                                 ADL either performed or assessed with clinical judgement   ADL Overall ADL's : Needs assistance/impaired     Grooming: Brushing hair;Maximal assistance;Sitting Grooming Details (indicate cue type and reason): limited shoulder ROM impacting ability to comb hair;required assistance for completion of task;                               General ADL Comments: Sat EOB a few minutes. Pt declined transfer into recliner even with stedy. Discussed trying this later today/tomorrow with nursing or next time with OT/PT     Vision       Perception     Praxis      Cognition Arousal/Alertness: Awake/alert Behavior During Therapy: Flat affect Overall Cognitive Status: Impaired/Different from baseline Area of Impairment: Attention;Following commands;Safety/judgement;Problem solving;Awareness                   Current Attention Level: Selective   Following Commands: Follows one step commands inconsistently;Follows one step commands with increased time   Awareness: Intellectual Problem Solving: Slow processing;Requires verbal cues;Requires tactile cues;Decreased initiation General Comments: per chart, history of cognitive deficits (developmental delays) at baseline        Exercises     Shoulder Instructions       General Comments      Pertinent Vitals/ Pain       Pain Assessment: Faces  Faces Pain Scale: Hurts little more Pain Location: general, legs Pain Descriptors / Indicators: Discomfort Pain Intervention(s): Limited activity within patient's tolerance;Monitored during session;Repositioned  Home Living                                          Prior Functioning/Environment              Frequency  Min 2X/week        Progress Toward Goals  OT Goals(current goals can now be found in the care plan section)  Progress towards OT  goals: Progressing toward goals  Acute Rehab OT Goals Patient Stated Goal: to get home OT Goal Formulation: With patient Time For Goal Achievement: 12/08/18(goals remain appropraite) Potential to Achieve Goals: Good ADL Goals Pt Will Perform Grooming: with supervision;with set-up;sitting Pt Will Perform Lower Body Dressing: with mod assist;sit to/from stand Pt Will Transfer to Toilet: with min assist;with +2 assist;bedside commode Pt/caregiver will Perform Home Exercise Program: Increased ROM;Increased strength;With written HEP provided;Both right and left upper extremity Additional ADL Goal #1: Patient will complete bed mobility with min assist +2, and maintain sitting balance with min guard as precursor to ADLs.  Plan Frequency remains appropriate;Discharge plan remains appropriate    Co-evaluation                 AM-PAC OT "6 Clicks" Daily Activity     Outcome Measure   Help from another person eating meals?: A Little Help from another person taking care of personal grooming?: A Lot Help from another person toileting, which includes using toliet, bedpan, or urinal?: A Lot Help from another person bathing (including washing, rinsing, drying)?: A Lot Help from another person to put on and taking off regular upper body clothing?: A Lot Help from another person to put on and taking off regular lower body clothing?: Total 6 Click Score: 12    End of Session    OT Visit Diagnosis: Other abnormalities of gait and mobility (R26.89);Muscle weakness (generalized) (M62.81);Pain   Activity Tolerance Patient limited by fatigue;Patient tolerated treatment well   Patient Left in bed;with call bell/phone within reach;with bed alarm set   Nurse Communication          Time: [id number] OT Time Calculation (min): 21 min  Charges: OT General Charges $OT Visit: 1 Visit OT Treatments $Self Care/Home Management : 8-22 mins  Raynald KempKathryn Osiris Charles, OT Acute Rehabilitation  Services Pager: [phone] Office: [phone]    Pilar GrammesMathews, Drequan Ironside H 10/26/2018, 1:40 PM

## 2018-10-27 LAB — GLUCOSE, CAPILLARY
Glucose-Capillary: 103 mg/dL — ABNORMAL HIGH (ref 70–99)
Glucose-Capillary: 104 mg/dL — ABNORMAL HIGH (ref 70–99)
Glucose-Capillary: 117 mg/dL — ABNORMAL HIGH (ref 70–99)
Glucose-Capillary: 118 mg/dL — ABNORMAL HIGH (ref 70–99)
Glucose-Capillary: 95 mg/dL (ref 70–99)
Glucose-Capillary: 98 mg/dL (ref 70–99)

## 2018-10-27 MED ORDER — OSMOLITE 1.5 CAL PO LIQD
1000.0000 mL | ORAL | Status: DC
  Administered 2018-10-27 – 2018-10-31 (×5): 1000 mL
  Filled 2018-10-27 (×7): qty 1000

## 2018-10-27 NOTE — Progress Notes (Signed)
Nutrition Follow-up  DOCUMENTATION CODES:   Not applicable  INTERVENTION:    D/C Calorie Count  Continue Magic cup TID with meals, each supplement provides 290 kcal and 9 grams of protein  Continue snack daily   Increase tube feeding:  -Osmolite 1.5 @ 60 ml/hr via Cortrak x 10 hours (8pm-6am) -30 ml Prostat BID  Provides: 1100 kcals, 68 grams protein, 457 ml free water. Meets 61% kcal and 68% of protein needs.   NUTRITION DIAGNOSIS:   Inadequate oral intake related to poor appetite as evidenced by meal completion < 50%.  Ongoing  GOAL:   Patient will meet greater than or equal to 90% of their needs  Not meeting PO- progressing slightly   MONITOR:   PO intake, Supplement acceptance, Diet advancement, Labs, Weight trends, I & O's, Skin  REASON FOR ASSESSMENT:   Consult Enteral/tube feeding initiation and management  ASSESSMENT:   58 yo female admitted with fever, vomiting, weakness. Found to have MSSA bacteremia with PNA. Echo showed vegetation. Required transfer to the ICU and intubation on 8/4. PMH includes developmental delay, GERD, anxiety, hypothyroidism, HLD.   8/06 - TEE showing large aortic valve vegetation 8/07 - s/p aortic valve replacement 8/08 - extubated, diet advanced to Dysphagia 2 8/21 - Cortrak placement(tip in stomach); spoke with TCTS, ok to start TF  No meal tickets for 8/25- will use yesterdays information for Day 2 results.   Day 2 results (8/26) Breakfast: 89 kcal, 5 g protein Lunch: 90 kcal, 6 g protein Snack: 152 kcal, 9 g protien Dinner: 0 kcal, 0 g protein Supplements: 0 kcal, 0 g protein  Total intake: 331 kcal (18% of minimum estimated needs)  20 protein (20% of minimum estimated needs)  Nurse reports pt denies abdominal pain. Having some nausea after tube feedings. Night time nurse has been giving zofran before and after feedings- seems to be helping. Pt continues to have poor intake. Suspect intake goes up when aunt is  available to help. Recommend assistance with all meals. Will increase tube feeding to better meet needs. May consider adding prokinetic agent to promote digestion. Will d/c calorie count as 48 hours is over.   Admission weight: 77.7 kg Current weight: 88.7 kg (+2 edema BUE and +3 edema BLE continues)   I/O: -110 ml since 8/13 UOP: 1,600 ml x 24 hrs   Medications: dulcolax, calcium-vit D, colace, ferrous sulfate, MVI with minerals Labs: CBG 95-124   Diet Order:   Diet Order            DIET DYS 2 Room service appropriate? No; Fluid consistency: Thin  Diet effective now              EDUCATION NEEDS:   No education needs have been identified at this time  Skin:  Skin Assessment: Skin Integrity Issues: Skin Integrity Issues:: DTI, Incisions DTI: right ear Incisions: chest  Last BM:  8/25  Height:   Ht Readings from Last 1 Encounters:  10/07/18 5\' 2"  (1.575 m)    Weight:   Wt Readings from Last 1 Encounters:  10/27/18 88.7 kg    Ideal Body Weight:  56.8 kg  BMI:  Body mass index is 35.77 kg/m.  Estimated Nutritional Needs:   Kcal:  1800-2000  Protein:  100-115 gm  Fluid:  >/= 1.8 L   Mariana Single RD, LDN Clinical Nutrition Pager # [phone]

## 2018-10-27 NOTE — Progress Notes (Addendum)
      [address]       [place] [address]             [phone]        20 Days Post-Op Procedure(s) (LRB): AORTIC VALVE REPLACEMENT (AVR) (N/A) TRANSESOPHAGEAL ECHOCARDIOGRAM (TEE) (N/A)  Subjective: Patient when asked if hasabdominal pain this am responds "no". She does admit to nausea.  Objective: Vital signs in last 24 hours: Temp:  [98.6 F (37 C)-98.9 F (37.2 C)] 98.6 F (37 C) (08/27 0407) Pulse Rate:  [78-90] 78 (08/27 0407) Cardiac Rhythm: Normal sinus rhythm (08/26 2000) Resp:  [20-28] 20 (08/27 0407) BP: (114-137)/(71-82) 114/71 (08/27 0407) SpO2:  [92 %-97 %] 97 % (08/27 0407) Weight:  [88.7 kg] 88.7 kg (08/27 0407)  Pre op weight 83.9 kg Current Weight  10/27/18 88.7 kg      Intake/Output from previous day: 08/26 0701 - 08/27 0700 In: 652 [P.O.:75; NG/GT:577] Out: 1600 [Urine:1600]   Physical Exam:  Cardiovascular: RRR Pulmonary: Mostly clear Abdomen: Soft, non tender, tympanic sporadic bowel sounds present. Extremities: Mild UE/LE edema Wound: Clean and dry.  No erythema or signs of infection.  Lab Results: CBC: No results for input(s): WBC, HGB, HCT, PLT in the last 72 hours. BMET:  Recent Labs    10/26/18 0510  NA 139  K 5.1  CL 103  CO2 28  GLUCOSE 124*  BUN 36*  CREATININE 1.55*  CALCIUM 8.3*    PT/INR:  Lab Results  Component Value Date   INR 1.7 (H) 10/07/2018   INR 1.4 (H) 10/01/2018   ABG:  INR: Will add last result for INR, ABG once components are confirmed Will add last 4 CBG results once components are confirmed  Assessment/Plan:  1. CV - SR in the 80's. On Lopressor 25 mg bid. No ACE/ARB secondary to elevated creatinine. 2.  Pulmonary - On 2 liters of oxygen this am.   Encourage incentive spirometer 3. AKI- Creatinine yesterday stable at 1.55. Creatinine upon admission 1.7. As discussed with Dr. Kipp Brood, will stop Zaroxolyn for now and monitor. 4. Expected acute blood loss anemia -  H  and H this am decreased to 7.4 and 24.4. Occult blood negative. Continue oral ferrous 5. Mild thrombocytopenia resolved as platelets up to 224,000 6. ID-WBC decreased to 7,300. On Cefazolin 2 g bid and Rifampin 300 mg tid for MSSA. Per infectious disease, will need a total of 6 weeks (last dose 09/17). Has PICC line 7. Hypothyroidism-on Levothyroxine 125 mcg daily 8. GI-per nutritional management, po intake inadequate. Has Cortrak, TFs at 45 ml/hour NIGHTLY, which may need to be increased. Continue calorie count. Also, on MVI, and on Dysphagia II diet. Appreciate nutrition assistance.  Hope to avoid PEG 9. Urinary retention-on Flomax 0.4 mg daily. Foley in place-will discuss with Dr. Kipp Brood as has had AVR and want to avoid increased risk of infection. 10. CBGs 95/118/117. No history of diabetes but on tube feedings at night. 11. Deconditioning-continue PT/OT. Would benefit from SNF bed no "bed offers" yet and won't accept patient with Driscilla Grammes ZimmermanPA-C 10/27/2018,7:09 AM [phone]

## 2018-10-27 NOTE — Plan of Care (Signed)
  Problem: Health Behavior/Discharge Planning: Goal: Ability to manage health-related needs will improve Outcome: Progressing   Problem: Clinical Measurements: Goal: Ability to maintain clinical measurements within normal limits will improve Outcome: Progressing   

## 2018-10-27 NOTE — Progress Notes (Signed)
PT Cancellation Note  Patient Details Name: Audrey Collier MRN: [id number] DOB: Dec 25, 1960   Cancelled Treatment:    Reason Eval/Treat Not Completed: Other (comment) Pt declining therapy, simply stating "I don't want to." Provided education and max encouragement for pt to participate with therapy. Pt continued to decline. Requested if pt would be agreeable to participate tomorrow, and she stated she would. Will continue to follow.  Benjiman Core, PTA Pager [phone] Acute Rehab  Allena Katz 10/27/2018, 2:06 PM

## 2018-10-28 ENCOUNTER — Ambulatory Visit: Payer: Self-pay | Admitting: Thoracic Surgery (Cardiothoracic Vascular Surgery)

## 2018-10-28 LAB — BASIC METABOLIC PANEL
Anion gap: 9 (ref 5–15)
BUN: 42 mg/dL — ABNORMAL HIGH (ref 6–20)
CO2: 27 mmol/L (ref 22–32)
Calcium: 8.4 mg/dL — ABNORMAL LOW (ref 8.9–10.3)
Chloride: 104 mmol/L (ref 98–111)
Creatinine, Ser: 1.71 mg/dL — ABNORMAL HIGH (ref 0.44–1.00)
GFR calc Af Amer: 38 mL/min — ABNORMAL LOW (ref >60)
GFR calc non Af Amer: 33 mL/min — ABNORMAL LOW (ref >60)
Glucose, Bld: 128 mg/dL — ABNORMAL HIGH (ref 70–99)
Potassium: 4.9 mmol/L (ref 3.5–5.1)
Sodium: 140 mmol/L (ref 135–145)

## 2018-10-28 LAB — GLUCOSE, CAPILLARY
Glucose-Capillary: 102 mg/dL — ABNORMAL HIGH (ref 70–99)
Glucose-Capillary: 124 mg/dL — ABNORMAL HIGH (ref 70–99)
Glucose-Capillary: 133 mg/dL — ABNORMAL HIGH (ref 70–99)
Glucose-Capillary: 87 mg/dL (ref 70–99)
Glucose-Capillary: 88 mg/dL (ref 70–99)
Glucose-Capillary: 91 mg/dL (ref 70–99)

## 2018-10-28 NOTE — Progress Notes (Signed)
      [address]       [place] [address]             [phone]        21 Days Post-Op Procedure(s) (LRB): AORTIC VALVE REPLACEMENT (AVR) (N/A) TRANSESOPHAGEAL ECHOCARDIOGRAM (TEE) (N/A)  Subjective: Patient states "I don't feel good". She specifically denies nausea, abdominal pain, or vomiting.  Objective: Vital signs in last 24 hours: Temp:  [98.1 F (36.7 C)-99 F (37.2 C)] 98.1 F (36.7 C) (08/28 0801) Pulse Rate:  [51-81] 81 (08/28 0801) Cardiac Rhythm: Supraventricular tachycardia (08/28 0337) Resp:  [17-20] 20 (08/28 0801) BP: (110-131)/(56-79) 131/64 (08/28 0801) SpO2:  [92 %-97 %] 95 % (08/28 0801) Weight:  [89.5 kg] 89.5 kg (08/28 0415)  Pre op weight 83.9 kg Current Weight  10/28/18 89.5 kg      Intake/Output from previous day: 08/27 0701 - 08/28 0700 In: 2282.9 [P.O.:240; I.V.:175.9; NG/GT:666.8; IV Piggyback:1200.2] Out: 1300 [Urine:1300]   Physical Exam:  Cardiovascular: RRR, no murmur Pulmonary: Mostly clear Abdomen: Soft, non tender,  sporadic bowel sounds present. Extremities: ++ UE edema Wound: Clean and dry.  No erythema or signs of infection.  Lab Results: CBC: No results for input(s): WBC, HGB, HCT, PLT in the last 72 hours. BMET:  Recent Labs    10/26/18 0510 10/28/18 0350  NA 139 140  K 5.1 4.9  CL 103 104  CO2 28 27  GLUCOSE 124* 128*  BUN 36* 42*  CREATININE 1.55* 1.71*  CALCIUM 8.3* 8.4*    PT/INR:  Lab Results  Component Value Date   INR 1.7 (H) 10/07/2018   INR 1.4 (H) 10/01/2018   ABG:  INR: Will add last result for INR, ABG once components are confirmed Will add last 4 CBG results once components are confirmed  Assessment/Plan:  1. CV - SR in the 80's. On Lopressor 25 mg bid. No ACE/ARB secondary to elevated creatinine. 2.  Pulmonary - On room air.  Encourage incentive spirometer 3. AKI- Creatinine this am slightly increased to 1.71. Creatinine upon admission 1.7.  Zaroxolyn stopped   4. Expected acute blood loss anemia -  Last H and H this am decreased to 7.4 and 24.4. Occult blood negative. Continue oral ferrous 5. ID-Last WBC decreased to 7,300. On Cefazolin 2 g bid and Rifampin 300 mg tid for MSSA. Per infectious disease, will need a total of 6 weeks (last dose 09/17). Has PICC line 6. Hypothyroidism-on Levothyroxine 125 mcg daily 7. GI-per nutritional management, po intake inadequate. Has Cortrak, TFs increased to 60 ml/hour NIGHTLY. Continue calorie count. Also, on MVI, and on Dysphagia II diet. Appreciate nutrition assistance.  Hope to avoid PEG 8. Urinary retention-on Flomax 0.4 mg daily. Foley in place-will discuss with Dr. Kipp Brood as has had AVR and want to avoid increased risk of infection. 9. CBGs 124/133/88. No history of diabetes but on tube feedings at night. 10. Deconditioning-continue PT/OT. Would benefit from SNF bed no "bed offers" yet and won't accept patient with Driscilla Grammes ZimmermanPA-C 10/28/2018,8:06 AM [phone]

## 2018-10-28 NOTE — Plan of Care (Signed)
  Problem: Health Behavior/Discharge Planning: Goal: Ability to manage health-related needs will improve Outcome: Progressing   

## 2018-10-28 NOTE — Progress Notes (Signed)
Physical Therapy Treatment Patient Details Name: Audrey Collier MRN: [id number] DOB: [birth year] Today's Date: 10/28/2018    History of Present Illness 58 yo female presented with fever, vomiting, weakness.  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  Echo showed vegetation.  Transferred to ICU 8/04 due to respiratory failure.  Underwent AVR with TEE intraoprative on 10/07/18. Extubated 10/08/18.    PT Comments    Pt initially refusing mobility. With maximal encouragement pt agreeable to standing with Stedy but refuses to sit up in recliner. Pt modA to come to sitting EoB. [facility] Pt able to stand for 10 sec statically. After standing pt request to return to bed due to pain and fatigue. Pt requires maxA to return to bed. D/c plans remain appropriate.     Follow Up Recommendations  SNF     Equipment Recommendations  Other (comment)(to be determined at next venue)       Precautions / Restrictions Precautions Precautions: Fall;Sternal Restrictions Weight Bearing Restrictions: No Other Position/Activity Restrictions: sternal precautions     Mobility  Bed Mobility Overal bed mobility: Needs Assistance       Supine to sit: HOB elevated;Mod assist Sit to supine: Max assist;+2 for physical assistance   General bed mobility comments: modA for bringing trunk to upright to sit EoB, pt fatigued with standing requiring maxAx2 for return to bed  Transfers Overall transfer level: Needs assistance   Transfers: Sit to/from Stand Sit to Stand: Min assist;Mod assist         General transfer comment: modAx2 for power up from lower bed surface, and minA x2 for powerup from Stedy pads, vc for upright posture to clear Stedy pads  Ambulation/Gait             General Gait Details: unable         Balance   Sitting-balance support: Feet supported;Single extremity supported Sitting balance-Leahy Scale: Fair Sitting balance - Comments: requires at  least single UE support to maintain balance                                    Cognition Arousal/Alertness: Lethargic Behavior During Therapy: Flat affect Overall Cognitive Status: Impaired/Different from baseline Area of Impairment: Attention;Following commands;Safety/judgement;Problem solving;Awareness                   Current Attention Level: Selective   Following Commands: Follows one step commands inconsistently;Follows one step commands with increased time     Problem Solving: Slow processing;Requires verbal cues;Requires tactile cues;Decreased initiation General Comments: per chart, history of cognitive deficits (developmental delays) at baseline         General Comments General comments (skin integrity, edema, etc.): VSS, on 2L O2 via [place]       Pertinent Vitals/Pain Pain Assessment: Faces Faces Pain Scale: Hurts a little bit Pain Location: abdomen Pain Descriptors / Indicators: Discomfort;Grimacing Pain Intervention(s): Limited activity within patient's tolerance;Monitored during session;Repositioned           PT Goals (current goals can now be found in the care plan section) Acute Rehab PT Goals Patient Stated Goal: to get home PT Goal Formulation: With patient Time For Goal Achievement: 11/01/18 Potential to Achieve Goals: Fair Progress towards PT goals: Progressing toward goals    Frequency    Min 3X/week      PT Plan Current plan remains appropriate  AM-PAC PT "6 Clicks" Mobility   Outcome Measure  Help needed turning from your back to your side while in a flat bed without using bedrails?: A Lot Help needed moving from lying on your back to sitting on the side of a flat bed without using bedrails?: A Lot Help needed moving to and from a bed to a chair (including a wheelchair)?: Total Help needed standing up from a chair using your arms (e.g., wheelchair or bedside chair)?: A Lot Help needed to walk in hospital room?:  Total Help needed climbing 3-5 steps with a railing? : Total 6 Click Score: 9    End of Session Equipment Utilized During Treatment: Gait belt Activity Tolerance: Patient limited by lethargy Patient left: in bed;with call bell/phone within reach;with family/visitor present Nurse Communication: Mobility status PT Visit Diagnosis: Muscle weakness (generalized) (M62.81);Other abnormalities of gait and mobility (R26.89);Pain Pain - Right/Left: (generalized)     Time: 7989-2119 PT Time Calculation (min) (ACUTE ONLY): 19 min  Charges:  $Therapeutic Activity: 8-22 mins                     Mersades Barbaro B. Migdalia Dk PT, DPT Acute Rehabilitation Services Pager [phone] Office [phone]    [place] 10/28/2018, 2:11 PM

## 2018-10-29 LAB — GLUCOSE, CAPILLARY
Glucose-Capillary: 100 mg/dL — ABNORMAL HIGH (ref 70–99)
Glucose-Capillary: 110 mg/dL — ABNORMAL HIGH (ref 70–99)
Glucose-Capillary: 110 mg/dL — ABNORMAL HIGH (ref 70–99)
Glucose-Capillary: 134 mg/dL — ABNORMAL HIGH (ref 70–99)
Glucose-Capillary: 138 mg/dL — ABNORMAL HIGH (ref 70–99)
Glucose-Capillary: 88 mg/dL (ref 70–99)

## 2018-10-29 LAB — URINALYSIS, ROUTINE W REFLEX MICROSCOPIC
Bacteria, UA: NONE SEEN
Bilirubin Urine: NEGATIVE
Glucose, UA: NEGATIVE mg/dL
Ketones, ur: NEGATIVE mg/dL
Nitrite: NEGATIVE
Protein, ur: 100 mg/dL — AB
Specific Gravity, Urine: 1.014 (ref 1.005–1.030)
pH: 6 (ref 5.0–8.0)

## 2018-10-29 LAB — BASIC METABOLIC PANEL
Anion gap: 9 (ref 5–15)
BUN: 41 mg/dL — ABNORMAL HIGH (ref 6–20)
CO2: 28 mmol/L (ref 22–32)
Calcium: 8.3 mg/dL — ABNORMAL LOW (ref 8.9–10.3)
Chloride: 102 mmol/L (ref 98–111)
Creatinine, Ser: 1.48 mg/dL — ABNORMAL HIGH (ref 0.44–1.00)
GFR calc Af Amer: 45 mL/min — ABNORMAL LOW (ref >60)
GFR calc non Af Amer: 39 mL/min — ABNORMAL LOW (ref >60)
Glucose, Bld: 129 mg/dL — ABNORMAL HIGH (ref 70–99)
Potassium: 4.6 mmol/L (ref 3.5–5.1)
Sodium: 139 mmol/L (ref 135–145)

## 2018-10-29 MED ORDER — METOCLOPRAMIDE HCL 5 MG/ML IJ SOLN
5.0000 mg | Freq: Four times a day (QID) | INTRAMUSCULAR | Status: AC
  Administered 2018-10-29 – 2018-10-31 (×8): 5 mg via INTRAVENOUS
  Filled 2018-10-29 (×8): qty 2

## 2018-10-29 NOTE — Progress Notes (Signed)
Patient unable to void states doesn't feel the urge. Bladder scanned for greater than 200cc per MD order placed urinary catheter per [place] with one RN present.

## 2018-10-29 NOTE — Progress Notes (Addendum)
22 Days Post-Op Procedure(s) (LRB): AORTIC VALVE REPLACEMENT (AVR) (N/A) TRANSESOPHAGEAL ECHOCARDIOGRAM (TEE) (N/A) Subjective: Awake and alert. Denies pain but said she is uncomfortable in her upper abdomen and is having nausea.  The foley catheter was replaced last night per standing order for urinary retention.(264ml by bladder scan). She had an episode of vomiting after this encounter per RN.   Objective: Vital signs in last 24 hours: Temp:  [97.7 F (36.5 C)-99.1 F (37.3 C)] 99.1 F (37.3 C) (08/29 0818) Pulse Rate:  [79-87] 86 (08/29 0818) Cardiac Rhythm: Normal sinus rhythm (08/29 0800) Resp:  [16-21] 21 (08/29 0818) BP: (122-133)/(68-78) 133/73 (08/29 0818) SpO2:  [90 %-97 %] 96 % (08/29 0818) Weight:  [90.1 kg] 90.1 kg (08/29 0459)    Intake/Output from previous day: 08/28 0701 - 08/29 0700 In: 590 [NG/GT:590] Out: 1725 [Urine:925; Emesis/NG output:800] Intake/Output this shift: Total I/O In: 100 [P.O.:100] Out: 200 [Urine:200]  General appearance: alert, cooperative and mild distress Heart: Regular rate and rhythm. Arlice Colt is an expected systolic murmur c/w the aortic bioprosthetic valve.  Lungs: clear to auscultation bilaterally Abdomen: Mild distension, active bowel sounds.  Extremities: Moderate edema in all extremities. Wound: the sternotomy incision is intact and healing with no sign of complication.   Lab Results: No results for input(s): WBC, HGB, HCT, PLT in the last 72 hours. BMET:  Recent Labs    10/28/18 0350 10/29/18 0556  NA 140 139  K 4.9 4.6  CL 104 102  CO2 27 28  GLUCOSE 128* 129*  BUN 42* 41*  CREATININE 1.71* 1.48*  CALCIUM 8.4* 8.3*    PT/INR: No results for input(s): LABPROT, INR in the last 72 hours. 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 On IV rifampin and cefazolin through 9/17 via left arm PICC.   -Nutrition- Poor appetite, receiving TF at night. Had an episode of vomiting after taking her oral medications with applesauce this am.  Abdominal exam is benign except for mild distension.  Will add low-dose Reglan for 48 hours to improve gastric emptying.   -Urinary retention- Foley cath replaced per protocol for 271ml residual by bladder scan. Will check UA / reflex Cx today. Shei is on Flomax.  -AKI-creat continues to trend toward recovery. Monitor.  -Expected acute blood loss anemia. No new labs today but Hct has been stable. Continue Fe+ supplement, recheck CBC in AM..  -DVT PPX-continue Lovenox qhs  -Hypothyroidism- receiving replacement.   -Deconditioned state associated with prolonged illness- Continue PT/OT.    LOS: 28 days    Antony Odea, [place] [phone] 10/29/2018 Patient seen and examined, agree with above  Remo Lipps C. Roxan Hockey, MD [facility] [phone]

## 2018-10-29 NOTE — Progress Notes (Signed)
Emesis episode 1x. Undigested food. Pt only had 1 cup of applesauce w/ meds and half a cup of grape juice. Pt states her stomach discomfort is localized at the top of her stomach. Zofran given, will continue to monitor.

## 2018-10-30 LAB — GLUCOSE, CAPILLARY
Glucose-Capillary: 101 mg/dL — ABNORMAL HIGH (ref 70–99)
Glucose-Capillary: 108 mg/dL — ABNORMAL HIGH (ref 70–99)
Glucose-Capillary: 133 mg/dL — ABNORMAL HIGH (ref 70–99)
Glucose-Capillary: 135 mg/dL — ABNORMAL HIGH (ref 70–99)
Glucose-Capillary: 87 mg/dL (ref 70–99)
Glucose-Capillary: 96 mg/dL (ref 70–99)

## 2018-10-30 LAB — TSH: TSH: 19.826 u[IU]/mL — ABNORMAL HIGH (ref 0.350–4.500)

## 2018-10-30 MED ORDER — BISACODYL 5 MG PO TBEC: 10.0000 mg | DELAYED_RELEASE_TABLET | Freq: Every day | ORAL | Status: DC | PRN

## 2018-10-30 MED ORDER — BISACODYL 10 MG RE SUPP: 10.0000 mg | Freq: Every day | RECTAL | Status: DC | PRN

## 2018-10-30 NOTE — Progress Notes (Signed)
23 Days Post-Op Procedure(s) (LRB): AORTIC VALVE REPLACEMENT (AVR) (N/A) TRANSESOPHAGEAL ECHOCARDIOGRAM (TEE) (N/A) Subjective: Continues to have nausea, one episode of vomiting last PM. No abdominal pain. Poor appetite persists.   Objective: Vital signs in last 24 hours: Temp:  [98.3 F (36.8 C)-98.8 F (37.1 C)] 98.6 F (37 C) (08/30 0841) Pulse Rate:  [79-95] 88 (08/30 0841) Cardiac Rhythm: Normal sinus rhythm (08/30 0800) Resp:  [18-24] 24 (08/30 0841) BP: (123-148)/(67-87) 137/83 (08/30 0841) SpO2:  [90 %-96 %] 92 % (08/30 0841) Weight:  [90.3 kg] 90.3 kg (08/30 0500)     Intake/Output from previous day: 08/29 0701 - 08/30 0700 In: 1229.9 [P.O.:200; I.V.:369.9; NG/GT:60; IV Piggyback:600] Out: 5885 [Urine:1775] Intake/Output this shift: Total I/O In: 50 [P.O.:50] Out: 275 [Urine:275]  General appearance: alert, cooperative and mild distress Heart: Regular rate and rhythm. Arlice Colt is an expected systolic murmur c/w the aortic bioprosthetic valve.  Lungs: clear to auscultation bilaterally Abdomen: Mild distension, active bowel sounds. No tenderness.  Extremities: Moderate edema in all extremities. Wound: the sternotomy incision is intact and healing with no sign of complication.   Lab Results: No results for input(s): WBC, HGB, HCT, PLT in the last 72 hours. BMET:  Recent Labs    10/28/18 0350 10/29/18 0556  NA 140 139  K 4.9 4.6  CL 104 102  CO2 27 28  GLUCOSE 128* 129*  BUN 42* 41*  CREATININE 1.71* 1.48*  CALCIUM 8.4* 8.3*    PT/INR: No results for input(s): LABPROT, INR in the last 72 hours. ABG    Component Value Date/Time   PHART 7.430 10/08/2018 0920   HCO3 23.6 10/08/2018 0920   TCO2 25 10/08/2018 0920   ACIDBASEDEF 6.0 (H) 10/08/2018 0307   O2SAT 94.0 10/08/2018 0920   CBG (last 3)  Recent Labs    10/30/18 0010 10/30/18 0410 10/30/18 0839  GLUCAP 133* 135* 101*    Assessment/Plan: S/P Procedure(s) (LRB): AORTIC VALVE REPLACEMENT  (AVR) (N/A) TRANSESOPHAGEAL ECHOCARDIOGRAM (TEE) (N/A)  -POD 23 AVR with a 21 Inspiris bioprosthetic valve for aortic valve endocarditis with MSSA. On IV rifampin and cefazolin through 9/17 via left arm PICC.   -Nutrition- Poor appetite, receiving TF at night. Had another episode of vomiting last PM.  Abdominal exam is benign except for mild distension.  Added low-dose Reglan yesterday.  Will discontinue oral iron supplement and dulcolax. Check TFT's,  -Urinary retention- Foley cath replaced per protocol 8/28 for 224ml residual by bladder scan. UA showed 11-20 wbc/hpf but was nitrate negative. Will send sample for Cx. She is on Flomax.  -AKI-creat continues to trend toward recovery. Monitor.  -Expected acute blood loss anemia. No new labs today but Hct has been stable. Continue Fe+ supplement, recheck CBC in AM..  -DVT PPX-continue Lovenox qhs  -Hypothyroidism- receiving replacement. Check TFT's today.  -Deconditioned state associated with prolonged illness- Continue PT/OT.    LOS: 29 days    Antony Odea, [place] [phone] 10/30/2018

## 2018-10-31 ENCOUNTER — Inpatient Hospital Stay (HOSPITAL_COMMUNITY): Payer: Medicaid Other

## 2018-10-31 LAB — URINE CULTURE

## 2018-10-31 LAB — GLUCOSE, CAPILLARY
Glucose-Capillary: 121 mg/dL — ABNORMAL HIGH (ref 70–99)
Glucose-Capillary: 127 mg/dL — ABNORMAL HIGH (ref 70–99)
Glucose-Capillary: 135 mg/dL — ABNORMAL HIGH (ref 70–99)
Glucose-Capillary: 90 mg/dL (ref 70–99)
Glucose-Capillary: 96 mg/dL (ref 70–99)
Glucose-Capillary: 98 mg/dL (ref 70–99)

## 2018-10-31 LAB — T3, FREE: T3, Free: 2.3 pg/mL (ref 2.0–4.4)

## 2018-10-31 LAB — T4: T4, Total: 5.8 ug/dL (ref 4.5–12.0)

## 2018-10-31 NOTE — Progress Notes (Signed)
Occupational Therapy Treatment Patient Details Name: Audrey Collier MRN: [id number] DOB: September 18, 1960 Today's Date: 10/31/2018    History of present illness 58 yo female presented with fever, vomiting, weakness.  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  Echo showed vegetation.  Transferred to ICU 8/04 due to respiratory failure.  Underwent AVR with TEE intraoprative on 10/07/18. Extubated 10/08/18.   OT comments  Patient supine in bed and requires max encouragement to participate in session.  Seen with PT.  She required min-mod assist for bed mobility +2 assist, motivation to return supine only requires min assist for LB support.  Engaged in UE exercises for strengthening and edema reduction but limited participation and requires multimodal cueing and AAROM at times to increased range. Encouraged pt to engage in hand and ankle pumps throughout the day, continued education on edema reduction. Downgraded goals. Will follow acutely, continue to recommend SNF.    Follow Up Recommendations  SNF;Supervision/Assistance - 24 hour    Equipment Recommendations  3 in 1 bedside commode    Recommendations for Other Services      Precautions / Restrictions Precautions Precautions: Fall;Sternal Restrictions Weight Bearing Restrictions: Yes Other Position/Activity Restrictions: sternal precautions        Mobility Bed Mobility Overal bed mobility: Needs Assistance Bed Mobility: Supine to Sit;Sit to Supine     Supine to sit: Mod assist;+2 for physical assistance;+2 for safety/equipment;HOB elevated Sit to supine: Min assist;+2 for safety/equipment   General bed mobility comments: mod assist for trunk support to EOB but pt able to initate movement of LEs, returned to supine with guarding support for trunk and min assist for LEs (increased motivation to return supine)  Transfers                 General transfer comment: deferred today    Balance Overall balance assessment: Needs  assistance Sitting-balance support: No upper extremity supported;Feet supported Sitting balance-Leahy Scale: Fair Sitting balance - Comments: min guard to min assist, preference to UE support                                    ADL either performed or assessed with clinical judgement   ADL Overall ADL's : Needs assistance/impaired                     Lower Body Dressing: Total assistance Lower Body Dressing Details (indicate cue type and reason): total assist to don socks bed level             Functional mobility during ADLs: Moderate assistance;+2 for physical assistance;+2 for safety/equipment(limited to EOB) General ADL Comments: session limited to EOB only (pt declining further mobility and transport arriving as well)     Vision       Perception     Praxis      Cognition Arousal/Alertness: Lethargic Behavior During Therapy: Flat affect Overall Cognitive Status: Impaired/Different from baseline Area of Impairment: Attention;Following commands;Safety/judgement;Problem solving;Awareness                   Current Attention Level: Selective   Following Commands: Follows one step commands inconsistently;Follows one step commands with increased time   Awareness: Intellectual Problem Solving: Slow processing;Requires verbal cues;Requires tactile cues;Decreased initiation;Difficulty sequencing General Comments: per chart, history of cognitive deficits (developmental delays) at baseline        Exercises Exercises: General Upper Extremity;Other exercises General Exercises - Upper  Extremity Shoulder Flexion: AAROM;5 reps;Both;Seated Shoulder Extension: AAROM;Both;5 reps;Seated Elbow Flexion: AAROM;Both;5 reps;Supine Elbow Extension: AAROM;Both;5 reps;Supine Wrist Flexion: AAROM;Both;5 reps;Supine Wrist Extension: AAROM;Both;5 reps;Supine Digit Composite Flexion: AROM;Both;5 reps;Supine Composite Extension: AROM;Both;5 reps;Supine Other  Exercises Other Exercises: dynamic balance cross body reaching x 5 reps each UE (AAROM at times due to edema and weakness)   Shoulder Instructions       General Comments VSS    Pertinent Vitals/ Pain       Pain Assessment: Faces Faces Pain Scale: Hurts a little bit Pain Location: abdomen Pain Descriptors / Indicators: Discomfort;Grimacing Pain Intervention(s): Limited activity within patient's tolerance;Monitored during session;Repositioned  Home Living                                          Prior Functioning/Environment              Frequency  Min 2X/week        Progress Toward Goals  OT Goals(current goals can now be found in the care plan section)  Progress towards OT goals: Not progressing toward goals - comment;Goals drowngraded-see care plan  Acute Rehab OT Goals Patient Stated Goal: none stated today ADL Goals Pt Will Perform Lower Body Dressing: with max assist;with adaptive equipment;sit to/from stand  Plan Discharge plan remains appropriate;Frequency remains appropriate    Co-evaluation    PT/OT/SLP Co-Evaluation/Treatment: Yes Reason for Co-Treatment: Necessary to address cognition/behavior during functional activity;For patient/therapist safety;To address functional/ADL transfers   OT goals addressed during session: ADL's and self-care      AM-PAC OT "6 Clicks" Daily Activity     Outcome Measure   Help from another person eating meals?: A Little Help from another person taking care of personal grooming?: A Lot Help from another person toileting, which includes using toliet, bedpan, or urinal?: A Lot Help from another person bathing (including washing, rinsing, drying)?: A Lot Help from another person to put on and taking off regular upper body clothing?: A Lot Help from another person to put on and taking off regular lower body clothing?: Total 6 Click Score: 12    End of Session Equipment Utilized During Treatment:  Oxygen  OT Visit Diagnosis: Other abnormalities of gait and mobility (R26.89);Muscle weakness (generalized) (M62.81);Pain Pain - part of body: (stomach)   Activity Tolerance Patient limited by fatigue   Patient Left in bed;with call bell/phone within reach;with bed alarm set;with nursing/sitter in room   Nurse Communication Mobility status        Time: [id number] OT Time Calculation (min): 23 min  Charges: OT General Charges $OT Visit: 1 Visit OT Treatments $Therapeutic Exercise: 8-22 mins  Chancy Milroyhristie S Verginia Toohey, OT Acute Rehabilitation Services Pager [phone] Office [phone]    Chancy MilroyChristie S Alexsia Klindt 10/31/2018, 11:22 AM

## 2018-10-31 NOTE — Consult Note (Signed)
[facility] Consult: 9:26 AM 10/31/2018  LOS: 30 days    Referring Provider: Dr Kipp Brood  Primary Care Physician:  Jani Gravel, MD Primary Gastroenterologist:  Dr. Barney Drain    Reason for Consultation:  Anorexia.     HPI: Audrey Collier is a 58 y.o. female.  Hx cognitive delay.  Hypothyroidism. Endometrial polyp, postmenopausal bleeding.  04/2014 D&C, hysteroscopy, endometrial ablation, endometrial polypectomy.  IDA.  IBS-C. Constipation treated with Linzess and Amitiza in the past. 11/2013 colonoscopy redundant left colon.  Moderate internal hemorrhoids no source for IDA. 11/2013 EGD.  For IDA.  Mild, nonerosive gastritis.  No GI source for IDA identified.  Will need capsule endoscopy. Gastric biopsy showed helical pylori gastritis.  Duodenal biopsies showed benign SB mucosa, no villous atrophy, inflammation, abnormalities. The H. pylori was treated with Amoxil, Biaxin, PPI for 10 days.   Do not find any record of capsule endoscopy.    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 Issues during hospitalization include new thrombocytopenia (nadir 32 K, 1 packet platelets 10/07/18), normocytic anemia (Hgb nadir 6.1, MCV 90s, s/p PRBC x 2), elevated LFTs attributed to sepsis/hypotension (chest CT showed incomplete gallbladder view but it appeared distended with possible GB wall thickening.  Trace perihepatic ascites., AKI, pressure injury of the right ear.  EF 60-35% on presurgical TEE.   Has been on PPI for bulk of admission.   Core track feeding tube placed 10/21/2018.  Continuous tube feedings.  Pt says she is having pain in her left abdomen.  Appetite is good at home but not now.  Denies nausea.  Family history notable for hypertension and diabetes.     Past Medical History:  Diagnosis Date  . Anxiety   . Dizziness   . Endometrial polyp 03/26/2014  . Fecal occult blood test positive 03/15/2013   will send 3 cards home and refer to GI for colonoscopy  . GERD (gastroesophageal reflux disease)   . Hypothyroidism   . Mental disorder    mentally challenged  . PMB (postmenopausal bleeding) 03/26/2014  . Thickened endometrium 03/26/2014   ?polyp will get HSG    Past Surgical History:  Procedure Laterality Date  . AORTIC VALVE REPLACEMENT N/A 10/07/2018   Procedure: AORTIC VALVE REPLACEMENT (AVR);  Surgeon: Lajuana Matte, MD;  Location: [facility];  Service: Open Heart Surgery;  Laterality: N/A;  . CATARACT EXTRACTION W/PHACO Left 08/06/2017   Procedure: CATARACT EXTRACTION PHACO AND INTRAOCULAR LENS PLACEMENT LEFT EYE;  Surgeon: Baruch Goldmann, MD;  Location: [facility];  Service: Ophthalmology;  Laterality: Left;  CDE: 12.63  . CATARACT EXTRACTION W/PHACO Right 10/01/2017   Procedure: CATARACT EXTRACTION PHACO AND INTRAOCULAR LENS PLACEMENT (IOC);  Surgeon: Baruch Goldmann, MD;  Location: [facility];  Service: Ophthalmology;  Laterality: Right;  CDE: 5.77  . COLONOSCOPY N/A 12/01/2013   Dr. Fields:moderate sized internal hemorrhoids/left colon is redundant  . [place] N/A 04/18/2014   Procedure: DILATATION & CURETTAGE/HYSTEROSCOPY WITH NOVASURE ENDOMETRIAL ABLATION;  Surgeon: Toula Moos  Chriss Driver, MD;  Location: [facility];  Service: Gynecology;  Laterality: N/A;  Uterine Cavity Length 5cm     . ESOPHAGOGASTRODUODENOSCOPY N/A 12/01/2013   ZOX:WRUE non erosive gastritis. +H.pylori   . NO PAST SURGERIES    . POLYPECTOMY N/A 04/18/2014   Procedure: ENDOMETRIAL POLYPECTOMY;  Surgeon: Florian Buff, MD;  Location: [facility];  Service: Gynecology;  Laterality: N/A;  . TEE WITHOUT CARDIOVERSION N/A 10/07/2018   Procedure: TRANSESOPHAGEAL ECHOCARDIOGRAM (TEE);  Surgeon: Lajuana Matte, MD;  Location: [place];  Service: Open  Heart Surgery;  Laterality: N/A;    Prior to Admission medications   Medication Sig Start Date End Date Taking? Authorizing Provider  Calcium Citrate-Vitamin D (CALCIUM + D PO) Take 1 tablet by mouth daily.   Yes [provider]  cetirizine (ZYRTEC) 10 MG tablet Take 10 mg by mouth daily.   Yes [provider]  diclofenac sodium (VOLTAREN) 1 % GEL Apply 1 application topically every 6 (six) hours as needed (heel pain).   Yes [provider]  docusate sodium (STOOL SOFTENER) 100 MG capsule Take 100 mg by mouth every Monday, Wednesday, and Friday.   Yes [provider]  FLUoxetine (PROZAC) 10 MG tablet Take 10 mg by mouth at bedtime.    Yes [provider]  fluticasone (FLONASE) 50 MCG/ACT nasal spray Place 1 spray into both nostrils 2 (two) times daily as needed for allergies.    Yes [provider]  levothyroxine (SYNTHROID) 125 MCG tablet Take 125 mcg by mouth daily before breakfast.    Yes [provider]  montelukast (SINGULAIR) 10 MG tablet Take 10 mg by mouth at bedtime.   Yes [provider]  Multiple Vitamin (MULTIVITAMIN) tablet Take 1 tablet by mouth daily.   Yes [provider]  omeprazole (PRILOSEC) 20 MG capsule TAKE 1 CAPSULE BY MOUTH ONCE DAILY. 07/23/15  Yes Annitta Needs, NP  rosuvastatin (CRESTOR) 5 MG tablet Take 5 mg by mouth daily.   Yes [provider]  aspirin EC 325 MG EC tablet Take 1 tablet (325 mg total) by mouth daily. 10/21/18   Nani Skillern, PA-C  ceFAZolin (ANCEF) IVPB Inject 2 g into the vein every 8 (eight) hours. Indication: MSSA endocarditis Last Day of Therapy:  11/17/18 Labs - Once weekly:  CBC/D and BMP, Labs - Every other week:  ESR and CRP 10/20/18 11/25/18  Lars Pinks M, PA-C  ferrous sulfate 325 (65 FE) MG tablet Take 1 tablet (325 mg total) by mouth daily with breakfast. For one month then stop. 10/21/18   Nani Skillern, PA-C  metolazone  (ZAROXOLYN) 5 MG tablet Take 1 tablet (5 mg total) by mouth daily. 10/21/18   Nani Skillern, PA-C  metoprolol tartrate (LOPRESSOR) 25 MG tablet Take 1 tablet (25 mg total) by mouth 2 (two) times daily. 10/20/18   Nani Skillern, PA-C  potassium chloride SA (K-DUR) 20 MEQ tablet Take 1 tablet (20 mEq total) by mouth daily. 10/21/18   Nani Skillern, PA-C    Scheduled Meds: . aspirin EC  325 mg Oral Daily   Or  . aspirin  324 mg Per Tube Daily  . calcium-vitamin D  1 tablet Oral Q breakfast  . Chlorhexidine Gluconate Cloth  6 each Topical Daily  . enoxaparin (LOVENOX) injection  40 mg Subcutaneous QHS  . feeding supplement (OSMOLITE 1.5 CAL)  1,000 mL Per Tube Q24H  . feeding supplement (PRO-STAT SUGAR FREE 64)  30 mL  Per Tube BID  . FLUoxetine  10 mg Oral QHS  . levothyroxine  125 mcg Oral Q0600  . mouth rinse  15 mL Mouth Rinse BID  . metoprolol tartrate  25 mg Oral BID  . montelukast  10 mg Oral QHS  . multivitamin with minerals  1 tablet Oral Daily  . pantoprazole  40 mg Oral Daily  . rifampin  300 mg Oral Q8H  . rosuvastatin  5 mg Oral q1800  . sodium chloride flush  10-40 mL Intracatheter Q12H  . sodium chloride flush  3 mL Intravenous Q12H  . tamsulosin  0.4 mg Oral Daily   Infusions: . sodium chloride 500 mL (10/24/18 2031)  .  ceFAZolin (ANCEF) IV 2 g (10/31/18 0635)   PRN Meds: sodium chloride, bisacodyl **OR** bisacodyl, metoprolol tartrate, morphine injection, ondansetron (ZOFRAN) IV, oxyCODONE, sodium chloride flush, sodium chloride flush, traMADol   Allergies as of 10/01/2018  . (No Known Allergies)    Family History  Problem Relation Age of Onset  . Diabetes Maternal Aunt   . Hypertension Maternal Aunt   . Diabetes Maternal Uncle   . Colon cancer Neg Hx     Social History   Socioeconomic History  . Marital status: Single    Spouse name: Not on file  . Number of children: Not on file  . Years of education: Not on file  . Highest  education level: Not on file  Occupational History  . Not on file  Social Needs  . Financial resource strain: Not on file  . Food insecurity    Worry: Not on file    Inability: Not on file  . Transportation needs    Medical: Not on file    Non-medical: Not on file  Tobacco Use  . Smoking status: Never Smoker  . Smokeless tobacco: Never Used  . Tobacco comment: Never smoked  Substance and Sexual Activity  . Alcohol use: No    Alcohol/week: 0.0 standard drinks  . Drug use: No  . Sexual activity: Never  Lifestyle  . Physical activity    Days per week: Not on file    Minutes per session: Not on file  . Stress: Not on file  Relationships  . Social Herbalist on phone: Not on file    Gets together: Not on file    Attends religious service: Not on file    Active member of club or organization: Not on file    Attends meetings of clubs or organizations: Not on file    Relationship status: Not on file  . Intimate partner violence    Fear of current or ex partner: Not on file    Emotionally abused: Not on file    Physically abused: Not on file    Forced sexual activity: Not on file  Other Topics Concern  . Not on file  Social History Narrative  . Not on file    REVIEW OF SYSTEMS: Constitutional: No fatigue, no weakness. ENT:  No nose bleeds Pulm: Denies shortness of breath and cough. CV:  No palpitations, no LE edema.  Denies sternal discomfort. GU:  No hematuria, no frequency GI: Denies dysphagia. Heme: No history of unusual or excessive bleeding or bruising. Transfusions: Per HPI. Neuro:  No headaches, no peripheral tingling or numbness.  Seizures, no syncope. Derm:  No itching, no rash or sores.  Endocrine:  No sweats or chills.  No polyuria or dysuria Immunization: None known. Travel: In [facility] prior  to becoming ill.   PHYSICAL EXAM: Vital signs in last 24 hours: Vitals:   10/31/18 0814 10/31/18 0821  BP:  (!) 141/70  Pulse:  94  Resp: (!) 25  20  Temp: 98.8 F (37.1 C) 98.6 F (37 C)  SpO2: 100% 93%   Wt Readings from Last 3 Encounters:  10/31/18 90.7 kg  10/01/17 72.6 kg  08/03/17 76.4 kg    General: Pleasant, somewhat pale, alert WF.  Does not look ill. Head: No facial asymmetry or swelling.  No signs of head trauma. Eyes: No scleral icterus.  Conjunctiva pale. Ears: Not hard of hearing Nose: No congestion or discharge. Mouth: No teeth.  Tongue pigmented with a rusty color.  No lesions, no blood. Neck: No JVD, no masses, no thyromegaly. Lungs: Some fine crackles in the right base otherwise clear.  Diminished breath sounds but poor inspiratory effort.  No cough, no dyspnea. Heart: Intact, healthy appearing sternal scar. Abdomen: Soft.  Not tender.  Not distended.  Bowel sounds very hypoactive but none tinkling or tympanitic or high-pitched..   Rectal: Deferred. Musc/Skeltl: No joint redness or swelling.  No gross joint deformities. Extremities: Anasarca with swelling of the upper and lower extremities.  Slight pitting in the lower legs. Neurologic: Alert.  Appropriate.  Oriented to self and hospital.  Not to date or exact place/time Skin: No rash, no telangiectasia, no suspicious lesions. Tattoos: None observed Nodes: No cervical adenopathy. Psych: Cooperative, calm, pleasant.  Intake/Output from previous day: 08/30 0701 - 08/31 0700 In: 104 [P.O.:50; NG/GT:54] Out: 1300 [Urine:1300] Intake/Output this shift: Total I/O In: -  Out: 300 [Urine:300]  LAB RESULTS: No results for input(s): WBC, HGB, HCT, PLT in the last 72 hours. BMET Lab Results  Component Value Date   NA 139 10/29/2018   NA 140 10/28/2018   NA 139 10/26/2018   K 4.6 10/29/2018   K 4.9 10/28/2018   K 5.1 10/26/2018   CL 102 10/29/2018   CL 104 10/28/2018   CL 103 10/26/2018   CO2 28 10/29/2018   CO2 27 10/28/2018   CO2 28 10/26/2018   GLUCOSE 129 (H) 10/29/2018   GLUCOSE 128 (H) 10/28/2018   GLUCOSE 124 (H) 10/26/2018   BUN 41 (H)  10/29/2018   BUN 42 (H) 10/28/2018   BUN 36 (H) 10/26/2018   CREATININE 1.48 (H) 10/29/2018   CREATININE 1.71 (H) 10/28/2018   CREATININE 1.55 (H) 10/26/2018   CALCIUM 8.3 (L) 10/29/2018   CALCIUM 8.4 (L) 10/28/2018   CALCIUM 8.3 (L) 10/26/2018   LFT No results for input(s): PROT, ALBUMIN, AST, ALT, ALKPHOS, BILITOT, BILIDIR, IBILI in the last 72 hours. PT/INR Lab Results  Component Value Date   INR 1.7 (H) 10/07/2018   INR 1.4 (H) 10/01/2018   Hepatitis Panel No results for input(s): HEPBSAG, HCVAB, HEPAIGM, HEPBIGM in the last 72 hours. C-Diff No components found for: CDIFF Lipase  No results found for: LIPASE  Drugs of Abuse  No results found for: LABOPIA, COCAINSCRNUR, LABBENZ, AMPHETMU, THCU, LABBARB   RADIOLOGY STUDIES: No results found.    IMPRESSION:   *    Persistent anorexia.  It may be that with the continuous tube feeds she does not have a chance to develop hunger. Gives a history of left abdominal pain associated with eating but is nontender on exam.  Could have gastritis/duodenitis versus colitis (ischemic especially suspected given recent events of sepsis, hypotension).    *    Transfusion requiring anemia.  Hgb initiall  12.9 >> 10.5 after initial hydration but droppin to 6s, 7s post op. 2 U PRBC thus far.  FOBT negative 10/16/2018. Normocytic anemia.  Hx IDA 2015, possibly due to dysfunctional uterine bleeding. Mild gastritis, unremarkable colonoscopy in 2015.  *     MSSA aortic valve endocarditis.  [facility] AVR 7/20/T..  *      Thrombocytopenia.  Platelets have recovered from nadir 32, now in 200s-300s  *      Hypothyroidism with elevated TSH at 19.8.  T4 normal at 5.8.    PLAN:     *   Dr Tarri Glenn to see pt EGD tomorrow?    *     If percutaneous gastric tube needed, IR places these.  *     Consider switching to nocturnal tube feeding.  Allow daytime non-tube feeding to see what happens with her appetite.  Azucena Freed  10/31/2018, 9:26 AM Phone  [phone]

## 2018-10-31 NOTE — Progress Notes (Signed)
IR consulted for gastrostomy tube placement at the request of TCTS in patient with poor appetite and intake.   Brief review of history shows that patient was eating well prior to admission.  She was admitted 8/1 for sepsis secondary to bilateral pneumonia.  She was also found to have endocarditis now s/p aortic valve replacement 10/07/18.  Intake declined since admission. Currently has an NGT in place and receiving nocturnal tube feeding.   CT Abdomen obtained for review of gastric anatomy for possible tube placement.   As read by Dr. Anselm Pancoast: 1. Anatomy should be amendable for percutaneous gastrostomy tube placement. Nasogastric tube tip in the distal stomach body region. 2. Perihepatic fluid collections with at least mild gallbladder distension. One perihepatic collection appears to be contiguous with the gallbladder and cannot exclude gallbladder inflammation. Recommend further characterization of the gallbladder and perihepatic space with a right upper quadrant ultrasound. 3. Right pleural effusion with compressive atelectasis in the right lower lobe. Poorly characterized patchy densities in left lower lobe due to motion artifact but concerning for residual inflammation or infection in left lower lobe.  Discussed with GI PA prior to moving forward.  Have also reached out to TCTS PA.  IR will follow and remains available if gastrostomy tube is, in fact, warranted. Await further evaluation at this time.   Brynda Greathouse, MS RD PA-C 4:46 PM

## 2018-10-31 NOTE — Progress Notes (Signed)
24 Days Post-Op Procedure(s) (LRB): AORTIC VALVE REPLACEMENT (AVR) (N/A) TRANSESOPHAGEAL ECHOCARDIOGRAM (TEE) (N/A) Subjective: Awake and alert, says nausea better but still has no appetite. Had a BM yesterday that was "normal". Received TF overnight as ordered but not recorded in I&O.  Objective: Vital signs in last 24 hours: Temp:  [98 F (36.7 C)-98.8 F (37.1 C)] 98.8 F (37.1 C) (08/31 0814) Pulse Rate:  [78-97] 81 (08/31 0400) Cardiac Rhythm: Normal sinus rhythm (08/30 2059) Resp:  [20-25] 25 (08/31 0814) BP: (117-145)/(70-83) 145/73 (08/31 0400) SpO2:  [92 %-100 %] 100 % (08/31 0814) Weight:  [90.7 kg] 90.7 kg (08/31 0413)   Intake/Output from previous day: 08/30 0701 - 08/31 0700 In: 104 [P.O.:50; NG/GT:54] Out: 1300 [Urine:1300] Intake/Output this shift: No intake/output data recorded.  General appearance:alert, cooperative and no distress Heart:Regular rate and rhythm. Arlice Colt is an expected systolic murmur c/w the aortic bioprosthetic valve. Lungs:clear to auscultation bilaterally Abdomen:Mild distension that is unchanged, active bowel sounds. No tenderness. Extremities:Moderate edema in all extremities.  PICC insertion site left arm has no complicating features.  Wound:the sternotomy incision is intact and healing with no sign of complication.  Lab Results: No results for input(s): WBC, HGB, HCT, PLT in the last 72 hours. BMET:  Recent Labs    10/29/18 0556  NA 139  K 4.6  CL 102  CO2 28  GLUCOSE 129*  BUN 41*  CREATININE 1.48*  CALCIUM 8.3*    PT/INR: No results for input(s): LABPROT, INR in the last 72 hours. ABG    Component Value Date/Time   PHART 7.430 10/08/2018 0920   HCO3 23.6 10/08/2018 0920   TCO2 25 10/08/2018 0920   ACIDBASEDEF 6.0 (H) 10/08/2018 0307   O2SAT 94.0 10/08/2018 0920   CBG (last 3)  Recent Labs    10/30/18 2035 10/31/18 0006 10/31/18 0406  GLUCAP 87 127* 135*    Assessment/Plan: S/P Procedure(s)  (LRB): AORTIC VALVE REPLACEMENT (AVR) (N/A) TRANSESOPHAGEAL ECHOCARDIOGRAM (TEE) (N/A)  -POD 24 AVR with a 21 Inspiris bioprosthetic valve for aortic valve endocarditis with MSSA. On IV rifampin and cefazolin through 9/17 via left arm PICC.   -Nutrition- Poor appetite with no improvement, receiving TF at night via Cortrak. She will require a PEG feeding tube. Will consult GI.  -Nausea improved, no further vomiting over past 24 hours. Abdominal exam remains benign except for mild distension. Discontinued oral iron supplement and dulcolax yesterday.   -Urinary retention- Foley cath replaced per protocol 8/28 for 222ml residual by bladder scan. UA showed 11-20 wbc/hpf but was nitrate- negative. Urine CX is pending.  She is on Flomax.  -AKI-creat has been stable. No new lab today. Recheck in AM  -Expected acute blood loss anemia. No new labs today but Hct has been stable. Continue Fe+ supplement, recheck CBC in AM..  -DVT PPX-continue Lovenox qhs   LOS: 30 days    Antony Odea , PA-C 10/31/2018

## 2018-10-31 NOTE — Progress Notes (Signed)
Physical Therapy Treatment Patient Details Name: Audrey Collier MRN: [id number] DOB: 05/03/1960 Today's Date: 10/31/2018    History of Present Illness 58 yo female presented with fever, vomiting, weakness.  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  Echo showed vegetation.  Transferred to ICU 8/04 due to respiratory failure.  Underwent AVR with TEE intraoprative on 10/07/18. Extubated 10/08/18.    PT Comments    Pt continues to require maximal verbal encouragement to participate in therapy, despite reporting she felt better after last therapy session. Pt agreeable to UE exercise for edema reduction. She then was amenable  to sit EoB for LE exercise. Progression to OOB mobility was limited by arrival of transporter to take pt to CT. D/c plans remain appropriate. PT will continue to follow acutely.     Follow Up Recommendations  SNF     Equipment Recommendations  Other (comment)(TBD)       Precautions / Restrictions Precautions Precautions: Fall;Sternal Restrictions Weight Bearing Restrictions: Yes Other Position/Activity Restrictions: sternal precautions     Mobility  Bed Mobility Overal bed mobility: Needs Assistance Bed Mobility: Supine to Sit;Sit to Supine     Supine to sit: Mod assist;+2 for physical assistance;+2 for safety/equipment;HOB elevated Sit to supine: Min assist;+2 for safety/equipment   General bed mobility comments: mod assist for trunk support to EOB but pt able to initate movement of LEs, returned to supine with guarding support for trunk and min assist for LEs (increased motivation to return supine)  Transfers                 General transfer comment: deferred today        Balance Overall balance assessment: Needs assistance Sitting-balance support: No upper extremity supported;Feet supported Sitting balance-Leahy Scale: Fair Sitting balance - Comments: min guard to min assist, preference to UE support                                      Cognition Arousal/Alertness: Lethargic Behavior During Therapy: Flat affect Overall Cognitive Status: Impaired/Different from baseline Area of Impairment: Attention;Following commands;Safety/judgement;Problem solving;Awareness                   Current Attention Level: Selective   Following Commands: Follows one step commands inconsistently;Follows one step commands with increased time   Awareness: Intellectual Problem Solving: Slow processing;Requires verbal cues;Requires tactile cues;Decreased initiation;Difficulty sequencing General Comments: per chart, history of cognitive deficits (developmental delays) at baseline      Exercises General Exercises - Upper Extremity Shoulder Flexion: AAROM;5 reps;Both;Seated Shoulder Extension: AAROM;Both;5 reps;Seated Elbow Flexion: AAROM;Both;5 reps;Supine Elbow Extension: AAROM;Both;5 reps;Supine Wrist Flexion: AAROM;Both;5 reps;Supine Wrist Extension: AAROM;Both;5 reps;Supine Digit Composite Flexion: AROM;Both;5 reps;Supine Composite Extension: AROM;Both;5 reps;Supine General Exercises - Lower Extremity Ankle Circles/Pumps: AROM;AAROM;20 reps;Seated Long Arc Quad: AROM;Both;10 reps;Seated Hip Flexion/Marching: AAROM;Both;10 reps;Seated Other Exercises Other Exercises: dynamic balance cross body reaching x 5 reps each UE (AAROM at times due to edema and weakness)    General Comments General comments (skin integrity, edema, etc.): VSS      Pertinent Vitals/Pain Pain Assessment: Faces Faces Pain Scale: Hurts a little bit Pain Location: abdomen Pain Descriptors / Indicators: Discomfort;Grimacing Pain Intervention(s): Limited activity within patient's tolerance;Monitored during session;Repositioned           PT Goals (current goals can now be found in the care plan section) Acute Rehab PT Goals Patient Stated Goal: none stated today PT Goal  Formulation: With patient Time For Goal Achievement:  11/01/18 Potential to Achieve Goals: Fair Progress towards PT goals: Progressing toward goals    Frequency    Min 2X/week      PT Plan Current plan remains appropriate    Co-evaluation PT/OT/SLP Co-Evaluation/Treatment: Yes Reason for Co-Treatment: Necessary to address cognition/behavior during functional activity PT goals addressed during session: Strengthening/ROM;Mobility/safety with mobility OT goals addressed during session: ADL's and self-care      AM-PAC PT "6 Clicks" Mobility   Outcome Measure  Help needed turning from your back to your side while in a flat bed without using bedrails?: A Little Help needed moving from lying on your back to sitting on the side of a flat bed without using bedrails?: A Lot Help needed moving to and from a bed to a chair (including a wheelchair)?: A Lot Help needed standing up from a chair using your arms (e.g., wheelchair or bedside chair)?: A Lot Help needed to walk in hospital room?: Total Help needed climbing 3-5 steps with a railing? : Total 6 Click Score: 11    End of Session   Activity Tolerance: Patient tolerated treatment well Patient left: in bed(transporter in room) Nurse Communication: Mobility status PT Visit Diagnosis: Muscle weakness (generalized) (M62.81);Other abnormalities of gait and mobility (R26.89);Pain Pain - part of body: (generalized)     Time: 0937-1000 PT Time Calculation (min) (ACUTE ONLY): 23 min  Charges:  $Therapeutic Exercise: 8-22 mins                     Cesare Sumlin B. Beverely RisenVan Fleet PT, DPT Acute Rehabilitation Services Pager [phone] Office [phone]    [name] 10/31/2018, 1:20 PM

## 2018-11-01 ENCOUNTER — Inpatient Hospital Stay (HOSPITAL_COMMUNITY): Payer: Medicaid Other

## 2018-11-01 DIAGNOSIS — F5000 Anorexia nervosa, unspecified: Secondary | ICD-10-CM

## 2018-11-01 DIAGNOSIS — R932 Abnormal findings on diagnostic imaging of liver and biliary tract: Secondary | ICD-10-CM

## 2018-11-01 LAB — COMPREHENSIVE METABOLIC PANEL
ALT: 8 U/L (ref 0–44)
AST: 39 U/L (ref 15–41)
Albumin: 1.6 g/dL — ABNORMAL LOW (ref 3.5–5.0)
Alkaline Phosphatase: 192 U/L — ABNORMAL HIGH (ref 38–126)
Anion gap: 11 (ref 5–15)
BUN: 41 mg/dL — ABNORMAL HIGH (ref 6–20)
CO2: 27 mmol/L (ref 22–32)
Calcium: 8.5 mg/dL — ABNORMAL LOW (ref 8.9–10.3)
Chloride: 102 mmol/L (ref 98–111)
Creatinine, Ser: 1.65 mg/dL — ABNORMAL HIGH (ref 0.44–1.00)
GFR calc Af Amer: 40 mL/min — ABNORMAL LOW (ref >60)
GFR calc non Af Amer: 34 mL/min — ABNORMAL LOW (ref >60)
Glucose, Bld: 102 mg/dL — ABNORMAL HIGH (ref 70–99)
Potassium: 4.2 mmol/L (ref 3.5–5.1)
Sodium: 140 mmol/L (ref 135–145)
Total Bilirubin: 1 mg/dL (ref 0.3–1.2)
Total Protein: 5.4 g/dL — ABNORMAL LOW (ref 6.5–8.1)

## 2018-11-01 LAB — CBC
HCT: 25.2 % — ABNORMAL LOW (ref 36.0–46.0)
Hemoglobin: 7.6 g/dL — ABNORMAL LOW (ref 12.0–15.0)
MCH: 29.2 pg (ref 26.0–34.0)
MCHC: 30.2 g/dL (ref 30.0–36.0)
MCV: 96.9 fL (ref 80.0–100.0)
Platelets: 145 10*3/uL — ABNORMAL LOW (ref 150–400)
RBC: 2.6 MIL/uL — ABNORMAL LOW (ref 3.87–5.11)
RDW: 17.4 % — ABNORMAL HIGH (ref 11.5–15.5)
WBC: 6 10*3/uL (ref 4.0–10.5)
nRBC: 0 % (ref 0.0–0.2)

## 2018-11-01 LAB — GLUCOSE, CAPILLARY
Glucose-Capillary: 102 mg/dL — ABNORMAL HIGH (ref 70–99)
Glucose-Capillary: 103 mg/dL — ABNORMAL HIGH (ref 70–99)
Glucose-Capillary: 117 mg/dL — ABNORMAL HIGH (ref 70–99)
Glucose-Capillary: 144 mg/dL — ABNORMAL HIGH (ref 70–99)
Glucose-Capillary: 99 mg/dL (ref 70–99)

## 2018-11-01 LAB — LIPASE, BLOOD: Lipase: 39 U/L (ref 11–51)

## 2018-11-01 MED ORDER — MORPHINE SULFATE (PF) 4 MG/ML IV SOLN
INTRAVENOUS | Status: AC
  Administered 2018-11-01: 17:00:00 3 mg
  Filled 2018-11-01: qty 1

## 2018-11-01 MED ORDER — MORPHINE BOLUS VIA INFUSION: 3.0000 mg | Freq: Once | INTRAVENOUS | Status: DC

## 2018-11-01 MED ORDER — TECHNETIUM TC 99M MEBROFENIN IV KIT
5.0000 | PACK | Freq: Once | INTRAVENOUS | Status: AC | PRN
  Administered 2018-11-01: 5 via INTRAVENOUS

## 2018-11-01 MED ORDER — MORPHINE SULFATE (PF) 4 MG/ML IV SOLN
3.0000 mg | Freq: Once | INTRAVENOUS | Status: AC
  Administered 2018-11-05: 3 mg via INTRAVENOUS
  Filled 2018-11-01 (×2): qty 1

## 2018-11-01 MED ORDER — OSMOLITE 1.5 CAL PO LIQD
1000.0000 mL | ORAL | Status: DC
  Administered 2018-11-01 – 2018-11-02 (×2): 1000 mL
  Filled 2018-11-01 (×5): qty 1000

## 2018-11-01 NOTE — Progress Notes (Signed)
Nutrition Follow-up  DOCUMENTATION CODES:   Not applicable  INTERVENTION:    Continue Magic cup TID with meals, each supplement provides 290 kcal and 9 grams of protein  Continue snack daily   Increase tube feeding:  -Osmolite 1.5 @ 65 ml/hr via Cortrak x 10 hours (8pm-6am) -30 ml Prostat BID  Provides: 1175 kcals, 71 grams protein, 495 ml free water. Meets 65% kcal and 71% of protein needs.   NUTRITION DIAGNOSIS:   Inadequate oral intake related to poor appetite as evidenced by meal completion < 50%.  Ongoing  GOAL:   Patient will meet greater than or equal to 90% of their needs  Not meeting PO  MONITOR:   PO intake, Supplement acceptance, Diet advancement, Labs, Weight trends, I & O's, Skin  REASON FOR ASSESSMENT:   Consult Enteral/tube feeding initiation and management  ASSESSMENT:   58 yo female admitted with fever, vomiting, weakness. Found to have MSSA bacteremia with PNA. Echo showed vegetation. Required transfer to the ICU and intubation on 8/4. PMH includes developmental delay, GERD, anxiety, hypothyroidism, HLD.   8/06 - TEE showing large aortic valve vegetation 8/07 - s/p aortic valve replacement 8/08 - extubated, diet advanced to Dysphagia 2 8/21 - Cortrak placement(tip in stomach); spoke with TCTS, ok to start TF  Plan for HIDA to rule out cholecystitis.   Spoke with RN. Pt complaining of abdominal pain and denies nausea. Vomited medications two days ago but has tolerated PO since. Intake remains poor. Meal completions charted as 0-25% for her last six meals. Will increase tube feeding after HIDA. May need to consider continuous feeding over 24 hours to lower rate and to meet 100% of needs with TF alone. Per TCTS, if no obvious cause of inability to eat pt will likely require PEG.   Admission weight: 77.7 kg Current weight: 89 kg (+2 edema BUE and +3 edema BLE continues)   I/O: -6,231 ml since 8/13 UOP: 800 ml x 24 hrs   Medications:  calcium-vit D, colace, ferrous sulfate, MVI with minerals Labs: CBG 90-144  Diet Order:   Diet Order            Diet NPO time specified  Diet effective midnight              EDUCATION NEEDS:   No education needs have been identified at this time  Skin:  Skin Assessment: Skin Integrity Issues: Skin Integrity Issues:: DTI, Incisions, Other (Comment) DTI: R ear Incisions: chest Other: MASD-arm, buttocks, labia  Last BM:  8/31  Height:   Ht Readings from Last 1 Encounters:  10/07/18 5\' 2"  (1.575 m)    Weight:   Wt Readings from Last 1 Encounters:  11/01/18 89 kg    Ideal Body Weight:  56.8 kg  BMI:  Body mass index is 35.89 kg/m.  Estimated Nutritional Needs:   Kcal:  1800-2000  Protein:  100-115 gm  Fluid:  >/= 1.8 L   Mariana Single RD, LDN Clinical Nutrition Pager # [phone]

## 2018-11-01 NOTE — Consult Note (Signed)
[facility] Surgery Consult Note  Audrey Collier 10-Jun-1960  [id number].    Requesting MD: Kipp Brood Chief Complaint/Reason for Consult: Possible cholecystitis  HPI:  Patient is a 58 year old female who presented to the hospital with altered mental status on 10/01/18. Found to be septic from MSSA bacteremia. Intubated 8/4 and TEE showed large aortic valve vegetation. Patient underwent aortic valve replacement with tissue valve by Dr. Kipp Brood on 8/7. She has recovered appropriately from this is seems but was noted to intermittently have some nausea and vomiting and overall poor PO intake. IR/GI were consulted for possible PEG placement and obtained CT and [place] which showed cholelithiasis with possible cholecystitis. Patient is able to tell me that she does not feel well and that she has abdominal pain in her epigastrium. She reports pain is worse with eating and that is why she hasn't been taking in much. She reports some nausea and vomiting but is not able to characterize emesis really. When asked about how long she has had these symptoms, she states she doesn't know. I asked if she felt like this a year ago and she says yes. Patient tells me she lives with "[name]".   PMH otherwise significant for cognitive delay, hypothyroidism, and GERD. No past abdominal surgeries from what information is available. NKDA.   ROS: Review of Systems  Reason unable to perform ROS: ROS limited by mental acuity.  Gastrointestinal: Positive for abdominal pain, nausea and vomiting. Negative for constipation and diarrhea.  Genitourinary: Negative for dysuria, frequency and urgency.    Family History  Problem Relation Age of Onset  . Diabetes Maternal Aunt   . Hypertension Maternal Aunt   . Diabetes Maternal Uncle   . Colon cancer Neg Hx     Past Medical History:  Diagnosis Date  . Anxiety   . Dizziness   . Endometrial polyp 03/26/2014  . Fecal occult blood test positive 03/15/2013   will send 3 cards  home and refer to GI for colonoscopy  . GERD (gastroesophageal reflux disease)   . Hypothyroidism   . Mental disorder    mentally challenged  . PMB (postmenopausal bleeding) 03/26/2014  . Thickened endometrium 03/26/2014   ?polyp will get HSG    Past Surgical History:  Procedure Laterality Date  . AORTIC VALVE REPLACEMENT N/A 10/07/2018   Procedure: AORTIC VALVE REPLACEMENT (AVR);  Surgeon: Lajuana Matte, MD;  Location: [place];  Service: Open Heart Surgery;  Laterality: N/A;  . CATARACT EXTRACTION W/PHACO Left 08/06/2017   Procedure: CATARACT EXTRACTION PHACO AND INTRAOCULAR LENS PLACEMENT LEFT EYE;  Surgeon: Baruch Goldmann, MD;  Location: [facility];  Service: Ophthalmology;  Laterality: Left;  CDE: 12.63  . CATARACT EXTRACTION W/PHACO Right 10/01/2017   Procedure: CATARACT EXTRACTION PHACO AND INTRAOCULAR LENS PLACEMENT (IOC);  Surgeon: Baruch Goldmann, MD;  Location: [facility];  Service: Ophthalmology;  Laterality: Right;  CDE: 5.77  . COLONOSCOPY N/A 12/01/2013   Dr. Fields:moderate sized internal hemorrhoids/left colon is redundant  . DILITATION & CURRETTAGE/HYSTROSCOPY WITH NOVASURE ABLATION N/A 04/18/2014   Procedure: DILATATION & CURETTAGE/HYSTEROSCOPY WITH NOVASURE ENDOMETRIAL ABLATION;  Surgeon: Florian Buff, MD;  Location: [facility];  Service: Gynecology;  Laterality: N/A;  Uterine Cavity Length 5cm     . ESOPHAGOGASTRODUODENOSCOPY N/A 12/01/2013   EHM:CNOB non erosive gastritis. +H.pylori   . NO PAST SURGERIES    . POLYPECTOMY N/A 04/18/2014   Procedure: ENDOMETRIAL POLYPECTOMY;  Surgeon: Florian Buff, MD;  Location: [facility];  Service: Gynecology;  Laterality: N/A;  .  TEE WITHOUT CARDIOVERSION N/A 10/07/2018   Procedure: TRANSESOPHAGEAL ECHOCARDIOGRAM (TEE);  Surgeon: Lajuana Matte, MD;  Location: [place];  Service: Open Heart Surgery;  Laterality: N/A;    Social History:  reports that she has never smoked. She has never used smokeless tobacco. She reports that she does not drink  alcohol or use drugs.  Allergies: No Known Allergies  Medications Prior to Admission  Medication Sig Dispense Refill  . Calcium Citrate-Vitamin D (CALCIUM + D PO) Take 1 tablet by mouth daily.    . cetirizine (ZYRTEC) 10 MG tablet Take 10 mg by mouth daily.    . diclofenac sodium (VOLTAREN) 1 % GEL Apply 1 application topically every 6 (six) hours as needed (heel pain).    [name] docusate sodium (STOOL SOFTENER) 100 MG capsule Take 100 mg by mouth every Monday, Wednesday, and Friday.    [name] FLUoxetine (PROZAC) 10 MG tablet Take 10 mg by mouth at bedtime.     . fluticasone (FLONASE) 50 MCG/ACT nasal spray Place 1 spray into both nostrils 2 (two) times daily as needed for allergies.     [name] levothyroxine (SYNTHROID) 125 MCG tablet Take 125 mcg by mouth daily before breakfast.     . montelukast (SINGULAIR) 10 MG tablet Take 10 mg by mouth at bedtime.    . Multiple Vitamin (MULTIVITAMIN) tablet Take 1 tablet by mouth daily.    [name] omeprazole (PRILOSEC) 20 MG capsule TAKE 1 CAPSULE BY MOUTH ONCE DAILY. 30 capsule 5  . rosuvastatin (CRESTOR) 5 MG tablet Take 5 mg by mouth daily.      Blood pressure (!) 157/90, pulse 94, temperature 98.7 F (37.1 C), temperature source Oral, resp. rate (!) 26, height 5' 2"  (1.575 m), weight 89 kg, last menstrual period 04/16/2013, SpO2 93 %. Physical Exam: Physical Exam Constitutional:      General: She is not in acute distress.    Appearance: She is well-developed. She is obese.  HENT:     Head: Normocephalic and atraumatic.     Right Ear: External ear normal.     Left Ear: External ear normal.     Nose: Nose normal.     Mouth/Throat:     Mouth: Mucous membranes are dry.  Eyes:     General: Lids are normal. No scleral icterus.    Extraocular Movements: Extraocular movements intact.     Conjunctiva/sclera: Conjunctivae normal.  Neck:     Musculoskeletal: Normal range of motion and neck supple.  Cardiovascular:     Rate and Rhythm: Normal rate and regular  rhythm.     Pulses:          Radial pulses are 2+ on the right side and 2+ on the left side.       Dorsalis pedis pulses are 2+ on the right side and 2+ on the left side.     Comments: Trace edema in feet bilaterally  Pulmonary:     Effort: Pulmonary effort is normal.     Breath sounds: Normal breath sounds. No decreased breath sounds, wheezing, rhonchi or rales.  Abdominal:     General: Bowel sounds are normal. There is no distension.     Palpations: Abdomen is soft. There is no hepatomegaly or splenomegaly.     Tenderness: There is abdominal tenderness (mild) in the right upper quadrant. There is no guarding or rebound. Negative signs include Murphy's sign.     Hernia: No hernia is present.  Genitourinary:    Comments: Foley present Musculoskeletal:  Comments: ROM grossly intact in all 4 extremities  Skin:    General: Skin is warm and dry.     Coloration: Skin is not jaundiced.  Neurological:     Mental Status: She is alert.  Psychiatric:        Mood and Affect: Affect is blunt.        Speech: Speech is delayed.        Behavior: Behavior is slowed. Behavior is cooperative.     Results for orders placed or performed during the hospital encounter of 10/01/18 (from the past 48 hour(s))  Urine Culture     Status: Abnormal   Collection Time: 10/30/18 11:09 AM   Specimen: Urine, Catheterized  Result Value Ref Range   Specimen Description URINE, CATHETERIZED    Special Requests      NONE Performed at [facility] [address] [place], [place] [address]    Culture MULTIPLE SPECIES PRESENT, SUGGEST RECOLLECTION (A)    Report Status 10/31/2018 FINAL   TSH     Status: Abnormal   Collection Time: 10/30/18 11:28 AM  Result Value Ref Range   TSH 19.826 (H) 0.350 - 4.500 uIU/mL    Comment: Performed by a 3rd Generation assay with a functional sensitivity of <=0.01 uIU/mL. Performed at [facility], [place] [address], [place], [place] [address]   T3, free     Status:  None   Collection Time: 10/30/18 11:28 AM  Result Value Ref Range   T3, Free 2.3 2.0 - 4.4 pg/mL    Comment: (NOTE) Performed At: [facility] [place], [place] [id number] Rush Farmer MD FH:[id number]   T4     Status: None   Collection Time: 10/30/18 11:28 AM  Result Value Ref Range   T4, Total 5.8 4.5 - 12.0 ug/dL    Comment: (NOTE) Performed At: [facility] [place], [place] [id number] Rush Farmer MD XE:[id number]   Glucose, capillary     Status: Abnormal   Collection Time: 10/30/18 11:43 AM  Result Value Ref Range   Glucose-Capillary 108 (H) 70 - 99 mg/dL  Glucose, capillary     Status: None   Collection Time: 10/30/18  3:48 PM  Result Value Ref Range   Glucose-Capillary 96 70 - 99 mg/dL  Glucose, capillary     Status: None   Collection Time: 10/30/18  8:35 PM  Result Value Ref Range   Glucose-Capillary 87 70 - 99 mg/dL   Comment 1 Notify RN    Comment 2 Document in Chart   Glucose, capillary     Status: Abnormal   Collection Time: 10/31/18 12:06 AM  Result Value Ref Range   Glucose-Capillary 127 (H) 70 - 99 mg/dL   Comment 1 Notify RN    Comment 2 Document in Chart   Glucose, capillary     Status: Abnormal   Collection Time: 10/31/18  4:06 AM  Result Value Ref Range   Glucose-Capillary 135 (H) 70 - 99 mg/dL   Comment 1 Notify RN    Comment 2 Document in Chart   Glucose, capillary     Status: None   Collection Time: 10/31/18  8:16 AM  Result Value Ref Range   Glucose-Capillary 96 70 - 99 mg/dL   Comment 1 Notify RN    Comment 2 Document in Chart   Glucose, capillary     Status: None   Collection Time: 10/31/18 11:44 AM  Result Value Ref Range  Glucose-Capillary 98 70 - 99 mg/dL   Comment 1 Notify RN    Comment 2 Document in Chart   Glucose, capillary     Status: Abnormal   Collection Time: 10/31/18  4:10 PM  Result Value Ref Range   Glucose-Capillary 121 (H) 70 - 99 mg/dL   Comment 1 Notify RN     Comment 2 Document in Chart   Glucose, capillary     Status: None   Collection Time: 10/31/18  8:38 PM  Result Value Ref Range   Glucose-Capillary 90 70 - 99 mg/dL  Glucose, capillary     Status: Abnormal   Collection Time: 11/01/18 12:03 AM  Result Value Ref Range   Glucose-Capillary 144 (H) 70 - 99 mg/dL  Glucose, capillary     Status: None   Collection Time: 11/01/18  5:25 AM  Result Value Ref Range   Glucose-Capillary 99 70 - 99 mg/dL  CBC     Status: Abnormal   Collection Time: 11/01/18  5:30 AM  Result Value Ref Range   WBC 6.0 4.0 - 10.5 K/uL   RBC 2.60 (L) 3.87 - 5.11 MIL/uL   Hemoglobin 7.6 (L) 12.0 - 15.0 g/dL   HCT 25.2 (L) 36.0 - 46.0 %   MCV 96.9 80.0 - 100.0 fL   MCH 29.2 26.0 - 34.0 pg   MCHC 30.2 30.0 - 36.0 g/dL   RDW 17.4 (H) 11.5 - 15.5 %   Platelets 145 (L) 150 - 400 K/uL   nRBC 0.0 0.0 - 0.2 %    Comment: Performed at [facility], [address], [place], [place] [address]  Comprehensive metabolic panel     Status: Abnormal   Collection Time: 11/01/18  5:30 AM  Result Value Ref Range   Sodium 140 135 - 145 mmol/L   Potassium 4.2 3.5 - 5.1 mmol/L   Chloride 102 98 - 111 mmol/L   CO2 27 22 - 32 mmol/L   Glucose, Bld 102 (H) 70 - 99 mg/dL   BUN 41 (H) 6 - 20 mg/dL   Creatinine, Ser 1.65 (H) 0.44 - 1.00 mg/dL   Calcium 8.5 (L) 8.9 - 10.3 mg/dL   Total Protein 5.4 (L) 6.5 - 8.1 g/dL   Albumin 1.6 (L) 3.5 - 5.0 g/dL   AST 39 15 - 41 U/L   ALT 8 0 - 44 U/L   Alkaline Phosphatase 192 (H) 38 - 126 U/L   Total Bilirubin 1.0 0.3 - 1.2 mg/dL   GFR calc non Af Amer 34 (L) >60 mL/min   GFR calc Af Amer 40 (L) >60 mL/min   Anion gap 11 5 - 15    Comment: Performed at [facility], [place] [address] [place], [place] [address]  Lipase, blood     Status: None   Collection Time: 11/01/18  5:30 AM  Result Value Ref Range   Lipase 39 11 - 51 U/L    Comment: Performed at [facility] [address] [place], [place] [address]  Glucose, capillary      Status: Abnormal   Collection Time: 11/01/18  8:14 AM  Result Value Ref Range   Glucose-Capillary 103 (H) 70 - 99 mg/dL   Ct Abdomen Wo Contrast  Result Date: 10/31/2018 CLINICAL DATA:  Evaluate anatomy for percutaneous gastrostomy tube placement. History of aortic valve replacement. Poor appetite and nutrition. EXAM: CT ABDOMEN WITHOUT CONTRAST TECHNIQUE: Multidetector CT imaging of the abdomen was performed following the standard protocol  without IV contrast. COMPARISON:  CT chest 10/05/2018.  CT abdomen 12/05/2014 FINDINGS: Lower chest: Small to moderate sized right pleural effusion with compressive atelectasis in the right lower lobe. Aortic valve replacement. Patchy airspace densities in left lower lobe are poorly visualized due to motion artifact. Findings are suggestive for infectious or inflammatory process. Hepatobiliary: Gallbladder is at least moderately distended and appears to be contiguous with a small amount of perihepatic fluid. There was perihepatic fluid and gallbladder distention on the CT from 10/05/2018. Perihepatic fluid may be loculated with a component along the right inferior aspect of the liver and another component along the anterior liver and adjacent to the gallbladder. Difficult to exclude gallbladder inflammation. Limited evaluation for a hepatic lesion on this non contrast examination. Pancreas: Unremarkable. No pancreatic ductal dilatation or surrounding inflammatory changes. Spleen: Normal in size without focal abnormality. Adrenals/Urinary Tract: Normal adrenal glands. Normal appearance of both kidneys. No hydronephrosis. Stomach/Bowel: Nasogastric tube tip in the distal stomach body region. Transverse colon is caudal to the stomach. There should be a percutaneous window for gastrostomy tube placement after stomach insufflation. No evidence for bowel dilatation. Mild mesenteric edema in the right upper abdomen near the liver and duodenum. Vascular/Lymphatic: Visualized  vascular structures are unremarkable. No significant lymph node enlargement. Other: Subcutaneous edema.  Negative for free air. Musculoskeletal: Prior median sternotomy. IMPRESSION: 1. Anatomy should be amendable for percutaneous gastrostomy tube placement. Nasogastric tube tip in the distal stomach body region. 2. Perihepatic fluid collections with at least mild gallbladder distension. One perihepatic collection appears to be contiguous with the gallbladder and cannot exclude gallbladder inflammation. Recommend further characterization of the gallbladder and perihepatic space with a right upper quadrant ultrasound. 3. Right pleural effusion with compressive atelectasis in the right lower lobe. Poorly characterized patchy densities in left lower lobe due to motion artifact but concerning for residual inflammation or infection in left lower lobe. Electronically Signed   By: Markus Daft M.D.   On: 10/31/2018 15:29   US Abdomen Limited  Result Date: 11/01/2018 CLINICAL DATA:  Abdominal pain EXAM: ULTRASOUND ABDOMEN LIMITED RIGHT UPPER QUADRANT COMPARISON:  CT abdomen and pelvis 10/31/2018 FINDINGS: Gallbladder: Mildly distended gallbladder containing a 15 mm gallstone and small amount of sludge. Upper normal gallbladder wall thickness. Perihepatic and pericholecystic fluid identified. No sonographic Murphy sign. Common bile duct: Diameter: 5 mm Liver: Normal echogenicity without mass or nodularity. Portal vein is patent on color Doppler imaging with normal direction of blood flow towards the liver. Other: Perihepatic and pericholecystic free fluid. Small focal fluid collection identified adjacent to the gallbladder 6.7 x 1.9 x 4.6 cm. IMPRESSION: Mildly distended gallbladder with a 15 mm gallstone, small amount of sludge, and pericholecystic and perihepatic free fluid. No definite sonographic Percell Miller sign is identified; however, cannot exclude acute cholecystitis. Consider follow-up radionuclide hepatobiliary imaging  to exclude acute cholecystitis. Electronically Signed   By: Lavonia Dana M.D.   On: 11/01/2018 07:48      Assessment/Plan MSSA endocarditis s/p aortic valve replacement Hypothyroidism GERD Mental disability Deconditioned state  Cholelithiasis, possible cholecystitis  - patient with poor PO intake, occasional emesis - RUQ [place] with 15 mm gallstone, small amt sludge, pericholecystic fluid - WBC 6.0, afebrile  - Alk phos elevated at 192, LFTs otherwise normal - patient mildly ttp on exam but negative Murphy sign - patient is a poor historian and unable to tell me how long she has had symptoms or really elaborate on symptoms - attempted to contact patient's aunt, [name], but  no answer - will get HIDA  FEN: on TF via cortrak - hold for HIDA VTE: SCDs, lovenox ID: currently on ancef and rifampin  Brigid Re, [facility] Surgery 11/01/2018, 10:45 AM Pager: [phone] Consults: [phone]

## 2018-11-01 NOTE — Progress Notes (Signed)
      [address]       [place] [address]             [phone]      25 Days Post-Op Procedure(s) (LRB): AORTIC VALVE REPLACEMENT (AVR) (N/A) TRANSESOPHAGEAL ECHOCARDIOGRAM (TEE) (N/A)   Subjective:  No new complaints.  Continues to say she feels poorly.  Wants to go home.  Objective: Vital signs in last 24 hours: Temp:  [98.2 F (36.8 C)-99.2 F (37.3 C)] 98.7 F (37.1 C) (09/01 0523) Pulse Rate:  [83-94] 94 (09/01 0523) Cardiac Rhythm: Normal sinus rhythm (08/31 1909) Resp:  [18-26] 25 (09/01 0533) BP: (138-154)/(70-85) 148/82 (09/01 0523) SpO2:  [92 %-100 %] 93 % (09/01 0533) Weight:  [89 kg] 89 kg (09/01 0533)  Intake/Output from previous day: 08/31 0701 - 09/01 0700 In: 691.2 [P.O.:60; NG/GT:143; IV Piggyback:488.2] Out: 800 [Urine:800]  General appearance: alert, cooperative and no distress Heart: regular rate and rhythm Lungs: diminished breath sounds bibasilar Abdomen: soft, non-tender; bowel sounds normal; no masses,  no organomegaly Extremities: edema trace Wound: clean and dry  Lab Results: Recent Labs    11/01/18 0530  WBC 6.0  HGB 7.6*  HCT 25.2*  PLT 145*   BMET:  Recent Labs    11/01/18 0530  NA 140  K 4.2  CL 102  CO2 27  GLUCOSE 102*  BUN 41*  CREATININE 1.65*  CALCIUM 8.5*    PT/INR: No results for input(s): LABPROT, INR in the last 72 hours. ABG    Component Value Date/Time   PHART 7.430 10/08/2018 0920   HCO3 23.6 10/08/2018 0920   TCO2 25 10/08/2018 0920   ACIDBASEDEF 6.0 (H) 10/08/2018 0307   O2SAT 94.0 10/08/2018 0920   CBG (last 3)  Recent Labs    10/31/18 2038 11/01/18 0003 11/01/18 0525  GLUCAP 90 144* 99    Assessment/Plan: S/P Procedure(s) (LRB): AORTIC VALVE REPLACEMENT (AVR) (N/A) TRANSESOPHAGEAL ECHOCARDIOGRAM (TEE) (N/A)  1. CV-hemodynamically stable in NSR 2. Pulm- no acute issues, small right pleural effusion/atelectasis on CT  3. Renal- creatinine has been at baseline, on  flomax, Urine culture suggests contamination, recommending repeat collection will order 4. ID- MSSA Endocarditis- on ABX per ID, completion date is 9/17 5. GI- poor oral intake persists, Cortrak in place, GI consult has been obtained, for possible EGD today will likely require PEG tube 6. Deconditioning- needs SNF placement, however no bed offers have been presented 7. Dispo- patient stable, GI for possible EGD today, if no obvious cause of inability to eat, will require PEG tube placement, continue PT/OT   LOS: 31 days    Audrey Collier 11/01/2018

## 2018-11-01 NOTE — Progress Notes (Addendum)
Daily Rounding Note  11/01/2018, 9:10 AM  LOS: 31 days   SUBJECTIVE:   Chief complaint:  Anorexia.       Pt c/o nausea and not feeling well this AM.  No abd pain.  No vomiting  OBJECTIVE:         Vital signs in last 24 hours:    Temp:  [98.2 F (36.8 C)-99.2 F (37.3 C)] 98.7 F (37.1 C) (09/01 0814) Pulse Rate:  [83-94] 94 (09/01 0814) Resp:  [18-26] 26 (09/01 0814) BP: (138-157)/(74-90) 157/90 (09/01 0814) SpO2:  [92 %-95 %] 93 % (09/01 0814) Weight:  [89 kg] 89 kg (09/01 0533) Last BM Date: 10/31/18 Filed Weights   10/30/18 0500 10/31/18 0413 11/01/18 0533  Weight: 90.3 kg 90.7 kg 89 kg   General: looks well, comfortable   Heart: RRR Chest: clear bil, reduced BS Abdomen: soft, NT, active BS  Extremities: non pitting edema in all 4 limbs Neuro/Psych:  Alert, appropriate, speech muddled and difficult to understand.  Follows commands.    Intake/Output from previous day: 08/31 0701 - 09/01 0700 In: 691.2 [P.O.:60; NG/GT:143; IV Piggyback:488.2] Out: 800 [Urine:800]  Intake/Output this shift: No intake/output data recorded.  Lab Results: Recent Labs    11/01/18 0530  WBC 6.0  HGB 7.6*  HCT 25.2*  PLT 145*   BMET Recent Labs    11/01/18 0530  NA 140  K 4.2  CL 102  CO2 27  GLUCOSE 102*  BUN 41*  CREATININE 1.65*  CALCIUM 8.5*   LFT Recent Labs    11/01/18 0530  PROT 5.4*  ALBUMIN 1.6*  AST 39  ALT 8  ALKPHOS 192*  BILITOT 1.0   PT/INR No results for input(s): LABPROT, INR in the last 72 hours. Hepatitis Panel No results for input(s): HEPBSAG, HCVAB, HEPAIGM, HEPBIGM in the last 72 hours.  Studies/Results: Ct Abdomen Wo Contrast  Result Date: 10/31/2018 CLINICAL DATA:  Evaluate anatomy for percutaneous gastrostomy tube placement. History of aortic valve replacement. Poor appetite and nutrition. EXAM: CT ABDOMEN WITHOUT CONTRAST TECHNIQUE: Multidetector CT imaging of the abdomen  was performed following the standard protocol without IV contrast. COMPARISON:  CT chest 10/05/2018.  CT abdomen 12/05/2014 FINDINGS: Lower chest: Small to moderate sized right pleural effusion with compressive atelectasis in the right lower lobe. Aortic valve replacement. Patchy airspace densities in left lower lobe are poorly visualized due to motion artifact. Findings are suggestive for infectious or inflammatory process. Hepatobiliary: Gallbladder is at least moderately distended and appears to be contiguous with a small amount of perihepatic fluid. There was perihepatic fluid and gallbladder distention on the CT from 10/05/2018. Perihepatic fluid may be loculated with a component along the right inferior aspect of the liver and another component along the anterior liver and adjacent to the gallbladder. Difficult to exclude gallbladder inflammation. Limited evaluation for a hepatic lesion on this non contrast examination. Pancreas: Unremarkable. No pancreatic ductal dilatation or surrounding inflammatory changes. Spleen: Normal in size without focal abnormality. Adrenals/Urinary Tract: Normal adrenal glands. Normal appearance of both kidneys. No hydronephrosis. Stomach/Bowel: Nasogastric tube tip in the distal stomach body region. Transverse colon is caudal to the stomach. There should be a percutaneous window for gastrostomy tube placement after stomach insufflation. No evidence for bowel dilatation. Mild mesenteric edema in the right upper abdomen near the liver and duodenum. Vascular/Lymphatic: Visualized vascular structures are unremarkable. No significant lymph node enlargement. Other: Subcutaneous edema.  Negative for free air. Musculoskeletal:  Prior median sternotomy. IMPRESSION: 1. Anatomy should be amendable for percutaneous gastrostomy tube placement. Nasogastric tube tip in the distal stomach body region. 2. Perihepatic fluid collections with at least mild gallbladder distension. One perihepatic  collection appears to be contiguous with the gallbladder and cannot exclude gallbladder inflammation. Recommend further characterization of the gallbladder and perihepatic space with a right upper quadrant ultrasound. 3. Right pleural effusion with compressive atelectasis in the right lower lobe. Poorly characterized patchy densities in left lower lobe due to motion artifact but concerning for residual inflammation or infection in left lower lobe. Electronically Signed   By: Markus Daft M.D.   On: 10/31/2018 15:29   US Abdomen Limited  Result Date: 11/01/2018 CLINICAL DATA:  Abdominal pain EXAM: ULTRASOUND ABDOMEN LIMITED RIGHT UPPER QUADRANT COMPARISON:  CT abdomen and pelvis 10/31/2018 FINDINGS: Gallbladder: Mildly distended gallbladder containing a 15 mm gallstone and small amount of sludge. Upper normal gallbladder wall thickness. Perihepatic and pericholecystic fluid identified. No sonographic Murphy sign. Common bile duct: Diameter: 5 mm Liver: Normal echogenicity without mass or nodularity. Portal vein is patent on color Doppler imaging with normal direction of blood flow towards the liver. Other: Perihepatic and pericholecystic free fluid. Small focal fluid collection identified adjacent to the gallbladder 6.7 x 1.9 x 4.6 cm. IMPRESSION: Mildly distended gallbladder with a 15 mm gallstone, small amount of sludge, and pericholecystic and perihepatic free fluid. No definite sonographic Percell Miller sign is identified; however, cannot exclude acute cholecystitis. Consider follow-up radionuclide hepatobiliary imaging to exclude acute cholecystitis. Electronically Signed   By: Lavonia Dana M.D.   On: 11/01/2018 07:48    ASSESMENT:   *   Anorexia.  Some vague pp abd pain. Ultrasound shows large GB stone, sludge, pericholecystic and perhepatic fluid.  Can not exclude cholecystitis.  LFTs and Lipase normal.     *   Anemia.     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     PLAN   *   Would seek general surgery  opinion.  Message sent to [facility] PA Barrett  *   No plans for EGD as of now, await gen surgery opinion.         Azucena Freed  11/01/2018, 9:10 AM Phone [phone]

## 2018-11-02 LAB — GLUCOSE, CAPILLARY
Glucose-Capillary: 121 mg/dL — ABNORMAL HIGH (ref 70–99)
Glucose-Capillary: 125 mg/dL — ABNORMAL HIGH (ref 70–99)
Glucose-Capillary: 128 mg/dL — ABNORMAL HIGH (ref 70–99)
Glucose-Capillary: 140 mg/dL — ABNORMAL HIGH (ref 70–99)
Glucose-Capillary: 89 mg/dL (ref 70–99)
Glucose-Capillary: 99 mg/dL (ref 70–99)

## 2018-11-02 MED ORDER — METOCLOPRAMIDE HCL 5 MG/ML IJ SOLN: 10.0000 mg | Freq: Once | INTRAMUSCULAR | Status: DC

## 2018-11-02 MED ORDER — ENOXAPARIN SODIUM 40 MG/0.4ML ~~LOC~~ SOLN
40.0000 mg | Freq: Every day | SUBCUTANEOUS | Status: DC
  Administered 2018-11-03: 40 mg via SUBCUTANEOUS
  Filled 2018-11-02: qty 0.4

## 2018-11-02 MED ORDER — AMLODIPINE BESYLATE 5 MG PO TABS
5.0000 mg | ORAL_TABLET | Freq: Every day | ORAL | Status: DC
  Administered 2018-11-02 – 2018-11-05 (×3): 5 mg via ORAL
  Filled 2018-11-02 (×3): qty 1

## 2018-11-02 NOTE — Progress Notes (Addendum)
      [address]       [place] [id number]             [phone]        26 Days Post-Op Procedure(s) (LRB): AORTIC VALVE REPLACEMENT (AVR) (N/A) TRANSESOPHAGEAL ECHOCARDIOGRAM (TEE) (N/A)  Subjective: Patient still with intermittent nausea, but no vomiting last evening. When asked if her belly hurts she shakes her head yes  Objective: Vital signs in last 24 hours: Temp:  [98.1 F (36.7 C)-98.7 F (37.1 C)] 98.4 F (36.9 C) (09/02 0744) Pulse Rate:  [64-94] 64 (09/01 2010) Cardiac Rhythm: Normal sinus rhythm (09/01 1924) Resp:  [20-28] 23 (09/02 0744) BP: (138-157)/(70-90) 150/87 (09/02 0744) SpO2:  [91 %-94 %] 91 % (09/02 0744) Weight:  [88.8 kg] 88.8 kg (09/02 0438)  Pre op weight 83.9 kg Current Weight  11/02/18 88.8 kg      Intake/Output from previous day: 09/01 0701 - 09/02 0700 In: 635 [NG/GT:635] Out: 1070 [Urine:1070]   Physical Exam:  Cardiovascular: RRR, no murmur Pulmonary: Mostly clear Abdomen: Soft, mild diffuse tenderness when asked and on exam grimaces with deep palpation,  sporadic bowel sounds present. Extremities: SCDs in place,UE edema Wound: Clean and dry.  No erythema or signs of infection.  Lab Results: CBC: Recent Labs    11/01/18 0530  WBC 6.0  HGB 7.6*  HCT 25.2*  PLT 145*   BMET:  Recent Labs    11/01/18 0530  NA 140  K 4.2  CL 102  CO2 27  GLUCOSE 102*  BUN 41*  CREATININE 1.65*  CALCIUM 8.5*    PT/INR:  Lab Results  Component Value Date   INR 1.7 (H) 10/07/2018   INR 1.4 (H) 10/01/2018   ABG:  INR: Will add last result for INR, ABG once components are confirmed Will add last 4 CBG results once components are confirmed  Assessment/Plan:  1. CV - SR in the 80's. On Lopressor 25 mg bid. Hypertensive with SBP in the 150's. No ACE/ARB secondary to elevated creatinine. Will start Amlodipine.  2.  Pulmonary - On room air.  Encourage incentive spirometer 3. AKI- Creatinine yesterday 1.65.  Creatinine upon admission 1.7.  Not on ACE/ARB or diuretic 4. Expected acute blood loss anemia -  Last H and H this am decreased to 7.6 and 25.2. Occult blood negative.  5. ID-Last WBC decreased to 6,000. On Cefazolin 2 g bid and Rifampin 300 mg tid for MSSA. Per infectious disease, will need a total of 6 weeks (last dose 09/17). Has PICC line 6. Hypothyroidism-on Levothyroxine 125 mcg daily 7. GI-per nutritional management, po intake inadequate. Has Cortrak, TFs at 65 ml/hour NIGHTLY. Continue calorie count. Also, on MVI, and on Dysphagia II diet. Appreciate nutrition assistance. US abdomen yesterday showed mildly distended gallbladder with a 15 mm gallstone, small amount of sludge, and pericholecystic and perihepatic free fluid. No definite sonographic Percell Miller sign is identified. HIDA was negative for acute cholecystitis. 8. Urinary retention-on Flomax 0.4 mg daily. Foley re inserted. Most recent urine culture showed multiple species, suggest recollection 9. CBGs 128/140/125. No history of diabetes but on tube feedings at night. 10. Deconditioning-continue PT/OT. Would benefit from SNF bed   Donielle M ZimmermanPA-C 11/02/2018,7:55 AM [phone]   Agree with above HIDA negative, no need for cholecystectomy Awaiting placement to SNF.

## 2018-11-02 NOTE — Progress Notes (Signed)
Daily Rounding Note  11/02/2018, 8:41 AM  LOS: 32 days   SUBJECTIVE:   Chief complaint:  Anorexia after valve replacement surgery.     Still not hungry.  Points to left abdomen as source of pain discomfort.  No N/V  OBJECTIVE:         Vital signs in last 24 hours:    Temp:  [98.1 F (36.7 C)-98.7 F (37.1 C)] 98.4 F (36.9 C) (09/02 0744) Pulse Rate:  [64] 64 (09/01 2010) Resp:  [20-28] 23 (09/02 0744) BP: (138-150)/(70-90) 150/87 (09/02 0744) SpO2:  [91 %-94 %] 91 % (09/02 0744) Weight:  [88.8 kg] 88.8 kg (09/02 0438) Last BM Date: 10/31/18 Filed Weights   10/31/18 0413 11/01/18 0533 11/02/18 0438  Weight: 90.7 kg 89 kg 88.8 kg   General: looks pale but well   Heart: RRR Chest: clear.  No dyspnea or cough Abdomen: soft, NT, ND.  Hypoactive BS  Extremities: non-pitting edema in all  4 limbs Neuro/Psych:  Cooperative, follows commands.  Moves all 4 limbs.    Intake/Output from previous day: 09/01 0701 - 09/02 0700 In: 635 [NG/GT:635] Out: 1070 [Urine:1070]  Intake/Output this shift: No intake/output data recorded.  Lab Results: Recent Labs    11/01/18 0530  WBC 6.0  HGB 7.6*  HCT 25.2*  PLT 145*   BMET Recent Labs    11/01/18 0530  NA 140  K 4.2  CL 102  CO2 27  GLUCOSE 102*  BUN 41*  CREATININE 1.65*  CALCIUM 8.5*   LFT Recent Labs    11/01/18 0530  PROT 5.4*  ALBUMIN 1.6*  AST 39  ALT 8  ALKPHOS 192*  BILITOT 1.0   PT/INR No results for input(s): LABPROT, INR in the last 72 hours. Hepatitis Panel No results for input(s): HEPBSAG, HCVAB, HEPAIGM, HEPBIGM in the last 72 hours.  Studies/Results: Ct Abdomen Wo Contrast  Result Date: 10/31/2018 CLINICAL DATA:  Evaluate anatomy for percutaneous gastrostomy tube placement. History of aortic valve replacement. Poor appetite and nutrition. EXAM: CT ABDOMEN WITHOUT CONTRAST TECHNIQUE: Multidetector CT imaging of the abdomen was  performed following the standard protocol without IV contrast. COMPARISON:  CT chest 10/05/2018.  CT abdomen 12/05/2014 FINDINGS: Lower chest: Small to moderate sized right pleural effusion with compressive atelectasis in the right lower lobe. Aortic valve replacement. Patchy airspace densities in left lower lobe are poorly visualized due to motion artifact. Findings are suggestive for infectious or inflammatory process. Hepatobiliary: Gallbladder is at least moderately distended and appears to be contiguous with a small amount of perihepatic fluid. There was perihepatic fluid and gallbladder distention on the CT from 10/05/2018. Perihepatic fluid may be loculated with a component along the right inferior aspect of the liver and another component along the anterior liver and adjacent to the gallbladder. Difficult to exclude gallbladder inflammation. Limited evaluation for a hepatic lesion on this non contrast examination. Pancreas: Unremarkable. No pancreatic ductal dilatation or surrounding inflammatory changes. Spleen: Normal in size without focal abnormality. Adrenals/Urinary Tract: Normal adrenal glands. Normal appearance of both kidneys. No hydronephrosis. Stomach/Bowel: Nasogastric tube tip in the distal stomach body region. Transverse colon is caudal to the stomach. There should be a percutaneous window for gastrostomy tube placement after stomach insufflation. No evidence for bowel dilatation. Mild mesenteric edema in the right upper abdomen near the liver and duodenum. Vascular/Lymphatic: Visualized vascular structures are unremarkable. No significant lymph node enlargement. Other: Subcutaneous edema.  Negative for free air.  Musculoskeletal: Prior median sternotomy. IMPRESSION: 1. Anatomy should be amendable for percutaneous gastrostomy tube placement. Nasogastric tube tip in the distal stomach body region. 2. Perihepatic fluid collections with at least mild gallbladder distension. One perihepatic  collection appears to be contiguous with the gallbladder and cannot exclude gallbladder inflammation. Recommend further characterization of the gallbladder and perihepatic space with a right upper quadrant ultrasound. 3. Right pleural effusion with compressive atelectasis in the right lower lobe. Poorly characterized patchy densities in left lower lobe due to motion artifact but concerning for residual inflammation or infection in left lower lobe. Electronically Signed   By: Richarda OverlieAdam  Henn M.D.   On: 10/31/2018 15:29   Nm Hepatobiliary Liver Func  Result Date: 11/01/2018 CLINICAL DATA:  Right upper quadrant pain EXAM: NUCLEAR MEDICINE HEPATOBILIARY IMAGING TECHNIQUE: Sequential images of the abdomen were obtained out to 60 minutes following intravenous administration of radiopharmaceutical. 3 mg of morphine administered and additional images for 30 minutes obtained. RADIOPHARMACEUTICALS:  5.1 mCi Tc-821m  Choletec IV COMPARISON:  Ultrasound 11/01/2018, CT 10/31/2018 FINDINGS: Prompt uptake and biliary excretion of activity by the liver is seen. Gallbladder is nonvisualized after 60 minutes of imaging. Following intravenous administration of morphine, there is visualization of the gallbladder within 30 minutes period. Biliary activity passes into small bowel, consistent with patent common bile duct. IMPRESSION: Negative for acute cholecystitis. Electronically Signed   By: Jasmine PangKim  Fujinaga M.D.   On: 11/01/2018 19:31   [place] Abdomen Limited  Result Date: 11/01/2018 CLINICAL DATA:  Abdominal pain EXAM: ULTRASOUND ABDOMEN LIMITED RIGHT UPPER QUADRANT COMPARISON:  CT abdomen and pelvis 10/31/2018 FINDINGS: Gallbladder: Mildly distended gallbladder containing a 15 mm gallstone and small amount of sludge. Upper normal gallbladder wall thickness. Perihepatic and pericholecystic fluid identified. No sonographic Murphy sign. Common bile duct: Diameter: 5 mm Liver: Normal echogenicity without mass or nodularity. Portal vein is  patent on color Doppler imaging with normal direction of blood flow towards the liver. Other: Perihepatic and pericholecystic free fluid. Small focal fluid collection identified adjacent to the gallbladder 6.7 x 1.9 x 4.6 cm. IMPRESSION: Mildly distended gallbladder with a 15 mm gallstone, small amount of sludge, and pericholecystic and perihepatic free fluid. No definite sonographic Eulah PontMurphy sign is identified; however, cannot exclude acute cholecystitis. Consider follow-up radionuclide hepatobiliary imaging to exclude acute cholecystitis. Electronically Signed   By: Ulyses SouthwardMark  Boles M.D.   On: 11/01/2018 07:48   Scheduled Meds: . amLODipine  5 mg Oral Daily  . aspirin EC  325 mg Oral Daily   Or  . aspirin  324 mg Per Tube Daily  . calcium-vitamin D  1 tablet Oral Q breakfast  . Chlorhexidine Gluconate Cloth  6 each Topical Daily  . enoxaparin (LOVENOX) injection  40 mg Subcutaneous QHS  . feeding supplement (OSMOLITE 1.5 CAL)  1,000 mL Per Tube Q24H  . feeding supplement (PRO-STAT SUGAR FREE 64)  30 mL Per Tube BID  . FLUoxetine  10 mg Oral QHS  . levothyroxine  125 mcg Oral Q0600  . mouth rinse  15 mL Mouth Rinse BID  . metoprolol tartrate  25 mg Oral BID  . montelukast  10 mg Oral QHS  .  morphine injection  3 mg Intravenous Once  . multivitamin with minerals  1 tablet Oral Daily  . pantoprazole  40 mg Oral Daily  . rifampin  300 mg Oral Q8H  . rosuvastatin  5 mg Oral q1800  . sodium chloride flush  10-40 mL Intracatheter Q12H  . sodium chloride flush  3 mL Intravenous Q12H  . tamsulosin  0.4 mg Oral Daily   Continuous Infusions: . sodium chloride 500 mL (10/24/18 2031)  .  ceFAZolin (ANCEF) IV 2 g (11/02/18 0616)   PRN Meds:.sodium chloride, bisacodyl **OR** bisacodyl, metoprolol tartrate, morphine injection, ondansetron (ZOFRAN) IV, oxyCODONE, sodium chloride flush, sodium chloride flush, traMADol   ASSESMENT:   *   Anorexia.  Some vague pp abd pain. Ultrasound shows large GB  stone, sludge, pericholecystic and perhepatic fluid.  Can not exclude cholecystitis.  LFTs and Lipase normal.   HIDA negative for acute cholecystitis, slow to visualize GB.    *   Anemia.     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      PLAN   *   EGD for eval anorexia tomorrow.  Hold Lovenox tonight to allow for biopsy or other intervention at EGD.      Azucena Freed  11/02/2018, 8:41 AM Phone [phone]

## 2018-11-02 NOTE — Progress Notes (Signed)
[facility] Progress Note  26 Days Post-Op  Subjective: CC: anorexia Patient still saying that her belly hurts some. Was unable to speak with patient's aunt yesterday but will try again today. Patient is 3.5 weeks out from valve replacement. HIDA negative for acute cholecystitis.   Objective: Vital signs in last 24 hours: Temp:  [98.1 F (36.7 C)-98.7 F (37.1 C)] 98.4 F (36.9 C) (09/02 0744) Pulse Rate:  [64] 64 (09/01 2010) Resp:  [20-28] 23 (09/02 0744) BP: (138-150)/(70-90) 150/87 (09/02 0744) SpO2:  [91 %-94 %] 91 % (09/02 0744) Weight:  [88.8 kg] 88.8 kg (09/02 0438) Last BM Date: 10/31/18  Intake/Output from previous day: 09/01 0701 - 09/02 0700 In: 635 [NG/GT:635] Out: 1070 [Urine:1070] Intake/Output this shift: No intake/output data recorded.  PE: Gen:  Alert, NAD, pleasant Card:  Regular rate and rhythm Pulm:  Normal effort, clear to auscultation bilaterally Abd: Soft, non-tender, mildly distended, +BS   Lab Results:  Recent Labs    11/01/18 0530  WBC 6.0  HGB 7.6*  HCT 25.2*  PLT 145*   BMET Recent Labs    11/01/18 0530  NA 140  K 4.2  CL 102  CO2 27  GLUCOSE 102*  BUN 41*  CREATININE 1.65*  CALCIUM 8.5*   PT/INR No results for input(s): LABPROT, INR in the last 72 hours. CMP     Component Value Date/Time   NA 140 11/01/2018 0530   K 4.2 11/01/2018 0530   CL 102 11/01/2018 0530   CO2 27 11/01/2018 0530   GLUCOSE 102 (H) 11/01/2018 0530   BUN 41 (H) 11/01/2018 0530   CREATININE 1.65 (H) 11/01/2018 0530   CALCIUM 8.5 (L) 11/01/2018 0530   PROT 5.4 (L) 11/01/2018 0530   ALBUMIN 1.6 (L) 11/01/2018 0530   AST 39 11/01/2018 0530   ALT 8 11/01/2018 0530   ALKPHOS 192 (H) 11/01/2018 0530   BILITOT 1.0 11/01/2018 0530   GFRNONAA 34 (L) 11/01/2018 0530   GFRAA 40 (L) 11/01/2018 0530   Lipase     Component Value Date/Time   LIPASE 39 11/01/2018 0530       Studies/Results: Ct Abdomen Wo Contrast  Result Date:  10/31/2018 CLINICAL DATA:  Evaluate anatomy for percutaneous gastrostomy tube placement. History of aortic valve replacement. Poor appetite and nutrition. EXAM: CT ABDOMEN WITHOUT CONTRAST TECHNIQUE: Multidetector CT imaging of the abdomen was performed following the standard protocol without IV contrast. COMPARISON:  CT chest 10/05/2018.  CT abdomen 12/05/2014 FINDINGS: Lower chest: Small to moderate sized right pleural effusion with compressive atelectasis in the right lower lobe. Aortic valve replacement. Patchy airspace densities in left lower lobe are poorly visualized due to motion artifact. Findings are suggestive for infectious or inflammatory process. Hepatobiliary: Gallbladder is at least moderately distended and appears to be contiguous with a small amount of perihepatic fluid. There was perihepatic fluid and gallbladder distention on the CT from 10/05/2018. Perihepatic fluid may be loculated with a component along the right inferior aspect of the liver and another component along the anterior liver and adjacent to the gallbladder. Difficult to exclude gallbladder inflammation. Limited evaluation for a hepatic lesion on this non contrast examination. Pancreas: Unremarkable. No pancreatic ductal dilatation or surrounding inflammatory changes. Spleen: Normal in size without focal abnormality. Adrenals/Urinary Tract: Normal adrenal glands. Normal appearance of both kidneys. No hydronephrosis. Stomach/Bowel: Nasogastric tube tip in the distal stomach body region. Transverse colon is caudal to the stomach. There should be a percutaneous window for gastrostomy tube placement  after stomach insufflation. No evidence for bowel dilatation. Mild mesenteric edema in the right upper abdomen near the liver and duodenum. Vascular/Lymphatic: Visualized vascular structures are unremarkable. No significant lymph node enlargement. Other: Subcutaneous edema.  Negative for free air. Musculoskeletal: Prior median sternotomy.  IMPRESSION: 1. Anatomy should be amendable for percutaneous gastrostomy tube placement. Nasogastric tube tip in the distal stomach body region. 2. Perihepatic fluid collections with at least mild gallbladder distension. One perihepatic collection appears to be contiguous with the gallbladder and cannot exclude gallbladder inflammation. Recommend further characterization of the gallbladder and perihepatic space with a right upper quadrant ultrasound. 3. Right pleural effusion with compressive atelectasis in the right lower lobe. Poorly characterized patchy densities in left lower lobe due to motion artifact but concerning for residual inflammation or infection in left lower lobe. Electronically Signed   By: Markus Daft M.D.   On: 10/31/2018 15:29   Nm Hepatobiliary Liver Func  Result Date: 11/01/2018 CLINICAL DATA:  Right upper quadrant pain EXAM: NUCLEAR MEDICINE HEPATOBILIARY IMAGING TECHNIQUE: Sequential images of the abdomen were obtained out to 60 minutes following intravenous administration of radiopharmaceutical. 3 mg of morphine administered and additional images for 30 minutes obtained. RADIOPHARMACEUTICALS:  5.1 mCi Tc-42m  Choletec IV COMPARISON:  Ultrasound 11/01/2018, CT 10/31/2018 FINDINGS: Prompt uptake and biliary excretion of activity by the liver is seen. Gallbladder is nonvisualized after 60 minutes of imaging. Following intravenous administration of morphine, there is visualization of the gallbladder within 30 minutes period. Biliary activity passes into small bowel, consistent with patent common bile duct. IMPRESSION: Negative for acute cholecystitis. Electronically Signed   By: Donavan Foil M.D.   On: 11/01/2018 19:31   US Abdomen Limited  Result Date: 11/01/2018 CLINICAL DATA:  Abdominal pain EXAM: ULTRASOUND ABDOMEN LIMITED RIGHT UPPER QUADRANT COMPARISON:  CT abdomen and pelvis 10/31/2018 FINDINGS: Gallbladder: Mildly distended gallbladder containing a 15 mm gallstone and small amount  of sludge. Upper normal gallbladder wall thickness. Perihepatic and pericholecystic fluid identified. No sonographic Murphy sign. Common bile duct: Diameter: 5 mm Liver: Normal echogenicity without mass or nodularity. Portal vein is patent on color Doppler imaging with normal direction of blood flow towards the liver. Other: Perihepatic and pericholecystic free fluid. Small focal fluid collection identified adjacent to the gallbladder 6.7 x 1.9 x 4.6 cm. IMPRESSION: Mildly distended gallbladder with a 15 mm gallstone, small amount of sludge, and pericholecystic and perihepatic free fluid. No definite sonographic Percell Miller sign is identified; however, cannot exclude acute cholecystitis. Consider follow-up radionuclide hepatobiliary imaging to exclude acute cholecystitis. Electronically Signed   By: Lavonia Dana M.D.   On: 11/01/2018 07:48    Anti-infectives: Anti-infectives (From admission, onward)   Start     Dose/Rate Route Frequency Ordered Stop   10/20/18 0000  ceFAZolin (ANCEF) IVPB     2 g Intravenous Every 8 hours 10/20/18 1001 11/25/18 2359   10/14/18 0600  rifampin (RIFADIN) capsule 300 mg     300 mg Oral Every 8 hours 10/14/18 0456     10/12/18 1200  ceFAZolin (ANCEF) IVPB 2g/100 mL premix     2 g 200 mL/hr over 30 Minutes Intravenous Every 8 hours 10/12/18 1053     10/10/18 1300  ceFAZolin (ANCEF) IVPB 2g/100 mL premix  Status:  Discontinued     2 g 200 mL/hr over 30 Minutes Intravenous Every 12 hours 10/10/18 1147 10/12/18 1053   10/08/18 1400  rifampin (RIFADIN) 60 mg/mL oral suspension 300 mg  Status:  Discontinued  300 mg Oral Every 8 hours 10/08/18 0839 10/14/18 0456   10/07/18 2200  rifampin (RIFADIN) 60 mg/mL oral suspension 300 mg  Status:  Discontinued     300 mg Per Tube Every 8 hours 10/07/18 1714 10/08/18 0839   10/07/18 2000  vancomycin (VANCOCIN) IVPB 1000 mg/200 mL premix     1,000 mg 200 mL/hr over 60 Minutes Intravenous  Once 10/07/18 1336 10/07/18 2158   10/07/18  0430  cefUROXime (ZINACEF) 750 mg in sodium chloride 0.9 % 100 mL IVPB  Status:  Discontinued     750 mg 200 mL/hr over 30 Minutes Intravenous To Surgery 10/06/18 1531 10/07/18 1248   10/07/18 0400  vancomycin (VANCOCIN) 1,000 mg in sodium chloride 0.9 % 1,000 mL irrigation      Irrigation To Surgery 10/06/18 1415 10/07/18 0932   10/07/18 0400  vancomycin (VANCOCIN) 1,250 mg in sodium chloride 0.9 % 250 mL IVPB     1,250 mg 166.7 mL/hr over 90 Minutes Intravenous To Surgery 10/06/18 1415 10/07/18 0800   10/07/18 0400  cefUROXime (ZINACEF) 1.5 g in sodium chloride 0.9 % 100 mL IVPB     1.5 g 200 mL/hr over 30 Minutes Intravenous To Surgery 10/06/18 1415 10/07/18 1129   10/06/18 1430  cefUROXime (ZINACEF) 750 mg in sodium chloride 0.9 % 100 mL IVPB  Status:  Discontinued     750 mg 200 mL/hr over 30 Minutes Intravenous To Surgery 10/06/18 1415 10/06/18 1532   10/05/18 1300  nafcillin 2 g in sodium chloride 0.9 % 100 mL IVPB  Status:  Discontinued     2 g 200 mL/hr over 30 Minutes Intravenous Every 4 hours 10/05/18 1109 10/10/18 1147   10/02/18 1200  ceFAZolin (ANCEF) IVPB 2g/100 mL premix  Status:  Discontinued     2 g 200 mL/hr over 30 Minutes Intravenous Every 8 hours 10/02/18 1040 10/05/18 1109   10/01/18 2000  cefTRIAXone (ROCEPHIN) 2 g in sodium chloride 0.9 % 100 mL IVPB  Status:  Discontinued     2 g 200 mL/hr over 30 Minutes Intravenous Every 24 hours 10/01/18 1958 10/02/18 1040   10/01/18 2000  azithromycin (ZITHROMAX) 500 mg in sodium chloride 0.9 % 250 mL IVPB  Status:  Discontinued     500 mg 250 mL/hr over 60 Minutes Intravenous Every 24 hours 10/01/18 1958 10/02/18 1040       Assessment/Plan MSSA endocarditis s/p aortic valve replacement Hypothyroidism GERD Mental disability Deconditioned state  Cholelithiasis, possible cholecystitis  - patient with poor PO intake, occasional emesis - RUQ [place] with 15 mm gallstone, small amt sludge, pericholecystic fluid - WBC 6.0  yesterday, afebrile  - HIDA negative for acute cholecystitis - GI following as well - ?possible EGD tomorrow - patient is only 3.5 weeks out from valve replacement, with no cholecystitis would not recommend laparoscopic cholecystectomy at this time. She may follow up in the outpatient setting if having symptoms from cholelithiasis. Recommend low fat diet and avoidance of greasy/spicy foods. - no indication for acute surgical intervention at this time, we will sign off. Please call if we can be of further assistance.    FEN: on TF via cortrak VTE: SCDs, lovenox ID: currently on ancef and rifampin  LOS: 32 days    Wells Guiles , [facility] 11/02/2018, 8:37 AM Pager: [phone] Consults: [phone] 7:00 AM - 4:30 PM M, W-F 7:00 AM - 11:30 AM Tues, Sat, Sun

## 2018-11-02 NOTE — Anesthesia Preprocedure Evaluation (Addendum)
Anesthesia Evaluation  Patient identified by MRN, date of birth, ID band Patient awake    Reviewed: Allergy & Precautions, NPO status , Patient's Chart, lab work & pertinent test results, reviewed documented beta blocker date and time   History of Anesthesia Complications Negative for: history of anesthetic complications  Airway Mallampati: IV  TM Distance: >3 FB Neck ROM: Full  Mouth opening: Limited Mouth Opening  Dental  (+) Edentulous Lower, Edentulous Upper   Pulmonary neg pulmonary ROS,    Pulmonary exam normal        Cardiovascular hypertension, Pt. on medications and Pt. on home beta blockers Normal cardiovascular exam  Hx of MSSA endocarditis s/p AVR 10/07/18   Neuro/Psych Anxiety Cognitive impairmentnegative neurological ROS     GI/Hepatic Neg liver ROS, GERD  Medicated and Controlled,  Endo/Other  Hypothyroidism   Renal/GU negative Renal ROS     Musculoskeletal negative musculoskeletal ROS (+)   Abdominal   Peds  Hematology negative hematology ROS (+)   Anesthesia Other Findings Day of surgery medications reviewed with the patient.  Reproductive/Obstetrics                            Anesthesia Physical Anesthesia Plan  ASA: III  Anesthesia Plan: MAC   Post-op Pain Management:    Induction:   PONV Risk Score and Plan: Treatment may vary due to age or medical condition and Propofol infusion  Airway Management Planned: Natural Airway and Nasal Cannula  Additional Equipment:   Intra-op Plan:   Post-operative Plan:   Informed Consent: I have reviewed the patients History and Physical, chart, labs and discussed the procedure including the risks, benefits and alternatives for the proposed anesthesia with the patient or authorized representative who has indicated his/her understanding and acceptance.     Dental advisory given  Plan Discussed with: CRNA  Anesthesia Plan  Comments:        Anesthesia Quick Evaluation

## 2018-11-03 ENCOUNTER — Encounter (HOSPITAL_COMMUNITY)
Admission: EM | Disposition: A | Discharge status: Skilled Nursing Facility | Payer: Self-pay | Source: Home / Self Care | Attending: Thoracic Surgery (Cardiothoracic Vascular Surgery)

## 2018-11-03 ENCOUNTER — Inpatient Hospital Stay (HOSPITAL_COMMUNITY): Payer: Medicaid Other | Admitting: Certified Registered Nurse Anesthetist

## 2018-11-03 ENCOUNTER — Encounter (HOSPITAL_COMMUNITY): Payer: Self-pay

## 2018-11-03 DIAGNOSIS — R630 Anorexia: Secondary | ICD-10-CM

## 2018-11-03 DIAGNOSIS — K3189 Other diseases of stomach and duodenum: Secondary | ICD-10-CM

## 2018-11-03 DIAGNOSIS — K449 Diaphragmatic hernia without obstruction or gangrene: Secondary | ICD-10-CM

## 2018-11-03 HISTORY — PX: BIOPSY: SHX5522

## 2018-11-03 HISTORY — PX: ESOPHAGOGASTRODUODENOSCOPY (EGD) WITH PROPOFOL: SHX5813

## 2018-11-03 LAB — URINE CULTURE

## 2018-11-03 LAB — PROTIME-INR
INR: 1.3 — ABNORMAL HIGH (ref 0.8–1.2)
Prothrombin Time: 16.1 seconds — ABNORMAL HIGH (ref 11.4–15.2)

## 2018-11-03 LAB — GLUCOSE, CAPILLARY
Glucose-Capillary: 102 mg/dL — ABNORMAL HIGH (ref 70–99)
Glucose-Capillary: 104 mg/dL — ABNORMAL HIGH (ref 70–99)
Glucose-Capillary: 135 mg/dL — ABNORMAL HIGH (ref 70–99)
Glucose-Capillary: 136 mg/dL — ABNORMAL HIGH (ref 70–99)
Glucose-Capillary: 95 mg/dL (ref 70–99)

## 2018-11-03 SURGERY — ESOPHAGOGASTRODUODENOSCOPY (EGD) WITH PROPOFOL
Anesthesia: Monitor Anesthesia Care

## 2018-11-03 MED ORDER — LIDOCAINE 2% (20 MG/ML) 5 ML SYRINGE
INTRAMUSCULAR | Status: DC | PRN
  Administered 2018-11-03: 60 mg via INTRAVENOUS

## 2018-11-03 MED ORDER — PROPOFOL 10 MG/ML IV BOLUS
INTRAVENOUS | Status: DC | PRN
  Administered 2018-11-03: 20 mg via INTRAVENOUS

## 2018-11-03 MED ORDER — LACTATED RINGERS IV SOLN
INTRAVENOUS | Status: DC
  Administered 2018-11-03: 07:00:00 via INTRAVENOUS

## 2018-11-03 MED ORDER — PROPOFOL 500 MG/50ML IV EMUL
INTRAVENOUS | Status: DC | PRN
  Administered 2018-11-03: 75 ug/kg/min via INTRAVENOUS

## 2018-11-03 SURGICAL SUPPLY — 15 items

## 2018-11-03 NOTE — Transfer of Care (Signed)
Immediate Anesthesia Transfer of Care Note  Patient: Audrey Collier  Procedure(s) Performed: ESOPHAGOGASTRODUODENOSCOPY (EGD) WITH PROPOFOL (N/A ) BIOPSY  Patient Location: Endoscopy Unit  Anesthesia Type:MAC  Level of Consciousness: awake, alert  and oriented  Airway & Oxygen Therapy: Patient Spontanous Breathing and Patient connected to nasal cannula oxygen  Post-op Assessment: Report given to RN and Post -op Vital signs reviewed and stable  Post vital signs: Reviewed and stable  Last Vitals:  Vitals Value Taken Time  BP    Temp    Pulse 88 11/03/18 0803  Resp 22 11/03/18 0803  SpO2 92 % 11/03/18 0803  Vitals shown include unvalidated device data.  Last Pain:  Vitals:   11/03/18 0708  TempSrc: Temporal  PainSc: 0-No pain      Patients Stated Pain Goal: 0 (35/67/01 4103)  Complications: No apparent anesthesia complications

## 2018-11-03 NOTE — Anesthesia Postprocedure Evaluation (Signed)
Anesthesia Post Note  Patient: Audrey Collier  Procedure(s) Performed: ESOPHAGOGASTRODUODENOSCOPY (EGD) WITH PROPOFOL (N/A ) BIOPSY     Patient location during evaluation: PACU Anesthesia Type: MAC Level of consciousness: awake and alert and oriented Pain management: pain level controlled Vital Signs Assessment: post-procedure vital signs reviewed and stable Respiratory status: spontaneous breathing, nonlabored ventilation and respiratory function stable Cardiovascular status: blood pressure returned to baseline Postop Assessment: no apparent nausea or vomiting Anesthetic complications: no    Last Vitals:  Vitals:   11/03/18 0823 11/03/18 0849  BP: 126/78 (!) 144/78  Pulse: 79 80  Resp: 20 (!) 22  Temp:  36.9 C  SpO2:  99%    Last Pain:  Vitals:   11/03/18 0849  TempSrc: Oral  PainSc: 0-No pain                 Brennan Bailey

## 2018-11-03 NOTE — Progress Notes (Signed)
Nutrition Follow-up  DOCUMENTATION CODES:   Not applicable  INTERVENTION:    ContinueMagic cup TID with meals, each supplement provides 290 kcal and 9 grams of protein  Continue snack daily   If PEG placed: -Osmolite 1.5@ 66ml/hr x 24 hrs  -30 ml Prostat BID  Provides:2000kcals, 105grams protein, 980ml free water. Meets 100% of needs. May transition to nocturnal feedings once tolerance is established.   NUTRITION DIAGNOSIS:   Inadequate oral intake related to poor appetite as evidenced by meal completion < 50%.  Ongoing  GOAL:   Patient will meet greater than or equal to 90% of their needs  Not meeting  MONITOR:   PO intake, Supplement acceptance, Diet advancement, Labs, Weight trends, I & O's, Skin  REASON FOR ASSESSMENT:   Consult Enteral/tube feeding initiation and management  ASSESSMENT:   58 yo female admitted with fever, vomiting, weakness. Found to have MSSA bacteremia with PNA. Echo showed vegetation. Required transfer to the ICU and intubation on 8/4. PMH includes developmental delay, GERD, anxiety, hypothyroidism, HLD.  8/06 - TEE showing large aortic valve vegetation 8/07 - s/p aortic valve replacement 8/08 - extubated, diet advanced to Dysphagia 2 8/21 - Cortrak placement(tip in stomach); spoke with TCTS, ok to start TF 9/1- HIDA- negative  Pt went for EGD this am. Awaiting results.   Cortrak pulled out during procedure. Pt feels better without tube and eager to eat. Continues on DYS2 diet with thin liquids. Meal completions remain inconsistent at 0-60% for her last 8 meals (5 meals skipped).   Recommend discussion regarding PEG with family as intake remains inadequate.   Admission weight: 77.7 kg Current weight: 89.7 kg (+3 generalized edema)   I/O: -4,824 ml since 8/20 UOP: 1,350 ml x 24 hrs   Medications: calcium-vit D, MVI with minerals Labs: CBG 90-144  Diet Order:   Diet Order            DIET DYS 2 Room service  appropriate? Yes; Fluid consistency: Thin  Diet effective now              EDUCATION NEEDS:   No education needs have been identified at this time  Skin:  Skin Assessment: Skin Integrity Issues: Skin Integrity Issues:: DTI, Incisions, Other (Comment) DTI: R ear Incisions: chest Other: MASD-arm, buttocks, labia  Last BM:  8/31  Height:   Ht Readings from Last 1 Encounters:  10/07/18 5\' 2"  (1.575 m)    Weight:   Wt Readings from Last 1 Encounters:  11/03/18 89.7 kg    Ideal Body Weight:  56.8 kg  BMI:  Body mass index is 36.17 kg/m.  Estimated Nutritional Needs:   Kcal:  1800-2000  Protein:  100-115 gm  Fluid:  >/= 1.8 L    Mariana Single RD, LDN Clinical Nutrition Pager # [phone]

## 2018-11-03 NOTE — Anesthesia Procedure Notes (Signed)
Procedure Name: MAC Date/Time: 11/03/2018 7:46 AM Performed by: Janene Harvey, CRNA Pre-anesthesia Checklist: Patient identified, Emergency Drugs available and Suction available Patient Re-evaluated:Patient Re-evaluated prior to induction Oxygen Delivery Method: Nasal cannula Dental Injury: Teeth and Oropharynx as per pre-operative assessment

## 2018-11-03 NOTE — Progress Notes (Signed)
Physical Therapy Treatment Patient Details Name: Audrey Collier MRN: [id number] DOB: 08-09-1960 Today's Date: 11/03/2018    History of Present Illness 58 yo female presented with fever, vomiting, weakness.  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  Echo showed vegetation.  Transferred to ICU 8/04 due to respiratory failure.  Underwent AVR with TEE intraoprative on 10/07/18. Extubated 10/08/18.    PT Comments    Pt's Aunt present for session in order to "encourage" pt to participate so that she can discharge directly home. Aunt reports pt has basement bedroom and will need to be independent in mobility and ADLs in order to be alone during the day while Aunt at work. Pt continues to be limited in safe mobility by decreased strength and endurance in presence of decreased cognition and decreased awareness of deficits. Pt is currently modx2 for bed mobility and maxAx2 for standing up with use of Stedy. Focus of session on building standing endurance. Pt able to come to standing 4x and was able to stand for a max of 10 seconds. PT continues to recommend SNF level placement for pt to build strength and endurance to be able to mobilize safely in her home environment. CSW reports that pt will be hard to place so therapy will increase frequency to 3x per week and will no longer co-treat with OT in order to maximize therapeutic intervention.      Follow Up Recommendations  SNF     Equipment Recommendations  Other (comment)(TBD)       Precautions / Restrictions Precautions Precautions: Fall;Sternal Restrictions Weight Bearing Restrictions: Yes Other Position/Activity Restrictions: sternal precautions     Mobility  Bed Mobility Overal bed mobility: Needs Assistance Bed Mobility: Supine to Sit;Sit to Supine     Supine to sit: Mod assist;+2 for physical assistance;+2 for safety/equipment;HOB elevated     General bed mobility comments: Mod A for trunk elevation and support with coming to EoB  given decreased core strength.   Transfers Overall transfer level: Needs assistance   Transfers: Sit to/from Stand Sit to Stand: Max assist;Mod assist         General transfer comment: maxAx2 for transfer to standing from regular height bed, min-modA for additional 4x attempts at standing in [place], pt with decreased ability for posterior pelvic tilt and upright posture, able to stand for a maximum of 10 sec before sitting back down        Balance Overall balance assessment: Needs assistance Sitting-balance support: No upper extremity supported;Feet supported Sitting balance-Leahy Scale: Fair Sitting balance - Comments: min guard to min assist, preference to UE support    Standing balance support: Bilateral upper extremity supported;During functional activity Standing balance-Leahy Scale: Poor Standing balance comment: Aof 2 for standing for safety                            Cognition Arousal/Alertness: Awake/alert Behavior During Therapy: Flat affect Overall Cognitive Status: Impaired/Different from baseline Area of Impairment: Attention;Following commands;Safety/judgement;Problem solving;Awareness                   Current Attention Level: Selective   Following Commands: Follows one step commands inconsistently;Follows one step commands with increased time   Awareness: Intellectual Problem Solving: Slow processing;Requires verbal cues;Requires tactile cues;Decreased initiation;Difficulty sequencing General Comments: continues to require increased time for processing and initiation       Exercises Other Exercises Other Exercises: standing balance 4x max 10 sec  General Comments General comments (skin integrity, edema, etc.): Aunt pt lives with, present for session to "encourage" pt to participate. Not clear that aunt's presence is incentive for participation. Edema in pt's hands and feet improved today, and pt moving extremities more.        Pertinent Vitals/Pain Pain Assessment: Faces Faces Pain Scale: Hurts a little bit Pain Location: abdomen (obviously tight) Pain Descriptors / Indicators: Discomfort;Grimacing Pain Intervention(s): Limited activity within patient's tolerance;Monitored during session;Repositioned           PT Goals (current goals can now be found in the care plan section) Acute Rehab PT Goals Patient Stated Goal: none stated today PT Goal Formulation: With patient Time For Goal Achievement: 11/01/18 Potential to Achieve Goals: Fair Progress towards PT goals: Progressing toward goals    Frequency    Min 2X/week      PT Plan Current plan remains appropriate       AM-PAC PT "6 Clicks" Mobility   Outcome Measure  Help needed turning from your back to your side while in a flat bed without using bedrails?: A Little Help needed moving from lying on your back to sitting on the side of a flat bed without using bedrails?: A Lot Help needed moving to and from a bed to a chair (including a wheelchair)?: A Lot Help needed standing up from a chair using your arms (e.g., wheelchair or bedside chair)?: A Lot Help needed to walk in hospital room?: Total Help needed climbing 3-5 steps with a railing? : Total 6 Click Score: 11    End of Session Equipment Utilized During Treatment: Gait belt;Oxygen Activity Tolerance: Patient tolerated treatment well Patient left: in chair;with call bell/phone within reach;with family/visitor present(transporter in room) Nurse Communication: Mobility status PT Visit Diagnosis: Muscle weakness (generalized) (M62.81);Other abnormalities of gait and mobility (R26.89);Pain Pain - part of body: (abdomen)     Time: [id number] PT Time Calculation (min) (ACUTE ONLY): 43 min  Charges:  $Therapeutic Exercise: 23-37 mins $Self Care/Home Management: 8-22                     Audrey Collier PT, DPT Acute Rehabilitation Services Pager [phone] Office [phone])  [phone]    Audrey Collier 11/03/2018, 6:12 PM

## 2018-11-03 NOTE — Op Note (Signed)
[facility] Patient Name: Audrey RoysSheila Collier Procedure Date : 11/03/2018 MRN: [id number] Attending MD: Tressia DanasKimberly Aunesty Tyson MD, MD Date of Birth: [birth year] CSN: [id number] Age: 58 Admit Type: Inpatient Procedure:                Upper GI endoscopy Indications:              Left-sided Abdominal pain, Anorexia - not meeting                            caloric needs Providers:                Tressia DanasKimberly Sari Cogan MD, MD, Roselie AwkwardShannon Love, RN, Lanna PocheJustin                            Carter, Technician, Beryle BeamsJanie Billups, Technician,                            Arva ChafeBlaire Harder, CRNA Referring MD:              Medicines:                See the Anesthesia note for documentation of the                            administered medications Complications:            No immediate complications. Estimated blood loss:                            Minimal. Estimated Blood Loss:     Estimated blood loss was minimal. Procedure:                Pre-Anesthesia Assessment:                           - Prior to the procedure, a History and Physical                            was performed, and patient medications and                            allergies were reviewed. The patient's tolerance of                            previous anesthesia was also reviewed. The risks                            and benefits of the procedure and the sedation                            options and risks were discussed with the patient.                            All questions were answered, and informed consent                            was obtained. Prior  Anticoagulants: The patient has                            taken no previous anticoagulant or antiplatelet                            agents. ASA Grade Assessment: III - A patient with                            severe systemic disease. After reviewing the risks                            and benefits, the patient was deemed in                            satisfactory condition to undergo the  procedure.                           After obtaining informed consent, the endoscope was                            passed under direct vision. Throughout the                            procedure, the patient's blood pressure, pulse, and                            oxygen saturations were monitored continuously. The                            GIF-H190 ([id number]) Olympus gastroscope was                            introduced through the mouth, and advanced to the                            third part of duodenum. The upper GI endoscopy was                            accomplished without difficulty. The patient                            tolerated the procedure well. Scope In: Scope Out: Findings:      The examined esophagus was normal. An enteral feeding tube coursed       through the esophagus with the tip in the midgastric body. The tube was       ultimately removed during the procedure.      Striped mildly erythematous mucosa without bleeding was found in the       gastric antrum. There were several small polyps in the fundus that       appeared to be small fundic gland polyps. Multiple biopsies were taken       with a cold forceps for histology from the antrum and fundus. Estimated       blood loss was  minimal.      A small hiatal hernia was present.      The examined duodenum was normal.      The exam was otherwise without abnormality. Impression:               - Normal esophagus.                           - Erythematous mucosa in the antrum. Biopsied.                           - Small hiatal hernia.                           - Normal examined duodenum.                           - The examination was otherwise normal. No source                            for her symptoms identicified endoscopically. Await                            gastric biopsy results. Recommendation:           - Return patient to hospital ward for ongoing care.                           - Resume previous diet  today.                           - Continue present medications. Continue to hold                            Lovenox in the event that gastrostomy tube                            placement in IR is planned for later today.                           - Await pathology results from the gastric biopsies. Procedure Code(s):        --- Professional ---                           [phone], Esophagogastroduodenoscopy, flexible,                            transoral; with biopsy, single or multiple Diagnosis Code(s):        --- Professional ---                           K31.89, Other diseases of stomach and duodenum                           K44.9, Diaphragmatic hernia without obstruction or  gangrene                           R10.9, Unspecified abdominal pain                           R63.0, Anorexia CPT copyright 2019 [employer]. All rights reserved. The codes documented in this report are preliminary and upon coder review may  be revised to meet current compliance requirements. Thornton Park MD, MD 11/03/2018 8:08:44 AM This report has been signed electronically. Number of Addenda: 0

## 2018-11-03 NOTE — Progress Notes (Signed)
      [address]       [place] [address]             [phone]      Day of Surgery Procedure(s) (LRB): ESOPHAGOGASTRODUODENOSCOPY (EGD) WITH PROPOFOL (N/A) BIOPSY   Subjective:  Doing okay no new complaints.  Happy the feeding tube is out of her nose.  Objective: Vital signs in last 24 hours: Temp:  [98 F (36.7 C)-99.1 F (37.3 C)] 98.5 F (36.9 C) (09/03 0849) Pulse Rate:  [76-89] 80 (09/03 0849) Cardiac Rhythm: Normal sinus rhythm (09/03 0849) Resp:  [16-25] 22 (09/03 0849) BP: (122-166)/(73-88) 144/78 (09/03 0849) SpO2:  [91 %-99 %] 99 % (09/03 0849) Weight:  [89.7 kg] 89.7 kg (09/03 0421)  Intake/Output from previous day: 09/02 0701 - 09/03 0700 In: 1460.9 [P.O.:240; NG/GT:321; IV Piggyback:899.9] Out: 1350 [ATFTD:3220] Intake/Output this shift: Total I/O In: 200 [I.V.:200] Out: 30 [Urine:30]  General appearance: alert, cooperative and no distress Heart: regular rate and rhythm Lungs: clear to auscultation bilaterally Abdomen: soft, non-tender; bowel sounds normal; no masses,  no organomegaly Wound: clean and dry  Lab Results: Recent Labs    11/01/18 0530  WBC 6.0  HGB 7.6*  HCT 25.2*  PLT 145*   BMET:  Recent Labs    11/01/18 0530  NA 140  K 4.2  CL 102  CO2 27  GLUCOSE 102*  BUN 41*  CREATININE 1.65*  CALCIUM 8.5*    PT/INR: No results for input(s): LABPROT, INR in the last 72 hours. ABG    Component Value Date/Time   PHART 7.430 10/08/2018 0920   HCO3 23.6 10/08/2018 0920   TCO2 25 10/08/2018 0920   ACIDBASEDEF 6.0 (H) 10/08/2018 0307   O2SAT 94.0 10/08/2018 0920   CBG (last 3)  Recent Labs    11/02/18 2024 11/03/18 0029 11/03/18 0438  GLUCAP 99 135* 102*    Assessment/Plan: S/P Procedure(s) (LRB): ESOPHAGOGASTRODUODENOSCOPY (EGD) WITH PROPOFOL (N/A) BIOPSY  1. CV- NSR in the 80s- continue Lopressor, HTN improved with addition of Norvasc 2. Pulmonary no acute issues 3. Renal- creatinine remains  stable, weight is stable 4. 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 GI- oral intake remains poor, cortrak was removed during EGD today, hopefully she can get feeding tube by IR later today, continue to encourage oral intake 6. Urinary retention- on flomax, repeat urine culture again shows multiple species likely due to contamination 7. Hypothyroidism, TSH was >19 on 8/31, would likely benefit from increase in synthroid dose, 8. Deconditioning- continues to require SNF placement, no bed offers currently 9. Dispo- patient stable, EGD done this morning, hopefully for feeding tube by IR this afternoon,repeat urinary culture again shows contamination, may benefit from adjustment of synthroid with elevated TSH, continue to work on SNF placement, continue current care   LOS: 33 days    Ellwood Handler 11/03/2018

## 2018-11-03 NOTE — TOC Progression Note (Signed)
Transition of Care [facility]) - Progression Note    Patient Details  Name: HARLI ENGELKEN MRN: [id number] Date of Birth: January 10, 1961  [facility] CM/SW [place], [place] Phone Number: 11/03/2018, 6:47 PM  Clinical Narrative:     CSW  Spoke with the patient's [name], to discuss  Dr. Kipp Brood suggestion to possibly have PT come when family is here and able to encourage and motivate the patient. CSW talked with the patient's Aunt [name] and [name] to inquire if that was a possible option before contacting PT. CSW explained to the family not sure  If PT can accommodate but CSW would inquire. CSW discussed patient with PT Benjamine Mola, and she agreed to see the patient today and talk with the patient's [name].   Patient's Aunt [name] has expressed concerns if the patient can not walk, they are unable to care for her at home. CSW will continue to look for SNF placement. Patient has no bed offers. Family has been updated.  CSW will continue to follow and assist with discharge planning.    [name], MSW, LCSWA Clinical Social Worker [phone]     Barriers to Discharge: Continued Medical Work up  Expected Discharge Plan and Services         Living arrangements for the past 2 months: Single Family Home                                       Social Determinants of Health (SDOH) Interventions    Readmission Risk Interventions No flowsheet data found.

## 2018-11-04 ENCOUNTER — Encounter (HOSPITAL_COMMUNITY): Payer: Self-pay | Admitting: Interventional Radiology

## 2018-11-04 ENCOUNTER — Inpatient Hospital Stay (HOSPITAL_COMMUNITY): Payer: Medicaid Other

## 2018-11-04 DIAGNOSIS — K319 Disease of stomach and duodenum, unspecified: Secondary | ICD-10-CM

## 2018-11-04 HISTORY — PX: IR GASTROSTOMY TUBE MOD SED: IMG625

## 2018-11-04 LAB — GLUCOSE, CAPILLARY
Glucose-Capillary: 69 mg/dL — ABNORMAL LOW (ref 70–99)
Glucose-Capillary: 84 mg/dL (ref 70–99)
Glucose-Capillary: 87 mg/dL (ref 70–99)
Glucose-Capillary: 88 mg/dL (ref 70–99)
Glucose-Capillary: 92 mg/dL (ref 70–99)
Glucose-Capillary: 94 mg/dL (ref 70–99)
Glucose-Capillary: 95 mg/dL (ref 70–99)
Glucose-Capillary: 97 mg/dL (ref 70–99)

## 2018-11-04 MED ORDER — LIDOCAINE HCL 1 % IJ SOLN
INTRAMUSCULAR | Status: AC
  Filled 2018-11-04: qty 20

## 2018-11-04 MED ORDER — CEFAZOLIN SODIUM-DEXTROSE 2-4 GM/100ML-% IV SOLN
INTRAVENOUS | Status: AC
  Administered 2018-11-05: 16:00:00 2 g via INTRAVENOUS
  Filled 2018-11-04: qty 100

## 2018-11-04 MED ORDER — MIDAZOLAM HCL 2 MG/2ML IJ SOLN
INTRAMUSCULAR | Status: AC
  Filled 2018-11-04: qty 2

## 2018-11-04 MED ORDER — IOHEXOL 300 MG/ML  SOLN
50.0000 mL | Freq: Once | INTRAMUSCULAR | Status: AC | PRN
  Administered 2018-11-04: 10 mL

## 2018-11-04 MED ORDER — MIDAZOLAM HCL 2 MG/2ML IJ SOLN
INTRAMUSCULAR | Status: AC | PRN
  Administered 2018-11-04: 1 mg via INTRAVENOUS

## 2018-11-04 MED ORDER — LEVOTHYROXINE SODIUM 75 MCG PO TABS
150.0000 ug | ORAL_TABLET | Freq: Every day | ORAL | Status: DC
  Administered 2018-11-06 – 2018-12-06 (×31): 150 ug via ORAL
  Filled 2018-11-04 (×33): qty 2

## 2018-11-04 MED ORDER — FENTANYL CITRATE (PF) 100 MCG/2ML IJ SOLN
INTRAMUSCULAR | Status: AC | PRN
  Administered 2018-11-04: 50 ug via INTRAVENOUS

## 2018-11-04 MED ORDER — LIDOCAINE HCL 1 % IJ SOLN
INTRAMUSCULAR | Status: AC | PRN
  Administered 2018-11-04: 5 mL

## 2018-11-04 MED ORDER — ENOXAPARIN SODIUM 40 MG/0.4ML ~~LOC~~ SOLN
40.0000 mg | SUBCUTANEOUS | Status: DC
  Administered 2018-11-05 – 2018-11-06 (×2): 40 mg via SUBCUTANEOUS
  Filled 2018-11-04 (×3): qty 0.4

## 2018-11-04 MED ORDER — FENTANYL CITRATE (PF) 100 MCG/2ML IJ SOLN
INTRAMUSCULAR | Status: AC
  Filled 2018-11-04: qty 2

## 2018-11-04 MED ORDER — GLUCAGON HCL RDNA (DIAGNOSTIC) 1 MG IJ SOLR
INTRAMUSCULAR | Status: AC
  Filled 2018-11-04: qty 1

## 2018-11-04 MED ORDER — DEXTROSE-NACL 5-0.45 % IV SOLN
INTRAVENOUS | Status: DC
  Administered 2018-11-04 – 2018-11-09 (×5): via INTRAVENOUS

## 2018-11-04 NOTE — Progress Notes (Signed)
Morphine 4mg  was pulled from pyxis in error as there were two available morphine orders.  Outer packaging of morphine was opened before this was noticed, so morphine 4 mg was wasted with charge nurse Garner Nash RN.  Correct morphine 1 mg was then pulled from pyxis and administered to pt.

## 2018-11-04 NOTE — Progress Notes (Signed)
GI Progress Note  Reviewed pathology results from recent EGD with the patient.   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 HYPERPLASTIC POLYP. - WARTHIN-STARRY STAIN IS NEGATIVE FOR HELICOBACTER PYLORI.  I have no new recommendations at this time. No specific follow-up indicated based on the new AGA Guidelines for intestinal metaplasia.  Please call the on-call gastroenterologist with any additional questions or concerns during this hospitalization.

## 2018-11-04 NOTE — Progress Notes (Signed)
Notified Dr. Prescott Gum that patient did not want to take her medicine d/t her stomach still being upset. repaeat CBG was 88. Will continue to monitor

## 2018-11-04 NOTE — Consult Note (Signed)
Chief Complaint: Patient was seen in consultation today for anorexia  Referring Physician(s): Dr. Kipp Brood  Supervising Physician: Aletta Edouard  Patient Status: [facility] - In-pt  History of Present Illness: Audrey Collier is a 58 y.o. female with history of GERD, anxiety, and cognitive delay admitted to [facility] 10/01/18 with fever.  She was found to have endocarditis and underwent open valve repair 10/07/18.  She has remained hospitalized with slow recovery complicated by poor appetite and intake.  She required an NGT for enteral nutrition support.  Calorie counts by RD showed ongoing poor intake and therefore IR was consulted for percutaneous gastrostomy tube placement.  A CT Abdomen was reviewed by Dr. Anselm Pancoast for possible placement and he noted perihepatic fluid as well as unusual biliary anatomy.  GI has pursued extensive work-up including US Abdomen, HIDA scan, as well as EGD without obvious abnormality or condition to explain her poor appetite.  Plan is to proceed with gastrostomy tube at this time.   PA met with patient at bedside.  She can speak for herself and provides accurate history but appears shy and does not fully participate in conversation.  Discussed with aunt who reports good PO intake PTA.   Case reviewed by Dr. Kathlene Cote who has approved anatomy for possible percutaneous gastrostomy tube placement.  Patient NPO.  Lovenox was held yesterday, however appears she got an evening dose.    Past Medical History:  Diagnosis Date   Anxiety    Dizziness    Endometrial polyp 03/26/2014   Fecal occult blood test positive 03/15/2013   will send 3 cards home and refer to GI for colonoscopy   GERD (gastroesophageal reflux disease)    Hypothyroidism    Mental disorder    mentally challenged   PMB (postmenopausal bleeding) 03/26/2014   Thickened endometrium 03/26/2014   ?polyp will get HSG    Past Surgical History:  Procedure Laterality Date   AORTIC VALVE REPLACEMENT N/A  10/07/2018   Procedure: AORTIC VALVE REPLACEMENT (AVR);  Surgeon: Lajuana Matte, MD;  Location: [place];  Service: Open Heart Surgery;  Laterality: N/A;   CATARACT EXTRACTION W/PHACO Left 08/06/2017   Procedure: CATARACT EXTRACTION PHACO AND INTRAOCULAR LENS PLACEMENT LEFT EYE;  Surgeon: Baruch Goldmann, MD;  Location: [facility];  Service: Ophthalmology;  Laterality: Left;  CDE: 12.63   CATARACT EXTRACTION W/PHACO Right 10/01/2017   Procedure: CATARACT EXTRACTION PHACO AND INTRAOCULAR LENS PLACEMENT (IOC);  Surgeon: Baruch Goldmann, MD;  Location: [facility];  Service: Ophthalmology;  Laterality: Right;  CDE: 5.77   COLONOSCOPY N/A 12/01/2013   Dr. Fields:moderate sized internal hemorrhoids/left colon is redundant   [place] N/A 04/18/2014   Procedure: DILATATION & CURETTAGE/HYSTEROSCOPY WITH NOVASURE ENDOMETRIAL ABLATION;  Surgeon: Florian Buff, MD;  Location: [facility];  Service: Gynecology;  Laterality: N/A;  Uterine Cavity Length 5cm      ESOPHAGOGASTRODUODENOSCOPY N/A 12/01/2013   EML:JQGB non erosive gastritis. +H.pylori    NO PAST SURGERIES     POLYPECTOMY N/A 04/18/2014   Procedure: ENDOMETRIAL POLYPECTOMY;  Surgeon: Florian Buff, MD;  Location: [facility];  Service: Gynecology;  Laterality: N/A;   TEE WITHOUT CARDIOVERSION N/A 10/07/2018   Procedure: TRANSESOPHAGEAL ECHOCARDIOGRAM (TEE);  Surgeon: Lajuana Matte, MD;  Location: [place];  Service: Open Heart Surgery;  Laterality: N/A;    Allergies: Patient has no known allergies.  Medications: Prior to Admission medications   Medication Sig Start Date End Date Taking? Authorizing Provider  Calcium Citrate-Vitamin D (  CALCIUM + D PO) Take 1 tablet by mouth daily.   Yes [provider]  cetirizine (ZYRTEC) 10 MG tablet Take 10 mg by mouth daily.   Yes [provider]  diclofenac sodium (VOLTAREN) 1 % GEL Apply 1 application topically every 6 (six) hours as needed (heel pain).    Yes [provider]  docusate sodium (STOOL SOFTENER) 100 MG capsule Take 100 mg by mouth every Monday, Wednesday, and Friday.   Yes [provider]  FLUoxetine (PROZAC) 10 MG tablet Take 10 mg by mouth at bedtime.    Yes [provider]  fluticasone (FLONASE) 50 MCG/ACT nasal spray Place 1 spray into both nostrils 2 (two) times daily as needed for allergies.    Yes [provider]  levothyroxine (SYNTHROID) 125 MCG tablet Take 125 mcg by mouth daily before breakfast.    Yes [provider]  montelukast (SINGULAIR) 10 MG tablet Take 10 mg by mouth at bedtime.   Yes [provider]  Multiple Vitamin (MULTIVITAMIN) tablet Take 1 tablet by mouth daily.   Yes [provider]  omeprazole (PRILOSEC) 20 MG capsule TAKE 1 CAPSULE BY MOUTH ONCE DAILY. 07/23/15  Yes Annitta Needs, NP  rosuvastatin (CRESTOR) 5 MG tablet Take 5 mg by mouth daily.   Yes [provider]  aspirin EC 325 MG EC tablet Take 1 tablet (325 mg total) by mouth daily. 10/21/18   Nani Skillern, PA-C  ceFAZolin (ANCEF) IVPB Inject 2 g into the vein every 8 (eight) hours. Indication: MSSA endocarditis Last Day of Therapy:  11/17/18 Labs - Once weekly:  CBC/D and BMP, Labs - Every other week:  ESR and CRP 10/20/18 11/25/18  Lars Pinks M, PA-C  ferrous sulfate 325 (65 FE) MG tablet Take 1 tablet (325 mg total) by mouth daily with breakfast. For one month then stop. 10/21/18   Nani Skillern, PA-C  metolazone (ZAROXOLYN) 5 MG tablet Take 1 tablet (5 mg total) by mouth daily. 10/21/18   Nani Skillern, PA-C  metoprolol tartrate (LOPRESSOR) 25 MG tablet Take 1 tablet (25 mg total) by mouth 2 (two) times daily. 10/20/18   Nani Skillern, PA-C  potassium chloride SA (K-DUR) 20 MEQ tablet Take 1 tablet (20 mEq total) by mouth daily. 10/21/18   Nani Skillern, PA-C     Family History  Problem Relation Age of Onset   Diabetes Maternal  Aunt    Hypertension Maternal Aunt    Diabetes Maternal Uncle    Colon cancer Neg Hx     Social History   Socioeconomic History   Marital status: Single    Spouse name: Not on file   Number of children: Not on file   Years of education: Not on file   Highest education level: Not on file  Occupational History   Not on file  Social Needs   Financial resource strain: Not on file   Food insecurity    Worry: Not on file    Inability: Not on file   Transportation needs    Medical: Not on file    Non-medical: Not on file  Tobacco Use   Smoking status: Never Smoker   Smokeless tobacco: Never Used   Tobacco comment: Never smoked  Substance and Sexual Activity   Alcohol use: No    Alcohol/week: 0.0 standard drinks   Drug use: No   Sexual activity: Never  Lifestyle   Physical activity    Days per week: Not  on file    Minutes per session: Not on file   Stress: Not on file  Relationships   Social connections    Talks on phone: Not on file    Gets together: Not on file    Attends religious service: Not on file    Active member of club or organization: Not on file    Attends meetings of clubs or organizations: Not on file    Relationship status: Not on file  Other Topics Concern   Not on file  Social History Narrative   Not on file     Review of Systems: A 12 point ROS discussed and pertinent positives are indicated in the HPI above.  All other systems are negative.  Review of Systems  Unable to perform ROS: Other  Limited due to patient's willingness to participate likely due to cognitive delay.   Vital Signs: BP 137/77 (BP Location: Right Arm)    Pulse 73    Temp 97.7 F (36.5 C) (Oral)    Resp (!) 23    Ht 5' 2"  (1.575 m)    Wt 194 lb 3.6 oz (88.1 kg)    LMP 04/16/2013 (Approximate)    SpO2 100%    BMI 35.52 kg/m   Physical Exam       Imaging: Ct Abdomen Wo Contrast  Result Date: 10/31/2018 CLINICAL DATA:  Evaluate anatomy for  percutaneous gastrostomy tube placement. History of aortic valve replacement. Poor appetite and nutrition. EXAM: CT ABDOMEN WITHOUT CONTRAST TECHNIQUE: Multidetector CT imaging of the abdomen was performed following the standard protocol without IV contrast. COMPARISON:  CT chest 10/05/2018.  CT abdomen 12/05/2014 FINDINGS: Lower chest: Small to moderate sized right pleural effusion with compressive atelectasis in the right lower lobe. Aortic valve replacement. Patchy airspace densities in left lower lobe are poorly visualized due to motion artifact. Findings are suggestive for infectious or inflammatory process. Hepatobiliary: Gallbladder is at least moderately distended and appears to be contiguous with a small amount of perihepatic fluid. There was perihepatic fluid and gallbladder distention on the CT from 10/05/2018. Perihepatic fluid may be loculated with a component along the right inferior aspect of the liver and another component along the anterior liver and adjacent to the gallbladder. Difficult to exclude gallbladder inflammation. Limited evaluation for a hepatic lesion on this non contrast examination. Pancreas: Unremarkable. No pancreatic ductal dilatation or surrounding inflammatory changes. Spleen: Normal in size without focal abnormality. Adrenals/Urinary Tract: Normal adrenal glands. Normal appearance of both kidneys. No hydronephrosis. Stomach/Bowel: Nasogastric tube tip in the distal stomach body region. Transverse colon is caudal to the stomach. There should be a percutaneous window for gastrostomy tube placement after stomach insufflation. No evidence for bowel dilatation. Mild mesenteric edema in the right upper abdomen near the liver and duodenum. Vascular/Lymphatic: Visualized vascular structures are unremarkable. No significant lymph node enlargement. Other: Subcutaneous edema.  Negative for free air. Musculoskeletal: Prior median sternotomy. IMPRESSION: 1. Anatomy should be amendable for  percutaneous gastrostomy tube placement. Nasogastric tube tip in the distal stomach body region. 2. Perihepatic fluid collections with at least mild gallbladder distension. One perihepatic collection appears to be contiguous with the gallbladder and cannot exclude gallbladder inflammation. Recommend further characterization of the gallbladder and perihepatic space with a right upper quadrant ultrasound. 3. Right pleural effusion with compressive atelectasis in the right lower lobe. Poorly characterized patchy densities in left lower lobe due to motion artifact but concerning for residual inflammation or infection in left lower lobe. Electronically Signed  By: Markus Daft M.D.   On: 10/31/2018 15:29   Dg Chest 2 View  Result Date: 10/12/2018 CLINICAL DATA:  Recent aortic valve replacement EXAM: CHEST - 2 VIEW COMPARISON:  10/11/2018 FINDINGS: Changes consistent with prior AVR. Cardiac shadow remains enlarged. The lungs are well aerated with mild bibasilar atelectasis. No focal infiltrate is seen. Small effusions are noted bilaterally as well. IMPRESSION: Small effusions and mild bibasilar atelectasis. Electronically Signed   By: Inez Catalina M.D.   On: 10/12/2018 12:25   Ct Head Wo Contrast  Result Date: 10/07/2018 CLINICAL DATA:  Altered mental status and aortic valve endocarditis. EXAM: CT HEAD WITHOUT CONTRAST TECHNIQUE: Contiguous axial images were obtained from the base of the skull through the vertex without intravenous contrast. COMPARISON:  None. FINDINGS: Brain: There is no mass, hemorrhage or extra-axial collection. The size and configuration of the ventricles and extra-axial CSF spaces are normal. The brain parenchyma is normal, without acute or chronic infarction. Vascular: No abnormal hyperdensity of the major intracranial arteries or dural venous sinuses. No intracranial atherosclerosis. Skull: The visualized skull base, calvarium and extracranial soft tissues are normal. Sinuses/Orbits: No fluid  levels or advanced mucosal thickening of the visualized paranasal sinuses. No mastoid or middle ear effusion. The orbits are normal. IMPRESSION: Normal head CT. Electronically Signed   By: Ulyses Jarred M.D.   On: 10/07/2018 01:42   Nm Hepatobiliary Liver Func  Result Date: 11/01/2018 CLINICAL DATA:  Right upper quadrant pain EXAM: NUCLEAR MEDICINE HEPATOBILIARY IMAGING TECHNIQUE: Sequential images of the abdomen were obtained out to 60 minutes following intravenous administration of radiopharmaceutical. 3 mg of morphine administered and additional images for 30 minutes obtained. RADIOPHARMACEUTICALS:  5.1 mCi Tc-37m Choletec IV COMPARISON:  Ultrasound 11/01/2018, CT 10/31/2018 FINDINGS: Prompt uptake and biliary excretion of activity by the liver is seen. Gallbladder is nonvisualized after 60 minutes of imaging. Following intravenous administration of morphine, there is visualization of the gallbladder within 30 minutes period. Biliary activity passes into small bowel, consistent with patent common bile duct. IMPRESSION: Negative for acute cholecystitis. Electronically Signed   By: KDonavan FoilM.D.   On: 11/01/2018 19:31   UKoreaAbdomen Limited  Result Date: 11/01/2018 CLINICAL DATA:  Abdominal pain EXAM: ULTRASOUND ABDOMEN LIMITED RIGHT UPPER QUADRANT COMPARISON:  CT abdomen and pelvis 10/31/2018 FINDINGS: Gallbladder: Mildly distended gallbladder containing a 15 mm gallstone and small amount of sludge. Upper normal gallbladder wall thickness. Perihepatic and pericholecystic fluid identified. No sonographic Murphy sign. Common bile duct: Diameter: 5 mm Liver: Normal echogenicity without mass or nodularity. Portal vein is patent on color Doppler imaging with normal direction of blood flow towards the liver. Other: Perihepatic and pericholecystic free fluid. Small focal fluid collection identified adjacent to the gallbladder 6.7 x 1.9 x 4.6 cm. IMPRESSION: Mildly distended gallbladder with a 15 mm gallstone,  small amount of sludge, and pericholecystic and perihepatic free fluid. No definite sonographic MPercell Millersign is identified; however, cannot exclude acute cholecystitis. Consider follow-up radionuclide hepatobiliary imaging to exclude acute cholecystitis. Electronically Signed   By: MLavonia DanaM.D.   On: 11/01/2018 07:48   Dg Chest Port 1 View  Result Date: 10/15/2018 CLINICAL DATA:  Pneumonia EXAM: PORTABLE CHEST 1 VIEW COMPARISON:  Sternotomy wires overlie normal cardiac silhouette. FINDINGS: Sternotomy wires overlie normal cardiac silhouette. Normal pulmonary vasculature. Mild opacity at the LEFT lung base representing residual atelectasis or pneumonia. Upper lungs clear. No acute osseous abnormality. IMPRESSION: Persistent LEFT lung base atelectasis versus pneumonia. Electronically Signed  By: Suzy Bouchard M.D.   On: 10/15/2018 10:30   Dg Chest Port 1 View  Result Date: 10/11/2018 CLINICAL DATA:  AVR.  Sore chest. EXAM: PORTABLE CHEST 1 VIEW COMPARISON:  10/09/2018. FINDINGS: Interim removal of right IJ sheath. Subcutaneous emphysema noted over the right neck. Prior AVR. Stable cardiomegaly. Mild pulmonary venous congestion bilateral interstitial prominence. Small bilateral pleural effusions. Findings suggest mild CHF. Low lung volumes with bibasilar atelectasis no pneumothorax. IMPRESSION: 1. Interim removal of right IJ sheath. Right neck subcutaneous emphysema noted. No pneumothorax noted. 2. Prior AVR. Stable cardiomegaly. Mild pulmonary venous congestion and bilateral interstitial prominence. Small bilateral pleural effusions. Findings suggest CHF. 3.  Low lung volumes with mild bibasilar atelectasis. Electronically Signed   By: Marcello Moores  Register   On: 10/11/2018 08:14   Dg Chest Port 1 View  Result Date: 10/09/2018 CLINICAL DATA:  58 year old female with history of aortic valve replacement on 10/07/2018. EXAM: PORTABLE CHEST 1 VIEW COMPARISON:  Chest x-ray 10/08/2018. FINDINGS: Previously  noted endotracheal tube has been removed. 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 Previously noted Swan-Ganz catheter has been removed. Previously noted left-sided chest tube and mediastinal/pericardial drain have been removed. Lung volumes have increased. Decreasing bibasilar opacities most compatible with resolving areas of bibasilar postoperative subsegmental atelectasis. Trace bilateral pleural effusions, decreased compared to the prior examination. No evidence of pulmonary edema. Mild enlargement of the cardiopericardial silhouette, within normal limits in this postoperative patient. Upper mediastinal contours are within normal limits. Status post median sternotomy for aortic valve replacement (a stented bioprosthesis projects over the expected location of the aortic valve). IMPRESSION: 1. Postoperative changes and support apparatus, as above. 2. Improving lung volumes with resolving bibasilar subsegmental atelectasis and decreasing trace bilateral pleural effusions. Electronically Signed   By: Vinnie Langton M.D.   On: 10/09/2018 08:11   Dg Chest Port 1 View  Result Date: 10/08/2018 CLINICAL DATA:  58 year old female status post aortic valve replacement. EXAM: PORTABLE CHEST 1 VIEW COMPARISON:  Chest x-ray 10/07/2018. FINDINGS: Endotracheal tube has been advanced, now at the level of the carina. A nasogastric tube is seen extending into the stomach, however, the tip of the nasogastric tube extends below the lower margin of the image. Right IJ Cordis through which a Swan-Ganz catheter has been passed into the distal pulmonic trunk or proximal left main pulmonary artery. Left-sided chest tube in position with tip projecting over the lower left hemithorax. Lung volumes are low. Extensive bibasilar opacities favored to reflect predominantly resolving bibasilar areas of postoperative atelectasis. Small bilateral pleural effusions. No pneumothorax. Mild enlargement of the cardiopericardial  silhouette (unchanged). Status post median sternotomy. Stented bioprosthesis projecting over the expected location of the aortic valve. IMPRESSION: 1. Postoperative changes and support apparatus, as above. Please take note of the low position of the endotracheal tube which is at the level of the carina with tip directed toward the right mainstem bronchus. Withdrawal of the tube 5 cm for more optimal placed is strongly recommended. 2. Low lung volumes with worsening bibasilar areas of atelectasis. 3. Small bilateral pleural effusions. These results will be called to the ordering clinician or representative by the Radiologist Assistant, and communication documented in the PACS or zVision Dashboard. Electronically Signed   By: Vinnie Langton M.D.   On: 10/08/2018 09:55   Dg Chest Port 1 View  Result Date: 10/07/2018 CLINICAL DATA:  Status post AVR. EXAM: PORTABLE CHEST 1 VIEW COMPARISON:  10/07/2018. FINDINGS: Endotracheal tube, NG tube, Swan-Ganz catheter, mediastinal  drainage catheter in similar position. Prior cardiac valve replacement. Cardiomegaly. Bilateral pulmonary infiltrates and subsegmental atelectasis again noted. Slight improvement in aeration from prior exam. No prominent pleural effusion. No pneumothorax. IMPRESSION: 1.  Lines and tubes in similar position.  No pneumothorax. 2.  Prior cardiac valve replacement.  Stable cardiomegaly. 3. Bilateral pulmonary infiltrates and subsegmental atelectasis again noted. Slight improvement in aeration from prior exam. No significant pleural effusions noted on today's exam. Electronically Signed   By: Marcello Moores  Register   On: 10/07/2018 14:33   Dg Chest Port 1 View  Result Date: 10/07/2018 CLINICAL DATA:  Post AVR EXAM: PORTABLE CHEST 1 VIEW COMPARISON:  Radiograph same day 5:20 a.m. FINDINGS: Endotracheal tube tip is seen 2 cm above the level of the carina. NG tube tip is seen at just entering the stomach and the side hole seen at the GE junction. Left-sided  chest tube is seen. A right-sided pulmonary artery catheter seen in the main pulmonary artery. There are hazy airspace opacities seen bilaterally overall shallow degree of aeration. Small bilateral pleural effusions is seen. IMPRESSION: 1. ET tube in appropriate position 2. NG tube tip just entering the stomach would recommend advancing approximately 5 cm. 3. Shallow degree of aeration with bilateral pulmonary vascular congestion/edema. 4. Small bilateral pleural effusions These results will be called to the ordering clinician or representative by the Radiologist Assistant, and communication documented in the PACS or zVision Dashboard. Electronically Signed   By: Prudencio Pair M.D.   On: 10/07/2018 13:35   Dg Chest Port 1 View  Result Date: 10/07/2018 CLINICAL DATA:  Respiratory failure EXAM: PORTABLE CHEST 1 VIEW COMPARISON:  Portable exam 0528 hours compared to 10/06/2018 FINDINGS: Tip of endotracheal tube projects 2.3 cm above carina. Nasogastric tube extends into stomach. Enlargement of cardiac silhouette. Mediastinal contours normal. Improved pulmonary infiltrates. Persistent infiltrate and atelectasis at RIGHT base. No pleural effusion or pneumothorax. IMPRESSION: Improved pulmonary infiltrates. Persistent infiltrate and atelectasis RIGHT base. Electronically Signed   By: Lavonia Dana M.D.   On: 10/07/2018 08:02   Dg Chest Port 1 View  Result Date: 10/06/2018 CLINICAL DATA:  Respiratory failure EXAM: PORTABLE CHEST 1 VIEW COMPARISON:  October 05, 2018 FINDINGS: The ET tube is been pulled back and now terminates 2.9 cm above the carina, in good position. The NG tube terminates below today's film. No pneumothorax. No nodules or masses. Infiltrate in the right lung has decreased but remains, particularly in the right base. The cardiomediastinal silhouette is stable. No other changes. IMPRESSION: 1. Persistent but improving infiltrate primarily in the right base. 2. Support apparatus as above. 3. No other  changes. Electronically Signed   By: Dorise Bullion III M.D   On: 10/06/2018 08:20   Dg Abd Portable 1v  Result Date: 10/21/2018 CLINICAL DATA:  Nausea EXAM: PORTABLE ABDOMEN - 1 VIEW COMPARISON:  October 04, 2018 FINDINGS: There is enteric tube that projects over the gastric body. The bowel gas pattern is nonspecific with some mildly dilated loops of small bowel scattered throughout the abdomen. There appear to be small bilateral pleural effusions. The heart size is enlarged. There is no pneumatosis. No free air. There are few calcifications projecting over the pelvis favored to represent phleboliths. IMPRESSION: 1. Well-positioned enteric tube projecting over the gastric body. 2. Nonspecific bowel gas pattern with a few mildly dilated loops of small bowel scattered throughout the abdomen. Electronically Signed   By: Constance Holster M.D.   On: 10/21/2018 19:56   Vas US Doppler Pre  Cabg  Result Date: 10/09/2018 PREOPERATIVE VASCULAR EVALUATION  Indications:      Preop avr. Comparison Study: no prior Performing Technologist: Abram Sander RVS  Examination Guidelines: A complete evaluation includes B-mode imaging, spectral Doppler, color Doppler, and power Doppler as needed of all accessible portions of each vessel. Bilateral testing is considered an integral part of a complete examination. Limited examinations for reoccurring indications may be performed as noted.  Right Carotid Findings: +----------+--------+--------+--------+-----------+----------------------------+             PSV cm/s EDV cm/s Stenosis Describe    Comments                      +----------+--------+--------+--------+-----------+----------------------------+  CCA Prox                                          not visualized due to                                                            paitent positioning           +----------+--------+--------+--------+-----------+----------------------------+  CCA Distal 67       22                 homogeneous                               +----------+--------+--------+--------+-----------+----------------------------+  ICA Prox   97       37       1-39%    homogeneous                               +----------+--------+--------+--------+-----------+----------------------------+  ICA Distal                                        not visualized due to                                                            paitent positioning           +----------+--------+--------+--------+-----------+----------------------------+  ECA        90       14                                                          +----------+--------+--------+--------+-----------+----------------------------+ Portions of this table do not appear on this page. +----------+--------+-------+--------+------------+             PSV cm/s EDV cms Describe Arm Pressure  +----------+--------+-------+--------+------------+  Subclavian 58                                      +----------+--------+-------+--------+------------+ +---------+--------+--+--------+--+---------+  Vertebral PSV cm/s 94 EDV cm/s 25 Antegrade  +---------+--------+--+--------+--+---------+ Left Carotid Findings: +----------+--------+--------+--------+-----------+--------+             PSV cm/s EDV cm/s Stenosis Describe    Comments  +----------+--------+--------+--------+-----------+--------+  CCA Prox   74       19                homogeneous           +----------+--------+--------+--------+-----------+--------+  CCA Distal 75       26                homogeneous           +----------+--------+--------+--------+-----------+--------+  ICA Prox   96       37       1-39%    homogeneous           +----------+--------+--------+--------+-----------+--------+  ICA Distal 82       33                                      +----------+--------+--------+--------+-----------+--------+  ECA        98       15                                       +----------+--------+--------+--------+-----------+--------+ +----------+--------+--------+--------+------------+  Subclavian PSV cm/s EDV cm/s Describe Arm Pressure  +----------+--------+--------+--------+------------+             130                                      +----------+--------+--------+--------+------------+ +---------+--------+--+--------+--+---------+  Vertebral PSV cm/s 98 EDV cm/s 23 Antegrade  +---------+--------+--+--------+--+---------+  Right Doppler Findings: +----------+--------+-----+---------+------------------------------------+  Site       Pressure Index Doppler   Comments                              +----------+--------+-----+---------+------------------------------------+  Subclavian                triphasic unable to obtian due to iv placement  +----------+--------+-----+---------+------------------------------------+  Brachial                                                                  +----------+--------+-----+---------+------------------------------------+  Radial                    triphasic                                       +----------+--------+-----+---------+------------------------------------+  Ulnar                     triphasic                                       +----------+--------+-----+---------+------------------------------------+  Left  Doppler Findings: +----------+--------+-----+---------+--------+  Site       Pressure Index Doppler   Comments  +----------+--------+-----+---------+--------+  Subclavian 100            triphasic           +----------+--------+-----+---------+--------+  Radial                    triphasic           +----------+--------+-----+---------+--------+  Ulnar                     triphasic           +----------+--------+-----+---------+--------+  Summary: Right Carotid: Velocities in the right ICA are consistent with a 1-39% stenosis. Left Carotid: Velocities in the left ICA are consistent with a 1-39% stenosis. Vertebrals:  Bilateral vertebral arteries demonstrate antegrade flow. Right Upper Extremity: Doppler waveforms remain within normal limits with right radial compression. Doppler waveforms remain within normal limits with right ulnar compression. Left Upper Extremity: Doppler waveforms decrease 50% with left radial compression. Doppler waveforms remain within normal limits with left ulnar compression.  Electronically signed by Harold Barban MD on 10/09/2018 at 12:35:58 PM.    Final    [place] Ekg Site Rite  Result Date: 10/19/2018 If Site Rite image not attached, placement could not be confirmed due to current cardiac rhythm.   Labs:  CBC: Recent Labs    10/16/18 0330 10/19/18 0240 10/24/18 0406 11/01/18 0530  WBC 10.6* 10.6* 7.3 6.0  HGB 7.7* 8.3* 7.4* 7.6*  HCT 25.3* 27.0* 24.4* 25.2*  PLT 262 346 224 145*    COAGS: Recent Labs    10/01/18 1820 10/07/18 1330 11/03/18 1735  INR 1.4* 1.7* 1.3*  APTT 55* 39*  --     BMP: Recent Labs    10/26/18 0510 10/28/18 0350 10/29/18 0556 11/01/18 0530  NA 139 140 139 140  K 5.1 4.9 4.6 4.2  CL 103 104 102 102  CO2 28 27 28 27   GLUCOSE 124* 128* 129* 102*  BUN 36* 42* 41* 41*  CALCIUM 8.3* 8.4* 8.3* 8.5*  CREATININE 1.55* 1.71* 1.48* 1.65*  GFRNONAA 37* 33* 39* 34*  GFRAA 43* 38* 45* 40*    LIVER FUNCTION TESTS: Recent Labs    10/02/18 0809 10/03/18 0607 10/04/18 1145 11/01/18 0530  BILITOT 0.7 0.8 0.6 1.0  AST 77* 57* 52* 39  ALT 49* 37 24 8  ALKPHOS 65 53 72 192*  PROT 5.4* 4.9* 5.3* 5.4*  ALBUMIN 2.4* 2.0* 1.9* 1.6*    TUMOR MARKERS: No results for input(s): AFPTM, CEA, CA199, CHROMGRNA in the last 8760 hours.  Assessment and Plan: Anorexia Patient with poor PO intake since open valve repair 10/07/18 requiring nutrition support.  NGT was removed yesterday during EGD.  Gastrostomy tube requested by TCTS.  No identified source of her poor appetite despite further work-up with GI.  Case approved by Dr. Kathlene Cote.  Proceed with  placement today.   Risks and benefits discussed with the patient and aunt including, but not limited to the need for a barium enema during the procedure, bleeding, infection, peritonitis, or damage to adjacent structures.  All  questions were answered, patient is agreeable to proceed. Consent signed and in chart.  Thank you for this interesting consult.  I greatly enjoyed meeting Audrey Collier and look forward to participating in their care.  A copy of this report was sent to the requesting provider on this date.  Electronically Signed:  Docia Barrier, PA 11/04/2018, 1:53 PM   I spent a total of 40 Minutes    in face to face in clinical consultation, greater than 50% of which was counseling/coordinating care for anorexia.

## 2018-11-04 NOTE — Progress Notes (Signed)
Notified Dr. Prescott Gum concerning patient's NPO status and unable to take oral meds. CBG at 2000- 69. Will try to give crushed meds in applesauce and start patient on D51/2 @ 50. Will notify Dr. Darcey Nora if patient unable to take meds

## 2018-11-04 NOTE — Procedures (Signed)
Interventional Radiology Procedure Note  Procedure: Percutaneous gastrostomy tube placement  Complications: None  Estimated Blood Loss: < 10 mL  Findings: 20 Fr bumper retention gastrostomy placed with tip in body of stomach. OK to use in 24 hours.  Mairead Schwarzkopf T. Jaishawn Witzke, M.D Pager:  [phone]   

## 2018-11-04 NOTE — Progress Notes (Signed)
      [address]       [place] [address]             [phone]      1 Day Post-Op Procedure(s) (LRB): ESOPHAGOGASTRODUODENOSCOPY (EGD) WITH PROPOFOL (N/A) BIOPSY   Subjective:  No new complaints.  Admittedly didn't eat yesterday accept applesauce.  Participating with PT, but will still require SNF placement at discharge  Objective: Vital signs in last 24 hours: Temp:  [98.1 F (36.7 C)-98.5 F (36.9 C)] 98.4 F (36.9 C) (09/04 0755) Pulse Rate:  [68-82] 73 (09/04 0755) Cardiac Rhythm: Normal sinus rhythm (09/04 0436) Resp:  [17-31] 20 (09/04 0755) BP: (105-144)/(70-89) 125/85 (09/04 0755) SpO2:  [97 %-100 %] 100 % (09/04 0755) Weight:  [88.1 kg] 88.1 kg (09/04 0436)  Intake/Output from previous day: 09/03 0701 - 09/04 0700 In: 260 [P.O.:60; I.V.:200] Out: 680 [Urine:680]  General appearance: alert, cooperative and no distress Heart: regular rate and rhythm Lungs: clear to auscultation bilaterally Abdomen: soft, non-tender; bowel sounds normal; no masses,  no organomegaly Extremities: edema trace Wound: clean and dry  Lab Results: No results for input(s): WBC, HGB, HCT, PLT in the last 72 hours. BMET: No results for input(s): NA, K, CL, CO2, GLUCOSE, BUN, CREATININE, CALCIUM in the last 72 hours.  PT/INR:  Recent Labs    11/03/18 1735  LABPROT 16.1*  INR 1.3*   ABG    Component Value Date/Time   PHART 7.430 10/08/2018 0920   HCO3 23.6 10/08/2018 0920   TCO2 25 10/08/2018 0920   ACIDBASEDEF 6.0 (H) 10/08/2018 0307   O2SAT 94.0 10/08/2018 0920   CBG (last 3)  Recent Labs    11/04/18 0017 11/04/18 0447 11/04/18 0754  GLUCAP 95 94 92    Assessment/Plan: S/P Procedure(s) (LRB): ESOPHAGOGASTRODUODENOSCOPY (EGD) WITH PROPOFOL (N/A) BIOPSY  1. CV- NSR, BP controlled- on Lopressor, Norvasc 2. Pulmonary- no acute issues 3. Renal- creatinine has been stable, will get repeat labs in AM 4. 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 GI- no oral intake, cortrak was removed... contacted IR who will hopefully place PEG tube today... patient made NPO 6. Urinary retention- on flomax, past 2 Urine cultures have been contaminated, will attempt to send a third 7. Hypothyroidism- will increase synthroid to 150 mcg daily 8. Deconditioning- patient needs SNF placement, no bed offers 9. Dispo- patient stable, she remains stable in NSR, hopefully for feeding tube by IR, repeat urinary culture, have increased synthroid dose, will need follow up with PCP in a few weeks, continue current care   LOS: 34 days    Blannie Shedlock PA-C 11/04/2018

## 2018-11-04 NOTE — Progress Notes (Signed)
PT Cancellation Note  Patient Details Name: Audrey Collier MRN: [id number] DOB: 1960-11-22   Cancelled Treatment:    Reason Eval/Treat Not Completed: Patient at procedure or test/unavailable; patient out of room for procedure, will attempt again another day.    Reginia Naas 11/04/2018, 3:13 PM  Magda Kiel, [place] [phone] 11/04/2018

## 2018-11-04 NOTE — Progress Notes (Signed)
Patient got up to chair with steady and 1-2 assist.  Pt remained in chair for about 2 hours until she went to her procedure.  Pt got up and back to bed with steady and 1-assist.  Will continue to monitor.

## 2018-11-05 LAB — GLUCOSE, CAPILLARY
Glucose-Capillary: 102 mg/dL — ABNORMAL HIGH (ref 70–99)
Glucose-Capillary: 95 mg/dL (ref 70–99)
Glucose-Capillary: 101 mg/dL — ABNORMAL HIGH (ref 70–99)
Glucose-Capillary: 112 mg/dL — ABNORMAL HIGH (ref 70–99)
Glucose-Capillary: 128 mg/dL — ABNORMAL HIGH (ref 70–99)

## 2018-11-05 LAB — CREATININE, SERUM
Creatinine, Ser: 1.7 mg/dL — ABNORMAL HIGH (ref 0.44–1.00)
GFR calc Af Amer: 38 mL/min — ABNORMAL LOW (ref >60)
GFR calc non Af Amer: 33 mL/min — ABNORMAL LOW (ref >60)

## 2018-11-05 MED ORDER — ACETAMINOPHEN 650 MG RE SUPP
975.0000 mg | Freq: Four times a day (QID) | RECTAL | Status: DC | PRN
  Administered 2018-11-05: 975 mg via RECTAL
  Filled 2018-11-05: qty 1

## 2018-11-05 MED ORDER — OSMOLITE 1.5 CAL PO LIQD
1000.0000 mL | ORAL | Status: DC
  Administered 2018-11-05 – 2018-11-06 (×2): 1000 mL
  Filled 2018-11-05 (×2): qty 1000

## 2018-11-05 NOTE — Progress Notes (Signed)
Physical Therapy Treatment Patient Details Name: Audrey Collier MRN: [id number] DOB: 1960/09/21 Today's Date: 11/05/2018    History of Present Illness 58 yo female presented with fever, vomiting, weakness.  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  Echo showed vegetation.  Transferred to ICU 8/04 due to respiratory failure.  Underwent AVR with TEE intraoprative on 10/07/18. Extubated 10/08/18.  PEG placed 11/04/18.    PT Comments    Patient progressing with OOB after missing therapy due to PEG placed yesterday.  She was cooperative and pleasant despite some c/o pain at PEG site.  Feel she remains appropriate for SNF level rehab at d/c.  PT to follow acutely.  Goals updated this session.   Follow Up Recommendations  SNF     Equipment Recommendations  Other (comment)(TBA)    Recommendations for Other Services       Precautions / Restrictions Precautions Precautions: Fall;Sternal    Mobility  Bed Mobility Overal bed mobility: Needs Assistance Bed Mobility: Rolling;Sidelying to Sit Rolling: +2 for physical assistance;Mod assist Sidelying to sit: Mod assist;+2 for physical assistance       General bed mobility comments: rolling to protect PEG site due to pain; assist to reach for rail and to bring trunk upright  Transfers Overall transfer level: Needs assistance   Transfers: Sit to/from Stand;Stand Pivot Transfers Sit to Stand: Mod assist;+2 physical assistance;From elevated surface Stand pivot transfers: Total assist(in Stedy)       General transfer comment: assist for sit to stand pt pulling up on Stedy, not all the way upright to cues to tuck hips to lower flaps, used stedy to get to recliner, stood from stedy much easier but only stands few seconds  Ambulation/Gait                 Marine scientist Rankin (Stroke Patients Only)       Balance                                             Cognition Arousal/Alertness: Awake/alert Behavior During Therapy: Flat affect Overall Cognitive Status: History of cognitive impairments - at baseline                                        Exercises General Exercises - Lower Extremity Ankle Circles/Pumps: AROM;10 reps;Seated;Both Long Arc Quad: AROM;Both;10 reps;Seated Hip ABduction/ADduction: AROM;Both;10 reps;Seated    General Comments        Pertinent Vitals/Pain Pain Assessment: Faces Faces Pain Scale: Hurts little more Pain Location: abdomen after PEG Pain Descriptors / Indicators: Grimacing Pain Intervention(s): Monitored during session;Repositioned;Limited activity within patient's tolerance    Home Living                      Prior Function            PT Goals (current goals can now be found in the care plan section) Acute Rehab PT Goals Patient Stated Goal: none stated today PT Goal Formulation: Patient unable to participate in goal setting Time For Goal Achievement: 11/19/18 Potential to Achieve Goals: Fair Progress towards PT goals: Progressing toward goals    Frequency    Min 3X/week  PT Plan Current plan remains appropriate    Co-evaluation              AM-PAC PT "6 Clicks" Mobility   Outcome Measure  Help needed turning from your back to your side while in a flat bed without using bedrails?: A Lot Help needed moving from lying on your back to sitting on the side of a flat bed without using bedrails?: Total Help needed moving to and from a bed to a chair (including a wheelchair)?: Total Help needed standing up from a chair using your arms (e.g., wheelchair or bedside chair)?: A Lot Help needed to walk in hospital room?: Total Help needed climbing 3-5 steps with a railing? : Total 6 Click Score: 8    End of Session   Activity Tolerance: Patient tolerated treatment well Patient left: in chair;with call bell/phone within reach Nurse Communication: Mobility  status PT Visit Diagnosis: Muscle weakness (generalized) (M62.81);Other abnormalities of gait and mobility (R26.89)     Time: [id number] PT Time Calculation (min) (ACUTE ONLY): 24 min  Charges:  $Therapeutic Exercise: 8-22 mins $Therapeutic Activity: 8-22 mins                     Sheran LawlessCyndi , South CarolinaPT Acute Rehabilitation Services [phone] 11/05/2018    Elray Mcgregorynthia  11/05/2018, 5:16 PM

## 2018-11-05 NOTE — Progress Notes (Signed)
Referring Physician(s): Dr Kipp Brood  Supervising Physician: Markus Daft  Patient Status:  [facility] - In-pt  Chief Complaint:  Dysphagia   Subjective:  Percutaneous gastric tube placed in IR yesterday  Allergies: Patient has no known allergies.  Medications: Prior to Admission medications   Medication Sig Start Date End Date Taking? Authorizing Provider  Calcium Citrate-Vitamin D (CALCIUM + D PO) Take 1 tablet by mouth daily.   Yes [provider]  cetirizine (ZYRTEC) 10 MG tablet Take 10 mg by mouth daily.   Yes [provider]  diclofenac sodium (VOLTAREN) 1 % GEL Apply 1 application topically every 6 (six) hours as needed (heel pain).   Yes [provider]  docusate sodium (STOOL SOFTENER) 100 MG capsule Take 100 mg by mouth every Monday, Wednesday, and Friday.   Yes [provider]  FLUoxetine (PROZAC) 10 MG tablet Take 10 mg by mouth at bedtime.    Yes [provider]  fluticasone (FLONASE) 50 MCG/ACT nasal spray Place 1 spray into both nostrils 2 (two) times daily as needed for allergies.    Yes [provider]  levothyroxine (SYNTHROID) 125 MCG tablet Take 125 mcg by mouth daily before breakfast.    Yes [provider]  montelukast (SINGULAIR) 10 MG tablet Take 10 mg by mouth at bedtime.   Yes [provider]  Multiple Vitamin (MULTIVITAMIN) tablet Take 1 tablet by mouth daily.   Yes [provider]  omeprazole (PRILOSEC) 20 MG capsule TAKE 1 CAPSULE BY MOUTH ONCE DAILY. 07/23/15  Yes Annitta Needs, NP  rosuvastatin (CRESTOR) 5 MG tablet Take 5 mg by mouth daily.   Yes [provider]  aspirin EC 325 MG EC tablet Take 1 tablet (325 mg total) by mouth daily. 10/21/18   Nani Skillern, PA-C  ceFAZolin (ANCEF) IVPB Inject 2 g into the vein every 8 (eight) hours. Indication: MSSA endocarditis Last Day of Therapy:  11/17/18 Labs - Once weekly:  CBC/D and BMP, Labs - Every other week:   ESR and CRP 10/20/18 11/25/18  Lars Pinks M, PA-C  ferrous sulfate 325 (65 FE) MG tablet Take 1 tablet (325 mg total) by mouth daily with breakfast. For one month then stop. 10/21/18   Nani Skillern, PA-C  metolazone (ZAROXOLYN) 5 MG tablet Take 1 tablet (5 mg total) by mouth daily. 10/21/18   Nani Skillern, PA-C  metoprolol tartrate (LOPRESSOR) 25 MG tablet Take 1 tablet (25 mg total) by mouth 2 (two) times daily. 10/20/18   Nani Skillern, PA-C  potassium chloride SA (K-DUR) 20 MEQ tablet Take 1 tablet (20 mEq total) by mouth daily. 10/21/18   Nani Skillern, PA-C     Vital Signs: BP 104/62    Pulse 82    Temp 98.8 F (37.1 C) (Oral)    Resp (!) 22    Ht _0  (1.575 m)    Wt 192 lb 10.9 oz (87.4 kg)    LMP 04/16/2013 (Approximate)    SpO2 92%    BMI 35.24 kg/m   Physical Exam Vitals signs reviewed.  Abdominal:     General: Bowel sounds are normal.  Skin:    General: Skin is warm and dry.     Comments: Site is clean and dry Sl tender No bleeding No hematoma      Imaging: Nm Hepatobiliary Liver Func  Result Date: 11/01/2018 CLINICAL DATA:  Right upper quadrant pain EXAM: NUCLEAR MEDICINE HEPATOBILIARY IMAGING TECHNIQUE: Sequential images of  the abdomen were obtained out to 60 minutes following intravenous administration of radiopharmaceutical. 3 mg of morphine administered and additional images for 30 minutes obtained. RADIOPHARMACEUTICALS:  5.1 mCi Tc-50m Choletec IV COMPARISON:  Ultrasound 11/01/2018, CT 10/31/2018 FINDINGS: Prompt uptake and biliary excretion of activity by the liver is seen. Gallbladder is nonvisualized after 60 minutes of imaging. Following intravenous administration of morphine, there is visualization of the gallbladder within 30 minutes period. Biliary activity passes into small bowel, consistent with patent common bile duct. IMPRESSION: Negative for acute cholecystitis. Electronically Signed   By: KDonavan FoilM.D.   On:  11/01/2018 19:31   Ir Gastrostomy Tube Mod Sed  Result Date: 11/04/2018 CLINICAL DATA:  Malnutrition and need for percutaneous gastrostomy tube. EXAM: PERCUTANEOUS GASTROSTOMY TUBE PLACEMENT ANESTHESIA/SEDATION: 1.0 mg IV Versed; 50 mcg IV Fentanyl. Total Moderate Sedation Time 15 minutes. The patient's level of consciousness and physiologic status were continuously monitored during the procedure by Radiology nursing. CONTRAST:  128mOMNIPAQUE IOHEXOL 300 MG/ML  SOLN MEDICATIONS: 2 g IV Ancef. IV antibiotic was administered in an appropriate time interval prior to needle puncture of the skin. FLUOROSCOPY TIME:  6 minutes and 48 seconds.  38.4 mGy. PROCEDURE: The procedure, risks, benefits, and alternatives were explained to the patient's aunt. Questions regarding the procedure were encouraged and answered. The patient's aunt understands and consents to the procedure. A 5-French catheter was then advanced through the patient's mouth under fluoroscopy into the esophagus and to the level of the stomach. This catheter was used to insufflate the stomach with air under fluoroscopy. The abdominal wall was prepped with chlorhexidine in a sterile fashion, and a sterile drape was applied covering the operative field. A sterile gown and sterile gloves were used for the procedure. Local anesthesia was provided with 1% Lidocaine. A skin incision was made in the upper abdominal wall. Under fluoroscopy, an 18 gauge trocar needle was advanced into the stomach. Contrast injection was performed to confirm intraluminal position of the needle tip. A single T tack was then deployed in the lumen of the stomach. This was brought up to tension at the skin surface. Over a guidewire, a 9-French sheath was advanced into the lumen of the stomach. The wire was left in place as a safety wire. A loop snare device from a percutaneous gastrostomy kit was then advanced into the stomach. A floppy guide wire was advanced through the orogastric  catheter under fluoroscopy in the stomach. The loop snare advanced through the percutaneous gastric access was used to snare the guide wire. This allowed withdrawal of the loop snare out of the patient's mouth by retraction of the orogastric catheter and wire. A 20-French bumper retention gastrostomy tube was looped around the snare device. It was then pulled back through the patient's mouth. The retention bumper was brought up to the anterior gastric wall. The T tack suture was cut at the skin. The exiting gastrostomy tube was cut to appropriate length and a feeding adapter applied. The catheter was injected with contrast material to confirm position and a fluoroscopic spot image saved. The tube was then flushed with saline. A dressing was applied over the gastrostomy exit site. COMPLICATIONS: None. FINDINGS: The stomach distended well with air allowing safe placement of the gastrostomy tube. After placement, the tip of the gastrostomy tube lies in the body of the stomach. IMPRESSION: Percutaneous gastrostomy with placement of a 20-French bumper retention tube in the body of the stomach. This tube can be used for percutaneous feeds  beginning in 24 hours after placement. Electronically Signed   By: Aletta Edouard M.D.   On: 11/04/2018 17:04    Labs:  CBC: Recent Labs    10/16/18 0330 10/19/18 0240 10/24/18 0406 11/01/18 0530  WBC 10.6* 10.6* 7.3 6.0  HGB 7.7* 8.3* 7.4* 7.6*  HCT 25.3* 27.0* 24.4* 25.2*  PLT 262 346 224 145*    COAGS: Recent Labs    10/01/18 1820 10/07/18 1330 11/03/18 1735  INR 1.4* 1.7* 1.3*  APTT 55* 39*  --     BMP: Recent Labs    10/26/18 0510 10/28/18 0350 10/29/18 0556 11/01/18 0530 11/05/18 0504  NA 139 140 139 140  --   K 5.1 4.9 4.6 4.2  --   CL 103 104 102 102  --   CO2 _0 --   GLUCOSE 124* 128* 129* 102*  --   BUN 36* 42* 41* 41*  --   CALCIUM 8.3* 8.4* 8.3* 8.5*  --   CREATININE 1.55* 1.71* 1.48* 1.65* 1.70*  GFRNONAA 37* 33* 39* 34*  33*  GFRAA 43* 38* 45* 40* 38*    LIVER FUNCTION TESTS: Recent Labs    10/02/18 0809 10/03/18 0607 10/04/18 1145 11/01/18 0530  BILITOT 0.7 0.8 0.6 1.0  AST 77* 57* 52* 39  ALT 49* 37 24 8  ALKPHOS 65 53 72 192*  PROT 5.4* 4.9* 5.3* 5.4*  ALBUMIN 2.4* 2.0* 1.9* 1.6*    Assessment and Plan:  Perc gastric tube intact Ready for use  Electronically Signed: Lavonia Drafts, PA-C 11/05/2018, 10:26 AM   I spent a total of 15 Minutes at the the patient's bedside AND on the patient's hospital floor or unit, greater than 50% of which was counseling/coordinating care for perc gastric tube placement

## 2018-11-05 NOTE — Plan of Care (Signed)
  Problem: Health Behavior/Discharge Planning: Goal: Ability to manage health-related needs will improve 11/05/2018 1759 by Glenard Haring, RN Outcome: Progressing 11/05/2018 1056 by Glenard Haring, RN Outcome: Not Progressing   Problem: Clinical Measurements: Goal: Ability to maintain clinical measurements within normal limits will improve 11/05/2018 1759 by Glenard Haring, RN Outcome: Progressing 11/05/2018 1056 by Glenard Haring, RN Outcome: Not Progressing Goal: Will remain free from infection 11/05/2018 1759 by Glenard Haring, RN Outcome: Progressing 11/05/2018 1056 by Glenard Haring, RN Outcome: Not Progressing Goal: Diagnostic test results will improve 11/05/2018 1759 by Glenard Haring, RN Outcome: Progressing 11/05/2018 1056 by Glenard Haring, RN Outcome: Not Progressing Goal: Cardiovascular complication will be avoided 11/05/2018 1759 by Glenard Haring, RN Outcome: Progressing 11/05/2018 1056 by Glenard Haring, RN Outcome: Not Progressing   Problem: Activity: Goal: Risk for activity intolerance will decrease 11/05/2018 1759 by Glenard Haring, RN Outcome: Progressing 11/05/2018 1056 by Glenard Haring, RN Outcome: Not Progressing   Problem: Nutrition: Goal: Adequate nutrition will be maintained 11/05/2018 1759 by Glenard Haring, RN Outcome: Progressing 11/05/2018 1056 by Glenard Haring, RN Outcome: Not Progressing   Problem: Coping: Goal: Level of anxiety will decrease 11/05/2018 1759 by Glenard Haring, RN Outcome: Progressing 11/05/2018 1056 by Glenard Haring, RN Outcome: Not Progressing   Problem: Safety: Goal: Ability to remain free from injury will improve 11/05/2018 1759 by Glenard Haring, RN Outcome: Progressing 11/05/2018 1056 by Glenard Haring, RN Outcome: Not Progressing   Problem: Skin Integrity: Goal: Risk for impaired skin integrity will decrease 11/05/2018 1759 by Glenard Haring, RN Outcome: Progressing 11/05/2018 1056  by Glenard Haring, RN Outcome: Not Progressing

## 2018-11-05 NOTE — Plan of Care (Signed)
  Problem: Health Behavior/Discharge Planning: Goal: Ability to manage health-related needs will improve Outcome: Not Progressing   Problem: Clinical Measurements: Goal: Ability to maintain clinical measurements within normal limits will improve Outcome: Not Progressing Goal: Will remain free from infection Outcome: Not Progressing Goal: Diagnostic test results will improve Outcome: Not Progressing Goal: Cardiovascular complication will be avoided Outcome: Not Progressing   Problem: Activity: Goal: Risk for activity intolerance will decrease Outcome: Not Progressing   Problem: Nutrition: Goal: Adequate nutrition will be maintained Outcome: Not Progressing   Problem: Coping: Goal: Level of anxiety will decrease Outcome: Not Progressing   Problem: Safety: Goal: Ability to remain free from injury will improve Outcome: Not Progressing   Problem: Skin Integrity: Goal: Risk for impaired skin integrity will decrease Outcome: Not Progressing

## 2018-11-05 NOTE — Plan of Care (Signed)
  Problem: Clinical Measurements: Goal: Cardiovascular complication will be avoided Outcome: Progressing   Problem: Health Behavior/Discharge Planning: Goal: Ability to manage health-related needs will improve Outcome: Not Progressing   Problem: Nutrition: Goal: Adequate nutrition will be maintained Outcome: Not Progressing

## 2018-11-05 NOTE — Progress Notes (Addendum)
      [address]       [place] [address]             [phone]      2 Days Post-Op Procedure(s) (LRB): ESOPHAGOGASTRODUODENOSCOPY (EGD) WITH PROPOFOL (N/A) BIOPSY Subjective:  No new complaints.  Continues to state shes not doing too good.  Some nausea reported.  Continues to not eat, refusing medications overnight.  Objective: Vital signs in last 24 hours: Temp:  [97.7 F (36.5 C)-100.8 F (38.2 C)] 98.8 F (37.1 C) (09/05 0400) Pulse Rate:  [73-92] 92 (09/04 1958) Cardiac Rhythm: Normal sinus rhythm (09/04 2200) Resp:  [17-23] 22 (09/05 0400) BP: (104-147)/(62-97) 104/62 (09/05 0400) SpO2:  [92 %-100 %] 92 % (09/05 0400) Weight:  [87.4 kg] 87.4 kg (09/05 0631)  Intake/Output from previous day: 09/04 0701 - 09/05 0700 In: 646.6 [I.V.:446.6; IV Piggyback:200] Out: 1150 [Urine:1150]  General appearance: alert and no distress Heart: regular rate and rhythm Lungs: clear to auscultation bilaterally Abdomen: soft, non-tender; bowel sounds normal; no masses,  no organomegaly Extremities: extremities normal, atraumatic, no cyanosis or edema Wound: clean and dry  Lab Results: No results for input(s): WBC, HGB, HCT, PLT in the last 72 hours. BMET:  Recent Labs    11/05/18 0504  CREATININE 1.70*    PT/INR:  Recent Labs    11/03/18 1735  LABPROT 16.1*  INR 1.3*   ABG    Component Value Date/Time   PHART 7.430 10/08/2018 0920   HCO3 23.6 10/08/2018 0920   TCO2 25 10/08/2018 0920   ACIDBASEDEF 6.0 (H) 10/08/2018 0307   O2SAT 94.0 10/08/2018 0920   CBG (last 3)  Recent Labs    11/04/18 2241 11/04/18 2339 11/05/18 0425  GLUCAP 88 87 102*    Assessment/Plan: S/P Procedure(s) (LRB): ESOPHAGOGASTRODUODENOSCOPY (EGD) WITH PROPOFOL (N/A) BIOPSY  1. CV- NSR, continue Lopressor, Norvasc 2. Pulm- no acute issues 3. Renal- creatinine is at baseline weight is stable 4. GI- no oral intake, patient doesn't wish to eat, PEG tube placed  yesterday, can resume tube feedings this evening 5. ID- isolated low grade temp last night, likely SIRS from procedure, will monitor, continue ABX per ID recs stop date is 9/17 6. Deconditioning- patient needs SNF, no bed offers currently 7. Dispo- patient continues to make little to no progress, continues to not eat, PEG tube was placed yesterday can resume tube feedings this evening, continue PT/OT... needs SNF, CSW working on this, however no beds have been offered   LOS: 35 days    Ellwood Handler 11/05/2018 patient examined and medical record reviewed,agree with above note. Tharon Aquas Trigt III 11/05/2018

## 2018-11-05 NOTE — Plan of Care (Signed)
?  Problem: Clinical Measurements: ?Goal: Will remain free from infection ?Outcome: Progressing ?  ?

## 2018-11-06 ENCOUNTER — Encounter (HOSPITAL_COMMUNITY): Payer: Self-pay | Admitting: Gastroenterology

## 2018-11-06 LAB — GLUCOSE, CAPILLARY
Glucose-Capillary: 102 mg/dL — ABNORMAL HIGH (ref 70–99)
Glucose-Capillary: 126 mg/dL — ABNORMAL HIGH (ref 70–99)
Glucose-Capillary: 127 mg/dL — ABNORMAL HIGH (ref 70–99)
Glucose-Capillary: 132 mg/dL — ABNORMAL HIGH (ref 70–99)
Glucose-Capillary: 145 mg/dL — ABNORMAL HIGH (ref 70–99)
Glucose-Capillary: 151 mg/dL — ABNORMAL HIGH (ref 70–99)

## 2018-11-06 MED ORDER — METOPROLOL TARTRATE 25 MG/10 ML ORAL SUSPENSION
25.0000 mg | Freq: Two times a day (BID) | ORAL | Status: DC
  Administered 2018-11-06 – 2018-11-15 (×17): 25 mg
  Filled 2018-11-06 (×18): qty 10

## 2018-11-06 MED ORDER — PANTOPRAZOLE SODIUM 40 MG PO PACK
40.0000 mg | PACK | Freq: Every day | ORAL | Status: DC
  Administered 2018-11-06: 14:00:00 40 mg
  Filled 2018-11-06 (×2): qty 20

## 2018-11-06 MED ORDER — AMLODIPINE 1 MG/ML ORAL SUSPENSION: 5.0000 mg | Freq: Every day | ORAL | Status: DC

## 2018-11-06 MED ORDER — AMLODIPINE BESYLATE 5 MG PO TABS
5.0000 mg | ORAL_TABLET | Freq: Every day | ORAL | Status: DC
  Administered 2018-11-06 – 2018-11-15 (×9): 5 mg
  Filled 2018-11-06 (×10): qty 1

## 2018-11-06 MED ORDER — ADULT MULTIVITAMIN LIQUID CH
5.0000 mL | Freq: Every day | ORAL | Status: DC
  Administered 2018-11-06: 5 mL via ORAL
  Filled 2018-11-06 (×3): qty 15

## 2018-11-06 MED ORDER — TRAMADOL 5 MG/ML ORAL SUSPENSION
50.0000 mg | Freq: Four times a day (QID) | ORAL | Status: DC | PRN
  Filled 2018-11-06: qty 10

## 2018-11-06 MED ORDER — TRAMADOL 5 MG/ML ORAL SUSPENSION: 50.0000 mg | Freq: Four times a day (QID) | ORAL | Status: DC | PRN

## 2018-11-06 MED ORDER — OXYCODONE HCL 5 MG/5ML PO SOLN
5.0000 mg | ORAL | Status: DC | PRN
  Administered 2018-11-09: 11:00:00 5 mg
  Administered 2018-11-13 – 2018-11-14 (×3): 10 mg
  Administered 2018-11-26 – 2018-11-29 (×6): 5 mg
  Filled 2018-11-06: qty 5
  Filled 2018-11-06: qty 10
  Filled 2018-11-06: qty 5
  Filled 2018-11-06: qty 10
  Filled 2018-11-06: qty 5
  Filled 2018-11-06: qty 10
  Filled 2018-11-06 (×5): qty 5

## 2018-11-06 MED ORDER — FLUOXETINE HCL 20 MG/5ML PO SOLN
10.0000 mg | Freq: Every day | ORAL | Status: DC
  Administered 2018-11-06 – 2018-11-14 (×8): 10 mg
  Filled 2018-11-06 (×10): qty 5

## 2018-11-06 NOTE — Progress Notes (Signed)
Patient was given morning meds crushed in applesauce. Patient vomited 15-20 minutes afterwards. Patient given zofran and cleaned up. Will continue to monitor

## 2018-11-06 NOTE — Progress Notes (Addendum)
      [address]       [place] [address]             [phone]      3 Days Post-Op Procedure(s) (LRB): ESOPHAGOGASTRODUODENOSCOPY (EGD) WITH PROPOFOL (N/A) BIOPSY   Subjective:  No new complaints.  Continues to want to go home.  Oral intake remains poor, she continues to vomit up medications.  Worked with PT yesterday  Objective: Vital signs in last 24 hours: Temp:  [97.5 F (36.4 C)-99.1 F (37.3 C)] 97.9 F (36.6 C) (09/06 0400) Pulse Rate:  [71-85] 78 (09/05 1616) Cardiac Rhythm: Normal sinus rhythm (09/05 1900) Resp:  [16-22] 22 (09/06 0600) BP: (103-126)/(65-76) 126/73 (09/06 0400) SpO2:  [96 %-100 %] 99 % (09/06 0600) Weight:  [89.2 kg] 89.2 kg (09/06 0500)  Intake/Output from previous day: 09/05 0701 - 09/06 0700 In: 4858.8 [P.O.:580; I.V.:1232; NG/GT:1006.8; IV Piggyback:2040] Out: 850 [Urine:850]  General appearance: alert, cooperative, no distress and +depressed Heart: regular rate and rhythm Lungs: clear to auscultation bilaterally Abdomen: soft, non-tender; bowel sounds normal; no masses,  no organomegaly Extremities: edema trace Wound: clean and dry  Lab Results: No results for input(s): WBC, HGB, HCT, PLT in the last 72 hours. BMET:  Recent Labs    11/05/18 0504  CREATININE 1.70*    PT/INR:  Recent Labs    11/03/18 1735  LABPROT 16.1*  INR 1.3*   ABG    Component Value Date/Time   PHART 7.430 10/08/2018 0920   HCO3 23.6 10/08/2018 0920   TCO2 25 10/08/2018 0920   ACIDBASEDEF 6.0 (H) 10/08/2018 0307   O2SAT 94.0 10/08/2018 0920   CBG (last 3)  Recent Labs    11/05/18 2331 11/06/18 0410 11/06/18 0817  GLUCAP 145* 151* 132*    Assessment/Plan: S/P Procedure(s) (LRB): ESOPHAGOGASTRODUODENOSCOPY (EGD) WITH PROPOFOL (N/A) BIOPSY  1. CV- NSR- continue Lopressor, Norvasc 2. Pulm- no acute issues, continue IS 3. Renal- weight remains elevated, creatinine is at baseline 4. GU- will attempt to get repeat Urine  culture, remains on flomax, foley remains in place.. may ultimately need to clamp foley to see if patient is able to feel like she has to void prior to attempting removal 5. GI- oral intake remains poor, she continues to vomit up medications, will transition medications to liquid for placement down PEG tube as able, continue to follow dietary recommendations 6. ID-reamins afebrile, on ABX per ID, stop date is 9/17 7. Deconditioning- continue PT/OT... for SNF if bed becomes available, patient is not able to be discharged home 8. Dispo- patient stable, she continues to have issues with poor oral intake which is multifactorial, continue tube feedings, increase oral intake as able, depression is present as well on home Prozac, continue PT/OT and work on SNF placement which has proven to be difficult thus far   LOS: 36 days    Erin Barrett 11/06/2018  Sternal incision clean, dry Nsr, no murmur  patient examined and medical record reviewed,agree with above note. Tharon Aquas Trigt III 11/06/2018

## 2018-11-06 NOTE — Plan of Care (Signed)
  Problem: Health Behavior/Discharge Planning: Goal: Ability to manage health-related needs will improve Outcome: Progressing   Problem: Clinical Measurements: Goal: Ability to maintain clinical measurements within normal limits will improve Outcome: Progressing Goal: Will remain free from infection Outcome: Progressing Goal: Diagnostic test results will improve Outcome: Progressing Goal: Cardiovascular complication will be avoided Outcome: Progressing   Problem: Activity: Goal: Risk for activity intolerance will decrease Outcome: Progressing   Problem: Nutrition: Goal: Adequate nutrition will be maintained Outcome: Progressing   Problem: Coping: Goal: Level of anxiety will decrease Outcome: Progressing   Problem: Safety: Goal: Ability to remain free from injury will improve Outcome: Progressing   Problem: Skin Integrity: Goal: Risk for impaired skin integrity will decrease Outcome: Progressing

## 2018-11-07 ENCOUNTER — Inpatient Hospital Stay (HOSPITAL_COMMUNITY): Payer: Medicaid Other

## 2018-11-07 LAB — CBC
HCT: 22.2 % — ABNORMAL LOW (ref 36.0–46.0)
Hemoglobin: 6.8 g/dL — CL (ref 12.0–15.0)
MCH: 29.8 pg (ref 26.0–34.0)
MCHC: 30.6 g/dL (ref 30.0–36.0)
MCV: 97.4 fL (ref 80.0–100.0)
Platelets: 90 K/uL — ABNORMAL LOW (ref 150–400)
RBC: 2.28 MIL/uL — ABNORMAL LOW (ref 3.87–5.11)
RDW: 17.6 % — ABNORMAL HIGH (ref 11.5–15.5)
WBC: 6.8 K/uL (ref 4.0–10.5)
nRBC: 0 % (ref 0.0–0.2)

## 2018-11-07 LAB — BASIC METABOLIC PANEL WITH GFR
Anion gap: 10 (ref 5–15)
BUN: 44 mg/dL — ABNORMAL HIGH (ref 6–20)
CO2: 24 mmol/L (ref 22–32)
Calcium: 8 mg/dL — ABNORMAL LOW (ref 8.9–10.3)
Chloride: 102 mmol/L (ref 98–111)
Creatinine, Ser: 2.01 mg/dL — ABNORMAL HIGH (ref 0.44–1.00)
GFR calc Af Amer: 31 mL/min — ABNORMAL LOW
GFR calc non Af Amer: 27 mL/min — ABNORMAL LOW
Glucose, Bld: 144 mg/dL — ABNORMAL HIGH (ref 70–99)
Potassium: 3.6 mmol/L (ref 3.5–5.1)
Sodium: 136 mmol/L (ref 135–145)

## 2018-11-07 LAB — HEPATIC FUNCTION PANEL
ALT: <5 U/L (ref 0–44)
AST: 22 U/L (ref 15–41)
Albumin: 1.3 g/dL — ABNORMAL LOW (ref 3.5–5.0)
Alkaline Phosphatase: 154 U/L — ABNORMAL HIGH (ref 38–126)
Bilirubin, Direct: 0.2 mg/dL (ref 0.0–0.2)
Indirect Bilirubin: 0.3 mg/dL (ref 0.3–0.9)
Total Bilirubin: 0.5 mg/dL (ref 0.3–1.2)
Total Protein: 4.5 g/dL — ABNORMAL LOW (ref 6.5–8.1)

## 2018-11-07 LAB — GLUCOSE, CAPILLARY
Glucose-Capillary: 155 mg/dL — ABNORMAL HIGH (ref 70–99)
Glucose-Capillary: 131 mg/dL — ABNORMAL HIGH (ref 70–99)
Glucose-Capillary: 111 mg/dL — ABNORMAL HIGH (ref 70–99)
Glucose-Capillary: 94 mg/dL (ref 70–99)

## 2018-11-07 LAB — URINE CULTURE

## 2018-11-07 LAB — PREPARE RBC (CROSSMATCH)

## 2018-11-07 MED ORDER — VANCOMYCIN HCL IN DEXTROSE 1-5 GM/200ML-% IV SOLN
1000.0000 mg | INTRAVENOUS | Status: DC
  Administered 2018-11-09: 18:00:00 1000 mg via INTRAVENOUS
  Filled 2018-11-07: qty 200

## 2018-11-07 MED ORDER — PANTOPRAZOLE SODIUM 40 MG IV SOLR: 40.0000 mg | INTRAVENOUS | Status: DC

## 2018-11-07 MED ORDER — PIPERACILLIN-TAZOBACTAM 3.375 G IVPB
3.3750 g | Freq: Three times a day (TID) | INTRAVENOUS | Status: DC
  Administered 2018-11-07 – 2018-11-10 (×10): 3.375 g via INTRAVENOUS
  Filled 2018-11-07 (×10): qty 50

## 2018-11-07 MED ORDER — METOCLOPRAMIDE HCL 5 MG/ML IJ SOLN
10.0000 mg | Freq: Four times a day (QID) | INTRAMUSCULAR | Status: DC
  Administered 2018-11-07 – 2018-11-11 (×18): 10 mg via INTRAVENOUS
  Filled 2018-11-07 (×17): qty 2

## 2018-11-07 MED ORDER — SODIUM CHLORIDE 0.9% IV SOLUTION: Freq: Once | INTRAVENOUS | Status: DC

## 2018-11-07 MED ORDER — VANCOMYCIN HCL 10 G IV SOLR
2000.0000 mg | Freq: Once | INTRAVENOUS | Status: AC
  Administered 2018-11-07: 2000 mg via INTRAVENOUS
  Filled 2018-11-07: qty 2000

## 2018-11-07 MED ORDER — DEXTROSE-NACL 5-0.45 % IV SOLN: INTRAVENOUS | Status: DC

## 2018-11-07 MED ORDER — METOLAZONE 5 MG PO TABS: 5.0000 mg | ORAL_TABLET | Freq: Once | ORAL | Status: DC

## 2018-11-07 MED ORDER — LACTULOSE 10 GM/15ML PO SOLN
20.0000 g | Freq: Every day | ORAL | Status: DC | PRN
  Administered 2018-11-20: 11:00:00 20 g
  Filled 2018-11-07 (×2): qty 30

## 2018-11-07 MED ORDER — PANTOPRAZOLE SODIUM 40 MG IV SOLR
40.0000 mg | INTRAVENOUS | Status: DC
  Administered 2018-11-07 – 2018-11-11 (×5): 40 mg via INTRAVENOUS
  Filled 2018-11-07 (×5): qty 40

## 2018-11-07 MED ORDER — RIFAMPIN 300 MG PO CAPS
300.0000 mg | ORAL_CAPSULE | Freq: Three times a day (TID) | ORAL | Status: DC
  Administered 2018-11-08 – 2018-11-12 (×12): 300 mg via ORAL
  Filled 2018-11-07 (×18): qty 1

## 2018-11-07 MED ORDER — MAGIC MOUTHWASH
5.0000 mL | Freq: Three times a day (TID) | ORAL | Status: DC | PRN
  Filled 2018-11-07: qty 5

## 2018-11-07 NOTE — Progress Notes (Signed)
[name], patients caretaker and sister would like Dr Kipp Brood to call her tomorrow with a update on all the tests done.

## 2018-11-07 NOTE — Progress Notes (Signed)
CT Surgery PM Rounds  One unit of packed cells infused [ difficult to setup dur to antibodies] CT abdomen report is negative [place] of RUQ shows single 2 cm gall stone without GB wall inflammation  Patient remains afebrile with normal BP leve G tube to straight drain tonite check KUB in am and resuime TF later if no change  Cont empiric antibiotics until cultures return

## 2018-11-07 NOTE — Progress Notes (Signed)
Patient called out stating that, "she needs to be cleaned up". Patient had vomited (projectile?) all over her gown, down her back and bed. All meds were given thru tube. 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 Patient had eaten earlier for day shift nurse. (egg salad sandwich, pudding and ginger ale). Patient did not eat all of this but did eat part of it.   Bowel sounds were assessed. Barely audible, possibly some tinkling noises?? No BM since 9/3.   Abdomen taut and distended. Patient very edematous. At least +3 edema

## 2018-11-07 NOTE — Progress Notes (Signed)
Transferred down to 2 H from floor with belongings. Patient slightly communicative following directions. Sleepy. C/o abdominal discomfort. PEG to drainage gravity, small amount. orangish drainage. Dr Lucianne Lei tright into see. Prep ist dose given for CT at 115 PM. o2 taken off, sat decreased to 88% placed back on at 2L awaiting blood, has antibiodies, will take a while to crossmatch per lab.

## 2018-11-07 NOTE — Progress Notes (Signed)
Patient took her synthroid in applesauce with no problem. When attempting to give patient her rifampin, she shook her and said no. Asked her if this is what was making her sick on her stomach, she said "yes". Rifampin was charted "not given". Will continue to monitor

## 2018-11-07 NOTE — Progress Notes (Signed)
Peg tube placed to gravity drainage per order. Scant amount of orange liquid returned.  Clyde Canterbury, RN

## 2018-11-07 NOTE — Progress Notes (Signed)
4 Days Post-Op Procedure(s) (LRB): ESOPHAGOGASTRODUODENOSCOPY (EGD) WITH PROPOFOL (N/A) BIOPSY Subjective: Abdominal pain and distention, significant drop in hemoglobin following placement of PEG tube KUB is nonspecific PEG tube placed to straight drain Abdomen diffusely tender but without guarding, bowel sounds are diminished  Plan CT of abdomen with oral contrast Transfusion 1 blood bank can process type O blood due to regional shortage Patient moved to ICU for closer observation Pancultures submitted, will start broad-spectrum antibiotic Objective: Vital signs in last 24 hours: Temp:  [97.6 F (36.4 C)-98.2 F (36.8 C)] 98 F (36.7 C) (09/07 1035) Pulse Rate:  [75-82] 75 (09/07 0814) Cardiac Rhythm: Normal sinus rhythm (09/07 1030) Resp:  [18-23] 21 (09/07 1000) BP: (105-123)/(55-81) 113/70 (09/07 1000) SpO2:  [91 %-100 %] 96 % (09/07 1000) Weight:  [91.2 kg] 91.2 kg (09/07 0500)  Hemodynamic parameters for last 24 hours:    Intake/Output from previous day: 09/06 0701 - 09/07 0700 In: 2778 [P.O.:220; I.V.:805.7; NG/GT:363.3; IV Piggyback:300] Out: 825 [Urine:825] Intake/Output this shift: Total I/O In: -  Out: 220 [Urine:220]  Sinus rhythm with normal prosthetic AVR sound  Lab Results: Recent Labs    11/07/18 0112  WBC 6.8  HGB 6.8*  HCT 22.2*  PLT 90*   BMET:  Recent Labs    11/05/18 0504 11/07/18 0112  NA  --  136  K  --  3.6  CL  --  102  CO2  --  24  GLUCOSE  --  144*  BUN  --  44*  CREATININE 1.70* 2.01*  CALCIUM  --  8.0*    PT/INR: No results for input(s): LABPROT, INR in the last 72 hours. ABG    Component Value Date/Time   PHART 7.430 10/08/2018 0920   HCO3 23.6 10/08/2018 0920   TCO2 25 10/08/2018 0920   ACIDBASEDEF 6.0 (H) 10/08/2018 0307   O2SAT 94.0 10/08/2018 0920   CBG (last 3)  Recent Labs    11/06/18 2349 11/07/18 0412 11/07/18 0814  GLUCAP 126* 155* 131*    Assessment/Plan: S/P Procedure(s)  (LRB): ESOPHAGOGASTRODUODENOSCOPY (EGD) WITH PROPOFOL (N/A) BIOPSY Transfuse, hold Lovenox Hold tube feeds through PEG, CT scan of abdomen to assess for bowel injury versus ileus Ultrasound gallbladder to assess for cholecystitis   LOS: 37 days    Tharon Aquas Trigt III 11/07/2018

## 2018-11-07 NOTE — Progress Notes (Signed)
4 Days Post-Op Procedure(s) (LRB): ESOPHAGOGASTRODUODENOSCOPY (EGD) WITH PROPOFOL (N/A) BIOPSY Subjective: CT abdomen looks ok but final report pending Lower chest on CT Abd shows prob RLL density- will add vanco to Zosyn for possible pneumonia Leave PEG tube to drain Objective: Vital signs in last 24 hours: Temp:  [97.6 F (36.4 C)-98.2 F (36.8 C)] 98 F (36.7 C) (09/07 1155) Pulse Rate:  [73-83] 73 (09/07 1300) Cardiac Rhythm: Normal sinus rhythm (09/07 1030) Resp:  [18-24] 20 (09/07 1300) BP: (105-129)/(55-81) 128/65 (09/07 1300) SpO2:  [89 %-100 %] 100 % (09/07 1300) Weight:  [91.2 kg] 91.2 kg (09/07 0500)  Hemodynamic parameters for last 24 hours:    Intake/Output from previous day: 09/06 0701 - 09/07 0700 In: 1764 [P.O.:220; I.V.:805.7; NG/GT:363.3; IV Piggyback:300] Out: 825 [Urine:825] Intake/Output this shift: Total I/O In: 720 [Other:720] Out: 520 [Urine:520]    Lab Results: Recent Labs    11/07/18 0112  WBC 6.8  HGB 6.8*  HCT 22.2*  PLT 90*   BMET:  Recent Labs    11/05/18 0504 11/07/18 0112  NA  --  136  K  --  3.6  CL  --  102  CO2  --  24  GLUCOSE  --  144*  BUN  --  44*  CREATININE 1.70* 2.01*  CALCIUM  --  8.0*    PT/INR: No results for input(s): LABPROT, INR in the last 72 hours. ABG    Component Value Date/Time   PHART 7.430 10/08/2018 0920   HCO3 23.6 10/08/2018 0920   TCO2 25 10/08/2018 0920   ACIDBASEDEF 6.0 (H) 10/08/2018 0307   O2SAT 94.0 10/08/2018 0920   CBG (last 3)  Recent Labs    11/07/18 0412 11/07/18 0814 11/07/18 1215  GLUCAP 155* 131* 111*    Assessment/Plan: S/P Procedure(s) (LRB): ESOPHAGOGASTRODUODENOSCOPY (EGD) WITH PROPOFOL (N/A) BIOPSY possible   LOS: 37 days  Keep NPO with PEG to drain transfuse for Hb 6 when blood is available Treat fdor RLL pneumonia  Tharon Aquas Trigt III 11/07/2018

## 2018-11-07 NOTE — Progress Notes (Signed)
Blood first unit going in , patient  With some nausea, heaving, but no emisis, feels like reflux. Right arm IV checked frequently , as blood running in that IV. Swollen normally, no new swelling or bruising.

## 2018-11-07 NOTE — Progress Notes (Signed)
Patient's tongue looks crusted, blackish on one side. Orange in appearance d/t rifadin. Patient states that it is painful.

## 2018-11-07 NOTE — Progress Notes (Signed)
OT Cancellation Note  Patient Details Name: Audrey Collier MRN: [id number] DOB: Nov 11, 1960   Cancelled Treatment:    Reason Eval/Treat Not Completed: Medical issues which prohibited therapy.  Pt with drop in Hbg - 6.8 and vomiting.  Moved to [place].  Will reattempt.  Lucille Passy, OTR/L [place] Pager [phone] Office [phone]   Lucille Passy M 11/07/2018, 10:27 AM

## 2018-11-07 NOTE — Progress Notes (Signed)
      [address]       [place] [address]             [phone]      4 Days Post  Procedure(s) (LRB): ESOPHAGOGASTRODUODENOSCOPY (EGD) WITH PROPOFOL (N/A) BIOPSY   Subjective:  Per nursing patient increased vomiting overnight/this morning..  Patient doesn't feel well this morning.  She states she is nauseated and her stomach is hurting.  She has attempted to eat but ultimately vomited it back up.  Objective: Vital signs in last 24 hours: Temp:  [97.6 F (36.4 C)-98.1 F (36.7 C)] 98 F (36.7 C) (09/07 0417) Pulse Rate:  [79-82] 82 (09/06 1627) Cardiac Rhythm: Normal sinus rhythm (09/06 2200) Resp:  [18-20] 18 (09/07 0417) BP: (101-123)/(64-81) 123/71 (09/07 0417) SpO2:  [98 %-100 %] 100 % (09/07 0417) Weight:  [91.2 kg] 91.2 kg (09/07 0500)  Intake/Output from previous day: 09/06 0701 - 09/07 0700 In: 1764 [P.O.:220; I.V.:805.7; NG/GT:363.3; IV Piggyback:300] Out: 825 [Urine:825]  General appearance: alert and mild distress Heart: regular rate and rhythm Lungs: diminished breath sounds bibasilar Abdomen: + abdominal distention, tender to palpation in all 4 quadrants, hypoactive BS Wound: clean and dry  Lab Results: Recent Labs    11/07/18 0112  WBC 6.8  HGB 6.8*  HCT 22.2*  PLT 90*   BMET:  Recent Labs    11/05/18 0504 11/07/18 0112  NA  --  136  K  --  3.6  CL  --  102  CO2  --  24  GLUCOSE  --  144*  BUN  --  44*  CREATININE 1.70* 2.01*  CALCIUM  --  8.0*    PT/INR: No results for input(s): LABPROT, INR in the last 72 hours. ABG    Component Value Date/Time   PHART 7.430 10/08/2018 0920   HCO3 23.6 10/08/2018 0920   TCO2 25 10/08/2018 0920   ACIDBASEDEF 6.0 (H) 10/08/2018 0307   O2SAT 94.0 10/08/2018 0920   CBG (last 3)  Recent Labs    11/06/18 2022 11/06/18 2349 11/07/18 0412  GLUCAP 102* 126* 155*    Assessment/Plan: S/P Procedure(s) (LRB): ESOPHAGOGASTRODUODENOSCOPY (EGD) WITH PROPOFOL (N/A) BIOPSY  1.  CV- NSR, on Lopressor, Norvasc 2. Pulm- no acute issues, will repeat CXR in AM to assess previously seen pleural effusions 3. Renal- creatinine up to 2.01 not on diuretics, K is at 3.6.Marland KitchenMarland Kitchenwill start some IV fluids as with patient's poor oral intake, she is likely dry, repeat BMET in AM 4. GU-urinary retention foley is in place, on flomax, repeat UC has been sent, remains pending 5. Anemia- drop in hemoglobin to 6.8 this morning, patient with increase in vomiting overnight, hasn't moved bowls since 9/3... will check occult stool, stat ABD film, concern for possible Ileus vs early GI Bleed 6. Thrombocytopenia- Plt count has dropped to 95 from 145, will stop ASA, ? Source 7. GI- increased N/V, will check LFTs, continue anti-emetics 8. ID- remains afebrile, no leukocytosis, refused oral Rifampin, will transition to oral liquid as patient needs for Endocarditis treatment 9. Dispo- patient with elevated creatinine this morning, drop in Hemoglobin, UC remains pending, increased vomiting.[name] get stat ABD film, check occult blood for stool, start IV fluids, will discuss further care with Dr. Prescott Gum   LOS: 56 days    Ellwood Handler 11/07/2018

## 2018-11-07 NOTE — Plan of Care (Signed)
?  Problem: Clinical Measurements: ?Goal: Will remain free from infection ?Outcome: Progressing ?  ?

## 2018-11-07 NOTE — Progress Notes (Signed)
Pt transferred to [place] with all belongings. Receiving RN at bedside. Family updated on pt new room.   Clyde Canterbury, RN

## 2018-11-07 NOTE — Progress Notes (Signed)
Pharmacy Antibiotic Note  Audrey Collier is a 58 y.o. female admitted on 10/01/2018 with sepsis secondary to pneumonia. 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 Now with chest CT concerning for RLL pneumonia. Pharmacy has been consulted for vancomycin dosing.  Plan: Stop cefazolin Vancomycin 2000 mg x 1, then 1000 mg IV Q 48 hrs. Goal AUC 400-550. Expected AUC: 444 SCr used: 2.01  Height: 5\' 2"  (157.5 cm) Weight: 201 lb 1 oz (91.2 kg) IBW/kg (Calculated) : 50.1  Temp (24hrs), Avg:97.9 F (36.6 C), Min:97.6 F (36.4 C), Max:98.2 F (36.8 C)  Recent Labs  Lab 11/01/18 0530 11/05/18 0504 11/07/18 0112  WBC 6.0  --  6.8  CREATININE 1.65* 1.70* 2.01*    Estimated Creatinine Clearance: 32.4 mL/min (A) (by C-G formula based on SCr of 2.01 mg/dL (H)).    No Known Allergies  Antimicrobials this admission: Rocephin 8/1 x 1 Azithromycin 8/1 x 1  Cefazolin 8/2 >> 8/5, 8/10>>9/6 - would have completed 9/17 Nafcillin 8/5 >> 8/10 Rifampin 8/7 >> Vanc 9/7 >>  Microbiology results: 8/1 bxc: MSSA 2/2 8/1 UCx - ngF 8/1, 8/2 covid - neg 8/2 bcx x 1: ngF 8/2 MRSA: neg 8/5 bcx: ngF 8/5 TA: normal flora 8/7 valve cx: neg 8/30 UCx - multiple species, suggest recollection 9/2 UCx - multiple species, suggest recollection 9/7 BCx -  9/7 UCx -   Thank you for allowing pharmacy to be a part of this patient's care.  Vertis Kelch, PharmD PGY2 Cardiology Pharmacy Resident Phone [phone] 11/07/2018       3:49 PM  Please check [email or web address] for unit-specific pharmacist phone numbers

## 2018-11-07 NOTE — Progress Notes (Signed)
Notified by lab with critical Hgb 6.8. Per previous order, "do not call MD unless Hgb <6" Patient is asymptomatic and vital signs are stable. Will continue to monitor.

## 2018-11-07 NOTE — Plan of Care (Signed)
Patient having GI issues anemia requiring transfusions. Has antibodies, blood took most of day to get crossmatched.  CT scan done  and ultrasound in progress  Worry about pneumonia vs fluid-  on antibiotics, awaiting urine culture, still with foley.  Plan nsg dimpaired airway exchange related t decreased oxygen carrying capacity anemia   Potential for aspiration and worsening airway exchange related to abdomnial distention  Plan: replenish RBC's with transfusion 2 units PRBC  Gtube to gravity drain NPO for now Abdominal ultrasound CT scan done Mouth care, timely antibiotics, turn cough deep breath comforting interventions

## 2018-11-08 ENCOUNTER — Ambulatory Visit: Payer: Medicaid Other | Admitting: Physician Assistant

## 2018-11-08 LAB — BPAM RBC
Blood Product Expiration Date: 202009222359
Blood Product Expiration Date: 202009232359
ISSUE DATE / TIME: 202009071707
ISSUE DATE / TIME: 202009072013
Unit Type and Rh: 9500
Unit Type and Rh: 9500

## 2018-11-08 LAB — TYPE AND SCREEN
ABO/RH(D): O POS
Antibody Screen: POSITIVE
DAT, IgG: NEGATIVE
Donor AG Type: NEGATIVE
Donor AG Type: NEGATIVE
Unit division: 0
Unit division: 0

## 2018-11-08 LAB — BASIC METABOLIC PANEL
Anion gap: 10 (ref 5–15)
BUN: 45 mg/dL — ABNORMAL HIGH (ref 6–20)
CO2: 25 mmol/L (ref 22–32)
Calcium: 8.1 mg/dL — ABNORMAL LOW (ref 8.9–10.3)
Chloride: 102 mmol/L (ref 98–111)
Creatinine, Ser: 2.26 mg/dL — ABNORMAL HIGH (ref 0.44–1.00)
GFR calc Af Amer: 27 mL/min — ABNORMAL LOW (ref >60)
GFR calc non Af Amer: 23 mL/min — ABNORMAL LOW (ref >60)
Glucose, Bld: 100 mg/dL — ABNORMAL HIGH (ref 70–99)
Potassium: 3.8 mmol/L (ref 3.5–5.1)
Sodium: 137 mmol/L (ref 135–145)

## 2018-11-08 LAB — CBC
HCT: 27.4 % — ABNORMAL LOW (ref 36.0–46.0)
Hemoglobin: 8.8 g/dL — ABNORMAL LOW (ref 12.0–15.0)
MCH: 30 pg (ref 26.0–34.0)
MCHC: 32.1 g/dL (ref 30.0–36.0)
MCV: 93.5 fL (ref 80.0–100.0)
Platelets: 93 10*3/uL — ABNORMAL LOW (ref 150–400)
RBC: 2.93 MIL/uL — ABNORMAL LOW (ref 3.87–5.11)
RDW: 17.5 % — ABNORMAL HIGH (ref 11.5–15.5)
WBC: 7 10*3/uL (ref 4.0–10.5)
nRBC: 0 % (ref 0.0–0.2)

## 2018-11-08 LAB — GLUCOSE, CAPILLARY
Glucose-Capillary: 91 mg/dL (ref 70–99)
Glucose-Capillary: 89 mg/dL (ref 70–99)
Glucose-Capillary: 100 mg/dL — ABNORMAL HIGH (ref 70–99)
Glucose-Capillary: 106 mg/dL — ABNORMAL HIGH (ref 70–99)
Glucose-Capillary: 84 mg/dL (ref 70–99)

## 2018-11-08 MED ORDER — FLEET ENEMA 7-19 GM/118ML RE ENEM
1.0000 | ENEMA | Freq: Once | RECTAL | Status: AC
  Administered 2018-11-08: 21:00:00 1 via RECTAL
  Filled 2018-11-08: qty 1

## 2018-11-08 MED ORDER — OSMOLITE 1.5 CAL PO LIQD
1000.0000 mL | ORAL | Status: DC
  Administered 2018-11-08 – 2018-11-12 (×5): 1000 mL
  Filled 2018-11-08 (×12): qty 1000

## 2018-11-08 MED ORDER — BOOST / RESOURCE BREEZE PO LIQD CUSTOM
1.0000 | Freq: Three times a day (TID) | ORAL | Status: DC
  Administered 2018-11-09 – 2018-11-23 (×20): 1 via ORAL

## 2018-11-08 MED ORDER — PRO-STAT SUGAR FREE PO LIQD
30.0000 mL | Freq: Three times a day (TID) | ORAL | Status: DC
  Administered 2018-11-08 – 2018-11-14 (×18): 30 mL
  Filled 2018-11-08 (×18): qty 30

## 2018-11-08 MED ORDER — ADULT MULTIVITAMIN LIQUID CH
15.0000 mL | Freq: Every day | ORAL | Status: DC
  Administered 2018-11-08 – 2018-11-23 (×13): 15 mL via ORAL
  Filled 2018-11-08 (×18): qty 15

## 2018-11-08 MED ORDER — POTASSIUM CHLORIDE 20 MEQ/15ML (10%) PO SOLN
20.0000 meq | Freq: Once | ORAL | Status: AC
  Administered 2018-11-08: 10:00:00 20 meq
  Filled 2018-11-08: qty 15

## 2018-11-08 NOTE — Progress Notes (Signed)
Occupational Therapy Treatment Patient Details Name: Audrey SevereSheila D Collier MRN: [id number] DOB: 22-Mar-1960 Today's Date: 11/08/2018    History of present illness 58 yo female presented with fever, vomiting, weakness.  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  Echo showed vegetation.  Transferred to ICU 8/04 due to respiratory failure.  Underwent AVR with TEE intraoprative on 10/07/18. Extubated 10/08/18.  PEG placed 11/04/18.   OT comments  Patient supine in bed and agreeable to OOB to chair with encouragement.  Continues to required mod assist +2 for bed mobility, mod assist +2 for transfers using stedy given cueing for hand placement and technique.  Noted increased edema in L UE, improved in R UE; RN reports having to feed her today due to limited functional ROM and strength.  Completed UE exercises supine, continues to require AAROM for full ROM and cueing to initiate, attend and complete exercise.  Poor carryover of exercises (hand pumps) independently, continued education on importance.  Elevated UEs upon exit, pt seated in chair.  Will follow acutely. Dc plan remains appropriate.    Follow Up Recommendations  SNF;Supervision/Assistance - 24 hour    Equipment Recommendations  3 in 1 bedside commode    Recommendations for Other Services      Precautions / Restrictions Precautions Precautions: Fall;Sternal Restrictions Weight Bearing Restrictions: Yes Other Position/Activity Restrictions: sternal precautions        Mobility Bed Mobility Overal bed mobility: Needs Assistance       Supine to sit: Mod assist;+2 for physical assistance;+2 for safety/equipment;HOB elevated     General bed mobility comments: pt initating movement of LEs towards EOB, but requires mod assist to fully complete motion, assist trunk upright and scoot hips towards EOB using pads   Transfers Overall transfer level: Needs assistance   Transfers: Sit to/from Stand;Stand Pivot Transfers Sit to Stand: Mod  assist;+2 physical assistance;From elevated surface Stand pivot transfers: Total assist(in stedy)       General transfer comment: requires assist to power up into standing with BUE supported on stedy     Balance Overall balance assessment: Needs assistance Sitting-balance support: No upper extremity supported;Feet supported Sitting balance-Leahy Scale: Fair Sitting balance - Comments: min-min guard for dynamic EOB, R lateral and posterior lean   Standing balance support: Bilateral upper extremity supported;During functional activity Standing balance-Leahy Scale: Poor Standing balance comment: Aof 2 for standing for safety                           ADL either performed or assessed with clinical judgement   ADL Overall ADL's : Needs assistance/impaired                         Toilet Transfer: Moderate assistance;+2 for physical assistance Toilet Transfer Details (indicate cue type and reason): sit to stand, using stedy to transfer to recliner          Functional mobility during ADLs: Moderate assistance;+2 for physical assistance;+2 for safety/equipment       Vision       Perception     Praxis      Cognition Arousal/Alertness: Awake/alert Behavior During Therapy: Flat affect Overall Cognitive Status: History of cognitive impairments - at baseline Area of Impairment: Attention;Following commands;Safety/judgement;Problem solving;Awareness                   Current Attention Level: Selective   Following Commands: Follows one step commands inconsistently;Follows one step commands  with increased time Safety/Judgement: Decreased awareness of safety;Decreased awareness of deficits Awareness: Intellectual Problem Solving: Slow processing;Requires verbal cues;Requires tactile cues;Decreased initiation;Difficulty sequencing General Comments: continues to require increased time for processing and initiation         Exercises Exercises: General  Upper Extremity;Other exercises General Exercises - Upper Extremity Shoulder Flexion: AAROM;Both;10 reps;Supine Shoulder Extension: AAROM;Both;10 reps;Supine Elbow Flexion: AAROM;Both;10 reps;Supine Elbow Extension: AAROM;Both;10 reps;Supine Wrist Flexion: AAROM;Left;10 reps;Supine Wrist Extension: AAROM;Left;10 reps;Supine Digit Composite Flexion: AROM;Left;10 reps;Supine Composite Extension: AROM;Left;10 reps;Supine Other Exercises Other Exercises: dynamic reaching at EOB foward x 5 reps each UE to target, AAROM required with L UE due to edema/weakness    Shoulder Instructions       General Comments      Pertinent Vitals/ Pain       Pain Assessment: Faces Faces Pain Scale: Hurts little more Pain Location: abdomen Pain Descriptors / Indicators: Grimacing;Discomfort Pain Intervention(s): Monitored during session  Home Living                                          Prior Functioning/Environment              Frequency  Min 2X/week        Progress Toward Goals  OT Goals(current goals can now be found in the care plan section)  Progress towards OT goals: Progressing toward goals  Acute Rehab OT Goals Patient Stated Goal: none stated today  Plan Discharge plan remains appropriate;Frequency remains appropriate    Co-evaluation    PT/OT/SLP Co-Evaluation/Treatment: Yes Reason for Co-Treatment: Complexity of the patient's impairments (multi-system involvement);Necessary to address cognition/behavior during functional activity;For patient/therapist safety;To address functional/ADL transfers   OT goals addressed during session: ADL's and self-care;Strengthening/ROM      AM-PAC OT "6 Clicks" Daily Activity     Outcome Measure   Help from another person eating meals?: A Lot Help from another person taking care of personal grooming?: A Lot Help from another person toileting, which includes using toliet, bedpan, or urinal?: A Lot Help from  another person bathing (including washing, rinsing, drying)?: A Lot Help from another person to put on and taking off regular upper body clothing?: A Lot Help from another person to put on and taking off regular lower body clothing?: Total 6 Click Score: 11    End of Session Equipment Utilized During Treatment: Other (comment)(stedy)  OT Visit Diagnosis: Other abnormalities of gait and mobility (R26.89);Muscle weakness (generalized) (M62.81);Pain Pain - part of body: (abdomen)   Activity Tolerance Patient tolerated treatment well   Patient Left in chair;with call bell/phone within reach;with chair alarm set;with nursing/sitter in room   Nurse Communication Mobility status;Precautions        Time: 7893-8101 OT Time Calculation (min): 32 min  Charges: OT General Charges $OT Visit: 1 Visit OT Treatments $Self Care/Home Management : 8-22 mins  Delight Stare, [place] Pager [phone] Office [phone]    Delight Stare 11/08/2018, 2:38 PM

## 2018-11-08 NOTE — Progress Notes (Addendum)
Physical Therapy Treatment Patient Details Name: Audrey Collier MRN: [id number] DOB: 08/23/1960 Today's Date: 11/08/2018    History of Present Illness 58 yo female presented with fever, vomiting, weakness.  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  Echo showed vegetation.  Transferred to ICU 8/04 due to respiratory failure.  Underwent AVR with TEE intraoprative on 10/07/18. Extubated 10/08/18.  PEG placed 11/04/18.    PT Comments    Patient progressing this session with sit to stand with less assist needed and patient more cooperative overall despite having abdominal pain and throwing up prior to mobility.  She could potentially benefit from [facility] if continues with medical issues, otherwise, she is appropriate for SNF.  PT to follow acutely.  Follow Up Recommendations  SNF;LTACH     Equipment Recommendations  Other (comment)(TBA)    Recommendations for Other Services       Precautions / Restrictions Precautions Precautions: Fall;Sternal Restrictions Weight Bearing Restrictions: Yes Other Position/Activity Restrictions: sternal precautions     Mobility  Bed Mobility Overal bed mobility: Needs Assistance Bed Mobility: Supine to Sit     Supine to sit: Mod assist;+2 for physical assistance;HOB elevated     General bed mobility comments: pt initating movement of LEs towards EOB, but requires mod assist to fully complete motion, assist trunk upright and scoot hips towards EOB using pads   Transfers Overall transfer level: Needs assistance   Transfers: Sit to/from Stand;Stand Pivot Transfers Sit to Stand: Mod assist;+2 physical assistance;From elevated surface Stand pivot transfers: Total assist(in Stedy)       General transfer comment: requires assist to power up into standing with BUE supported on stedy   Ambulation/Gait                 Stairs             Wheelchair Mobility    Modified Rankin (Stroke Patients Only)       Balance Overall balance  assessment: Needs assistance Sitting-balance support: No upper extremity supported;Feet supported Sitting balance-Leahy Scale: Fair Sitting balance - Comments: min-min guard for dynamic EOB, R lateral and posterior lean   Standing balance support: Bilateral upper extremity supported;During functional activity Standing balance-Leahy Scale: Poor Standing balance comment: stood briefly while replacing pad under her and getting flaps down on Stedy                            Cognition Arousal/Alertness: Awake/alert Behavior During Therapy: Flat affect Overall Cognitive Status: History of cognitive impairments - at baseline Area of Impairment: Attention;Following commands;Safety/judgement;Problem solving;Awareness                   Current Attention Level: Selective   Following Commands: Follows one step commands inconsistently;Follows one step commands with increased time Safety/Judgement: Decreased awareness of deficits Awareness: Intellectual Problem Solving: Slow processing;Decreased initiation;Requires verbal cues;Requires tactile cues General Comments: continues to require increased time for processing and initiation       Exercises General Exercises - Upper Extremity Shoulder Flexion: AAROM;Both;10 reps;Supine Shoulder Extension: AAROM;Both;10 reps;Supine Elbow Flexion: AAROM;Both;10 reps;Supine Elbow Extension: AAROM;Both;10 reps;Supine Wrist Flexion: AAROM;Left;10 reps;Supine Wrist Extension: AAROM;Left;10 reps;Supine Digit Composite Flexion: AROM;Left;10 reps;Supine Composite Extension: AROM;Left;10 reps;Supine General Exercises - Lower Extremity Ankle Circles/Pumps: AROM;10 reps;Seated;Both Long Arc Quad: AROM;Both;10 reps;Seated Other Exercises Other Exercises: dynamic reaching at EOB foward x 5 reps each UE to target, AAROM required with L UE due to edema/weakness     General Comments General  comments (skin integrity, edema, etc.): Patient had  vomited in bed so changed her gown and linens under her and assisted to chair      Pertinent Vitals/Pain Pain Assessment: Faces Faces Pain Scale: Hurts little more Pain Location: abdomen Pain Descriptors / Indicators: Grimacing;Discomfort Pain Intervention(s): Monitored during session;Limited activity within patient's tolerance    Home Living                      Prior Function            PT Goals (current goals can now be found in the care plan section) Acute Rehab PT Goals Patient Stated Goal: none stated today Progress towards PT goals: Progressing toward goals    Frequency    Min 3X/week      PT Plan Current plan remains appropriate    Co-evaluation PT/OT/SLP Co-Evaluation/Treatment: Yes Reason for Co-Treatment: For patient/therapist safety;To address functional/ADL transfers PT goals addressed during session: Mobility/safety with mobility;Strengthening/ROM OT goals addressed during session: ADL's and self-care;Strengthening/ROM      AM-PAC PT "6 Clicks" Mobility   Outcome Measure  Help needed turning from your back to your side while in a flat bed without using bedrails?: A Lot Help needed moving from lying on your back to sitting on the side of a flat bed without using bedrails?: Total Help needed moving to and from a bed to a chair (including a wheelchair)?: Total Help needed standing up from a chair using your arms (e.g., wheelchair or bedside chair)?: Total Help needed to walk in hospital room?: Total Help needed climbing 3-5 steps with a railing? : Total 6 Click Score: 7    End of Session   Activity Tolerance: Patient tolerated treatment well Patient left: in chair;with call bell/phone within reach;with chair alarm set   PT Visit Diagnosis: Muscle weakness (generalized) (M62.81);Other abnormalities of gait and mobility (R26.89)     Time: [id number] PT Time Calculation (min) (ACUTE ONLY): 32 min  Charges:  $Therapeutic Activity: 8-22  mins                     Audrey Collier, [place]PT Acute Rehabilitation Services [phone] 11/08/2018    Audrey Collier 11/08/2018, 4:31 PM

## 2018-11-08 NOTE — Progress Notes (Addendum)
[facility] DAILY ICU PROGRESS NOTE                   [address]            [place] [address]          [phone]   5 Days Post-Op Procedure(s) (LRB): ESOPHAGOGASTRODUODENOSCOPY (EGD) WITH PROPOFOL (N/A) BIOPSY  Total Length of Stay:  LOS: 38 days   Subjective: Patient awake and alert  Objective: Vital signs in last 24 hours: Temp:  [97.9 F (36.6 C)-98.5 F (36.9 C)] 97.9 F (36.6 C) (09/08 0343) Pulse Rate:  [69-83] 76 (09/08 0700) Cardiac Rhythm: Normal sinus rhythm (09/08 0400) Resp:  [18-26] 23 (09/08 0700) BP: (105-133)/(55-79) 127/71 (09/08 0700) SpO2:  [89 %-100 %] 100 % (09/08 0700) Weight:  [96.6 kg] 96.6 kg (09/08 0500)  Filed Weights   11/06/18 0500 11/07/18 0500 11/08/18 0500  Weight: 89.2 kg 91.2 kg 96.6 kg   Weight change: 5.4 kg      Intake/Output from previous day: 09/07 0701 - 09/08 0700 In: 3136.9 [I.V.:900; Blood:945; IV Piggyback:571.9] Out: 1625 [Urine:1625]  Intake/Output this shift: No intake/output data recorded.  Current Meds: Scheduled Meds:  sodium chloride   Intravenous Once   amLODipine  5 mg Per Tube Daily   calcium-vitamin D  1 tablet Oral Q breakfast   Chlorhexidine Gluconate Cloth  6 each Topical Daily   FLUoxetine  10 mg Per Tube QHS   levothyroxine  150 mcg Oral Q0600   mouth rinse  15 mL Mouth Rinse BID   metoCLOPramide (REGLAN) injection  10 mg Intravenous Q6H   metoprolol tartrate  25 mg Per Tube BID   montelukast  10 mg Oral QHS   multivitamin  5 mL Oral Daily   pantoprazole (PROTONIX) IV  40 mg Intravenous Q24H   rifampin  300 mg Oral Q8H   rosuvastatin  5 mg Oral q1800   sodium chloride flush  10-40 mL Intracatheter Q12H   sodium chloride flush  3 mL Intravenous Q12H   tamsulosin  0.4 mg Oral Daily   Continuous Infusions:  sodium chloride 500 mL (10/24/18 2031)   dextrose 5 % and 0.45% NaCl 75 mL/hr at 11/07/18 0842   piperacillin-tazobactam (ZOSYN)  IV 3.375 g (11/08/18  0646)   [START ON 11/09/2018] vancomycin     PRN Meds:.sodium chloride, acetaminophen, bisacodyl **OR** bisacodyl, lactulose, magic mouthwash, metoprolol tartrate, morphine injection, ondansetron (ZOFRAN) IV, oxyCODONE, sodium chloride flush, sodium chloride flush, traMADol  Heart: RRR, no murmur Lungs: Slightly diminished bibasilar breath sounds Abdomen: Semi soft, protuberant, occaisonal bowel sounds. Patient does not grimace with palpation but when asked if belly hurts, she nods yes Extremities: SCDs in place Wound: Clean and dry  Lab Results: CBC: Recent Labs    11/07/18 0112 11/08/18 0112  WBC 6.8 7.0  HGB 6.8* 8.8*  HCT 22.2* 27.4*  PLT 90* 93*   BMET:  Recent Labs    11/07/18 0112 11/08/18 0318  NA 136 137  K 3.6 3.8  CL 102 102  CO2 24 25  GLUCOSE 144* 100*  BUN 44* 45*  CREATININE 2.01* 2.26*  CALCIUM 8.0* 8.1*    CMET: Lab Results  Component Value Date   WBC 7.0 11/08/2018   HGB 8.8 (L) 11/08/2018   HCT 27.4 (L) 11/08/2018   PLT 93 (L) 11/08/2018   GLUCOSE 100 (H) 11/08/2018   TRIG 159 (H) 10/05/2018   ALT <5 11/07/2018   AST 22 11/07/2018  NA 137 11/08/2018   K 3.8 11/08/2018   CL 102 11/08/2018   CREATININE 2.26 (H) 11/08/2018   BUN 45 (H) 11/08/2018   CO2 25 11/08/2018   TSH 19.826 (H) 10/30/2018   INR 1.3 (H) 11/03/2018      PT/INR: No results for input(s): LABPROT, INR in the last 72 hours. Radiology: Ct Abdomen Pelvis Wo Contrast  Result Date: 11/07/2018 CLINICAL DATA:  Abdominal pain and bleeding after percutaneous gastrostomy tube placement. EXAM: CT ABDOMEN AND PELVIS WITHOUT CONTRAST TECHNIQUE: Multidetector CT imaging of the abdomen and pelvis was performed following the standard protocol without IV contrast. COMPARISON:  Percutaneous gastrostomy tube placement-11/04/2018 FINDINGS: The lack of intravenous contrast limits the ability to evaluate solid abdominal organs. Lower chest: Limited visualization of the lower thorax demonstrates  a small right and trace left-sided pleural effusions with associated bibasilar heterogeneous/consolidative opacities, similar to the 10/31/2018 examination. Borderline cardiomegaly. There is diffuse decreased attenuation intra cardiac blood pool suggestive of anemia. There is a minimal amount of pericardial thickening without associated effusion. Post median sternotomy with adjacent stranding. Hepatobiliary: Normal hepatic contour. Normal noncontrast appearance of the gallbladder given degree distention. No radiopaque gallstones. There is a trace amount of perihepatic ascites, similar to the 10/31/2018 examination. Pancreas: Normal noncontrast appearance of the pancreas. Spleen: Normal noncontrast appearance of the spleen. Adrenals/Urinary Tract: Mild bilateral pelviectasis without associated caliectasis, left greater than right, similar to the 10/2018 examination. Minimal amount of grossly symmetric bilateral perinephric stranding, likely age and body habitus related. No renal stones. No renal stones are seen along expected course of either ureter or the urinary bladder. There is mild thickening of the left adrenal gland without discrete nodule. Normal noncontrast appearance of the right adrenal gland. Thickening of the urinary bladder wall, potentially accentuated due to underdistention in the setting of a Foley catheter. Stomach/Bowel: Interval placement disc retention gastrostomy tube which appears appropriately positioned within the stomach. There are scattered foci of pneumoperitoneum within the upper abdomen presumably the sequela of recent gastrostomy tube placement. Radiopaque enteric contrast has been ingested and is seen extending to the level of the transverse colon. Large stool burden within the rectal vault without evidence of enteric obstruction. The cecum is noted to be located within the right mid hemiabdomen. Normal appearance of the chest at normal noncontrast appearance of the terminal ileum and  appendix. No discrete areas of bowel wall thickening. No pneumatosis or portal venous gas. Vascular/Lymphatic: Normal caliber the abdominal aorta. 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 No bulky retroperitoneal, mesenteric, pelvic or inguinal lymphadenopathy on this noncontrast examination. Reproductive: Normal noncontrast appearance of the pelvic organs for age. No discrete adnexal lesion. Small amount of free fluid within the pelvic cul-de-sac. Other: Diffuse body wall anasarca. Musculoskeletal: No acute or aggressive osseous abnormalities. IMPRESSION: 1. Appropriately positioned percutaneous gastrostomy tube without definitive evidence of complication on this noncontrast examination. There is a small amount of residual pneumoperitoneum however this is considered to be an expected finding following percutaneous gastrostomy tube placement performed 11/04/2019. 2. Large stool burden within the rectal vault without evidence of enteric obstruction. 3. Cardiomegaly with findings suggestive of pulmonary edema including small right and trace left-sided pleural effusions, diffuse body wall anasarca and small amount of intra-abdominal ascites. 4. The urinary bladder remains decompressed via Foley catheter though appears thick wall with associated mild bilateral pelviectasis, nonspecific though could be seen in the setting urinary tract infection. Correlation with urinalysis is advised. Electronically Signed   By:  Sandi Mariscal M.D.   On: 11/07/2018 14:25   Dg Chest Port 1 View  Result Date: 11/07/2018 CLINICAL DATA:  S/P AVR abdo pain with distention PPE: I donned gloves, surgical mask EXAM: PORTABLE CHEST 1 VIEW COMPARISON:  Chest radiograph 10/15/2018, 10/12/2018 FINDINGS: Stable cardiomediastinal contours status post median sternotomy and valvular replacement. Interval placement of a left upper extremity PICC with tip projecting over the cavoatrial junction. There are new  bilateral diffuse patchy airspace opacities and small bilateral pleural effusions. No pneumothorax. No acute finding in the visualized skeleton. IMPRESSION: 1. New bilateral diffuse patchy airspace opacities and small bilateral pleural effusions suspicious for pulmonary edema, infection not excluded. 2. Left upper extremity PICC tip projects over the cavoatrial junction. Electronically Signed   By: Audie Pinto M.D.   On: 11/07/2018 10:54   Dg Abd Portable 1v  Result Date: 11/07/2018 CLINICAL DATA:  Abdominal pain and distention. EXAM: PORTABLE ABDOMEN - 1 VIEW COMPARISON:  Plain films of the abdomen 10/21/2018. FINDINGS: Peg noted. The bowel gas pattern is normal. No radio-opaque calculi or other significant radiographic abnormality are seen. IMPRESSION: No acute abnormality. Electronically Signed   By: Inge Rise M.D.   On: 11/07/2018 09:28   US Abdomen Limited Ruq  Result Date: 11/07/2018 CLINICAL DATA:  58 year old female with right upper quadrant abdominal pain. EXAM: ULTRASOUND ABDOMEN LIMITED RIGHT UPPER QUADRANT COMPARISON:  Abdominal CT dated 11/07/2018 and ultrasound dated 11/01/2018 FINDINGS: Gallbladder: There is sludge and a 2 cm stone in the gallbladder. There is no gallbladder wall thickening. No sonographic Murphy's sign. Small pericholecystic fluid as well as perihepatic fluid noted. Common bile duct: Diameter: 5 mm Liver: Slightly nodular liver contour in keeping with early changes of cirrhosis. Portal vein is patent on color Doppler imaging with normal direction of blood flow towards the liver. Other: Partially visualized right pleural effusion. IMPRESSION: 1. Cholelithiasis without sonographic evidence of acute cholecystitis. 2. Probable early changes of cirrhosis. 3. Right-sided pleural effusion and small perihepatic ascites. Electronically Signed   By: Anner Crete M.D.   On: 11/07/2018 19:04     Assessment/Plan: S/P Procedure(s) (LRB): ESOPHAGOGASTRODUODENOSCOPY  (EGD) WITH PROPOFOL (N/A) BIOPSY   1. CV-SR in the 70's. On Lopressor 25 mg bid and Amlodipine 5 mg daily 2. Pulmonary-on 2 liters via [place]. 3. GI-s/p PEG 09/04. PEG is to drain this am. KUB yesterday showed no abnormality. [place] of RUQ showed sludge and a 2 cm stone in the gallbladder. There is no gallbladder wall thickening or sonographic Murphy's sign. CT abd/pelvis done yesterday showed appropriately positioned percutaneous gastrostomy tube without definitive evidence of complication on this noncontrast examination, large stool burden within the rectal vault without evidence of enteric obstruction. Also, urinary bladder remains decompressed via Foley catheter though appears thick wall with associated mild bilateral pelviectasis and cardiomegaly with findings suggestive of pulmonary edema including small right and trace left-sided pleural effusions. NPO. Will discuss with Dr. Kipp Brood when to resume night time TFs. 4. ABL anemia- H and H this am increased to 8.8 and 27.4. She received PRBCs last night. 5. Supplement potassium 6. ARF-Creatinine 2.26 this am. On IVF at 75 ml/hr 7. Thrombocytopenia-platlelets up to 93,000 8. ID- on Vancomycin and Zosyn for possible RLL PNA. 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 BC show no growth for 24 hours. UC showed multiple species again. 9. Hypothyroidism-on Levothyroxine 150 mcg daily  Donielle Liston Alba PA-C 11/08/2018 7:58 AM   Agree with above AKI -  will continue to follow Nutrition consult for nocturnal tube feeds.  Encourage PO intake during the day TTF once AKI resolves dispo planning.

## 2018-11-08 NOTE — Progress Notes (Signed)
Nutrition Follow-up  RD working remotely.  DOCUMENTATION CODES:   Not applicable  INTERVENTION:   Nocturnal tube feeding via PEG: - Osmolite 1.5 @ 80 ml/hr to run over 12 hours from 2000 to 0800 (total of 960 ml) - Pro-stat 30 ml TID  Nocturnal tube feeding regimen and Pro-stat provides 1740 kcal, 105 grams of protein, and 732 ml of H2O (97% of kcal needs, 100% of protein needs).  - Boost Breeze po TID, each supplement provides 250 kcal and 9 grams of protei  NUTRITION DIAGNOSIS:   Inadequate oral intake related to poor appetite as evidenced by meal completion < 50%.  Ongoing, being addressed via nocturnal TF  GOAL  Patient will meet greater than or equal to 90% of their needs  Progressing  MONITOR:   PO intake, Supplement acceptance, Diet advancement, TF tolerance, Weight trends, Labs, I & O's, Skin  REASON FOR ASSESSMENT:   Consult Enteral/tube feeding initiation and management (nocturnal)  ASSESSMENT:   58 yo female admitted with fever, vomiting, weakness. Found to have MSSA bacteremia with PNA. Echo showed vegetation. Required transfer to the ICU and intubation on 8/4. PMH includes developmental delay, GERD, anxiety, hypothyroidism, HLD.  8/06 - TEE showing large aortic valve vegetation 8/07 - s/p aortic valve replacement 8/08 - extubated, diet advanced to Dysphagia 2 8/21 - Cortrak placement(tip in stomach); spoke with T[facility], ok to start TF 9/01 - HIDA negative 9/03 - Cortrak pulled during EGD 9/04 - s/p PEG placement 9/07 - PEG to gravity, CT abdomen negative  Noted pt with multiple episodes of vomiting. Pt on clear liquid diet since 9/08 and was NPO on 9/07.  Weight up 11 lbs overnight. Per RN edema assessment, pt with deep pitting generalized edema, non-pitting edema to BUE, and moderate pitting edema to BLE.  RD consulted for nocturnal tube feeds. No PO documented since 9/06.  Medications reviewed and include: Oscal with D, Reglan 10 mg q 6 hours,  liquid MVI, Protonix, IV abx IVF: D5 in 1/2NS @ 75 ml/hr  Labs reviewed: BUN 45, creatinine 2.26, hemoglobin 8.8 CBG's: 89-111 x 24 hours  UOP: 1625 ml x 24 hours I/O's: +12.9 L since admit  Diet Order:   Diet Order            Diet clear liquid Room service appropriate? Yes; Fluid consistency: Thin  Diet effective now              EDUCATION NEEDS:   No education needs have been identified at this time  Skin:  Skin Assessment: Skin Integrity Issues: Skin Integrity Issues: Stage II: right buttocks Incisions: chest  Last BM:  11/03/18  Height:   Ht Readings from Last 1 Encounters:  10/07/18 5\' 2"  (1.575 m)    Weight:   Wt Readings from Last 1 Encounters:  11/08/18 96.6 kg    Ideal Body Weight:  56.8 kg  BMI:  Body mass index is 38.95 kg/m.  Estimated Nutritional Needs:   Kcal:  1800-2000  Protein:  100-115 gm  Fluid:  >/= 1.8 L    Gaynell Face, MS, RD, LDN Inpatient Clinical Dietitian Pager: [phone] Weekend/After Hours: [phone]

## 2018-11-08 NOTE — Progress Notes (Signed)
      [address]       [place] [address]             [phone]      Stable day No new issues this evening  Remo Lipps C. Roxan Hockey, MD [facility] [phone]

## 2018-11-08 NOTE — Progress Notes (Signed)
OT Cancellation Note  Patient Details Name: Audrey Collier MRN: [id number] DOB: [birth year]   Cancelled Treatment:    Reason Eval/Treat Not Completed: Medical issues which prohibited therapy, patient reports abdominal pain with RN requesting therapy to hold due to orders for disimpaction this morning. Will follow and see as able.  Delight Stare, OT Acute Rehabilitation Services Pager [phone] Office [phone]   Delight Stare 11/08/2018, 10:18 AM

## 2018-11-09 ENCOUNTER — Ambulatory Visit: Payer: Medicaid Other | Admitting: Infectious Disease

## 2018-11-09 DIAGNOSIS — Z515 Encounter for palliative care: Secondary | ICD-10-CM

## 2018-11-09 LAB — GLUCOSE, CAPILLARY
Glucose-Capillary: 157 mg/dL — ABNORMAL HIGH (ref 70–99)
Glucose-Capillary: 83 mg/dL (ref 70–99)

## 2018-11-09 LAB — BASIC METABOLIC PANEL
Anion gap: 9 (ref 5–15)
BUN: 54 mg/dL — ABNORMAL HIGH (ref 6–20)
CO2: 24 mmol/L (ref 22–32)
Calcium: 8 mg/dL — ABNORMAL LOW (ref 8.9–10.3)
Chloride: 101 mmol/L (ref 98–111)
Creatinine, Ser: 2.69 mg/dL — ABNORMAL HIGH (ref 0.44–1.00)
GFR calc Af Amer: 22 mL/min — ABNORMAL LOW (ref >60)
GFR calc non Af Amer: 19 mL/min — ABNORMAL LOW (ref >60)
Glucose, Bld: 159 mg/dL — ABNORMAL HIGH (ref 70–99)
Potassium: 3.8 mmol/L (ref 3.5–5.1)
Sodium: 134 mmol/L — ABNORMAL LOW (ref 135–145)

## 2018-11-09 MED ORDER — POTASSIUM CHLORIDE 20 MEQ/15ML (10%) PO SOLN
20.0000 meq | Freq: Every day | ORAL | Status: DC
  Administered 2018-11-09 – 2018-11-11 (×3): 20 meq
  Filled 2018-11-09 (×3): qty 15

## 2018-11-09 MED ORDER — ALBUMIN HUMAN 25 % IV SOLN
12.5000 g | Freq: Once | INTRAVENOUS | Status: AC
  Administered 2018-11-09: 09:00:00 12.5 g via INTRAVENOUS
  Filled 2018-11-09: qty 50

## 2018-11-09 NOTE — Ethics Note (Signed)
11-09-18  Lupe Carney. Verma MRN : [id number] Consult request per Dr. Kipp Brood  Received call to ethics pager from Dr. Kipp Brood regarding his concern over inability to get in touch with family/decision makers for discussion regarding current medical situation  and need for disposition plan.  I shared with Dr. Kipp Brood that this is really a social work/disposition issue and recommended conversation with SW/CM and  a palliative medicine consult.   A palliative medicine consult was ordered and in an attempt to secure a scheduled time I spoke to contact persons listed in the chart.  I was able to speak to [facility] by marriage, [name] is married to [place] who is the patient's Nurse, children's.  This patient has been living with [name] and Laverna Peace for more than 20 years.  According to [name], although the patient did complete high school she has never been able to live on her own secondary to her  developmental delays.  She describes her as "childlike".  I then spoke to [employer] her Systems developer.  Although [name] verbalizes her caring concern for the patient she describes complicated family dynamics in which she never really had hands on care or involvement in decisions for this patient.  Unfortunately there is no documented guardian or healthcare power of attorney.  Patrice Paradise and her husband Laverna Peace are willing to be the main decision makers for this patient and Annett Gula clearly verbalizes her support for that and her desire to NOT be involved in health care decisions.  A palliative medicine consult has been scheduled for tomorrow at 1 PM, Patrice Paradise will meet with Alda Lea NP.  Wadie Lessen Np/ Ethics Committee   No charge

## 2018-11-09 NOTE — Progress Notes (Addendum)
TCTS DAILY ICU PROGRESS NOTE                   [address]            [place] [id number]          [phone]   6 Days Post-Op Procedure(s) (LRB): ESOPHAGOGASTRODUODENOSCOPY (EGD) WITH PROPOFOL (N/A) BIOPSY  Total Length of Stay:  LOS: 39 days   Subjective:  Patient moved bowels yesterday, states she feels a little better.  States she continues to have nausea  Objective: Vital signs in last 24 hours: Temp:  [97.7 F (36.5 C)-98.9 F (37.2 C)] 98.5 F (36.9 C) (09/09 0400) Pulse Rate:  [66-80] 80 (09/09 0700) Cardiac Rhythm: Normal sinus rhythm (09/09 0400) Resp:  [18-24] 20 (09/09 0700) BP: (107-149)/(60-90) 120/71 (09/09 0700) SpO2:  [91 %-100 %] 95 % (09/09 0700) Weight:  [96 kg] 96 kg (09/09 0424)  Filed Weights   11/07/18 0500 11/08/18 0500 11/09/18 0424  Weight: 91.2 kg 96.6 kg 96 kg    Weight change: -0.6 kg   Hemodynamic parameters for last 24 hours:    Intake/Output from previous day: 09/08 0701 - 09/09 0700 In: 2703.2 [P.O.:10; I.V.:857.9; NG/GT:1069.7; IV Piggyback:165.6] Out: 2556 [Urine:2355; Drains:200; Stool:1]  Intake/Output this shift: No intake/output data recorded.  Current Meds: Scheduled Meds: . sodium chloride   Intravenous Once  . amLODipine  5 mg Per Tube Daily  . calcium-vitamin D  1 tablet Oral Q breakfast  . Chlorhexidine Gluconate Cloth  6 each Topical Daily  . feeding supplement  1 Container Oral TID BM  . feeding supplement (PRO-STAT SUGAR FREE 64)  30 mL Per Tube TID  . FLUoxetine  10 mg Per Tube QHS  . levothyroxine  150 mcg Oral Q0600  . mouth rinse  15 mL Mouth Rinse BID  . metoCLOPramide (REGLAN) injection  10 mg Intravenous Q6H  . metoprolol tartrate  25 mg Per Tube BID  . montelukast  10 mg Oral QHS  . multivitamin  15 mL Oral Daily  . pantoprazole (PROTONIX) IV  40 mg Intravenous Q24H  . rifampin  300 mg Oral Q8H  . rosuvastatin  5 mg Oral q1800  . sodium chloride flush  3 mL Intravenous Q12H  .  tamsulosin  0.4 mg Oral Daily   Continuous Infusions: . sodium chloride 500 mL (10/24/18 2031)  . dextrose 5 % and 0.45% NaCl Stopped (11/09/18 0554)  . feeding supplement (OSMOLITE 1.5 CAL) 1,000 mL (11/08/18 2104)  . piperacillin-tazobactam (ZOSYN)  IV 12.5 mL/hr at 11/09/18 0600  . vancomycin     PRN Meds:.sodium chloride, acetaminophen, bisacodyl **OR** bisacodyl, lactulose, magic mouthwash, metoprolol tartrate, morphine injection, ondansetron (ZOFRAN) IV, oxyCODONE, sodium chloride flush, traMADol  General appearance: alert and no distress Heart: regular rate and rhythm Lungs: diminished breath sounds bibasilar Abdomen: distention, remains tender, + BS Extremities: edema 2-3+ pitting Wound: clean and dry.  Lab Results: CBC: Recent Labs    11/07/18 0112 11/08/18 0112  WBC 6.8 7.0  HGB 6.8* 8.8*  HCT 22.2* 27.4*  PLT 90* 93*   BMET:  Recent Labs    11/08/18 0318 11/09/18 0425  NA 137 134*  K 3.8 3.8  CL 102 101  CO2 25 24  GLUCOSE 100* 159*  BUN 45* 54*  CREATININE 2.26* 2.69*  CALCIUM 8.1* 8.0*    CMET: Lab Results  Component Value Date   WBC 7.0 11/08/2018   HGB 8.8 (L) 11/08/2018   HCT  27.4 (L) 11/08/2018   PLT 93 (L) 11/08/2018   GLUCOSE 159 (H) 11/09/2018   TRIG 159 (H) 10/05/2018   ALT <5 11/07/2018   AST 22 11/07/2018   NA 134 (L) 11/09/2018   K 3.8 11/09/2018   CL 101 11/09/2018   CREATININE 2.69 (H) 11/09/2018   BUN 54 (H) 11/09/2018   CO2 24 11/09/2018   TSH 19.826 (H) 10/30/2018   INR 1.3 (H) 11/03/2018      PT/INR: No results for input(s): LABPROT, INR in the last 72 hours. Radiology: No results found.   Assessment/Plan: S/P Procedure(s) (LRB): ESOPHAGOGASTRODUODENOSCOPY (EGD) WITH PROPOFOL (N/A) BIOPSY  1. CV- NSR, BP controlled- on Lopressor, Norvasc 2. Pulm- on 2L via [place], small effusions on CXR from 9/7, continue IS 3. Renal- AKI, creatinine is up to 2.69, she is edematous on exam, if weight is accurate she is up more than  30 lbs from baseline, she had mild pulmonary edema on last CXR... may benefit from Lasix and possible Nephrology consult if creatinine continues to rise 4. ID- afebrile, on ABX for Endocarditis, per ID recommendations, completion date is 9/17 5. GI- abdomen remains distended, she did move her bowels yesterday, oral intake remains poor, continue nightly tube feeds, continue to encouraged oral intake, CT scan of abdomen did not show any gross abnormalities, accept large stool burden, if continues to have issues with constipation, could start Miralax 6. GU- urinary retention, foley remains in place, multiple urine cultures have been sent that suggest contamination 7. Thrombocytopenia stable yesterday, will get repeat CBC, BMET 8. Deconditioning- patient works with PT/OT placement continues to be a challenge, needs SNF 9. Dispo- patient in NSR, BP controlled, creatinine continues to rise up to 2.69, she is edematous on exam, may benefit from lasix, possible Nephro consult if creatinine doesn't stabilize, continue to work on nutrition, watch bowel function, foley in place, continue current care     Ellwood Handler 11/09/2018 7:48 AM   Agree with above Giving 25% albumin for edema Have consulted Ethics for concern of family neglect Will also consult palliative care to assist with family interaction  Lajuana Matte

## 2018-11-10 DIAGNOSIS — Z7189 Other specified counseling: Secondary | ICD-10-CM

## 2018-11-10 DIAGNOSIS — E46 Unspecified protein-calorie malnutrition: Secondary | ICD-10-CM

## 2018-11-10 LAB — CBC WITH DIFFERENTIAL/PLATELET
Abs Immature Granulocytes: 0.07 10*3/uL (ref 0.00–0.07)
Basophils Absolute: 0 10*3/uL (ref 0.0–0.1)
Basophils Relative: 0 %
Eosinophils Absolute: 0.4 10*3/uL (ref 0.0–0.5)
Eosinophils Relative: 5 %
HCT: 33.1 % — ABNORMAL LOW (ref 36.0–46.0)
Hemoglobin: 10.4 g/dL — ABNORMAL LOW (ref 12.0–15.0)
Immature Granulocytes: 1 %
Lymphocytes Relative: 7 %
Lymphs Abs: 0.6 10*3/uL — ABNORMAL LOW (ref 0.7–4.0)
MCH: 30 pg (ref 26.0–34.0)
MCHC: 31.4 g/dL (ref 30.0–36.0)
MCV: 95.4 fL (ref 80.0–100.0)
Monocytes Absolute: 0.5 10*3/uL (ref 0.1–1.0)
Monocytes Relative: 6 %
Neutro Abs: 6.8 10*3/uL (ref 1.7–7.7)
Neutrophils Relative %: 81 %
Platelets: 105 10*3/uL — ABNORMAL LOW (ref 150–400)
RBC: 3.47 MIL/uL — ABNORMAL LOW (ref 3.87–5.11)
RDW: 16.3 % — ABNORMAL HIGH (ref 11.5–15.5)
WBC: 8.4 10*3/uL (ref 4.0–10.5)
nRBC: 0 % (ref 0.0–0.2)

## 2018-11-10 LAB — CBC
HCT: 29.5 % — ABNORMAL LOW (ref 36.0–46.0)
Hemoglobin: 9.2 g/dL — ABNORMAL LOW (ref 12.0–15.0)
MCH: 30.1 pg (ref 26.0–34.0)
MCHC: 31.2 g/dL (ref 30.0–36.0)
MCV: 96.4 fL (ref 80.0–100.0)
Platelets: 91 10*3/uL — ABNORMAL LOW (ref 150–400)
RBC: 3.06 MIL/uL — ABNORMAL LOW (ref 3.87–5.11)
RDW: 16.5 % — ABNORMAL HIGH (ref 11.5–15.5)
WBC: 7.2 10*3/uL (ref 4.0–10.5)
nRBC: 0 % (ref 0.0–0.2)

## 2018-11-10 LAB — BASIC METABOLIC PANEL
Anion gap: 13 (ref 5–15)
BUN: 62 mg/dL — ABNORMAL HIGH (ref 6–20)
CO2: 22 mmol/L (ref 22–32)
Calcium: 8.1 mg/dL — ABNORMAL LOW (ref 8.9–10.3)
Chloride: 103 mmol/L (ref 98–111)
Creatinine, Ser: 3 mg/dL — ABNORMAL HIGH (ref 0.44–1.00)
GFR calc Af Amer: 19 mL/min — ABNORMAL LOW (ref >60)
GFR calc non Af Amer: 17 mL/min — ABNORMAL LOW (ref >60)
Glucose, Bld: 146 mg/dL — ABNORMAL HIGH (ref 70–99)
Potassium: 4.2 mmol/L (ref 3.5–5.1)
Sodium: 138 mmol/L (ref 135–145)

## 2018-11-10 LAB — GLUCOSE, CAPILLARY
Glucose-Capillary: 114 mg/dL — ABNORMAL HIGH (ref 70–99)
Glucose-Capillary: 115 mg/dL — ABNORMAL HIGH (ref 70–99)
Glucose-Capillary: 88 mg/dL (ref 70–99)

## 2018-11-10 MED ORDER — CEFAZOLIN SODIUM-DEXTROSE 2-4 GM/100ML-% IV SOLN
2.0000 g | Freq: Two times a day (BID) | INTRAVENOUS | Status: AC
  Administered 2018-11-10 – 2018-11-17 (×13): 2 g via INTRAVENOUS
  Filled 2018-11-10 (×19): qty 100

## 2018-11-10 NOTE — Plan of Care (Signed)
°  Problem: Clinical Measurements: °Goal: Will remain free from infection °Outcome: Progressing °Goal: Cardiovascular complication will be avoided °Outcome: Progressing °  °Problem: Coping: °Goal: Level of anxiety will decrease °Outcome: Progressing °  °Problem: Safety: °Goal: Ability to remain free from injury will improve °Outcome: Progressing °  °Problem: Skin Integrity: °Goal: Risk for impaired skin integrity will decrease °Outcome: Progressing °  °

## 2018-11-10 NOTE — Progress Notes (Signed)
Physical Therapy Treatment Patient Details Name: Audrey Collier MRN: [id number] DOB: February 02, 1961 Today's Date: 11/10/2018    History of Present Illness 58 yo female presented with fever, vomiting, weakness.  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  Echo showed vegetation.  Transferred to ICU 8/04 due to respiratory failure.  Underwent AVR with TEE intraoprative on 10/07/18. Extubated 10/08/18.  PEG placed 11/04/18.    PT Comments    Pt admitted with above diagnosis. Pt was able to participate with PT today.  Attempts to stand to RW were successful however once pt stood she would not take any steps and maintained a posterior pelvic tilt with flexed hips, knees and trunk.  Obtained Stedy and pt stood to [place] and PT was then able to move pt to the chair via Stedy and +2 mod assist.  Pt progressing although slowly.   Pt currently with functional limitations due to balance and endurance deficits. Pt will benefit from skilled PT to increase their independence and safety with mobility to allow discharge to the venue listed below.     Follow Up Recommendations  SNF;LTACH     Equipment Recommendations  Other (comment)(TBA)    Recommendations for Other Services       Precautions / Restrictions Precautions Precautions: Fall;Sternal Precaution Comments: verbally reviewed with patient during functional tasks  Restrictions Weight Bearing Restrictions: No Other Position/Activity Restrictions: sternal precautions     Mobility  Bed Mobility Overal bed mobility: Needs Assistance Bed Mobility: Supine to Sit Rolling: +2 for physical assistance;Mod assist Sidelying to sit: Mod assist;+2 for physical assistance       General bed mobility comments: pt initating movement of LEs towards EOB, but requires mod assist to fully complete motion, assist trunk upright and scoot hips towards EOB using pads   Transfers Overall transfer level: Needs assistance Equipment used: Rolling walker (2  wheeled) Transfers: Sit to/from [facility] Sit to Stand: Mod assist;+2 physical assistance;From elevated surface Stand pivot transfers: Total assist(in Stedy)       General transfer comment: 3 attempts to stand to RW.  Pt flexes trunk and leans slihgtly posterior unable to achieve anterior tilt to stand for any period of time.  Pt cued to pull pelvis forward but pt would then sit back on bed.  After third try of standing withRW obtained Stedy as pt would not pivot to chair but kept sitting back down.  Pt requires assist to power up into standing with BUE supported on stedy. Also needed cues to perform anterior pelvic tilt enough to place pads under pts buttocks.    Ambulation/Gait             General Gait Details: unable   Stairs             Wheelchair Mobility    Modified Rankin (Stroke Patients Only)       Balance Overall balance assessment: Needs assistance Sitting-balance support: No upper extremity supported;Feet supported Sitting balance-Leahy Scale: Fair Sitting balance - Comments: min guard for dynamic EOB, supervision for static sitting   Standing balance support: Bilateral upper extremity supported;During functional activity Standing balance-Leahy Scale: Poor Standing balance comment: Stood x 4 x with min to mod assist to stand as pt does not stand fully upright and keeps her knees flexed as well as maintains a posterior pelvic tilt and does not stand fully uprihgt even with verbal and tactile cues and assist.  Cognition Arousal/Alertness: Awake/alert Behavior During Therapy: Flat affect Overall Cognitive Status: History of cognitive impairments - at baseline Area of Impairment: Attention;Following commands;Safety/judgement;Problem solving;Awareness                   Current Attention Level: Sustained   Following Commands: Follows one step commands inconsistently;Follows one step commands  with increased time Safety/Judgement: Decreased awareness of deficits Awareness: Intellectual Problem Solving: Slow processing;Decreased initiation;Requires verbal cues;Requires tactile cues General Comments: continues to require increased time for processing and initiation       Exercises General Exercises - Lower Extremity Ankle Circles/Pumps: AROM;10 reps;Seated;Both Long Arc Quad: AROM;Both;10 reps;Seated    General Comments        Pertinent Vitals/Pain Pain Assessment: No/denies pain    Home Living                      Prior Function            PT Goals (current goals can now be found in the care plan section) Acute Rehab PT Goals Patient Stated Goal: none stated today Progress towards PT goals: Progressing toward goals    Frequency    Min 3X/week      PT Plan Current plan remains appropriate    Co-evaluation              AM-PAC PT "6 Clicks" Mobility   Outcome Measure  Help needed turning from your back to your side while in a flat bed without using bedrails?: A Lot Help needed moving from lying on your back to sitting on the side of a flat bed without using bedrails?: A Lot Help needed moving to and from a bed to a chair (including a wheelchair)?: Total Help needed standing up from a chair using your arms (e.g., wheelchair or bedside chair)?: A Lot Help needed to walk in hospital room?: Total Help needed climbing 3-5 steps with a railing? : Total 6 Click Score: 9    End of Session Equipment Utilized During Treatment: Gait belt;Oxygen Activity Tolerance: Patient limited by fatigue Patient left: in chair;with call bell/phone within reach;with chair alarm set Nurse Communication: Mobility status PT Visit Diagnosis: Muscle weakness (generalized) (M62.81);Other abnormalities of gait and mobility (R26.89) Pain - Right/Left: (generalized) Pain - part of body: (abdomen)     Time: [id number] PT Time Calculation (min) (ACUTE ONLY): 23  min  Charges:  $Therapeutic Exercise: 8-22 mins $Therapeutic Activity: 8-22 mins                     Melissaann Dizdarevic,PT Acute Rehabilitation Services Pager:  [phone]  Office:  [phone]     Berline LopesDawn F Lonzie Simmer 11/10/2018, 10:33 AM

## 2018-11-10 NOTE — Consult Note (Signed)
Consultation Note Date: 11/10/2018   Patient Name: Audrey Collier  DOB: 1960/12/07  MRN: [id number]  Age / Sex: 58 y.o., female   PCP: Jani Gravel, MD Referring Physician: Lajuana Matte, MD   REASON FOR CONSULTATION:Establishing goals of care  Palliative Care consult requested for this 58 y.o. female with multiple medical problems including hypothyroidism, post menopausal bleeding, anxiety, GERD, cognitive/developmental delay. She presented from home with family with complaints of altered mental status. Aunt reports family returned from [facility] the several days prior to admission. Since their return home patient had decreased appetite and low grade fever. The day of admission patient was found by nurse lethargic with dried emesis on clothing. On arrival patient's temp was 103.8. SIRS positive, WBC 16.3. Chest x-ray showed bilateral pneumonia. COVID negative. Patient was started on antibiotics for pneumonia post cultures. Since admission patient underwent EGD due to continued abdominal pain. Also underwent aortic valve replacement on 10/07/2018. PEG placed on 11/04/2018. Palliative Medicine team consulted for goals of care.   Clinical Assessment and Goals of Care: I have reviewed medical records including lab results, imaging, Epic notes, and MAR, received report from the bedside RN, and assessed the patient. I met at the bedside with patient's aunt-in-law, Patrice Paradise and patient's uncle over the phone [place])  to discuss diagnosis prognosis, GOC, EOL wishes, disposition and options. Patient is alert to self and family only. She identified the year as 1997 and president as Maudie Flakes. Could not specify her location.  Patient's closes relatives is her uncle [name] and her aunt [name]. [name] reports she is deferring decisions to [name] and [name] as patient is in their custody and they are her primary caretakers.   I introduced Palliative Medicine as specialized medical care  for people living with serious illness. It focuses on providing relief from the symptoms and stress of a serious illness. The goal is to improve quality of life for both the patient and the family.  We discussed a brief life review of the patient, along with her functional and nutritional status. [name] and Jimmie reports patient has resided with them for 20 years since her mother passed away from cancer and Lupus. Prior to her mother passing patient resided with her mother who cared for patient and patient's mother's sister. [name] reports prior to patient's mother passing they agreed to care for patient until she passed away and be decision makers over her financial and medical well being.   Patient has been developmentally delayed since birth. She has always depended on someone to assist with her care. [name] reports in the home she played with baby dolls and enjoyed watching tv. Weeks prior to admission patient was ambulatory without assistive devices. She had a great appetite and enjoyed assisting with meal preparation. [facility] from [id number] and had a CNA in the home from 4-7 for assistance with ADLs and bed preparation. Patient's aunt [name] and husband Jimmie cares for patient and another aunt who has health concerns. They reside in the basement of the home which is set-up as full 3 bedroom apartment.   We discussed Her current illness and what it means in the larger context of Her on-going co-morbidities. With specific discussions regarding her valve replacement, worsening renal function, PEG placement and patient's overall functional and nutritional state. Natural disease trajectory and expectations at EOL were discussed.  [name] and Jimmie verbalized understanding of detailed discussion and review of current illness and  medical care. Family continues to request aggressive medical interventions with hopes that patient will show some signs of improvement and stability.  Both express although they remain hopeful if patient further declines or shows no improvement they are also preparing for the worst. They want to provide all recommended care however, they do not want her to suffer "in the long run." [name] shares that their aunt that unfortunately passed away underwent a PEG and did not do well and later passed away after realizing she was suffering. They continue to express they had to at least give an opportunity to try.   I attempted to elicit values and goals of care important to the patient.    The difference between aggressive medical intervention and comfort care was considered in light of the patient's goals of care.   Advanced directives, concepts specific to code status, and rehospitalization were considered and discussed. Patient does not  have a documented advanced directive. We discussed patient's worsening renal failure. [name] and Jimmie express they would consider any recommendations provided by the medical team as we would know what is safe for patient and would provide her with a fighting chance. They report they would not want to put her through aggressive treatments if it would not add any support or value to her quality or life or further cause complications.   I discussed patient's full code status with consideration of her current illness and co-morbidities. Family state they do not want patient to suffer however, they are undecided in regards to DNR/DNI. Jimmie states he want to allow her a fighting chance, however, in the event of heroic measures if patient's outcome is poor he would not wish to continue with interventions or long-term vent support. He states he would like to think about her code status and discuss with other family members before making a final decision. Request full code at this time until further notice.   Family express their concerns in the level of care patient will now receive. [name] is tearful expressing they can no  longer care for patient in the home due to increased medical needs. Jimmie recently was discharged from the hospital 2 days ago due to CHF and is now on oxygen. Due to their health concerns they are no longer to provide care and there is no other members of the family available to assist. [name] mentions their other aunt who also resides with them is currently being placed in an assisted living facility.   Hospice and Palliative Care services outpatient were explained and offered. Patient and family verbalized their understanding and awareness of both palliative and hospice's goals and philosophy of care. At this time they are open to outpatient palliative support at discharge. They verbalize if patient does not improve or gain some stability they would consider a more comfort/hospice support.   Questions and concerns were addressed.  Hard Choices booklet left for review. The family was encouraged to call with questions or concerns.  PMT will continue to support holistically.   SOCIAL HISTORY:     reports that she has never smoked. She has never used smokeless tobacco. She reports that she does not drink alcohol or use drugs.  CODE STATUS: Full code  ADVANCE DIRECTIVES: Jimmie Cosma (Uncle)     SYMPTOM MANAGEMENT: per attending   Palliative Prophylaxis:   Aspiration, Bowel Regimen, Delirium Protocol, Frequent Pain Assessment, Oral Care and Turn Reposition  PSYCHO-SOCIAL/SPIRITUAL:  Support System: family  Desire for further Chaplaincy support: NO   Additional  Recommendations (Limitations, Scope, Preferences):  Full Scope Treatment   PAST MEDICAL HISTORY: Past Medical History:  Diagnosis Date   Anxiety    Dizziness    Endometrial polyp 03/26/2014   Fecal occult blood test positive 03/15/2013   will send 3 cards home and refer to GI for colonoscopy   GERD (gastroesophageal reflux disease)    Hypothyroidism    Mental disorder    mentally challenged   PMB (postmenopausal  bleeding) 03/26/2014   Thickened endometrium 03/26/2014   ?polyp will get HSG    PAST SURGICAL HISTORY:  Past Surgical History:  Procedure Laterality Date   AORTIC VALVE REPLACEMENT N/A 10/07/2018   Procedure: AORTIC VALVE REPLACEMENT (AVR);  Surgeon: Lajuana Matte, MD;  Location: [place];  Service: Open Heart Surgery;  Laterality: N/A;   BIOPSY  11/03/2018   Procedure: BIOPSY;  Surgeon: Thornton Park, MD;  Location: [place];  Service: Gastroenterology;;   CATARACT EXTRACTION W/PHACO Left 08/06/2017   Procedure: CATARACT EXTRACTION PHACO AND INTRAOCULAR LENS PLACEMENT LEFT EYE;  Surgeon: Baruch Goldmann, MD;  Location: [facility];  Service: Ophthalmology;  Laterality: Left;  CDE: 12.63   CATARACT EXTRACTION W/PHACO Right 10/01/2017   Procedure: CATARACT EXTRACTION PHACO AND INTRAOCULAR LENS PLACEMENT (IOC);  Surgeon: Baruch Goldmann, MD;  Location: [facility];  Service: Ophthalmology;  Laterality: Right;  CDE: 5.77   COLONOSCOPY N/A 12/01/2013   Dr. Fields:moderate sized internal hemorrhoids/left colon is redundant   [place] N/A 04/18/2014   Procedure: DILATATION & CURETTAGE/HYSTEROSCOPY WITH NOVASURE ENDOMETRIAL ABLATION;  Surgeon: Florian Buff, MD;  Location: [facility];  Service: Gynecology;  Laterality: N/A;  Uterine Cavity Length 5cm      ESOPHAGOGASTRODUODENOSCOPY N/A 12/01/2013   NTZ:GYFV non erosive gastritis. +H.pylori    ESOPHAGOGASTRODUODENOSCOPY (EGD) WITH PROPOFOL N/A 11/03/2018   Procedure: ESOPHAGOGASTRODUODENOSCOPY (EGD) WITH PROPOFOL;  Surgeon: Thornton Park, MD;  Location: [place];  Service: Gastroenterology;  Laterality: N/A;   IR GASTROSTOMY TUBE MOD SED  11/04/2018   NO PAST SURGERIES     POLYPECTOMY N/A 04/18/2014   Procedure: ENDOMETRIAL POLYPECTOMY;  Surgeon: Florian Buff, MD;  Location: [facility];  Service: Gynecology;  Laterality: N/A;   TEE WITHOUT CARDIOVERSION N/A 10/07/2018   Procedure: TRANSESOPHAGEAL  ECHOCARDIOGRAM (TEE);  Surgeon: Lajuana Matte, MD;  Location: [place];  Service: Open Heart Surgery;  Laterality: N/A;    ALLERGIES:  has No Known Allergies.   MEDICATIONS:  Current Facility-Administered Medications  Medication Dose Route Frequency Provider Last Rate Last Dose   0.9 %  sodium chloride infusion (Manually program via Guardrails IV Fluids)   Intravenous Once Barrett, Erin R, PA-C       0.9 %  sodium chloride infusion   Intravenous PRN Lajuana Matte, MD 10 mL/hr at 11/10/18 0656 500 mL at 11/10/18 0656   acetaminophen (TYLENOL) suppository 975 mg  975 mg Rectal Q6H PRN Ivin Poot, MD   975 mg at 11/05/18 0112   amLODipine (NORVASC) tablet 5 mg  5 mg Per Tube Daily Lajuana Matte, MD   5 mg at 11/10/18 1027   bisacodyl (DULCOLAX) EC tablet 10 mg  10 mg Oral Daily PRN Melrose Nakayama, MD       Or   bisacodyl (DULCOLAX) suppository 10 mg  10 mg Rectal Daily PRN Melrose Nakayama, MD       calcium-vitamin D (OSCAL WITH D) 500-200 MG-UNIT per tablet 1 tablet  1 tablet Oral Q breakfast Lightfoot, Harrell  O, MD   1 tablet at 11/09/18 0855   ceFAZolin (ANCEF) IVPB 2g/100 mL premix  2 g Intravenous Q12H Roddenberry, Myron G, PA-C       Chlorhexidine Gluconate Cloth 2 % PADS 6 each  6 each Topical Daily Lajuana Matte, MD   6 each at 11/10/18 1027   feeding supplement (BOOST / RESOURCE BREEZE) liquid 1 Container  1 Container Oral TID BM Lajuana Matte, MD   1 Container at 11/09/18 1327   feeding supplement (OSMOLITE 1.5 CAL) liquid 1,000 mL  1,000 mL Per Tube Continuous Lajuana Matte, MD 80 mL/hr at 11/09/18 2245 1,000 mL at 11/09/18 2245   feeding supplement (PRO-STAT SUGAR FREE 64) liquid 30 mL  30 mL Per Tube TID Lajuana Matte, MD   30 mL at 11/10/18 1713   FLUoxetine (PROZAC) 20 MG/5ML solution 10 mg  10 mg Per Tube QHS Barrett, Erin R, PA-C   10 mg at 11/09/18 2246   lactulose (CHRONULAC) 10 GM/15ML solution 20 g   20 g Per Tube Daily PRN Barrett, Erin R, PA-C       levothyroxine (SYNTHROID) tablet 150 mcg  150 mcg Oral Q0600 Barrett, Erin R, PA-C   150 mcg at 11/10/18 2751   magic mouthwash  5 mL Oral TID PRN Barrett, Erin R, PA-C       MEDLINE mouth rinse  15 mL Mouth Rinse BID Lajuana Matte, MD   15 mL at 11/10/18 1027   metoCLOPramide (REGLAN) injection 10 mg  10 mg Intravenous Q6H Barrett, Erin R, PA-C   10 mg at 11/10/18 1713   metoprolol tartrate (LOPRESSOR) 25 mg/10 mL oral suspension 25 mg  25 mg Per Tube BID Barrett, Erin R, PA-C   25 mg at 11/10/18 1027   metoprolol tartrate (LOPRESSOR) injection 2.5-5 mg  2.5-5 mg Intravenous Q2H PRN Lightfoot, Lucile Crater, MD       montelukast (SINGULAIR) tablet 10 mg  10 mg Oral QHS Lightfoot, Lucile Crater, MD   10 mg at 11/08/18 2105   morphine 2 MG/ML injection 1-4 mg  1-4 mg Intravenous Q1H PRN Lajuana Matte, MD   1 mg at 11/09/18 1230   multivitamin liquid 15 mL  15 mL Oral Daily Lightfoot, Lucile Crater, MD   15 mL at 11/09/18 0937   ondansetron (ZOFRAN) injection 4 mg  4 mg Intravenous Q6H PRN Lajuana Matte, MD   4 mg at 11/09/18 1442   oxyCODONE (ROXICODONE) 5 MG/5ML solution 5-10 mg  5-10 mg Per Tube Q4H PRN Barrett, Erin R, PA-C   5 mg at 11/09/18 1116   pantoprazole (PROTONIX) injection 40 mg  40 mg Intravenous Q24H Barrett, Erin R, PA-C   40 mg at 11/10/18 1027   potassium chloride 20 MEQ/15ML (10%) solution 20 mEq  20 mEq Per Tube Daily Lars Pinks M, PA-C   20 mEq at 11/10/18 1027   rifampin (RIFADIN) capsule 300 mg  300 mg Oral Q8H Barrett, Erin R, PA-C   300 mg at 11/10/18 1711   rosuvastatin (CRESTOR) tablet 5 mg  5 mg Oral q1800 Lars Pinks M, PA-C   5 mg at 11/10/18 1713   sodium chloride flush (NS) 0.9 % injection 10-40 mL  10-40 mL Intracatheter PRN Lajuana Matte, MD   10 mL at 11/10/18 0701   sodium chloride flush (NS) 0.9 % injection 3 mL  3 mL Intravenous Q12H Lightfoot, Harrell O, MD   3  mL at  11/09/18 2256   tamsulosin (FLOMAX) capsule 0.4 mg  0.4 mg Oral Daily Nicholes Rough N, PA-C   0.4 mg at 11/09/18 4163   traMADol (ULTRAM) tablet 50 mg  50 mg Oral Q6H PRN Nani Skillern, PA-C   50 mg at 11/06/18 1816    VITAL SIGNS: BP 139/76    Pulse 85    Temp 98.8 F (37.1 C) (Oral)    Resp (!) 28    Ht _0  (1.575 m)    Wt 98.4 kg    LMP 04/16/2013 (Approximate)    SpO2 95%    BMI 39.68 kg/m  Filed Weights   11/08/18 0500 11/09/18 0424 11/10/18 0600  Weight: 96.6 kg 96 kg 98.4 kg    Estimated body mass index is 39.68 kg/m as calculated from the following:   Height as of this encounter: _1  (1.575 m).   Weight as of this encounter: 98.4 kg.  LABS: CBC:    Component Value Date/Time   WBC 8.4 11/10/2018 1455   HGB 10.4 (L) 11/10/2018 1455   HCT 33.1 (L) 11/10/2018 1455   PLT 105 (L) 11/10/2018 1455   Comprehensive Metabolic Panel:    Component Value Date/Time   NA 138 11/10/2018 0500   K 4.2 11/10/2018 0500   CO2 22 11/10/2018 0500   BUN 62 (H) 11/10/2018 0500   CREATININE 3.00 (H) 11/10/2018 0500   ALBUMIN 1.3 (L) 11/07/2018 0812     Review of Systems  Unable to perform ROS: Other  Cognitive/Developmental delay   Physical Exam General: NAD, ill-appearing Cardiovascular: regular rate and rhythm Pulmonary: diminished bases Abdomen: soft, tender stating "ouch", + bowel sounds, PEG Extremities: bilateral lower edema Neurological: Weakness, alert to self and family only   Prognosis: Unable to determine (guarded)  Discharge Planning:  To Be Determined-family unable to provide care in the home and requesting facility placement.   Recommendations:  Full Code-family in discussion regarding DNR. Expressed they would not want her to undergo heroic measures if no quality of life, no long-term intubation.   Family remains hopeful for improvement and/or stability. If no improvement or further decline despite interventions, family realistic with  requesting a transition to a more comfort/hospice approach at that time. Requesting outpatient palliative at facility (referral placed)  Jimmie and [name] no longer able to care for patient in the home due to personal physical restrictions and health concerns and the increased medical needs of patient. Requesting placement for appropriate care and safety.   PMT will continue to support and follow.    Palliative Performance Scale: PEG TUBE (PPS 30%)              Family expressed understanding and was in agreement with this plan.   Thank you for allowing the Palliative Medicine Team to assist in the care of this patient.  Time In: 1230 Time Out: 1335 Time Total: 65 min.   Visit consisted of counseling and education dealing with the complex and emotionally intense issues of symptom management and palliative care in the setting of serious and potentially life-threatening illness.Greater than 50%  of this time was spent counseling and coordinating care related to the above assessment and plan.  Signed by:  Alda Lea, AGPCNP-BC Palliative Medicine Team  Phone: [phone] Fax: [phone] Pager: [phone] Amion: Bjorn Pippin

## 2018-11-10 NOTE — Progress Notes (Addendum)
[facility] DAILY ICU PROGRESS NOTE                   [address]            [place] [address]          [phone]   7 Days Post-Op Procedure(s) (LRB): ESOPHAGOGASTRODUODENOSCOPY (EGD) WITH PROPOFOL (N/A) BIOPSY  Total Length of Stay:  LOS: 40 days   Subjective: Patient awake and alert  Objective: Vital signs in last 24 hours: Temp:  [97.9 F (36.6 C)-98.7 F (37.1 C)] 98.7 F (37.1 C) (09/10 0400) Pulse Rate:  [67-82] 78 (09/10 0600) Cardiac Rhythm: Normal sinus rhythm (09/10 0000) Resp:  [13-25] 21 (09/10 0600) BP: (104-132)/(54-74) 115/67 (09/10 0600) SpO2:  [92 %-99 %] 96 % (09/10 0600)  Filed Weights   11/07/18 0500 11/08/18 0500 11/09/18 0424  Weight: 91.2 kg 96.6 kg 96 kg   Weight change:       Intake/Output from previous day: 09/09 0701 - 09/10 0700 In: 1967.1 [I.V.:588.1; NG/GT:1000; IV Piggyback:379] Out: 3710 [Urine:1304; Drains:200]  Intake/Output this shift: No intake/output data recorded.  Current Meds: Scheduled Meds: . sodium chloride   Intravenous Once  . amLODipine  5 mg Per Tube Daily  . calcium-vitamin D  1 tablet Oral Q breakfast  . Chlorhexidine Gluconate Cloth  6 each Topical Daily  . feeding supplement  1 Container Oral TID BM  . feeding supplement (PRO-STAT SUGAR FREE 64)  30 mL Per Tube TID  . FLUoxetine  10 mg Per Tube QHS  . levothyroxine  150 mcg Oral Q0600  . mouth rinse  15 mL Mouth Rinse BID  . metoCLOPramide (REGLAN) injection  10 mg Intravenous Q6H  . metoprolol tartrate  25 mg Per Tube BID  . montelukast  10 mg Oral QHS  . multivitamin  15 mL Oral Daily  . pantoprazole (PROTONIX) IV  40 mg Intravenous Q24H  . potassium chloride  20 mEq Per Tube Daily  . rifampin  300 mg Oral Q8H  . rosuvastatin  5 mg Oral q1800  . sodium chloride flush  3 mL Intravenous Q12H  . tamsulosin  0.4 mg Oral Daily   Continuous Infusions: . sodium chloride 500 mL (11/10/18 0656)  . dextrose 5 % and 0.45% NaCl Stopped  (11/09/18 2247)  . feeding supplement (OSMOLITE 1.5 CAL) 1,000 mL (11/09/18 2245)  . piperacillin-tazobactam (ZOSYN)  IV 3.375 g (11/10/18 0700)  . vancomycin Stopped (11/09/18 1834)   PRN Meds:.sodium chloride, acetaminophen, bisacodyl **OR** bisacodyl, lactulose, magic mouthwash, metoprolol tartrate, morphine injection, ondansetron (ZOFRAN) IV, oxyCODONE, sodium chloride flush, traMADol  Heart: RRR, no murmur Lungs: Slightly diminished bibasilar breath sounds Abdomen: Semi soft, protuberant, occaisonal bowel sounds. Patient does not grimace with palpation but when asked if belly hurts, she nods yes Extremities: ++ LE edema Wound: Clean and dry  Lab Results: CBC: Recent Labs    11/08/18 0112 11/10/18 0500  WBC 7.0 7.2  HGB 8.8* 9.2*  HCT 27.4* 29.5*  PLT 93* 91*   BMET:  Recent Labs    11/09/18 0425 11/10/18 0500  NA 134* 138  K 3.8 4.2  CL 101 103  CO2 24 22  GLUCOSE 159* 146*  BUN 54* 62*  CREATININE 2.69* 3.00*  CALCIUM 8.0* 8.1*    CMET: Lab Results  Component Value Date   WBC 7.2 11/10/2018   HGB 9.2 (L) 11/10/2018   HCT 29.5 (L) 11/10/2018   PLT 91 (L) 11/10/2018   GLUCOSE  146 (H) 11/10/2018   TRIG 159 (H) 10/05/2018   ALT <5 11/07/2018   AST 22 11/07/2018   NA 138 11/10/2018   K 4.2 11/10/2018   CL 103 11/10/2018   CREATININE 3.00 (H) 11/10/2018   BUN 62 (H) 11/10/2018   CO2 22 11/10/2018   TSH 19.826 (H) 10/30/2018   INR 1.3 (H) 11/03/2018      PT/INR: No results for input(s): LABPROT, INR in the last 72 hours. Radiology: No results found.   Assessment/Plan: S/P Procedure(s) (LRB): ESOPHAGOGASTRODUODENOSCOPY (EGD) WITH PROPOFOL (N/A) BIOPSY   1. CV-SR in the 80's. On Lopressor 25 mg bid and Amlodipine 5 mg daily 2. Pulmonary-on 2 liters via [place]. 3. GI-s/p PEG 09/04. PEG is to drain this am. NPO. On nightly TFs 4. ABL anemia- H and H this am increased to 9.2 and 29.5. She received PRBCs last night. 5. ARF-Creatinine increased to 3 this  am. On IVF at 75 ml/hr. I spoke with nephrology who will see the patient. 6. Thrombocytopenia-platlelets up to 91,000 7. ID- on Vancomycin and Zosyn for possible RLL PNA. 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 BC show no growth for 24 hours. UC showed multiple species again. 8. Hypothyroidism-on Levothyroxine 150 mcg daily  Donielle Margaretann LovelessM Zimmerman PA-C 11/10/2018 7:45 AM    Agree with above Increasing creat. Nephrology consulted dispo planning  Pearle Wandler Keane Scrape Teya Otterson

## 2018-11-10 NOTE — Consult Note (Signed)
Audrey Collier Admit Date: 10/01/2018 11/10/2018 Rexene Agent Requesting Physician:  Kipp Brood MD  Reason for Consult:  AKI HPI:  58 year old female who originally presented to [facility] 8/1 with fever, nausea/vomiting and found to have bilateral pneumonia and eventual MSSA bacteremia.  Subsequent work-up identified large aortic valve vegetation/infective endocarditis.  Patient subsequently underwent aortic valve replacement on 8/7.  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  Initial antibiotic with cefazolin.  Patient's past history includes cognitive delay living in a group home.  No history of diabetes or hypertension.  Presenting serum creatinine was 1.5 and now is 3.0.  She is nonoliguric.  UA on 8/29 with hematuria and pyuria present, 2+ proteinuria, I believe she had a Foley at that time.Of note pyuria was present upon presentation as well.  No recent fevers, blood pressures have been stable.  No identified rashes.  No identified NSAID exposure.  No contrast exposure.  CT abdomen and pelvis on 9/7 with some bilateral mild pelviectasis, no overt hydronephrosis or stones.  Some perinephric stranding present.  Patient is unable to provide history.   Creatinine, Ser (mg/dL)  Date Value  11/10/2018 3.00 (H)  11/09/2018 2.69 (H)  11/08/2018 2.26 (H)  11/07/2018 2.01 (H)  11/05/2018 1.70 (H)  11/01/2018 1.65 (H)  10/29/2018 1.48 (H)  10/28/2018 1.71 (H)  10/26/2018 1.55 (H)  10/24/2018 1.56 (H)  ] I/Os: I/O last 3 completed shifts: In: 3537.8 [P.O.:60; I.V.:922.2; Other:30; NG/GT:2069.7; IV Piggyback:456] Out: 2259 [Urine:2259]   ROS NSAIDS: No exposure IV Contrast no exposure TMP/SMX no exposure Hypotension not present Balance of 12 systems is negative w/ exceptions as above  PMH  Past Medical History:  Diagnosis Date  . Anxiety   . Dizziness   . Endometrial polyp 03/26/2014  . Fecal  occult blood test positive 03/15/2013   will send 3 cards home and refer to GI for colonoscopy  . GERD (gastroesophageal reflux disease)   . Hypothyroidism   . Mental disorder    mentally challenged  . PMB (postmenopausal bleeding) 03/26/2014  . Thickened endometrium 03/26/2014   ?polyp will get HSG   PSH  Past Surgical History:  Procedure Laterality Date  . AORTIC VALVE REPLACEMENT N/A 10/07/2018   Procedure: AORTIC VALVE REPLACEMENT (AVR);  Surgeon: Lajuana Matte, MD;  Location: [place];  Service: Open Heart Surgery;  Laterality: N/A;  . BIOPSY  11/03/2018   Procedure: BIOPSY;  Surgeon: Thornton Park, MD;  Location: [facility];  Service: Gastroenterology;;  . CATARACT EXTRACTION W/PHACO Left 08/06/2017   Procedure: CATARACT EXTRACTION PHACO AND INTRAOCULAR LENS PLACEMENT LEFT EYE;  Surgeon: Baruch Goldmann, MD;  [facility];  Service: Ophthalmology;  Laterality: Left;  CDE: 12.63  . CATARACT EXTRACTION W/PHACO Right 10/01/2017   Procedure: CATARACT EXTRACTION PHACO AND INTRAOCULAR LENS PLACEMENT (IOC);  Surgeon: Baruch Goldmann, MD;  Location: [facility];  Service: Ophthalmology;  Laterality: Right;  CDE: 5.77  . COLONOSCOPY N/A 12/01/2013   Dr. Fields:moderate sized internal hemorrhoids/left colon is redundant  . DILITATION & CURRETTAGE/HYSTROSCOPY WITH NOVASURE ABLATION N/A 04/18/2014   Procedure: DILATATION & CURETTAGE/HYSTEROSCOPY WITH NOVASURE ENDOMETRIAL ABLATION;  Surgeon: Florian Buff, MD;  Location: [facility];  Service: Gynecology;  Laterality: N/A;  Uterine Cavity Length 5cm     . ESOPHAGOGASTRODUODENOSCOPY N/A 12/01/2013   DGL:OVFI non erosive gastritis. +H.pylori   . ESOPHAGOGASTRODUODENOSCOPY (EGD) WITH PROPOFOL N/A 11/03/2018   Procedure: ESOPHAGOGASTRODUODENOSCOPY (EGD) WITH PROPOFOL;  Surgeon: Tarri Glenn,  Joelene Millin, MD;  Location: [place];  Service: Gastroenterology;  Laterality: N/A;  . IR GASTROSTOMY TUBE MOD SED  11/04/2018  . NO PAST SURGERIES    . POLYPECTOMY N/A  04/18/2014   Procedure: ENDOMETRIAL POLYPECTOMY;  Surgeon: Florian Buff, MD;  Location: [facility];  Service: Gynecology;  Laterality: N/A;  . TEE WITHOUT CARDIOVERSION N/A 10/07/2018   Procedure: TRANSESOPHAGEAL ECHOCARDIOGRAM (TEE);  Surgeon: Lajuana Matte, MD;  Location: [place];  Service: Open Heart Surgery;  Laterality: N/A;   FH  Family History  Problem Relation Age of Onset  . Diabetes Maternal Aunt   . Hypertension Maternal Aunt   . Diabetes Maternal Uncle   . Colon cancer Neg Hx    SH  reports that she has never smoked. She has never used smokeless tobacco. She reports that she does not drink alcohol or use drugs. Allergies No Known Allergies Home medications Prior to Admission medications   Medication Sig Start Date End Date Taking? Authorizing Provider  Calcium Citrate-Vitamin D (CALCIUM + D PO) Take 1 tablet by mouth daily.   Yes [provider]  cetirizine (ZYRTEC) 10 MG tablet Take 10 mg by mouth daily.   Yes [provider]  diclofenac sodium (VOLTAREN) 1 % GEL Apply 1 application topically every 6 (six) hours as needed (heel pain).   Yes [provider]  docusate sodium (STOOL SOFTENER) 100 MG capsule Take 100 mg by mouth every Monday, Wednesday, and Friday.   Yes [provider]  FLUoxetine (PROZAC) 10 MG tablet Take 10 mg by mouth at bedtime.    Yes [provider]  fluticasone (FLONASE) 50 MCG/ACT nasal spray Place 1 spray into both nostrils 2 (two) times daily as needed for allergies.    Yes [provider]  levothyroxine (SYNTHROID) 125 MCG tablet Take 125 mcg by mouth daily before breakfast.    Yes [provider]  montelukast (SINGULAIR) 10 MG tablet Take 10 mg by mouth at bedtime.   Yes [provider]  Multiple Vitamin (MULTIVITAMIN) tablet Take 1 tablet by mouth daily.   Yes [provider]  omeprazole (PRILOSEC) 20 MG capsule TAKE 1 CAPSULE BY MOUTH ONCE DAILY. 07/23/15  Yes Annitta Needs, NP  rosuvastatin (CRESTOR) 5 MG tablet Take 5 mg by mouth daily.   Yes [provider]  aspirin EC 325 MG EC tablet Take 1 tablet (325 mg total) by mouth daily. 10/21/18   Nani Skillern, PA-C  ceFAZolin (ANCEF) IVPB Inject 2 g into the vein every 8 (eight) hours. Indication: MSSA endocarditis Last Day of Therapy:  11/17/18 Labs - Once weekly:  CBC/D and BMP, Labs - Every other week:  ESR and CRP 10/20/18 11/25/18  Lars Pinks M, PA-C  ferrous sulfate 325 (65 FE) MG tablet Take 1 tablet (325 mg total) by mouth daily with breakfast. For one month then stop. 10/21/18   Nani Skillern, PA-C  metolazone (ZAROXOLYN) 5 MG tablet Take 1 tablet (5 mg total) by mouth daily. 10/21/18   Nani Skillern, PA-C  metoprolol tartrate (LOPRESSOR) 25 MG tablet Take 1 tablet (25 mg total) by mouth 2 (two) times daily. 10/20/18   Nani Skillern, PA-C  potassium chloride SA (K-DUR) 20 MEQ tablet Take 1 tablet (20 mEq total) by mouth daily. 10/21/18   Nani Skillern, PA-C    Current Medications Scheduled Meds: . sodium chloride   Intravenous Once  . amLODipine  5 mg Per Tube Daily  .  calcium-vitamin D  1 tablet Oral Q breakfast  . Chlorhexidine Gluconate Cloth  6 each Topical Daily  . feeding supplement  1 Container Oral TID BM  . feeding supplement (PRO-STAT SUGAR FREE 64)  30 mL Per Tube TID  . FLUoxetine  10 mg Per Tube QHS  . levothyroxine  150 mcg Oral Q0600  . mouth rinse  15 mL Mouth Rinse BID  . metoCLOPramide (REGLAN) injection  10 mg Intravenous Q6H  . metoprolol tartrate  25 mg Per Tube BID  . montelukast  10 mg Oral QHS  . multivitamin  15 mL Oral Daily  . pantoprazole (PROTONIX) IV  40 mg Intravenous Q24H  . potassium chloride  20 mEq Per Tube Daily  . rifampin  300 mg Oral Q8H  . rosuvastatin  5 mg Oral q1800  . sodium chloride flush  3 mL Intravenous Q12H  . tamsulosin  0.4 mg Oral Daily   Continuous Infusions: . sodium chloride 500 mL  (11/10/18 0656)  . dextrose 5 % and 0.45% NaCl Stopped (11/09/18 2247)  . feeding supplement (OSMOLITE 1.5 CAL) 1,000 mL (11/09/18 2245)  . piperacillin-tazobactam (ZOSYN)  IV 3.375 g (11/10/18 0700)  . vancomycin Stopped (11/09/18 1834)   PRN Meds:.sodium chloride, acetaminophen, bisacodyl **OR** bisacodyl, lactulose, magic mouthwash, metoprolol tartrate, morphine injection, ondansetron (ZOFRAN) IV, oxyCODONE, sodium chloride flush, traMADol  CBC Recent Labs  Lab 11/07/18 0112 11/08/18 0112 11/10/18 0500  WBC 6.8 7.0 7.2  HGB 6.8* 8.8* 9.2*  HCT 22.2* 27.4* 29.5*  MCV 97.4 93.5 96.4  PLT 90* 93* 91*   Basic Metabolic Panel Recent Labs  Lab 11/05/18 0504 11/07/18 0112 11/08/18 0318 11/09/18 0425 11/10/18 0500  NA  --  136 137 134* 138  K  --  3.6 3.8 3.8 4.2  CL  --  102 102 101 103  CO2  --  24 25 24 22   GLUCOSE  --  144* 100* 159* 146*  BUN  --  44* 45* 54* 62*  CREATININE 1.70* 2.01* 2.26* 2.69* 3.00*  CALCIUM  --  8.0* 8.1* 8.0* 8.1*    Physical Exam  Blood pressure 123/68, pulse 77, temperature 97.9 F (36.6 C), temperature source Oral, resp. rate (!) 24, height 5' 2"  (1.575 m), weight 98.4 kg, last menstrual period 04/16/2013, SpO2 95 %. GEN: NAD, sitting in chair, ENT: NCAT EYES: EOMI CV: Regular, normal S1 and S2 PULM: Clear bilaterally on anterior auscultation, somewhat diminished in the bases ABD: Soft, nontender, PEG tube present SKIN: No rashes identified on limbs or trunk EXT: Anasarca, 3-4+ throughout  Assessment 53F with progressive nonoliguric renal failure status post aortic valve 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  No nephrotoxic exposures.  No clear obstruction on recent cross-sectional imaging, Foley catheter in place.  Given prolonged exposure to antibiotics and use of PPI, especially cephalosporins, and nonoliguric renal failure with some pyuria present, I wonder if  this patient has AIN.  Of note there is no rash identified.  1. AKI, nonoliguric, presenting creatinine 1.5, now 3.0.  Unclear etiology, question AIN related to antibiotics or PPI 2. Status post AVR on 8/7 for MSSA infective endocarditis 3. Protein calorie malnutrition, severe hypoalbuminemia; PEG present on enteral nutrition 4. Pyuria 5. Cognitive delay 6. Anemia, thrombocytopenia, mild 7. Hypothyroidism  Plan 1. Repeat UA, check UPC 2. Check C3 and C4, antistreptolysin O 3. Add differential to CBC 4. Stop PPI, if permissive per primary service 5.  If AIN seems more likely, consider changing off of current antibiotics, acknowledging this might be difficult 6. If renal failure progresses, need to consider renal biopsy 7. Daily weights, Daily Renal Panel, Strict I/Os, Avoid nephrotoxins (NSAIDs, judicious IV Contrast)    Rexene Agent  [phone] pgr 11/10/2018, 9:51 AM

## 2018-11-11 ENCOUNTER — Ambulatory Visit: Payer: Medicaid Other | Admitting: Thoracic Surgery (Cardiothoracic Vascular Surgery)

## 2018-11-11 LAB — RENAL FUNCTION PANEL
Albumin: 1.6 g/dL — ABNORMAL LOW (ref 3.5–5.0)
Anion gap: 12 (ref 5–15)
BUN: 69 mg/dL — ABNORMAL HIGH (ref 6–20)
CO2: 23 mmol/L (ref 22–32)
Calcium: 8.4 mg/dL — ABNORMAL LOW (ref 8.9–10.3)
Chloride: 105 mmol/L (ref 98–111)
Creatinine, Ser: 3.46 mg/dL — ABNORMAL HIGH (ref 0.44–1.00)
GFR calc Af Amer: 16 mL/min — ABNORMAL LOW (ref >60)
GFR calc non Af Amer: 14 mL/min — ABNORMAL LOW (ref >60)
Glucose, Bld: 142 mg/dL — ABNORMAL HIGH (ref 70–99)
Phosphorus: 5.9 mg/dL — ABNORMAL HIGH (ref 2.5–4.6)
Potassium: 4.4 mmol/L (ref 3.5–5.1)
Sodium: 140 mmol/L (ref 135–145)

## 2018-11-11 LAB — C4 COMPLEMENT: Complement C4, Body Fluid: 22 mg/dL (ref 14–44)

## 2018-11-11 LAB — ANTISTREPTOLYSIN O TITER: ASO: 524 IU/mL — ABNORMAL HIGH (ref 0.0–200.0)

## 2018-11-11 LAB — GLUCOSE, CAPILLARY
Glucose-Capillary: 132 mg/dL — ABNORMAL HIGH (ref 70–99)
Glucose-Capillary: 138 mg/dL — ABNORMAL HIGH (ref 70–99)
Glucose-Capillary: 148 mg/dL — ABNORMAL HIGH (ref 70–99)

## 2018-11-11 LAB — C3 COMPLEMENT: C3 Complement: 129 mg/dL (ref 82–167)

## 2018-11-11 MED ORDER — METOCLOPRAMIDE HCL 5 MG/ML IJ SOLN
5.0000 mg | Freq: Two times a day (BID) | INTRAMUSCULAR | Status: DC
  Administered 2018-11-12 – 2018-11-20 (×18): 5 mg via INTRAVENOUS
  Filled 2018-11-11 (×19): qty 2

## 2018-11-11 NOTE — Progress Notes (Signed)
[facility]    NEPHROLOGY PROGRESS NOTE  SUBJECTIVE: Patient complaining of nausea and vomiting.  Denies fevers, chills, chest pain, shortness of breath, skin rash, or myalgias.  All other review of systems are negative.    OBJECTIVE:  Vitals:   11/11/18 1200 11/11/18 1300  BP: 103/76   Pulse: 75 78  Resp: (!) 22 (!) 23  Temp:    SpO2: 96% 95%    Intake/Output Summary (Last 24 hours) at 11/11/2018 1430 Last data filed at 11/11/2018 1200 Gross per 24 hour  Intake 1072.87 ml  Output 1050 ml  Net 22.87 ml      General: Alert to self, NAD HEENT: MMM [place] AT anicteric sclera Neck:  No JVD, no adenopathy CV:  Heart RRR  Lungs:  L/S CTA bilaterally Abd:  abd slightly distended, nontender with normal BS GU:  Bladder non-palpable Extremities: +3 bilateral lower extremity edema. Skin:  No skin rash  MEDICATIONS:  . sodium chloride   Intravenous Once  . amLODipine  5 mg Per Tube Daily  . calcium-vitamin D  1 tablet Oral Q breakfast  . Chlorhexidine Gluconate Cloth  6 each Topical Daily  . feeding supplement  1 Container Oral TID BM  . feeding supplement (PRO-STAT SUGAR FREE 64)  30 mL Per Tube TID  . FLUoxetine  10 mg Per Tube QHS  . levothyroxine  150 mcg Oral Q0600  . mouth rinse  15 mL Mouth Rinse BID  . metoCLOPramide (REGLAN) injection  10 mg Intravenous Q6H  . metoprolol tartrate  25 mg Per Tube BID  . montelukast  10 mg Oral QHS  . multivitamin  15 mL Oral Daily  . pantoprazole (PROTONIX) IV  40 mg Intravenous Q24H  . potassium chloride  20 mEq Per Tube Daily  . rifampin  300 mg Oral Q8H  . rosuvastatin  5 mg Oral q1800  . sodium chloride flush  3 mL Intravenous Q12H  . tamsulosin  0.4 mg Oral Daily       LABS:   CBC Latest Ref Rng & Units 11/10/2018 11/10/2018 11/08/2018  WBC 4.0 - 10.5 K/uL 8.4 7.2 7.0  Hemoglobin 12.0 - 15.0 g/dL 10.4(L) 9.2(L) 8.8(L)  Hematocrit 36.0 - 46.0 % 33.1(L) 29.5(L) 27.4(L)  Platelets 150 - 400 K/uL 105(L) 91(L)  93(L)    CMP Latest Ref Rng & Units 11/11/2018 11/10/2018 11/09/2018  Glucose 70 - 99 mg/dL 161(W142(H) 960(A146(H) 540(J159(H)  BUN 6 - 20 mg/dL 81(X69(H) 91(Y62(H) 78(G54(H)  Creatinine 0.44 - 1.00 mg/dL 9.56(O3.46(H) 1.30(Q3.00(H) 6.57(Q2.69(H)  Sodium 135 - 145 mmol/L 140 138 134(L)  Potassium 3.5 - 5.1 mmol/L 4.4 4.2 3.8  Chloride 98 - 111 mmol/L 105 103 101  CO2 22 - 32 mmol/L 23 22 24   Calcium 8.9 - 10.3 mg/dL 4.6(N8.4(L) 8.1(L) 8.0(L)  Total Protein 6.5 - 8.1 g/dL - - -  Total Bilirubin 0.3 - 1.2 mg/dL - - -  Alkaline Phos 38 - 126 U/L - - -  AST 15 - 41 U/L - - -  ALT 0 - 44 U/L - - -    Lab Results  Component Value Date   CALCIUM 8.4 (L) 11/11/2018   CAION 1.19 10/08/2018   PHOS 5.9 (H) 11/11/2018       Component Value Date/Time   COLORURINE AMBER (A) 10/29/2018 1408   APPEARANCEUR CLEAR 10/29/2018 1408   LABSPEC 1.014 10/29/2018 1408   PHURINE 6.0 10/29/2018 1408   GLUCOSEU NEGATIVE 10/29/2018 1408   HGBUR SMALL (A) 10/29/2018  [place] 10/29/2018 1408   KETONESUR NEGATIVE 10/29/2018 1408   PROTEINUR 100 (A) 10/29/2018 1408   UROBILINOGEN 0.2 04/13/2014 1030   NITRITE NEGATIVE 10/29/2018 1408   LEUKOCYTESUR TRACE (A) 10/29/2018 1408      Component Value Date/Time   PHART 7.430 10/08/2018 0920   PCO2ART 35.6 10/08/2018 0920   PO2ART 70.0 (L) 10/08/2018 0920   HCO3 23.6 10/08/2018 0920   TCO2 25 10/08/2018 0920   ACIDBASEDEF 6.0 (H) 10/08/2018 0307   O2SAT 94.0 10/08/2018 0920       Component Value Date/Time   IRON 107 06/21/2015 1131   TIBC 309 06/21/2015 1131   FERRITIN 220 10/02/2018 0809   IRONPCTSAT 35 06/21/2015 1131       ASSESSMENT/PLAN:    3F with progressive nonoliguric renal failure status post aortic valve 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   1.  Acute kidney injury.  Remains nonoliguric.  Creatinine now over 3.  Urinalysis most consistent with AIN.  No fever or rash identified.  Creatinine  continues to uptrend.  ASO titer positive, but complements normal arguing against postinfectious glomerulonephritis.  Okay to remove Foley.  No evidence of obstruction on ultrasound.  Would consider discontinuation of PPI, and would prefer changing rifampin to an alternative agent.  It is not clear that the timeframe of the increase in creatinine is consistent with the introduction of both of these medications, however, they are the most likely medications on the list to be causing allergic interstitial nephritis.  Ancef is also a possibility but is less likely given the time course.  2.  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  3.  Severe hypoalbuminemia.  Continue enteral nutrition.  4.  Cognitive delay.  5.  Anemia.  Hemoglobin is up trending.  Continue to monitor.          [place], DO, [place]

## 2018-11-11 NOTE — TOC Progression Note (Signed)
Transition of Care [facility]) - Progression Note    Patient Details  Name: Audrey Collier MRN: [id number] Date of Birth: Oct 06, 1960  Transition of Care [facility]) CM/SW [place], [place] Phone Number: 11/11/2018, 6:10 PM  Clinical Narrative:     CSW called and spoke with the patient's aunt, Darlyne Schmiesing. After speaking with PMT yesterday, the family would like to start looking for a SNF placement. They would like something close to home. CSW stated that she could not promise something close to home, CSW explained that per medicaid, it is up to the facility. CSW stated she would try to honor her request but could not guarantee that a placement would be close to home.   CSW obtained permission to fax the patient out. Currently the patient does not have any bed offers.   CSW will continue to follow.   Expected Discharge Plan: [place] Barriers to Discharge: No SNF bed, Continued Medical Work up  Expected Discharge Plan and Services Expected Discharge Plan: [place] In-house Referral: Clinical Social Work Discharge Planning Services: NA Post Acute Care Choice: [place] Living arrangements for the past 2 months: Single Family Home                 DME Arranged: N/A DME Agency: NA                   Social Determinants of Health (SDOH) Interventions    Readmission Risk Interventions No flowsheet data found.

## 2018-11-11 NOTE — NC FL2 (Addendum)
[place] MEDICAID FL2 LEVEL OF CARE SCREENING TOOL     IDENTIFICATION  Patient Name: Audrey Collier Birthdate: 27-Jan-1961 Sex: female Admission Date (Current Location): 10/01/2018  [place] and [place] Number:  Haynes Bast [id number] Facility and Address:  [facility] [place], [address] [place], [place] [address]      Provider Number: [id number]  Attending Physician Name and Address:  Corliss Skains, MD  Relative Name and Phone Number:  [facility] [place], [place], [phone]    Current Level of Care: Hospital Recommended Level of Care: Skilled Nursing Facility Prior Approval Number:    Date Approved/Denied:   PASRR Number: [id number]  Discharge Plan: SNF    Current Diagnoses: Patient Active Problem List   Diagnosis Date Noted  . Palliative care by specialist   . Pressure injury of skin 10/10/2018  . S/P AVR (aortic valve replacement) 10/07/2018  . Acute respiratory failure with hypoxia ([name])   . Staphylococcus aureus bacteremia with sepsis ([name]) 10/03/2018  . Bacteremia due to methicillin susceptible Staphylococcus aureus (MSSA)   . Aortic valve endocarditis   . Multifocal pneumonia   . Acute hypoxemic respiratory failure ([name])   . Severe sepsis ([name]) 10/01/2018  . GERD (gastroesophageal reflux disease) 03/18/2017  . Loose stools 09/30/2015  . Abdominal pain 11/30/2014  . Helicobacter pylori gastritis 10/24/2014  . PMB (postmenopausal bleeding) 03/26/2014  . Thickened endometrium 03/26/2014  . Endometrial polyp 03/26/2014  . Constipation 04/11/2013  . Fecal occult blood test positive 03/15/2013    Orientation RESPIRATION BLADDER Height & Weight     Self  O2(96, [place], 2L) Continent, External catheter Weight: 215 lb 2.7 oz (97.6 kg) Height:  5\' 2"  (157.5 cm)  BEHAVIORAL SYMPTOMS/MOOD NEUROLOGICAL BOWEL NUTRITION STATUS      Incontinent Feeding tube(osmolite 1.2 CAL intermitten; clear liquid diet)  AMBULATORY STATUS  COMMUNICATION OF NEEDS Skin   Extensive Assist Verbally Bruising, Skin abrasions, Surgical wounds(abrasion on right ear, bruising on arm/abdomen, MASD on groin, skin tear on arm, pressure injury on buttocks (change dressing every 3 days), and close incision on chest (skin glue))                       Personal Care Assistance Level of Assistance  Bathing, Feeding, Dressing, Total care Bathing Assistance: Maximum assistance Feeding assistance: Maximum assistance Dressing Assistance: Maximum assistance Total Care Assistance: Maximum assistance   Functional Limitations Info  Sight, Hearing, Speech Sight Info: Adequate Hearing Info: Adequate Speech Info: Adequate    SPECIAL CARE FACTORS FREQUENCY  PT (By licensed PT), OT (By licensed OT)     PT Frequency: 5x/wk OT Frequency: 5x/wk            Contractures Contractures Info: Not present    Additional Factors Info  Code Status, Psychotropic, Allergies Code Status Info: Full Code Allergies Info: No Known Allergies Psychotropic Info: prozac 48ml daily at bedtime         Current Medications (11/11/2018):  This is the current hospital active medication list Current Facility-Administered Medications  Medication Dose Route Frequency Provider Last Rate Last Dose  . 0.9 %  sodium chloride infusion (Manually program via Guardrails IV Fluids)   Intravenous Once Barrett, Erin R, PA-C      . 0.9 %  sodium chloride infusion   Intravenous PRN Corliss Skains, MD 10 mL/hr at 11/11/18 1200    . acetaminophen (TYLENOL) suppository 975 mg  975 mg Rectal Q6H PRN Kerin Perna,  MD   975 mg at 11/05/18 0112  . amLODipine (NORVASC) tablet 5 mg  5 mg Per Tube Daily Corliss SkainsLightfoot, Harrell O, MD   5 mg at 11/11/18 0931  . bisacodyl (DULCOLAX) EC tablet 10 mg  10 mg Oral Daily PRN Loreli SlotHendrickson, Steven C, MD       Or  . bisacodyl (DULCOLAX) suppository 10 mg  10 mg Rectal Daily PRN Loreli SlotHendrickson, Steven C, MD      . calcium-vitamin D (OSCAL WITH  D) 500-200 MG-UNIT per tablet 1 tablet  1 tablet Oral Q breakfast Corliss SkainsLightfoot, Harrell O, MD   1 tablet at 11/11/18 0921  . ceFAZolin (ANCEF) IVPB 2g/100 mL premix  2 g Intravenous Q12H Roddenberry, Cecille AmsterdamMyron G, PA-C   Stopped at 11/11/18 1000  . Chlorhexidine Gluconate Cloth 2 % PADS 6 each  6 each Topical Daily Corliss SkainsLightfoot, Harrell O, MD   6 each at 11/11/18 1244  . feeding supplement (BOOST / RESOURCE BREEZE) liquid 1 Container  1 Container Oral TID BM Corliss SkainsLightfoot, Harrell O, MD   1 Container at 11/09/18 1327  . feeding supplement (OSMOLITE 1.5 CAL) liquid 1,000 mL  1,000 mL Per Tube Continuous Corliss SkainsLightfoot, Harrell O, MD   Stopped at 11/11/18 1400  . feeding supplement (PRO-STAT SUGAR FREE 64) liquid 30 mL  30 mL Per Tube TID Corliss SkainsLightfoot, Harrell O, MD   30 mL at 11/11/18 1500  . FLUoxetine (PROZAC) 20 MG/5ML solution 10 mg  10 mg Per Tube QHS Barrett, Erin R, PA-C   10 mg at 11/10/18 2116  . lactulose (CHRONULAC) 10 GM/15ML solution 20 g  20 g Per Tube Daily PRN Barrett, Erin R, PA-C      . levothyroxine (SYNTHROID) tablet 150 mcg  150 mcg Oral Q0600 Barrett, Erin R, PA-C   150 mcg at 11/11/18 0621  . magic mouthwash  5 mL Oral TID PRN Barrett, Erin R, PA-C      . MEDLINE mouth rinse  15 mL Mouth Rinse BID Corliss SkainsLightfoot, Harrell O, MD   15 mL at 11/11/18 0932  . [START ON 11/12/2018] metoCLOPramide (REGLAN) injection 5 mg  5 mg Intravenous Q12H Alleen BorneBartle, Bryan K, MD      . metoprolol tartrate (LOPRESSOR) 25 mg/10 mL oral suspension 25 mg  25 mg Per Tube BID Barrett, Erin R, PA-C   25 mg at 11/11/18 0931  . metoprolol tartrate (LOPRESSOR) injection 2.5-5 mg  2.5-5 mg Intravenous Q2H PRN Lightfoot, Harrell O, MD      . montelukast (SINGULAIR) tablet 10 mg  10 mg Oral QHS Corliss SkainsLightfoot, Harrell O, MD   10 mg at 11/10/18 2117  . morphine 2 MG/ML injection 1-4 mg  1-4 mg Intravenous Q1H PRN Corliss SkainsLightfoot, Harrell O, MD   1 mg at 11/09/18 1230  . multivitamin liquid 15 mL  15 mL Oral Daily Corliss SkainsLightfoot, Harrell O, MD   15 mL at  11/11/18 0931  . ondansetron (ZOFRAN) injection 4 mg  4 mg Intravenous Q6H PRN Corliss SkainsLightfoot, Harrell O, MD   4 mg at 11/11/18 1219  . oxyCODONE (ROXICODONE) 5 MG/5ML solution 5-10 mg  5-10 mg Per Tube Q4H PRN Barrett, Erin R, PA-C   5 mg at 11/09/18 1116  . rifampin (RIFADIN) capsule 300 mg  300 mg Oral Q8H Barrett, Erin R, PA-C   300 mg at 11/11/18 1401  . rosuvastatin (CRESTOR) tablet 5 mg  5 mg Oral q1800 Doree FudgeZimmerman, Donielle M, PA-C   5 mg at 11/11/18 1704  . sodium chloride flush (  NS) 0.9 % injection 10-40 mL  10-40 mL Intracatheter PRN Lajuana Matte, MD   10 mL at 11/10/18 0701  . sodium chloride flush (NS) 0.9 % injection 3 mL  3 mL Intravenous Q12H Lajuana Matte, MD   3 mL at 11/11/18 0920  . tamsulosin (FLOMAX) capsule 0.4 mg  0.4 mg Oral Daily Nicholes Rough N, PA-C   0.4 mg at 11/11/18 0931  . traMADol (ULTRAM) tablet 50 mg  50 mg Oral Q6H PRN Lars Pinks M, PA-C   50 mg at 11/06/18 1816     Discharge Medications: Please see discharge summary for a list of discharge medications.  Relevant Imaging Results:  Relevant Lab Results:   Additional Information SSN: [id number]; Short-term Medicaid bed, could turn into LTC  Tyrianna Lightle B Signa Cheek, LCSWA

## 2018-11-11 NOTE — Progress Notes (Signed)
Patient ID: Audrey Collier, female   DOB: 03/26/1960, 58 y.o.   MRN: [id number] TCTS Evening Rounds:  Hemodynamically stable in sinus rhythm.  sats 96% on 2L  Non-oliguric renal failure with rising creat but good urine output. Seen by nephrology who recommends stopping PPI and Rifampin if possible. Will stop PPI but discuss Rifampin with Dr. Kipp Brood.

## 2018-11-11 NOTE — Progress Notes (Addendum)
TCTS DAILY ICU PROGRESS NOTE                   [address]            [place] [id number]          [phone]   8 Days Post-Op Procedure(s) (LRB): ESOPHAGOGASTRODUODENOSCOPY (EGD) WITH PROPOFOL (N/A) BIOPSY  Total Length of Stay:  LOS: 41 days   Subjective: Patient sleeping and awakened.  Objective: Vital signs in last 24 hours: Temp:  [97.6 F (36.4 C)-98.8 F (37.1 C)] 97.8 F (36.6 C) (09/11 0355) Pulse Rate:  [67-85] 77 (09/11 0730) Cardiac Rhythm: Normal sinus rhythm (09/11 0730) Resp:  [17-28] 18 (09/11 0730) BP: (115-144)/(54-109) 121/109 (09/11 0700) SpO2:  [94 %-98 %] 96 % (09/11 0730) Weight:  [97.6 kg] 97.6 kg (09/11 0500)  Filed Weights   11/09/18 0424 11/10/18 0600 11/11/18 0500  Weight: 96 kg 98.4 kg 97.6 kg   Weight change: -0.8 kg      Intake/Output from previous day: 09/10 0701 - 09/11 0700 In: 862.9 [I.V.:408.2; NG/GT:354.7; IV Piggyback:100] Out: 725 [Urine:725]  Intake/Output this shift: No intake/output data recorded.  Current Meds: Scheduled Meds: . sodium chloride   Intravenous Once  . amLODipine  5 mg Per Tube Daily  . calcium-vitamin D  1 tablet Oral Q breakfast  . Chlorhexidine Gluconate Cloth  6 each Topical Daily  . feeding supplement  1 Container Oral TID BM  . feeding supplement (PRO-STAT SUGAR FREE 64)  30 mL Per Tube TID  . FLUoxetine  10 mg Per Tube QHS  . levothyroxine  150 mcg Oral Q0600  . mouth rinse  15 mL Mouth Rinse BID  . metoCLOPramide (REGLAN) injection  10 mg Intravenous Q6H  . metoprolol tartrate  25 mg Per Tube BID  . montelukast  10 mg Oral QHS  . multivitamin  15 mL Oral Daily  . pantoprazole (PROTONIX) IV  40 mg Intravenous Q24H  . potassium chloride  20 mEq Per Tube Daily  . rifampin  300 mg Oral Q8H  . rosuvastatin  5 mg Oral q1800  . sodium chloride flush  3 mL Intravenous Q12H  . tamsulosin  0.4 mg Oral Daily   Continuous Infusions: . sodium chloride Stopped (11/11/18 0207)  .   ceFAZolin (ANCEF) IV Stopped (11/10/18 2135)  . feeding supplement (OSMOLITE 1.5 CAL) 1,000 mL (11/11/18 0234)   PRN Meds:.sodium chloride, acetaminophen, bisacodyl **OR** bisacodyl, lactulose, magic mouthwash, metoprolol tartrate, morphine injection, ondansetron (ZOFRAN) IV, oxyCODONE, sodium chloride flush, traMADol  Heart: RRR, no murmur Lungs: Slightly diminished bibasilar breath sounds Abdomen: Semi soft, protuberant, high pitched bowel sounds. Patient does not grimace with palpation and when asked if belly hurts, she nods no Extremities: ++ LE edema Wound: Clean and dry  Lab Results: CBC: Recent Labs    11/10/18 0500 11/10/18 1455  WBC 7.2 8.4  HGB 9.2* 10.4*  HCT 29.5* 33.1*  PLT 91* 105*   BMET:  Recent Labs    11/10/18 0500 11/11/18 0616  NA 138 140  K 4.2 4.4  CL 103 105  CO2 22 23  GLUCOSE 146* 142*  BUN 62* 69*  CREATININE 3.00* 3.46*  CALCIUM 8.1* 8.4*    CMET: Lab Results  Component Value Date   WBC 8.4 11/10/2018   HGB 10.4 (L) 11/10/2018   HCT 33.1 (L) 11/10/2018   PLT 105 (L) 11/10/2018   GLUCOSE 142 (H) 11/11/2018   TRIG 159 (H) 10/05/2018  ALT <5 11/07/2018   AST 22 11/07/2018   NA 140 11/11/2018   K 4.4 11/11/2018   CL 105 11/11/2018   CREATININE 3.46 (H) 11/11/2018   BUN 69 (H) 11/11/2018   CO2 23 11/11/2018   TSH 19.826 (H) 10/30/2018   INR 1.3 (H) 11/03/2018      PT/INR: No results for input(s): LABPROT, INR in the last 72 hours. Radiology: No results found.   Assessment/Plan: S/P Procedure(s) (LRB): ESOPHAGOGASTRODUODENOSCOPY (EGD) WITH PROPOFOL (N/A) BIOPSY   1. CV-SR in the 70-80's. On Lopressor 25 mg bid and Amlodipine 5 mg daily 2. Pulmonary-on 2 liters via [place]. 3. GI-s/p PEG 09/04. PEG is to drain this am. NPO. On nightly TFs 4. ABL anemia- H and H this am increased to 10.4 and 33.1. She received transfusion several days ago. 5. ARF-Creatinine increased to 3.46 this am. Her creatinine upon admission was 1.52.  Appreciate nephrology's assistance. 6. Thrombocytopenia-platlelets yesterday up to 105,000 7. ID- She is now back on Cefazolin and Vancomycin and Zosyn have been stopped. She has been  on Rifampin for MSSA endocarditis. BC show no growth. UA ordered. 8. Hypothyroidism-on Levothyroxine 150 mcg daily  Donielle Liston Alba PA-C 11/11/2018 8:14 AM   Agree with above Continue to follow creatinine Family unable to care for patient at home [facility].

## 2018-11-11 NOTE — Progress Notes (Signed)
CSW called and left a message with the patient's aunt, Sakinah Rosamond to complete assessment. CSW is awaiting a return phone call.   CSW will continue to follow.   Domenic Schwab, MSW, [place] Worker [facility]  [phone]

## 2018-11-12 LAB — CULTURE, BLOOD (ROUTINE X 2)
Culture: NO GROWTH
Culture: NO GROWTH
Special Requests: ADEQUATE
Special Requests: ADEQUATE

## 2018-11-12 LAB — GLUCOSE, CAPILLARY
Glucose-Capillary: 124 mg/dL — ABNORMAL HIGH (ref 70–99)
Glucose-Capillary: 100 mg/dL — ABNORMAL HIGH (ref 70–99)

## 2018-11-12 LAB — URINALYSIS, ROUTINE W REFLEX MICROSCOPIC
Bilirubin Urine: NEGATIVE
Glucose, UA: NEGATIVE mg/dL
Ketones, ur: NEGATIVE mg/dL
Nitrite: NEGATIVE
Protein, ur: 100 mg/dL — AB
Specific Gravity, Urine: 1.01 (ref 1.005–1.030)
pH: 6 (ref 5.0–8.0)

## 2018-11-12 LAB — RENAL FUNCTION PANEL
Albumin: 1.6 g/dL — ABNORMAL LOW (ref 3.5–5.0)
Anion gap: 12 (ref 5–15)
BUN: 73 mg/dL — ABNORMAL HIGH (ref 6–20)
CO2: 22 mmol/L (ref 22–32)
Calcium: 8.5 mg/dL — ABNORMAL LOW (ref 8.9–10.3)
Chloride: 106 mmol/L (ref 98–111)
Creatinine, Ser: 3.24 mg/dL — ABNORMAL HIGH (ref 0.44–1.00)
GFR calc Af Amer: 17 mL/min — ABNORMAL LOW (ref >60)
GFR calc non Af Amer: 15 mL/min — ABNORMAL LOW (ref >60)
Glucose, Bld: 104 mg/dL — ABNORMAL HIGH (ref 70–99)
Phosphorus: 6.5 mg/dL — ABNORMAL HIGH (ref 2.5–4.6)
Potassium: 4.9 mmol/L (ref 3.5–5.1)
Sodium: 140 mmol/L (ref 135–145)

## 2018-11-12 NOTE — Progress Notes (Signed)
Patient ID: Audrey Collier, female   DOB: 03/14/1960, 58 y.o.   MRN: [id number] TCTS Evening Rounds:  Hemodynamically stable in sinus rhythm.  sats 95%  Taking some po during the day and TF at night.  Making urine.  No new problems today.

## 2018-11-12 NOTE — Progress Notes (Signed)
[facility]    NEPHROLOGY PROGRESS NOTE  SUBJECTIVE: Patient resting on exam.  Denies fevers, chills, chest pain, shortness of breath, skin rash, or myalgias.  All other review of systems are negative.   OBJECTIVE:  Vitals:   11/12/18 0700 11/12/18 0815  BP: 124/72   Pulse: 78   Resp: 16   Temp:  98.7 F (37.1 C)  SpO2: 95%     Intake/Output Summary (Last 24 hours) at 11/12/2018 1021 Last data filed at 11/12/2018 0700 Gross per 24 hour  Intake 2347.62 ml  Output 800 ml  Net 1547.62 ml      General: Alert to self, NAD HEENT: MMM [place] AT anicteric sclera Neck:  No JVD, no adenopathy CV:  Heart RRR  Lungs:  L/S CTA bilaterally Abd:  abd slightly distended, nontender with normal BS GU:  Bladder non-palpable Extremities: +3 bilateral lower extremity edema. Skin:  No skin rash  MEDICATIONS:  . sodium chloride   Intravenous Once  . amLODipine  5 mg Per Tube Daily  . calcium-vitamin D  1 tablet Oral Q breakfast  . Chlorhexidine Gluconate Cloth  6 each Topical Daily  . feeding supplement  1 Container Oral TID BM  . feeding supplement (PRO-STAT SUGAR FREE 64)  30 mL Per Tube TID  . FLUoxetine  10 mg Per Tube QHS  . levothyroxine  150 mcg Oral Q0600  . mouth rinse  15 mL Mouth Rinse BID  . metoCLOPramide (REGLAN) injection  5 mg Intravenous Q12H  . metoprolol tartrate  25 mg Per Tube BID  . montelukast  10 mg Oral QHS  . multivitamin  15 mL Oral Daily  . rifampin  300 mg Oral Q8H  . rosuvastatin  5 mg Oral q1800  . sodium chloride flush  3 mL Intravenous Q12H  . tamsulosin  0.4 mg Oral Daily       LABS:   CBC Latest Ref Rng & Units 11/10/2018 11/10/2018 11/08/2018  WBC 4.0 - 10.5 K/uL 8.4 7.2 7.0  Hemoglobin 12.0 - 15.0 g/dL 10.4(L) 9.2(L) 8.8(L)  Hematocrit 36.0 - 46.0 % 33.1(L) 29.5(L) 27.4(L)  Platelets 150 - 400 K/uL 105(L) 91(L) 93(L)    CMP Latest Ref Rng & Units 11/12/2018 11/11/2018 11/10/2018  Glucose 70 - 99 mg/dL 161(W104(H) 960(A142(H) 540(J146(H)  BUN 6  - 20 mg/dL 81(X73(H) 91(Y69(H) 78(G62(H)  Creatinine 0.44 - 1.00 mg/dL 9.56(O3.24(H) 1.30(Q3.46(H) 6.57(Q3.00(H)  Sodium 135 - 145 mmol/L 140 140 138  Potassium 3.5 - 5.1 mmol/L 4.9 4.4 4.2  Chloride 98 - 111 mmol/L 106 105 103  CO2 22 - 32 mmol/L 22 23 22   Calcium 8.9 - 10.3 mg/dL 4.6(N8.5(L) 6.2(X8.4(L) 8.1(L)  Total Protein 6.5 - 8.1 g/dL - - -  Total Bilirubin 0.3 - 1.2 mg/dL - - -  Alkaline Phos 38 - 126 U/L - - -  AST 15 - 41 U/L - - -  ALT 0 - 44 U/L - - -    Lab Results  Component Value Date   CALCIUM 8.5 (L) 11/12/2018   CAION 1.19 10/08/2018   PHOS 6.5 (H) 11/12/2018       Component Value Date/Time   COLORURINE AMBER (A) 10/29/2018 1408   APPEARANCEUR CLEAR 10/29/2018 1408   LABSPEC 1.014 10/29/2018 1408   PHURINE 6.0 10/29/2018 1408   GLUCOSEU NEGATIVE 10/29/2018 1408   HGBUR SMALL (A) 10/29/2018 1408   BILIRUBINUR NEGATIVE 10/29/2018 1408   KETONESUR NEGATIVE 10/29/2018 1408   PROTEINUR 100 (A) 10/29/2018 1408  UROBILINOGEN 0.2 04/13/2014 1030   NITRITE NEGATIVE 10/29/2018 1408   LEUKOCYTESUR TRACE (A) 10/29/2018 1408      Component Value Date/Time   PHART 7.430 10/08/2018 0920   PCO2ART 35.6 10/08/2018 0920   PO2ART 70.0 (L) 10/08/2018 0920   HCO3 23.6 10/08/2018 0920   TCO2 25 10/08/2018 0920   ACIDBASEDEF 6.0 (H) 10/08/2018 0307   O2SAT 94.0 10/08/2018 0920       Component Value Date/Time   IRON 107 06/21/2015 1131   TIBC 309 06/21/2015 1131   FERRITIN 220 10/02/2018 0809   IRONPCTSAT 35 06/21/2015 1131       ASSESSMENT/PLAN:    38F with progressive nonoliguric renal failure status post aortic valve 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   1.  Acute kidney injury.  Remains nonoliguric.  Creatinine slightly improved today.  Urinalysis most consistent with AIN.  No fever or rash identified.  Creatinine continues to uptrend.  ASO titer positive, but complements normal arguing against postinfectious  glomerulonephritis.  No evidence of obstruction on ultrasound.  PPI D/C'ed.  Would prefer changing rifampin to an alternative agent, but could hold off and trend creatinine over the next day or two.  Urine eos pending.  It is not clear that the timeframe of the increase in creatinine is consistent with the introduction of this medication, however, it is the most likely medication on the list to be causing allergic interstitial nephritis.  Ancef is also a possibility but is less likely given the time course.  Repeat UA.  May need renal biopsy if no improvement.  2.  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  3.  Severe hypoalbuminemia.  Continue enteral nutrition.  4.  Cognitive delay.  5.  Anemia.  Hemoglobin is up trending.  Continue to monitor.          [place], DO, [place]

## 2018-11-12 NOTE — Progress Notes (Signed)
Rested well overnight. Remains with poor appetite but did drink water and a full Boost Breeze (likes orange flavour) last night. On nocturnal tube feeds.   Emesis and diarrhea x 1. PEG tube with small amount of thick/blood tinged drainage, site care done. Abdomen remains distended but +/hypoactive bowel sounds.   Foley removed yesterday, voiding with purewick external catheter. Cr trending down 3.46 ->3.24  Strength improving, assists with turning in bed. Encouraging ROM, elevating extremities d/t edema.   Followed by Case Management who is working on SNF placement when pt meets criteria for d/c. Family updated by phone last night.

## 2018-11-12 NOTE — Plan of Care (Signed)
  Problem: Clinical Measurements: Goal: Ability to maintain clinical measurements within normal limits will improve Outcome: Progressing Goal: Will remain free from infection Outcome: Progressing Goal: Cardiovascular complication will be avoided Outcome: Progressing   Problem: Coping: Goal: Level of anxiety will decrease Outcome: Progressing   Problem: Safety: Goal: Ability to remain free from injury will improve Outcome: Progressing   Problem: Skin Integrity: Goal: Risk for impaired skin integrity will decrease Outcome: Progressing

## 2018-11-12 NOTE — Progress Notes (Signed)
9 Days Post-Op Procedure(s) (LRB): ESOPHAGOGASTRODUODENOSCOPY (EGD) WITH PROPOFOL (N/A) BIOPSY Subjective: No complaints.  Objective: Vital signs in last 24 hours: Temp:  [97.4 F (36.3 C)-99.4 F (37.4 C)] 99.4 F (37.4 C) (09/12 1157) Pulse Rate:  [70-84] 84 (09/12 1100) Cardiac Rhythm: Normal sinus rhythm (09/11 2000) Resp:  [15-23] 18 (09/12 1100) BP: (120-153)/(61-86) 153/75 (09/12 1100) SpO2:  [94 %-97 %] 95 % (09/12 1100) Weight:  [95.4 kg] 95.4 kg (09/12 0330)  Hemodynamic parameters for last 24 hours:    Intake/Output from previous day: 09/11 0701 - 09/12 0700 In: 2677.6 [P.O.:480; I.V.:205; NG/GT:1362.7; IV Piggyback:200] Out: 975 [Urine:975] Intake/Output this shift: No intake/output data recorded.  General appearance: slowed mentation Neurologic: intact Heart: regular rate and rhythm, S1, S2 normal, no murmur Lungs: clear to auscultation bilaterally Abdomen: soft, non-tender; bowel sounds normal Extremities: moderate anasarca but LUE edema significantly worse than RUE. PICC line on left Wound: incision healing well.  Lab Results: Recent Labs    11/10/18 0500 11/10/18 1455  WBC 7.2 8.4  HGB 9.2* 10.4*  HCT 29.5* 33.1*  PLT 91* 105*   BMET:  Recent Labs    11/11/18 0616 11/12/18 0353  NA 140 140  K 4.4 4.9  CL 105 106  CO2 23 22  GLUCOSE 142* 104*  BUN 69* 73*  CREATININE 3.46* 3.24*  CALCIUM 8.4* 8.5*    PT/INR: No results for input(s): LABPROT, INR in the last 72 hours. ABG    Component Value Date/Time   PHART 7.430 10/08/2018 0920   HCO3 23.6 10/08/2018 0920   TCO2 25 10/08/2018 0920   ACIDBASEDEF 6.0 (H) 10/08/2018 0307   O2SAT 94.0 10/08/2018 0920   CBG (last 3)  Recent Labs    11/11/18 2348 11/12/18 0817 11/12/18 1158  GLUCAP 148* 124* 100*    Assessment/Plan:  She remains hemodynamically stable in sinus rhythm.   Acute kidney injury of undetermined etiology. She remains non-oliguric with slight improvement in  creatinine today. Nephrology following. Will stop Rifampin for now in case this is contributing.  Continue Ancef for MSSA endocarditis.  Continue tube feeds for malnutrition with severe hypoalbuminemia.      LOS: 42 days    Audrey Collier 11/12/2018

## 2018-11-13 LAB — GLUCOSE, CAPILLARY
Glucose-Capillary: 137 mg/dL — ABNORMAL HIGH (ref 70–99)
Glucose-Capillary: 100 mg/dL — ABNORMAL HIGH (ref 70–99)
Glucose-Capillary: 102 mg/dL — ABNORMAL HIGH (ref 70–99)

## 2018-11-13 LAB — BASIC METABOLIC PANEL
Anion gap: 11 (ref 5–15)
BUN: 83 mg/dL — ABNORMAL HIGH (ref 6–20)
CO2: 26 mmol/L (ref 22–32)
Calcium: 8.5 mg/dL — ABNORMAL LOW (ref 8.9–10.3)
Chloride: 104 mmol/L (ref 98–111)
Creatinine, Ser: 3.39 mg/dL — ABNORMAL HIGH (ref 0.44–1.00)
GFR calc Af Amer: 17 mL/min — ABNORMAL LOW (ref >60)
GFR calc non Af Amer: 14 mL/min — ABNORMAL LOW (ref >60)
Glucose, Bld: 140 mg/dL — ABNORMAL HIGH (ref 70–99)
Potassium: 5.4 mmol/L — ABNORMAL HIGH (ref 3.5–5.1)
Sodium: 141 mmol/L (ref 135–145)

## 2018-11-13 LAB — POTASSIUM: Potassium: 5.3 mmol/L — ABNORMAL HIGH (ref 3.5–5.1)

## 2018-11-13 MED ORDER — ALTEPLASE 2 MG IJ SOLR
2.0000 mg | Freq: Once | INTRAMUSCULAR | Status: AC
  Administered 2018-11-13: 20:00:00 2 mg
  Filled 2018-11-13: qty 2

## 2018-11-13 NOTE — Progress Notes (Signed)
10 Days Post-Op Procedure(s) (LRB): ESOPHAGOGASTRODUODENOSCOPY (EGD) WITH PROPOFOL (N/A) BIOPSY Subjective: No complaints   Objective: Vital signs in last 24 hours: Temp:  [97.7 F (36.5 C)-99.4 F (37.4 C)] 98.3 F (36.8 C) (09/13 0824) Pulse Rate:  [67-88] 88 (09/13 1035) Cardiac Rhythm: Normal sinus rhythm (09/13 0400) Resp:  [15-24] 17 (09/13 0700) BP: (94-159)/(67-88) 144/81 (09/13 1035) SpO2:  [94 %-97 %] 96 % (09/13 0700) Weight:  [96.5 kg] 96.5 kg (09/13 0458)  Hemodynamic parameters for last 24 hours:    Intake/Output from previous day: 09/12 0701 - 09/13 0700 In: 2479.2 [P.O.:1080; I.V.:129.2; NG/GT:800; IV Piggyback:200] Out: 500 [Urine:500] Intake/Output this shift: No intake/output data recorded.  General appearance: slowed mentation Neurologic: nonfocal but not interactive at all which is unchanged. Heart: regular rate and rhythm, S1, S2 normal, no murmur Lungs: diminished breath sounds bibasilar Abdomen: soft, non-tender; bowel sounds normal Wound: incision healing well.  Lab Results: Recent Labs    11/10/18 1455  WBC 8.4  HGB 10.4*  HCT 33.1*  PLT 105*   BMET:  Recent Labs    11/12/18 0353 11/13/18 0459  NA 140 141  K 4.9 5.4*  CL 106 104  CO2 22 26  GLUCOSE 104* 140*  BUN 73* 83*  CREATININE 3.24* 3.39*  CALCIUM 8.5* 8.5*    PT/INR: No results for input(s): LABPROT, INR in the last 72 hours. ABG    Component Value Date/Time   PHART 7.430 10/08/2018 0920   HCO3 23.6 10/08/2018 0920   TCO2 25 10/08/2018 0920   ACIDBASEDEF 6.0 (H) 10/08/2018 0307   O2SAT 94.0 10/08/2018 0920   CBG (last 3)  Recent Labs    11/12/18 0817 11/12/18 1158 11/13/18 0413  GLUCAP 124* 100* 137*    Assessment/Plan:  S/P AVR for MSSA aortic valve endocarditis  Hemodynamically stable in sinus rhythm.  Respiratory status stable but has poor BS in bases. Will repeat CXR in am.  Tolerating TF at night and drinking some water but requires a lot of  encouragement to take po.  Acute non-oliguric renal failure of undetermined etiology. Nephrology folowing. She is total body markedly fluid overloaded despite rising BUN/creat.  Hyperkalemia to 5.4. may need kayexalate.  On Ancef for MSSA. Rifampin stopped in case it was contributing to renal failure.   LOS: 43 days    Gaye Pollack 11/13/2018

## 2018-11-13 NOTE — Progress Notes (Signed)
[facility]    NEPHROLOGY PROGRESS NOTE  SUBJECTIVE: Patient resting on exam.  Denies fevers, chills, chest pain, shortness of breath, or myalgias.  New skin rash noted on anterior chest.  All other review of systems are negative.   OBJECTIVE:  Vitals:   11/13/18 0824 11/13/18 1035  BP:  (!) 144/81  Pulse:  88  Resp:    Temp: 98.3 F (36.8 C)   SpO2:      Intake/Output Summary (Last 24 hours) at 11/13/2018 1152 Last data filed at 11/13/2018 0600 Gross per 24 hour  Intake 2224.41 ml  Output 500 ml  Net 1724.41 ml      General: Alert to self, NAD HEENT: MMM [place] AT anicteric sclera Neck:  No JVD, no adenopathy CV:  Heart RRR  Lungs:  L/S CTA bilaterally Abd:  abd slightly distended, nontender with normal BS GU:  Bladder non-palpable Extremities: +3 bilateral lower extremity edema. Skin: Positive skin rash on chest and right upper arm.  MEDICATIONS:  . sodium chloride   Intravenous Once  . amLODipine  5 mg Per Tube Daily  . calcium-vitamin D  1 tablet Oral Q breakfast  . Chlorhexidine Gluconate Cloth  6 each Topical Daily  . feeding supplement  1 Container Oral TID BM  . feeding supplement (PRO-STAT SUGAR FREE 64)  30 mL Per Tube TID  . FLUoxetine  10 mg Per Tube QHS  . levothyroxine  150 mcg Oral Q0600  . mouth rinse  15 mL Mouth Rinse BID  . metoCLOPramide (REGLAN) injection  5 mg Intravenous Q12H  . metoprolol tartrate  25 mg Per Tube BID  . montelukast  10 mg Oral QHS  . multivitamin  15 mL Oral Daily  . rosuvastatin  5 mg Oral q1800  . sodium chloride flush  3 mL Intravenous Q12H  . tamsulosin  0.4 mg Oral Daily       LABS:   CBC Latest Ref Rng & Units 11/10/2018 11/10/2018 11/08/2018  WBC 4.0 - 10.5 K/uL 8.4 7.2 7.0  Hemoglobin 12.0 - 15.0 g/dL 10.4(L) 9.2(L) 8.8(L)  Hematocrit 36.0 - 46.0 % 33.1(L) 29.5(L) 27.4(L)  Platelets 150 - 400 K/uL 105(L) 91(L) 93(L)    CMP Latest Ref Rng & Units 11/13/2018 11/12/2018 11/11/2018  Glucose 70 - 99  mg/dL 140(H) 104(H) 142(H)  BUN 6 - 20 mg/dL 83(H) 73(H) 69(H)  Creatinine 0.44 - 1.00 mg/dL 3.39(H) 3.24(H) 3.46(H)  Sodium 135 - 145 mmol/L 141 140 140  Potassium 3.5 - 5.1 mmol/L 5.4(H) 4.9 4.4  Chloride 98 - 111 mmol/L 104 106 105  CO2 22 - 32 mmol/L 26 22 23   Calcium 8.9 - 10.3 mg/dL 8.5(L) 8.5(L) 8.4(L)  Total Protein 6.5 - 8.1 g/dL - - -  Total Bilirubin 0.3 - 1.2 mg/dL - - -  Alkaline Phos 38 - 126 U/L - - -  AST 15 - 41 U/L - - -  ALT 0 - 44 U/L - - -    Lab Results  Component Value Date   CALCIUM 8.5 (L) 11/13/2018   CAION 1.19 10/08/2018   PHOS 6.5 (H) 11/12/2018       Component Value Date/Time   COLORURINE YELLOW 11/12/2018 1850   APPEARANCEUR CLEAR 11/12/2018 1850   LABSPEC 1.010 11/12/2018 1850   PHURINE 6.0 11/12/2018 1850   GLUCOSEU NEGATIVE 11/12/2018 1850   HGBUR MODERATE (A) 11/12/2018 1850   BILIRUBINUR NEGATIVE 11/12/2018 [place] 11/12/2018 1850   PROTEINUR 100 (A) 11/12/2018  1850   UROBILINOGEN 0.2 04/13/2014 1030   NITRITE NEGATIVE 11/12/2018 1850   LEUKOCYTESUR SMALL (A) 11/12/2018 1850      Component Value Date/Time   PHART 7.430 10/08/2018 0920   PCO2ART 35.6 10/08/2018 0920   PO2ART 70.0 (L) 10/08/2018 0920   HCO3 23.6 10/08/2018 0920   TCO2 25 10/08/2018 0920   ACIDBASEDEF 6.0 (H) 10/08/2018 0307   O2SAT 94.0 10/08/2018 0920       Component Value Date/Time   IRON 107 06/21/2015 1131   TIBC 309 06/21/2015 1131   FERRITIN 220 10/02/2018 0809   IRONPCTSAT 35 06/21/2015 1131       ASSESSMENT/PLAN:    10F with progressive nonoliguric renal failure status post aortic valve 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   1.  Acute kidney injury.  Remains nonoliguric.  Creatinine slightly improved today.  Urinalysis most consistent with AIN.  No fever but positive rash identified.  Creatinine overall stable.  ASO titer positive, but complements normal  arguing against postinfectious glomerulonephritis.  No evidence of obstruction on ultrasound.  PPI and rifampin D/C'ed.  Urine eos pending.  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  Ancef is also a possibility but is less likely given the time course.  Repeat UA with ongoing hematuria, proteinuria, and WBCs.  Will add additional serologies.  May need renal biopsy if no improvement.  2.  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  3.  Severe hypoalbuminemia.  Continue enteral nutrition.  4.  Cognitive delay.  5.  Anemia.  Hemoglobin is up trending.  Continue to monitor.  6.  Hyperkalemia.  We will give Lokelma x1.          [address]Jalyah Weinheimer Miguel BarreraFinnigan, DO, [place]

## 2018-11-13 NOTE — Progress Notes (Signed)
Patient ID: Audrey Collier, female   DOB: 1960-09-28, 58 y.o.   MRN: [id number]  TCTS Evening Rounds:  Hemodynamically stable in sinus rhythm.  K+ 5.4 this am and received Lokelma.  Will repeat K+ this pm.

## 2018-11-14 ENCOUNTER — Inpatient Hospital Stay (HOSPITAL_COMMUNITY): Payer: Medicaid Other

## 2018-11-14 DIAGNOSIS — R109 Unspecified abdominal pain: Secondary | ICD-10-CM

## 2018-11-14 DIAGNOSIS — R140 Abdominal distension (gaseous): Secondary | ICD-10-CM

## 2018-11-14 LAB — GLUCOSE, CAPILLARY
Glucose-Capillary: 89 mg/dL (ref 70–99)
Glucose-Capillary: 95 mg/dL (ref 70–99)
Glucose-Capillary: 102 mg/dL — ABNORMAL HIGH (ref 70–99)

## 2018-11-14 LAB — BASIC METABOLIC PANEL
Anion gap: 11 (ref 5–15)
BUN: 89 mg/dL — ABNORMAL HIGH (ref 6–20)
CO2: 25 mmol/L (ref 22–32)
Calcium: 8.8 mg/dL — ABNORMAL LOW (ref 8.9–10.3)
Chloride: 103 mmol/L (ref 98–111)
Creatinine, Ser: 3.48 mg/dL — ABNORMAL HIGH (ref 0.44–1.00)
GFR calc Af Amer: 16 mL/min — ABNORMAL LOW (ref >60)
GFR calc non Af Amer: 14 mL/min — ABNORMAL LOW (ref >60)
Glucose, Bld: 97 mg/dL (ref 70–99)
Potassium: 5.4 mmol/L — ABNORMAL HIGH (ref 3.5–5.1)
Sodium: 139 mmol/L (ref 135–145)

## 2018-11-14 LAB — CBC
HCT: 30.3 % — ABNORMAL LOW (ref 36.0–46.0)
Hemoglobin: 9 g/dL — ABNORMAL LOW (ref 12.0–15.0)
MCH: 29.6 pg (ref 26.0–34.0)
MCHC: 29.7 g/dL — ABNORMAL LOW (ref 30.0–36.0)
MCV: 99.7 fL (ref 80.0–100.0)
Platelets: 96 10*3/uL — ABNORMAL LOW (ref 150–400)
RBC: 3.04 MIL/uL — ABNORMAL LOW (ref 3.87–5.11)
RDW: 15.9 % — ABNORMAL HIGH (ref 11.5–15.5)
WBC: 7.8 10*3/uL (ref 4.0–10.5)
nRBC: 0 % (ref 0.0–0.2)

## 2018-11-14 LAB — GLOMERULAR BASEMENT MEMBRANE ANTIBODIES: GBM Ab: 3 units (ref 0–20)

## 2018-11-14 LAB — ANCA TITERS
Atypical P-ANCA titer: <1:20 {titer}
C-ANCA: <1:20 {titer}
P-ANCA: <1:20 {titer}

## 2018-11-14 LAB — ANA W/REFLEX IF POSITIVE: Anti Nuclear Antibody (ANA): NEGATIVE

## 2018-11-14 MED ORDER — NEPRO/CARBSTEADY PO LIQD
910.0000 mL | ORAL | Status: DC
  Administered 2018-11-14 – 2018-11-17 (×4): 910 mL
  Filled 2018-11-14 (×3): qty 948

## 2018-11-14 MED ORDER — NEPRO/CARBSTEADY PO LIQD: 910.0000 mL | ORAL | Status: DC

## 2018-11-14 MED ORDER — PRO-STAT SUGAR FREE PO LIQD
30.0000 mL | Freq: Two times a day (BID) | ORAL | Status: DC
  Administered 2018-11-14 – 2018-11-21 (×14): 30 mL
  Filled 2018-11-14 (×14): qty 30

## 2018-11-14 MED ORDER — SODIUM ZIRCONIUM CYCLOSILICATE 10 G PO PACK
10.0000 g | PACK | Freq: Once | ORAL | Status: AC
  Administered 2018-11-14: 16:00:00 10 g via ORAL
  Filled 2018-11-14: qty 1

## 2018-11-14 NOTE — Progress Notes (Signed)
TCTS DAILY ICU PROGRESS NOTE                   [address]            [place] [id number]          [phone]   11 Days Post-Op Procedure(s) (LRB): ESOPHAGOGASTRODUODENOSCOPY (EGD) WITH PROPOFOL (N/A) BIOPSY  Total Length of Stay:  LOS: 44 days   Subjective: Says she is uncomfortable, belly hurts  Objective: Vital signs in last 24 hours: Temp:  [97.7 F (36.5 C)-98.1 F (36.7 C)] 98 F (36.7 C) (09/14 0842) Pulse Rate:  [79-91] 82 (09/14 0700) Cardiac Rhythm: Normal sinus rhythm (09/14 0400) Resp:  [16-22] 16 (09/14 0700) BP: (132-155)/(67-85) 135/76 (09/14 0700) SpO2:  [89 %-96 %] 95 % (09/14 0700) Weight:  [93.6 kg] 93.6 kg (09/14 0500)  Filed Weights   11/12/18 0330 11/13/18 0458 11/14/18 0500  Weight: 95.4 kg 96.5 kg 93.6 kg    Weight change: -2.9 kg   Hemodynamic parameters for last 24 hours:    Intake/Output from previous day: 09/13 0701 - 09/14 0700 In: 1347.8 [P.O.:1200; I.V.:67.8; NG/GT:80] Out: 1350 [Urine:1350]  Intake/Output this shift: No intake/output data recorded.  Current Meds: Scheduled Meds: . sodium chloride   Intravenous Once  . amLODipine  5 mg Per Tube Daily  . calcium-vitamin D  1 tablet Oral Q breakfast  . Chlorhexidine Gluconate Cloth  6 each Topical Daily  . feeding supplement  1 Container Oral TID BM  . feeding supplement (PRO-STAT SUGAR FREE 64)  30 mL Per Tube TID  . FLUoxetine  10 mg Per Tube QHS  . levothyroxine  150 mcg Oral Q0600  . mouth rinse  15 mL Mouth Rinse BID  . metoCLOPramide (REGLAN) injection  5 mg Intravenous Q12H  . metoprolol tartrate  25 mg Per Tube BID  . montelukast  10 mg Oral QHS  . multivitamin  15 mL Oral Daily  . rosuvastatin  5 mg Oral q1800  . sodium chloride flush  3 mL Intravenous Q12H  . tamsulosin  0.4 mg Oral Daily   Continuous Infusions: . sodium chloride 10 mL/hr at 11/14/18 0600  .  ceFAZolin (ANCEF) IV 2 g (11/13/18 2242)  . feeding supplement (OSMOLITE 1.5 CAL)  Stopped (11/13/18 0800)   PRN Meds:.sodium chloride, acetaminophen, bisacodyl **OR** bisacodyl, lactulose, magic mouthwash, metoprolol tartrate, morphine injection, ondansetron (ZOFRAN) IV, oxyCODONE, sodium chloride flush, traMADol  General appearance: alert, cooperative, distracted and no distress Heart: regular rate and rhythm and no murmur Lungs: dim in lower fields Abdomen: soft, ?tender, nonspecific exam Extremities: + edema upper and lower extremities Wound: incis healing well  Lab Results: CBC: Recent Labs    11/14/18 0525  WBC 7.8  HGB 9.0*  HCT 30.3*  PLT 96*   BMET:  Recent Labs    11/13/18 0459 11/13/18 1857 11/14/18 0525  NA 141  --  139  K 5.4* 5.3* 5.4*  CL 104  --  103  CO2 26  --  25  GLUCOSE 140*  --  97  BUN 83*  --  89*  CREATININE 3.39*  --  3.48*  CALCIUM 8.5*  --  8.8*    CMET: Lab Results  Component Value Date   WBC 7.8 11/14/2018   HGB 9.0 (L) 11/14/2018   HCT 30.3 (L) 11/14/2018   PLT 96 (L) 11/14/2018   GLUCOSE 97 11/14/2018   TRIG 159 (H) 10/05/2018   ALT <5 11/07/2018  AST 22 11/07/2018   NA 139 11/14/2018   K 5.4 (H) 11/14/2018   CL 103 11/14/2018   CREATININE 3.48 (H) 11/14/2018   BUN 89 (H) 11/14/2018   CO2 25 11/14/2018   TSH 19.826 (H) 10/30/2018   INR 1.3 (H) 11/03/2018      PT/INR: No results for input(s): LABPROT, INR in the last 72 hours. Radiology: Dg Chest Port 1 View  Result Date: 11/14/2018 CLINICAL DATA:  58 year old female with chest pain after aortic valve replacement EXAM: PORTABLE CHEST 1 VIEW COMPARISON:  November 07, 2018 FINDINGS: Cardiomediastinal silhouette unchanged in size and contour. Surgical changes of median sternotomy and aortic valve replacement. Unchanged left upper extremity PICC. Similar appearance of low lung volumes and mild central vascular congestion with interlobular septal thickening. Blunting of the right costophrenic angle and cardiophrenic angle. Improved aeration at the left lung  base. No displaced fracture IMPRESSION: Similar appearance of the chest x-ray with evidence of mild pulmonary edema and small right pleural effusion with improving aeration at the left lung base. Surgical changes of median sternotomy and aortic valve repair. Unchanged left upper extremity PICC Electronically Signed   By: Gilmer MorJaime  Wagner D.O.   On: 11/14/2018 07:54     Assessment/Plan: S/P Procedure(s) (LRB): ESOPHAGOGASTRODUODENOSCOPY (EGD) WITH PROPOFOL (N/A) BIOPSY  1 hemodyn stable in sinus rhythm, BP elevated some- will increase norvasc to 10 2 sats ok on 2 liters [place] 3 renal fxn conts yo be significant  issue- nephrology managing/diagnostic evaluation- remains nonoliguric. Edema also effected by poor nutrition status/oncotic. May be a component of being intravasc dry. CXR does show some mild pulm edema however. Could be useful to check CVP or echocardiogram 4 conts ancef for MSSA endocarditis 5 cognitive impairment 6 anemia stable 7 PT recs SNF of [place] possibly- push rehab as able  Rowe ClackWayne E Gold PA-C 11/14/2018 8:57 AM  Pager [phone]

## 2018-11-14 NOTE — TOC Progression Note (Signed)
Transition of Care [facility]) - Progression Note    Patient Details  Name: CHRISHAUNA MEE MRN: [id number] Date of Birth: [birth year]  Transition of Care [facility]) CM/SW Contact  Eileen Stanford, LCSW Phone Number: 11/14/2018, 2:07 PM  Clinical Narrative:   Pt continues to have no bed offers.    Expected Discharge Plan: [place] Barriers to Discharge: No SNF bed, Continued Medical Work up  Expected Discharge Plan and Services Expected Discharge Plan: [place] In-house Referral: Clinical Social Work Discharge Planning Services: NA Post Acute Care Choice: [place] Living arrangements for the past 2 months: Single Family Home                 DME Arranged: N/A DME Agency: NA                   Social Determinants of Health (SDOH) Interventions    Readmission Risk Interventions No flowsheet data found.

## 2018-11-14 NOTE — Progress Notes (Signed)
[facility]    NEPHROLOGY PROGRESS NOTE  SUBJECTIVE: Patient resting on exam.  Denies fevers, chills, chest pain, shortness of breath, or myalgias.  Mild skin rash noted on anterior chest.  All other review of systems are negative.   OBJECTIVE:  Vitals:   11/14/18 1200 11/14/18 1250  BP:    Pulse: 81   Resp: (!) 22   Temp:  97.9 F (36.6 C)  SpO2: 94%     Intake/Output Summary (Last 24 hours) at 11/14/2018 1404 Last data filed at 11/14/2018 1200 Gross per 24 hour  Intake 820.81 ml  Output 1000 ml  Net -179.19 ml      General: Alert to self, NAD HEENT: MMM [place] AT anicteric sclera Neck:  No JVD, no adenopathy CV:  Heart RRR  Lungs:  L/S CTA bilaterally Abd:  abd slightly distended, nontender with normal BS GU:  Bladder non-palpable Extremities: +3 bilateral lower extremity edema. Skin: Positive skin rash on chest and right upper arm.  MEDICATIONS:  . sodium chloride   Intravenous Once  . amLODipine  5 mg Per Tube Daily  . calcium-vitamin D  1 tablet Oral Q breakfast  . Chlorhexidine Gluconate Cloth  6 each Topical Daily  . feeding supplement  1 Container Oral TID BM  . feeding supplement (PRO-STAT SUGAR FREE 64)  30 mL Per Tube BID  . FLUoxetine  10 mg Per Tube QHS  . levothyroxine  150 mcg Oral Q0600  . mouth rinse  15 mL Mouth Rinse BID  . metoCLOPramide (REGLAN) injection  5 mg Intravenous Q12H  . metoprolol tartrate  25 mg Per Tube BID  . montelukast  10 mg Oral QHS  . multivitamin  15 mL Oral Daily  . rosuvastatin  5 mg Oral q1800  . sodium chloride flush  3 mL Intravenous Q12H  . tamsulosin  0.4 mg Oral Daily       LABS:   CBC Latest Ref Rng & Units 11/14/2018 11/10/2018 11/10/2018  WBC 4.0 - 10.5 K/uL 7.8 8.4 7.2  Hemoglobin 12.0 - 15.0 g/dL 9.0(L) 10.4(L) 9.2(L)  Hematocrit 36.0 - 46.0 % 30.3(L) 33.1(L) 29.5(L)  Platelets 150 - 400 K/uL 96(L) 105(L) 91(L)    CMP Latest Ref Rng & Units 11/14/2018 11/13/2018 11/13/2018  Glucose 70 - 99  mg/dL 97 - 140(H)  BUN 6 - 20 mg/dL 89(H) - 83(H)  Creatinine 0.44 - 1.00 mg/dL 3.48(H) - 3.39(H)  Sodium 135 - 145 mmol/L 139 - 141  Potassium 3.5 - 5.1 mmol/L 5.4(H) 5.3(H) 5.4(H)  Chloride 98 - 111 mmol/L 103 - 104  CO2 22 - 32 mmol/L 25 - 26  Calcium 8.9 - 10.3 mg/dL 8.8(L) - 8.5(L)  Total Protein 6.5 - 8.1 g/dL - - -  Total Bilirubin 0.3 - 1.2 mg/dL - - -  Alkaline Phos 38 - 126 U/L - - -  AST 15 - 41 U/L - - -  ALT 0 - 44 U/L - - -    Lab Results  Component Value Date   CALCIUM 8.8 (L) 11/14/2018   CAION 1.19 10/08/2018   PHOS 6.5 (H) 11/12/2018       Component Value Date/Time   COLORURINE YELLOW 11/12/2018 1850   APPEARANCEUR CLEAR 11/12/2018 1850   LABSPEC 1.010 11/12/2018 1850   PHURINE 6.0 11/12/2018 1850   GLUCOSEU NEGATIVE 11/12/2018 1850   HGBUR MODERATE (A) 11/12/2018 1850   BILIRUBINUR NEGATIVE 11/12/2018 [place] 11/12/2018 1850   PROTEINUR 100 (A) 11/12/2018  1850   UROBILINOGEN 0.2 04/13/2014 1030   NITRITE NEGATIVE 11/12/2018 1850   LEUKOCYTESUR SMALL (A) 11/12/2018 1850      Component Value Date/Time   PHART 7.430 10/08/2018 0920   PCO2ART 35.6 10/08/2018 0920   PO2ART 70.0 (L) 10/08/2018 0920   HCO3 23.6 10/08/2018 0920   TCO2 25 10/08/2018 0920   ACIDBASEDEF 6.0 (H) 10/08/2018 0307   O2SAT 94.0 10/08/2018 0920       Component Value Date/Time   IRON 107 06/21/2015 1131   TIBC 309 06/21/2015 1131   FERRITIN 220 10/02/2018 0809   IRONPCTSAT 35 06/21/2015 1131       ASSESSMENT/PLAN:    89F with progressive nonoliguric renal failure status post aortic valve 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   1.  Acute kidney injury.  Remains nonoliguric.  Creatinine slightly improved today.  Urinalysis most consistent with AIN.  No fever but slight rash identified - seems to be less intense today.  Creatinine overall stable.  ASO titer positive, but complements  normal arguing against poststrep glomerulonephritis.  No evidence of obstruction on ultrasound.  PPI and rifampin D/C'ed.  Urine eos pending.  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  Ancef is also a possibility but is less likely given the time course.  Repeat UA with ongoing hematuria, proteinuria, and WBCs which have been present since admission, although renal function recently worsened. 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  The treatment for post staphylococcal glomerulonephritis is supportive.  Consideration may need to be given for a renal biopsy, although if renal function stays stable, it is unclear to me if it will change management, as steroids are not indicated for post staphylococcal glomerulonephritis.  The only consideration that may be helpful is if renal function continues to worsen, a diagnosis of allergic interstitial nephritis may result in changing of the Ancef to a different agent.  Would be reluctant to give steroids in the setting of this acute infection.  Will discuss antibiotic options with primary team.  Repeat complements are pending.  About 20% of cases of post staph GN have normal complements.  ASO titer elevated, but with normal complements, poststreptococcal glomerulonephritis is less likely.  ANCA and ANA also pending.  Would watch BUN and avoid protein over-feeding.  Hemodynamic measurements may be helpful.    2.  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  3.  Severe hypoalbuminemia.  Continue enteral nutrition.  4.  Cognitive delay.  5.  Anemia.  Hemoglobin is up trending.  Continue to monitor.  6.  Hyperkalemia.  Lokelma prn.         [address]Tifini Reeder [place], DO, [place]ACP

## 2018-11-14 NOTE — Progress Notes (Signed)
Physical Therapy Treatment Patient Details Name: Audrey Collier MRN: [id number] DOB: [birth year] Today's Date: 11/14/2018    History of Present Illness 58 yo female presented with fever, vomiting, weakness.  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  Echo showed vegetation.  Transferred to ICU 8/04 due to respiratory failure.  Underwent AVR with TEE intraoprative on 10/07/18. Extubated 10/08/18.  PEG placed 11/04/18.    PT Comments    Pt admitted with above diagnosis. Pt was able to stand with mod assist to [facility] 6 x and able to stand for varying seconds today with min guard assist.  Slight improvement today in standing tolerance.  Will continue to progress as pt able.  Pt currently with functional limitations due to balance and endurance deficits. Pt will benefit from skilled PT to increase their independence and safety with mobility to allow discharge to the venue listed below.     Follow Up Recommendations  SNF;LTACH     Equipment Recommendations  Other (comment)(TBA)    Recommendations for Other Services       Precautions / Restrictions Precautions Precautions: Fall;Sternal Precaution Comments: verbally reviewed with patient during functional tasks  Restrictions Weight Bearing Restrictions: No Other Position/Activity Restrictions: sternal precautions     Mobility  Bed Mobility Overal bed mobility: Needs Assistance Bed Mobility: Supine to Sit Rolling: +2 for physical assistance;Mod assist Sidelying to sit: Mod assist;+2 for physical assistance Supine to sit: Mod assist;+2 for physical assistance;HOB elevated     General bed mobility comments: pt initating movement of LEs towards EOB, but requires mod assist to fully complete motion, assist trunk upright and scoot hips towards EOB using pads   Transfers Overall transfer level: Needs assistance Equipment used: Rolling walker (2 wheeled) Transfers: Sit to/from [facility] Sit to Stand: Mod assist;+2 physical  assistance;From elevated surface Stand pivot transfers: Total assist(in [place])       General transfer comment: Pt stood in [place] 6 x.  First attempt was to get into [place].  Second attempt, she was wiped off as she was laying in urine in bed on arrival as purewick leaked.  She can stand upright with better posture today and was able to perform 4 seconds, 14 seconds, 10 seconds and then 6 seconds.  Her aunt was present today.    Ambulation/Gait             General Gait Details: unable   Stairs             Wheelchair Mobility    Modified Rankin (Stroke Patients Only)       Balance Overall balance assessment: Needs assistance Sitting-balance support: No upper extremity supported;Feet supported Sitting balance-Leahy Scale: Fair Sitting balance - Comments: min guard for dynamic EOB, supervision for static sitting Postural control: Posterior lean;Left lateral lean Standing balance support: Bilateral upper extremity supported;During functional activity Standing balance-Leahy Scale: Poor Standing balance comment: Stood x 6 x with  mod assist to stand and was able to miantian upright posture today for varying seconds                            Cognition Arousal/Alertness: Awake/alert Behavior During Therapy: Flat affect Overall Cognitive Status: History of cognitive impairments - at baseline Area of Impairment: Attention;Following commands;Safety/judgement;Problem solving;Awareness                   Current Attention Level: Sustained   Following Commands: Follows one step commands inconsistently;Follows one step  commands with increased time Safety/Judgement: Decreased awareness of deficits Awareness: Intellectual Problem Solving: Slow processing;Decreased initiation;Requires verbal cues;Requires tactile cues General Comments: continues to require increased time for processing and initiation       Exercises General Exercises - Lower Extremity Ankle  Circles/Pumps: AROM;10 reps;Seated;Both Quad Sets: AROM;Both;10 reps;Supine Heel Slides: AROM;Both;10 reps;Supine Straight Leg Raises: AROM;Both;10 reps;Supine    General Comments        Pertinent Vitals/Pain Pain Assessment: No/denies pain    Home Living                      Prior Function            PT Goals (current goals can now be found in the care plan section) Acute Rehab PT Goals Patient Stated Goal: none stated today Progress towards PT goals: Progressing toward goals    Frequency    Min 3X/week      PT Plan Current plan remains appropriate    Co-evaluation              AM-PAC PT "6 Clicks" Mobility   Outcome Measure  Help needed turning from your back to your side while in a flat bed without using bedrails?: A Lot Help needed moving from lying on your back to sitting on the side of a flat bed without using bedrails?: A Lot Help needed moving to and from a bed to a chair (including a wheelchair)?: Total Help needed standing up from a chair using your arms (e.g., wheelchair or bedside chair)?: A Lot Help needed to walk in hospital room?: Total Help needed climbing 3-5 steps with a railing? : Total 6 Click Score: 9    End of Session Equipment Utilized During Treatment: Gait belt;Oxygen Activity Tolerance: Patient limited by fatigue Patient left: in chair;with call bell/phone within reach;with chair alarm set;with family/visitor present Nurse Communication: Mobility status PT Visit Diagnosis: Muscle weakness (generalized) (M62.81);Other abnormalities of gait and mobility (R26.89) Pain - Right/Left: (generalized) Pain - part of body: (abdomen)     Time: [id number] PT Time Calculation (min) (ACUTE ONLY): 26 min  Charges:  $Therapeutic Exercise: 8-22 mins $Therapeutic Activity: 8-22 mins                     Nashanti Duquette,PT Acute Rehabilitation Services Pager:  [phone]  Office:  [phone]     Berline LopesDawn F Oluwaseun Cremer 11/14/2018, 4:07  PM

## 2018-11-14 NOTE — Progress Notes (Signed)
Nutrition Follow-up  DOCUMENTATION CODES:   Not applicable  INTERVENTION:   -Continue Boost Breeze po TID, each supplement provides 250 kcal and 9 grams of protein as tolerated   Change tube feeding:  -Nepro @ 65 ml/hr x 14 hours (1800-0800) -30 ml Prostat BID   Provides: 1838 kcals, 104 grams protein, 904 mg potassium, 752 mg phosphorus, 662 ml free water. Meets 100% of needs.   NUTRITION DIAGNOSIS:   Inadequate oral intake related to poor appetite as evidenced by meal completion < 50%.  Ongoing  GOAL:   Patient will meet greater than or equal to 90% of their needs  Addressed via TF  MONITOR:   PO intake, Supplement acceptance, Diet advancement, TF tolerance, Weight trends, Labs, I & O's, Skin  REASON FOR ASSESSMENT:   Consult Enteral/tube feeding initiation and management(nocturnal)  ASSESSMENT:   58 yo female admitted with fever, vomiting, weakness. Found to have MSSA bacteremia with PNA. Echo showed vegetation. Required transfer to the ICU and intubation on 8/4. PMH includes developmental delay, GERD, anxiety, hypothyroidism, HLD.   8/6- TEE showing large aortic valve vegetation 8/7- s/p aortic valve replacement 8/8- extubated, diet advanced to Dysphagia 2 8/21- Cortrak placement(tip in stomach); spoke with TCTS, ok to start TF 9/1- HIDA negative 9/3- Cortrak pulled during EGD 9/4- s/p PEG placement 9/7- PEG to gravity, CT abdomen negative 9/8- clear liquids, multiple episodes vomiting   RD working remotely.  Spoke with RN via phone. Reports pt had episode of emesis this afternoon upon eating ice cream. Pt was given reglan prior to episode. Per flowsheet, pt received Osmolite 1.5 @ 80 ml/hr x 12 hours. RD to adjust formula given hyperkalemia and lower volume of feeding. If emesis continues may consider advancing feeding tube as pt has had ongoing poor tolerance.   Admission weight: 77.7 kg Current weight: 93.6 kg   I/O: +9,714 ml since 8/31 UOP: 1,350 ml  x 24 hrs   Medications: calcium- vitamin D, reglan, liquid MVI Labs: K 5.4- plateau Cr 3.48- rising Phosphorus 6.5 (H)   Diet Order:   Diet Order            Diet clear liquid Room service appropriate? Yes; Fluid consistency: Thin  Diet effective now              EDUCATION NEEDS:   No education needs have been identified at this time  Skin:  Skin Assessment: Skin Integrity Issues: Skin Integrity Issues:: Stage II, Incisions DTI: R ear Stage II: R buttocks Incisions: chest Other: MASD-arm, buttocks, labia  Last BM:  9/13  Height:   Ht Readings from Last 1 Encounters:  10/07/18 5\' 2"  (1.575 m)    Weight:   Wt Readings from Last 1 Encounters:  11/14/18 93.6 kg    Ideal Body Weight:  56.8 kg  BMI:  Body mass index is 37.74 kg/m.  Estimated Nutritional Needs:   Kcal:  1800-2000  Protein:  100-115 gm  Fluid:  >/= 1.8 L   Mariana Single RD, LDN Clinical Nutrition Pager # [phone]

## 2018-11-14 NOTE — Progress Notes (Signed)
Daily Progress Note   Patient Name: Audrey SevereSheila D Thwaites       Date: 11/14/2018 DOB: [birth year]  Age: 58 y.o. MRN#: [id number] Attending Physician: Corliss SkainsLightfoot, Harrell O, MD Primary Care Physician: Pearson GrippeKim, James, MD Admit Date: 10/01/2018  Reason for Consultation/Follow-up: Establishing goals of care  Subjective: Patient awake, alert, responds to questions. Denies shortness of breath, does complain of pain when palpating abdominal region. RN at the bedside, bathing and providing skin care. Assisted with turning. Anasarca noted. Nurse reports patient's appetite remains poor with little to no intake. No family at the bedside.   Attempted to call Bjorn LoserRhonda and provide updates and continue goals of care discussion regarding aggressive versus comfort care and code status. Family requested time to discuss over the weekend with expected follow up discussion the first of this week, with also watchful waiting. They remained hopeful for some improvement or stability. Voicemail left.   Length of Stay: 44  Current Medications: Scheduled Meds:  . sodium chloride   Intravenous Once  . amLODipine  5 mg Per Tube Daily  . calcium-vitamin D  1 tablet Oral Q breakfast  . Chlorhexidine Gluconate Cloth  6 each Topical Daily  . feeding supplement  1 Container Oral TID BM  . feeding supplement (PRO-STAT SUGAR FREE 64)  30 mL Per Tube TID  . FLUoxetine  10 mg Per Tube QHS  . levothyroxine  150 mcg Oral Q0600  . mouth rinse  15 mL Mouth Rinse BID  . metoCLOPramide (REGLAN) injection  5 mg Intravenous Q12H  . metoprolol tartrate  25 mg Per Tube BID  . montelukast  10 mg Oral QHS  . multivitamin  15 mL Oral Daily  . rosuvastatin  5 mg Oral q1800  . sodium chloride flush  3 mL Intravenous Q12H  . tamsulosin  0.4 mg Oral  Daily    Continuous Infusions: . sodium chloride 10 mL/hr at 11/14/18 0900  .  ceFAZolin (ANCEF) IV 2 g (11/13/18 2242)  . feeding supplement (OSMOLITE 1.5 CAL) Stopped (11/13/18 0800)    PRN Meds: sodium chloride, acetaminophen, bisacodyl **OR** bisacodyl, lactulose, magic mouthwash, metoprolol tartrate, morphine injection, ondansetron (ZOFRAN) IV, oxyCODONE, sodium chloride flush, traMADol  Physical Exam          -Alert to self, NAD, obese -RRR -diminished bases -Abdominal distention, some tenderness, +  bowel sounds x4 -bilateral lower extremity edema, upper extremity edema, right hand edema   Vital Signs: BP (!) 155/77   Pulse 85   Temp 98 F (36.7 C) (Oral)   Resp 13   Ht 5\' 2"  (1.575 m)   Wt 93.6 kg   LMP 04/16/2013 (Approximate)   SpO2 97%   BMI 37.74 kg/m  SpO2: SpO2: 97 % O2 Device: O2 Device: Nasal Cannula O2 Flow Rate: O2 Flow Rate (L/min): 2 L/min  Intake/output summary:   Intake/Output Summary (Last 24 hours) at 11/14/2018 1211 Last data filed at 11/14/2018 1125 Gross per 24 hour  Intake 820.81 ml  Output 1000 ml  Net -179.19 ml   LBM: Last BM Date: 11/13/18 Baseline Weight: Weight: 76.7 kg Most recent weight: Weight: 93.6 kg       Palliative Assessment/Data: PEG/POOR PO INTAKE     Patient Active Problem List   Diagnosis Date Noted  . Palliative care by specialist   . Pressure injury of skin 10/10/2018  . S/P AVR (aortic valve replacement) 10/07/2018  . Acute respiratory failure with hypoxia ([place])   . Staphylococcus aureus bacteremia with sepsis ([place]) 10/03/2018  . Bacteremia due to methicillin susceptible Staphylococcus aureus (MSSA)   . Aortic valve endocarditis   . Multifocal pneumonia   . Acute hypoxemic respiratory failure ([place])   . Collier sepsis ([place]) 10/01/2018  . GERD (gastroesophageal reflux disease) 03/18/2017  . Loose stools 09/30/2015  . Abdominal pain 11/30/2014  . Helicobacter pylori gastritis 10/24/2014  . PMB  (postmenopausal bleeding) 03/26/2014  . Thickened endometrium 03/26/2014  . Endometrial polyp 03/26/2014  . Constipation 04/11/2013  . Fecal occult blood test positive 03/15/2013    Palliative Care Assessment & Plan   Recommendations/Plan:  FULL CODE  Continue with current plan of care per medical team  Voicemail left with [name]/Jimmie for further [place] discussion (aggressive care/code status).   CSW working on placement. Family unable to provide care given patient's increased medical needs and their own health concerns per discussion last week.   PMT will continue to support and follow.   Goals of Care and Additional Recommendations:  Limitations on Scope of Treatment: Full Scope Treatment  Code Status:    Code Status Orders  (From admission, onward)         Start     Ordered   10/02/18 0218  Full code  Continuous     10/02/18 0217        Code Status History    This patient has a current code status but no historical code status.   Advance Care Planning Activity      Prognosis:   Unable to determine (Guarded to Poor)  Discharge Planning:  To Be Determined  Care plan was discussed with RN.   Thank you for allowing the Palliative Medicine Team to assist in the care of this patient.  Total Time: 25 min.   Greater than 50%  of this time was spent counseling and coordinating care related to the above assessment and plan.  Alda Lea, AGPCNP-BC Palliative Medicine Team  Phone: [phone] Pager: [phone] Amion: Bjorn Pippin    Please contact Palliative Medicine Team phone at [phone] for questions and concerns.

## 2018-11-15 LAB — PROTEIN ELECTROPHORESIS, SERUM
A/G Ratio: 0.8 (ref 0.7–1.7)
Albumin ELP: 2.3 g/dL — ABNORMAL LOW (ref 2.9–4.4)
Alpha-1-Globulin: 0.4 g/dL (ref 0.0–0.4)
Alpha-2-Globulin: 0.8 g/dL (ref 0.4–1.0)
Beta Globulin: 0.7 g/dL (ref 0.7–1.3)
Gamma Globulin: 1.1 g/dL (ref 0.4–1.8)
Globulin, Total: 2.9 g/dL (ref 2.2–3.9)
Total Protein ELP: 5.2 g/dL — ABNORMAL LOW (ref 6.0–8.5)

## 2018-11-15 LAB — BASIC METABOLIC PANEL
Anion gap: 12 (ref 5–15)
BUN: 92 mg/dL — ABNORMAL HIGH (ref 6–20)
CO2: 22 mmol/L (ref 22–32)
Calcium: 8.8 mg/dL — ABNORMAL LOW (ref 8.9–10.3)
Chloride: 106 mmol/L (ref 98–111)
Creatinine, Ser: 3.31 mg/dL — ABNORMAL HIGH (ref 0.44–1.00)
GFR calc Af Amer: 17 mL/min — ABNORMAL LOW (ref >60)
GFR calc non Af Amer: 15 mL/min — ABNORMAL LOW (ref >60)
Glucose, Bld: 142 mg/dL — ABNORMAL HIGH (ref 70–99)
Potassium: 4.8 mmol/L (ref 3.5–5.1)
Sodium: 140 mmol/L (ref 135–145)

## 2018-11-15 LAB — HEPATITIS C ANTIBODY: HCV Ab: <0.1 s/co ratio (ref 0.0–0.9)

## 2018-11-15 LAB — GLUCOSE, CAPILLARY
Glucose-Capillary: 103 mg/dL — ABNORMAL HIGH (ref 70–99)
Glucose-Capillary: 129 mg/dL — ABNORMAL HIGH (ref 70–99)
Glucose-Capillary: 114 mg/dL — ABNORMAL HIGH (ref 70–99)

## 2018-11-15 LAB — C3 COMPLEMENT: C3 Complement: 134 mg/dL (ref 82–167)

## 2018-11-15 LAB — C4 COMPLEMENT: Complement C4, Body Fluid: 24 mg/dL (ref 14–44)

## 2018-11-15 MED ORDER — AMLODIPINE BESYLATE 5 MG PO TABS
5.0000 mg | ORAL_TABLET | Freq: Every day | ORAL | Status: DC
  Administered 2018-11-16 – 2018-11-26 (×11): 5 mg via ORAL
  Filled 2018-11-15 (×11): qty 1

## 2018-11-15 MED ORDER — FLUOXETINE HCL 10 MG PO CAPS
10.0000 mg | ORAL_CAPSULE | Freq: Every day | ORAL | Status: DC
  Administered 2018-11-16 – 2018-11-29 (×14): 10 mg via ORAL
  Filled 2018-11-15 (×17): qty 1

## 2018-11-15 MED ORDER — METOPROLOL TARTRATE 25 MG PO TABS: 25.0000 mg | ORAL_TABLET | Freq: Two times a day (BID) | ORAL | Status: DC

## 2018-11-15 MED ORDER — METOPROLOL TARTRATE 25 MG PO TABS
25.0000 mg | ORAL_TABLET | Freq: Two times a day (BID) | ORAL | Status: DC
  Administered 2018-11-15 – 2018-11-29 (×29): 25 mg via ORAL
  Filled 2018-11-15 (×29): qty 1

## 2018-11-15 NOTE — Progress Notes (Signed)
[facility]    NEPHROLOGY PROGRESS NOTE  SUBJECTIVE:  Reports itchy rash on face  OBJECTIVE:  Vitals:   11/15/18 0511 11/15/18 0903  BP: 133/73 139/78  Pulse: 80 83  Resp: 17   Temp:    SpO2: 96%     Intake/Output Summary (Last 24 hours) at 11/15/2018 1053 Last data filed at 11/15/2018 0905 Gross per 24 hour  Intake 662.88 ml  Output 900 ml  Net -237.12 ml      General: Alert to self, NAD HEENT: some red rash on cheeks L > R Neck:  No JVD, no adenopathy CV:  Heart RRR  Lungs:  L/S CTA bilaterally Abd:  abd slightly distended, nontender with normal BS Extremities: +3 upper and lower extremity edema Skin: Positive skin rash on chest and right upper arm.  MEDICATIONS:  . sodium chloride   Intravenous Once  . amLODipine  5 mg Per Tube Daily  . calcium-vitamin D  1 tablet Oral Q breakfast  . Chlorhexidine Gluconate Cloth  6 each Topical Daily  . feeding supplement  1 Container Oral TID BM  . feeding supplement (PRO-STAT SUGAR FREE 64)  30 mL Per Tube BID  . FLUoxetine  10 mg Per Tube QHS  . levothyroxine  150 mcg Oral Q0600  . mouth rinse  15 mL Mouth Rinse BID  . metoCLOPramide (REGLAN) injection  5 mg Intravenous Q12H  . metoprolol tartrate  25 mg Per Tube BID  . montelukast  10 mg Oral QHS  . multivitamin  15 mL Oral Daily  . rosuvastatin  5 mg Oral q1800  . sodium chloride flush  3 mL Intravenous Q12H  . tamsulosin  0.4 mg Oral Daily       LABS:   CBC Latest Ref Rng & Units 11/14/2018 11/10/2018 11/10/2018  WBC 4.0 - 10.5 K/uL 7.8 8.4 7.2  Hemoglobin 12.0 - 15.0 g/dL 9.0(L) 10.4(L) 9.2(L)  Hematocrit 36.0 - 46.0 % 30.3(L) 33.1(L) 29.5(L)  Platelets 150 - 400 K/uL 96(L) 105(L) 91(L)    CMP Latest Ref Rng & Units 11/15/2018 11/14/2018 11/13/2018  Glucose 70 - 99 mg/dL 142(H) 97 -  BUN 6 - 20 mg/dL 92(H) 89(H) -  Creatinine 0.44 - 1.00 mg/dL 3.31(H) 3.48(H) -  Sodium 135 - 145 mmol/L 140 139 -  Potassium 3.5 - 5.1 mmol/L 4.8 5.4(H) 5.3(H)   Chloride 98 - 111 mmol/L 106 103 -  CO2 22 - 32 mmol/L 22 25 -  Calcium 8.9 - 10.3 mg/dL 8.8(L) 8.8(L) -  Total Protein 6.5 - 8.1 g/dL - - -  Total Bilirubin 0.3 - 1.2 mg/dL - - -  Alkaline Phos 38 - 126 U/L - - -  AST 15 - 41 U/L - - -  ALT 0 - 44 U/L - - -    Lab Results  Component Value Date   CALCIUM 8.8 (L) 11/15/2018   CAION 1.19 10/08/2018   PHOS 6.5 (H) 11/12/2018       Component Value Date/Time   COLORURINE YELLOW 11/12/2018 1850   APPEARANCEUR CLEAR 11/12/2018 1850   LABSPEC 1.010 11/12/2018 1850   PHURINE 6.0 11/12/2018 1850   GLUCOSEU NEGATIVE 11/12/2018 1850   HGBUR MODERATE (A) 11/12/2018 1850   BILIRUBINUR NEGATIVE 11/12/2018 1850   KETONESUR NEGATIVE 11/12/2018 1850   PROTEINUR 100 (A) 11/12/2018 1850   UROBILINOGEN 0.2 04/13/2014 1030   NITRITE NEGATIVE 11/12/2018 1850   LEUKOCYTESUR SMALL (A) 11/12/2018 1850      Component Value Date/Time  PHART 7.430 10/08/2018 0920   PCO2ART 35.6 10/08/2018 0920   PO2ART 70.0 (L) 10/08/2018 0920   HCO3 23.6 10/08/2018 0920   TCO2 25 10/08/2018 0920   ACIDBASEDEF 6.0 (H) 10/08/2018 0307   O2SAT 94.0 10/08/2018 0920       Component Value Date/Time   IRON 107 06/21/2015 1131   TIBC 309 06/21/2015 1131   FERRITIN 220 10/02/2018 0809   IRONPCTSAT 35 06/21/2015 1131       ASSESSMENT/PLAN:     1.  Acute kidney injury.  Remains nonoliguric.  Cr in the mid 3s.  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  Serologies and complements negative.  She may need a biopsy if cr doesn't improve--> will give one more day and if no then will need to differentiate (AIN would warrant consideration of steroids).    2.  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  3.  Severe hypoalbuminemia.  Continue enteral nutrition.  4.  Cognitive delay.  5.  Anemia.  Hemoglobin is up trending.  Continue to monitor.  6.  Hyperkalemia.  Lokelma prn.    Bufford ButtnerElizabeth Krimson Massmann MD

## 2018-11-15 NOTE — Progress Notes (Signed)
      [address]       [place] [address]             [phone]      12 Days Post-Op Procedure(s) (LRB): ESOPHAGOGASTRODUODENOSCOPY (EGD) WITH PROPOFOL (N/A) BIOPSY   Subjective:  Patient states she doesn't feel well. Continues to have abdominal complaints.  New onset rash along bilateral cheeks.  Objective: Vital signs in last 24 hours: Temp:  [97.5 F (36.4 C)-98.5 F (36.9 C)] 97.5 F (36.4 C) (09/15 0400) Pulse Rate:  [72-86] 83 (09/15 0903) Cardiac Rhythm: Normal sinus rhythm (09/15 0400) Resp:  [11-24] 17 (09/15 0511) BP: (133-152)/(71-88) 139/78 (09/15 0903) SpO2:  [94 %-97 %] 96 % (09/15 0511) Weight:  [95.9 kg] 95.9 kg (09/15 0500)  Intake/Output from previous day: 09/14 0701 - 09/15 0700 In: 609.9 [I.V.:186.3; NG/GT:423.6] Out: 800 [Urine:800] Intake/Output this shift: Total I/O In: 83 [I.V.:83] Out: 100 [Urine:100]  General appearance: alert and cooperative... New onset red rash along right and left cheek Heart: regular rate and rhythm Lungs: diminished breath sounds bibasilar Abdomen: Soft, distended, + tenderness, + BS Wound: Clean and dry, rash remains present along chest  Lab Results: Recent Labs    11/14/18 0525  WBC 7.8  HGB 9.0*  HCT 30.3*  PLT 96*   BMET:  Recent Labs    11/13/18 0459 11/13/18 1857 11/14/18 0525  NA 141  --  139  K 5.4* 5.3* 5.4*  CL 104  --  103  CO2 26  --  25  GLUCOSE 140*  --  97  BUN 83*  --  89*  CREATININE 3.39*  --  3.48*  CALCIUM 8.5*  --  8.8*    PT/INR: No results for input(s): LABPROT, INR in the last 72 hours. ABG    Component Value Date/Time   PHART 7.430 10/08/2018 0920   HCO3 23.6 10/08/2018 0920   TCO2 25 10/08/2018 0920   ACIDBASEDEF 6.0 (H) 10/08/2018 0307   O2SAT 94.0 10/08/2018 0920   CBG (last 3)  Recent Labs    11/14/18 1248 11/14/18 2216 11/15/18 0357  GLUCAP 95 102* 103*    Assessment/Plan: S/P Procedure(s) (LRB): ESOPHAGOGASTRODUODENOSCOPY (EGD)  WITH PROPOFOL (N/A) BIOPSY  1. CV- hemodynamically stable in NSR, BP improved with increase in Norvasc 2. Pulm- no acute issues, continue IS 3. Renal- creatinine pending for today, AIN per nephrology... however with new onset facial rash.. should we consider Lupus in the differential as cause for renal issues 4. GI- oral intake remains poor, continue tube feedings and encourage oral intake 5. ID- remains afebrile, no leukocytosis, on Ancef for treatment of Endocarditis 6. Cognitive delay 7. Deconditioning- continues to have no bed offers 8. Dispo- patients creatinine is pending, Nephrology is following.. new onset rash along right and left cheek, ? Possible lupus, as mother had, may need to order rheumatology workup, consult, continue to encourage oral intake, continue tube feeds, on Ancef for endocarditis, continue current care   LOS: 45 days    Ellwood Handler 11/15/2018

## 2018-11-15 NOTE — Progress Notes (Signed)
Occupational Therapy Treatment Patient Details Name: Audrey Collier MRN: [id number] DOB: May 26, 1960 Today's Date: 11/15/2018    History of present illness 58 yo female presented with fever, vomiting, weakness.  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  Echo showed vegetation.  Transferred to ICU 8/04 due to respiratory failure.  Underwent AVR with TEE intraoprative on 10/07/18. Extubated 10/08/18.  PEG placed 11/04/18.   OT comments  This 58 yo female admitted with above presents to acute OT today participating in extremity x 4 exercises, bed mobility, sitting EOB and working on sit<>stand x2 with sara stedy (but adamant about not wanting to get up to the recliner). Her arms remain edematous (AAROM and propping to work on diminishing this today). She will continue to benefit from acute OT with follow up at SNF.  Follow Up Recommendations  SNF;Supervision/Assistance - 24 hour    Equipment Recommendations  3 in 1 bedside commode       Precautions / Restrictions Precautions Precautions: Fall;Sternal Restrictions Other Position/Activity Restrictions: sternal precautions        Mobility Bed Mobility Overal bed mobility: Needs Assistance Bed Mobility: Rolling;Supine to Sit Rolling: Mod assist Sidelying to sit: Mod assist       General bed mobility comments: VCs for sequencing  Transfers     Transfers: Sit to/from Stand Sit to Stand: Max assist         General transfer comment: only able to partially stand with +1 A    Balance Overall balance assessment: Needs assistance Sitting-balance support: No upper extremity supported;Feet supported Sitting balance-Leahy Scale: Fair                                     ADL either performed or assessed with clinical judgement   ADL Overall ADL's : Needs assistance/impaired     Grooming: Wash/dry face;Supervision/safety;Set up;Sitting Grooming Details (indicate cue type and reason): EOB                                      Vision Baseline Vision/History: Wears glasses Patient Visual Report: No change from baseline            Cognition Arousal/Alertness: Awake/alert Behavior During Therapy: Flat affect Overall Cognitive Status: History of cognitive impairments - at baseline Area of Impairment: Following commands;Safety/judgement;Problem solving                       Following Commands: Follows one step commands inconsistently;Follows one step commands with increased time Safety/Judgement: Decreased awareness of deficits   Problem Solving: Slow processing;Decreased initiation;Difficulty sequencing;Requires verbal cues;Requires tactile cues          Exercises Other Exercises Other Exercises: Worked with pt on doing bed level exercises x4 extremities prior to sitting up on EOB, AAROM--5 reps of each. Arms shoulder flexion/ext, elbow flex/ext, wrist flex/ext, composite digit flex/ext. Legs ankle dorsi and plantar flex, knee fles/ext, and hip add/abduction. Arms are really edemtous (L>R)--arms in a dependent position in bed when I arrived--propped both arms up for edema control at end of session           Pertinent Vitals/ Pain       Pain Assessment: No/denies pain         Frequency  Min 2X/week        Progress Toward Goals  OT Goals(current goals can now be found in the care plan section)  Progress towards OT goals: Progressing toward goals     Plan Discharge plan remains appropriate       AM-PAC OT "6 Clicks" Daily Activity     Outcome Measure   Help from another person eating meals?: A Lot Help from another person taking care of personal grooming?: A Lot Help from another person toileting, which includes using toliet, bedpan, or urinal?: Total Help from another person bathing (including washing, rinsing, drying)?: A Lot Help from another person to put on and taking off regular upper body clothing?: Total Help from another person to put on and  taking off regular lower body clothing?: Total 6 Click Score: 9    End of Session Equipment Utilized During Treatment: Gait belt  OT Visit Diagnosis: Other abnormalities of gait and mobility (R26.89);Muscle weakness (generalized) (M62.81)   Activity Tolerance Patient tolerated treatment well   Patient Left in bed;with call bell/phone within reach;with bed alarm set   Nurse Communication          Time: [id number] OT Time Calculation (min): 37 min  Charges: OT General Charges $OT Visit: 1 Visit OT Treatments $Self Care/Home Management : 8-22 mins $Therapeutic Exercise: 8-22 mins  Ignacia Palmaathy Teneisha Gignac, OTR/L [facility] Pager [phone] Office [phone]      Evette GeorgesLeonard, Ashiyah Pavlak Eva 11/15/2018, 12:26 PM

## 2018-11-15 NOTE — Plan of Care (Signed)
  Problem: Nutrition: Goal: Adequate nutrition will be maintained Outcome: Progressing   Problem: Clinical Measurements: Goal: Will remain free from infection Outcome: Progressing   Problem: Activity: Goal: Risk for activity intolerance will decrease Outcome: Progressing

## 2018-11-15 NOTE — Progress Notes (Signed)
Patient ID: Audrey Collier, female   DOB: 1960/10/04, 58 y.o.   MRN: [id number] EVENING ROUNDS NOTE :     [address]       [place] [address]             [phone]                 12 Days Post-Op Procedure(s) (LRB): ESOPHAGOGASTRODUODENOSCOPY (EGD) WITH PROPOFOL (N/A) BIOPSY  Total Length of Stay:  LOS: 45 days  BP 125/75 (BP Location: Right Arm)   Pulse 80   Temp 98 F (36.7 C) (Oral)   Resp 17   Ht 5\' 2"  (1.575 m)   Wt 95.9 kg   LMP 04/16/2013 (Approximate)   SpO2 97%   BMI 38.67 kg/m   .Intake/Output      09/14 0701 - 09/15 0700 09/15 0701 - 09/16 0700   P.O.     I.V. (mL/kg) 186.3 (1.9) 112.5 (1.2)   NG/GT 423.6    Total Intake(mL/kg) 609.9 (6.4) 112.5 (1.2)   Urine (mL/kg/hr) 800 (0.3) 250 (0.2)   Emesis/NG output     Stool  0   Total Output 800 250   Net -190.1 -137.5        Stool Occurrence  1 x     . sodium chloride 10 mL/hr at 11/15/18 1200  .  ceFAZolin (ANCEF) IV 2 g (11/14/18 2225)  . feeding supplement (NEPRO CARB STEADY) 65 mL/hr at 11/14/18 2100     Lab Results  Component Value Date   WBC 7.8 11/14/2018   HGB 9.0 (L) 11/14/2018   HCT 30.3 (L) 11/14/2018   PLT 96 (L) 11/14/2018   GLUCOSE 142 (H) 11/15/2018   TRIG 159 (H) 10/05/2018   ALT <5 11/07/2018   AST 22 11/07/2018   NA 140 11/15/2018   K 4.8 11/15/2018   CL 106 11/15/2018   CREATININE 3.31 (H) 11/15/2018   BUN 92 (H) 11/15/2018   CO2 22 11/15/2018   TSH 19.826 (H) 10/30/2018   INR 1.3 (H) 11/03/2018   Says she is sleepy Cr 3.31    Grace Isaac MD  Beeper [phone] Office [phone] 11/15/2018 5:56 PM

## 2018-11-15 NOTE — Progress Notes (Addendum)
Daily Progress Note   Patient Name: Audrey Collier       Date: 11/15/2018 DOB: 16-Apr-1960  Age: 58 y.o. MRN#: [id number] Attending Physician: Corliss Skains, MD Primary Care Physician: Pearson Grippe, MD Admit Date: 10/01/2018  Reason for Consultation/Follow-up: Establishing goals of care  Subjective: Patient awake, alert, responds to questions. Denies pain except on when palpating abdominal areas. States "I don't feel good today". Bilateral red fine rash to cheeks and upper thoracic area. Follows commands. No family at the bedside. PO intake remains poor.   I called to follow up with [name]. No answer and voicemail left.   1536: Received a call back from [place]. Updates provided and all questions answered to the best of my ability. She reports she is aware that patient most likely will transfer off the unit later today. She is now concerned that patient may have Lupus as her mother did. She is tearful stating "we don't think this will end well for her!" Support given. Jimmie also on the phone verbalizing his concern.   We discussed patient's full code status with consideration of her current illness and co-morbidities. Both Jimmie and Bjorn Loser verbalized wishes for patient not to undergo heroic measures, but expressing they did not want her not to receive appropriate work-ups and care. I educated family patient would continue to receive all recommended care and with a DNR/DNI if she was to pass away, we would allow her to pass away naturally without heroic measures. They verbalized understanding and again expressing wishes for her life not to be prolonged as quality matters over quantity to them. I educated family that DNR/DNI would be placed on file and purple bracelet will be placed on patient's  arm. Family expressed they do remain hopeful patient will improve, however, they are also prepared for the worst (further decline or no/minimal improvement). Family remains upset also that they can no longer care for her in the home due to her increased needs in care. Support given.    Length of Stay: 45  Current Medications: Scheduled Meds:   sodium chloride   Intravenous Once   [START ON 11/16/2018] amLODipine  5 mg Oral Daily   calcium-vitamin D  1 tablet Oral Q breakfast   Chlorhexidine Gluconate Cloth  6 each Topical Daily   feeding supplement  1 Container  Oral TID BM   feeding supplement (PRO-STAT SUGAR FREE 64)  30 mL Per Tube BID   FLUoxetine  10 mg Oral Daily   levothyroxine  150 mcg Oral Q0600   mouth rinse  15 mL Mouth Rinse BID   metoCLOPramide (REGLAN) injection  5 mg Intravenous Q12H   metoprolol tartrate  25 mg Oral BID   montelukast  10 mg Oral QHS   multivitamin  15 mL Oral Daily   rosuvastatin  5 mg Oral q1800   sodium chloride flush  3 mL Intravenous Q12H   tamsulosin  0.4 mg Oral Daily    Continuous Infusions:  sodium chloride 10 mL/hr at 11/15/18 1200    ceFAZolin (ANCEF) IV 2 g (11/14/18 2225)   feeding supplement (NEPRO CARB STEADY) 65 mL/hr at 11/14/18 2100    PRN Meds: sodium chloride, acetaminophen, bisacodyl **OR** bisacodyl, lactulose, magic mouthwash, metoprolol tartrate, morphine injection, ondansetron (ZOFRAN) IV, oxyCODONE, sodium chloride flush, traMADol  Physical Exam          -Alert to self, NAD, obese -RRR -diminished bases -Abdominal distention, some tenderness, +bowel sounds x4 -bilateral lower extremity edema, upper extremity edema, right hand edema -red fine rash to bilateral cheeks and thoracic area  Vital Signs: BP 136/79    Pulse 76    Temp 98.6 F (37 C) (Oral)    Resp 19    Ht 5\' 2"  (1.575 m)    Wt 95.9 kg    LMP 04/16/2013 (Approximate)    SpO2 97%    BMI 38.67 kg/m  SpO2: SpO2: 97 % O2 Device: O2 Device:  Nasal Cannula O2 Flow Rate: O2 Flow Rate (L/min): 2 L/min  Intake/output summary:   Intake/Output Summary (Last 24 hours) at 11/15/2018 1549 Last data filed at 11/15/2018 1300 Gross per 24 hour  Intake 689.35 ml  Output 1050 ml  Net -360.65 ml   LBM: Last BM Date: 11/15/18 Baseline Weight: Weight: 76.7 kg Most recent weight: Weight: 95.9 kg       Palliative Assessment/Data: PEG/POOR PO INTAKE   Flowsheet Rows     Most Recent Value  Intake Tab  Referral Department  Surgery  Unit at Time of Referral  ICU  Palliative Care Primary Diagnosis  Cardiac  Date Notified  11/09/18  Palliative Care Type  New Palliative care  Reason for referral  Clarify Goals of Care  Date of Admission  10/01/18  Date first seen by Palliative Care  11/10/18  # of days Palliative referral response time  1 Day(s)  # of days IP prior to Palliative referral  39  Clinical Assessment  Psychosocial & Spiritual Assessment  Palliative Care Outcomes      Patient Active Problem List   Diagnosis Date Noted   Palliative care by specialist    Pressure injury of skin 10/10/2018   S/P AVR (aortic valve replacement) 10/07/2018   Acute respiratory failure with hypoxia ([name])    Staphylococcus aureus bacteremia with sepsis ([name]) 10/03/2018   Bacteremia due to methicillin susceptible Staphylococcus aureus (MSSA)    Aortic valve endocarditis    Multifocal pneumonia    Acute hypoxemic respiratory failure ([name])    Severe sepsis ([name]) 10/01/2018   GERD (gastroesophageal reflux disease) 03/18/2017   Loose stools 09/30/2015   Abdominal pain 11/30/2014   Helicobacter pylori gastritis 10/24/2014   PMB (postmenopausal bleeding) 03/26/2014   Thickened endometrium 03/26/2014   Endometrial polyp 03/26/2014   Constipation 04/11/2013   Fecal occult blood test positive 03/15/2013  Palliative Care Assessment & Plan   Recommendations/Plan:  DNR/DNI-as requested by family ([place])  Continue with current plan of care per medical team  Family remains hopeful for improvement, but also preparing for minimal to no improvement.   CSW working on placement. Family unable to provide care given patient's increased medical needs and their own health concerns per discussion last week.   PMT will continue to support and follow.   Goals of Care and Additional Recommendations:  Limitations on Scope of Treatment: Full Scope Treatment  Code Status:    Code Status Orders  (From admission, onward)         Start     Ordered   10/02/18 0218  Full code  Continuous     10/02/18 0217        Code Status History    This patient has a current code status but no historical code status.   Advance Care Planning Activity      Prognosis:   Unable to determine (Guarded to Poor)  Discharge Planning:  To Be Determined  Care plan was discussed with RN, patient's family [name] & Jimmie).   Thank you for allowing the Palliative Medicine Team to assist in the care of this patient.  Total Time: 40 min.   Greater than 50%  of this time was spent counseling and coordinating care related to the above assessment and plan.  Alda Lea, AGPCNP-BC Palliative Medicine Team  Phone: [phone] Pager: [phone] Amion: Bjorn Pippin    Please contact Palliative Medicine Team phone at [phone] for questions and concerns.

## 2018-11-16 LAB — GLUCOSE, CAPILLARY
Glucose-Capillary: 119 mg/dL — ABNORMAL HIGH (ref 70–99)
Glucose-Capillary: 97 mg/dL (ref 70–99)

## 2018-11-16 LAB — BASIC METABOLIC PANEL
Anion gap: 12 (ref 5–15)
BUN: 97 mg/dL — ABNORMAL HIGH (ref 6–20)
CO2: 24 mmol/L (ref 22–32)
Calcium: 9.4 mg/dL (ref 8.9–10.3)
Chloride: 105 mmol/L (ref 98–111)
Creatinine, Ser: 3.31 mg/dL — ABNORMAL HIGH (ref 0.44–1.00)
GFR calc Af Amer: 17 mL/min — ABNORMAL LOW (ref >60)
GFR calc non Af Amer: 15 mL/min — ABNORMAL LOW (ref >60)
Glucose, Bld: 113 mg/dL — ABNORMAL HIGH (ref 70–99)
Potassium: 5.1 mmol/L (ref 3.5–5.1)
Sodium: 141 mmol/L (ref 135–145)

## 2018-11-16 LAB — ANTI-DNASE B ANTIBODY: Anti-DNAse-B: <78 U/mL (ref 0–120)

## 2018-11-16 NOTE — Progress Notes (Signed)
Physical Therapy Treatment Patient Details Name: Audrey SevereSheila D Bramble MRN: [id number] DOB: Jan 24, 1961 Today's Date: 11/16/2018    History of Present Illness 58 yo female presented with fever, vomiting, weakness.  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  Echo showed vegetation.  Transferred to ICU 8/04 due to respiratory failure.  Underwent AVR with TEE intraoprative on 10/07/18. Extubated 10/08/18.  PEG placed 11/04/18.    PT Comments    Pt performed functional mobility with mod +1 to stand and +2 to pivot.  Pt able to take 1-2 shuffling steps with B HHA.  Pt slow and guarded but following commands better.  Continue to recommend post acute rehab.      Follow Up Recommendations  SNF;LTACH     Equipment Recommendations  (TBA)    Recommendations for Other Services Rehab consult     Precautions / Restrictions Precautions Precautions: Fall;Sternal Precaution Comments: verbally reviewed with patient during functional tasks  Restrictions Weight Bearing Restrictions: No Other Position/Activity Restrictions: sternal precautions     Mobility  Bed Mobility Overal bed mobility: Needs Assistance Bed Mobility: Rolling;Supine to Sit Rolling: Mod assist Sidelying to sit: Mod assist Supine to sit: Mod assist;HOB elevated     General bed mobility comments: VCs for sequencing  Transfers Overall transfer level: Needs assistance Equipment used: Rolling walker (2 wheeled) Transfers: Sit to/from Stand Sit to Stand: Mod assist;From elevated surface Stand pivot transfers: Mod assist;+2 physical assistance;From elevated surface       General transfer comment: Pt stood face to face +1 assistance for pericare.  Pt returned to seated position and +2 assistance used ofr shuffling steps from bed to recliner.  Ambulation/Gait                 Stairs             Wheelchair Mobility    Modified Rankin (Stroke Patients Only)       Balance Overall balance assessment: Needs  assistance   Sitting balance-Leahy Scale: Fair       Standing balance-Leahy Scale: Poor                              Cognition Arousal/Alertness: Awake/alert Behavior During Therapy: Flat affect Overall Cognitive Status: History of cognitive impairments - at baseline Area of Impairment: Following commands;Safety/judgement;Problem solving                   Current Attention Level: Sustained   Following Commands: Follows one step commands inconsistently;Follows one step commands with increased time Safety/Judgement: Decreased awareness of deficits Awareness: Intellectual Problem Solving: Slow processing;Decreased initiation;Difficulty sequencing;Requires verbal cues;Requires tactile cues General Comments: continues to require increased time for processing and initiation       Exercises General Exercises - Lower Extremity Ankle Circles/Pumps: AROM;10 reps;Seated;Both Quad Sets: AROM;Both;10 reps;Supine Heel Slides: AROM;Both;10 reps;Supine Hip ABduction/ADduction: AROM;Both;10 reps;Seated    General Comments        Pertinent Vitals/Pain Pain Assessment: No/denies pain Faces Pain Scale: Hurts little more Pain Location: abdomen Pain Descriptors / Indicators: Grimacing;Discomfort Pain Intervention(s): Monitored during session    Home Living                      Prior Function            PT Goals (current goals can now be found in the care plan section) Acute Rehab PT Goals Patient Stated Goal: none stated today Potential to Achieve  Goals: Fair Progress towards PT goals: Progressing toward goals    Frequency    Min 3X/week      PT Plan Current plan remains appropriate    Co-evaluation              AM-PAC PT "6 Clicks" Mobility   Outcome Measure  Help needed turning from your back to your side while in a flat bed without using bedrails?: A Lot Help needed moving from lying on your back to sitting on the side of a flat  bed without using bedrails?: A Lot Help needed moving to and from a bed to a chair (including a wheelchair)?: A Lot Help needed standing up from a chair using your arms (e.g., wheelchair or bedside chair)?: A Lot Help needed to walk in hospital room?: A Lot Help needed climbing 3-5 steps with a railing? : Total 6 Click Score: 11    End of Session Equipment Utilized During Treatment: Gait belt Activity Tolerance: Patient limited by fatigue Patient left: in chair;with call bell/phone within reach;with chair alarm set;with family/visitor present Nurse Communication: Mobility status PT Visit Diagnosis: Muscle weakness (generalized) (M62.81);Other abnormalities of gait and mobility (R26.89) Pain - Right/Left: (generalized.)     Time: 1005-1035 PT Time Calculation (min) (ACUTE ONLY): 30 min  Charges:  $Gait Training: 8-22 mins $Therapeutic Exercise: 8-22 mins                     Governor Rooks, PTA Acute Rehabilitation Services Pager [phone] Office [phone]     Jourdan Durbin Eli Hose 11/16/2018, 5:54 PM

## 2018-11-16 NOTE — Progress Notes (Signed)
[facility]    NEPHROLOGY PROGRESS NOTE  SUBJECTIVE:  Doesn't feel good today--> can't really articulate.  Palliative care note reviewed- greatly appreciate assistance.    OBJECTIVE:  Vitals:   11/16/18 0800 11/16/18 0900  BP: (!) 151/86 (!) 144/82  Pulse: 87 85  Resp: 20 18  Temp: 98.5 F (36.9 C)   SpO2: 95% 96%    Intake/Output Summary (Last 24 hours) at 11/16/2018 0951 Last data filed at 11/16/2018 0700 Gross per 24 hour  Intake 1409.43 ml  Output 1550 ml  Net -140.57 ml      General: sleeping, easily arousable HEENT: some red rash on cheeks L > R Neck:  No JVD, no adenopathy CV:  Heart RRR  Lungs:  Clear bilaterally Abd:  abd slightly distended, nontender with normal BS Extremities: +3 upper and lower extremity edema Skin: erythematous rash on cheeks- doesn't cross nasolabial folds  MEDICATIONS:  . sodium chloride   Intravenous Once  . amLODipine  5 mg Oral Daily  . calcium-vitamin D  1 tablet Oral Q breakfast  . Chlorhexidine Gluconate Cloth  6 each Topical Daily  . feeding supplement  1 Container Oral TID BM  . feeding supplement (PRO-STAT SUGAR FREE 64)  30 mL Per Tube BID  . FLUoxetine  10 mg Oral Daily  . levothyroxine  150 mcg Oral Q0600  . mouth rinse  15 mL Mouth Rinse BID  . metoCLOPramide (REGLAN) injection  5 mg Intravenous Q12H  . metoprolol tartrate  25 mg Oral BID  . montelukast  10 mg Oral QHS  . multivitamin  15 mL Oral Daily  . rosuvastatin  5 mg Oral q1800  . sodium chloride flush  3 mL Intravenous Q12H  . tamsulosin  0.4 mg Oral Daily       LABS:   CBC Latest Ref Rng & Units 11/14/2018 11/10/2018 11/10/2018  WBC 4.0 - 10.5 K/uL 7.8 8.4 7.2  Hemoglobin 12.0 - 15.0 g/dL 9.0(L) 10.4(L) 9.2(L)  Hematocrit 36.0 - 46.0 % 30.3(L) 33.1(L) 29.5(L)  Platelets 150 - 400 K/uL 96(L) 105(L) 91(L)    CMP Latest Ref Rng & Units 11/15/2018 11/14/2018 11/13/2018  Glucose 70 - 99 mg/dL 161(W142(H) 97 -  BUN 6 - 20 mg/dL 96(E92(H) 45(W89(H) -   Creatinine 0.44 - 1.00 mg/dL 0.98(J3.31(H) 1.91(Y3.48(H) -  Sodium 135 - 145 mmol/L 140 139 -  Potassium 3.5 - 5.1 mmol/L 4.8 5.4(H) 5.3(H)  Chloride 98 - 111 mmol/L 106 103 -  CO2 22 - 32 mmol/L 22 25 -  Calcium 8.9 - 10.3 mg/dL 7.8(G8.8(L) 9.5(A8.8(L) -  Total Protein 6.5 - 8.1 g/dL - - -  Total Bilirubin 0.3 - 1.2 mg/dL - - -  Alkaline Phos 38 - 126 U/L - - -  AST 15 - 41 U/L - - -  ALT 0 - 44 U/L - - -    Lab Results  Component Value Date   CALCIUM 8.8 (L) 11/15/2018   CAION 1.19 10/08/2018   PHOS 6.5 (H) 11/12/2018       Component Value Date/Time   COLORURINE YELLOW 11/12/2018 1850   APPEARANCEUR CLEAR 11/12/2018 1850   LABSPEC 1.010 11/12/2018 1850   PHURINE 6.0 11/12/2018 1850   GLUCOSEU NEGATIVE 11/12/2018 1850   HGBUR MODERATE (A) 11/12/2018 1850   BILIRUBINUR NEGATIVE 11/12/2018 1850   KETONESUR NEGATIVE 11/12/2018 1850   PROTEINUR 100 (A) 11/12/2018 1850   UROBILINOGEN 0.2 04/13/2014 1030   NITRITE NEGATIVE 11/12/2018 1850   LEUKOCYTESUR SMALL (A) 11/12/2018  1850      Component Value Date/Time   PHART 7.430 10/08/2018 0920   PCO2ART 35.6 10/08/2018 0920   PO2ART 70.0 (L) 10/08/2018 0920   HCO3 23.6 10/08/2018 0920   TCO2 25 10/08/2018 0920   ACIDBASEDEF 6.0 (H) 10/08/2018 0307   O2SAT 94.0 10/08/2018 0920       Component Value Date/Time   IRON 107 06/21/2015 1131   TIBC 309 06/21/2015 1131   FERRITIN 220 10/02/2018 0809   IRONPCTSAT 35 06/21/2015 1131       ASSESSMENT/PLAN:     1.  Acute kidney injury.  Remains nonoliguric.  Cr in the mid 3s.  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  Serologies and complements negative.  She may need a biopsy if cr doesn't improve--> labs are pending for today. then will need to differentiate (AIN would warrant consideration of steroids).  I will call family to discuss risks/ benefits of biopsy and what information may be gained from it.    2.  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  3.  Severe hypoalbuminemia.  Continue  enteral nutrition.  4.  Cognitive delay.  5.  Anemia.  Hemoglobin stable for now  6.  Hyperkalemia.  Lokelma prn.    Madelon Lips MD

## 2018-11-16 NOTE — Progress Notes (Signed)
Pt transferred to 23E-21 from [facility] via wheelchair with RN. Pt transferred to bed. Pt given CHG bath. Tele applied, CCMD notified. PT oriented to call bell, bed and room. Call bell within reach. VSS. Will continue to monitor.  Amanda Cockayne, RN

## 2018-11-16 NOTE — Progress Notes (Addendum)
[facility] DAILY ICU PROGRESS NOTE                   [address]            [place] [address]          [phone]   13 Days Post-Op Procedure(s) (LRB): ESOPHAGOGASTRODUODENOSCOPY (EGD) WITH PROPOFOL (N/A) BIOPSY  Total Length of Stay:  LOS: 46 days   Subjective:  Patient states not doing well.  She has no specific complaints denying nausea, abdominal pain, chest pain, shortness of breath.  Her family has made her DNR/DNI  Objective: Vital signs in last 24 hours: Temp:  [98 F (36.7 C)-98.6 F (37 C)] 98.5 F (36.9 C) (09/16 0800) Pulse Rate:  [75-87] 87 (09/16 0800) Cardiac Rhythm: Normal sinus rhythm (09/16 0400) Resp:  [16-26] 20 (09/16 0800) BP: (125-151)/(69-87) 151/86 (09/16 0800) SpO2:  [93 %-97 %] 95 % (09/16 0800) Weight:  [95 kg] 95 kg (09/16 0600)  Filed Weights   11/14/18 0500 11/15/18 0500 11/16/18 0600  Weight: 93.6 kg 95.9 kg 95 kg    Weight change: -0.9 kg   Intake/Output from previous day: 09/15 0701 - 09/16 0700 In: 1492.4 [I.V.:297.4; NG/GT:1105] Out: 1650 [Urine:1650]  Current Meds: Scheduled Meds: . sodium chloride   Intravenous Once  . amLODipine  5 mg Oral Daily  . calcium-vitamin D  1 tablet Oral Q breakfast  . Chlorhexidine Gluconate Cloth  6 each Topical Daily  . feeding supplement  1 Container Oral TID BM  . feeding supplement (PRO-STAT SUGAR FREE 64)  30 mL Per Tube BID  . FLUoxetine  10 mg Oral Daily  . levothyroxine  150 mcg Oral Q0600  . mouth rinse  15 mL Mouth Rinse BID  . metoCLOPramide (REGLAN) injection  5 mg Intravenous Q12H  . metoprolol tartrate  25 mg Oral BID  . montelukast  10 mg Oral QHS  . multivitamin  15 mL Oral Daily  . rosuvastatin  5 mg Oral q1800  . sodium chloride flush  3 mL Intravenous Q12H  . tamsulosin  0.4 mg Oral Daily   Continuous Infusions: . sodium chloride 10 mL/hr at 11/16/18 0700  .  ceFAZolin (ANCEF) IV 2 g (11/15/18 2342)  . feeding supplement (NEPRO CARB STEADY) 65 mL/hr at  11/16/18 0700   PRN Meds:.sodium chloride, acetaminophen, bisacodyl **OR** bisacodyl, lactulose, magic mouthwash, metoprolol tartrate, morphine injection, ondansetron (ZOFRAN) IV, oxyCODONE, sodium chloride flush, traMADol  General appearance: alert, cooperative and no distress Heart: regular rate and rhythm Lungs: diminished breath sounds bibasilar Abdomen: + distention, nontender, + BS Extremities: edema trace to 1+ Wound: clean and dry, sternotomy well healed  Lab Results: CBC: Recent Labs    11/14/18 0525  WBC 7.8  HGB 9.0*  HCT 30.3*  PLT 96*   BMET:  Recent Labs    11/14/18 0525 11/15/18 0941  NA 139 140  K 5.4* 4.8  CL 103 106  CO2 25 22  GLUCOSE 97 142*  BUN 89* 92*  CREATININE 3.48* 3.31*  CALCIUM 8.8* 8.8*    CMET: Lab Results  Component Value Date   WBC 7.8 11/14/2018   HGB 9.0 (L) 11/14/2018   HCT 30.3 (L) 11/14/2018   PLT 96 (L) 11/14/2018   GLUCOSE 142 (H) 11/15/2018   TRIG 159 (H) 10/05/2018   ALT <5 11/07/2018   AST 22 11/07/2018   NA 140 11/15/2018   K 4.8 11/15/2018   CL 106 11/15/2018   CREATININE  3.31 (H) 11/15/2018   BUN 92 (H) 11/15/2018   CO2 22 11/15/2018   TSH 19.826 (H) 10/30/2018   INR 1.3 (H) 11/03/2018      PT/INR: No results for input(s): LABPROT, INR in the last 72 hours. Radiology: No results found.   Assessment/Plan: S/P Procedure(s) (LRB): ESOPHAGOGASTRODUODENOSCOPY (EGD) WITH PROPOFOL (N/A) BIOPSY  1. CV- NSR, BP stable- in Norvasc 2. Pulm- no acute issues, off oxygen 3. Renal- creatinine stable yesterday at 3.31, no repeat drawn today, have placed order for BMET... AIN per Nephrology 4. GI- little to no oral intake, nurse states she drank some grape juice this morning and then started to gag, continue tube feeds 5. ID- remains afebrile, on ABX per ID for endocarditis, stop date is tomorrow 9/17 6. Deconditioning- works with PT/OT, continues to have no bed offers 7. Dispo- patient stable, chest and face rash  remain stable, patient continues to have little to no oral intake, poor communication with staff most related to cognitive ability, Nephrology managing AIN, ABX completes tomorrow, family has now made patient DNR/DNI... placement continues to be an issue    Lowella Dandyrin Barrett 11/16/2018 8:24 AM   Creat stable, appreciate nephrology recs 1 more day of antibiotics Now DNR/DNI Continuing to plan dispo Corliss SkainsHarrell O Braya Habermehl

## 2018-11-16 NOTE — Progress Notes (Signed)
Report called to [facility] of 4E. Patient to be transferred to 4E room 21.

## 2018-11-17 LAB — BASIC METABOLIC PANEL
Anion gap: 10 (ref 5–15)
BUN: 97 mg/dL — ABNORMAL HIGH (ref 6–20)
CO2: 27 mmol/L (ref 22–32)
Calcium: 9.4 mg/dL (ref 8.9–10.3)
Chloride: 106 mmol/L (ref 98–111)
Creatinine, Ser: 3.3 mg/dL — ABNORMAL HIGH (ref 0.44–1.00)
GFR calc Af Amer: 17 mL/min — ABNORMAL LOW (ref >60)
GFR calc non Af Amer: 15 mL/min — ABNORMAL LOW (ref >60)
Glucose, Bld: 124 mg/dL — ABNORMAL HIGH (ref 70–99)
Potassium: 4.7 mmol/L (ref 3.5–5.1)
Sodium: 143 mmol/L (ref 135–145)

## 2018-11-17 LAB — CBC
HCT: 30 % — ABNORMAL LOW (ref 36.0–46.0)
Hemoglobin: 8.8 g/dL — ABNORMAL LOW (ref 12.0–15.0)
MCH: 29.3 pg (ref 26.0–34.0)
MCHC: 29.3 g/dL — ABNORMAL LOW (ref 30.0–36.0)
MCV: 100 fL (ref 80.0–100.0)
Platelets: 88 10*3/uL — ABNORMAL LOW (ref 150–400)
RBC: 3 MIL/uL — ABNORMAL LOW (ref 3.87–5.11)
RDW: 15.8 % — ABNORMAL HIGH (ref 11.5–15.5)
WBC: 9.4 10*3/uL (ref 4.0–10.5)
nRBC: 0 % (ref 0.0–0.2)

## 2018-11-17 LAB — PROTEIN / CREATININE RATIO, URINE
Creatinine, Urine: 33.77 mg/dL
Protein Creatinine Ratio: 6.51 mg/mg{Cre} — ABNORMAL HIGH (ref 0.00–0.15)
Total Protein, Urine: 220 mg/dL

## 2018-11-17 LAB — GLUCOSE, CAPILLARY
Glucose-Capillary: 122 mg/dL — ABNORMAL HIGH (ref 70–99)
Glucose-Capillary: 106 mg/dL — ABNORMAL HIGH (ref 70–99)

## 2018-11-17 NOTE — Progress Notes (Addendum)
      [address]       [place] [address]             [phone]      14 Days Post-Op Procedure(s) (LRB): ESOPHAGOGASTRODUODENOSCOPY (EGD) WITH PROPOFOL (N/A) BIOPSY  Subjective:  Up in bed.  She drank some grape juice this morning.  She states she has nausea this morning.  Objective: Vital signs in last 24 hours: Temp:  [98.2 F (36.8 C)-98.7 F (37.1 C)] 98.7 F (37.1 C) (09/17 0019) Pulse Rate:  [73-89] 73 (09/17 0019) Cardiac Rhythm: Normal sinus rhythm (09/16 1953) Resp:  [13-35] 35 (09/17 0019) BP: (141-155)/(75-86) 147/81 (09/17 0019) SpO2:  [93 %-99 %] 99 % (09/17 0019) FiO2 (%):  [2 %] 2 % (09/16 2000) Weight:  [93.5 kg] 93.5 kg (09/17 0446)  Intake/Output from previous day: 09/16 0701 - 09/17 0700 In: 554.6 [P.O.:480; I.V.:74.6] Out: 900 [Urine:900]  General appearance: alert, cooperative and no distress Heart: regular rate and rhythm Lungs: diminished breath sounds bibasilar Abdomen: Soft, distended, hypoactive BS Extremities: edema trace, macular rash along bilateral LE, arms, face Wound: sternotomy well healed  Lab Results: Recent Labs    11/17/18 0447  WBC 9.4  HGB 8.8*  HCT 30.0*  PLT 88*   BMET:  Recent Labs    11/16/18 0945 11/17/18 0447  NA 141 143  K 5.1 4.7  CL 105 106  CO2 24 27  GLUCOSE 113* 124*  BUN 97* 97*  CREATININE 3.31* 3.30*  CALCIUM 9.4 9.4    PT/INR: No results for input(s): LABPROT, INR in the last 72 hours. ABG    Component Value Date/Time   PHART 7.430 10/08/2018 0920   HCO3 23.6 10/08/2018 0920   TCO2 25 10/08/2018 0920   ACIDBASEDEF 6.0 (H) 10/08/2018 0307   O2SAT 94.0 10/08/2018 0920   CBG (last 3)  Recent Labs    11/16/18 0811 11/16/18 2119 11/17/18 0431  GLUCAP 119* 97 122*    Assessment/Plan: S/P Procedure(s) (LRB): ESOPHAGOGASTRODUODENOSCOPY (EGD) WITH PROPOFOL (N/A) BIOPSY  1. CV- NSR, BP remains controlled- continue Lopressor, Norvasc 2. Pulm- no acute issues,  continue IS 3. Renal- creatinine stable at 3.30, AIN per Nephrology 4. GI- continues to have poor oral intake, on tube feeds at night, did drink some grape juice this morning 5. ID- afebrile, last day of ABX today 6. Deconditioning PT/OT- may be better to explore permanent placement in a nursing home 7. Dispo- patient stable, issues remain stable, AIN per Nephrology, continue to encourage oral intake, tube feeds, remains an issues   LOS: 47 days    Erin Barrett 11/17/2018   Creat stable dispo planning Jennavie Martinek O Landers Prajapati

## 2018-11-17 NOTE — Progress Notes (Signed)
[facility]    NEPHROLOGY PROGRESS NOTE  SUBJECTIVE:  Sitting up in bed, aunt at bedside.    OBJECTIVE:  Vitals:   11/17/18 0019 11/17/18 1031  BP: (!) 147/81 (!) 150/82  Pulse: 73 87  Resp: (!) 35   Temp: 98.7 F (37.1 C)   SpO2: 99%     Intake/Output Summary (Last 24 hours) at 11/17/2018 1309 Last data filed at 11/17/2018 1045 Gross per 24 hour  Intake 484.63 ml  Output 900 ml  Net -415.37 ml      General: sleeping, easily arousable HEENT: some red rash on cheeks L > R Neck:  No JVD, no adenopathy CV:  Heart RRR  Lungs:  Clear bilaterally Abd:  abd slightly distended, nontender with normal BS Extremities: +3 upper and lower extremity edema Skin: erythematous rash on cheeks- doesn't cross nasolabial folds  MEDICATIONS:  . sodium chloride   Intravenous Once  . amLODipine  5 mg Oral Daily  . calcium-vitamin D  1 tablet Oral Q breakfast  . Chlorhexidine Gluconate Cloth  6 each Topical Daily  . feeding supplement  1 Container Oral TID BM  . feeding supplement (PRO-STAT SUGAR FREE 64)  30 mL Per Tube BID  . FLUoxetine  10 mg Oral Daily  . levothyroxine  150 mcg Oral Q0600  . mouth rinse  15 mL Mouth Rinse BID  . metoCLOPramide (REGLAN) injection  5 mg Intravenous Q12H  . metoprolol tartrate  25 mg Oral BID  . montelukast  10 mg Oral QHS  . multivitamin  15 mL Oral Daily  . rosuvastatin  5 mg Oral q1800  . sodium chloride flush  3 mL Intravenous Q12H  . tamsulosin  0.4 mg Oral Daily       LABS:   CBC Latest Ref Rng & Units 11/17/2018 11/14/2018 11/10/2018  WBC 4.0 - 10.5 K/uL 9.4 7.8 8.4  Hemoglobin 12.0 - 15.0 g/dL 8.8(L) 9.0(L) 10.4(L)  Hematocrit 36.0 - 46.0 % 30.0(L) 30.3(L) 33.1(L)  Platelets 150 - 400 K/uL 88(L) 96(L) 105(L)    CMP Latest Ref Rng & Units 11/17/2018 11/16/2018 11/15/2018  Glucose 70 - 99 mg/dL 124(H) 113(H) 142(H)  BUN 6 - 20 mg/dL 97(H) 97(H) 92(H)  Creatinine 0.44 - 1.00 mg/dL 3.30(H) 3.31(H) 3.31(H)  Sodium 135 - 145  mmol/L 143 141 140  Potassium 3.5 - 5.1 mmol/L 4.7 5.1 4.8  Chloride 98 - 111 mmol/L 106 105 106  CO2 22 - 32 mmol/L 27 24 22   Calcium 8.9 - 10.3 mg/dL 9.4 9.4 8.8(L)  Total Protein 6.5 - 8.1 g/dL - - -  Total Bilirubin 0.3 - 1.2 mg/dL - - -  Alkaline Phos 38 - 126 U/L - - -  AST 15 - 41 U/L - - -  ALT 0 - 44 U/L - - -    Lab Results  Component Value Date   CALCIUM 9.4 11/17/2018   CAION 1.19 10/08/2018   PHOS 6.5 (H) 11/12/2018       Component Value Date/Time   COLORURINE YELLOW 11/12/2018 1850   APPEARANCEUR CLEAR 11/12/2018 1850   LABSPEC 1.010 11/12/2018 1850   PHURINE 6.0 11/12/2018 1850   GLUCOSEU NEGATIVE 11/12/2018 1850   HGBUR MODERATE (A) 11/12/2018 1850   BILIRUBINUR NEGATIVE 11/12/2018 1850   KETONESUR NEGATIVE 11/12/2018 1850   PROTEINUR 100 (A) 11/12/2018 1850   UROBILINOGEN 0.2 04/13/2014 1030   NITRITE NEGATIVE 11/12/2018 1850   LEUKOCYTESUR SMALL (A) 11/12/2018 1850      Component Value  Date/Time   PHART 7.430 10/08/2018 0920   PCO2ART 35.6 10/08/2018 0920   PO2ART 70.0 (L) 10/08/2018 0920   HCO3 23.6 10/08/2018 0920   TCO2 25 10/08/2018 0920   ACIDBASEDEF 6.0 (H) 10/08/2018 0307   O2SAT 94.0 10/08/2018 0920       Component Value Date/Time   IRON 107 06/21/2015 1131   TIBC 309 06/21/2015 1131   FERRITIN 220 10/02/2018 0809   IRONPCTSAT 35 06/21/2015 1131       ASSESSMENT/PLAN:     1.  Acute kidney injury.  Remains nonoliguric.  Cr in the mid 3s.  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  Serologies and complements negative.  Discussed with aunt Kendal HymenBonnie at bedside that biopsy is warranted--> she is in agreement.  I have left a message on [name]'s phone to call back to discuss.  If all OK then we will proceed. Will send UP/C as well.   2.  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  3.  Severe hypoalbuminemia.  Continue enteral nutrition.  4.  Cognitive delay.  5.  Anemia.  Hemoglobin stable for now  6.  Hyperkalemia.  Lokelma  prn.    Bufford ButtnerElizabeth Kamarah Bilotta MD

## 2018-11-18 ENCOUNTER — Inpatient Hospital Stay (HOSPITAL_COMMUNITY): Payer: Medicaid Other

## 2018-11-18 LAB — CBC
HCT: 28.3 % — ABNORMAL LOW (ref 36.0–46.0)
Hemoglobin: 8.9 g/dL — ABNORMAL LOW (ref 12.0–15.0)
MCH: 30.8 pg (ref 26.0–34.0)
MCHC: 31.4 g/dL (ref 30.0–36.0)
MCV: 97.9 fL (ref 80.0–100.0)
Platelets: 99 10*3/uL — ABNORMAL LOW (ref 150–400)
RBC: 2.89 MIL/uL — ABNORMAL LOW (ref 3.87–5.11)
RDW: 16.1 % — ABNORMAL HIGH (ref 11.5–15.5)
WBC: 14 10*3/uL — ABNORMAL HIGH (ref 4.0–10.5)
nRBC: 0 % (ref 0.0–0.2)

## 2018-11-18 LAB — BASIC METABOLIC PANEL
Anion gap: 12 (ref 5–15)
BUN: 93 mg/dL — ABNORMAL HIGH (ref 6–20)
CO2: 27 mmol/L (ref 22–32)
Calcium: 9.7 mg/dL (ref 8.9–10.3)
Chloride: 103 mmol/L (ref 98–111)
Creatinine, Ser: 3.22 mg/dL — ABNORMAL HIGH (ref 0.44–1.00)
GFR calc Af Amer: 18 mL/min — ABNORMAL LOW (ref >60)
GFR calc non Af Amer: 15 mL/min — ABNORMAL LOW (ref >60)
Glucose, Bld: 116 mg/dL — ABNORMAL HIGH (ref 70–99)
Potassium: 4.6 mmol/L (ref 3.5–5.1)
Sodium: 142 mmol/L (ref 135–145)

## 2018-11-18 LAB — GLUCOSE, CAPILLARY
Glucose-Capillary: 97 mg/dL (ref 70–99)
Glucose-Capillary: 101 mg/dL — ABNORMAL HIGH (ref 70–99)

## 2018-11-18 MED ORDER — NEPRO/CARBSTEADY PO LIQD
910.0000 mL | ORAL | Status: DC
  Administered 2018-11-18: 910 mL
  Filled 2018-11-18: qty 948

## 2018-11-18 MED ORDER — FUROSEMIDE 10 MG/ML IJ SOLN
40.0000 mg | Freq: Once | INTRAMUSCULAR | Status: AC
  Administered 2018-11-18: 19:00:00 40 mg via INTRAVENOUS
  Filled 2018-11-18: qty 4

## 2018-11-18 NOTE — Progress Notes (Signed)
[facility]    NEPHROLOGY PROGRESS NOTE  SUBJECTIVE:  Not feeling good today but can't articulate- per PT notes had some n/v earlier today.  WBC ct up to 14, Tmax of 100.7 noted earlier today.    OBJECTIVE:  Vitals:   11/18/18 1410 11/18/18 1700  BP: (!) 149/78 (!) 141/84  Pulse: 88 90  Resp: (!) 24 20  Temp: (!) 100.7 F (38.2 C) 99.9 F (37.7 C)  SpO2: 95% 96%    Intake/Output Summary (Last 24 hours) at 11/18/2018 1828 Last data filed at 11/17/2018 2300 Gross per 24 hour  Intake 260 ml  Output 160 ml  Net 100 ml      General: sleeping, easily arousable HEENT: some red rash on cheeks L > R Neck:  + JVD, no adenopathy CV:  Heart RRR  Lungs:  Clear bilaterally Abd:  abd slightly distended, nontender with normal BS Extremities: +3 upper and lower extremity edema Skin: erythematous rash on cheeks- doesn't cross nasolabial folds  MEDICATIONS:  . sodium chloride   Intravenous Once  . amLODipine  5 mg Oral Daily  . calcium-vitamin D  1 tablet Oral Q breakfast  . Chlorhexidine Gluconate Cloth  6 each Topical Daily  . feeding supplement  1 Container Oral TID BM  . feeding supplement (PRO-STAT SUGAR FREE 64)  30 mL Per Tube BID  . FLUoxetine  10 mg Oral Daily  . furosemide  40 mg Intravenous Once  . levothyroxine  150 mcg Oral Q0600  . mouth rinse  15 mL Mouth Rinse BID  . metoCLOPramide (REGLAN) injection  5 mg Intravenous Q12H  . metoprolol tartrate  25 mg Oral BID  . montelukast  10 mg Oral QHS  . multivitamin  15 mL Oral Daily  . rosuvastatin  5 mg Oral q1800  . sodium chloride flush  3 mL Intravenous Q12H  . tamsulosin  0.4 mg Oral Daily       LABS:   CBC Latest Ref Rng & Units 11/18/2018 11/17/2018 11/14/2018  WBC 4.0 - 10.5 K/uL 14.0(H) 9.4 7.8  Hemoglobin 12.0 - 15.0 g/dL 8.9(L) 8.8(L) 9.0(L)  Hematocrit 36.0 - 46.0 % 28.3(L) 30.0(L) 30.3(L)  Platelets 150 - 400 K/uL 99(L) 88(L) 96(L)    CMP Latest Ref Rng & Units 11/18/2018 11/17/2018  11/16/2018  Glucose 70 - 99 mg/dL 116(H) 124(H) 113(H)  BUN 6 - 20 mg/dL 93(H) 97(H) 97(H)  Creatinine 0.44 - 1.00 mg/dL 3.22(H) 3.30(H) 3.31(H)  Sodium 135 - 145 mmol/L 142 143 141  Potassium 3.5 - 5.1 mmol/L 4.6 4.7 5.1  Chloride 98 - 111 mmol/L 103 106 105  CO2 22 - 32 mmol/L 27 27 24   Calcium 8.9 - 10.3 mg/dL 9.7 9.4 9.4  Total Protein 6.5 - 8.1 g/dL - - -  Total Bilirubin 0.3 - 1.2 mg/dL - - -  Alkaline Phos 38 - 126 U/L - - -  AST 15 - 41 U/L - - -  ALT 0 - 44 U/L - - -    Lab Results  Component Value Date   CALCIUM 9.7 11/18/2018   CAION 1.19 10/08/2018   PHOS 6.5 (H) 11/12/2018       Component Value Date/Time   COLORURINE YELLOW 11/12/2018 [place] 11/12/2018 1850   LABSPEC 1.010 11/12/2018 1850   PHURINE 6.0 11/12/2018 1850   GLUCOSEU NEGATIVE 11/12/2018 1850   HGBUR MODERATE (A) 11/12/2018 1850   BILIRUBINUR NEGATIVE 11/12/2018 [place] 11/12/2018 1850  PROTEINUR 100 (A) 11/12/2018 1850   UROBILINOGEN 0.2 04/13/2014 1030   NITRITE NEGATIVE 11/12/2018 1850   LEUKOCYTESUR SMALL (A) 11/12/2018 1850      Component Value Date/Time   PHART 7.430 10/08/2018 0920   PCO2ART 35.6 10/08/2018 0920   PO2ART 70.0 (L) 10/08/2018 0920   HCO3 23.6 10/08/2018 0920   TCO2 25 10/08/2018 0920   ACIDBASEDEF 6.0 (H) 10/08/2018 0307   O2SAT 94.0 10/08/2018 0920       Component Value Date/Time   IRON 107 06/21/2015 1131   TIBC 309 06/21/2015 1131   FERRITIN 220 10/02/2018 0809   IRONPCTSAT 35 06/21/2015 1131       ASSESSMENT/PLAN:     1.  Acute kidney injury.  Remains nonoliguric.  Cr in the mid 3s.  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  Serologies and complements negative.  Discussed with aunt Kendal HymenBonnie at bedside that biopsy is warranted--> she is in agreement.  UP/C 6.5, no history of DM.  Sending A1c.  Continuing to try to make contact with aunt [name]--> would like to biopsy during this admission if possible; will have to sort out new  low-grade temperature and WBC up to 14 on labs today first.     2.  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 Completed antibiotics  3.  Severe hypoalbuminemia.  Continue enteral nutrition.  4.  Cognitive delay.  5.  Anemia.  Hemoglobin stable for now  6.  Hyperkalemia.  Lokelma prn.  7.  Anasarca: will try 40 IV Lasix today.  Alb low so expect continued third spacing and don't expect it to completely resolve  8.  Leukocytosis/ low-grade fever: WBC ct up to 14 this AM and Tmax 100.7 blood cultures ordered, CXR, and UA + culture ordered.      Bufford ButtnerElizabeth Raeonna Milo MD

## 2018-11-18 NOTE — Progress Notes (Signed)
      [address]       [place] [address]             [phone]      15 Days Post-Op Procedure(s) (LRB): ESOPHAGOGASTRODUODENOSCOPY (EGD) WITH PROPOFOL (N/A) BIOPSY   Subjective:  No new complaints.    Objective: Vital signs in last 24 hours: Temp:  [98.6 F (37 C)-99 F (37.2 C)] 98.8 F (37.1 C) (09/18 0500) Pulse Rate:  [78-87] 78 (09/18 0500) Cardiac Rhythm: Normal sinus rhythm (09/17 2138) Resp:  [14-21] 16 (09/18 0500) BP: (140-151)/(81-82) 148/82 (09/18 0500) SpO2:  [97 %-98 %] 97 % (09/18 0500) Weight:  [92.7 kg] 92.7 kg (09/18 0500)  Intake/Output from previous day: 09/17 0701 - 09/18 0700 In: 550 [P.O.:280; I.V.:10; NG/GT:260] Out: 860 [Urine:860]  General appearance: alert, cooperative and no distress Heart: regular rate and rhythm Lungs: diminished breath sounds bibasilar Abdomen: soft, non-tender; bowel sounds normal; no masses,  no organomegaly Extremities: edema trace Wound: clean and dry  Lab Results: Recent Labs    11/17/18 0447  WBC 9.4  HGB 8.8*  HCT 30.0*  PLT 88*   BMET:  Recent Labs    11/16/18 0945 11/17/18 0447  NA 141 143  K 5.1 4.7  CL 105 106  CO2 24 27  GLUCOSE 113* 124*  BUN 97* 97*  CREATININE 3.31* 3.30*  CALCIUM 9.4 9.4    PT/INR: No results for input(s): LABPROT, INR in the last 72 hours. ABG    Component Value Date/Time   PHART 7.430 10/08/2018 0920   HCO3 23.6 10/08/2018 0920   TCO2 25 10/08/2018 0920   ACIDBASEDEF 6.0 (H) 10/08/2018 0307   O2SAT 94.0 10/08/2018 0920   CBG (last 3)  Recent Labs    11/17/18 0431 11/17/18 2146 11/18/18 0507  GLUCAP 122* 106* 97    Assessment/Plan: S/P Procedure(s) (LRB): ESOPHAGOGASTRODUODENOSCOPY (EGD) WITH PROPOFOL (N/A) BIOPSY  1. CV- remains hemodynamically stable on Lopressor, Norvasc 2. Pulm- no acute issues, continue IS 3. Renal- labs not ordered, will place, AIN.[name] following 4. GI - continue tube feeds, oral intake remains  poor 5. ID- remains afebrile ABX completed for Endocarditis 6. Deconditioning- PT/OT... patient placement still remains an issue 7. Dispo- patient stable, renal issues per nephrology, continue PT/OT, disposition remains difficult   LOS: 48 days    Ellwood Handler 11/18/2018

## 2018-11-18 NOTE — Progress Notes (Signed)
Physical Therapy Treatment Patient Details Name: Audrey SevereSheila D Malina MRN: [id number] DOB: 1960/11/25 Today's Date: 11/18/2018    History of Present Illness 58 yo female presented with fever, vomiting, weakness.  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  Echo showed vegetation.  Transferred to ICU 8/04 due to respiratory failure.  Underwent AVR with TEE intraoprative on 10/07/18. Extubated 10/08/18.  PEG placed 11/04/18.    PT Comments    Pt agreeable to work with therapy, especially when found to be wet and need for linens to be changed. Pt reports abdominal pain at onset of therapy. Pt able to initiate movement with bed mobility, and transfers. Pt requires modAx2 for bed mobility, and sit>stand to RW and with 3 Musketeer assist. Pt able to take lateral steps towards head of bed. Once seated back on EoB pt belched, returned to bed and vomited twice. RN notified and in room with anti-nausea medicine. D/c plans remain appropriate. Will reassess goals with next session.     Follow Up Recommendations  SNF;LTACH     Equipment Recommendations  Other (comment)(TBA)       Precautions / Restrictions Precautions Precautions: Fall;Sternal Precaution Comments: verbally reviewed with patient during functional tasks  Restrictions Weight Bearing Restrictions: No Other Position/Activity Restrictions: sternal precautions     Mobility  Bed Mobility Overal bed mobility: Needs Assistance Bed Mobility: Supine to Sit Rolling: +2 for physical assistance;Mod assist Sidelying to sit: Mod assist;+2 for physical assistance Supine to sit: Mod assist;+2 for physical assistance;HOB elevated     General bed mobility comments: pt initating movement of LEs towards EOB, but requires mod assist to fully complete motion, assist trunk upright and scoot hips towards EOB using pads   Transfers Overall transfer level: Needs assistance Equipment used: Rolling walker (2 wheeled);2 person hand held assist Transfers: Sit  to/from UGI CorporationStand;Stand Pivot Transfers Sit to Stand: Mod assist;+2 physical assistance;From elevated surface;Max assist         General transfer comment: maxAx2 for initial sit>stand, modAx2 for subsequent sit>stand to RW and with 3 musketeer assist, encouraged pt to maintain standing and able to provide more effort before fatigue and sitting  Ambulation/Gait Ambulation/Gait assistance: Max assist;+2 physical assistance Gait Distance (Feet): 2 Feet Assistive device: 2 person hand held assist Gait Pattern/deviations: Step-to pattern;Decreased step length - right;Decreased step length - left Gait velocity: slowed Gait velocity interpretation: <1.31 ft/sec, indicative of household ambulator General Gait Details: utilized 3 musketeer assist to take steps towards head of bed.          Balance Overall balance assessment: Needs assistance Sitting-balance support: No upper extremity supported;Feet supported Sitting balance-Leahy Scale: Fair Sitting balance - Comments: min guard for dynamic EOB, supervision for static sitting Postural control: Posterior lean;Left lateral lean Standing balance support: Bilateral upper extremity supported;During functional activity Standing balance-Leahy Scale: Poor                              Cognition Arousal/Alertness: Awake/alert Behavior During Therapy: Flat affect Overall Cognitive Status: History of cognitive impairments - at baseline Area of Impairment: Attention;Following commands;Safety/judgement;Problem solving;Awareness                   Current Attention Level: Sustained   Following Commands: Follows one step commands inconsistently;Follows one step commands with increased time Safety/Judgement: Decreased awareness of deficits Awareness: Intellectual Problem Solving: Slow processing;Decreased initiation;Requires verbal cues;Requires tactile cues General Comments: continues to require increased time for processing and  initiation          General Comments General comments (skin integrity, edema, etc.): VSS on 2L O2 via [place], on getting back to bed pt belched and proceeded to vomit, returned to bed and vomited again      Pertinent Vitals/Pain Pain Assessment: Faces Faces Pain Scale: Hurts little more Pain Location: abdomen Pain Descriptors / Indicators: Grimacing;Discomfort Pain Intervention(s): Limited activity within patient's tolerance;Monitored during session;Repositioned           PT Goals (current goals can now be found in the care plan section) Acute Rehab PT Goals Patient Stated Goal: none stated today PT Goal Formulation: Patient unable to participate in goal setting Time For Goal Achievement: 11/19/18 Potential to Achieve Goals: Fair Progress towards PT goals: Progressing toward goals    Frequency    Min 3X/week      PT Plan Current plan remains appropriate       AM-PAC PT "6 Clicks" Mobility   Outcome Measure  Help needed turning from your back to your side while in a flat bed without using bedrails?: A Lot Help needed moving from lying on your back to sitting on the side of a flat bed without using bedrails?: A Lot Help needed moving to and from a bed to a chair (including a wheelchair)?: Total Help needed standing up from a chair using your arms (e.g., wheelchair or bedside chair)?: A Lot Help needed to walk in hospital room?: Total Help needed climbing 3-5 steps with a railing? : Total 6 Click Score: 9    End of Session Equipment Utilized During Treatment: Gait belt;Oxygen Activity Tolerance: Patient limited by fatigue Patient left: in bed;with bed alarm set;with nursing/sitter in room Nurse Communication: Mobility status PT Visit Diagnosis: Muscle weakness (generalized) (M62.81);Other abnormalities of gait and mobility (R26.89) Pain - Right/Left: (generalized) Pain - part of body: (abdomen)     Time: 3007-6226 PT Time Calculation (min) (ACUTE ONLY): 31  min  Charges:  $Gait Training: 8-22 mins $Therapeutic Activity: 8-22 mins                     Abyan Cadman B. Migdalia Dk PT, DPT Acute Rehabilitation Services Pager [phone] Office [phone]    [place] 11/18/2018, 1:24 PM

## 2018-11-19 LAB — RENAL FUNCTION PANEL
Albumin: 1.8 g/dL — ABNORMAL LOW (ref 3.5–5.0)
Anion gap: 11 (ref 5–15)
BUN: 96 mg/dL — ABNORMAL HIGH (ref 6–20)
CO2: 26 mmol/L (ref 22–32)
Calcium: 9.8 mg/dL (ref 8.9–10.3)
Chloride: 103 mmol/L (ref 98–111)
Creatinine, Ser: 3.11 mg/dL — ABNORMAL HIGH (ref 0.44–1.00)
GFR calc Af Amer: 18 mL/min — ABNORMAL LOW (ref >60)
GFR calc non Af Amer: 16 mL/min — ABNORMAL LOW (ref >60)
Glucose, Bld: 124 mg/dL — ABNORMAL HIGH (ref 70–99)
Phosphorus: 5.5 mg/dL — ABNORMAL HIGH (ref 2.5–4.6)
Potassium: 4.2 mmol/L (ref 3.5–5.1)
Sodium: 140 mmol/L (ref 135–145)

## 2018-11-19 LAB — CBC
HCT: 28.2 % — ABNORMAL LOW (ref 36.0–46.0)
Hemoglobin: 8.5 g/dL — ABNORMAL LOW (ref 12.0–15.0)
MCH: 29.6 pg (ref 26.0–34.0)
MCHC: 30.1 g/dL (ref 30.0–36.0)
MCV: 98.3 fL (ref 80.0–100.0)
Platelets: 102 10*3/uL — ABNORMAL LOW (ref 150–400)
RBC: 2.87 MIL/uL — ABNORMAL LOW (ref 3.87–5.11)
RDW: 16 % — ABNORMAL HIGH (ref 11.5–15.5)
WBC: 16.7 10*3/uL — ABNORMAL HIGH (ref 4.0–10.5)
nRBC: 0 % (ref 0.0–0.2)

## 2018-11-19 LAB — GLUCOSE, CAPILLARY
Glucose-Capillary: 122 mg/dL — ABNORMAL HIGH (ref 70–99)
Glucose-Capillary: 128 mg/dL — ABNORMAL HIGH (ref 70–99)
Glucose-Capillary: 104 mg/dL — ABNORMAL HIGH (ref 70–99)
Glucose-Capillary: 96 mg/dL (ref 70–99)

## 2018-11-19 LAB — HEMOGLOBIN A1C
Hgb A1c MFr Bld: 4.9 % (ref 4.8–5.6)
Mean Plasma Glucose: 93.93 mg/dL

## 2018-11-19 MED ORDER — FUROSEMIDE 10 MG/ML IJ SOLN
40.0000 mg | Freq: Once | INTRAMUSCULAR | Status: AC
  Administered 2018-11-19: 40 mg via INTRAVENOUS
  Filled 2018-11-19: qty 4

## 2018-11-19 NOTE — Plan of Care (Signed)
  Problem: Health Behavior/Discharge Planning: Goal: Ability to manage health-related needs will improve Outcome: Progressing   Problem: Clinical Measurements: Goal: Ability to maintain clinical measurements within normal limits will improve Outcome: Progressing Goal: Will remain free from infection Outcome: Progressing Goal: Diagnostic test results will improve Outcome: Progressing Goal: Cardiovascular complication will be avoided Outcome: Progressing   Problem: Activity: Goal: Risk for activity intolerance will decrease Outcome: Progressing   Problem: Nutrition: Goal: Adequate nutrition will be maintained Outcome: Progressing   Problem: Coping: Goal: Level of anxiety will decrease Outcome: Progressing   Problem: Safety: Goal: Ability to remain free from injury will improve Outcome: Progressing   Problem: Skin Integrity: Goal: Risk for impaired skin integrity will decrease Outcome: Progressing   Problem: Education: Goal: Will demonstrate proper wound care and an understanding of methods to prevent future damage Outcome: Progressing Goal: Knowledge of disease or condition will improve Outcome: Progressing Goal: Knowledge of the prescribed therapeutic regimen will improve Outcome: Progressing Goal: Individualized Educational Video(s) Outcome: Progressing   Problem: Activity: Goal: Risk for activity intolerance will decrease Outcome: Progressing   Problem: Cardiac: Goal: Will achieve and/or maintain hemodynamic stability Outcome: Progressing   Problem: Clinical Measurements: Goal: Postoperative complications will be avoided or minimized Outcome: Progressing   Problem: Respiratory: Goal: Respiratory status will improve Outcome: Progressing   Problem: Skin Integrity: Goal: Wound healing without signs and symptoms of infection Outcome: Progressing Goal: Risk for impaired skin integrity will decrease Outcome: Progressing   Problem: Urinary  Elimination: Goal: Ability to achieve and maintain adequate renal perfusion and functioning will improve Outcome: Progressing

## 2018-11-19 NOTE — Progress Notes (Signed)
[facility]    NEPHROLOGY PROGRESS NOTE  SUBJECTIVE:  WBC up again, got IV Lasix yesterday with good UOP and cr coming down a little.  OBJECTIVE:  Vitals:   11/19/18 0623 11/19/18 0814  BP:  (!) 155/87  Pulse: 93 95  Resp: (!) 23 (!) 23  Temp:  98 F (36.7 C)  SpO2: 95% 97%    Intake/Output Summary (Last 24 hours) at 11/19/2018 1013 Last data filed at 11/19/2018 [id number] Gross per 24 hour  Intake 240 ml  Output 800 ml  Net -560 ml      General: sleeping, easily arousable HEENT: some red rash on cheeks L > R Neck:  + JVD, no adenopathy CV:  Heart RRR  Lungs:  Clear bilaterally Abd:  abd slightly distended, nontender with normal BS Extremities: +3 upper and lower extremity edema Skin: erythematous rash on cheeks- doesn't cross nasolabial folds  MEDICATIONS:  . sodium chloride   Intravenous Once  . amLODipine  5 mg Oral Daily  . calcium-vitamin D  1 tablet Oral Q breakfast  . Chlorhexidine Gluconate Cloth  6 each Topical Daily  . feeding supplement  1 Container Oral TID BM  . feeding supplement (PRO-STAT SUGAR FREE 64)  30 mL Per Tube BID  . FLUoxetine  10 mg Oral Daily  . furosemide  40 mg Intravenous Once  . levothyroxine  150 mcg Oral Q0600  . mouth rinse  15 mL Mouth Rinse BID  . metoCLOPramide (REGLAN) injection  5 mg Intravenous Q12H  . metoprolol tartrate  25 mg Oral BID  . montelukast  10 mg Oral QHS  . multivitamin  15 mL Oral Daily  . rosuvastatin  5 mg Oral q1800  . sodium chloride flush  3 mL Intravenous Q12H  . tamsulosin  0.4 mg Oral Daily       LABS:   CBC Latest Ref Rng & Units 11/19/2018 11/18/2018 11/17/2018  WBC 4.0 - 10.5 K/uL 16.7(H) 14.0(H) 9.4  Hemoglobin 12.0 - 15.0 g/dL 9.5(M8.5(L) 8.9(L) 8.8(L)  Hematocrit 36.0 - 46.0 % 28.2(L) 28.3(L) 30.0(L)  Platelets 150 - 400 K/uL 102(L) 99(L) 88(L)    CMP Latest Ref Rng & Units 11/19/2018 11/18/2018 11/17/2018  Glucose 70 - 99 mg/dL 841(L124(H) 244(W116(H) 102(V124(H)  BUN 6 - 20 mg/dL 25(D96(H) 66(Y93(H)  40(H97(H)  Creatinine 0.44 - 1.00 mg/dL 4.74(Q3.11(H) 5.95(G3.22(H) 3.87(F3.30(H)  Sodium 135 - 145 mmol/L 140 142 143  Potassium 3.5 - 5.1 mmol/L 4.2 4.6 4.7  Chloride 98 - 111 mmol/L 103 103 106  CO2 22 - 32 mmol/L 26 27 27   Calcium 8.9 - 10.3 mg/dL 9.8 9.7 9.4  Total Protein 6.5 - 8.1 g/dL - - -  Total Bilirubin 0.3 - 1.2 mg/dL - - -  Alkaline Phos 38 - 126 U/L - - -  AST 15 - 41 U/L - - -  ALT 0 - 44 U/L - - -    Lab Results  Component Value Date   CALCIUM 9.8 11/19/2018   CAION 1.19 10/08/2018   PHOS 5.5 (H) 11/19/2018       Component Value Date/Time   COLORURINE YELLOW 11/12/2018 1850   APPEARANCEUR CLEAR 11/12/2018 1850   LABSPEC 1.010 11/12/2018 1850   PHURINE 6.0 11/12/2018 1850   GLUCOSEU NEGATIVE 11/12/2018 1850   HGBUR MODERATE (A) 11/12/2018 1850   BILIRUBINUR NEGATIVE 11/12/2018 1850   KETONESUR NEGATIVE 11/12/2018 1850   PROTEINUR 100 (A) 11/12/2018 1850   UROBILINOGEN 0.2 04/13/2014 1030   NITRITE NEGATIVE 11/12/2018  [place] (A) 11/12/2018 1850      Component Value Date/Time   PHART 7.430 10/08/2018 0920   PCO2ART 35.6 10/08/2018 0920   PO2ART 70.0 (L) 10/08/2018 0920   HCO3 23.6 10/08/2018 0920   TCO2 25 10/08/2018 0920   ACIDBASEDEF 6.0 (H) 10/08/2018 0307   O2SAT 94.0 10/08/2018 0920       Component Value Date/Time   IRON 107 06/21/2015 1131   TIBC 309 06/21/2015 1131   FERRITIN 220 10/02/2018 0809   IRONPCTSAT 35 06/21/2015 1131       ASSESSMENT/PLAN:     1.  Acute kidney injury.  Remains nonoliguric.  Cr in the mid 3s.  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  Serologies and complements negative.  Discussed with aunt [name] at bedside that biopsy is warranted--> she is in agreement.  UP/C 6.5, no history of DM.  Sending A1c.  Continuing to try to make contact with aunt [name]--> would like to biopsy during this admission if possible; Cr coming as of today, will follow closely   2.  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 Completed  antibiotics  3.  Severe hypoalbuminemia.  Continue enteral nutrition.  4.  Cognitive delay.  5.  Anemia.  Hemoglobin stable for now  6.  Hyperkalemia.  Lokelma prn.  7.  Anasarca: 40 IV lasix yesterday and today.  Alb low so expect continued third spacing and don't expect it to completely resolve  8.  Leukocytosis/ low-grade fever: WBC ct up to 14 this AM and Tmax 100.7 blood cultures ordered, CXR, and UA + culture ordered.  Rest per primary.    Madelon Lips MD

## 2018-11-19 NOTE — Progress Notes (Signed)
      [address]       [place] [address]             [phone]      16 Days Post-Op Procedure(s) (LRB): ESOPHAGOGASTRODUODENOSCOPY (EGD) WITH PROPOFOL (N/A) BIOPSY Subjective: She is complaining of lower extremity pain. When I push on her lower legs bilaterally she states she feels pain. The patient has 2-3+ pitting edema in her lower and upper extremity and I believe this is likely the cause of her pain and tightness.  Objective: Vital signs in last 24 hours: Temp:  [98 F (36.7 C)-100.7 F (38.2 C)] 98 F (36.7 C) (09/19 0814) Pulse Rate:  [83-95] 95 (09/19 0814) Cardiac Rhythm: Normal sinus rhythm (09/18 2000) Resp:  [14-24] 23 (09/19 0814) BP: (141-155)/(78-87) 155/87 (09/19 0814) SpO2:  [94 %-98 %] 97 % (09/19 0814) Weight:  [93.4 kg] 93.4 kg (09/19 0623)     Intake/Output from previous day: 09/18 0701 - 09/19 0700 In: 240 [P.O.:240] Out: 800 [Urine:800] Intake/Output this shift: No intake/output data recorded.  General appearance: cooperative and no distress Heart: regular rate and rhythm, S1, S2 normal, no murmur, click, rub or gallop Lungs: clear to auscultation bilaterally Abdomen: soft, non-tender; bowel sounds normal; no masses,  no organomegaly Extremities: 2-3+ pitting edema in lower and upper extremity with tenderness in her lower extremity Wound: well healed  Lab Results: Recent Labs    11/18/18 1039 11/19/18 0532  WBC 14.0* 16.7*  HGB 8.9* 8.5*  HCT 28.3* 28.2*  PLT 99* 102*   BMET:  Recent Labs    11/18/18 1039 11/19/18 0532  NA 142 140  K 4.6 4.2  CL 103 103  CO2 27 26  GLUCOSE 116* 124*  BUN 93* 96*  CREATININE 3.22* 3.11*  CALCIUM 9.7 9.8    PT/INR: No results for input(s): LABPROT, INR in the last 72 hours. ABG    Component Value Date/Time   PHART 7.430 10/08/2018 0920   HCO3 23.6 10/08/2018 0920   TCO2 25 10/08/2018 0920   ACIDBASEDEF 6.0 (H) 10/08/2018 0307   O2SAT 94.0 10/08/2018 0920   CBG  (last 3)  Recent Labs    11/18/18 0507 11/18/18 2037 11/19/18 0407  GLUCAP 97 101* 122*    Assessment/Plan: S/P Procedure(s) (LRB): ESOPHAGOGASTRODUODENOSCOPY (EGD) WITH PROPOFOL (N/A) BIOPSY  1. CV- remains hemodynamically stable on Lopressor, Norvasc 2. Pulm- no acute issues, continue IS 3. 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 On Flomax. Remains very volume overloaded with 2-3+ pitting edema in her upper and lower extremity. 4. GI - continue tube feeds, oral intake remains poor 5. ID- remains afebrile ABX completed for Endocarditis 6. Deconditioning- PT/OT... patient placement still remains an issue   Plan: patient remains stable, renal issues per nephrology, continue PT/OT, disposition remains difficult   LOS: 49 days    Elgie Collard 11/19/2018

## 2018-11-20 DIAGNOSIS — R110 Nausea: Secondary | ICD-10-CM

## 2018-11-20 LAB — COMPREHENSIVE METABOLIC PANEL
ALT: 9 U/L (ref 0–44)
AST: 61 U/L — ABNORMAL HIGH (ref 15–41)
Albumin: 1.8 g/dL — ABNORMAL LOW (ref 3.5–5.0)
Alkaline Phosphatase: 334 U/L — ABNORMAL HIGH (ref 38–126)
Anion gap: 11 (ref 5–15)
BUN: 95 mg/dL — ABNORMAL HIGH (ref 6–20)
CO2: 28 mmol/L (ref 22–32)
Calcium: 9.8 mg/dL (ref 8.9–10.3)
Chloride: 104 mmol/L (ref 98–111)
Creatinine, Ser: 3.17 mg/dL — ABNORMAL HIGH (ref 0.44–1.00)
GFR calc Af Amer: 18 mL/min — ABNORMAL LOW (ref >60)
GFR calc non Af Amer: 15 mL/min — ABNORMAL LOW (ref >60)
Glucose, Bld: 118 mg/dL — ABNORMAL HIGH (ref 70–99)
Potassium: 4.4 mmol/L (ref 3.5–5.1)
Sodium: 143 mmol/L (ref 135–145)
Total Bilirubin: 0.8 mg/dL (ref 0.3–1.2)
Total Protein: 6.1 g/dL — ABNORMAL LOW (ref 6.5–8.1)

## 2018-11-20 LAB — CBC
HCT: 27.1 % — ABNORMAL LOW (ref 36.0–46.0)
Hemoglobin: 8.3 g/dL — ABNORMAL LOW (ref 12.0–15.0)
MCH: 30.3 pg (ref 26.0–34.0)
MCHC: 30.6 g/dL (ref 30.0–36.0)
MCV: 98.9 fL (ref 80.0–100.0)
Platelets: 122 10*3/uL — ABNORMAL LOW (ref 150–400)
RBC: 2.74 MIL/uL — ABNORMAL LOW (ref 3.87–5.11)
RDW: 16.1 % — ABNORMAL HIGH (ref 11.5–15.5)
WBC: 15.3 10*3/uL — ABNORMAL HIGH (ref 4.0–10.5)
nRBC: 0 % (ref 0.0–0.2)

## 2018-11-20 LAB — URINALYSIS, ROUTINE W REFLEX MICROSCOPIC
Bilirubin Urine: NEGATIVE
Glucose, UA: NEGATIVE mg/dL
Ketones, ur: NEGATIVE mg/dL
Nitrite: NEGATIVE
Protein, ur: 100 mg/dL — AB
Specific Gravity, Urine: 1.013 (ref 1.005–1.030)
pH: 6 (ref 5.0–8.0)

## 2018-11-20 LAB — GLUCOSE, CAPILLARY
Glucose-Capillary: 94 mg/dL (ref 70–99)
Glucose-Capillary: 92 mg/dL (ref 70–99)
Glucose-Capillary: 91 mg/dL (ref 70–99)
Glucose-Capillary: 97 mg/dL (ref 70–99)

## 2018-11-20 MED ORDER — NEPRO/CARBSTEADY PO LIQD
910.0000 mL | ORAL | Status: DC
  Administered 2018-11-20: 22:00:00 910 mL
  Filled 2018-11-20: qty 948

## 2018-11-20 MED ORDER — ACETAMINOPHEN 325 MG PO TABS
650.0000 mg | ORAL_TABLET | Freq: Four times a day (QID) | ORAL | Status: AC | PRN
  Administered 2018-11-23 – 2018-11-25 (×3): 650 mg via ORAL
  Filled 2018-11-20 (×3): qty 2

## 2018-11-20 NOTE — Progress Notes (Signed)
Daily Progress Note   Patient Name: Audrey Collier       Date: 11/20/2018 DOB: 1960/06/23  Age: 58 y.o. MRN#: [id number] Attending Physician: Lajuana Matte, MD Primary Care Physician: Jani Gravel, MD Admit Date: 10/01/2018  Reason for Consultation/Follow-up: Establishing goals of care  Subjective: Patient awake and alert. She smiles as I enter the room and began waving. Aunt, [name] is at the bedside. Patient denies pain or shortness of breath. She is holding a small brown bear, and states "his name is Brownie". She then asks me to look at her card on the wall with the dog. She states "aunt [name] brought it!" Support given.   Patient's appetite remains poor. RN reports she only ate some of the applesauce from her breakfast tray and a few sips. No signs of nausea or vomiting at that time. [name] reports she is concerned with patient's continued vomiting. Reports given that she did have 2 emesis episodes yesterday.   Updates provided to [facility] as requested. She is requesting to proceed with renal biopsy if recommended by Nephrology. She was inquiring about completing consent form to prevent delay in biopsy when needed. Explained Nephrology would further update later during the week if recommended and if she is not available to signs, she is aware that consent can be obtained via phone. [name] reports she is back at work now and has a lunch break [phone] (which is the best time to call) or after 530. She is not able to have her phone outside of those times but often will check for messages. She also states [name] is able to provide or sign for consent if needed.   [name] reports family is remaining hopeful for Audrey Collier's recovery. Although they are still struggling with the fact she can no  longer be cared for in the home, they are hopeful she will go to a facility and receive the care she needs and continue to thrive. [name] sharing how much more alert and interactive patient is compared to weeks ago.   Goals remain clear family is wishing to continue with full aggressive medical interventions with hopes of improvement. Requesting outpatient palliative support once she is able to discharge. Family unable to care for patient in the home and requesting placement for appropriate care and safety, with awareness of no bed  offers on previous attempts by CSW team. Confirmed DNR/DNI.    Length of Stay: 50  Current Medications: Scheduled Meds:  . sodium chloride   Intravenous Once  . amLODipine  5 mg Oral Daily  . calcium-vitamin D  1 tablet Oral Q breakfast  . Chlorhexidine Gluconate Cloth  6 each Topical Daily  . feeding supplement  1 Container Oral TID BM  . feeding supplement (PRO-STAT SUGAR FREE 64)  30 mL Per Tube BID  . FLUoxetine  10 mg Oral Daily  . levothyroxine  150 mcg Oral Q0600  . mouth rinse  15 mL Mouth Rinse BID  . metoCLOPramide (REGLAN) injection  5 mg Intravenous Q12H  . metoprolol tartrate  25 mg Oral BID  . montelukast  10 mg Oral QHS  . multivitamin  15 mL Oral Daily  . rosuvastatin  5 mg Oral q1800  . sodium chloride flush  3 mL Intravenous Q12H  . tamsulosin  0.4 mg Oral Daily    Continuous Infusions: . sodium chloride 10 mL/hr at 11/16/18 1500  . feeding supplement (NEPRO CARB STEADY) 910 mL (11/18/18 2040)    PRN Meds: sodium chloride, acetaminophen, acetaminophen, bisacodyl **OR** bisacodyl, lactulose, magic mouthwash, metoprolol tartrate, morphine injection, ondansetron (ZOFRAN) IV, oxyCODONE, sodium chloride flush, traMADol  Physical Exam          -Awake, alert to self and family, obese -RRR -CTA bilaterally  -Abdominal distention, non-tenderness, +bowel sounds x4, PEG in place, clamped -bilateral lower extremity edema, upper extremity  edema - rash to bilateral cheeks  -able to follow commands  Vital Signs: BP (!) 154/83 (BP Location: Right Arm)   Pulse 82   Temp 98.1 F (36.7 C) (Axillary)   Resp 18   Ht 5\' 2"  (1.575 m)   Wt 90.5 kg   LMP 04/16/2013 (Approximate)   SpO2 100%   BMI 36.49 kg/m  SpO2: SpO2: 100 % O2 Device: O2 Device: Nasal Cannula O2 Flow Rate: O2 Flow Rate (L/min): 2 L/min  Intake/output summary:   Intake/Output Summary (Last 24 hours) at 11/20/2018 1423 Last data filed at 11/20/2018 1200 Gross per 24 hour  Intake 540 ml  Output 1300 ml  Net -760 ml   LBM: Last BM Date: 11/17/18 Baseline Weight: Weight: 76.7 kg Most recent weight: Weight: 90.5 kg       Palliative Assessment/Data: PEG/POOR PO INTAKE   Flowsheet Rows     Most Recent Value  Intake Tab  Referral Department  Surgery  Unit at Time of Referral  ICU  Palliative Care Primary Diagnosis  Cardiac  Date Notified  11/09/18  Palliative Care Type  New Palliative care  Reason for referral  Clarify Goals of Care  Date of Admission  10/01/18  Date first seen by Palliative Care  11/10/18  # of days Palliative referral response time  1 Day(s)  # of days IP prior to Palliative referral  39  Clinical Assessment  Psychosocial & Spiritual Assessment  Palliative Care Outcomes      Patient Active Problem List   Diagnosis Date Noted  . Palliative care by specialist   . Pressure injury of skin 10/10/2018  . S/P AVR (aortic valve replacement) 10/07/2018  . Acute respiratory failure with hypoxia ([name])   . Staphylococcus aureus bacteremia with sepsis ([name]) 10/03/2018  . Bacteremia due to methicillin susceptible Staphylococcus aureus (MSSA)   . Aortic valve endocarditis   . Multifocal pneumonia   . Acute hypoxemic respiratory failure ([name])   . Severe sepsis ([name])  10/01/2018  . GERD (gastroesophageal reflux disease) 03/18/2017  . Loose stools 09/30/2015  . Abdominal pain 11/30/2014  . Helicobacter pylori gastritis 10/24/2014  .  PMB (postmenopausal bleeding) 03/26/2014  . Thickened endometrium 03/26/2014  . Endometrial polyp 03/26/2014  . Constipation 04/11/2013  . Fecal occult blood test positive 03/15/2013    Palliative Care Assessment & Plan   Recommendations/Plan:  Continue current plan of care per medical team   Goals are set and clear by family. Family remains hopeful for stability/improvement, in agreement with renal biopsy and other aggressive medical interventions as they are recommended, SNF placement with outpatient palliative support.    PMT will continue to support and follow as needed. Goals are clear at this time. Please call if needed.   Goals of Care and Additional Recommendations:  Limitations on Scope of Treatment: Full Scope Treatment  Code Status:    Code Status Orders  (From admission, onward)         Start     Ordered   10/02/18 0218  Full code  Continuous     10/02/18 0217        Code Status History    This patient has a current code status but no historical code status.   Advance Care Planning Activity      Prognosis:   Unable to determine (Guarded)  Discharge Planning:  To Be Determined pending SNF placement  Care plan was discussed with RN, patient's family Bjorn Loser & Jimmie).   Thank you for allowing the Palliative Medicine Team to assist in the care of this patient.  Total Time: 35 min.   Greater than 50%  of this time was spent counseling and coordinating care related to the above assessment and plan.  Willette Alma, AGPCNP-BC Palliative Medicine Team    Please contact Palliative Medicine Team phone at [phone] for questions and concerns.

## 2018-11-20 NOTE — Progress Notes (Signed)
Nutrition Brief Note  Received page to on-call pager from RN regarding patient's tube feed order. No tube feeding available on floor for RN to provide and per Pharmacy order was showing up as expired. Noted order had been changed on 9/18 and re-ordered as one-time only tube feed order (likely accidentally). No tube feed was given last night either due to this change. Adjusted tube feed order per recommendations left by RD on 9/14. Appreciate RN page. Please page with any further questions or concerns.  Willey Blade, MS, [place], LDN Office: [phone] Pager: [phone] After Hours/Weekend Pager: [phone]

## 2018-11-20 NOTE — Progress Notes (Signed)
[facility]    NEPHROLOGY PROGRESS NOTE  SUBJECTIVE:  Cultures NG x 2 days.  Cr coming down a little, will get labs for today.  Needs more diuresis.  No PO intake.    OBJECTIVE:  Vitals:   11/20/18 0608 11/20/18 0800  BP:  (!) 154/83  Pulse:  82  Resp: 20 18  Temp:  98.1 F (36.7 C)  SpO2:  100%    Intake/Output Summary (Last 24 hours) at 11/20/2018 1023 Last data filed at 11/20/2018 0523 Gross per 24 hour  Intake 380 ml  Output 1300 ml  Net -920 ml      General: sleeping, easily arousable HEENT: some red rash on cheeks L > R, largely unchanged.  Neck:  + JVD, no adenopathy CV:  Heart RRR  Lungs:  Clear bilaterally Abd:  abd slightly distended, nontender with normal BS Extremities: +3 upper and lower extremity edema Skin: erythematous rash on cheeks- doesn't cross nasolabial folds  MEDICATIONS:  . sodium chloride   Intravenous Once  . amLODipine  5 mg Oral Daily  . calcium-vitamin D  1 tablet Oral Q breakfast  . Chlorhexidine Gluconate Cloth  6 each Topical Daily  . feeding supplement  1 Container Oral TID BM  . feeding supplement (PRO-STAT SUGAR FREE 64)  30 mL Per Tube BID  . FLUoxetine  10 mg Oral Daily  . levothyroxine  150 mcg Oral Q0600  . mouth rinse  15 mL Mouth Rinse BID  . metoCLOPramide (REGLAN) injection  5 mg Intravenous Q12H  . metoprolol tartrate  25 mg Oral BID  . montelukast  10 mg Oral QHS  . multivitamin  15 mL Oral Daily  . rosuvastatin  5 mg Oral q1800  . sodium chloride flush  3 mL Intravenous Q12H  . tamsulosin  0.4 mg Oral Daily       LABS:   CBC Latest Ref Rng & Units 11/19/2018 11/18/2018 11/17/2018  WBC 4.0 - 10.5 K/uL 16.7(H) 14.0(H) 9.4  Hemoglobin 12.0 - 15.0 g/dL 8.5(L) 8.9(L) 8.8(L)  Hematocrit 36.0 - 46.0 % 28.2(L) 28.3(L) 30.0(L)  Platelets 150 - 400 K/uL 102(L) 99(L) 88(L)    CMP Latest Ref Rng & Units 11/19/2018 11/18/2018 11/17/2018  Glucose 70 - 99 mg/dL 124(H) 116(H) 124(H)  BUN 6 - 20 mg/dL 96(H) 93(H)  97(H)  Creatinine 0.44 - 1.00 mg/dL 3.11(H) 3.22(H) 3.30(H)  Sodium 135 - 145 mmol/L 140 142 143  Potassium 3.5 - 5.1 mmol/L 4.2 4.6 4.7  Chloride 98 - 111 mmol/L 103 103 106  CO2 22 - 32 mmol/L 26 27 27   Calcium 8.9 - 10.3 mg/dL 9.8 9.7 9.4  Total Protein 6.5 - 8.1 g/dL - - -  Total Bilirubin 0.3 - 1.2 mg/dL - - -  Alkaline Phos 38 - 126 U/L - - -  AST 15 - 41 U/L - - -  ALT 0 - 44 U/L - - -    Lab Results  Component Value Date   CALCIUM 9.8 11/19/2018   CAION 1.19 10/08/2018   PHOS 5.5 (H) 11/19/2018       Component Value Date/Time   COLORURINE YELLOW 11/12/2018 1850   APPEARANCEUR CLEAR 11/12/2018 1850   LABSPEC 1.010 11/12/2018 1850   PHURINE 6.0 11/12/2018 1850   GLUCOSEU NEGATIVE 11/12/2018 1850   HGBUR MODERATE (A) 11/12/2018 1850   BILIRUBINUR NEGATIVE 11/12/2018 1850   KETONESUR NEGATIVE 11/12/2018 1850   PROTEINUR 100 (A) 11/12/2018 1850   UROBILINOGEN 0.2 04/13/2014 1030  NITRITE NEGATIVE 11/12/2018 1850   LEUKOCYTESUR SMALL (A) 11/12/2018 1850      Component Value Date/Time   PHART 7.430 10/08/2018 0920   PCO2ART 35.6 10/08/2018 0920   PO2ART 70.0 (L) 10/08/2018 0920   HCO3 23.6 10/08/2018 0920   TCO2 25 10/08/2018 0920   ACIDBASEDEF 6.0 (H) 10/08/2018 0307   O2SAT 94.0 10/08/2018 0920       Component Value Date/Time   IRON 107 06/21/2015 1131   TIBC 309 06/21/2015 1131   FERRITIN 220 10/02/2018 0809   IRONPCTSAT 35 06/21/2015 1131       ASSESSMENT/PLAN:     1.  Acute kidney injury.  Remains nonoliguric.  Cr in the mid 3s.  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  Serologies and complements negative.  Discussed with aunt Kendal HymenBonnie at bedside that biopsy may be warranted--> she is in agreement.  UP/C 6.5, no history of DM.  Sending A1c--> 4.9 on 9/19.  Cr now has started to trend down a little although still has azotemia.  If goes in downward direction, bx may not be an imperative in the inpt setting--also need to take in functional status.    2.  Status  post AVR on 8/7 for MSSA infective endocarditis. Completed antibiotics  3.  Severe hypoalbuminemia.  Continue enteral nutrition as able, getting meds for constipation.  4.  Cognitive delay.  5.  Anemia.  Hemoglobin stable for now  6.  Hyperkalemia.  Lokelma prn.  7.  Anasarca: 40 IV Lasix 9/18 and 9/19.  Alb low so expect continued third spacing and don't expect it to completely resolve.  Will likely need more IV lasix today but would like to see Cr and K first before ordring  8.  Leukocytosis/ low-grade fever: WBC ct up,  and Tmax 100.7 9/18 blood cultures ordered, CXR, and UA + culture ordered but not sent, will reorder.  Rest per primary.    Bufford ButtnerElizabeth Keijuan Schellhase MD

## 2018-11-20 NOTE — Progress Notes (Signed)
      [address]       [place] [address]             [phone]      17 Days Post-Op Procedure(s) (LRB): ESOPHAGOGASTRODUODENOSCOPY (EGD) WITH PROPOFOL (N/A) BIOPSY Subjective:  She feels okay this morning. She does have some abdominal pain and did vomit yesterday x 2. She still refuses to take anything by mouth.  Objective: Vital signs in last 24 hours: Temp:  [98.1 F (36.7 C)-99.7 F (37.6 C)] 98.1 F (36.7 C) (09/20 0800) Pulse Rate:  [77-92] 82 (09/20 0800) Cardiac Rhythm: Normal sinus rhythm (09/19 1900) Resp:  [18-26] 18 (09/20 0800) BP: (138-155)/(74-87) 154/83 (09/20 0800) SpO2:  [96 %-100 %] 100 % (09/20 0800) Weight:  [90.5 kg] 90.5 kg (09/20 0500)     Intake/Output from previous day: 09/19 0701 - 09/20 0700 In: 2525 [P.O.:60; NG/GT:2405] Out: 1300 [Urine:1300] Intake/Output this shift: No intake/output data recorded.  General appearance: alert, cooperative and no distress Heart: regular rate and rhythm, S1, S2 normal, no murmur, click, rub or gallop Lungs: clear to auscultation bilaterally Abdomen: soft, non-tender; bowel sounds normal; no masses,  no organomegaly Extremities: 2-3+ pitting edema in upper and lower extremities Wound: well healed  Lab Results: Recent Labs    11/18/18 1039 11/19/18 0532  WBC 14.0* 16.7*  HGB 8.9* 8.5*  HCT 28.3* 28.2*  PLT 99* 102*   BMET:  Recent Labs    11/18/18 1039 11/19/18 0532  NA 142 140  K 4.6 4.2  CL 103 103  CO2 27 26  GLUCOSE 116* 124*  BUN 93* 96*  CREATININE 3.22* 3.11*  CALCIUM 9.7 9.8    PT/INR: No results for input(s): LABPROT, INR in the last 72 hours. ABG    Component Value Date/Time   PHART 7.430 10/08/2018 0920   HCO3 23.6 10/08/2018 0920   TCO2 25 10/08/2018 0920   ACIDBASEDEF 6.0 (H) 10/08/2018 0307   O2SAT 94.0 10/08/2018 0920   CBG (last 3)  Recent Labs    11/19/18 1805 11/19/18 2027 11/20/18 0317  GLUCAP 104* 96 94    Assessment/Plan: S/P  Procedure(s) (LRB): ESOPHAGOGASTRODUODENOSCOPY (EGD) WITH PROPOFOL (N/A) BIOPSY  1. CV-remains hemodynamically stable on Lopressor, Norvasc. BP climbing a little with SBP in the 150s-will allow for kidney perfusion. 2. Pulm- no acute issues, continue IS 3. 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 On Flomax. Remains very volume overloaded with 2-3+ pitting edema in her upper and lower extremity. 4. GI - continue tube feeds-may need to slow rate if n/v continues, oral intake remains poor 5. ID- remains afebrile ABX completed for Endocarditis 6. Deconditioning- PT/OT... patient placement still remains an issue  Plan: Diuretics for Anasarca per Nephrology. No growth from the [facility] day 2 and no further fevers. TF stopped last night.  N/v could be due to constipation for 6 days-will try some laxatives today.    LOS: 50 days    Elgie Collard 11/20/2018

## 2018-11-21 LAB — URINE CULTURE: Culture: <10000 — AB

## 2018-11-21 LAB — GLUCOSE, CAPILLARY
Glucose-Capillary: 117 mg/dL — ABNORMAL HIGH (ref 70–99)
Glucose-Capillary: 126 mg/dL — ABNORMAL HIGH (ref 70–99)
Glucose-Capillary: 113 mg/dL — ABNORMAL HIGH (ref 70–99)
Glucose-Capillary: 118 mg/dL — ABNORMAL HIGH (ref 70–99)

## 2018-11-21 MED ORDER — METOCLOPRAMIDE HCL 5 MG/ML IJ SOLN
10.0000 mg | Freq: Four times a day (QID) | INTRAMUSCULAR | Status: DC
  Administered 2018-11-21 – 2018-12-06 (×61): 10 mg via INTRAVENOUS
  Filled 2018-11-21 (×60): qty 2

## 2018-11-21 MED ORDER — OSMOLITE 1.5 CAL PO LIQD
1000.0000 mL | ORAL | Status: DC
  Administered 2018-11-21 – 2018-11-22 (×2): 1000 mL
  Filled 2018-11-21 (×3): qty 1000

## 2018-11-21 MED ORDER — PRO-STAT SUGAR FREE PO LIQD
30.0000 mL | Freq: Three times a day (TID) | ORAL | Status: DC
  Administered 2018-11-21 – 2018-11-22 (×5): 30 mL
  Filled 2018-11-21 (×5): qty 30

## 2018-11-21 NOTE — TOC Progression Note (Signed)
Transition of Care [facility]) - Progression Note    Patient Details  Name: Audrey Collier MRN: [id number] Date of Birth: [birth year]  Transition of Care [facility]) CM/SW [place], [place] Phone Number: 11/21/2018, 1:01 PM  Clinical Narrative:     Patient has no bed offers. CSW will continue to seek placement and assist with discharge planning.  [name], MSW, [facility] Clinical Social Worker [phone]   Expected Discharge Plan: [place] Barriers to Discharge: No SNF bed, Continued Medical Work up  Expected Discharge Plan and Services Expected Discharge Plan: [place] In-house Referral: Clinical Social Work Discharge Planning Services: NA Post Acute Care Choice: [place] Living arrangements for the past 2 months: Single Family Home                 DME Arranged: N/A DME Agency: NA                   Social Determinants of Health (SDOH) Interventions    Readmission Risk Interventions No flowsheet data found.

## 2018-11-21 NOTE — Progress Notes (Signed)
Nutrition Follow-up  DOCUMENTATION CODES:   Not applicable  INTERVENTION:   Change tube feeding:  -Osmolite 1.5 @ 75 ml/hr x 14 hours (1800-0800) -30 ml Prostat TID   Provides: 1875 kcals, 111 grams protein, 800 ml free water. Meets 100% of needs.   NUTRITION DIAGNOSIS:   Inadequate oral intake related to poor appetite as evidenced by meal completion < 50%.  Ongoing  GOAL:   Patient will meet greater than or equal to 90% of their needs  Addressed via TF  MONITOR:   PO intake, Supplement acceptance, Diet advancement, TF tolerance, Weight trends, Labs, I & O's, Skin  REASON FOR ASSESSMENT:   Consult Enteral/tube feeding initiation and management(nocturnal)  ASSESSMENT:   58 yo female admitted with fever, vomiting, weakness. Found to have MSSA bacteremia with PNA. Echo showed vegetation. Required transfer to the ICU and intubation on 8/4. PMH includes developmental delay, GERD, anxiety, hypothyroidism, HLD.   8/6- TEE showing large aortic valve vegetation 8/7- s/p aortic valve replacement 8/8- extubated, diet advanced to Dysphagia 2 8/21- Cortrak placement(tip in stomach); spoke with TCTS, ok to start TF 9/1- HIDA negative 9/3- Cortrak pulled during EGD 9/4- s/p PEG placement 9/7- PEG to gravity, CT abdomen negative 9/8- clear liquids, multiple episodes vomiting   Spoke with RN at bedside. Reports having difficulty obtaining liter bottles of Nepro from pharmacy. Will change back to Osmolite 1.5 tonight as labs look better. Pt having no issue tolerating nocturnal feedings. Continues to vomit upon eating. Meal completions charted as 0-50% for pt's last eight meals. RN seems to think pt tolerates PO when reglan is given right before. Will continue tube feeding to meet 100% of needs as intake remain inconsistent.   Admission weight: 77.7 kg Current weight: 91.5 kg   I/O: +5,112 ml since 9/7 UOP: 1,100 ml x 24 hrs   Medications: 10 mg reglan QID, liquid MVI Labs: Cr  3.17- plateau CBG 91-126   Diet Order:   Diet Order            Diet clear liquid Room service appropriate? No; Fluid consistency: Thin  Diet effective now              EDUCATION NEEDS:   No education needs have been identified at this time  Skin:  Skin Assessment: Skin Integrity Issues: Skin Integrity Issues:: Stage II, Incisions DTI: R ear Stage II: R buttocks Incisions: chest Other: MASD-arm, buttocks, labia  Last BM:  9/17  Height:   Ht Readings from Last 1 Encounters:  10/07/18 5\' 2"  (1.575 m)    Weight:   Wt Readings from Last 1 Encounters:  11/21/18 91.5 kg    Ideal Body Weight:  56.8 kg  BMI:  Body mass index is 36.9 kg/m.  Estimated Nutritional Needs:   Kcal:  1800-2000  Protein:  100-115 gm  Fluid:  >/= 1.8 L   Mariana Single RD, LDN Clinical Nutrition Pager # [phone]

## 2018-11-21 NOTE — Progress Notes (Signed)
      [address]       [place] [address]             [phone]      18 Days Post-Op Procedure(s) (LRB): ESOPHAGOGASTRODUODENOSCOPY (EGD) WITH PROPOFOL (N/A) BIOPSY   Subjective:  Patient more alert, talkative this morning.  Her family was here to visit this weekend.  She continues to vomit every time she takes anything in by mouth.   Objective: Vital signs in last 24 hours: Temp:  [97.7 F (36.5 C)-99 F (37.2 C)] 98.7 F (37.1 C) (09/21 0800) Pulse Rate:  [73-85] 83 (09/21 0800) Cardiac Rhythm: Normal sinus rhythm (09/20 1900) Resp:  [17-22] 22 (09/21 0800) BP: (131-153)/(77-88) 146/81 (09/21 0800) SpO2:  [98 %-100 %] 100 % (09/21 0800) Weight:  [91.5 kg] 91.5 kg (09/21 0500)  Intake/Output from previous day: 09/20 0701 - 09/21 0700 In: 480 [P.O.:480] Out: 1100 [Urine:1100]  General appearance: alert, cooperative and no distress Heart: regular rate and rhythm Lungs: diminished breath sounds bibasilar Abdomen: soft, mild distention, good BS Extremities: edema 1+, improving Wound: well healed  Lab Results: Recent Labs    11/19/18 0532 11/20/18 1433  WBC 16.7* 15.3*  HGB 8.5* 8.3*  HCT 28.2* 27.1*  PLT 102* 122*   BMET:  Recent Labs    11/19/18 0532 11/20/18 1433  NA 140 143  K 4.2 4.4  CL 103 104  CO2 26 28  GLUCOSE 124* 118*  BUN 96* 95*  CREATININE 3.11* 3.17*  CALCIUM 9.8 9.8    PT/INR: No results for input(s): LABPROT, INR in the last 72 hours. ABG    Component Value Date/Time   PHART 7.430 10/08/2018 0920   HCO3 23.6 10/08/2018 0920   TCO2 25 10/08/2018 0920   ACIDBASEDEF 6.0 (H) 10/08/2018 0307   O2SAT 94.0 10/08/2018 0920   CBG (last 3)  Recent Labs    11/20/18 1740 11/20/18 2027 11/21/18 0343  GLUCAP 91 97 117*    Assessment/Plan: S/P Procedure(s) (LRB): ESOPHAGOGASTRODUODENOSCOPY (EGD) WITH PROPOFOL (N/A) BIOPSY  1. CV- remains hemodynamically stable- on Lopressor, Norvasc 2. Pulm- no acute issues,  not requiring oxygen, 3 3. Renal- creatinine improved yesterday, no labs ordered for this morning.. have placed order...care per nephrology, possible renal biopsy 4. GI- attempts oral intake, however she vomits after every time she eats, continue tubes feeds.... need to identify source as to why patient vomits after eating... she has been evaluated by GI and General surgery previously.. may need to have them re-evaluate 5. ID- afebrile, ABX are complete, cultures remain negative 6. Deconditioning- PT/OT continue, patient needs placement, likely permanent as family can no longer provide care for her 6. Dispo- continue current care   LOS: 51 days    Ellwood Handler 11/21/2018

## 2018-11-22 ENCOUNTER — Inpatient Hospital Stay (HOSPITAL_COMMUNITY): Payer: Medicaid Other

## 2018-11-22 ENCOUNTER — Encounter (HOSPITAL_COMMUNITY): Payer: Self-pay | Admitting: Radiology

## 2018-11-22 LAB — COMPREHENSIVE METABOLIC PANEL
ALT: 77 U/L — ABNORMAL HIGH (ref 0–44)
AST: 433 U/L — ABNORMAL HIGH (ref 15–41)
Albumin: 1.8 g/dL — ABNORMAL LOW (ref 3.5–5.0)
Alkaline Phosphatase: 535 U/L — ABNORMAL HIGH (ref 38–126)
Anion gap: 11 (ref 5–15)
BUN: 104 mg/dL — ABNORMAL HIGH (ref 6–20)
CO2: 28 mmol/L (ref 22–32)
Calcium: 9.2 mg/dL (ref 8.9–10.3)
Chloride: 104 mmol/L (ref 98–111)
Creatinine, Ser: 3.2 mg/dL — ABNORMAL HIGH (ref 0.44–1.00)
GFR calc Af Amer: 18 mL/min — ABNORMAL LOW (ref >60)
GFR calc non Af Amer: 15 mL/min — ABNORMAL LOW (ref >60)
Glucose, Bld: 135 mg/dL — ABNORMAL HIGH (ref 70–99)
Potassium: 4.2 mmol/L (ref 3.5–5.1)
Sodium: 143 mmol/L (ref 135–145)
Total Bilirubin: 0.7 mg/dL (ref 0.3–1.2)
Total Protein: 5.5 g/dL — ABNORMAL LOW (ref 6.5–8.1)

## 2018-11-22 LAB — GLUCOSE, CAPILLARY
Glucose-Capillary: 132 mg/dL — ABNORMAL HIGH (ref 70–99)
Glucose-Capillary: 123 mg/dL — ABNORMAL HIGH (ref 70–99)
Glucose-Capillary: 136 mg/dL — ABNORMAL HIGH (ref 70–99)

## 2018-11-22 LAB — CBC
HCT: 26.9 % — ABNORMAL LOW (ref 36.0–46.0)
Hemoglobin: 7.9 g/dL — ABNORMAL LOW (ref 12.0–15.0)
MCH: 29.2 pg (ref 26.0–34.0)
MCHC: 29.4 g/dL — ABNORMAL LOW (ref 30.0–36.0)
MCV: 99.3 fL (ref 80.0–100.0)
Platelets: 144 10*3/uL — ABNORMAL LOW (ref 150–400)
RBC: 2.71 MIL/uL — ABNORMAL LOW (ref 3.87–5.11)
RDW: 15.8 % — ABNORMAL HIGH (ref 11.5–15.5)
WBC: 11.1 10*3/uL — ABNORMAL HIGH (ref 4.0–10.5)
nRBC: 0 % (ref 0.0–0.2)

## 2018-11-22 LAB — PROTIME-INR
INR: 1.5 — ABNORMAL HIGH (ref 0.8–1.2)
Prothrombin Time: 18.2 seconds — ABNORMAL HIGH (ref 11.4–15.2)

## 2018-11-22 MED ORDER — FUROSEMIDE 10 MG/ML IJ SOLN
80.0000 mg | Freq: Once | INTRAMUSCULAR | Status: AC
  Administered 2018-11-22: 09:00:00 80 mg via INTRAVENOUS
  Filled 2018-11-22: qty 8

## 2018-11-22 MED ORDER — ONDANSETRON HCL 4 MG/2ML IJ SOLN
INTRAMUSCULAR | Status: AC
  Filled 2018-11-22: qty 2

## 2018-11-22 NOTE — Progress Notes (Signed)
Physical Therapy Treatment Patient Details Name: Audrey Collier MRN: [id number] DOB: Feb 03, 1961 Today's Date: 11/22/2018    History of Present Illness 58 yo female presented with fever, vomiting, weakness.  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  Echo showed vegetation.  Transferred to ICU 8/04 due to respiratory failure.  Underwent AVR with TEE intraoprative on 10/07/18. Extubated 10/08/18.  PEG placed 11/04/18.    PT Comments    Pt remains to have flat affect with minimal verbal communication. Pt with noted L UE and bilat LE and abdominal swelling. Pt requiring maxA for all mobility. With max verbal cues pt ultimately required maxA to don socks. Per chart pt was indep PTA. Pt unsafe to return home at this time and would benefit from rehab to progress independence.   Follow Up Recommendations  SNF;LTACH     Equipment Recommendations       Recommendations for Other Services       Precautions / Restrictions Precautions Precautions: Fall Restrictions Weight Bearing Restrictions: No Other Position/Activity Restrictions: sternal precautions     Mobility  Bed Mobility Overal bed mobility: Needs Assistance Bed Mobility: Supine to Sit;Sit to Supine     Supine to sit: Mod assist;+2 for physical assistance Sit to supine: Mod assist;+2 for physical assistance   General bed mobility comments: max directional verbal cues to move LEs to EOB, maxAx2 for trunk elevation, pt did initiate scooting to EOB but ultimately required modAX2 to sit EOB with feet on the floor  Transfers Overall transfer level: Needs assistance Equipment used: (2 person lift with bed pad) Transfers: Sit to/from Stand;Stand Pivot Transfers Sit to Stand: Max assist;+2 physical assistance Stand pivot transfers: Max assist;+2 physical assistance       General transfer comment: completed transfer x2 due to transportation coming. Attempted standing x 4, unable to achieve full upright standing but able to clear  bottom, pt attempted to move LEs during std pvt however unable to clear foot  Ambulation/Gait             General Gait Details: unable   Stairs             Wheelchair Mobility    Modified Rankin (Stroke Patients Only)       Balance Overall balance assessment: Needs assistance Sitting-balance support: No upper extremity supported;Feet supported Sitting balance-Leahy Scale: Fair Sitting balance - Comments: pt able to maintain EOB balance with close supervision   Standing balance support: Bilateral upper extremity supported Standing balance-Leahy Scale: Zero Standing balance comment: dependent on physical assist                            Cognition Arousal/Alertness: Awake/alert Behavior During Therapy: Flat affect Overall Cognitive Status: History of cognitive impairments - at baseline Area of Impairment: Following commands;Safety/judgement;Problem solving                   Current Attention Level: Sustained   Following Commands: Follows one step commands with increased time;Follows multi-step commands inconsistently Safety/Judgement: Decreased awareness of deficits Awareness: Intellectual Problem Solving: Slow processing;Decreased initiation;Difficulty sequencing;Requires verbal cues;Requires tactile cues General Comments: pt very flat, minimal verbal communication, maximal directional verbal cues to attempt to don socks, complete transfers      Exercises General Exercises - Lower Extremity Long Arc Quad: AROM;Both;10 reps;Seated    General Comments General comments (skin integrity, edema, etc.): VSS      Pertinent Vitals/Pain Pain Assessment: Faces Faces Pain Scale: No  hurt Pain Intervention(s): Monitored during session    Home Living                      Prior Function            PT Goals (current goals can now be found in the care plan section) Progress towards PT goals: Progressing toward goals     Frequency    Min 3X/week      PT Plan Current plan remains appropriate    Co-evaluation              AM-PAC PT "6 Clicks" Mobility   Outcome Measure  Help needed turning from your back to your side while in a flat bed without using bedrails?: A Lot Help needed moving from lying on your back to sitting on the side of a flat bed without using bedrails?: A Lot Help needed moving to and from a bed to a chair (including a wheelchair)?: A Lot Help needed standing up from a chair using your arms (e.g., wheelchair or bedside chair)?: A Lot Help needed to walk in hospital room?: Total Help needed climbing 3-5 steps with a railing? : Total 6 Click Score: 10    End of Session Equipment Utilized During Treatment: Gait belt;Oxygen Activity Tolerance: Patient limited by fatigue Patient left: in bed;with nursing/sitter in room;with call bell/phone within reach Nurse Communication: Mobility status PT Visit Diagnosis: Muscle weakness (generalized) (M62.81);Other abnormalities of gait and mobility (R26.89)     Time: 6160-7371 PT Time Calculation (min) (ACUTE ONLY): 23 min  Charges:  $Therapeutic Activity: 23-37 mins                     Kittie Plater, PT, DPT Acute Rehabilitation Services Pager #: [phone] Office #: [phone]    Berline Lopes 11/22/2018, 1:02 PM

## 2018-11-22 NOTE — Consult Note (Signed)
Chief Complaint: Patient was seen in consultation today for random renal biopsy Chief Complaint  Patient presents with   Fever   at the request of Dr Corliss Marcus   Supervising Physician: Sandi Mariscal  Patient Status: [facility] - In-pt  History of Present Illness: Audrey Collier is a 58 y.o. female   Dr Augustin Coupe Note this am: Acute kidney injury. Remains nonoliguric. Cr in the mid 3s. 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 Serologies and complements negative. Discussed with aunt [name] at bedside that biopsy may be warranted-->she is in agreement. UP/C 6.5, no history of DM.  Planning on a renal bx for a definitive diagnosis.   Request for random renal biopsy Renal bx form in chart   Past Medical History:  Diagnosis Date   Anxiety    Dizziness    Endometrial polyp 03/26/2014   Fecal occult blood test positive 03/15/2013   will send 3 cards home and refer to GI for colonoscopy   GERD (gastroesophageal reflux disease)    Hypothyroidism    Mental disorder    mentally challenged   PMB (postmenopausal bleeding) 03/26/2014   Thickened endometrium 03/26/2014   ?polyp will get HSG    Past Surgical History:  Procedure Laterality Date   AORTIC VALVE REPLACEMENT N/A 10/07/2018   Procedure: AORTIC VALVE REPLACEMENT (AVR);  Surgeon: Lajuana Matte, MD;  Location: [place];  Service: Open Heart Surgery;  Laterality: N/A;   BIOPSY  11/03/2018   Procedure: BIOPSY;  Surgeon: Thornton Park, MD;  Location: [place];  Service: Gastroenterology;;   CATARACT EXTRACTION W/PHACO Left 08/06/2017   Procedure: CATARACT EXTRACTION PHACO AND INTRAOCULAR LENS PLACEMENT LEFT EYE;  Surgeon: Baruch Goldmann, MD;  Location: [facility];  Service: Ophthalmology;  Laterality: Left;  CDE: 12.63   CATARACT EXTRACTION W/PHACO Right 10/01/2017   Procedure: CATARACT EXTRACTION PHACO AND INTRAOCULAR LENS PLACEMENT (IOC);  Surgeon: Baruch Goldmann, MD;  Location: [facility];  Service: Ophthalmology;  Laterality:  Right;  CDE: 5.77   COLONOSCOPY N/A 12/01/2013   Dr. Fields:moderate sized internal hemorrhoids/left colon is redundant   [place] N/A 04/18/2014   Procedure: DILATATION & CURETTAGE/HYSTEROSCOPY WITH NOVASURE ENDOMETRIAL ABLATION;  Surgeon: Florian Buff, MD;  Location: [facility];  Service: Gynecology;  Laterality: N/A;  Uterine Cavity Length 5cm      ESOPHAGOGASTRODUODENOSCOPY N/A 12/01/2013   EXB:MWUX non erosive gastritis. +H.pylori    ESOPHAGOGASTRODUODENOSCOPY (EGD) WITH PROPOFOL N/A 11/03/2018   Procedure: ESOPHAGOGASTRODUODENOSCOPY (EGD) WITH PROPOFOL;  Surgeon: Thornton Park, MD;  Location: [place];  Service: Gastroenterology;  Laterality: N/A;   IR GASTROSTOMY TUBE MOD SED  11/04/2018   NO PAST SURGERIES     POLYPECTOMY N/A 04/18/2014   Procedure: ENDOMETRIAL POLYPECTOMY;  Surgeon: Florian Buff, MD;  Location: [facility];  Service: Gynecology;  Laterality: N/A;   TEE WITHOUT CARDIOVERSION N/A 10/07/2018   Procedure: TRANSESOPHAGEAL ECHOCARDIOGRAM (TEE);  Surgeon: Lajuana Matte, MD;  Location: [place];  Service: Open Heart Surgery;  Laterality: N/A;    Allergies: Patient has no known allergies.  Medications: Prior to Admission medications   Medication Sig Start Date End Date Taking? Authorizing Provider  Calcium Citrate-Vitamin D (CALCIUM + D PO) Take 1 tablet by mouth daily.   Yes [provider]  cetirizine (ZYRTEC) 10 MG tablet Take 10 mg by mouth daily.   Yes [provider]  diclofenac sodium (VOLTAREN) 1 % GEL Apply 1 application topically every 6 (six) hours as needed (heel pain).  Yes [provider]  docusate sodium (STOOL SOFTENER) 100 MG capsule Take 100 mg by mouth every Monday, Wednesday, and Friday.   Yes [provider]  FLUoxetine (PROZAC) 10 MG tablet Take 10 mg by mouth at bedtime.    Yes [provider]  fluticasone (FLONASE) 50 MCG/ACT nasal spray Place 1  spray into both nostrils 2 (two) times daily as needed for allergies.    Yes [provider]  levothyroxine (SYNTHROID) 125 MCG tablet Take 125 mcg by mouth daily before breakfast.    Yes [provider]  montelukast (SINGULAIR) 10 MG tablet Take 10 mg by mouth at bedtime.   Yes [provider]  Multiple Vitamin (MULTIVITAMIN) tablet Take 1 tablet by mouth daily.   Yes [provider]  omeprazole (PRILOSEC) 20 MG capsule TAKE 1 CAPSULE BY MOUTH ONCE DAILY. 07/23/15  Yes Annitta Needs, NP  rosuvastatin (CRESTOR) 5 MG tablet Take 5 mg by mouth daily.   Yes [provider]  aspirin EC 325 MG EC tablet Take 1 tablet (325 mg total) by mouth daily. 10/21/18   Nani Skillern, PA-C  ceFAZolin (ANCEF) IVPB Inject 2 g into the vein every 8 (eight) hours. Indication: MSSA endocarditis Last Day of Therapy:  11/17/18 Labs - Once weekly:  CBC/D and BMP, Labs - Every other week:  ESR and CRP 10/20/18 11/25/18  Lars Pinks M, PA-C  ferrous sulfate 325 (65 FE) MG tablet Take 1 tablet (325 mg total) by mouth daily with breakfast. For one month then stop. 10/21/18   Nani Skillern, PA-C  metolazone (ZAROXOLYN) 5 MG tablet Take 1 tablet (5 mg total) by mouth daily. 10/21/18   Nani Skillern, PA-C  metoprolol tartrate (LOPRESSOR) 25 MG tablet Take 1 tablet (25 mg total) by mouth 2 (two) times daily. 10/20/18   Nani Skillern, PA-C  potassium chloride SA (K-DUR) 20 MEQ tablet Take 1 tablet (20 mEq total) by mouth daily. 10/21/18   Nani Skillern, PA-C     Family History  Problem Relation Age of Onset   Diabetes Maternal Aunt    Hypertension Maternal Aunt    Diabetes Maternal Uncle    Colon cancer Neg Hx     Social History   Socioeconomic History   Marital status: Single    Spouse name: Not on file   Number of children: Not on file   Years of education: Not on file   Highest education level: Not on file  Occupational  History   Not on file  Social Needs   Financial resource strain: Not on file   Food insecurity    Worry: Not on file    Inability: Not on file   Transportation needs    Medical: Not on file    Non-medical: Not on file  Tobacco Use   Smoking status: Never Smoker   Smokeless tobacco: Never Used   Tobacco comment: Never smoked  Substance and Sexual Activity   Alcohol use: No    Alcohol/week: 0.0 standard drinks   Drug use: No   Sexual activity: Never  Lifestyle   Physical activity    Days per week: Not on file    Minutes per session: Not on file   Stress: Not on file  Relationships   Social connections    Talks on phone: Not on file    Gets together: Not on file    Attends religious service: Not on file    Active member of  club or organization: Not on file    Attends meetings of clubs or organizations: Not on file    Relationship status: Not on file  Other Topics Concern   Not on file  Social History Narrative   Not on file     Review of Systems: A 12 point ROS discussed and pertinent positives are indicated in the HPI above.  All other systems are negative.   Vital Signs: BP (!) 149/77 (BP Location: Right Arm)    Pulse 89    Temp 99 F (37.2 C) (Oral)    Resp 20    Ht '5\' 2"'$  (1.575 m)    Wt 199 lb 8.3 oz (90.5 kg)    LMP 04/16/2013 (Approximate)    SpO2 95%    BMI 36.49 kg/m   Physical Exam Vitals signs reviewed.  Constitutional:      Appearance: She is obese.  Cardiovascular:     Rate and Rhythm: Normal rate and regular rhythm.     Heart sounds: Normal heart sounds.  Pulmonary:     Effort: Pulmonary effort is normal.     Breath sounds: Normal breath sounds.  Abdominal:     Palpations: Abdomen is soft.  Musculoskeletal:     Comments: Can  Move all 4s Little movement Pt with little response  Skin:    General: Skin is warm and dry.  Neurological:     Mental Status: Mental status is at baseline.  Psychiatric:     Comments: Consented with  Delton Coombes via phone      Imaging: Ct Abdomen Pelvis Wo Contrast  Result Date: 11/07/2018 CLINICAL DATA:  Abdominal pain and bleeding after percutaneous gastrostomy tube placement. EXAM: CT ABDOMEN AND PELVIS WITHOUT CONTRAST TECHNIQUE: Multidetector CT imaging of the abdomen and pelvis was performed following the standard protocol without IV contrast. COMPARISON:  Percutaneous gastrostomy tube placement-11/04/2018 FINDINGS: The lack of intravenous contrast limits the ability to evaluate solid abdominal organs. Lower chest: Limited visualization of the lower thorax demonstrates a small right and trace left-sided pleural effusions with associated bibasilar heterogeneous/consolidative opacities, similar to the 10/31/2018 examination. Borderline cardiomegaly. There is diffuse decreased attenuation intra cardiac blood pool suggestive of anemia. There is a minimal amount of pericardial thickening without associated effusion. Post median sternotomy with adjacent stranding. Hepatobiliary: Normal hepatic contour. Normal noncontrast appearance of the gallbladder given degree distention. No radiopaque gallstones. There is a trace amount of perihepatic ascites, similar to the 10/31/2018 examination. Pancreas: Normal noncontrast appearance of the pancreas. Spleen: Normal noncontrast appearance of the spleen. Adrenals/Urinary Tract: Mild bilateral pelviectasis without associated caliectasis, left greater than right, similar to the 10/2018 examination. Minimal amount of grossly symmetric bilateral perinephric stranding, likely age and body habitus related. No renal stones. No renal stones are seen along expected course of either ureter or the urinary bladder. There is mild thickening of the left adrenal gland without discrete nodule. Normal noncontrast appearance of the right adrenal gland. Thickening of the urinary bladder wall, potentially accentuated due to underdistention in the setting of a Foley catheter.  Stomach/Bowel: Interval placement disc retention gastrostomy tube which appears appropriately positioned within the stomach. There are scattered foci of pneumoperitoneum within the upper abdomen presumably the sequela of recent gastrostomy tube placement. Radiopaque enteric contrast has been ingested and is seen extending to the level of the transverse colon. Large stool burden within the rectal vault without evidence of enteric obstruction. The cecum is noted to be located within the right mid hemiabdomen. Normal appearance  of the chest at normal noncontrast appearance of the terminal ileum and appendix. No discrete areas of bowel wall thickening. No pneumatosis or portal venous gas. Vascular/Lymphatic: Normal caliber the abdominal aorta. 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 No bulky retroperitoneal, mesenteric, pelvic or inguinal lymphadenopathy on this noncontrast examination. Reproductive: Normal noncontrast appearance of the pelvic organs for age. No discrete adnexal lesion. Small amount of free fluid within the pelvic cul-de-sac. Other: Diffuse body wall anasarca. Musculoskeletal: No acute or aggressive osseous abnormalities. IMPRESSION: 1. Appropriately positioned percutaneous gastrostomy tube without definitive evidence of complication on this noncontrast examination. There is a small amount of residual pneumoperitoneum however this is considered to be an expected finding following percutaneous gastrostomy tube placement performed 11/04/2019. 2. Large stool burden within the rectal vault without evidence of enteric obstruction. 3. Cardiomegaly with findings suggestive of pulmonary edema including small right and trace left-sided pleural effusions, diffuse body wall anasarca and small amount of intra-abdominal ascites. 4. The urinary bladder remains decompressed via Foley catheter though appears thick wall with associated mild bilateral pelviectasis, nonspecific  though could be seen in the setting urinary tract infection. Correlation with urinalysis is advised. Electronically Signed   By: Sandi Mariscal M.D.   On: 11/07/2018 14:25   Ct Abdomen Wo Contrast  Result Date: 10/31/2018 CLINICAL DATA:  Evaluate anatomy for percutaneous gastrostomy tube placement. History of aortic valve replacement. Poor appetite and nutrition. EXAM: CT ABDOMEN WITHOUT CONTRAST TECHNIQUE: Multidetector CT imaging of the abdomen was performed following the standard protocol without IV contrast. COMPARISON:  CT chest 10/05/2018.  CT abdomen 12/05/2014 FINDINGS: Lower chest: Small to moderate sized right pleural effusion with compressive atelectasis in the right lower lobe. Aortic valve replacement. Patchy airspace densities in left lower lobe are poorly visualized due to motion artifact. Findings are suggestive for infectious or inflammatory process. Hepatobiliary: Gallbladder is at least moderately distended and appears to be contiguous with a small amount of perihepatic fluid. There was perihepatic fluid and gallbladder distention on the CT from 10/05/2018. Perihepatic fluid may be loculated with a component along the right inferior aspect of the liver and another component along the anterior liver and adjacent to the gallbladder. Difficult to exclude gallbladder inflammation. Limited evaluation for a hepatic lesion on this non contrast examination. Pancreas: Unremarkable. No pancreatic ductal dilatation or surrounding inflammatory changes. Spleen: Normal in size without focal abnormality. Adrenals/Urinary Tract: Normal adrenal glands. Normal appearance of both kidneys. No hydronephrosis. Stomach/Bowel: Nasogastric tube tip in the distal stomach body region. Transverse colon is caudal to the stomach. There should be a percutaneous window for gastrostomy tube placement after stomach insufflation. No evidence for bowel dilatation. Mild mesenteric edema in the right upper abdomen near the liver and  duodenum. Vascular/Lymphatic: Visualized vascular structures are unremarkable. No significant lymph node enlargement. Other: Subcutaneous edema.  Negative for free air. Musculoskeletal: Prior median sternotomy. IMPRESSION: 1. Anatomy should be amendable for percutaneous gastrostomy tube placement. Nasogastric tube tip in the distal stomach body region. 2. Perihepatic fluid collections with at least mild gallbladder distension. One perihepatic collection appears to be contiguous with the gallbladder and cannot exclude gallbladder inflammation. Recommend further characterization of the gallbladder and perihepatic space with a right upper quadrant ultrasound. 3. Right pleural effusion with compressive atelectasis in the right lower lobe. Poorly characterized patchy densities in left lower lobe due to motion artifact but concerning for residual inflammation or infection in left lower lobe. Electronically Signed   By: Markus Daft  M.D.   On: 10/31/2018 15:29   Nm Hepatobiliary Liver Func  Result Date: 11/01/2018 CLINICAL DATA:  Right upper quadrant pain EXAM: NUCLEAR MEDICINE HEPATOBILIARY IMAGING TECHNIQUE: Sequential images of the abdomen were obtained out to 60 minutes following intravenous administration of radiopharmaceutical. 3 mg of morphine administered and additional images for 30 minutes obtained. RADIOPHARMACEUTICALS:  5.1 mCi Tc-94m Choletec IV COMPARISON:  Ultrasound 11/01/2018, CT 10/31/2018 FINDINGS: Prompt uptake and biliary excretion of activity by the liver is seen. Gallbladder is nonvisualized after 60 minutes of imaging. Following intravenous administration of morphine, there is visualization of the gallbladder within 30 minutes period. Biliary activity passes into small bowel, consistent with patent common bile duct. IMPRESSION: Negative for acute cholecystitis. Electronically Signed   By: KDonavan FoilM.D.   On: 11/01/2018 19:31   Ir Gastrostomy Tube Mod Sed  Result Date: 11/04/2018 CLINICAL  DATA:  Malnutrition and need for percutaneous gastrostomy tube. EXAM: PERCUTANEOUS GASTROSTOMY TUBE PLACEMENT ANESTHESIA/SEDATION: 1.0 mg IV Versed; 50 mcg IV Fentanyl. Total Moderate Sedation Time 15 minutes. The patient's level of consciousness and physiologic status were continuously monitored during the procedure by Radiology nursing. CONTRAST:  150mOMNIPAQUE IOHEXOL 300 MG/ML  SOLN MEDICATIONS: 2 g IV Ancef. IV antibiotic was administered in an appropriate time interval prior to needle puncture of the skin. FLUOROSCOPY TIME:  6 minutes and 48 seconds.  38.4 mGy. PROCEDURE: The procedure, risks, benefits, and alternatives were explained to the patient's aunt. Questions regarding the procedure were encouraged and answered. The patient's aunt understands and consents to the procedure. A 5-French catheter was then advanced through the patient's mouth under fluoroscopy into the esophagus and to the level of the stomach. This catheter was used to insufflate the stomach with air under fluoroscopy. The abdominal wall was prepped with chlorhexidine in a sterile fashion, and a sterile drape was applied covering the operative field. A sterile gown and sterile gloves were used for the procedure. Local anesthesia was provided with 1% Lidocaine. A skin incision was made in the upper abdominal wall. Under fluoroscopy, an 18 gauge trocar needle was advanced into the stomach. Contrast injection was performed to confirm intraluminal position of the needle tip. A single T tack was then deployed in the lumen of the stomach. This was brought up to tension at the skin surface. Over a guidewire, a 9-French sheath was advanced into the lumen of the stomach. The wire was left in place as a safety wire. A loop snare device from a percutaneous gastrostomy kit was then advanced into the stomach. A floppy guide wire was advanced through the orogastric catheter under fluoroscopy in the stomach. The loop snare advanced through the  percutaneous gastric access was used to snare the guide wire. This allowed withdrawal of the loop snare out of the patient's mouth by retraction of the orogastric catheter and wire. A 20-French bumper retention gastrostomy tube was looped around the snare device. It was then pulled back through the patient's mouth. The retention bumper was brought up to the anterior gastric wall. The T tack suture was cut at the skin. The exiting gastrostomy tube was cut to appropriate length and a feeding adapter applied. The catheter was injected with contrast material to confirm position and a fluoroscopic spot image saved. The tube was then flushed with saline. A dressing was applied over the gastrostomy exit site. COMPLICATIONS: None. FINDINGS: The stomach distended well with air allowing safe placement of the gastrostomy tube. After placement, the tip of the gastrostomy tube  lies in the body of the stomach. IMPRESSION: Percutaneous gastrostomy with placement of a 20-French bumper retention tube in the body of the stomach. This tube can be used for percutaneous feeds beginning in 24 hours after placement. Electronically Signed   By: Aletta Edouard M.D.   On: 11/04/2018 17:04   US Abdomen Limited  Result Date: 11/01/2018 CLINICAL DATA:  Abdominal pain EXAM: ULTRASOUND ABDOMEN LIMITED RIGHT UPPER QUADRANT COMPARISON:  CT abdomen and pelvis 10/31/2018 FINDINGS: Gallbladder: Mildly distended gallbladder containing a 15 mm gallstone and small amount of sludge. Upper normal gallbladder wall thickness. Perihepatic and pericholecystic fluid identified. No sonographic Murphy sign. Common bile duct: Diameter: 5 mm Liver: Normal echogenicity without mass or nodularity. Portal vein is patent on color Doppler imaging with normal direction of blood flow towards the liver. Other: Perihepatic and pericholecystic free fluid. Small focal fluid collection identified adjacent to the gallbladder 6.7 x 1.9 x 4.6 cm. IMPRESSION: Mildly distended  gallbladder with a 15 mm gallstone, small amount of sludge, and pericholecystic and perihepatic free fluid. No definite sonographic Percell Miller sign is identified; however, cannot exclude acute cholecystitis. Consider follow-up radionuclide hepatobiliary imaging to exclude acute cholecystitis. Electronically Signed   By: Lavonia Dana M.D.   On: 11/01/2018 07:48   Dg Chest Port 1 View  Result Date: 11/18/2018 CLINICAL DATA:  Fever EXAM: PORTABLE CHEST 1 VIEW COMPARISON:  11/14/2018 FINDINGS: No significant change in [facility] portable examination with mild diffuse bilateral interstitial pulmonary opacity and a probable small layering right pleural effusion. There is no new or focal airspace opacity. Cardiomegaly status post median sternotomy with aortic valve prosthesis. Left upper extremity PICC. IMPRESSION: No significant change in AP portable examination with mild diffuse bilateral interstitial pulmonary opacity consistent with edema or infection, and a probable small layering right pleural effusion. There is no new or focal airspace opacity. Electronically Signed   By: Eddie Candle M.D.   On: 11/18/2018 20:14   Dg Chest Port 1 View  Result Date: 11/14/2018 CLINICAL DATA:  58 year old female with chest pain after aortic valve replacement EXAM: PORTABLE CHEST 1 VIEW COMPARISON:  November 07, 2018 FINDINGS: Cardiomediastinal silhouette unchanged in size and contour. Surgical changes of median sternotomy and aortic valve replacement. Unchanged left upper extremity PICC. Similar appearance of low lung volumes and mild central vascular congestion with interlobular septal thickening. Blunting of the right costophrenic angle and cardiophrenic angle. Improved aeration at the left lung base. No displaced fracture IMPRESSION: Similar appearance of the chest x-ray with evidence of mild pulmonary edema and small right pleural effusion with improving aeration at the left lung base. Surgical changes of median sternotomy and aortic  valve repair. Unchanged left upper extremity PICC Electronically Signed   By: Corrie Mckusick D.O.   On: 11/14/2018 07:54   Dg Chest Port 1 View  Result Date: 11/07/2018 CLINICAL DATA:  S/P AVR abdo pain with distention PPE: I donned gloves, surgical mask EXAM: PORTABLE CHEST 1 VIEW COMPARISON:  Chest radiograph 10/15/2018, 10/12/2018 FINDINGS: Stable cardiomediastinal contours status post median sternotomy and valvular replacement. Interval placement of a left upper extremity PICC with tip projecting over the cavoatrial junction. There are new bilateral diffuse patchy airspace opacities and small bilateral pleural effusions. No pneumothorax. No acute finding in the visualized skeleton. IMPRESSION: 1. New bilateral diffuse patchy airspace opacities and small bilateral pleural effusions suspicious for pulmonary edema, infection not excluded. 2. Left upper extremity PICC tip projects over the cavoatrial junction. Electronically Signed   By: Audie Pinto  M.D.   On: 11/07/2018 10:54   Dg Abd Portable 1v  Result Date: 11/07/2018 CLINICAL DATA:  Abdominal pain and distention. EXAM: PORTABLE ABDOMEN - 1 VIEW COMPARISON:  Plain films of the abdomen 10/21/2018. FINDINGS: Peg noted. The bowel gas pattern is normal. No radio-opaque calculi or other significant radiographic abnormality are seen. IMPRESSION: No acute abnormality. Electronically Signed   By: Inge Rise M.D.   On: 11/07/2018 09:28   US Abdomen Limited Ruq  Result Date: 11/07/2018 CLINICAL DATA:  58 year old female with right upper quadrant abdominal pain. EXAM: ULTRASOUND ABDOMEN LIMITED RIGHT UPPER QUADRANT COMPARISON:  Abdominal CT dated 11/07/2018 and ultrasound dated 11/01/2018 FINDINGS: Gallbladder: There is sludge and a 2 cm stone in the gallbladder. There is no gallbladder wall thickening. No sonographic Murphy's sign. Small pericholecystic fluid as well as perihepatic fluid noted. Common bile duct: Diameter: 5 mm Liver: Slightly nodular  liver contour in keeping with early changes of cirrhosis. Portal vein is patent on color Doppler imaging with normal direction of blood flow towards the liver. Other: Partially visualized right pleural effusion. IMPRESSION: 1. Cholelithiasis without sonographic evidence of acute cholecystitis. 2. Probable early changes of cirrhosis. 3. Right-sided pleural effusion and small perihepatic ascites. Electronically Signed   By: Anner Crete M.D.   On: 11/07/2018 19:04    Labs:  CBC: Recent Labs    11/18/18 1039 11/19/18 0532 11/20/18 1433 11/22/18 0500  WBC 14.0* 16.7* 15.3* 11.1*  HGB 8.9* 8.5* 8.3* 7.9*  HCT 28.3* 28.2* 27.1* 26.9*  PLT 99* 102* 122* 144*    COAGS: Recent Labs    10/01/18 1820 10/07/18 1330 11/03/18 1735  INR 1.4* 1.7* 1.3*  APTT 55* 39*  --     BMP: Recent Labs    11/18/18 1039 11/19/18 0532 11/20/18 1433 11/22/18 0500  NA 142 140 143 143  K 4.6 4.2 4.4 4.2  CL 103 103 104 104  CO2 _0 GLUCOSE 116* 124* 118* 135*  BUN 93* 96* 95* 104*  CALCIUM 9.7 9.8 9.8 9.2  CREATININE 3.22* 3.11* 3.17* 3.20*  GFRNONAA 15* 16* 15* 15*  GFRAA 18* 18* 18* 18*    LIVER FUNCTION TESTS: Recent Labs    11/01/18 0530 11/07/18 0812  11/12/18 0353 11/19/18 0532 11/20/18 1433 11/22/18 0500  BILITOT 1.0 0.5  --   --   --  0.8 0.7  AST 39 22  --   --   --  61* 433*  ALT 8 <5  --   --   --  9 77*  ALKPHOS 192* 154*  --   --   --  334* 535*  PROT 5.4* 4.5*  --   --   --  6.1* 5.5*  ALBUMIN 1.6* 1.3*   < > 1.6* 1.8* 1.8* 1.8*   < > = values in this interval not displayed.    TUMOR MARKERS: No results for input(s): AFPTM, CEA, CA199, CHROMGRNA in the last 8760 hours.  Assessment and Plan:  Random renal biopsy per Nephrology Nonoliguric Acute renal injury Scheduled for bx today in Rad  Risks and benefits of random renal bx was discussed with the patient and/or patient's family including, but not limited to bleeding, infection, damage to adjacent  structures or low yield requiring additional tests.  All of the questions were answered and there is agreement to proceed. Consent signed and in chart.  Thank you for this interesting consult.  I greatly enjoyed meeting MAILEN NEWBORN and look forward  to participating in their care.  A copy of this report was sent to the requesting provider on this date.  Electronically Signed: Lavonia Drafts, PA-C 11/22/2018, 8:21 AM   I spent a total of 20 Minutes    in face to face in clinical consultation, greater than 50% of which was counseling/coordinating care for random renal bx

## 2018-11-22 NOTE — Progress Notes (Addendum)
      [address]       [place] [address]             [phone]      19 Days Post-Op Procedure(s) (LRB): ESOPHAGOGASTRODUODENOSCOPY (EGD) WITH PROPOFOL (N/A) BIOPSY   Subjective:  Patient with belly pain this morning.  Per nursing had a large BM this morning.  She got up and to the chair yesterday and said it felt good getting out of bed.  Objective: Vital signs in last 24 hours: Temp:  [98.3 F (36.8 C)-100 F (37.8 C)] 99 F (37.2 C) (09/22 0750) Pulse Rate:  [83-96] 89 (09/22 0750) Cardiac Rhythm: Normal sinus rhythm (09/21 2000) Resp:  [19-33] 20 (09/22 0750) BP: (148-153)/(77-84) 149/77 (09/22 0750) SpO2:  [92 %-100 %] 95 % (09/22 0750) Weight:  [90.5 kg] 90.5 kg (09/22 0515)  Intake/Output from previous day: 09/21 0701 - 09/22 0700 In: 60  Out: 450 [Urine:450]  General appearance: alert, cooperative and no distress Heart: regular rate and rhythm Lungs: clear to auscultation bilaterally Abdomen: soft, non-tender; bowel sounds normal; no masses,  no organomegaly Extremities: edema trace, improving Wound: clean and dry  Lab Results: Recent Labs    11/20/18 1433 11/22/18 0500  WBC 15.3* 11.1*  HGB 8.3* 7.9*  HCT 27.1* 26.9*  PLT 122* 144*   BMET:  Recent Labs    11/20/18 1433 11/22/18 0500  NA 143 143  K 4.4 4.2  CL 104 104  CO2 28 28  GLUCOSE 118* 135*  BUN 95* 104*  CREATININE 3.17* 3.20*  CALCIUM 9.8 9.2    PT/INR: No results for input(s): LABPROT, INR in the last 72 hours. ABG    Component Value Date/Time   PHART 7.430 10/08/2018 0920   HCO3 23.6 10/08/2018 0920   TCO2 25 10/08/2018 0920   ACIDBASEDEF 6.0 (H) 10/08/2018 0307   O2SAT 94.0 10/08/2018 0920   CBG (last 3)  Recent Labs    11/21/18 1621 11/21/18 2006 11/22/18 0425  GLUCAP 113* 118* 132*    Assessment/Plan: S/P Procedure(s) (LRB): ESOPHAGOGASTRODUODENOSCOPY (EGD) WITH PROPOFOL (N/A) BIOPSY  1. CV- hemodynamically stable 2. Pulm- no acute  issues, off oxygen 3. Renal- AIN, creatinine remains stable, Nephrology following, for possible renal biopsy soon 4. GI- oral intake remains poor, tube feeds adjusted per dietary 5. Deconditioning- continue aggressive PT/OT, nursing to get patient up to chair again today 6. Dispo- patient continues to have no bed offers, got up to chair yesterday, nursing will repeat today, Nephrology managing AIN,   LOS: 52 days    Ellwood Handler 11/22/2018  Agree with above Surgical issues resolved.  Wounds healed, off abx Needs placement  Jammi Morrissette O Audrick Lamoureaux

## 2018-11-22 NOTE — Progress Notes (Signed)
Upon arrival to IR nurses station, pt began to vomit. Pt was suctioned and HOB raised. Changed linens. Pt O2 sat 80%, placed pt on venti mask at 4L pt O2 sat increased to 96%. Per orders gave pt 4mg  zofran IV. All other vitals stable. CXR performed at bedside. Informed floor RN of events; we will postpone until tomorrow and revisit if pt is stable for biopsy. Pt vitals improved and lowered O2 need to [facility]. Floor RN aware of situation and pt transported back to room.

## 2018-11-22 NOTE — Progress Notes (Signed)
[facility] Progress Note    Assessment/ Plan:   1.  Acute kidney injury.  Remains nonoliguric.  Cr in the mid 3s.  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  Serologies and complements negative.  Discussed with aunt Kendal Hymen at bedside that biopsy may be warranted--> she is in agreement.  UP/C 6.5, no history of DM.  Sending A1c--> 4.9 on 9/19.  Cr fluctuating but no trending down consistently.   - Planning on a renal bx for a definitive diagnosis.    2.  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 Completed antibiotics  3.  Severe hypoalbuminemia.  Continue enteral nutrition as able, getting meds for constipation -> BM this AM.  4.  Cognitive delay.  5.  Anemia.  Hemoglobin stable for now  6.  Hyperkalemia.  Lokelma prn.  7.  Anasarca: 40 IV Lasix 9/18 and 9/19.  Alb low so expect continued third spacing and don't expect it to completely resolve.    - Appears overloaded -> will give another dose of Lasix IV today.  8.  Leukocytosis/ low-grade fever: WBC ct up,  and Tmax 100 yest everning.   Subjective:   Denies f/c/n/v/dyspnea but appears more dyspneic.   Objective:   BP (!) 153/84 (BP Location: Right Arm)   Pulse 88   Temp 99.5 F (37.5 C) (Oral)   Resp (!) 21   Ht 5\' 2"  (1.575 m)   Wt 90.5 kg   LMP 04/16/2013 (Approximate)   SpO2 95%   BMI 36.49 kg/m   Intake/Output Summary (Last 24 hours) at 11/22/2018 0656 Last data filed at 11/21/2018 2100 Gross per 24 hour  Intake 60 ml  Output 450 ml  Net -390 ml   Weight change: -1 kg  Physical Exam: General: Awake, pleasant, tachypneic HEENT: some red rash on cheeks L > R Neck:  + JVD, no adenopathy CV:  Heart RRR  Lungs:  Rales bibasilar Abd:  abd slightly distended, nontender with normal BS Extremities: +3 upper and lower extremity edema Skin: erythematous rash on cheeks- doesn't cross nasolabial folds  Imaging: No results found.  Labs: BMET Recent Labs  Lab 11/15/18 0941  11/16/18 0945 11/17/18 0447 11/18/18 1039 11/19/18 0532 11/20/18 1433 11/22/18 0500  NA 140 141 143 142 140 143 143  K 4.8 5.1 4.7 4.6 4.2 4.4 4.2  CL 106 105 106 103 103 104 104  CO2 22 24 27 27 26 28 28   GLUCOSE 142* 113* 124* 116* 124* 118* 135*  BUN 92* 97* 97* 93* 96* 95* 104*  CREATININE 3.31* 3.31* 3.30* 3.22* 3.11* 3.17* 3.20*  CALCIUM 8.8* 9.4 9.4 9.7 9.8 9.8 9.2  PHOS  --   --   --   --  5.5*  --   --    CBC Recent Labs  Lab 11/18/18 1039 11/19/18 0532 11/20/18 1433 11/22/18 0500  WBC 14.0* 16.7* 15.3* 11.1*  HGB 8.9* 8.5* 8.3* 7.9*  HCT 28.3* 28.2* 27.1* 26.9*  MCV 97.9 98.3 98.9 99.3  PLT 99* 102* 122* 144*    Medications:    . sodium chloride   Intravenous Once  . amLODipine  5 mg Oral Daily  . calcium-vitamin D  1 tablet Oral Q breakfast  . Chlorhexidine Gluconate Cloth  6 each Topical Daily  . feeding supplement  1 Container Oral TID BM  . feeding supplement (OSMOLITE 1.5 CAL)  1,000 mL Per Tube Q24H  . feeding supplement (PRO-STAT SUGAR FREE 64)  30 mL Per  Tube TID  . FLUoxetine  10 mg Oral Daily  . levothyroxine  150 mcg Oral Q0600  . mouth rinse  15 mL Mouth Rinse BID  . metoCLOPramide (REGLAN) injection  10 mg Intravenous Q6H  . metoprolol tartrate  25 mg Oral BID  . montelukast  10 mg Oral QHS  . multivitamin  15 mL Oral Daily  . rosuvastatin  5 mg Oral q1800  . sodium chloride flush  3 mL Intravenous Q12H  . tamsulosin  0.4 mg Oral Daily      Otelia Santee, MD 11/22/2018, 6:56 AM

## 2018-11-23 ENCOUNTER — Inpatient Hospital Stay (HOSPITAL_COMMUNITY): Payer: Medicaid Other

## 2018-11-23 DIAGNOSIS — R601 Generalized edema: Secondary | ICD-10-CM

## 2018-11-23 DIAGNOSIS — E8809 Other disorders of plasma-protein metabolism, not elsewhere classified: Secondary | ICD-10-CM

## 2018-11-23 DIAGNOSIS — R945 Abnormal results of liver function studies: Secondary | ICD-10-CM

## 2018-11-23 DIAGNOSIS — E43 Unspecified severe protein-calorie malnutrition: Secondary | ICD-10-CM

## 2018-11-23 DIAGNOSIS — L8990 Pressure ulcer of unspecified site, unspecified stage: Secondary | ICD-10-CM

## 2018-11-23 DIAGNOSIS — R1110 Vomiting, unspecified: Secondary | ICD-10-CM

## 2018-11-23 LAB — COMPREHENSIVE METABOLIC PANEL
ALT: 79 U/L — ABNORMAL HIGH (ref 0–44)
AST: 242 U/L — ABNORMAL HIGH (ref 15–41)
Albumin: 1.8 g/dL — ABNORMAL LOW (ref 3.5–5.0)
Alkaline Phosphatase: 530 U/L — ABNORMAL HIGH (ref 38–126)
Anion gap: 10 (ref 5–15)
BUN: 110 mg/dL — ABNORMAL HIGH (ref 6–20)
CO2: 28 mmol/L (ref 22–32)
Calcium: 9.2 mg/dL (ref 8.9–10.3)
Chloride: 105 mmol/L (ref 98–111)
Creatinine, Ser: 3.22 mg/dL — ABNORMAL HIGH (ref 0.44–1.00)
GFR calc Af Amer: 18 mL/min — ABNORMAL LOW (ref >60)
GFR calc non Af Amer: 15 mL/min — ABNORMAL LOW (ref >60)
Glucose, Bld: 112 mg/dL — ABNORMAL HIGH (ref 70–99)
Potassium: 4.4 mmol/L (ref 3.5–5.1)
Sodium: 143 mmol/L (ref 135–145)
Total Bilirubin: 0.7 mg/dL (ref 0.3–1.2)
Total Protein: 5.5 g/dL — ABNORMAL LOW (ref 6.5–8.1)

## 2018-11-23 LAB — CBC
HCT: 26.8 % — ABNORMAL LOW (ref 36.0–46.0)
Hemoglobin: 8.1 g/dL — ABNORMAL LOW (ref 12.0–15.0)
MCH: 30.2 pg (ref 26.0–34.0)
MCHC: 30.2 g/dL (ref 30.0–36.0)
MCV: 100 fL (ref 80.0–100.0)
Platelets: 127 10*3/uL — ABNORMAL LOW (ref 150–400)
RBC: 2.68 MIL/uL — ABNORMAL LOW (ref 3.87–5.11)
RDW: 15.4 % (ref 11.5–15.5)
WBC: 9.7 10*3/uL (ref 4.0–10.5)
nRBC: 0.2 % (ref 0.0–0.2)

## 2018-11-23 LAB — GLUCOSE, CAPILLARY
Glucose-Capillary: 107 mg/dL — ABNORMAL HIGH (ref 70–99)
Glucose-Capillary: 65 mg/dL — ABNORMAL LOW (ref 70–99)
Glucose-Capillary: 95 mg/dL (ref 70–99)
Glucose-Capillary: 97 mg/dL (ref 70–99)

## 2018-11-23 LAB — CULTURE, BLOOD (ROUTINE X 2)
Culture: NO GROWTH
Culture: NO GROWTH
Special Requests: ADEQUATE
Special Requests: ADEQUATE

## 2018-11-23 LAB — TSH: TSH: 8.35 u[IU]/mL — ABNORMAL HIGH (ref 0.350–4.500)

## 2018-11-23 MED ORDER — POLYETHYLENE GLYCOL 3350 17 G PO PACK
17.0000 g | PACK | ORAL | Status: DC
  Administered 2018-11-23 – 2018-12-05 (×5): 17 g
  Filled 2018-11-23 (×8): qty 1

## 2018-11-23 MED ORDER — LIDOCAINE HCL (PF) 1 % IJ SOLN
INTRAMUSCULAR | Status: AC
  Filled 2018-11-23: qty 30

## 2018-11-23 MED ORDER — HYDRALAZINE HCL 20 MG/ML IJ SOLN
INTRAMUSCULAR | Status: AC
  Filled 2018-11-23: qty 1

## 2018-11-23 MED ORDER — FENTANYL CITRATE (PF) 100 MCG/2ML IJ SOLN
INTRAMUSCULAR | Status: AC | PRN
  Administered 2018-11-23: 25 ug via INTRAVENOUS

## 2018-11-23 MED ORDER — ONDANSETRON HCL 4 MG/2ML IJ SOLN
INTRAMUSCULAR | Status: AC
  Filled 2018-11-23: qty 2

## 2018-11-23 MED ORDER — FENTANYL CITRATE (PF) 100 MCG/2ML IJ SOLN
INTRAMUSCULAR | Status: AC
  Filled 2018-11-23: qty 2

## 2018-11-23 MED ORDER — PHYTONADIONE 5 MG PO TABS
5.0000 mg | ORAL_TABLET | Freq: Every day | ORAL | Status: AC
  Administered 2018-11-23 – 2018-11-25 (×3): 5 mg
  Filled 2018-11-23 (×3): qty 1

## 2018-11-23 MED ORDER — PRO-STAT SUGAR FREE PO LIQD
30.0000 mL | Freq: Two times a day (BID) | ORAL | Status: DC
  Filled 2018-11-23 (×2): qty 30

## 2018-11-23 MED ORDER — OSMOLITE 1.5 CAL PO LIQD
1000.0000 mL | ORAL | Status: DC
  Administered 2018-11-23: 18:00:00 1000 mL
  Filled 2018-11-23: qty 1000

## 2018-11-23 MED ORDER — PHYTONADIONE 5 MG PO TABS
5.0000 mg | ORAL_TABLET | Freq: Once | ORAL | Status: DC
  Filled 2018-11-23: qty 1

## 2018-11-23 MED ORDER — MIDAZOLAM HCL 2 MG/2ML IJ SOLN
INTRAMUSCULAR | Status: AC | PRN
  Administered 2018-11-23: 1 mg via INTRAVENOUS

## 2018-11-23 MED ORDER — PANTOPRAZOLE SODIUM 40 MG PO PACK
40.0000 mg | PACK | Freq: Every day | ORAL | Status: DC
  Administered 2018-11-23 – 2018-12-06 (×14): 40 mg
  Filled 2018-11-23 (×16): qty 20

## 2018-11-23 MED ORDER — PHYTONADIONE 1 MG/0.5 ML ORAL SOLUTION
5.0000 mg | Freq: Every day | ORAL | Status: DC
  Filled 2018-11-23 (×2): qty 2.5

## 2018-11-23 MED ORDER — MIDAZOLAM HCL 2 MG/2ML IJ SOLN
INTRAMUSCULAR | Status: AC
  Filled 2018-11-23: qty 2

## 2018-11-23 NOTE — Progress Notes (Signed)
PROGRESS NOTE    Audrey Collier  ZOX:[id number] DOB: April 08, 1960 DOA: 10/01/2018 PCP: Jani Gravel, MD    Brief Narrative: Audrey Collier is a 58 year old female with past medical history significant of cognitive delay, anxiety, hypothyroidism, and post menopausal bleeding (worked up 5 years ago), who presented on 8/1 with altered mental status from group home with fever up to 103.8 F and emesis.  Chest x-ray showed bilateral pneumonia and patient was noted to be COVID-19 negative x2.  Treated initially with Rocephin and azithromycin for suspected community-acquired pneumonia.  Blood cultures grew out MSSA and patient was switched to Ancef with rifampin added per ID.  Patient required intubation on 8/4, due to respiratory failure and care was taken over by PCCM.  On 8/6 patient underwent TEE showing large aortic vegetation.  CT surgery was consulted, and she was taken for aortic valve replacement on 10/07/2018.  Postop patient noted to have renal insufficiency and antibiotics were adjusted.  Foley catheters had to be placed due to urinary retention.  She had continued to complain of intermittent abdominal pain and nausea. Overall patient's oral intake had been minimal and therefore core track was placed with dietary consultation for tube feeds.  GI was consulted and performed EGD which did not reveal any acute cause for patient's symptoms on 9/3.  General surgery was consulted due to mildly distended gallbladder concerning for cholecystitis, but HIDA scan was negative.  Due to lack of oral intake she underwent percutaneous gastric tube placement by IR on 9/4.  Nephrology consulted after creatinine elevated to 3.48. They were concerned for likely AIN vs post strep glomerulonephritis.  On 9/7 patient developed projectile vomiting and hemoglobin dropped down to 6.8 requiring transfusion of PRBCs.  Required transfer back into the ICU for close monitoring and palliative care was consulted at that time where family made  patient DNR/DNI.  Patient completed antibiotics for endocarditis on 9/17.  Patient developed low-grade fever on 9/18 up to 100.7 F.  Urinalysis concerning for infection, but urine culture inconclusive.  Labs on 9/22 show alkaline phosphatase 535, albumin 1.8, AST 433, ALT 77, and total bilirubin 0.7.  TRH called help with overall medical management.  Assessment & Plan:   Principal Problem:   Staphylococcus aureus bacteremia with sepsis ([place]) Active Problems:   Severe sepsis ([place])   Bacteremia due to methicillin susceptible Staphylococcus aureus (MSSA)   Aortic valve endocarditis   Acute respiratory failure with hypoxia ([name])   S/P AVR (aortic valve replacement)   Pressure injury of skin   Palliative care by specialist  Severe hypoalbuminemia with anasarca: Patient still with poor appetite. Albumin levels around 1.8.  Suspect patient likely third spacing due to poor nutritional status. -Continue strict intake and output  -Continue tube feeds per dietary  Acute kidney injury: Patient with a creatinine staying around 3 with decreased overall urine output.  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  Unable to have renal biopsy on 9/22 due to nausea and vomiting.   Renal biopsy obtained on 9/23. -Follow-up renal biopsy  -Per nephrology  S/p aortic valve replacement on 8/7 for emesis a infective endocarditis. Antibiotics completed 9/17.   Elevated Liver enzymes: Acute.  Liver enzymes elevated with alkaline phosphatase 535, AST 433, and ALT 77 on 9/22.  Appear to be on a downward trend at this time.  Unclear cause at this time.  Last abdominal ultrasound from 9/1 noted mildly distended gallbladder with 15 mm gallstone and small amount of sludge.  HIDA scan  was negative for signs of acute cholecystitis at that time.   -GI consultative services appreciated, we will follow-up for further recommendation  Hypothyroidism: Last TSH noted to be 19.826 on  10/30/2018. -Recheck TSH(8.35 appears to be trending down appropriately) -Continue levothyroxine  Pancytopenia: Stable.  Hemoglobin 8.1 and platelets 127. -Continue to monitor   Cognitive delay: Stable.  Patient has been in a group home previous  DVT prophylaxis: (Lovenox/Heparin/SCD's/anticoagulated/None (if comfort care) Code Status: DNR/DNI Family Communication: No family present at bedside Disposition Plan: To be determined  Consultants:  -Nephrology -ID -General surgery -Cardiology -PCCM -IR -Palliative care  Procedures:   TEE 8/6  s/p TAVR 8/7 by Dr. Cliffton Asters  s/p PEG tube  Antimicrobials:   Subjective: Patient denies any complaints at this time.  Objective: Vitals:   11/23/18 0000 11/23/18 0417 11/23/18 0811 11/23/18 1025  BP: (!) 143/86 (!) 143/83 (!) 146/77 (!) 148/86  Pulse: 72 71 71   Resp: 20 20 19    Temp: 98.3 F (36.8 C) 97.8 F (36.6 C) 98.3 F (36.8 C)   TempSrc: Oral Oral Axillary   SpO2: 99% 98% 99%   Weight:  88.6 kg    Height:        Intake/Output Summary (Last 24 hours) at 11/23/2018 1055 Last data filed at 11/23/2018 0945 Gross per 24 hour  Intake 1250 ml  Output 1135 ml  Net 115 ml   Filed Weights   11/21/18 0500 11/22/18 0515 11/23/18 0417  Weight: 91.5 kg 90.5 kg 88.6 kg    Examination:  General exam: Appears calm and comfortable  Respiratory system: Patient on 2 L of nasal cannula oxygen with positive crackles appreciated in both lung fields. Cardiovascular system: S1 & S2 heard, RRR. No JVD, murmurs, rubs, gallops or clicks.  3+ pitting bilateral lower extremity edema Gastrointestinal system: Abdomen is nondistended, soft and nontender. No organomegaly or masses felt. Normal bowel sounds heard. Central nervous system: Alert and oriented. No focal neurological deficits. Extremities: Symmetric 5 x 5. Skin: No rashes, lesions or ulcers Psychiatry: Normal mood.     Data Reviewed: I have personally reviewed following  labs and imaging studies  CBC: Recent Labs  Lab 11/18/18 1039 11/19/18 0532 11/20/18 1433 11/22/18 0500 11/23/18 0441  WBC 14.0* 16.7* 15.3* 11.1* 9.7  HGB 8.9* 8.5* 8.3* 7.9* 8.1*  HCT 28.3* 28.2* 27.1* 26.9* 26.8*  MCV 97.9 98.3 98.9 99.3 100.0  PLT 99* 102* 122* 144* 127*   Basic Metabolic Panel: Recent Labs  Lab 11/18/18 1039 11/19/18 0532 11/20/18 1433 11/22/18 0500 11/23/18 0441  NA 142 140 143 143 143  K 4.6 4.2 4.4 4.2 4.4  CL 103 103 104 104 105  CO2 27 26 28 28 28   GLUCOSE 116* 124* 118* 135* 112*  BUN 93* 96* 95* 104* 110*  CREATININE 3.22* 3.11* 3.17* 3.20* 3.22*  CALCIUM 9.7 9.8 9.8 9.2 9.2  PHOS  --  5.5*  --   --   --    GFR: Estimated Creatinine Clearance: 19.9 mL/min (A) (by C-G formula based on SCr of 3.22 mg/dL (H)). Liver Function Tests: Recent Labs  Lab 11/19/18 0532 11/20/18 1433 11/22/18 0500 11/23/18 0441  AST  --  61* 433* 242*  ALT  --  9 77* 79*  ALKPHOS  --  334* 535* 530*  BILITOT  --  0.8 0.7 0.7  PROT  --  6.1* 5.5* 5.5*  ALBUMIN 1.8* 1.8* 1.8* 1.8*   No results for input(s): LIPASE, AMYLASE in the last  168 hours. No results for input(s): AMMONIA in the last 168 hours. Coagulation Profile: Recent Labs  Lab 11/22/18 0908  INR 1.5*   Cardiac Enzymes: No results for input(s): CKTOTAL, CKMB, CKMBINDEX, TROPONINI in the last 168 hours. BNP (last 3 results) No results for input(s): PROBNP in the last 8760 hours. HbA1C: No results for input(s): HGBA1C in the last 72 hours. CBG: Recent Labs  Lab 11/21/18 2006 11/22/18 0425 11/22/18 1259 11/22/18 2102 11/23/18 0418  GLUCAP 118* 132* 123* 136* 107*   Lipid Profile: No results for input(s): CHOL, HDL, LDLCALC, TRIG, CHOLHDL, LDLDIRECT in the last 72 hours. Thyroid Function Tests: No results for input(s): TSH, T4TOTAL, FREET4, T3FREE, THYROIDAB in the last 72 hours. Anemia Panel: No results for input(s): VITAMINB12, FOLATE, FERRITIN, TIBC, IRON, RETICCTPCT in the last  72 hours. Sepsis Labs: No results for input(s): PROCALCITON, LATICACIDVEN in the last 168 hours.  Recent Results (from the past 240 hour(s))  Culture, blood (routine x 2)     Status: None (Preliminary result)   Collection Time: 11/18/18  5:47 PM   Specimen: BLOOD  Result Value Ref Range Status   Specimen Description BLOOD RIGHT ANTECUBITAL  Final   Special Requests   Final    BOTTLES DRAWN AEROBIC AND ANAEROBIC Blood Culture adequate volume   Culture   Final    NO GROWTH 4 DAYS Performed at [facility], [address] [place], [place] [address]    Report Status PENDING  Incomplete  Culture, blood (routine x 2)     Status: None (Preliminary result)   Collection Time: 11/18/18  5:53 PM   Specimen: BLOOD RIGHT HAND  Result Value Ref Range Status   Specimen Description BLOOD RIGHT HAND  Final   Special Requests   Final    BOTTLES DRAWN AEROBIC AND ANAEROBIC Blood Culture adequate volume   Culture   Final    NO GROWTH 4 DAYS Performed at [facility], [address] [place], [place] [address]    Report Status PENDING  Incomplete  Culture, Urine     Status: Abnormal   Collection Time: 11/20/18 11:02 AM   Specimen: Urine, Catheterized  Result Value Ref Range Status   Specimen Description URINE, CATHETERIZED  Final   Special Requests NONE  Final   Culture (A)  Final    <10,000 COLONIES/mL INSIGNIFICANT GROWTH Performed at [facility], [address] [place], [place] [address]    Report Status 11/21/2018 FINAL  Final         Radiology Studies: Dg Chest 1 View  Result Date: 11/22/2018 CLINICAL DATA:  Vomiting. Shortness of breath. EXAM: CHEST  1 VIEW COMPARISON:  11/18/2018 FINDINGS: There are worsening bibasilar airspace opacities when compared to prior study. There is a persistent small right-sided pleural effusion and a trace to small left-sided pleural effusion. There is generalized volume overload. The left-sided PICC line is stable. The heart size  remains enlarged. The patient is status post prior median sternotomy. IMPRESSION: 1. Worsening bibasilar airspace opacities and small bilateral pleural effusions. Findings may be secondary to atelectasis with aspiration not excluded. 2. Stable cardiomegaly. Findings are compatible with congestive heart failure/fluid overload. Electronically Signed   By: Katherine Mantlehristopher  Green M.D.   On: 11/22/2018 11:57        Scheduled Meds:  sodium chloride   Intravenous Once   amLODipine  5 mg Oral Daily   calcium-vitamin D  1 tablet Oral Q breakfast   Chlorhexidine Gluconate Cloth  6 each Topical Daily   feeding supplement  1 Container Oral TID BM   feeding supplement (OSMOLITE 1.5 CAL)  1,000 mL Per Tube Q24H   feeding supplement (PRO-STAT SUGAR FREE 64)  30 mL Per Tube TID   FLUoxetine  10 mg Oral Daily   levothyroxine  150 mcg Oral Q0600   mouth rinse  15 mL Mouth Rinse BID   metoCLOPramide (REGLAN) injection  10 mg Intravenous Q6H   metoprolol tartrate  25 mg Oral BID   montelukast  10 mg Oral QHS   multivitamin  15 mL Oral Daily   sodium chloride flush  3 mL Intravenous Q12H   tamsulosin  0.4 mg Oral Daily   Continuous Infusions:  sodium chloride 10 mL/hr at 11/16/18 1500     LOS: 53 days    Time spent: 30 minutes    Clydie Braun, MD [facility] Pager [phone]  If 7PM-7AM, please contact night-coverage [email or web address] Password [facility] 11/23/2018, 10:55 AM

## 2018-11-23 NOTE — Progress Notes (Signed)
[facility] Progress Note    Assessment/ Plan:   1. Acute kidney injury. Remains nonoliguric. Cr in the mid 3s. 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 Serologies and complements negative. Discussed with aunt Kendal Hymen that biopsy may be warranted-->she is in agreement. UP/C 6.5, no history of DM. Sending A1c--> 4.9 on 9/19.Cr fluctuating but no trending down consistently.   - Appreciate renal bx by Dr. Jodelle Gross for a definitive diagnosis on 9/23.  - Fortunately no acute indication for dialysis; hopefully the cr is plateauing   2. 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 Completed antibiotics  3. Severe hypoalbuminemia. Continue enteral nutrition as able, getting meds for constipation -> BM this AM.  4. Cognitive delay.  5. Anemia. Hemoglobin stable for now  6. Hyperkalemia. Lokelma prn.  7. Anasarca: 40 IV Lasix 9/18 and 9/19. Alb low so expect continued third spacing and don't expect it to completely resolve.   - Appears overloaded -> will give another dose of Lasix IV today.  8. Leukocytosis/ low-grade fever: WBC ct up,and Tmax 100 yest everning.  Subjective:   APpears less dyspneic than yesterday. Vomiting yesterday with desat @ [facility] -> rescheduling the bx.    Objective:   BP (!) 159/87 (BP Location: Right Arm)   Pulse 73   Temp 98.7 F (37.1 C) (Oral)   Resp (!) 23   Ht 5\' 2"  (1.575 m)   Wt 88.6 kg   LMP 04/16/2013 (Approximate)   SpO2 97%   BMI 35.73 kg/m   Intake/Output Summary (Last 24 hours) at 11/23/2018 1353 Last data filed at 11/23/2018 1030 Gross per 24 hour  Intake 1300 ml  Output 1135 ml  Net 165 ml   Weight change: -1.9 kg  Physical Exam: General: Awake, pleasant, tachypneic HEENT: some red rash on cheeks L > R Neck: + JVD, no adenopathy CV: Heart RRR  Lungs: Rales bibasilar Abd: abd slightly distended, nontender with normal BS Extremities: +3 upper and lower extremity edema Skin:  erythematous rash on cheeks- doesn't cross nasolabial folds  Imaging: Dg Chest 1 View  Result Date: 11/22/2018 CLINICAL DATA:  Vomiting. Shortness of breath. EXAM: CHEST  1 VIEW COMPARISON:  11/18/2018 FINDINGS: There are worsening bibasilar airspace opacities when compared to prior study. There is a persistent small right-sided pleural effusion and a trace to small left-sided pleural effusion. There is generalized volume overload. The left-sided PICC line is stable. The heart size remains enlarged. The patient is status post prior median sternotomy. IMPRESSION: 1. Worsening bibasilar airspace opacities and small bilateral pleural effusions. Findings may be secondary to atelectasis with aspiration not excluded. 2. Stable cardiomegaly. Findings are compatible with congestive heart failure/fluid overload. Electronically Signed   By: 11/20/2018 M.D.   On: 11/22/2018 11:57    Labs: BMET Recent Labs  Lab 11/17/18 0447 11/18/18 1039 11/19/18 0532 11/20/18 1433 11/22/18 0500 11/23/18 0441  NA 143 142 140 143 143 143  K 4.7 4.6 4.2 4.4 4.2 4.4  CL 106 103 103 104 104 105  CO2 27 27 26 28 28 28   GLUCOSE 124* 116* 124* 118* 135* 112*  BUN 97* 93* 96* 95* 104* 110*  CREATININE 3.30* 3.22* 3.11* 3.17* 3.20* 3.22*  CALCIUM 9.4 9.7 9.8 9.8 9.2 9.2  PHOS  --   --  5.5*  --   --   --    CBC Recent Labs  Lab 11/19/18 0532 11/20/18 1433 11/22/18 0500 11/23/18 0441  WBC 16.7* 15.3* 11.1* 9.7  HGB 8.5* 8.3* 7.9* 8.1*  HCT 28.2* 27.1* 26.9* 26.8*  MCV 98.3 98.9 99.3 100.0  PLT 102* 122* 144* 127*    Medications:    . sodium chloride   Intravenous Once  . amLODipine  5 mg Oral Daily  . calcium-vitamin D  1 tablet Oral Q breakfast  . Chlorhexidine Gluconate Cloth  6 each Topical Daily  . feeding supplement  1 Container Oral TID BM  . feeding supplement (OSMOLITE 1.5 CAL)  1,000 mL Per Tube Q24H  . feeding supplement (PRO-STAT SUGAR FREE 64)  30 mL Per Tube TID  . fentaNYL      .  FLUoxetine  10 mg Oral Daily  . levothyroxine  150 mcg Oral Q0600  . lidocaine (PF)      . mouth rinse  15 mL Mouth Rinse BID  . metoCLOPramide (REGLAN) injection  10 mg Intravenous Q6H  . metoprolol tartrate  25 mg Oral BID  . midazolam      . montelukast  10 mg Oral QHS  . multivitamin  15 mL Oral Daily  . sodium chloride flush  3 mL Intravenous Q12H  . tamsulosin  0.4 mg Oral Daily      Otelia Santee, MD 11/23/2018, 1:53 PM

## 2018-11-23 NOTE — Progress Notes (Addendum)
      [address]       [place], [address]             [phone]      20 Days Post-Op Procedure(s) (LRB): ESOPHAGOGASTRODUODENOSCOPY (EGD) WITH PROPOFOL (N/A) BIOPSY   Subjective:  No new complaints.  Continues to complain of nausea, and belly pain.  Unable to undergo IR renal biopsy yesterday as she vomited on arrival to the IR suite.    Objective: Vital signs in last 24 hours: Temp:  [97.8 F (36.6 C)-99 F (37.2 C)] 97.8 F (36.6 C) (09/23 0417) Pulse Rate:  [71-87] 71 (09/23 0417) Cardiac Rhythm: Normal sinus rhythm (09/22 2000) Resp:  [20-29] 20 (09/23 0417) BP: (135-153)/(79-87) 143/83 (09/23 0417) SpO2:  [95 %-99 %] 98 % (09/23 0417) Weight:  [88.6 kg] 88.6 kg (09/23 0417)  Intake/Output from previous day: 09/22 0701 - 09/23 0700 In: 1550 [P.O.:400; I.V.:40; NG/GT:560] Out: 485 [Urine:485]  General appearance: alert, cooperative and no distress Heart: regular rate and rhythm Lungs: diminished breath sounds bibasilar Abdomen: soft, non-tender; bowel sounds normal; no masses,  no organomegaly Extremities: edema trace Wound: clean and dry  Lab Results: Recent Labs    11/22/18 0500 11/23/18 0441  WBC 11.1* 9.7  HGB 7.9* 8.1*  HCT 26.9* 26.8*  PLT 144* 127*   BMET:  Recent Labs    11/22/18 0500 11/23/18 0441  NA 143 143  K 4.2 4.4  CL 104 105  CO2 28 28  GLUCOSE 135* 112*  BUN 104* 110*  CREATININE 3.20* 3.22*  CALCIUM 9.2 9.2    PT/INR:  Recent Labs    11/22/18 0908  LABPROT 18.2*  INR 1.5*   ABG    Component Value Date/Time   PHART 7.430 10/08/2018 0920   HCO3 23.6 10/08/2018 0920   TCO2 25 10/08/2018 0920   ACIDBASEDEF 6.0 (H) 10/08/2018 0307   O2SAT 94.0 10/08/2018 0920   CBG (last 3)  Recent Labs    11/22/18 1259 11/22/18 2102 11/23/18 0418  GLUCAP 123* 136* 107*    Assessment/Plan: S/P Procedure(s) (LRB): ESOPHAGOGASTRODUODENOSCOPY (EGD) WITH PROPOFOL (N/A) BIOPSY  1. CV- hemodynamically stable  on Norvasc, Lopressor 2. Pulm- no acute issues 3. Renal- AIN, creatinine remains stable, Nephrology managing, possible Renal biopsy 4. GI- now with elevated LFTs, continued vomiting, little to no oral intake, on tube feeds 5. Deconditioning- persists, placement remains an issue 6. Dispo- patient is stable for AVR standpoint, will consult medicine for assistance in managing medical problems   LOS: 5 days    Ellwood Handler 11/23/2018  Agree with above Likely hepato-renal syndrome, statin discontinued Renal on board, will consult GI, and Medicine for management No surgical issues at this point.  Incisions healed, and abx course completed  Marleena Shubert O Lakishia Bourassa

## 2018-11-23 NOTE — Progress Notes (Signed)
OT Cancellation Note  Patient Details Name: Audrey Collier MRN: [id number] DOB: 1960/12/19   Cancelled Treatment:    Reason Eval/Treat Not Completed: Patient at procedure or test/ unavailable Per RN, pt at procedure and will be on best rest for 1hour when she returns. OT will continue to follow and re-attempt when pt is appropriate and as time allows.   Dorinda Hill OTR/L Acute Rehabilitation Services Office: [place] 11/23/2018, 1:08 PM

## 2018-11-23 NOTE — Procedures (Signed)
Pre Procedure Dx: Proteinuria Post Procedural Dx: Same  Technically successful US guided biopsy of inferior pole of the left kidney.  EBL: None  No immediate complications.   Jay Brooks Stotz, MD Pager #: [phone]   

## 2018-11-23 NOTE — Progress Notes (Addendum)
Daily Rounding Note  11/23/2018, 1:34 PM  LOS: 53 days   SUBJECTIVE:   Chief complaint:   This is a reconsult on the patient initially seen 8/31. Reason for current GI consult is elevated LFTs.  Patient has cognitive delay.  Known to Dr. Jonette Eva for IBS/constipation.  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  Underwent AVR 10/07/2018.  Intraoperative/post AVR  echocardiogram with LVEF 60 to 65%, normal right ventricular systole. Host of problems including non-oliguric AKI, ? 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  Kidney biopsy 9/23.  Thrombocytopenia, anemia.  Elevated LFTs attributed to sepsis/hypotension.    Underwent 11/03/18 EGD for left abdominal pain, anorexia, malnutrition.  Findings: normal esophagus, antral erythema, small hiatal hernia, normal duodenum.  Gastric biopsies showed reactive gastropathy with focal intestinal metaplasia, no dysplasia.  Hyperplastic polyp.  No H. Pylori.  No reason for her loss of appetite or left abd pain found. 11/01/2018 HIDA scan: Prompt uptake and excretion of contrast by liver, GB not visualized after 1 hour but visualized 30 minutes following administration of morphine.  Contrast passed into small bowel consistent with patent CBD. IR placed gastrostomy feeding tube 11/04/2018. 11/07/2018 CTAP w/o contrast for abdominal pain and post PERC G-tube bleeding.  Liver was normal.  Trace perihepatic and small intra-abdominal ascites.  Normal-looking pancreas, spleen, gallbladder.  Upper abdominal pneumoperitoneum as expected following G-tube placement.  Large burden of stool in the rectum but no evidence for colonic obstruction.  Diffuse body wall anasarca. 11/07/2018 abdominal ultrasound:   Slight liver nodularity in keeping with early cirrhosis, cholelithiasis, small perihepatic ascites, right pleural effusion.  Deconditioned, malnourished.   Receiving nocturnal tube feeds start 2000, and 0600. Additionally she has resource bruits ordered 3 times daily between meals 2 PRBCs for normocytic anemia two weeks ago, 1 of platelets 6 weeks ago. Thrombocytopenia, improving.   Alkaline phosphatase is rising.  Transaminases elevated, fluctuating transaminases, consistently normal T bili.   Hypoalbuminemia consistent with malnutrition. PT/INR mildly elevated.   Last Abx (rifampin) finished 8/14, Crestor discontinued today, last dose yesterday.  Intermittent, non-bloody emesis every few/several days.  Yesterday, she vomited while laying on her stomach for kidney biopsy which was then canceled.  Underwent successful kidney biopsy today with no vomiting.  Reglan 10 mg IV every 6 hours initiated 3 days ago.  Zofran as needed.   Patient denies abdominal pain, nausea.  If I/O charting is reliable in terms of BMs, she had gone 1 week without BMs before yesterday's 2 recorded BM's.  No BM today.  Receiving Chronulac as needed, last dose was 4 days ago.   Weight dropping.  2 weeks ago she weighed 97.6 kg, yesterday 88.6 kg.    No Protonix since 9/7  OBJECTIVE:         Vital signs in last 24 hours:    Temp:  [97.8 F (36.6 C)-99 F (37.2 C)] 98.3 F (36.8 C) (09/23 0811) Pulse Rate:  [71-81] 72 (09/23 1315) Resp:  [19-25] 22 (09/23 1315) BP: (135-164)/(77-124) 156/84 (09/23 1315) SpO2:  [92 %-100 %] 100 % (09/23 1315) Weight:  [88.6 kg] 88.6 kg (09/23 0417) Last BM Date: 11/22/18 Filed Weights   11/21/18 0500 11/22/18 0515 11/23/18 0417  Weight: 91.5 kg 90.5 kg 88.6 kg   General: pale, somewhat ill but not toxic looking.  Somnolent, speaks little.     Heart: RRR Chest: clear bil decreased respiratory effort Abdomen: Soft mild distention, not tympanitic.  BS hypoactive.  No hepatosplenomegaly or mass appreciated, though somewhat limited by body habitus.  PEG in upper midline..    Extremities: anasarca to upper abdominal wall, 1 plus pitting,  > on right LE.  Non-pitting swelling in both arms Neuro/Psych: Does not participate in a full neuro exam due to her somnolence.  No tremor or asterixis.     Intake/Output from previous day: 09/22 0701 - 09/23 0700 In: 1550 [P.O.:400; I.V.:40; NG/GT:560] Out: 485 [Urine:485]  Intake/Output this shift: Total I/O In: 50 [Other:50] Out: 650 [Urine:650]  Lab Results: Recent Labs    11/20/18 1433 11/22/18 0500 11/23/18 0441  WBC 15.3* 11.1* 9.7  HGB 8.3* 7.9* 8.1*  HCT 27.1* 26.9* 26.8*  PLT 122* 144* 127*   BMET Recent Labs    11/20/18 1433 11/22/18 0500 11/23/18 0441  NA 143 143 143  K 4.4 4.2 4.4  CL 104 104 105  CO2 28 28 28   GLUCOSE 118* 135* 112*  BUN 95* 104* 110*  CREATININE 3.17* 3.20* 3.22*  CALCIUM 9.8 9.2 9.2   LFT Recent Labs    11/20/18 1433 11/22/18 0500 11/23/18 0441  PROT 6.1* 5.5* 5.5*  ALBUMIN 1.8* 1.8* 1.8*  AST 61* 433* 242*  ALT 9 77* 79*  ALKPHOS 334* 535* 530*  BILITOT 0.8 0.7 0.7   PT/INR Recent Labs    11/22/18 0908  LABPROT 18.2*  INR 1.5*   Hepatitis Panel No results for input(s): HEPBSAG, HCVAB, HEPAIGM, HEPBIGM in the last 72 hours.  Studies/Results: Dg Chest 1 View  Result Date: 11/22/2018 CLINICAL DATA:  Vomiting. Shortness of breath. EXAM: CHEST  1 VIEW COMPARISON:  11/18/2018 FINDINGS: There are worsening bibasilar airspace opacities when compared to prior study. There is a persistent small right-sided pleural effusion and a trace to small left-sided pleural effusion. There is generalized volume overload. The left-sided PICC line is stable. The heart size remains enlarged. The patient is status post prior median sternotomy. IMPRESSION: 1. Worsening bibasilar airspace opacities and small bilateral pleural effusions. Findings may be secondary to atelectasis with aspiration not excluded. 2. Stable cardiomegaly. Findings are compatible with congestive heart failure/fluid overload. Electronically Signed   By: 11/20/2018  M.D.   On: 11/22/2018 11:57    ASSESMENT:   *   Elevated LFTs with cholestatic pattern.  Intermittent left abd pain, nausea, vomiting.   No significant source for sxs, mild gastritis, on EGD  Gallstones and GB emptying delay but patent CBD on HIDA.  "early" cirrhosis on 11/24/2018, liver normal on non-con CT.   ? Is this due to passive liver congestion in setting of CHF as noted on yesterday's CXR (despite good left and right heart EF on intra-op echo).   ? DILI, last abx completed 8/14 (Rifamipin), Crestor stopped yesterday.   ? Decompensated cirrhosis, though the cirrhosis is mild/early per ultrasound  ? Viral hepatitis (low suspicion).   Source of nausea/vomiting may be gastroparesis.   *    IBS-C.    *   Slight increase PT/INR.  ? Due to liver dysfunction vs malnutrition/Vitamin K deficiency?  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  *    Malnutrition with anasarca.   11/04/2018 gastrostomy feeding tube placement.    *     AKI.   Underwent renal biopsy today, pathology pending.  *   Normocytic anemia.  *     Hypothyroidism.  TSH elevated at 19 with normal T4 8/30.   PLAN   *  Restart Protonix via tube.  Stop prn lactulose, start Miralax 17 gm via tube q MWF.    *     Restart tube feeds. Osmolite 1.5 at 45/hour between 1800 and 0800, Prostat 30 ml TID as outlined by RD in 9/21 note.  However, RD said Osmolite rate of 75/hour, will trial 45/hour for now.     *   TSH repeat is pending.   Add hepatitis serologies and prealbumin to AM labs.   *   Vitamin K via tube for 3 days, then recheck PT/INR 9/26.           Azucena Freed  11/23/2018, 1:34 PM Phone [phone]  I have discussed the case with the PA, and that is the plan I formulated. I personally interviewed and examined the patient.  Complex case, has been weeks since we last saw this patient, extensive chart review performed.  Elevated LFTs in a mixed hepatocellular and cholestatic pattern with elevated  alkaline phosphatase and normal bilirubin, most consistent with drug-induced liver injury in this patient.  Exact medicine or combination of medicines that may have been the culprit is difficult to know for certain, but I am most suspicious of rifampin, the course of which is now complete.  Imaging suggest she may have underlying liver cirrhosis.  If so, such patients are most likely more prone to DILI and more likely to have a prolonged period of time before improvement in LFTs.  There is no early area obstruction on imaging.  I agree that the modestly elevated INR is likely nutritional, and we have ordered vitamin K for 3 days.  The episode of vomiting yesterday appears to have been related to her prone position for renal biopsy.  She has otherwise reportedly been tolerating tube feeds well.  Nursing has been asked to resume nocturnal tube feeds per the nutrition protocol outlined in their September 21 note.  The liver labs can be checked every few days.  Transaminases likely to improve sooner than the alkaline phosphatase.  Nothing further to add at this point, conservative management with observation recommended.  We will sign off, feel free to reconsult as needed.  Total time 35 minutes.  Nelida Meuse III Office: [phone]

## 2018-11-24 DIAGNOSIS — N189 Chronic kidney disease, unspecified: Secondary | ICD-10-CM

## 2018-11-24 LAB — CBC
HCT: 27.2 % — ABNORMAL LOW (ref 36.0–46.0)
Hemoglobin: 8.1 g/dL — ABNORMAL LOW (ref 12.0–15.0)
MCH: 29.6 pg (ref 26.0–34.0)
MCHC: 29.8 g/dL — ABNORMAL LOW (ref 30.0–36.0)
MCV: 99.3 fL (ref 80.0–100.0)
Platelets: 126 10*3/uL — ABNORMAL LOW (ref 150–400)
RBC: 2.74 MIL/uL — ABNORMAL LOW (ref 3.87–5.11)
RDW: 15.6 % — ABNORMAL HIGH (ref 11.5–15.5)
WBC: 10.7 10*3/uL — ABNORMAL HIGH (ref 4.0–10.5)
nRBC: 0.2 % (ref 0.0–0.2)

## 2018-11-24 LAB — GLUCOSE, CAPILLARY
Glucose-Capillary: 121 mg/dL — ABNORMAL HIGH (ref 70–99)
Glucose-Capillary: 156 mg/dL — ABNORMAL HIGH (ref 70–99)
Glucose-Capillary: 154 mg/dL — ABNORMAL HIGH (ref 70–99)
Glucose-Capillary: 123 mg/dL — ABNORMAL HIGH (ref 70–99)

## 2018-11-24 LAB — PREALBUMIN: Prealbumin: 18.9 mg/dL (ref 18–38)

## 2018-11-24 LAB — COMPREHENSIVE METABOLIC PANEL
ALT: 79 U/L — ABNORMAL HIGH (ref 0–44)
AST: 194 U/L — ABNORMAL HIGH (ref 15–41)
Albumin: 1.8 g/dL — ABNORMAL LOW (ref 3.5–5.0)
Alkaline Phosphatase: 520 U/L — ABNORMAL HIGH (ref 38–126)
Anion gap: 10 (ref 5–15)
BUN: 109 mg/dL — ABNORMAL HIGH (ref 6–20)
CO2: 30 mmol/L (ref 22–32)
Calcium: 9.1 mg/dL (ref 8.9–10.3)
Chloride: 104 mmol/L (ref 98–111)
Creatinine, Ser: 3.16 mg/dL — ABNORMAL HIGH (ref 0.44–1.00)
GFR calc Af Amer: 18 mL/min — ABNORMAL LOW (ref >60)
GFR calc non Af Amer: 16 mL/min — ABNORMAL LOW (ref >60)
Glucose, Bld: 125 mg/dL — ABNORMAL HIGH (ref 70–99)
Potassium: 4.4 mmol/L (ref 3.5–5.1)
Sodium: 144 mmol/L (ref 135–145)
Total Bilirubin: 0.9 mg/dL (ref 0.3–1.2)
Total Protein: 5.6 g/dL — ABNORMAL LOW (ref 6.5–8.1)

## 2018-11-24 LAB — T4, FREE: Free T4: 0.92 ng/dL (ref 0.61–1.12)

## 2018-11-24 MED ORDER — OSMOLITE 1.5 CAL PO LIQD
1000.0000 mL | ORAL | Status: DC
  Administered 2018-11-24 – 2018-11-29 (×6): 1000 mL
  Filled 2018-11-24 (×9): qty 1000

## 2018-11-24 MED ORDER — PRO-STAT SUGAR FREE PO LIQD
30.0000 mL | Freq: Three times a day (TID) | ORAL | Status: DC
  Administered 2018-11-24 – 2018-12-06 (×35): 30 mL
  Filled 2018-11-24 (×36): qty 30

## 2018-11-24 MED ORDER — ADULT MULTIVITAMIN W/MINERALS CH
1.0000 | ORAL_TABLET | Freq: Every day | ORAL | Status: DC
  Administered 2018-11-24 – 2018-12-06 (×12): 1 via ORAL
  Filled 2018-11-24 (×12): qty 1

## 2018-11-24 NOTE — Plan of Care (Signed)
Cross-cover note.  Spoke with [facility] path re: renal biopsy.    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  Severe interstitial fibrosis and tubular atrophy and minor minor proliferative activity.  Some acute component but looks more indolent.  Mainly membranous phenotype.  2-3+ C1q.  Ddx may be lupus membranous nephritis or related to endocarditis.   Repeated findings back to path. No need for immunomodulating therapy at this point per path per their findings.    They recommended serologies which have been checked - Note ANA neg, ANCAneg.  Most recent blood cultures negative.    Will discuss with Dr. Augustin Coupe in AM   Harrie Jeans, MD

## 2018-11-24 NOTE — Progress Notes (Signed)
      [address]       [place] [address]             [phone]      21 Days Post-Op Procedure(s) (LRB): ESOPHAGOGASTRODUODENOSCOPY (EGD) WITH PROPOFOL (N/A) BIOPSY   Subjective:  Patient doing okay this morning.  States she ate half of a banana, admits to be nauseated.  Objective: Vital signs in last 24 hours: Temp:  [97.6 F (36.4 C)-99 F (37.2 C)] 99 F (37.2 C) (09/24 0800) Pulse Rate:  [69-78] 76 (09/24 0800) Cardiac Rhythm: Normal sinus rhythm (09/24 0700) Resp:  [18-26] 18 (09/24 0800) BP: (144-164)/(80-124) 159/91 (09/24 0800) SpO2:  [92 %-100 %] 97 % (09/24 0800) Weight:  [88.3 kg] 88.3 kg (09/24 0333)  Intake/Output from previous day: 09/23 0701 - 09/24 0700 In: 961.8 [P.O.:500; NG/GT:411.8] Out: 651 [Urine:650; Emesis/NG output:1] Intake/Output this shift: Total I/O In: 162 [NG/GT:162] Out: 500 [Urine:500]  General appearance: alert, cooperative and no distress Heart: regular rate and rhythm Lungs: clear to auscultation bilaterally Abdomen: soft, non-tender; bowel sounds normal; no masses,  no organomegaly Wound: clean and dry around PEG tube, sternotomy well healed  Lab Results: Recent Labs    11/23/18 0441 11/24/18 0423  WBC 9.7 10.7*  HGB 8.1* 8.1*  HCT 26.8* 27.2*  PLT 127* 126*   BMET:  Recent Labs    11/23/18 0441 11/24/18 0423  NA 143 144  K 4.4 4.4  CL 105 104  CO2 28 30  GLUCOSE 112* 125*  BUN 110* 109*  CREATININE 3.22* 3.16*  CALCIUM 9.2 9.1    PT/INR:  Recent Labs    11/22/18 0908  LABPROT 18.2*  INR 1.5*   ABG    Component Value Date/Time   PHART 7.430 10/08/2018 0920   HCO3 23.6 10/08/2018 0920   TCO2 25 10/08/2018 0920   ACIDBASEDEF 6.0 (H) 10/08/2018 0307   O2SAT 94.0 10/08/2018 0920   CBG (last 3)  Recent Labs    11/23/18 1403 11/23/18 2018 11/24/18 0323  GLUCAP 65* 97 121*    Assessment/Plan: S/P Procedure(s) (LRB): ESOPHAGOGASTRODUODENOSCOPY (EGD) WITH PROPOFOL (N/A)  BIOPSY  1. CV- remains hemodynamically stable on Lopressor, Norvasc 2. Pulm- CXR shows stable bilateral effusions, atelectasis 3. Renal- creatinine down to 3.16 today, renal biopsy performed yesterday, Nephrology 4. GI- elevated LFTs, persistent vomiting with oral intake... appreciate Medicine/GI assistance.. oral intake remains poor, patient is willing to continue to attempt, tube feedings per nutrition recommendations 5. Deconditioning- continue PT/OT as patient is able 6. Dispo- patient is stable, appreciated GI/Internal Medicine, Nephrology assistance, renal biopsy pending, continue to work up source of vomiting, continue current care   LOS: 54 days    Ellwood Handler 11/24/2018

## 2018-11-24 NOTE — Progress Notes (Signed)
PALLIATIVE NOTE:  RN at the bedside providing perineal care to patient s/p bowel movement. [name] (aunt) at the bedside requesting updates on patient care and plans regarding patient's intermittent episodes of nausea. Updates provided and reviewed GI recommendations. [name] verbalized understanding and appreciation.   Patient is awake, alert, and oriented to self and family. She is pleasant and smiling. Denies pain or nausea at this time. Does not appear in any acute distress.   Family remains hopeful for further answers once biopsy results return and also hopeful Ms. Avena will continue to show signs of improvement. [name] states she is hopeful she will get out of the hospital soon and can go to a SNF. [name] asking if she can go to the [facility]. Explained that the CSW is working to find placement however, there is not a definitive option at this time. She is aware family will be updated appropriately by team. [name] verbalized understanding and appreciation of care being given.   All questions answered.   Assessment -awake, pleasant, obese -RRR - diminished bilaterally  -abdomen soft, non-tender, +bowel sounds x4, PEG tube -awake, alert to self & family, pleasant   Plan -DNR/DNI -Continue current plan of care -Family remains hopeful for improvement and SNF placement with [facility] -PMT will continue to support and follow as needed. Please call if needed.  Total Time: 35 min.   Greater than 50%  of this time was spent counseling and coordinating care related to the above assessment and plan.  Alda Lea, AGPCNP-BC Palliative Medicine Team

## 2018-11-24 NOTE — Progress Notes (Addendum)
Nutrition Follow-up  DOCUMENTATION CODES:   Not applicable  INTERVENTION:   -D/C liquid MVI -Order tablet MVI -D/C Boost Breeze   Change tube feeding:  -Osmolite 1.5 @ 45 ml/hr x 24 hours -30 ml Prostat TID   Provides: 1920 kcals, 113 grams protein, 823 ml free water. Meets 100% of needs.   NUTRITION DIAGNOSIS:   Inadequate oral intake related to poor appetite as evidenced by meal completion < 50%.  Ongoing  GOAL:   Patient will meet greater than or equal to 90% of their needs  Addressed via TF  MONITOR:   PO intake, Supplement acceptance, Diet advancement, TF tolerance, Weight trends, Labs, I & O's, Skin  REASON FOR ASSESSMENT:   Consult Enteral/tube feeding initiation and management(nocturnal)  ASSESSMENT:   58 yo female admitted with fever, vomiting, weakness. Found to have MSSA bacteremia with PNA. Echo showed vegetation. Required transfer to the ICU and intubation on 8/4. PMH includes developmental delay, GERD, anxiety, hypothyroidism, HLD.   8/6- TEE showing large aortic valve vegetation 8/7- s/p aortic valve replacement 8/8- extubated, diet advanced to Dysphagia 2 8/21- Cortrak placement(tip in stomach); spoke with TCTS, ok to start TF 9/1- HIDA negative 9/3- Cortrak pulled during EGD 9/4- s/p PEG placement 9/7- PEG to gravity, CT abdomen negative 9/8- clear liquids, multiple episodes vomiting 9/23- diet advanced to regular   Spoke with RN. Last night pt consumed Boost Breeze and projectile vomited immediatly after. Ate a banana this morning and complained of nausea. Pt refused to take Prostat PO, instructed RN to provide by tube moving forward as ordered. Osmoilte 1.5 running at 45 ml/hr during RD visit. RD to change tube feeding to run over 24 hr period to lower volume per hour. RN made aware.   Admission weight: 77.7 kg Current weight: 88.3 kg   Generalized +3 edema continues.   I/O: +3,647 ml since 9/7 UOP: 650 ml x 24 hrs   Medications:  oscal with D, reglan, MVI, Vit K, miralax Labs: Cr 3.16- trend down LFTs elevated CBG 65-121   Diet Order:   Diet Order            Diet regular Room service appropriate? Yes; Fluid consistency: Thin  Diet effective now              EDUCATION NEEDS:   No education needs have been identified at this time  Skin:  Skin Assessment: Skin Integrity Issues: Skin Integrity Issues:: Stage II, Incisions DTI: R ear Stage II: R buttocks Incisions: chest Other: MASD-arm, buttocks, labia  Last BM:  9/22  Height:   Ht Readings from Last 1 Encounters:  10/07/18 5\' 2"  (1.575 m)    Weight:   Wt Readings from Last 1 Encounters:  11/24/18 88.3 kg    Ideal Body Weight:  56.8 kg  BMI:  Body mass index is 35.6 kg/m.  Estimated Nutritional Needs:   Kcal:  1800-2000  Protein:  100-115 gm  Fluid:  >/= 1.8 L   Mariana Single RD, LDN Clinical Nutrition Pager # [phone]

## 2018-11-24 NOTE — Progress Notes (Signed)
Pt had projectile vomiting with undigested food after she drank a whole carton of Boost supplement. Metoclopramide given per schedule. Abdominal distend, denied pain, active bowel sound, regular and un-labour breathing  with 2 LPM of O2 NCL, SPO2 97%, sinus rhythm on the monitor, HR 60s-70s.BP stable, remains afebrile. Will continue to monitor.  Kennyth Lose, RN

## 2018-11-24 NOTE — Plan of Care (Signed)
Continue to monitor

## 2018-11-24 NOTE — Progress Notes (Signed)
[facility] Progress Note    Assessment/ Plan:   1. Acute kidney injury. Remains nonoliguric. Cr in the mid 3s. 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 Serologies and complements negative. Discussed with aunt Audrey Collier that biopsy may be warranted-->she is in agreement. UP/C 6.5, no history of DM. Sending A1c-->4.9 on 9/19.Crfluctuating but notrendingdown consistently.   - Appreciate renalbx by Dr. Chauncey Fischer for a definitive diagnosis on 9/23 -> confirmed today that [facility] has received the sample. - Fortunately no acute indication for dialysis; hopefully the cr is plateauing. Renal function appears to be stabilizing.  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   2. 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 Completed antibiotics  3. Severe hypoalbuminemia. Continue enteral nutrition as able, getting meds for constipation-> BM this AM.  4. Cognitive delay.  5. Anemia. Hemoglobin stable for now  6. Hyperkalemia. Lokelma prn.  7. Anasarca: 40 IV Lasix 9/18 and 9/19. Alb low so expect continued third spacing and don't expect it to completely resolve.  - Appears overloaded -> will give another dose of Lasix IV today.  8. Leukocytosis/ low-grade fever: WBC ct up,and Tmax 100yest everning.  Subjective:   Doesn't appear dyspneic. TOlerated the bx yest.   Objective:   BP (!) 154/86   Pulse 78   Temp 99 F (37.2 C) (Oral)   Resp 18   Ht 5\' 2"  (1.575 m)   Wt 88.3 kg   LMP 04/16/2013 (Approximate)   SpO2 97%   BMI 35.60 kg/m   Intake/Output Summary (Last 24 hours) at 11/24/2018 1246 Last data filed at 11/24/2018 0900 Gross per 24 hour  Intake 1073.75 ml  Output 501 ml  Net 572.75 ml   Weight change: -0.3 kg  Physical Exam: General:Awake, pleasant, tachypneic HEENT: some red rash on cheeks L > R Neck: + JVD, no adenopathy CV: Heart RRR   Lungs:Rales bibasilar Abd: abd slightly distended, nontender with normal BS Extremities: +3 upper and lower extremity edema Skin: erythematous rash on cheeks- doesn't cross nasolabial folds  Imaging: US Biopsy (kidney)  Result Date: 11/23/2018 INDICATION: Acute renal insufficiency. Please perform ultrasound-guided renal biopsy for tissue diagnostic purposes. EXAM: ULTRASOUND GUIDED RENAL BIOPSY COMPARISON:  CT abdomen and pelvis - 10/31/2018 MEDICATIONS: None. ANESTHESIA/SEDATION: Fentanyl 25 mcg IV; Versed 1 mg IV Total Moderate Sedation time: 10 minutes; The patient was continuously monitored during the procedure by the interventional radiology nurse under my direct supervision. COMPLICATIONS: None immediate. PROCEDURE: Informed written consent was obtained from the patient's aunt after a discussion of the risks, benefits and alternatives to treatment. The patient understands and consents the procedure. A timeout was performed prior to the initiation of the procedure. Ultrasound scanning was performed of the bilateral flanks. The inferior pole of the left kidney was selected for biopsy due to location and sonographic window. The procedure was planned. The operative site was prepped and draped in the usual sterile fashion. The overlying soft tissues were anesthetized with 1% lidocaine with epinephrine. A 17 gauge core needle biopsy device was advanced into the inferior cortex of the left kidney and 2 core biopsies were obtained under direct ultrasound guidance. Images were saved for documentation purposes. The biopsy device was removed and hemostasis was obtained with manual compression. Post procedural scanning was negative for significant post procedural hemorrhage or additional complication. A dressing was placed. The patient tolerated the procedure well without immediate post procedural complication. IMPRESSION:  Technically successful ultrasound guided left renal biopsy. Electronically Signed   By:  Simonne Come M.D.   On: 11/23/2018 15:06    Labs: BMET Recent Labs  Lab 11/18/18 1039 11/19/18 0532 11/20/18 1433 11/22/18 0500 11/23/18 0441 11/24/18 0423  NA 142 140 143 143 143 144  K 4.6 4.2 4.4 4.2 4.4 4.4  CL 103 103 104 104 105 104  CO2 27 26 28 28 28 30   GLUCOSE 116* 124* 118* 135* 112* 125*  BUN 93* 96* 95* 104* 110* 109*  CREATININE 3.22* 3.11* 3.17* 3.20* 3.22* 3.16*  CALCIUM 9.7 9.8 9.8 9.2 9.2 9.1  PHOS  --  5.5*  --   --   --   --    CBC Recent Labs  Lab 11/20/18 1433 11/22/18 0500 11/23/18 0441 11/24/18 0423  WBC 15.3* 11.1* 9.7 10.7*  HGB 8.3* 7.9* 8.1* 8.1*  HCT 27.1* 26.9* 26.8* 27.2*  MCV 98.9 99.3 100.0 99.3  PLT 122* 144* 127* 126*    Medications:    . sodium chloride   Intravenous Once  . amLODipine  5 mg Oral Daily  . calcium-vitamin D  1 tablet Oral Q breakfast  . Chlorhexidine Gluconate Cloth  6 each Topical Daily  . feeding supplement (OSMOLITE 1.5 CAL)  1,000 mL Per Tube Q24H  . feeding supplement (PRO-STAT SUGAR FREE 64)  30 mL Per Tube TID  . FLUoxetine  10 mg Oral Daily  . levothyroxine  150 mcg Oral Q0600  . mouth rinse  15 mL Mouth Rinse BID  . metoCLOPramide (REGLAN) injection  10 mg Intravenous Q6H  . metoprolol tartrate  25 mg Oral BID  . montelukast  10 mg Oral QHS  . multivitamin with minerals  1 tablet Oral Daily  . pantoprazole sodium  40 mg Per Tube Daily  . phytonadione  5 mg Per Tube Daily  . polyethylene glycol  17 g Per Tube Q M,W,F  . sodium chloride flush  3 mL Intravenous Q12H  . tamsulosin  0.4 mg Oral Daily      11/26/18, MD 11/24/2018, 12:46 PM

## 2018-11-24 NOTE — Progress Notes (Signed)
PROGRESS NOTE    Audrey Collier  TMH:[id number] DOB: [birth year] DOA: 10/01/2018 PCP: Jani Gravel, MD   Brief Narrative:  Audrey Collier is Audrey Collier 58 year old female with past medical history significant of cognitive delay, anxiety, hypothyroidism, and post menopausal bleeding (worked up 5 years ago), who presented on 8/1 with altered mental status from group home with fever up to 103.8 F and emesis.  Chest x-ray showed bilateral pneumonia and patient was noted to be COVID-19 negative x2.  Treated initially with Rocephin and azithromycin for suspected community-acquired pneumonia.  Blood cultures grew out MSSA and patient was switched to Ancef with rifampin added per ID.  Patient required intubation on 8/4, due to respiratory failure and care was taken over by PCCM.  On 8/6 patient underwent TEE showing large aortic vegetation.  CT surgery was consulted, and she was taken for aortic valve replacement on 10/07/2018.  Postop patient noted to have renal insufficiency and antibiotics were adjusted.  Foley catheters had to be placed due to urinary retention.  She had continued to complain of intermittent abdominal pain and nausea. Overall patient's oral intake had been minimal and therefore core track was placed with dietary consultation for tube feeds.  GI was consulted and performed EGD which did not reveal any acute cause for patient's symptoms on 9/3.  General surgery was consulted due to mildly distended gallbladder concerning for cholecystitis, but HIDA scan was negative.  Due to lack of oral intake she underwent percutaneous gastric tube placement by IR on 9/4.  Nephrology consulted after creatinine elevated to 3.48. They were concerned for likely AIN vs post strep glomerulonephritis.  On 9/7 patient developed projectile vomiting and hemoglobin dropped down to 6.8 requiring transfusion of PRBCs.  Required transfer back into the ICU for close monitoring and palliative care was consulted at that time where family made  patient DNR/DNI.  Patient completed antibiotics for endocarditis on 9/17.  Patient developed low-grade fever on 9/18 up to 100.7 F.  Urinalysis concerning for infection, but urine culture inconclusive.  Labs on 9/22 show alkaline phosphatase 535, albumin 1.8, AST 433, ALT 77, and total bilirubin 0.7.  TRH called help with overall medical management.  Assessment & Plan:   Principal Problem:   Staphylococcus aureus bacteremia with sepsis ([place]) Active Problems:   Severe sepsis ([place])   Bacteremia due to methicillin susceptible Staphylococcus aureus (MSSA)   Aortic valve endocarditis   Acute respiratory failure with hypoxia ([name])   S/P AVR (aortic valve replacement)   Pressure injury of skin   Palliative care by specialist  Severe hypoalbuminemia with anasarca: Patient still with poor appetite. Albumin levels around 1.8.  Suspect patient likely third spacing due to poor nutritional status. -Continue strict intake and output  -Continue tube feeds per dietary  Acute kidney injury: Patient with Nemesio Castrillon creatinine staying around 3 with decreased overall urine output.  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  Unable to have renal biopsy on 9/22 due to nausea and vomiting.   Renal biopsy obtained on 9/23. 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 -Per nephrology -creatinine relatively stable today  Volume Overload   CXR with HF:  Diuresis per nephrology  S/p aortic valve replacement on 8/7 for MSSA infective endocarditis. Antibiotics completed 9/17.   Elevated Liver enzymes:  AST mildly elevated on 9/20 to 61, alk phos was 334 with normal bilirubin.  9/22 rose to AST 433 and alk phos 535 with ALT 77.  Downtrending  since then.   Unclear cause at this time.  Per GI c/s note from 9/23, suspected drug induced liver injury (concern for rifampin). Crestor has been discontinued     Last abdominal ultrasound from 9/1 noted  mildly distended gallbladder with 15 mm gallstone and small amount of sludge.  HIDA scan was negative for signs of acute cholecystitis at that time.    Hypothyroidism: Last TSH noted to be 19.826 on 10/30/2018. -Recheck TSH(8.35 appears to be trending down appropriately) -Continue levothyroxine  Anemia   throbocytopenia: Stable.  Hemoglobin 8.1 and platelets 127. -Continue to monitor  Leukocytosis: mild, stable, continue to monitor  Cognitive delay: Stable.  [facility]  DVT prophylaxis: SCD Code Status: DNR Family Communication:none at bedside Disposition Plan: per primary   Consultants:  CT surgery is primary  Nephrology  ID  General Surgery  Cards  PCCM  IR  Palliative Care  Procedures:   TEE 8/6  s/p TAVR 8/7 by Dr. Kipp Brood  s/p PEG tube  Renal biopsy 9/23    Upper Endoscopy Impression - Normal esophagus. - Erythematous mucosa in the antrum. Biopsied. - Small hiatal hernia. - Normal examined duodenum. - The examination was otherwise normal. No source for her symptoms identicified endoscopically. Await gastric biopsy results. - Return patient to hospital ward for ongoing care. - Resume previous diet today. - Continue present medications. Continue to hold Lovenox in the event that gastrostomy tube placement in IR is planned for later today. - Await pathology results from the gastric biopsies.  Antimicrobials:  Anti-infectives (From admission, onward)   Start     Dose/Rate Route Frequency Ordered Stop   11/10/18 2200  ceFAZolin (ANCEF) IVPB 2g/100 mL premix     2 g 200 mL/hr over 30 Minutes Intravenous Every 12 hours 11/10/18 1609 11/17/18 2359   11/09/18 1600  vancomycin (VANCOCIN) IVPB 1000 mg/200 mL premix  Status:  Discontinued     1,000 mg 200 mL/hr over 60 Minutes Intravenous Every 48 hours 11/07/18 1550 11/10/18 1604   11/07/18 1600  vancomycin (VANCOCIN) 2,000 mg in sodium chloride 0.9 % 500 mL IVPB      2,000 mg 250 mL/hr over 120 Minutes Intravenous  Once 11/07/18 1550 11/07/18 1931   11/07/18 1400  piperacillin-tazobactam (ZOSYN) IVPB 3.375 g  Status:  Discontinued     3.375 g 12.5 mL/hr over 240 Minutes Intravenous Every 8 hours 11/07/18 1147 11/10/18 1604   11/07/18 0830  rifampin (RIFADIN) capsule 300 mg  Status:  Discontinued     300 mg Oral Every 8 hours 11/07/18 0817 11/12/18 1231   10/20/18 0000  ceFAZolin (ANCEF) IVPB     2 g Intravenous Every 8 hours 10/20/18 1001 11/25/18 2359   10/14/18 0600  rifampin (RIFADIN) capsule 300 mg  Status:  Discontinued     300 mg Oral Every 8 hours 10/14/18 0456 11/07/18 0817   10/12/18 1200  ceFAZolin (ANCEF) IVPB 2g/100 mL premix  Status:  Discontinued     2 g 200 mL/hr over 30 Minutes Intravenous Every 8 hours 10/12/18 1053 11/07/18 1550   10/10/18 1300  ceFAZolin (ANCEF) IVPB 2g/100 mL premix  Status:  Discontinued     2 g 200 mL/hr over 30 Minutes Intravenous Every 12 hours 10/10/18 1147 10/12/18 1053   10/08/18 1400  rifampin (RIFADIN) 60 mg/mL oral suspension 300 mg  Status:  Discontinued     300 mg Oral Every 8 hours 10/08/18 0839 10/14/18 0456   10/07/18 2200  rifampin (RIFADIN) 60 mg/mL oral suspension 300 mg  Status:  Discontinued     300 mg Per Tube Every 8 hours 10/07/18 1714 10/08/18 0839   10/07/18 2000  vancomycin (VANCOCIN) IVPB 1000 mg/200 mL premix     1,000 mg 200 mL/hr over 60 Minutes Intravenous  Once 10/07/18 1336 10/07/18 2158   10/07/18 0430  cefUROXime (ZINACEF) 750 mg in sodium chloride 0.9 % 100 mL IVPB  Status:  Discontinued     750 mg 200 mL/hr over 30 Minutes Intravenous To Surgery 10/06/18 1531 10/07/18 1248   10/07/18 0400  vancomycin (VANCOCIN) 1,000 mg in sodium chloride 0.9 % 1,000 mL irrigation      Irrigation To Surgery 10/06/18 1415 10/07/18 0932   10/07/18 0400  vancomycin (VANCOCIN) 1,250 mg in sodium chloride 0.9 % 250 mL IVPB     1,250 mg 166.7 mL/hr over 90 Minutes Intravenous To Surgery 10/06/18  1415 10/07/18 0800   10/07/18 0400  cefUROXime (ZINACEF) 1.5 g in sodium chloride 0.9 % 100 mL IVPB     1.5 g 200 mL/hr over 30 Minutes Intravenous To Surgery 10/06/18 1415 10/07/18 1129   10/06/18 1430  cefUROXime (ZINACEF) 750 mg in sodium chloride 0.9 % 100 mL IVPB  Status:  Discontinued     750 mg 200 mL/hr over 30 Minutes Intravenous To Surgery 10/06/18 1415 10/06/18 1532   10/05/18 1300  nafcillin 2 g in sodium chloride 0.9 % 100 mL IVPB  Status:  Discontinued     2 g 200 mL/hr over 30 Minutes Intravenous Every 4 hours 10/05/18 1109 10/10/18 1147   10/02/18 1200  ceFAZolin (ANCEF) IVPB 2g/100 mL premix  Status:  Discontinued     2 g 200 mL/hr over 30 Minutes Intravenous Every 8 hours 10/02/18 1040 10/05/18 1109   10/01/18 2000  cefTRIAXone (ROCEPHIN) 2 g in sodium chloride 0.9 % 100 mL IVPB  Status:  Discontinued     2 g 200 mL/hr over 30 Minutes Intravenous Every 24 hours 10/01/18 1958 10/02/18 1040   10/01/18 2000  azithromycin (ZITHROMAX) 500 mg in sodium chloride 0.9 % 250 mL IVPB  Status:  Discontinued     500 mg 250 mL/hr over 60 Minutes Intravenous Every 24 hours 10/01/18 1958 10/02/18 1040      Subjective: No complaints  Objective: Vitals:   11/24/18 0333 11/24/18 0800 11/24/18 1010 11/24/18 1200  BP:  (!) 159/91 (!) 154/86 (!) 158/95  Pulse: 69 76 78 73  Resp: (!) _0 Temp:  99 F (37.2 C)  98.9 F (37.2 C)  TempSrc:  Oral  Oral  SpO2: 96% 97%  97%  Weight: 88.3 kg     Height:        Intake/Output Summary (Last 24 hours) at 11/24/2018 1710 Last data filed at 11/24/2018 1300 Gross per 24 hour  Intake 1073.75 ml  Output 501 ml  Net 572.75 ml   Filed Weights   11/22/18 0515 11/23/18 0417 11/24/18 0333  Weight: 90.5 kg 88.6 kg 88.3 kg    Examination:  General exam: Appears calm and comfortable  Respiratory system: Clear to auscultation. Respiratory effort normal. Cardiovascular system: S1 & S2 heard, RRR. [name] Gastrointestinal system: Abdomen is  nondistended, soft and nontender.  Central nervous system: Alert. No focal neurological deficits. Extremities: bilateral LE edema Skin: No rashes, lesions or ulcers Psychiatry: Judgement and insight appear normal. Mood & affect appropriate.     Data Reviewed: I have personally reviewed following labs and  imaging studies  CBC: Recent Labs  Lab 11/19/18 0532 11/20/18 1433 11/22/18 0500 11/23/18 0441 11/24/18 0423  WBC 16.7* 15.3* 11.1* 9.7 10.7*  HGB 8.5* 8.3* 7.9* 8.1* 8.1*  HCT 28.2* 27.1* 26.9* 26.8* 27.2*  MCV 98.3 98.9 99.3 100.0 99.3  PLT 102* 122* 144* 127* 174*   Basic Metabolic Panel: Recent Labs  Lab 11/19/18 0532 11/20/18 1433 11/22/18 0500 11/23/18 0441 11/24/18 0423  NA 140 143 143 143 144  K 4.2 4.4 4.2 4.4 4.4  CL 103 104 104 105 104  CO2 _0 GLUCOSE 124* 118* 135* 112* 125*  BUN 96* 95* 104* 110* 109*  CREATININE 3.11* 3.17* 3.20* 3.22* 3.16*  CALCIUM 9.8 9.8 9.2 9.2 9.1  PHOS 5.5*  --   --   --   --    GFR: Estimated Creatinine Clearance: 20.3 mL/min (Celsey Asselin) (by C-G formula based on SCr of 3.16 mg/dL (H)). Liver Function Tests: Recent Labs  Lab 11/19/18 0532 11/20/18 1433 11/22/18 0500 11/23/18 0441 11/24/18 0423  AST  --  61* 433* 242* 194*  ALT  --  9 77* 79* 79*  ALKPHOS  --  334* 535* 530* 520*  BILITOT  --  0.8 0.7 0.7 0.9  PROT  --  6.1* 5.5* 5.5* 5.6*  ALBUMIN 1.8* 1.8* 1.8* 1.8* 1.8*   No results for input(s): LIPASE, AMYLASE in the last 168 hours. No results for input(s): AMMONIA in the last 168 hours. Coagulation Profile: Recent Labs  Lab 11/22/18 0908  INR 1.5*   Cardiac Enzymes: No results for input(s): CKTOTAL, CKMB, CKMBINDEX, TROPONINI in the last 168 hours. BNP (last 3 results) No results for input(s): PROBNP in the last 8760 hours. HbA1C: No results for input(s): HGBA1C in the last 72 hours. CBG: Recent Labs  Lab 11/23/18 1403 11/23/18 2018 11/24/18 0323 11/24/18 1226 11/24/18 1634  GLUCAP 65* 97  121* 156* 154*   Lipid Profile: No results for input(s): CHOL, HDL, LDLCALC, TRIG, CHOLHDL, LDLDIRECT in the last 72 hours. Thyroid Function Tests: Recent Labs    11/23/18 1541 11/24/18 0924  TSH 8.350*  --   FREET4  --  0.92   Anemia Panel: No results for input(s): VITAMINB12, FOLATE, FERRITIN, TIBC, IRON, RETICCTPCT in the last 72 hours. Sepsis Labs: No results for input(s): PROCALCITON, LATICACIDVEN in the last 168 hours.  Recent Results (from the past 240 hour(s))  Culture, blood (routine x 2)     Status: None   Collection Time: 11/18/18  5:47 PM   Specimen: BLOOD  Result Value Ref Range Status   Specimen Description BLOOD RIGHT ANTECUBITAL  Final   Special Requests   Final    BOTTLES DRAWN AEROBIC AND ANAEROBIC Blood Culture adequate volume   Culture   Final    NO GROWTH 5 DAYS Performed at [facility], [address] [place], [place] [address]    Report Status 11/23/2018 FINAL  Final  Culture, blood (routine x 2)     Status: None   Collection Time: 11/18/18  5:53 PM   Specimen: BLOOD RIGHT HAND  Result Value Ref Range Status   Specimen Description BLOOD RIGHT HAND  Final   Special Requests   Final    BOTTLES DRAWN AEROBIC AND ANAEROBIC Blood Culture adequate volume   Culture   Final    NO GROWTH 5 DAYS Performed at [facility], [place] [address] [place], [place] [address]    Report Status 11/23/2018 FINAL  Final  Culture, Urine     Status: Abnormal   Collection Time: 11/20/18 11:02 AM   Specimen: Urine, Catheterized  Result Value Ref Range Status   Specimen Description URINE, CATHETERIZED  Final   Special Requests NONE  Final   Culture (Kenniya Westrich)  Final    <10,000 COLONIES/mL INSIGNIFICANT GROWTH Performed at [facility], [address] [place], [place] [address]    Report Status 11/21/2018 FINAL  Final         Radiology Studies: US Biopsy (kidney)  Result Date: 11/23/2018 INDICATION: Acute renal insufficiency. Please perform  ultrasound-guided renal biopsy for tissue diagnostic purposes. EXAM: ULTRASOUND GUIDED RENAL BIOPSY COMPARISON:  CT abdomen and pelvis - 10/31/2018 MEDICATIONS: None. ANESTHESIA/SEDATION: Fentanyl 25 mcg IV; Versed 1 mg IV Total Moderate Sedation time: 10 minutes; The patient was continuously monitored during the procedure by the interventional radiology nurse under my direct supervision. COMPLICATIONS: None immediate. PROCEDURE: Informed written consent was obtained from the patient's aunt after Manasi Dishon discussion of the risks, benefits and alternatives to treatment. The patient understands and consents the procedure. Marquee Fuchs timeout was performed prior to the initiation of the procedure. Ultrasound scanning was performed of the bilateral flanks. The inferior pole of the left kidney was selected for biopsy due to location and sonographic window. The procedure was planned. The operative site was prepped and draped in the usual sterile fashion. The overlying soft tissues were anesthetized with 1% lidocaine with epinephrine. Carilyn Woolston 17 gauge core needle biopsy device was advanced into the inferior cortex of the left kidney and 2 core biopsies were obtained under direct ultrasound guidance. Images were saved for documentation purposes. The biopsy device was removed and hemostasis was obtained with manual compression. Post procedural scanning was negative for significant post procedural hemorrhage or additional complication. Zehava Turski dressing was placed. The patient tolerated the procedure well without immediate post procedural complication. IMPRESSION: Technically successful ultrasound guided left renal biopsy. Electronically Signed   By: Sandi Mariscal M.D.   On: 11/23/2018 15:06        Scheduled Meds:  sodium chloride   Intravenous Once   amLODipine  5 mg Oral Daily   calcium-vitamin D  1 tablet Oral Q breakfast   Chlorhexidine Gluconate Cloth  6 each Topical Daily   feeding supplement (OSMOLITE 1.5 CAL)  1,000 mL Per Tube Q24H    feeding supplement (PRO-STAT SUGAR FREE 64)  30 mL Per Tube TID   FLUoxetine  10 mg Oral Daily   levothyroxine  150 mcg Oral Q0600   mouth rinse  15 mL Mouth Rinse BID   metoCLOPramide (REGLAN) injection  10 mg Intravenous Q6H   metoprolol tartrate  25 mg Oral BID   montelukast  10 mg Oral QHS   multivitamin with minerals  1 tablet Oral Daily   pantoprazole sodium  40 mg Per Tube Daily   phytonadione  5 mg Per Tube Daily   polyethylene glycol  17 g Per Tube Q M,W,F   sodium chloride flush  3 mL Intravenous Q12H   tamsulosin  0.4 mg Oral Daily   Continuous Infusions:  sodium chloride 10 mL/hr at 11/16/18 1500     LOS: 54 days    Time spent: over 30 min    Fayrene Helper, MD [facility] Pager AMION  If 7PM-7AM, please contact night-coverage [email or web address] Password [email or web address] 11/24/2018, 5:10 PM

## 2018-11-25 LAB — GLUCOSE, CAPILLARY
Glucose-Capillary: 128 mg/dL — ABNORMAL HIGH (ref 70–99)
Glucose-Capillary: 141 mg/dL — ABNORMAL HIGH (ref 70–99)
Glucose-Capillary: 109 mg/dL — ABNORMAL HIGH (ref 70–99)
Glucose-Capillary: 127 mg/dL — ABNORMAL HIGH (ref 70–99)

## 2018-11-25 LAB — COMPREHENSIVE METABOLIC PANEL
ALT: 64 U/L — ABNORMAL HIGH (ref 0–44)
AST: 91 U/L — ABNORMAL HIGH (ref 15–41)
Albumin: 2 g/dL — ABNORMAL LOW (ref 3.5–5.0)
Alkaline Phosphatase: 497 U/L — ABNORMAL HIGH (ref 38–126)
Anion gap: 10 (ref 5–15)
BUN: 107 mg/dL — ABNORMAL HIGH (ref 6–20)
CO2: 31 mmol/L (ref 22–32)
Calcium: 8.8 mg/dL — ABNORMAL LOW (ref 8.9–10.3)
Chloride: 105 mmol/L (ref 98–111)
Creatinine, Ser: 3.23 mg/dL — ABNORMAL HIGH (ref 0.44–1.00)
GFR calc Af Amer: 18 mL/min — ABNORMAL LOW (ref >60)
GFR calc non Af Amer: 15 mL/min — ABNORMAL LOW (ref >60)
Glucose, Bld: 135 mg/dL — ABNORMAL HIGH (ref 70–99)
Potassium: 4.8 mmol/L (ref 3.5–5.1)
Sodium: 146 mmol/L — ABNORMAL HIGH (ref 135–145)
Total Bilirubin: 0.6 mg/dL (ref 0.3–1.2)
Total Protein: 5.5 g/dL — ABNORMAL LOW (ref 6.5–8.1)

## 2018-11-25 LAB — HEPATITIS B CORE ANTIBODY, TOTAL: Hep B Core Total Ab: NEGATIVE

## 2018-11-25 LAB — HEPATITIS B SURFACE ANTIGEN: Hepatitis B Surface Ag: NEGATIVE

## 2018-11-25 LAB — CBC
HCT: 28.7 % — ABNORMAL LOW (ref 36.0–46.0)
Hemoglobin: 8.3 g/dL — ABNORMAL LOW (ref 12.0–15.0)
MCH: 29.3 pg (ref 26.0–34.0)
MCHC: 28.9 g/dL — ABNORMAL LOW (ref 30.0–36.0)
MCV: 101.4 fL — ABNORMAL HIGH (ref 80.0–100.0)
Platelets: 121 10*3/uL — ABNORMAL LOW (ref 150–400)
RBC: 2.83 MIL/uL — ABNORMAL LOW (ref 3.87–5.11)
RDW: 15.6 % — ABNORMAL HIGH (ref 11.5–15.5)
WBC: 11.2 10*3/uL — ABNORMAL HIGH (ref 4.0–10.5)
nRBC: 0.5 % — ABNORMAL HIGH (ref 0.0–0.2)

## 2018-11-25 LAB — HEPATITIS B SURFACE ANTIBODY,QUALITATIVE: Hep B S Ab: NONREACTIVE

## 2018-11-25 LAB — HEPATITIS A ANTIBODY, IGM: Hep A IgM: NEGATIVE

## 2018-11-25 LAB — HEPATITIS C ANTIBODY: HCV Ab: 0.1 s/co ratio (ref 0.0–0.9)

## 2018-11-25 NOTE — Progress Notes (Addendum)
Physical Therapy Treatment Patient Details Name: Audrey Collier MRN: [id number] DOB: 1961/01/05 Today's Date: 11/25/2018    History of Present Illness 58 yo female presented with fever, vomiting, weakness.  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  Echo showed vegetation.  Transferred to ICU 8/04 due to respiratory failure.  Underwent AVR with TEE intraoprative on 10/07/18. Extubated 10/08/18.  PEG placed 11/04/18.    PT Comments    Pt received in bed. Encouragement needed to participate in therapy. She required +2 mod assist bed mobility and +2 max assist transfers. Pt positioned in recliner with feet elevated at end of session. Feeding tube placed on hold for mobility. Once pt positioned in recliner, returned feeding tube to run. Pt gagging and proceeded to vomit moderate amount of phlegm and tube feed.  Shakes her head yes that she feels better afterward. Vitals stable with SpO2 95% on 2L. Assisted pt in cleaning up/changing gown. RN notified. PT goals reviewed and updated as needed.    Follow Up Recommendations  SNF;LTACH     Equipment Recommendations  Other (comment)(TBD)    Recommendations for Other Services       Precautions / Restrictions Precautions Precautions: Fall;Sternal    Mobility  Bed Mobility Overal bed mobility: Needs Assistance Bed Mobility: Supine to Sit     Supine to sit: +2 for physical assistance;Mod assist     General bed mobility comments: max directional cues to move LEs off bed. Assist to elevate trunk. Use of bed pad to scoot to EOB.  Transfers Overall transfer level: Needs assistance Equipment used: None Transfers: Sit to/from [facility] Sit to Stand: Max assist;+2 physical assistance Stand pivot transfers: Max assist;+2 physical assistance       General transfer comment: Max assist to stand and pivot right bed to recliner.  Ambulation/Gait                 Stairs             Wheelchair Mobility     Modified Rankin (Stroke Patients Only)       Balance Overall balance assessment: Needs assistance Sitting-balance support: No upper extremity supported;Feet supported Sitting balance-Leahy Scale: Fair Sitting balance - Comments: pt able to maintain EOB balance with close supervision   Standing balance support: Bilateral upper extremity supported Standing balance-Leahy Scale: Zero Standing balance comment: dependent on physical assist                            Cognition Arousal/Alertness: Awake/alert Behavior During Therapy: Flat affect Overall Cognitive Status: History of cognitive impairments - at baseline                                 General Comments: Very flat. Minimal verbal communications. Shakes head no to most questions.      Exercises      General Comments General comments (skin integrity, edema, etc.): VSS. Pt on 2L O2.      Pertinent Vitals/Pain Pain Assessment: Faces Faces Pain Scale: No hurt    Home Living                      Prior Function            PT Goals (current goals can now be found in the care plan section) Acute Rehab PT Goals Patient Stated Goal: not stated PT  Goal Formulation: Patient unable to participate in goal setting Time For Goal Achievement: 12/08/18 Potential to Achieve Goals: Fair Progress towards PT goals: Progressing toward goals    Frequency    Min 2X/week      PT Plan Current plan remains appropriate;Frequency needs to be updated    Co-evaluation              AM-PAC PT "6 Clicks" Mobility   Outcome Measure  Help needed turning from your back to your side while in a flat bed without using bedrails?: A Lot Help needed moving from lying on your back to sitting on the side of a flat bed without using bedrails?: A Lot Help needed moving to and from a bed to a chair (including a wheelchair)?: A Lot Help needed standing up from a chair using your arms (e.g., wheelchair or  bedside chair)?: A Lot Help needed to walk in hospital room?: Total Help needed climbing 3-5 steps with a railing? : Total 6 Click Score: 10    End of Session Equipment Utilized During Treatment: Gait belt;Oxygen Activity Tolerance: Patient limited by fatigue Patient left: in chair;with call bell/phone within reach Nurse Communication: Mobility status;Need for lift equipment(lift pad in chair) PT Visit Diagnosis: Muscle weakness (generalized) (M62.81);Other abnormalities of gait and mobility (R26.89)     Time: 8329-1916 PT Time Calculation (min) (ACUTE ONLY): 27 min  Charges:  $Therapeutic Activity: 23-37 mins                     Aida Raider, PT  Office # [phone] Pager [phone]    Ilda Foil 11/25/2018, 12:27 PM

## 2018-11-25 NOTE — Progress Notes (Addendum)
      [address]       [place] [address]             [phone]      14 Days Post-Op Procedure(s) (LRB): ESOPHAGOGASTRODUODENOSCOPY (EGD) WITH PROPOFOL (N/A) BIOPSY  Subjective: She was sleeping this am awakened. She denies abdominal pain.  Objective: Vital signs in last 24 hours: Temp:  [97.6 F (36.4 C)-99.2 F (37.3 C)] 99.2 F (37.3 C) (09/25 0400) Pulse Rate:  [68-78] 75 (09/25 0600) Cardiac Rhythm: Normal sinus rhythm (09/25 0700) Resp:  [17-22] 17 (09/25 0600) BP: (134-160)/(79-95) 160/92 (09/25 0600) SpO2:  [95 %-99 %] 96 % (09/25 0600) Weight:  [87.3 kg] 87.3 kg (09/25 0400)  Intake/Output from previous day: 09/24 0701 - 09/25 0700 In: 1165.8 [P.O.:500; NG/GT:615.8] Out: 1000 [Urine:1000]   Heart: regular rate and rhythm Lungs: Clear bilaterally Abdomen: Soft, distended, hypoactive BS Extremities: Mild edema Wound: sternotomy well healed  Lab Results: Recent Labs    11/24/18 0423 11/25/18 0431  WBC 10.7* 11.2*  HGB 8.1* 8.3*  HCT 27.2* 28.7*  PLT 126* 121*   BMET:  Recent Labs    11/24/18 0423 11/25/18 0431  NA 144 146*  K 4.4 4.8  CL 104 105  CO2 30 31  GLUCOSE 125* 135*  BUN 109* 107*  CREATININE 3.16* 3.23*  CALCIUM 9.1 8.8*    PT/INR:  Recent Labs    11/22/18 0908  LABPROT 18.2*  INR 1.5*   ABG    Component Value Date/Time   PHART 7.430 10/08/2018 0920   HCO3 23.6 10/08/2018 0920   TCO2 25 10/08/2018 0920   ACIDBASEDEF 6.0 (H) 10/08/2018 0307   O2SAT 94.0 10/08/2018 0920   CBG (last 3)  Recent Labs    11/24/18 2022 11/25/18 0405 11/25/18 0755  GLUCAP 123* 128* 141*    Assessment/Plan: S/P Procedure(s) (LRB): ESOPHAGOGASTRODUODENOSCOPY (EGD) WITH PROPOFOL (N/A) BIOPSY  1. CV- She has been maintaining SR with HR in the 70's. On Lopressor 25 mg bid and Amlodipine 5 mg daily. 2. Pulm- no acute issues, continue IS 3. AKI- creatinine slightly decreased to 3.23. S/p kidney biopsy yesterday. 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 No need for immunomodulating therapy at this point per path per their findings.   4. GI- continues to have poor oral intake, on tube feeds at night. She has severe hypoalbuminemia with anasarca. 5. Deconditioning PT/OT 6. Anemia-H and H this am stable at 8.3 and 28.7 7. Mild thrombocytopenia-platelets this am 121,000 8. Hypothyroidism-continue Levothyroxine 150 mcg daily  Agree with above Lajuana Matte

## 2018-11-25 NOTE — Progress Notes (Signed)
Care plan reviewed, Pt is progressing. No acute distress noted tonight. Vital signs stable, remains afebrile. Denied nausea, but low appetite, she refused to eat her dinner. Complained having headache at night. Tylenol given, after med given, she was able to sleep well. Continue to monitor.  Kennyth Lose, RN

## 2018-11-25 NOTE — Progress Notes (Signed)
[facility] Progress Note    Assessment/ Plan:   1. Acute kidney injury. Remains nonoliguric. Cr in the mid 3s. 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 Serologies and complements negative. Discussed with aunt Kendal Hymen that biopsy may be warranted-->she is in agreement. UP/C 6.5, no history of DM. Sending A1c-->4.9 on 9/19.Crfluctuating but notrendingdown consistently.   -Appreciaterenalbxby Dr. Delaine Lame a definitive diagnosison 9/23 -> confirmed today that [facility] has received the sample. - Fortunately no acute indication for dialysis; hopefully the cr is plateauing. Renal function appears to be stabilizing.  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 By her history it's from endocarditis and not much from the renal standpoint to do at this time. No immunomodulatory therapies.   2. 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 Completed antibiotics  3. Severe hypoalbuminemia. Continue enteral nutrition as able, getting meds for constipation-> BM this AM.  4. Cognitive delay.  5. Anemia. Hemoglobin stable for now  6. Hyperkalemia. Lokelma prn.  7. Anasarca: 40 IV Lasix 9/18 and 9/19. Alb low so expect continued third spacing and don't expect it to completely resolve.  - Appears overloaded -> will give another dose of Lasix IV today.  8. Leukocytosis/ low-grade fever: WBC ct up,and Tmax 100yest everning.  Subjective:   Doesn't appear dyspneic.    Objective:   BP (!) 150/90 (BP Location: Right Arm)   Pulse 73   Temp 97.6 F (36.4 C) (Oral)   Resp (!) 25   Ht 5\' 2"  (1.575 m)   Wt 87.3 kg   LMP 04/16/2013 (Approximate)   SpO2 95%   BMI 35.20 kg/m   Intake/Output Summary (Last 24 hours) at 11/25/2018 1433 Last data filed at 11/25/2018 1000 Gross per 24 hour   Intake 1003.75 ml  Output 700 ml  Net 303.75 ml   Weight change: -1 kg  Physical Exam: General:Awake, pleasant, tachypneic HEENT: some red rash on cheeks L > R Neck: + JVD, no adenopathy CV: Heart RRR  Lungs:Rales bibasilar Abd: abd slightly distended, nontender with normal BS Extremities: +3 upper and lower extremity edema Skin: erythematous rash on cheeks- doesn't cross nasolabial folds   Imaging: No results found.  Labs: BMET Recent Labs  Lab 11/19/18 0532 11/20/18 1433 11/22/18 0500 11/23/18 0441 11/24/18 0423 11/25/18 0431  NA 140 143 143 143 144 146*  K 4.2 4.4 4.2 4.4 4.4 4.8  CL 103 104 104 105 104 105  CO2 26 28 28 28 30 31   GLUCOSE 124* 118* 135* 112* 125* 135*  BUN 96* 95* 104* 110* 109* 107*  CREATININE 3.11* 3.17* 3.20* 3.22* 3.16* 3.23*  CALCIUM 9.8 9.8 9.2 9.2 9.1 8.8*  PHOS 5.5*  --   --   --   --   --    CBC Recent Labs  Lab 11/22/18 0500 11/23/18 0441 11/24/18 0423 11/25/18 0431  WBC 11.1* 9.7 10.7* 11.2*  HGB 7.9* 8.1* 8.1* 8.3*  HCT 26.9* 26.8* 27.2* 28.7*  MCV 99.3 100.0 99.3 101.4*  PLT 144* 127* 126* 121*    Medications:    . sodium chloride   Intravenous Once  . amLODipine  5 mg Oral Daily  . calcium-vitamin D  1 tablet Oral Q breakfast  . Chlorhexidine Gluconate Cloth  6 each Topical Daily  . feeding supplement (OSMOLITE 1.5 CAL)  1,000 mL  Per Tube Q24H  . feeding supplement (PRO-STAT SUGAR FREE 64)  30 mL Per Tube TID  . FLUoxetine  10 mg Oral Daily  . levothyroxine  150 mcg Oral Q0600  . mouth rinse  15 mL Mouth Rinse BID  . metoCLOPramide (REGLAN) injection  10 mg Intravenous Q6H  . metoprolol tartrate  25 mg Oral BID  . montelukast  10 mg Oral QHS  . multivitamin with minerals  1 tablet Oral Daily  . pantoprazole sodium  40 mg Per Tube Daily  . polyethylene glycol  17 g Per Tube Q M,W,F  . sodium chloride flush  3 mL Intravenous Q12H  . tamsulosin  0.4 mg Oral Daily      Otelia Santee, MD 11/25/2018, 2:33  PM

## 2018-11-25 NOTE — Progress Notes (Signed)
PROGRESS NOTE    Audrey Collier  WUJ:[id number] DOB: 03/26/1960 DOA: 10/01/2018 PCP: Audrey Gravel, MD   Brief Narrative:  Audrey Collier is Audrey Collier 58 year old female with past medical history significant of cognitive delay, anxiety, hypothyroidism, and post menopausal bleeding (worked up 5 years ago), who presented on 8/1 with altered mental status from group home with fever up to 103.8 F and emesis.  Chest x-ray showed bilateral pneumonia and patient was noted to be COVID-19 negative x2.  Treated initially with Rocephin and azithromycin for suspected community-acquired pneumonia.  Blood cultures grew out MSSA and patient was switched to Ancef with rifampin added per ID.  Patient required intubation on 8/4, due to respiratory failure and care was taken over by PCCM.  On 8/6 patient underwent TEE showing large aortic vegetation.  CT surgery was consulted, and she was taken for aortic valve replacement on 10/07/2018.  Postop patient noted to have renal insufficiency and antibiotics were adjusted.  Foley catheters had to be placed due to urinary retention.  She had continued to complain of intermittent abdominal pain and nausea. Overall patient's oral intake had been minimal and therefore core track was placed with dietary consultation for tube feeds.  GI was consulted and performed EGD which did not reveal any acute cause for patient's symptoms on 9/3.  General surgery was consulted due to mildly distended gallbladder concerning for cholecystitis, but HIDA scan was negative.  Due to lack of oral intake she underwent percutaneous gastric tube placement by IR on 9/4.  Nephrology consulted after creatinine elevated to 3.48. They were concerned for likely AIN vs post strep glomerulonephritis.  On 9/7 patient developed projectile vomiting and hemoglobin dropped down to 6.8 requiring transfusion of PRBCs.  Required transfer back into the ICU for close monitoring and palliative care was consulted at that time where family made  patient DNR/DNI.  Patient completed antibiotics for endocarditis on 9/17.  Patient developed low-grade fever on 9/18 up to 100.7 F.  Urinalysis concerning for infection, but urine culture inconclusive.  Labs on 9/22 show alkaline phosphatase 535, albumin 1.8, AST 433, ALT 77, and total bilirubin 0.7.  TRH called help with overall medical management.  Assessment & Plan:   Principal Problem:   Staphylococcus aureus bacteremia with sepsis ([place]) Active Problems:   Severe sepsis (Audrey Collier)   Bacteremia due to methicillin susceptible Staphylococcus aureus (MSSA)   Aortic valve endocarditis   Acute respiratory failure with hypoxia ([name])   S/P AVR (aortic valve replacement)   Pressure injury of skin   Palliative care by specialist  Severe hypoalbuminemia with anasarca: Patient still with poor appetite. Albumin levels around 1.8.  Suspect patient likely third spacing due to poor nutritional status. -Continue strict intake and output  -Continue tube feeds per dietary  Acute kidney injury   Mild Hypernatremia: Patient with [place]lier creatinine staying around 3 with decreased overall urine output.  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  Unable to have renal biopsy on 9/22 due to nausea and vomiting.   Renal biopsy obtained on 9/23. 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 - Creatinine relatively stable today, nonoliguric - Per nephrology - Volume per nephrology - creatinine relatively stable today  Volume Overload   CXR with HF:  Diuresis per nephrology (received lasix x1 9/24)  S/p aortic valve replacement on 8/7 for MSSA infective endocarditis. Antibiotics completed 9/17.   Elevated Liver enzymes:  AST mildly elevated on 9/20 to 61, alk phos  was 334 with normal bilirubin.  9/22 rose to AST 433 and alk phos 535 with ALT 77.  Downtrending since then.   Unclear cause at this time.  Per GI c/s note from 9/23, suspected  drug induced liver injury (concern for rifampin). Continuing to improve Crestor has been discontinued     Last abdominal ultrasound from 9/1 noted mildly distended gallbladder with 15 mm gallstone and small amount of sludge.  HIDA scan was negative for signs of acute cholecystitis at that time.    Hypothyroidism: Last TSH noted to be 19.826 on 10/30/2018. -Recheck TSH(8.35 appears to be trending down appropriately) -Continue levothyroxine  Anemia   throbocytopenia: Stable.  Hemoglobin 8.1 and platelets 127. -Continue to monitor - stable today  Leukocytosis: mild, stable, continue to monitor  Cognitive delay: Stable.  [facility] previous  Nausea   Vomiting: this has been intermittent.  Continue reglan.   DVT prophylaxis: SCD Code Status: DNR Family Communication:none at bedside Disposition Plan: per primary   Consultants:  CT surgery is primary  Nephrology  ID  General Surgery  Cards  PCCM  IR  Palliative Care  Procedures:   TEE 8/6  s/p TAVR 8/7 by Dr. Kipp Brood  s/p PEG tube  Renal biopsy 9/23    Upper Endoscopy Impression - Normal esophagus. - Erythematous mucosa in the antrum. Biopsied. - Small hiatal hernia. - Normal examined duodenum. - The examination was otherwise normal. No source for her symptoms identicified endoscopically. Await gastric biopsy results. - Return patient to hospital ward for ongoing care. - Resume previous diet today. - Continue present medications. Continue to hold Lovenox in the event that gastrostomy tube placement in IR is planned for later today. - Await pathology results from the gastric biopsies.  Antimicrobials:  Anti-infectives (From admission, onward)   Start     Dose/Rate Route Frequency Ordered Stop   11/10/18 2200  ceFAZolin (ANCEF) IVPB 2g/100 mL premix     2 g 200 mL/hr over 30 Minutes Intravenous Every 12 hours 11/10/18 1609 11/17/18 2359   11/09/18 1600  vancomycin (VANCOCIN)  IVPB 1000 mg/200 mL premix  Status:  Discontinued     1,000 mg 200 mL/hr over 60 Minutes Intravenous Every 48 hours 11/07/18 1550 11/10/18 1604   11/07/18 1600  vancomycin (VANCOCIN) 2,000 mg in sodium chloride 0.9 % 500 mL IVPB     2,000 mg 250 mL/hr over 120 Minutes Intravenous  Once 11/07/18 1550 11/07/18 1931   11/07/18 1400  piperacillin-tazobactam (ZOSYN) IVPB 3.375 g  Status:  Discontinued     3.375 g 12.5 mL/hr over 240 Minutes Intravenous Every 8 hours 11/07/18 1147 11/10/18 1604   11/07/18 0830  rifampin (RIFADIN) capsule 300 mg  Status:  Discontinued     300 mg Oral Every 8 hours 11/07/18 0817 11/12/18 1231   10/20/18 0000  ceFAZolin (ANCEF) IVPB     2 g Intravenous Every 8 hours 10/20/18 1001 11/25/18 2359   10/14/18 0600  rifampin (RIFADIN) capsule 300 mg  Status:  Discontinued     300 mg Oral Every 8 hours 10/14/18 0456 11/07/18 0817   10/12/18 1200  ceFAZolin (ANCEF) IVPB 2g/100 mL premix  Status:  Discontinued     2 g 200 mL/hr over 30 Minutes Intravenous Every 8 hours 10/12/18 1053 11/07/18 1550   10/10/18 1300  ceFAZolin (ANCEF) IVPB 2g/100 mL premix  Status:  Discontinued     2 g 200 mL/hr over 30 Minutes Intravenous Every 12 hours  10/10/18 1147 10/12/18 1053   10/08/18 1400  rifampin (RIFADIN) 60 mg/mL oral suspension 300 mg  Status:  Discontinued     300 mg Oral Every 8 hours 10/08/18 0839 10/14/18 0456   10/07/18 2200  rifampin (RIFADIN) 60 mg/mL oral suspension 300 mg  Status:  Discontinued     300 mg Per Tube Every 8 hours 10/07/18 1714 10/08/18 0839   10/07/18 2000  vancomycin (VANCOCIN) IVPB 1000 mg/200 mL premix     1,000 mg 200 mL/hr over 60 Minutes Intravenous  Once 10/07/18 1336 10/07/18 2158   10/07/18 0430  cefUROXime (ZINACEF) 750 mg in sodium chloride 0.9 % 100 mL IVPB  Status:  Discontinued     750 mg 200 mL/hr over 30 Minutes Intravenous To Surgery 10/06/18 1531 10/07/18 1248   10/07/18 0400  vancomycin (VANCOCIN) 1,000 mg in sodium chloride 0.9 %  1,000 mL irrigation      Irrigation To Surgery 10/06/18 1415 10/07/18 0932   10/07/18 0400  vancomycin (VANCOCIN) 1,250 mg in sodium chloride 0.9 % 250 mL IVPB     1,250 mg 166.7 mL/hr over 90 Minutes Intravenous To Surgery 10/06/18 1415 10/07/18 0800   10/07/18 0400  cefUROXime (ZINACEF) 1.5 g in sodium chloride 0.9 % 100 mL IVPB     1.5 g 200 mL/hr over 30 Minutes Intravenous To Surgery 10/06/18 1415 10/07/18 1129   10/06/18 1430  cefUROXime (ZINACEF) 750 mg in sodium chloride 0.9 % 100 mL IVPB  Status:  Discontinued     750 mg 200 mL/hr over 30 Minutes Intravenous To Surgery 10/06/18 1415 10/06/18 1532   10/05/18 1300  nafcillin 2 g in sodium chloride 0.9 % 100 mL IVPB  Status:  Discontinued     2 g 200 mL/hr over 30 Minutes Intravenous Every 4 hours 10/05/18 1109 10/10/18 1147   10/02/18 1200  ceFAZolin (ANCEF) IVPB 2g/100 mL premix  Status:  Discontinued     2 g 200 mL/hr over 30 Minutes Intravenous Every 8 hours 10/02/18 1040 10/05/18 1109   10/01/18 2000  cefTRIAXone (ROCEPHIN) 2 g in sodium chloride 0.9 % 100 mL IVPB  Status:  Discontinued     2 g 200 mL/hr over 30 Minutes Intravenous Every 24 hours 10/01/18 1958 10/02/18 1040   10/01/18 2000  azithromycin (ZITHROMAX) 500 mg in sodium chloride 0.9 % 250 mL IVPB  Status:  Discontinued     500 mg 250 mL/hr over 60 Minutes Intravenous Every 24 hours 10/01/18 1958 10/02/18 1040      Subjective: Feels ok, no complaints  Objective: Vitals:   11/25/18 0400 11/25/18 0600 11/25/18 1000 11/25/18 1146  BP: 138/79 (!) 160/92 (!) 149/87 (!) 150/90  Pulse: 74 75 76 73  Resp: 19 17 (!) 25 (!) 25  Temp: 99.2 F (37.3 C)   97.6 F (36.4 C)  TempSrc: Oral   Oral  SpO2: 97% 96% 96% 95%  Weight: 87.3 kg     Height:        Intake/Output Summary (Last 24 hours) at 11/25/2018 1341 Last data filed at 11/25/2018 1000 Gross per 24 hour  Intake 1003.75 ml  Output 700 ml  Net 303.75 ml   Filed Weights   11/23/18 0417 11/24/18 0333  11/25/18 0400  Weight: 88.6 kg 88.3 kg 87.3 kg    Examination:  General: No acute distress. Cardiovascular: Heart sounds show Yates Weisgerber regular rate, and rhythm Lungs: Clear to auscultation bilaterally  Abdomen: Soft, nontender, nondistended.  Peg tube  Neurological: Alert. Moves  all extremities 4. Cranial nerves II through XII grossly intact. Skin: Warm and dry. No rashes or lesions. Extremities: No clubbing or cyanosis. Bilateral LE edema     Data Reviewed: I have personally reviewed following labs and imaging studies  CBC: Recent Labs  Lab 11/20/18 1433 11/22/18 0500 11/23/18 0441 11/24/18 0423 11/25/18 0431  WBC 15.3* 11.1* 9.7 10.7* 11.2*  HGB 8.3* 7.9* 8.1* 8.1* 8.3*  HCT 27.1* 26.9* 26.8* 27.2* 28.7*  MCV 98.9 99.3 100.0 99.3 101.4*  PLT 122* 144* 127* 126* 275*   Basic Metabolic Panel: Recent Labs  Lab 11/19/18 0532 11/20/18 1433 11/22/18 0500 11/23/18 0441 11/24/18 0423 11/25/18 0431  NA 140 143 143 143 144 146*  K 4.2 4.4 4.2 4.4 4.4 4.8  CL 103 104 104 105 104 105  CO2 _0 GLUCOSE 124* 118* 135* 112* 125* 135*  BUN 96* 95* 104* 110* 109* 107*  CREATININE 3.11* 3.17* 3.20* 3.22* 3.16* 3.23*  CALCIUM 9.8 9.8 9.2 9.2 9.1 8.8*  PHOS 5.5*  --   --   --   --   --    GFR: Estimated Creatinine Clearance: 19.7 mL/min (Corinthian Kemler) (by C-G formula based on SCr of 3.23 mg/dL (H)). Liver Function Tests: Recent Labs  Lab 11/20/18 1433 11/22/18 0500 11/23/18 0441 11/24/18 0423 11/25/18 0431  AST 61* 433* 242* 194* 91*  ALT 9 77* 79* 79* 64*  ALKPHOS 334* 535* 530* 520* 497*  BILITOT 0.8 0.7 0.7 0.9 0.6  PROT 6.1* 5.5* 5.5* 5.6* 5.5*  ALBUMIN 1.8* 1.8* 1.8* 1.8* 2.0*   No results for input(s): LIPASE, AMYLASE in the last 168 hours. No results for input(s): AMMONIA in the last 168 hours. Coagulation Profile: Recent Labs  Lab 11/22/18 0908  INR 1.5*   Cardiac Enzymes: No results for input(s): CKTOTAL, CKMB, CKMBINDEX, TROPONINI in the last 168  hours. BNP (last 3 results) No results for input(s): PROBNP in the last 8760 hours. HbA1C: No results for input(s): HGBA1C in the last 72 hours. CBG: Recent Labs  Lab 11/24/18 1226 11/24/18 1634 11/24/18 2022 11/25/18 0405 11/25/18 0755  GLUCAP 156* 154* 123* 128* 141*   Lipid Profile: No results for input(s): CHOL, HDL, LDLCALC, TRIG, CHOLHDL, LDLDIRECT in the last 72 hours. Thyroid Function Tests: Recent Labs    11/23/18 1541 11/24/18 0924  TSH 8.350*  --   FREET4  --  0.92   Anemia Panel: No results for input(s): VITAMINB12, FOLATE, FERRITIN, TIBC, IRON, RETICCTPCT in the last 72 hours. Sepsis Labs: No results for input(s): PROCALCITON, LATICACIDVEN in the last 168 hours.  Recent Results (from the past 240 hour(s))  Culture, blood (routine x 2)     Status: None   Collection Time: 11/18/18  5:47 PM   Specimen: BLOOD  Result Value Ref Range Status   Specimen Description BLOOD RIGHT ANTECUBITAL  Final   Special Requests   Final    BOTTLES DRAWN AEROBIC AND ANAEROBIC Blood Culture adequate volume   Culture   Final    NO GROWTH 5 DAYS Performed at [facility], [address], [place], [place] [address]    Report Status 11/23/2018 FINAL  Final  Culture, blood (routine x 2)     Status: None   Collection Time: 11/18/18  5:53 PM   Specimen: BLOOD RIGHT HAND  Result Value Ref Range Status   Specimen Description BLOOD RIGHT HAND  Final   Special Requests   Final    BOTTLES  DRAWN AEROBIC AND ANAEROBIC Blood Culture adequate volume   Culture   Final    NO GROWTH 5 DAYS Performed at [facility], [place] [address], [place], [place] [address]    Report Status 11/23/2018 FINAL  Final  Culture, Urine     Status: Abnormal   Collection Time: 11/20/18 11:02 AM   Specimen: Urine, Catheterized  Result Value Ref Range Status   Specimen Description URINE, CATHETERIZED  Final   Special Requests NONE  Final   Culture (Aristide Waggle)  Final    <10,000 COLONIES/mL INSIGNIFICANT  GROWTH Performed at [facility], [place] [address] [place], [place] [address]    Report Status 11/21/2018 FINAL  Final         Radiology Studies: No results found.      Scheduled Meds:  sodium chloride   Intravenous Once   amLODipine  5 mg Oral Daily   calcium-vitamin D  1 tablet Oral Q breakfast   Chlorhexidine Gluconate Cloth  6 each Topical Daily   feeding supplement (OSMOLITE 1.5 CAL)  1,000 mL Per Tube Q24H   feeding supplement (PRO-STAT SUGAR FREE 64)  30 mL Per Tube TID   FLUoxetine  10 mg Oral Daily   levothyroxine  150 mcg Oral Q0600   mouth rinse  15 mL Mouth Rinse BID   metoCLOPramide (REGLAN) injection  10 mg Intravenous Q6H   metoprolol tartrate  25 mg Oral BID   montelukast  10 mg Oral QHS   multivitamin with minerals  1 tablet Oral Daily   pantoprazole sodium  40 mg Per Tube Daily   polyethylene glycol  17 g Per Tube Q M,W,F   sodium chloride flush  3 mL Intravenous Q12H   tamsulosin  0.4 mg Oral Daily   Continuous Infusions:  sodium chloride 10 mL/hr at 11/16/18 1500     LOS: 55 days    Time spent: over 30 min    Fayrene Helper, MD [facility] Pager AMION  If 7PM-7AM, please contact night-coverage [email or web address] Password [email or web address] 11/25/2018, 1:41 PM

## 2018-11-26 LAB — COMPREHENSIVE METABOLIC PANEL
ALT: 46 U/L — ABNORMAL HIGH (ref 0–44)
AST: 51 U/L — ABNORMAL HIGH (ref 15–41)
Albumin: 1.9 g/dL — ABNORMAL LOW (ref 3.5–5.0)
Alkaline Phosphatase: 427 U/L — ABNORMAL HIGH (ref 38–126)
Anion gap: 9 (ref 5–15)
BUN: 102 mg/dL — ABNORMAL HIGH (ref 6–20)
CO2: 30 mmol/L (ref 22–32)
Calcium: 8.6 mg/dL — ABNORMAL LOW (ref 8.9–10.3)
Chloride: 105 mmol/L (ref 98–111)
Creatinine, Ser: 2.92 mg/dL — ABNORMAL HIGH (ref 0.44–1.00)
GFR calc Af Amer: 20 mL/min — ABNORMAL LOW (ref >60)
GFR calc non Af Amer: 17 mL/min — ABNORMAL LOW (ref >60)
Glucose, Bld: 146 mg/dL — ABNORMAL HIGH (ref 70–99)
Potassium: 4.7 mmol/L (ref 3.5–5.1)
Sodium: 144 mmol/L (ref 135–145)
Total Bilirubin: 0.7 mg/dL (ref 0.3–1.2)
Total Protein: 5.5 g/dL — ABNORMAL LOW (ref 6.5–8.1)

## 2018-11-26 LAB — PROTIME-INR
INR: 1.2 (ref 0.8–1.2)
Prothrombin Time: 15.2 seconds (ref 11.4–15.2)

## 2018-11-26 LAB — CBC
HCT: 28.1 % — ABNORMAL LOW (ref 36.0–46.0)
Hemoglobin: 8.4 g/dL — ABNORMAL LOW (ref 12.0–15.0)
MCH: 29.9 pg (ref 26.0–34.0)
MCHC: 29.9 g/dL — ABNORMAL LOW (ref 30.0–36.0)
MCV: 100 fL (ref 80.0–100.0)
Platelets: 97 10*3/uL — ABNORMAL LOW (ref 150–400)
RBC: 2.81 MIL/uL — ABNORMAL LOW (ref 3.87–5.11)
RDW: 15.8 % — ABNORMAL HIGH (ref 11.5–15.5)
WBC: 11.8 10*3/uL — ABNORMAL HIGH (ref 4.0–10.5)
nRBC: 0.3 % — ABNORMAL HIGH (ref 0.0–0.2)

## 2018-11-26 LAB — GLUCOSE, CAPILLARY
Glucose-Capillary: 143 mg/dL — ABNORMAL HIGH (ref 70–99)
Glucose-Capillary: 110 mg/dL — ABNORMAL HIGH (ref 70–99)
Glucose-Capillary: 115 mg/dL — ABNORMAL HIGH (ref 70–99)

## 2018-11-26 LAB — MAGNESIUM: Magnesium: 2.6 mg/dL — ABNORMAL HIGH (ref 1.7–2.4)

## 2018-11-26 MED ORDER — AMLODIPINE BESYLATE 10 MG PO TABS
10.0000 mg | ORAL_TABLET | Freq: Every day | ORAL | Status: DC
  Administered 2018-11-27 – 2018-11-29 (×3): 10 mg via ORAL
  Filled 2018-11-26 (×4): qty 1

## 2018-11-26 NOTE — Progress Notes (Signed)
PROGRESS NOTE    [name]  VOH:[id number] DOB: 01-26-1961 DOA: 10/01/2018 PCP: Jani Gravel, MD   Brief Narrative:  Audrey Collier is Audrey Collier 58 year old female with past medical history significant of cognitive delay, anxiety, hypothyroidism, and post menopausal bleeding (worked up 5 years ago), who presented on 8/1 with altered mental status from group home with fever up to 103.8 F and emesis.  Chest x-ray showed bilateral pneumonia and patient was noted to be COVID-19 negative x2.  Treated initially with Rocephin and azithromycin for suspected community-acquired pneumonia.  Blood cultures grew out MSSA and patient was switched to Ancef with rifampin added per ID.  Patient required intubation on 8/4, due to respiratory failure and care was taken over by PCCM.  On 8/6 patient underwent TEE showing large aortic vegetation.  CT surgery was consulted, and she was taken for aortic valve replacement on 10/07/2018.  Postop patient noted to have renal insufficiency and antibiotics were adjusted.  Foley catheters had to be placed due to urinary retention.  She had continued to complain of intermittent abdominal pain and nausea. Overall patient's oral intake had been minimal and therefore core track was placed with dietary consultation for tube feeds.  GI was consulted and performed EGD which did not reveal any acute cause for patient's symptoms on 9/3.  General surgery was consulted due to mildly distended gallbladder concerning for cholecystitis, but HIDA scan was negative.  Due to lack of oral intake she underwent percutaneous gastric tube placement by IR on 9/4.  Nephrology consulted after creatinine elevated to 3.48. They were concerned for likely AIN vs post strep glomerulonephritis.  On 9/7 patient developed projectile vomiting and hemoglobin dropped down to 6.8 requiring transfusion of PRBCs.  Required transfer back into the ICU for close monitoring and palliative care was consulted at that time where family made  patient DNR/DNI.  Patient completed antibiotics for endocarditis on 9/17.  Patient developed low-grade fever on 9/18 up to 100.7 F.  Urinalysis concerning for infection, but urine culture inconclusive.  Labs on 9/22 show alkaline phosphatase 535, albumin 1.8, AST 433, ALT 77, and total bilirubin 0.7.  TRH called help with overall medical management.  Assessment & Plan:   Principal Problem:   Staphylococcus aureus bacteremia with sepsis ([place]) Active Problems:   Severe sepsis ([place])   Bacteremia due to methicillin susceptible Staphylococcus aureus (MSSA)   Aortic valve endocarditis   Acute respiratory failure with hypoxia ([name])   S/P AVR (aortic valve replacement)   Pressure injury of skin   Palliative care by specialist  Severe hypoalbuminemia with anasarca: Patient still with poor appetite. Albumin levels around 1.8.  Suspect patient likely third spacing due to poor nutritional status. -Continue strict intake and output  -Continue tube feeds per dietary  Acute kidney injury   Mild Hypernatremia: Patient with Jackeline Gutknecht creatinine staying around 3 with decreased overall urine output.  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  Unable to have renal biopsy on 9/22 due to nausea and vomiting.   Renal biopsy obtained on 9/23. 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 Antibiotics completed 9/17.   Elevated Liver enzymes:  AST mildly elevated on 9/20 to 61, alk phos was 334 with normal bilirubin.  9/22 rose to AST  433 and alk phos 535 with ALT 77.  Downtrending since then.   Unclear cause at this time.  Per GI c/s note from 9/23, suspected drug induced liver injury (concern for rifampin). Continuing to improve Crestor has been discontinued     Last abdominal ultrasound from 9/1 noted mildly distended gallbladder with 15 mm gallstone and small amount of sludge.  HIDA scan was negative for signs of acute cholecystitis at that time.    Hypothyroidism: Last TSH noted to be 19.826 on 10/30/2018. -Recheck TSH(8.35 appears to be trending down appropriately) -Continue levothyroxine  Anemia   throbocytopenia: Stable.  Hemoglobin 8.4 and platelets fluctuating.  Lower today. -Continue to monitor - stable today  Leukocytosis: mild, stable, continue to monitor  Cognitive delay: Stable.  Patient [facility] in [facility] previous  Nausea   Vomiting: this has been intermittent.  Continue reglan.   DVT prophylaxis: SCD Code Status: DNR Family Communication:none at bedside Disposition Plan: per primary   Consultants:  CT surgery is primary  Nephrology  ID  General Surgery  Cards  PCCM  IR  Palliative Care  Procedures:   TEE 8/6  s/p TAVR 8/7 by Dr. Kipp Brood  s/p PEG tube  Renal biopsy 9/23    Upper Endoscopy Impression - Normal esophagus. - Erythematous mucosa in the antrum. Biopsied. - Small hiatal hernia. - Normal examined duodenum. - The examination was otherwise normal. No source for her symptoms identicified endoscopically. Await gastric biopsy results. - Return patient to hospital ward for ongoing care. - Resume previous diet today. - Continue present medications. Continue to hold Lovenox in the event that gastrostomy tube placement in IR is planned for later today. - Await pathology results from the gastric biopsies.  Antimicrobials:  Anti-infectives (From admission, onward)   Start     Dose/Rate Route Frequency Ordered Stop   11/10/18 2200  ceFAZolin (ANCEF) IVPB  2g/100 mL premix     2 g 200 mL/hr over 30 Minutes Intravenous Every 12 hours 11/10/18 1609 11/17/18 2359   11/09/18 1600  vancomycin (VANCOCIN) IVPB 1000 mg/200 mL premix  Status:  Discontinued     1,000 mg 200 mL/hr over 60 Minutes Intravenous Every 48 hours 11/07/18 1550 11/10/18 1604   11/07/18 1600  vancomycin (VANCOCIN) 2,000 mg in sodium chloride 0.9 % 500 mL IVPB     2,000 mg 250 mL/hr over 120 Minutes Intravenous  Once 11/07/18 1550 11/07/18 1931   11/07/18 1400  piperacillin-tazobactam (ZOSYN) IVPB 3.375 g  Status:  Discontinued     3.375 g 12.5 mL/hr over 240 Minutes Intravenous Every 8 hours 11/07/18 1147 11/10/18 1604   11/07/18 0830  rifampin (RIFADIN) capsule 300 mg  Status:  Discontinued     300 mg Oral Every 8 hours 11/07/18 0817 11/12/18 1231   10/20/18 0000  ceFAZolin (ANCEF) IVPB     2 g Intravenous Every 8 hours 10/20/18 1001 11/25/18 2359   10/14/18 0600  rifampin (RIFADIN) capsule 300 mg  Status:  Discontinued     300 mg Oral Every 8 hours 10/14/18 0456 11/07/18 0817   10/12/18 1200  ceFAZolin (ANCEF) IVPB 2g/100 mL premix  Status:  Discontinued     2 g 200 mL/hr over 30 Minutes Intravenous Every 8 hours 10/12/18 1053 11/07/18 1550   10/10/18  1300  ceFAZolin (ANCEF) IVPB 2g/100 mL premix  Status:  Discontinued     2 g 200 mL/hr over 30 Minutes Intravenous Every 12 hours 10/10/18 1147 10/12/18 1053   10/08/18 1400  rifampin (RIFADIN) 60 mg/mL oral suspension 300 mg  Status:  Discontinued     300 mg Oral Every 8 hours 10/08/18 0839 10/14/18 0456   10/07/18 2200  rifampin (RIFADIN) 60 mg/mL oral suspension 300 mg  Status:  Discontinued     300 mg Per Tube Every 8 hours 10/07/18 1714 10/08/18 0839   10/07/18 2000  vancomycin (VANCOCIN) IVPB 1000 mg/200 mL premix     1,000 mg 200 mL/hr over 60 Minutes Intravenous  Once 10/07/18 1336 10/07/18 2158   10/07/18 0430  cefUROXime (ZINACEF) 750 mg in sodium chloride 0.9 % 100 mL IVPB  Status:  Discontinued     750 mg 200  mL/hr over 30 Minutes Intravenous To Surgery 10/06/18 1531 10/07/18 1248   10/07/18 0400  vancomycin (VANCOCIN) 1,000 mg in sodium chloride 0.9 % 1,000 mL irrigation      Irrigation To Surgery 10/06/18 1415 10/07/18 0932   10/07/18 0400  vancomycin (VANCOCIN) 1,250 mg in sodium chloride 0.9 % 250 mL IVPB     1,250 mg 166.7 mL/hr over 90 Minutes Intravenous To Surgery 10/06/18 1415 10/07/18 0800   10/07/18 0400  cefUROXime (ZINACEF) 1.5 g in sodium chloride 0.9 % 100 mL IVPB     1.5 g 200 mL/hr over 30 Minutes Intravenous To Surgery 10/06/18 1415 10/07/18 1129   10/06/18 1430  cefUROXime (ZINACEF) 750 mg in sodium chloride 0.9 % 100 mL IVPB  Status:  Discontinued     750 mg 200 mL/hr over 30 Minutes Intravenous To Surgery 10/06/18 1415 10/06/18 1532   10/05/18 1300  nafcillin 2 g in sodium chloride 0.9 % 100 mL IVPB  Status:  Discontinued     2 g 200 mL/hr over 30 Minutes Intravenous Every 4 hours 10/05/18 1109 10/10/18 1147   10/02/18 1200  ceFAZolin (ANCEF) IVPB 2g/100 mL premix  Status:  Discontinued     2 g 200 mL/hr over 30 Minutes Intravenous Every 8 hours 10/02/18 1040 10/05/18 1109   10/01/18 2000  cefTRIAXone (ROCEPHIN) 2 g in sodium chloride 0.9 % 100 mL IVPB  Status:  Discontinued     2 g 200 mL/hr over 30 Minutes Intravenous Every 24 hours 10/01/18 1958 10/02/18 1040   10/01/18 2000  azithromycin (ZITHROMAX) 500 mg in sodium chloride 0.9 % 250 mL IVPB  Status:  Discontinued     500 mg 250 mL/hr over 60 Minutes Intravenous Every 24 hours 10/01/18 1958 10/02/18 1040      Subjective: Denies complaints Asking to be changed (she soiled herself) - discussed with nurisng  Objective: Vitals:   11/26/18 0400 11/26/18 0800 11/26/18 1000 11/26/18 1200  BP: 139/79 139/77 (!) 152/82 131/75  Pulse: 74 76 81   Resp: 19 19 (!) 23 20  Temp: 98.1 F (36.7 C) 98.1 F (36.7 C)  97.9 F (36.6 C)  TempSrc: Oral Oral  Oral  SpO2: 99% 96% 96% 95%  Weight: 90.1 kg     Height:         Intake/Output Summary (Last 24 hours) at 11/26/2018 1619 Last data filed at 11/26/2018 0901 Gross per 24 hour  Intake 0 ml  Output 150 ml  Net -150 ml   Filed Weights   11/24/18 0333 11/25/18 0400 11/26/18 0400  Weight: 88.3 kg 87.3 kg 90.1  kg    Examination:  General: No acute distress. Cardiovascular: Heart sounds show Daleen Steinhaus regular rate, and rhythm.  Lungs: Clear to auscultation bilaterally  Abdomen: Soft, nontender, nondistended  Neurological: Alert. Moves all extremities 4. Cranial nerves II through XII grossly intact. Skin: Warm and dry. No rashes or lesions. Extremities: bilateral LE edema      Data Reviewed: I have personally reviewed following labs and imaging studies  CBC: Recent Labs  Lab 11/22/18 0500 11/23/18 0441 11/24/18 0423 11/25/18 0431 11/26/18 0523  WBC 11.1* 9.7 10.7* 11.2* 11.8*  HGB 7.9* 8.1* 8.1* 8.3* 8.4*  HCT 26.9* 26.8* 27.2* 28.7* 28.1*  MCV 99.3 100.0 99.3 101.4* 100.0  PLT 144* 127* 126* 121* 97*   Basic Metabolic Panel: Recent Labs  Lab 11/22/18 0500 11/23/18 0441 11/24/18 0423 11/25/18 0431 11/26/18 0523  NA 143 143 144 146* 144  K 4.2 4.4 4.4 4.8 4.7  CL 104 105 104 105 105  CO2 28 28 30 31 30   GLUCOSE 135* 112* 125* 135* 146*  BUN 104* 110* 109* 107* 102*  CREATININE 3.20* 3.22* 3.16* 3.23* 2.92*  CALCIUM 9.2 9.2 9.1 8.8* 8.6*  MG  --   --   --   --  2.6*   GFR: Estimated Creatinine Clearance: 22.2 mL/min (Maday Guarino) (by C-G formula based on SCr of 2.92 mg/dL (H)). Liver Function Tests: Recent Labs  Lab 11/22/18 0500 11/23/18 0441 11/24/18 0423 11/25/18 0431 11/26/18 0523  AST 433* 242* 194* 91* 51*  ALT 77* 79* 79* 64* 46*  ALKPHOS 535* 530* 520* 497* 427*  BILITOT 0.7 0.7 0.9 0.6 0.7  PROT 5.5* 5.5* 5.6* 5.5* 5.5*  ALBUMIN 1.8* 1.8* 1.8* 2.0* 1.9*   No results for input(s): LIPASE, AMYLASE in the last 168 hours. No results for input(s): AMMONIA in the last 168 hours. Coagulation Profile: Recent Labs  Lab  11/22/18 0908 11/26/18 0523  INR 1.5* 1.2   Cardiac Enzymes: No results for input(s): CKTOTAL, CKMB, CKMBINDEX, TROPONINI in the last 168 hours. BNP (last 3 results) No results for input(s): PROBNP in the last 8760 hours. HbA1C: No results for input(s): HGBA1C in the last 72 hours. CBG: Recent Labs  Lab 11/25/18 0755 11/25/18 1631 11/25/18 2004 11/26/18 0404 11/26/18 1119  GLUCAP 141* 109* 127* 143* 110*   Lipid Profile: No results for input(s): CHOL, HDL, LDLCALC, TRIG, CHOLHDL, LDLDIRECT in the last 72 hours. Thyroid Function Tests: Recent Labs    11/24/18 0924  FREET4 0.92   Anemia Panel: No results for input(s): VITAMINB12, FOLATE, FERRITIN, TIBC, IRON, RETICCTPCT in the last 72 hours. Sepsis Labs: No results for input(s): PROCALCITON, LATICACIDVEN in the last 168 hours.  Recent Results (from the past 240 hour(s))  Culture, blood (routine x 2)     Status: None   Collection Time: 11/18/18  5:47 PM   Specimen: BLOOD  Result Value Ref Range Status   Specimen Description BLOOD RIGHT ANTECUBITAL  Final   Special Requests   Final    BOTTLES DRAWN AEROBIC AND ANAEROBIC Blood Culture adequate volume   Culture   Final    NO GROWTH 5 DAYS Performed at [facility], [address], [place], [place] [address]    Report Status 11/23/2018 FINAL  Final  Culture, blood (routine x 2)     Status: None   Collection Time: 11/18/18  5:53 PM   Specimen: BLOOD RIGHT HAND  Result Value Ref Range Status   Specimen Description BLOOD RIGHT HAND  Final  Special Requests   Final    BOTTLES DRAWN AEROBIC AND ANAEROBIC Blood Culture adequate volume   Culture   Final    NO GROWTH 5 DAYS Performed at [facility], [place] [address], [place], [place] [address]    Report Status 11/23/2018 FINAL  Final  Culture, Urine     Status: Abnormal   Collection Time: 11/20/18 11:02 AM   Specimen: Urine, Catheterized  Result Value Ref Range Status   Specimen Description URINE,  CATHETERIZED  Final   Special Requests NONE  Final   Culture ([place])  Final    <10,000 COLONIES/mL INSIGNIFICANT GROWTH Performed at [facility], [place] [address], [place], [place] [address]    Report Status 11/21/2018 FINAL  Final         Radiology Studies: No results found.      Scheduled Meds:  sodium chloride   Intravenous Once   [START ON 11/27/2018] amLODipine  10 mg Oral Daily   calcium-vitamin D  1 tablet Oral Q breakfast   Chlorhexidine Gluconate Cloth  6 each Topical Daily   feeding supplement (OSMOLITE 1.5 CAL)  1,000 mL Per Tube Q24H   feeding supplement (PRO-STAT SUGAR FREE 64)  30 mL Per Tube TID   FLUoxetine  10 mg Oral Daily   levothyroxine  150 mcg Oral Q0600   mouth rinse  15 mL Mouth Rinse BID   metoCLOPramide (REGLAN) injection  10 mg Intravenous Q6H   metoprolol tartrate  25 mg Oral BID   montelukast  10 mg Oral QHS   multivitamin with minerals  1 tablet Oral Daily   pantoprazole sodium  40 mg Per Tube Daily   polyethylene glycol  17 g Per Tube Q M,W,F   sodium chloride flush  3 mL Intravenous Q12H   tamsulosin  0.4 mg Oral Daily   Continuous Infusions:  sodium chloride 10 mL/hr at 11/16/18 1500     LOS: 56 days    Time spent: over 30 min    Fayrene Helper, MD [facility] Pager AMION  If 7PM-7AM, please contact night-coverage [email or web address] Password [facility] 11/26/2018, 4:19 PM

## 2018-11-26 NOTE — Progress Notes (Addendum)
[address]       [place] [address]             [phone]      23 Days Post-Op Procedure(s) (LRB): ESOPHAGOGASTRODUODENOSCOPY (EGD) WITH PROPOFOL (N/A) BIOPSY Subjective: C/O some discomfort in belly  Objective: Vital signs in last 24 hours: Temp:  [97.4 F (36.3 C)-98.7 F (37.1 C)] 98.1 F (36.7 C) (09/26 0800) Pulse Rate:  [26-83] 76 (09/26 0800) Cardiac Rhythm: Normal sinus rhythm (09/26 0700) Resp:  [19-25] 19 (09/26 0800) BP: (139-163)/(77-90) 139/77 (09/26 0800) SpO2:  [95 %-100 %] 96 % (09/26 0800) Weight:  [90.1 kg] 90.1 kg (09/26 0400)  Hemodynamic parameters for last 24 hours:    Intake/Output from previous day: 09/25 0701 - 09/26 0700 In: 1364.5 [NG/GT:1364.5] Out: 200 [Urine:200] Intake/Output this shift: Total I/O In: 0  Out: 150 [Urine:150]  General appearance: alert, distracted, fatigued and no distress Heart: regular rate and rhythm and + systolic murmur Lungs: somewhat dim in bases Abdomen: soft, + BS, ? tender Extremities: + edema Wound: incis healing well  Lab Results: Recent Labs    11/25/18 0431 11/26/18 0523  WBC 11.2* 11.8*  HGB 8.3* 8.4*  HCT 28.7* 28.1*  PLT 121* 97*   BMET:  Recent Labs    11/25/18 0431 11/26/18 0523  NA 146* 144  K 4.8 4.7  CL 105 105  CO2 31 30  GLUCOSE 135* 146*  BUN 107* 102*  CREATININE 3.23* 2.92*  CALCIUM 8.8* 8.6*    PT/INR:  Recent Labs    11/26/18 0523  LABPROT 15.2  INR 1.2   ABG    Component Value Date/Time   PHART 7.430 10/08/2018 0920   HCO3 23.6 10/08/2018 0920   TCO2 25 10/08/2018 0920   ACIDBASEDEF 6.0 (H) 10/08/2018 0307   O2SAT 94.0 10/08/2018 0920   CBG (last 3)  Recent Labs    11/25/18 1631 11/25/18 2004 11/26/18 0404  GLUCAP 109* 127* 143*    Meds Scheduled Meds: . sodium chloride   Intravenous Once  . amLODipine  5 mg Oral Daily  . calcium-vitamin D  1 tablet Oral Q breakfast  . Chlorhexidine Gluconate Cloth  6 each Topical  Daily  . feeding supplement (OSMOLITE 1.5 CAL)  1,000 mL Per Tube Q24H  . feeding supplement (PRO-STAT SUGAR FREE 64)  30 mL Per Tube TID  . FLUoxetine  10 mg Oral Daily  . levothyroxine  150 mcg Oral Q0600  . mouth rinse  15 mL Mouth Rinse BID  . metoCLOPramide (REGLAN) injection  10 mg Intravenous Q6H  . metoprolol tartrate  25 mg Oral BID  . montelukast  10 mg Oral QHS  . multivitamin with minerals  1 tablet Oral Daily  . pantoprazole sodium  40 mg Per Tube Daily  . polyethylene glycol  17 g Per Tube Q M,W,F  . sodium chloride flush  3 mL Intravenous Q12H  . tamsulosin  0.4 mg Oral Daily   Continuous Infusions: . sodium chloride 10 mL/hr at 11/16/18 1500   PRN Meds:.sodium chloride, acetaminophen, bisacodyl **OR** bisacodyl, magic mouthwash, metoprolol tartrate, morphine injection, ondansetron (ZOFRAN) IV, oxyCODONE, sodium chloride flush, traMADol  Xrays No results found.  Assessment/Plan: S/P Procedure(s) (LRB): ESOPHAGOGASTRODUODENOSCOPY (EGD) WITH PROPOFOL (N/A) BIOPSY   1 hemodyn stable in sinus rhythm, some HTN, will increase amlodipine to 10 mg 2 sats ok on 2 liters [place] 3 making urine, creat improved- nephrology has now signed off rec f/u in  1 month. Anasarca mediated in part to poor nutrition status. Has received intermit lasix at times. Conts TF at night 4 appreciate medicine service assisting with management 5 CBC is stable, platelets have dropped some- monitor clinically 6 BS adeq controlled 7 prob SNF at d/c  LOS: 56 days    Audrey Collier 11/26/2018  Agree with above dispo planning

## 2018-11-26 NOTE — Progress Notes (Signed)
[facility] Progress Note    Assessment/ Plan:   1. Acute kidney injury. Remains nonoliguric. Cr in the mid 3s. 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 Serologies and complements negative. Discussed with aunt [name] that biopsy may be warranted-->she is in agreement. UP/C 6.5, no history of DM. Sending A1c-->4.9 on 9/19.Crfluctuatingand stable.  -Appreciaterenalbxby Dr. Neldon Labella a definitive diagnosison 9/23->  - Fortunately no acute indication for dialysis; renal function appears to be stabilizing.  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 By her history it's from endocarditis and not much from the renal standpoint to do at this time. No immunomodulatory therapies.  Will sign off at this time; upon d/c will need f/u with [facility] in a month.  2. 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 Completed antibiotics  3. Severe hypoalbuminemia. Continue enteral nutrition as able, getting meds for constipation-> BM this AM.  4. Cognitive delay.  5. Anemia. Hemoglobin stable for now  6. Hyperkalemia. Lokelma prn.  7. Anasarca: 40 IV Lasix 9/18 and 9/19. Alb low so expect continued third spacing and don't expect it to completely resolve.  - Appears overloaded   8. Leukocytosis/ low-grade fever   Subjective:   Doesn't appeardyspneic.   Objective:   BP 139/79 (BP Location: Right Arm)   Pulse 74   Temp 98.1 F (36.7 C) (Oral)   Resp 19   Ht 5\' 2"  (1.575 m)   Wt 90.1 kg   LMP 04/16/2013 (Approximate)   SpO2 99%   BMI 36.33 kg/m   Intake/Output Summary (Last 24 hours) at 11/26/2018 0727 Last data filed at 11/25/2018 1506 Gross per 24 hour  Intake 1364.5 ml  Output 200 ml  Net 1164.5 ml   Weight change: 2.8 kg  Physical Exam: General:Awake, pleasant,  tachypneic HEENT: some red rash on cheeks L > R Neck: + JVD, no adenopathy CV: Heart RRR  Lungs:Rales bibasilar Abd: abd slightly distended, nontender with normal BS Extremities: +3 upper and lower extremity edema Skin: erythematous rash on cheeks- doesn't cross nasolabial folds  Imaging: No results found.  Labs: BMET Recent Labs  Lab 11/20/18 1433 11/22/18 0500 11/23/18 0441 11/24/18 0423 11/25/18 0431 11/26/18 0523  NA 143 143 143 144 146* 144  K 4.4 4.2 4.4 4.4 4.8 4.7  CL 104 104 105 104 105 105  CO2 28 28 28 30 31 30   GLUCOSE 118* 135* 112* 125* 135* 146*  BUN 95* 104* 110* 109* 107* 102*  CREATININE 3.17* 3.20* 3.22* 3.16* 3.23* 2.92*  CALCIUM 9.8 9.2 9.2 9.1 8.8* 8.6*   CBC Recent Labs  Lab 11/23/18 0441 11/24/18 0423 11/25/18 0431 11/26/18 0523  WBC 9.7 10.7* 11.2* 11.8*  HGB 8.1* 8.1* 8.3* 8.4*  HCT 26.8* 27.2* 28.7* 28.1*  MCV 100.0 99.3 101.4* 100.0  PLT 127* 126* 121* 97*    Medications:    . sodium chloride   Intravenous Once  . amLODipine  5 mg Oral Daily  . calcium-vitamin D  1 tablet Oral Q breakfast  . Chlorhexidine Gluconate Cloth  6 each Topical Daily  . feeding supplement (OSMOLITE 1.5 CAL)  1,000 mL Per Tube Q24H  . feeding supplement (PRO-STAT SUGAR FREE 64)  30 mL Per Tube TID  . FLUoxetine  10 mg Oral Daily  . levothyroxine  150 mcg Oral Q0600  . mouth  rinse  15 mL Mouth Rinse BID  . metoCLOPramide (REGLAN) injection  10 mg Intravenous Q6H  . metoprolol tartrate  25 mg Oral BID  . montelukast  10 mg Oral QHS  . multivitamin with minerals  1 tablet Oral Daily  . pantoprazole sodium  40 mg Per Tube Daily  . polyethylene glycol  17 g Per Tube Q M,W,F  . sodium chloride flush  3 mL Intravenous Q12H  . tamsulosin  0.4 mg Oral Daily      Paulene Floor, MD 11/26/2018, 7:27 AM

## 2018-11-27 LAB — GLUCOSE, CAPILLARY
Glucose-Capillary: 113 mg/dL — ABNORMAL HIGH (ref 70–99)
Glucose-Capillary: 143 mg/dL — ABNORMAL HIGH (ref 70–99)
Glucose-Capillary: 103 mg/dL — ABNORMAL HIGH (ref 70–99)
Glucose-Capillary: 128 mg/dL — ABNORMAL HIGH (ref 70–99)

## 2018-11-27 LAB — COMPREHENSIVE METABOLIC PANEL
ALT: 36 U/L (ref 0–44)
AST: 37 U/L (ref 15–41)
Albumin: 1.9 g/dL — ABNORMAL LOW (ref 3.5–5.0)
Alkaline Phosphatase: 352 U/L — ABNORMAL HIGH (ref 38–126)
Anion gap: 9 (ref 5–15)
BUN: 101 mg/dL — ABNORMAL HIGH (ref 6–20)
CO2: 31 mmol/L (ref 22–32)
Calcium: 8.7 mg/dL — ABNORMAL LOW (ref 8.9–10.3)
Chloride: 104 mmol/L (ref 98–111)
Creatinine, Ser: 2.83 mg/dL — ABNORMAL HIGH (ref 0.44–1.00)
GFR calc Af Amer: 21 mL/min — ABNORMAL LOW (ref >60)
GFR calc non Af Amer: 18 mL/min — ABNORMAL LOW (ref >60)
Glucose, Bld: 124 mg/dL — ABNORMAL HIGH (ref 70–99)
Potassium: 4.7 mmol/L (ref 3.5–5.1)
Sodium: 144 mmol/L (ref 135–145)
Total Bilirubin: 0.7 mg/dL (ref 0.3–1.2)
Total Protein: 5.2 g/dL — ABNORMAL LOW (ref 6.5–8.1)

## 2018-11-27 LAB — CBC
HCT: 27.5 % — ABNORMAL LOW (ref 36.0–46.0)
Hemoglobin: 8.3 g/dL — ABNORMAL LOW (ref 12.0–15.0)
MCH: 30.3 pg (ref 26.0–34.0)
MCHC: 30.2 g/dL (ref 30.0–36.0)
MCV: 100.4 fL — ABNORMAL HIGH (ref 80.0–100.0)
Platelets: 95 10*3/uL — ABNORMAL LOW (ref 150–400)
RBC: 2.74 MIL/uL — ABNORMAL LOW (ref 3.87–5.11)
RDW: 16 % — ABNORMAL HIGH (ref 11.5–15.5)
WBC: 11.6 10*3/uL — ABNORMAL HIGH (ref 4.0–10.5)
nRBC: 0.3 % — ABNORMAL HIGH (ref 0.0–0.2)

## 2018-11-27 LAB — MAGNESIUM: Magnesium: 2.5 mg/dL — ABNORMAL HIGH (ref 1.7–2.4)

## 2018-11-27 NOTE — Progress Notes (Addendum)
[address]       [place] [address]             [phone]      24 Days Post-Op Procedure(s) (LRB): ESOPHAGOGASTRODUODENOSCOPY (EGD) WITH PROPOFOL (N/A) BIOPSY Subjective: Only c/o is belly is sore  Objective: Vital signs in last 24 hours: Temp:  [97.9 F (36.6 C)-98.8 F (37.1 C)] 98.5 F (36.9 C) (09/26 2328) Pulse Rate:  [79-86] 79 (09/26 2328) Cardiac Rhythm: Normal sinus rhythm (09/26 1925) Resp:  [20-26] 26 (09/27 1000) BP: (131-152)/(73-99) 138/98 (09/27 1000) SpO2:  [92 %-96 %] 92 % (09/27 1000) Weight:  [89.9 kg] 89.9 kg (09/27 0500)  Hemodynamic parameters for last 24 hours:    Intake/Output from previous day: 09/26 0701 - 09/27 0700 In: 193 [P.O.:180; I.V.:13] Out: 850 [Urine:850] Intake/Output this shift: Total I/O In: -  Out: 800 [Urine:800]  General appearance: alert, distracted, no distress and slowed mentation Heart: regular rate and rhythm Lungs: dim in lower fields Abdomen: mild tenderness, non-specific Extremities: no edema Wound: incis healed well  Lab Results: Recent Labs    11/26/18 0523 11/27/18 0501  WBC 11.8* 11.6*  HGB 8.4* 8.3*  HCT 28.1* 27.5*  PLT 97* 95*   BMET:  Recent Labs    11/26/18 0523 11/27/18 0501  NA 144 144  K 4.7 4.7  CL 105 104  CO2 30 31  GLUCOSE 146* 124*  BUN 102* 101*  CREATININE 2.92* 2.83*  CALCIUM 8.6* 8.7*    PT/INR:  Recent Labs    11/26/18 0523  LABPROT 15.2  INR 1.2   ABG    Component Value Date/Time   PHART 7.430 10/08/2018 0920   HCO3 23.6 10/08/2018 0920   TCO2 25 10/08/2018 0920   ACIDBASEDEF 6.0 (H) 10/08/2018 0307   O2SAT 94.0 10/08/2018 0920   CBG (last 3)  Recent Labs    11/26/18 1119 11/26/18 2005 11/27/18 0508  GLUCAP 110* 115* 113*    Meds Scheduled Meds: . sodium chloride   Intravenous Once  . amLODipine  10 mg Oral Daily  . calcium-vitamin D  1 tablet Oral Q breakfast  . Chlorhexidine Gluconate Cloth  6 each Topical Daily  .  feeding supplement (OSMOLITE 1.5 CAL)  1,000 mL Per Tube Q24H  . feeding supplement (PRO-STAT SUGAR FREE 64)  30 mL Per Tube TID  . FLUoxetine  10 mg Oral Daily  . levothyroxine  150 mcg Oral Q0600  . mouth rinse  15 mL Mouth Rinse BID  . metoCLOPramide (REGLAN) injection  10 mg Intravenous Q6H  . metoprolol tartrate  25 mg Oral BID  . montelukast  10 mg Oral QHS  . multivitamin with minerals  1 tablet Oral Daily  . pantoprazole sodium  40 mg Per Tube Daily  . polyethylene glycol  17 g Per Tube Q M,W,F  . sodium chloride flush  3 mL Intravenous Q12H  . tamsulosin  0.4 mg Oral Daily   Continuous Infusions: . sodium chloride 10 mL/hr at 11/16/18 1500   PRN Meds:.sodium chloride, acetaminophen, bisacodyl **OR** bisacodyl, magic mouthwash, metoprolol tartrate, morphine injection, ondansetron (ZOFRAN) IV, oxyCODONE, sodium chloride flush, traMADol  Xrays No results found.  Assessment/Plan: S/P Procedure(s) (LRB): ESOPHAGOGASTRODUODENOSCOPY (EGD) WITH PROPOFOL (N/A) BIOPSY   1 hemodyn stable in sinus rhythm, no fevers 2 sats ok on 2 liters 3 BUN/CR- slowly improving numbers, conts to make urine 4 appreciate Dr Abel Presto assist with med management 5 SW assisting with bed placement  LOS: 57 days    Rowe Clack [facility] 11/27/2018 Pager [phone]  Agree with above Creat and LFTs improving dispo planning  Kapena Hamme O Quaron Delacruz

## 2018-11-27 NOTE — Progress Notes (Signed)
PROGRESS NOTE    Audrey Collier  XKG:[id number] DOB: 1960-05-27 DOA: 10/01/2018 PCP: Jani Gravel, MD   Brief Narrative:  Audrey Collier is Audrey Collier 58 year old female with past medical history significant of cognitive delay, anxiety, hypothyroidism, and post menopausal bleeding (worked up 5 years ago), who presented on 8/1 with altered mental status from group home with fever up to 103.8 F and emesis.  Chest x-ray showed bilateral pneumonia and patient was noted to be COVID-19 negative x2.  Treated initially with Rocephin and azithromycin for suspected community-acquired pneumonia.  Blood cultures grew out MSSA and patient was switched to Ancef with rifampin added per ID.  Patient required intubation on 8/4, due to respiratory failure and care was taken over by PCCM.  On 8/6 patient underwent TEE showing large aortic vegetation.  CT surgery was consulted, and she was taken for aortic valve replacement on 10/07/2018.  Postop patient noted to have renal insufficiency and antibiotics were adjusted.  Foley catheters had to be placed due to urinary retention.  She had continued to complain of intermittent abdominal pain and nausea. Overall patient's oral intake had been minimal and therefore core track was placed with dietary consultation for tube feeds.  GI was consulted and performed EGD which did not reveal any acute cause for patient's symptoms on 9/3.  General surgery was consulted due to mildly distended gallbladder concerning for cholecystitis, but HIDA scan was negative.  Due to lack of oral intake she underwent percutaneous gastric tube placement by IR on 9/4.  Nephrology consulted after creatinine elevated to 3.48. They were concerned for likely AIN vs post strep glomerulonephritis.  On 9/7 patient developed projectile vomiting and hemoglobin dropped down to 6.8 requiring transfusion of PRBCs.  Required transfer back into the ICU for close monitoring and palliative care was consulted at that time where family made  patient DNR/DNI.  Patient completed antibiotics for endocarditis on 9/17.  Patient developed low-grade fever on 9/18 up to 100.7 F.  Urinalysis concerning for infection, but urine culture inconclusive.  Labs on 9/22 show alkaline phosphatase 535, albumin 1.8, AST 433, ALT 77, and total bilirubin 0.7.  TRH called help with overall medical management.  Assessment & Plan:   Principal Problem:   Staphylococcus aureus bacteremia with sepsis ([place]) Active Problems:   Severe sepsis ([place])   Bacteremia due to methicillin susceptible Staphylococcus aureus (MSSA)   Aortic valve endocarditis   Acute respiratory failure with hypoxia ([name])   S/P AVR (aortic valve replacement)   Pressure injury of skin   Palliative care by specialist  Severe hypoalbuminemia with anasarca: Patient still with poor appetite. Albumin levels around 1.8.  Suspect patient likely third spacing due to poor nutritional status. -Continue strict intake and output  -Continue tube feeds per dietary  Acute kidney injury   Mild Hypernatremia: Patient with Audrey Collier creatinine staying around 3 with decreased overall urine output.  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  Unable to have renal biopsy on 9/22 due to nausea and vomiting.   Renal biopsy obtained on 9/23. 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  Antibiotics completed 9/17.   Elevated Liver enzymes:  AST mildly elevated on 9/20 to 61, alk phos was 334 with normal bilirubin.  9/22 rose to AST 433 and alk phos 535 with  ALT 77.  Downtrending since then.   Unclear cause at this time.  Per GI c/s note from 9/23, suspected drug induced liver injury (concern for rifampin). Continuing to improve Crestor has been discontinued     Last abdominal ultrasound from 9/1 noted mildly distended gallbladder with 15 mm gallstone and small amount of sludge.  HIDA scan was negative for signs of acute cholecystitis at that time.    Hypothyroidism: Last TSH noted to be 19.826 on 10/30/2018. -Recheck TSH(8.35 appears to be trending down appropriately) -Continue levothyroxine  Anemia   throbocytopenia: Stable.  Hemoglobin 8.4 and platelets fluctuating. Relatively stable, follow. -Continue to monitor - stable today  Leukocytosis: mild, stable, continue to monitor  Cognitive delay: Stable.  Patient has been in [facility] previous  Nausea   Vomiting: this has been intermittent.  Continue reglan.   DVT prophylaxis: SCD Code Status: DNR Family Communication:none at bedside Disposition Plan: per primary   Consultants:  CT surgery is primary  Nephrology  ID  General Surgery  Cards  PCCM  IR  Palliative Care  Procedures:   TEE 8/6  s/p TAVR 8/7 by Dr. Kipp Brood  s/p PEG tube  Renal biopsy 9/23    Upper Endoscopy Impression - Normal esophagus. - Erythematous mucosa in the antrum. Biopsied. - Small hiatal hernia. - Normal examined duodenum. - The examination was otherwise normal. No source for her symptoms identicified endoscopically. Await gastric biopsy results. - Return patient to hospital ward for ongoing care. - Resume previous diet today. - Continue present medications. Continue to hold Lovenox in the event that gastrostomy tube placement in IR is planned for later today. - Await pathology results from the gastric biopsies.  Antimicrobials:  Anti-infectives (From admission, onward)   Start     Dose/Rate Route Frequency Ordered Stop   11/10/18 2200  ceFAZolin (ANCEF) IVPB 2g/100 mL  premix     2 g 200 mL/hr over 30 Minutes Intravenous Every 12 hours 11/10/18 1609 11/17/18 2359   11/09/18 1600  vancomycin (VANCOCIN) IVPB 1000 mg/200 mL premix  Status:  Discontinued     1,000 mg 200 mL/hr over 60 Minutes Intravenous Every 48 hours 11/07/18 1550 11/10/18 1604   11/07/18 1600  vancomycin (VANCOCIN) 2,000 mg in sodium chloride 0.9 % 500 mL IVPB     2,000 mg 250 mL/hr over 120 Minutes Intravenous  Once 11/07/18 1550 11/07/18 1931   11/07/18 1400  piperacillin-tazobactam (ZOSYN) IVPB 3.375 g  Status:  Discontinued     3.375 g 12.5 mL/hr over 240 Minutes Intravenous Every 8 hours 11/07/18 1147 11/10/18 1604   11/07/18 0830  rifampin (RIFADIN) capsule 300 mg  Status:  Discontinued     300 mg Oral Every 8 hours 11/07/18 0817 11/12/18 1231   10/20/18 0000  ceFAZolin (ANCEF) IVPB     2 g Intravenous Every 8 hours 10/20/18 1001 11/25/18 2359   10/14/18 0600  rifampin (RIFADIN) capsule 300 mg  Status:  Discontinued     300 mg Oral Every 8 hours 10/14/18 0456 11/07/18 0817   10/12/18 1200  ceFAZolin (ANCEF) IVPB 2g/100 mL premix  Status:  Discontinued     2 g 200 mL/hr over 30 Minutes Intravenous Every 8 hours 10/12/18 1053 11/07/18 1550   10/10/18 1300  ceFAZolin (ANCEF) IVPB 2g/100  mL premix  Status:  Discontinued     2 g 200 mL/hr over 30 Minutes Intravenous Every 12 hours 10/10/18 1147 10/12/18 1053   10/08/18 1400  rifampin (RIFADIN) 60 mg/mL oral suspension 300 mg  Status:  Discontinued     300 mg Oral Every 8 hours 10/08/18 0839 10/14/18 0456   10/07/18 2200  rifampin (RIFADIN) 60 mg/mL oral suspension 300 mg  Status:  Discontinued     300 mg Per Tube Every 8 hours 10/07/18 1714 10/08/18 0839   10/07/18 2000  vancomycin (VANCOCIN) IVPB 1000 mg/200 mL premix     1,000 mg 200 mL/hr over 60 Minutes Intravenous  Once 10/07/18 1336 10/07/18 2158   10/07/18 0430  cefUROXime (ZINACEF) 750 mg in sodium chloride 0.9 % 100 mL IVPB  Status:  Discontinued     750 mg 200 mL/hr over  30 Minutes Intravenous To Surgery 10/06/18 1531 10/07/18 1248   10/07/18 0400  vancomycin (VANCOCIN) 1,000 mg in sodium chloride 0.9 % 1,000 mL irrigation      Irrigation To Surgery 10/06/18 1415 10/07/18 0932   10/07/18 0400  vancomycin (VANCOCIN) 1,250 mg in sodium chloride 0.9 % 250 mL IVPB     1,250 mg 166.7 mL/hr over 90 Minutes Intravenous To Surgery 10/06/18 1415 10/07/18 0800   10/07/18 0400  cefUROXime (ZINACEF) 1.5 g in sodium chloride 0.9 % 100 mL IVPB     1.5 g 200 mL/hr over 30 Minutes Intravenous To Surgery 10/06/18 1415 10/07/18 1129   10/06/18 1430  cefUROXime (ZINACEF) 750 mg in sodium chloride 0.9 % 100 mL IVPB  Status:  Discontinued     750 mg 200 mL/hr over 30 Minutes Intravenous To Surgery 10/06/18 1415 10/06/18 1532   10/05/18 1300  nafcillin 2 g in sodium chloride 0.9 % 100 mL IVPB  Status:  Discontinued     2 g 200 mL/hr over 30 Minutes Intravenous Every 4 hours 10/05/18 1109 10/10/18 1147   10/02/18 1200  ceFAZolin (ANCEF) IVPB 2g/100 mL premix  Status:  Discontinued     2 g 200 mL/hr over 30 Minutes Intravenous Every 8 hours 10/02/18 1040 10/05/18 1109   10/01/18 2000  cefTRIAXone (ROCEPHIN) 2 g in sodium chloride 0.9 % 100 mL IVPB  Status:  Discontinued     2 g 200 mL/hr over 30 Minutes Intravenous Every 24 hours 10/01/18 1958 10/02/18 1040   10/01/18 2000  azithromycin (ZITHROMAX) 500 mg in sodium chloride 0.9 % 250 mL IVPB  Status:  Discontinued     500 mg 250 mL/hr over 60 Minutes Intravenous Every 24 hours 10/01/18 1958 10/02/18 1040      Subjective: Doing ok today Denies complaints.  Objective: Vitals:   11/26/18 2328 11/27/18 0500 11/27/18 1000 11/27/18 1227  BP: (!) 145/99  (!) 138/98 (!) 143/81  Pulse: 79   84  Resp: (!) 24  (!) 26 (!) 28  Temp: 98.5 F (36.9 C)   98.4 F (36.9 C)  TempSrc: Oral   Oral  SpO2: 95%  92% 95%  Weight:  89.9 kg    Height:        Intake/Output Summary (Last 24 hours) at 11/27/2018 1329 Last data filed at  11/27/2018 1001 Gross per 24 hour  Intake 73 ml  Output 1400 ml  Net -1327 ml   Filed Weights   11/25/18 0400 11/26/18 0400 11/27/18 0500  Weight: 87.3 kg 90.1 kg 89.9 kg    Examination:  General: No acute distress. Cardiovascular: Heart sounds  show Dain Laseter regular rate, and rhythm.  Lungs: Clear to auscultation bilaterally Abdomen: Soft, nontender, nondistended.  PEG. Neurological: Alert. Moves all extremities 4. Cranial nerves II through XII grossly intact. Skin: Warm and dry. No rashes or lesions. Extremities: No clubbing or cyanosis. Bilateral LE edema.       Data Reviewed: I have personally reviewed following labs and imaging studies  CBC: Recent Labs  Lab 11/23/18 0441 11/24/18 0423 11/25/18 0431 11/26/18 0523 11/27/18 0501  WBC 9.7 10.7* 11.2* 11.8* 11.6*  HGB 8.1* 8.1* 8.3* 8.4* 8.3*  HCT 26.8* 27.2* 28.7* 28.1* 27.5*  MCV 100.0 99.3 101.4* 100.0 100.4*  PLT 127* 126* 121* 97* 95*   Basic Metabolic Panel: Recent Labs  Lab 11/23/18 0441 11/24/18 0423 11/25/18 0431 11/26/18 0523 11/27/18 0501  NA 143 144 146* 144 144  K 4.4 4.4 4.8 4.7 4.7  CL 105 104 105 105 104  CO2 _0 GLUCOSE 112* 125* 135* 146* 124*  BUN 110* 109* 107* 102* 101*  CREATININE 3.22* 3.16* 3.23* 2.92* 2.83*  CALCIUM 9.2 9.1 8.8* 8.6* 8.7*  MG  --   --   --  2.6* 2.5*   GFR: Estimated Creatinine Clearance: 22.9 mL/min (Camdan Burdi) (by C-G formula based on SCr of 2.83 mg/dL (H)). Liver Function Tests: Recent Labs  Lab 11/23/18 0441 11/24/18 0423 11/25/18 0431 11/26/18 0523 11/27/18 0501  AST 242* 194* 91* 51* 37  ALT 79* 79* 64* 46* 36  ALKPHOS 530* 520* 497* 427* 352*  BILITOT 0.7 0.9 0.6 0.7 0.7  PROT 5.5* 5.6* 5.5* 5.5* 5.2*  ALBUMIN 1.8* 1.8* 2.0* 1.9* 1.9*   No results for input(s): LIPASE, AMYLASE in the last 168 hours. No results for input(s): AMMONIA in the last 168 hours. Coagulation Profile: Recent Labs  Lab 11/22/18 0908 11/26/18 0523  INR 1.5* 1.2    Cardiac Enzymes: No results for input(s): CKTOTAL, CKMB, CKMBINDEX, TROPONINI in the last 168 hours. BNP (last 3 results) No results for input(s): PROBNP in the last 8760 hours. HbA1C: No results for input(s): HGBA1C in the last 72 hours. CBG: Recent Labs  Lab 11/26/18 0404 11/26/18 1119 11/26/18 2005 11/27/18 0508 11/27/18 1219  GLUCAP 143* 110* 115* 113* 143*   Lipid Profile: No results for input(s): CHOL, HDL, LDLCALC, TRIG, CHOLHDL, LDLDIRECT in the last 72 hours. Thyroid Function Tests: No results for input(s): TSH, T4TOTAL, FREET4, T3FREE, THYROIDAB in the last 72 hours. Anemia Panel: No results for input(s): VITAMINB12, FOLATE, FERRITIN, TIBC, IRON, RETICCTPCT in the last 72 hours. Sepsis Labs: No results for input(s): PROCALCITON, LATICACIDVEN in the last 168 hours.  Recent Results (from the past 240 hour(s))  Culture, blood (routine x 2)     Status: None   Collection Time: 11/18/18  5:47 PM   Specimen: BLOOD  Result Value Ref Range Status   Specimen Description BLOOD RIGHT ANTECUBITAL  Final   Special Requests   Final    BOTTLES DRAWN AEROBIC AND ANAEROBIC Blood Culture adequate volume   Culture   Final    NO GROWTH 5 DAYS Performed at [facility], [address], [place], [place] [address]    Report Status 11/23/2018 FINAL  Final  Culture, blood (routine x 2)     Status: None   Collection Time: 11/18/18  5:53 PM   Specimen: BLOOD RIGHT HAND  Result Value Ref Range Status   Specimen Description BLOOD RIGHT HAND  Final   Special Requests   Final  BOTTLES DRAWN AEROBIC AND ANAEROBIC Blood Culture adequate volume   Culture   Final    NO GROWTH 5 DAYS Performed at [facility], [place] [address], [place], [place] [address]    Report Status 11/23/2018 FINAL  Final  Culture, Urine     Status: Abnormal   Collection Time: 11/20/18 11:02 AM   Specimen: Urine, Catheterized  Result Value Ref Range Status   Specimen Description URINE, CATHETERIZED   Final   Special Requests NONE  Final   Culture (Ytzel Gubler)  Final    <10,000 COLONIES/mL INSIGNIFICANT GROWTH Performed at [facility], [place] [address], [place],  [address]    Report Status 11/21/2018 FINAL  Final         Radiology Studies: No results found.      Scheduled Meds:  sodium chloride   Intravenous Once   amLODipine  10 mg Oral Daily   calcium-vitamin D  1 tablet Oral Q breakfast   Chlorhexidine Gluconate Cloth  6 each Topical Daily   feeding supplement (OSMOLITE 1.5 CAL)  1,000 mL Per Tube Q24H   feeding supplement (PRO-STAT SUGAR FREE 64)  30 mL Per Tube TID   FLUoxetine  10 mg Oral Daily   levothyroxine  150 mcg Oral Q0600   mouth rinse  15 mL Mouth Rinse BID   metoCLOPramide (REGLAN) injection  10 mg Intravenous Q6H   metoprolol tartrate  25 mg Oral BID   montelukast  10 mg Oral QHS   multivitamin with minerals  1 tablet Oral Daily   pantoprazole sodium  40 mg Per Tube Daily   polyethylene glycol  17 g Per Tube Q M,W,F   sodium chloride flush  3 mL Intravenous Q12H   tamsulosin  0.4 mg Oral Daily   Continuous Infusions:  sodium chloride 10 mL/hr at 11/16/18 1500     LOS: 57 days    Time spent: over 30 min    Fayrene Helper, MD [facility] Pager AMION  If 7PM-7AM, please contact night-coverage [email or web address] Password [email or web address] 11/27/2018, 1:29 PM

## 2018-11-28 LAB — COMPREHENSIVE METABOLIC PANEL
ALT: 29 U/L (ref 0–44)
AST: 28 U/L (ref 15–41)
Albumin: 1.9 g/dL — ABNORMAL LOW (ref 3.5–5.0)
Alkaline Phosphatase: 338 U/L — ABNORMAL HIGH (ref 38–126)
Anion gap: 7 (ref 5–15)
BUN: 96 mg/dL — ABNORMAL HIGH (ref 6–20)
CO2: 32 mmol/L (ref 22–32)
Calcium: 8.5 mg/dL — ABNORMAL LOW (ref 8.9–10.3)
Chloride: 105 mmol/L (ref 98–111)
Creatinine, Ser: 2.7 mg/dL — ABNORMAL HIGH (ref 0.44–1.00)
GFR calc Af Amer: 22 mL/min — ABNORMAL LOW (ref >60)
GFR calc non Af Amer: 19 mL/min — ABNORMAL LOW (ref >60)
Glucose, Bld: 132 mg/dL — ABNORMAL HIGH (ref 70–99)
Potassium: 4.8 mmol/L (ref 3.5–5.1)
Sodium: 144 mmol/L (ref 135–145)
Total Bilirubin: 0.5 mg/dL (ref 0.3–1.2)
Total Protein: 5.3 g/dL — ABNORMAL LOW (ref 6.5–8.1)

## 2018-11-28 LAB — GLUCOSE, CAPILLARY
Glucose-Capillary: 117 mg/dL — ABNORMAL HIGH (ref 70–99)
Glucose-Capillary: 152 mg/dL — ABNORMAL HIGH (ref 70–99)
Glucose-Capillary: 126 mg/dL — ABNORMAL HIGH (ref 70–99)

## 2018-11-28 LAB — CBC
HCT: 28 % — ABNORMAL LOW (ref 36.0–46.0)
Hemoglobin: 8.2 g/dL — ABNORMAL LOW (ref 12.0–15.0)
MCH: 29.7 pg (ref 26.0–34.0)
MCHC: 29.3 g/dL — ABNORMAL LOW (ref 30.0–36.0)
MCV: 101.4 fL — ABNORMAL HIGH (ref 80.0–100.0)
Platelets: 85 10*3/uL — ABNORMAL LOW (ref 150–400)
RBC: 2.76 MIL/uL — ABNORMAL LOW (ref 3.87–5.11)
RDW: 16.3 % — ABNORMAL HIGH (ref 11.5–15.5)
WBC: 10.9 10*3/uL — ABNORMAL HIGH (ref 4.0–10.5)
nRBC: 0.2 % (ref 0.0–0.2)

## 2018-11-28 MED ORDER — INFLUENZA VAC SPLIT QUAD 0.5 ML IM SUSY
0.5000 mL | PREFILLED_SYRINGE | INTRAMUSCULAR | Status: AC | PRN
  Administered 2018-12-06: 19:00:00 0.5 mL via INTRAMUSCULAR

## 2018-11-28 NOTE — Progress Notes (Signed)
PROGRESS NOTE    Audrey Collier  XKG:[id number] DOB: 1960-05-27 DOA: 10/01/2018 PCP: Jani Gravel, MD   Brief Narrative:  Audrey Collier is Audrey Collier 58 year old female with past medical history significant of cognitive delay, anxiety, hypothyroidism, and post menopausal bleeding (worked up 5 years ago), who presented on 8/1 with altered mental status from group home with fever up to 103.8 F and emesis.  Chest x-ray showed bilateral pneumonia and patient was noted to be COVID-19 negative x2.  Treated initially with Rocephin and azithromycin for suspected community-acquired pneumonia.  Blood cultures grew out MSSA and patient was switched to Ancef with rifampin added per ID.  Patient required intubation on 8/4, due to respiratory failure and care was taken over by PCCM.  On 8/6 patient underwent TEE showing large aortic vegetation.  CT surgery was consulted, and she was taken for aortic valve replacement on 10/07/2018.  Postop patient noted to have renal insufficiency and antibiotics were adjusted.  Foley catheters had to be placed due to urinary retention.  She had continued to complain of intermittent abdominal pain and nausea. Overall patient's oral intake had been minimal and therefore core track was placed with dietary consultation for tube feeds.  GI was consulted and performed EGD which did not reveal any acute cause for patient's symptoms on 9/3.  General surgery was consulted due to mildly distended gallbladder concerning for cholecystitis, but HIDA scan was negative.  Due to lack of oral intake she underwent percutaneous gastric tube placement by IR on 9/4.  Nephrology consulted after creatinine elevated to 3.48. They were concerned for likely AIN vs post strep glomerulonephritis.  On 9/7 patient developed projectile vomiting and hemoglobin dropped down to 6.8 requiring transfusion of PRBCs.  Required transfer back into the ICU for close monitoring and palliative care was consulted at that time where family made  patient DNR/DNI.  Patient completed antibiotics for endocarditis on 9/17.  Patient developed low-grade fever on 9/18 up to 100.7 F.  Urinalysis concerning for infection, but urine culture inconclusive.  Labs on 9/22 show alkaline phosphatase 535, albumin 1.8, AST 433, ALT 77, and total bilirubin 0.7.  TRH called help with overall medical management.  Assessment & Plan:   Principal Problem:   Staphylococcus aureus bacteremia with sepsis ([place]) Active Problems:   Severe sepsis ([place])   Bacteremia due to methicillin susceptible Staphylococcus aureus (MSSA)   Aortic valve endocarditis   Acute respiratory failure with hypoxia ([name])   S/P AVR (aortic valve replacement)   Pressure injury of skin   Palliative care by specialist  Severe hypoalbuminemia with anasarca: Patient still with poor appetite. Albumin levels around 1.8.  Suspect patient likely third spacing due to poor nutritional status. -Continue strict intake and output  -Continue tube feeds per dietary  Acute kidney injury   Mild Hypernatremia: Patient with Thayden Lemire creatinine staying around 3 with decreased overall urine output.  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  Unable to have renal biopsy on 9/22 due to nausea and vomiting.   Renal biopsy obtained on 9/23. 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  Antibiotics completed 9/17.   Elevated Liver enzymes:  AST mildly elevated on 9/20 to 61, alk phos was 334 with normal bilirubin.  9/22 rose to AST 433 and alk phos 535 with  ALT 77.  Downtrending since then.   Unclear cause at this time.  Per GI c/s note from 9/23, suspected drug induced liver injury (concern for rifampin). Continuing to improve Crestor has been discontinued     Last abdominal ultrasound from 9/1 noted mildly distended gallbladder with 15 mm gallstone and small amount of sludge.  HIDA scan was negative for signs of acute cholecystitis at that time.    Hypothyroidism: Last TSH noted to be 19.826 on 10/30/2018. -Recheck TSH(8.35 appears to be trending down appropriately) -Continue levothyroxine  Anemia   throbocytopenia: Stable.  Hemoglobin 8.4 and platelets fluctuating. Relatively stable, follow. -Continue to monitor - stable today  Leukocytosis: mild, stable, continue to monitor  Cognitive delay: Stable.  [facility] previous  Nausea   Vomiting: this has been intermittent.  Continue reglan.  She had CT abdomen/pelvis on 9/7, could consider repeating this with her persistent emesis.  DVT prophylaxis: SCD Code Status: DNR Family Communication:none at bedside - discussed with sister 9/27 Disposition Plan: per primary - difficult placement per SW due to medicaid status   Consultants:  CT surgery is primary  Nephrology  ID  General Surgery  Cards  PCCM  IR  Palliative Care  Procedures:   TEE 8/6  s/p TAVR 8/7 by Dr. Kipp Brood  s/p PEG tube  Renal biopsy 9/23    Upper Endoscopy Impression - Normal esophagus. - Erythematous mucosa in the antrum. Biopsied. - Small hiatal hernia. - Normal examined duodenum. - The examination was otherwise normal. No source for her symptoms identicified endoscopically. Await gastric biopsy results. - Return patient to hospital ward for ongoing care. - Resume previous diet today. - Continue present medications. Continue to hold Lovenox in the event that gastrostomy tube placement in IR is planned for later today. - Await pathology results from the gastric  biopsies.  Antimicrobials:  Anti-infectives (From admission, onward)   Start     Dose/Rate Route Frequency Ordered Stop   11/10/18 2200  ceFAZolin (ANCEF) IVPB 2g/100 mL premix     2 g 200 mL/hr over 30 Minutes Intravenous Every 12 hours 11/10/18 1609 11/17/18 2359   11/09/18 1600  vancomycin (VANCOCIN) IVPB 1000 mg/200 mL premix  Status:  Discontinued     1,000 mg 200 mL/hr over 60 Minutes Intravenous Every 48 hours 11/07/18 1550 11/10/18 1604   11/07/18 1600  vancomycin (VANCOCIN) 2,000 mg in sodium chloride 0.9 % 500 mL IVPB     2,000 mg 250 mL/hr over 120 Minutes Intravenous  Once 11/07/18 1550 11/07/18 1931   11/07/18 1400  piperacillin-tazobactam (ZOSYN) IVPB 3.375 g  Status:  Discontinued     3.375 g 12.5 mL/hr over 240 Minutes Intravenous Every 8 hours 11/07/18 1147 11/10/18 1604   11/07/18 0830  rifampin (RIFADIN) capsule 300 mg  Status:  Discontinued     300 mg Oral Every 8 hours 11/07/18 0817 11/12/18 1231   10/20/18 0000  ceFAZolin (ANCEF) IVPB     2 g Intravenous Every 8 hours 10/20/18 1001 11/25/18 2359   10/14/18 0600  rifampin (RIFADIN) capsule 300 mg  Status:  Discontinued     300 mg Oral Every 8 hours 10/14/18 0456 11/07/18 0817   10/12/18 1200  ceFAZolin (ANCEF) IVPB 2g/100 mL premix  Status:  Discontinued  2 g 200 mL/hr over 30 Minutes Intravenous Every 8 hours 10/12/18 1053 11/07/18 1550   10/10/18 1300  ceFAZolin (ANCEF) IVPB 2g/100 mL premix  Status:  Discontinued     2 g 200 mL/hr over 30 Minutes Intravenous Every 12 hours 10/10/18 1147 10/12/18 1053   10/08/18 1400  rifampin (RIFADIN) 60 mg/mL oral suspension 300 mg  Status:  Discontinued     300 mg Oral Every 8 hours 10/08/18 0839 10/14/18 0456   10/07/18 2200  rifampin (RIFADIN) 60 mg/mL oral suspension 300 mg  Status:  Discontinued     300 mg Per Tube Every 8 hours 10/07/18 1714 10/08/18 0839   10/07/18 2000  vancomycin (VANCOCIN) IVPB 1000 mg/200 mL premix     1,000 mg 200 mL/hr over 60 Minutes  Intravenous  Once 10/07/18 1336 10/07/18 2158   10/07/18 0430  cefUROXime (ZINACEF) 750 mg in sodium chloride 0.9 % 100 mL IVPB  Status:  Discontinued     750 mg 200 mL/hr over 30 Minutes Intravenous To Surgery 10/06/18 1531 10/07/18 1248   10/07/18 0400  vancomycin (VANCOCIN) 1,000 mg in sodium chloride 0.9 % 1,000 mL irrigation      Irrigation To Surgery 10/06/18 1415 10/07/18 0932   10/07/18 0400  vancomycin (VANCOCIN) 1,250 mg in sodium chloride 0.9 % 250 mL IVPB     1,250 mg 166.7 mL/hr over 90 Minutes Intravenous To Surgery 10/06/18 1415 10/07/18 0800   10/07/18 0400  cefUROXime (ZINACEF) 1.5 g in sodium chloride 0.9 % 100 mL IVPB     1.5 g 200 mL/hr over 30 Minutes Intravenous To Surgery 10/06/18 1415 10/07/18 1129   10/06/18 1430  cefUROXime (ZINACEF) 750 mg in sodium chloride 0.9 % 100 mL IVPB  Status:  Discontinued     750 mg 200 mL/hr over 30 Minutes Intravenous To Surgery 10/06/18 1415 10/06/18 1532   10/05/18 1300  nafcillin 2 g in sodium chloride 0.9 % 100 mL IVPB  Status:  Discontinued     2 g 200 mL/hr over 30 Minutes Intravenous Every 4 hours 10/05/18 1109 10/10/18 1147   10/02/18 1200  ceFAZolin (ANCEF) IVPB 2g/100 mL premix  Status:  Discontinued     2 g 200 mL/hr over 30 Minutes Intravenous Every 8 hours 10/02/18 1040 10/05/18 1109   10/01/18 2000  cefTRIAXone (ROCEPHIN) 2 g in sodium chloride 0.9 % 100 mL IVPB  Status:  Discontinued     2 g 200 mL/hr over 30 Minutes Intravenous Every 24 hours 10/01/18 1958 10/02/18 1040   10/01/18 2000  azithromycin (ZITHROMAX) 500 mg in sodium chloride 0.9 % 250 mL IVPB  Status:  Discontinued     500 mg 250 mL/hr over 60 Minutes Intravenous Every 24 hours 10/01/18 1958 10/02/18 1040      Subjective: Had some emesis Calene Paradiso little while ago. No other complaints.  Objective: Vitals:   11/28/18 0013 11/28/18 0436 11/28/18 0808 11/28/18 1308  BP: (!) 148/85 (!) 155/88 (!) 152/77 (!) 146/89  Pulse: 79 81 89 88  Resp: _0 Temp: 98.1 F (36.7 C) 98.7 F (37.1 C) 98.4 F (36.9 C) 99.3 F (37.4 C)  TempSrc: Oral Oral Oral Oral  SpO2: 96% 97% 96% 91%  Weight:  88.9 kg    Height:        Intake/Output Summary (Last 24 hours) at 11/28/2018 1658 Last data filed at 11/28/2018 1606 Gross per 24 hour  Intake 1199.5 ml  Output 1100 ml  Net  99.5 ml   Filed Weights   11/26/18 0400 11/27/18 0500 11/28/18 0436  Weight: 90.1 kg 89.9 kg 88.9 kg    Examination:  General: No acute distress. Cardiovascular: Heart sounds show Hershy Flenner regular rate, and rhythm.  Lungs: Clear to auscultation bilaterally  Abdomen: Soft, nontender, nondistende.  Peg in place. Neurological: Alert. Moves all extremities 4. Cranial nerves II through XII grossly intact. Skin: Warm and dry. No rashes or lesions. Extremities: No clubbing or cyanosis. Bilateral edema      Data Reviewed: I have personally reviewed following labs and imaging studies  CBC: Recent Labs  Lab 11/24/18 0423 11/25/18 0431 11/26/18 0523 11/27/18 0501 11/28/18 0430  WBC 10.7* 11.2* 11.8* 11.6* 10.9*  HGB 8.1* 8.3* 8.4* 8.3* 8.2*  HCT 27.2* 28.7* 28.1* 27.5* 28.0*  MCV 99.3 101.4* 100.0 100.4* 101.4*  PLT 126* 121* 97* 95* 85*   Basic Metabolic Panel: Recent Labs  Lab 11/24/18 0423 11/25/18 0431 11/26/18 0523 11/27/18 0501 11/28/18 0430  NA 144 146* 144 144 144  K 4.4 4.8 4.7 4.7 4.8  CL 104 105 105 104 105  CO2 _0 32  GLUCOSE 125* 135* 146* 124* 132*  BUN 109* 107* 102* 101* 96*  CREATININE 3.16* 3.23* 2.92* 2.83* 2.70*  CALCIUM 9.1 8.8* 8.6* 8.7* 8.5*  MG  --   --  2.6* 2.5*  --    GFR: Estimated Creatinine Clearance: 23.8 mL/min (Krystol Rocco) (by C-G formula based on SCr of 2.7 mg/dL (H)). Liver Function Tests: Recent Labs  Lab 11/24/18 0423 11/25/18 0431 11/26/18 0523 11/27/18 0501 11/28/18 0430  AST 194* 91* 51* 37 28  ALT 79* 64* 46* 36 29  ALKPHOS 520* 497* 427* 352* 338*  BILITOT 0.9 0.6 0.7 0.7 0.5  PROT 5.6* 5.5* 5.5* 5.2*  5.3*  ALBUMIN 1.8* 2.0* 1.9* 1.9* 1.9*   No results for input(s): LIPASE, AMYLASE in the last 168 hours. No results for input(s): AMMONIA in the last 168 hours. Coagulation Profile: Recent Labs  Lab 11/22/18 0908 11/26/18 0523  INR 1.5* 1.2   Cardiac Enzymes: No results for input(s): CKTOTAL, CKMB, CKMBINDEX, TROPONINI in the last 168 hours. BNP (last 3 results) No results for input(s): PROBNP in the last 8760 hours. HbA1C: No results for input(s): HGBA1C in the last 72 hours. CBG: Recent Labs  Lab 11/27/18 1219 11/27/18 1631 11/27/18 2006 11/28/18 0430 11/28/18 1140  GLUCAP 143* 103* 128* 117* 152*   Lipid Profile: No results for input(s): CHOL, HDL, LDLCALC, TRIG, CHOLHDL, LDLDIRECT in the last 72 hours. Thyroid Function Tests: No results for input(s): TSH, T4TOTAL, FREET4, T3FREE, THYROIDAB in the last 72 hours. Anemia Panel: No results for input(s): VITAMINB12, FOLATE, FERRITIN, TIBC, IRON, RETICCTPCT in the last 72 hours. Sepsis Labs: No results for input(s): PROCALCITON, LATICACIDVEN in the last 168 hours.  Recent Results (from the past 240 hour(s))  Culture, blood (routine x 2)     Status: None   Collection Time: 11/18/18  5:47 PM   Specimen: BLOOD  Result Value Ref Range Status   Specimen Description BLOOD RIGHT ANTECUBITAL  Final   Special Requests   Final    BOTTLES DRAWN AEROBIC AND ANAEROBIC Blood Culture adequate volume   Culture   Final    NO GROWTH 5 DAYS Performed at [facility], [address], [place], [place] [address]    Report Status 11/23/2018 FINAL  Final  Culture, blood (routine x 2)     Status: None   Collection  Time: 11/18/18  5:53 PM   Specimen: BLOOD RIGHT HAND  Result Value Ref Range Status   Specimen Description BLOOD RIGHT HAND  Final   Special Requests   Final    BOTTLES DRAWN AEROBIC AND ANAEROBIC Blood Culture adequate volume   Culture   Final    NO GROWTH 5 DAYS Performed at [facility], [address],  [place], [place] [address]    Report Status 11/23/2018 FINAL  Final  Culture, Urine     Status: Abnormal   Collection Time: 11/20/18 11:02 AM   Specimen: Urine, Catheterized  Result Value Ref Range Status   Specimen Description URINE, CATHETERIZED  Final   Special Requests NONE  Final   Culture (Florabel Faulks)  Final    <10,000 COLONIES/mL INSIGNIFICANT GROWTH Performed at [facility], [place] [address], [place], [place] [address]    Report Status 11/21/2018 FINAL  Final         Radiology Studies: No results found.      Scheduled Meds:  sodium chloride   Intravenous Once   amLODipine  10 mg Oral Daily   calcium-vitamin D  1 tablet Oral Q breakfast   Chlorhexidine Gluconate Cloth  6 each Topical Daily   feeding supplement (OSMOLITE 1.5 CAL)  1,000 mL Per Tube Q24H   feeding supplement (PRO-STAT SUGAR FREE 64)  30 mL Per Tube TID   FLUoxetine  10 mg Oral Daily   levothyroxine  150 mcg Oral Q0600   mouth rinse  15 mL Mouth Rinse BID   metoCLOPramide (REGLAN) injection  10 mg Intravenous Q6H   metoprolol tartrate  25 mg Oral BID   montelukast  10 mg Oral QHS   multivitamin with minerals  1 tablet Oral Daily   pantoprazole sodium  40 mg Per Tube Daily   polyethylene glycol  17 g Per Tube Q M,W,F   sodium chloride flush  3 mL Intravenous Q12H   tamsulosin  0.4 mg Oral Daily   Continuous Infusions:  sodium chloride 10 mL/hr at 11/16/18 1500     LOS: 58 days    Time spent: over 30 min    Fayrene Helper, MD [facility] Pager AMION  If 7PM-7AM, please contact night-coverage [email or web address] Password [facility] 11/28/2018, 4:58 PM

## 2018-11-28 NOTE — Progress Notes (Addendum)
      [address]       [place] [address]             [phone]      14 Days Post-Op Procedure(s) (LRB): ESOPHAGOGASTRODUODENOSCOPY (EGD) WITH PROPOFOL (N/A) BIOPSY  Subjective: She was sleeping this am awakened. When asked if belly hurts, she stated yes.  Objective: Vital signs in last 24 hours: Temp:  [98.1 F (36.7 C)-98.7 F (37.1 C)] 98.7 F (37.1 C) (09/28 0436) Pulse Rate:  [79-91] 81 (09/28 0436) Cardiac Rhythm: Normal sinus rhythm (09/27 1900) Resp:  [19-28] 20 (09/28 0436) BP: (138-155)/(81-98) 155/88 (09/28 0436) SpO2:  [92 %-100 %] 97 % (09/28 0436) Weight:  [88.9 kg] 88.9 kg (09/28 0436)  Intake/Output from previous day: 09/27 0701 - 09/28 0700 In: 735 [I.V.:40; NG/GT:585] Out: 1400 [Urine:1400]   Heart: regular rate and rhythm Lungs: Clear bilaterally Abdomen: Soft, distended, hypoactive BS Extremities: Mild edema Wound: Sternal wound is clean and dry  Lab Results: Recent Labs    11/27/18 0501 11/28/18 0430  WBC 11.6* 10.9*  HGB 8.3* 8.2*  HCT 27.5* 28.0*  PLT 95* 85*   BMET:  Recent Labs    11/27/18 0501 11/28/18 0430  NA 144 144  K 4.7 4.8  CL 104 105  CO2 31 32  GLUCOSE 124* 132*  BUN 101* 96*  CREATININE 2.83* 2.70*  CALCIUM 8.7* 8.5*    PT/INR:  Recent Labs    11/26/18 0523  LABPROT 15.2  INR 1.2   ABG    Component Value Date/Time   PHART 7.430 10/08/2018 0920   HCO3 23.6 10/08/2018 0920   TCO2 25 10/08/2018 0920   ACIDBASEDEF 6.0 (H) 10/08/2018 0307   O2SAT 94.0 10/08/2018 0920   CBG (last 3)  Recent Labs    11/27/18 1631 11/27/18 2006 11/28/18 0430  GLUCAP 103* 128* 117*    Assessment/Plan: S/P Procedure(s) (LRB): ESOPHAGOGASTRODUODENOSCOPY (EGD) WITH PROPOFOL (N/A) BIOPSY  1. CV- She has been maintaining SR with HR in the 70's. On Lopressor 25 mg bid and Amlodipine 10 mg daily. 2. Pulm- On 2 liters of oxygen via [place]. Encourage incentive spirometer 3. AKI- creatinine decreased to  2.7. S/p kidney biopsy. Per nephrology, no acute indication for dialysis; renal function appears to be stabilizing. 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 By her history it's from endocarditis and not much from the renal standpoint to do at this time. No immunomodulatory therapies. 4. GI- continues to have poor oral intake, on tube feeds at 45 ml/hr. She has severe hypoalbuminemia with anasarca. 5. Deconditioning PT/OT 6. Anemia-H and H this am stable at 8.2 and 28 7. Mild thrombocytopenia-platelets this am decreased to 85,000 8. Hypothyroidism-continue Levothyroxine 150 mcg daily 9. Awaiting placement  Nani Skillern PA-C [phone]  Agree with above Liver and kidney function improving Continuing tube feeds No surgical issues at this point Awaiting bed  [place]

## 2018-11-28 NOTE — Progress Notes (Signed)
Nutrition Follow-up  DOCUMENTATION CODES:   Not applicable  INTERVENTION:   Continue tube feeding:  -Osmolite 1.5 @ 45 ml/hr x 24 hours -30 ml Prostat TID   Provides: 1920 kcals, 113 grams protein, 823 ml free water. Meets 100% of needs.   NUTRITION DIAGNOSIS:   Inadequate oral intake related to poor appetite as evidenced by meal completion < 50%.  Ongoing  GOAL:   Patient will meet greater than or equal to 90% of their needs  Addressed via TF  MONITOR:   PO intake, Supplement acceptance, Diet advancement, TF tolerance, Weight trends, Labs, I & O's, Skin  REASON FOR ASSESSMENT:   Consult Enteral/tube feeding initiation and management(nocturnal)  ASSESSMENT:   58 yo female admitted with fever, vomiting, weakness. Found to have MSSA bacteremia with PNA. Echo showed vegetation. Required transfer to the ICU and intubation on 8/4. PMH includes developmental delay, GERD, anxiety, hypothyroidism, HLD.   8/6- TEE showing large aortic valve vegetation 8/7- s/p aortic valve replacement 8/8- extubated, diet advanced to Dysphagia 2 8/21- Cortrak placement(tip in stomach); spoke with TCTS, ok to start TF 9/1- HIDA negative 9/3- Cortrak pulled during EGD 9/4- s/p PEG placement 9/7- PEG to gravity, CT abdomen negative 9/8- clear liquids, multiple episodes vomiting 9/23- diet advanced to regular   Pt receiving Osmolite 1.5 @ goal rate of 45 ml/hr during RD follow up. RD observed vomit on pt's shirt. Per RN pt vomited yesterday morning as well. Unsure if pt had PO intake prior as this usually causes vomiting vs tube feeding. She continues with reglan and zofran which help intermittently. Would consider transition to J-tube if vomiting continues as this has been an ongoing issue.   Admission weight: 77.7 kg Current weight: 88.9 kg   Pt with +2 upper extremity edema and +3 lower extremity edema- no improvements.   I/O: -404 ml since 9/14 UOP: 1,400 ml x 24 hrs   Medications:  oscal with D, reglan, MVI, Vit K, miralax Labs: Cr 2.70- trending down CBG 103-152   Diet Order:   Diet Order            Diet regular Room service appropriate? Yes; Fluid consistency: Thin  Diet effective now              EDUCATION NEEDS:   No education needs have been identified at this time  Skin:  Skin Assessment: Skin Integrity Issues: Skin Integrity Issues:: Stage II, Incisions DTI: R ear Stage II: R buttocks Incisions: chest Other: MASD-arm, buttocks, labia  Last BM:  9/27  Height:   Ht Readings from Last 1 Encounters:  10/07/18 5\' 2"  (1.575 m)    Weight:   Wt Readings from Last 1 Encounters:  11/28/18 88.9 kg    Ideal Body Weight:  56.8 kg  BMI:  Body mass index is 35.85 kg/m.  Estimated Nutritional Needs:   Kcal:  1800-2000  Protein:  100-115 gm  Fluid:  >/= 1.8 L   Mariana Single RD, LDN Clinical Nutrition Pager # [phone]

## 2018-11-29 LAB — GLUCOSE, CAPILLARY
Glucose-Capillary: 131 mg/dL — ABNORMAL HIGH (ref 70–99)
Glucose-Capillary: 138 mg/dL — ABNORMAL HIGH (ref 70–99)
Glucose-Capillary: 141 mg/dL — ABNORMAL HIGH (ref 70–99)

## 2018-11-29 LAB — CBC
HCT: 28.8 % — ABNORMAL LOW (ref 36.0–46.0)
Hemoglobin: 8 g/dL — ABNORMAL LOW (ref 12.0–15.0)
MCH: 28.7 pg (ref 26.0–34.0)
MCHC: 27.8 g/dL — ABNORMAL LOW (ref 30.0–36.0)
MCV: 103.2 fL — ABNORMAL HIGH (ref 80.0–100.0)
Platelets: 84 10*3/uL — ABNORMAL LOW (ref 150–400)
RBC: 2.79 MIL/uL — ABNORMAL LOW (ref 3.87–5.11)
RDW: 16.5 % — ABNORMAL HIGH (ref 11.5–15.5)
WBC: 13.2 10*3/uL — ABNORMAL HIGH (ref 4.0–10.5)
nRBC: 0.2 % (ref 0.0–0.2)

## 2018-11-29 LAB — BASIC METABOLIC PANEL
Anion gap: 9 (ref 5–15)
BUN: 87 mg/dL — ABNORMAL HIGH (ref 6–20)
CO2: 32 mmol/L (ref 22–32)
Calcium: 8.8 mg/dL — ABNORMAL LOW (ref 8.9–10.3)
Chloride: 106 mmol/L (ref 98–111)
Creatinine, Ser: 2.58 mg/dL — ABNORMAL HIGH (ref 0.44–1.00)
GFR calc Af Amer: 23 mL/min — ABNORMAL LOW (ref >60)
GFR calc non Af Amer: 20 mL/min — ABNORMAL LOW (ref >60)
Glucose, Bld: 148 mg/dL — ABNORMAL HIGH (ref 70–99)
Potassium: 5.1 mmol/L (ref 3.5–5.1)
Sodium: 147 mmol/L — ABNORMAL HIGH (ref 135–145)

## 2018-11-29 LAB — MAGNESIUM: Magnesium: 2.5 mg/dL — ABNORMAL HIGH (ref 1.7–2.4)

## 2018-11-29 MED ORDER — FREE WATER
200.0000 mL | Freq: Three times a day (TID) | Status: DC
  Administered 2018-11-29 – 2018-12-06 (×19): 200 mL

## 2018-11-29 NOTE — Progress Notes (Addendum)
      [address]       [place] [address]             [phone]      14 Days Post-Op Procedure(s) (LRB): ESOPHAGOGASTRODUODENOSCOPY (EGD) WITH PROPOFOL (N/A) BIOPSY  Subjective: Patient denies abdominal pain or nausea this am.  Objective: Vital signs in last 24 hours: Temp:  [97.9 F (36.6 C)-99.5 F (37.5 C)] 99.5 F (37.5 C) (09/29 0400) Pulse Rate:  [75-98] 76 (09/29 0600) Cardiac Rhythm: Normal sinus rhythm (09/28 1908) Resp:  [17-31] 17 (09/29 0600) BP: (146-162)/(77-92) 148/85 (09/29 0600) SpO2:  [90 %-96 %] 96 % (09/29 0600) Weight:  [88.3 kg] 88.3 kg (09/29 0500)  Intake/Output from previous day: 09/28 0701 - 09/29 0700 In: 1394.5 [P.O.:250; I.V.:50; NG/GT:994.5] Out: 700 [Urine:700]   Heart: RRR Lungs: Clear bilaterally Abdomen: Soft, some distention, hyperactive bowel sounds this am Extremities: SCDs in place Wound: Sternal wound is clean and dry  Lab Results: Recent Labs    11/28/18 0430 11/29/18 0431  WBC 10.9* 13.2*  HGB 8.2* 8.0*  HCT 28.0* 28.8*  PLT 85* 84*   BMET:  Recent Labs    11/28/18 0430 11/29/18 0431  NA 144 147*  K 4.8 5.1  CL 105 106  CO2 32 32  GLUCOSE 132* 148*  BUN 96* 87*  CREATININE 2.70* 2.58*  CALCIUM 8.5* 8.8*    PT/INR:  No results for input(s): LABPROT, INR in the last 72 hours. ABG    Component Value Date/Time   PHART 7.430 10/08/2018 0920   HCO3 23.6 10/08/2018 0920   TCO2 25 10/08/2018 0920   ACIDBASEDEF 6.0 (H) 10/08/2018 0307   O2SAT 94.0 10/08/2018 0920   CBG (last 3)  Recent Labs    11/28/18 1140 11/28/18 2105 11/29/18 0403  GLUCAP 152* 126* 131*    Assessment/Plan: S/P Procedure(s) (LRB): ESOPHAGOGASTRODUODENOSCOPY (EGD) WITH PROPOFOL (N/A) BIOPSY  1. CV- She has been maintaining SR with HR in the 80's this am and is hypertensive. On Lopressor 25 mg bid and Amlodipine 10 mg daily. 2. Pulm- On 2 liters of oxygen via [place]. Encourage incentive spirometer 3. AKI-  creatinine slightly decreased to 2.58. S/p kidney biopsy. Per nephrology, no acute indication for dialysis; renal function appears to be stabilizing. 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 By her history it's from endocarditis and not much from the renal standpoint to do at this time. No immunomodulatory therapies. 4. GI- continues to have poor oral intake, on tube feeds at 45 ml/hr. She has severe hypoalbuminemia with anasarca. 5. Deconditioning PT/OT 6. Anemia-H and H this am stable at 8 and 28.8 7. Mild thrombocytopenia-platelets this am 84,000 8. Hypothyroidism-continue Levothyroxine 150 mcg daily 9. WBC slightly increased to 13,200. No fever, no wound infection. 10. CBGs 152/126/131  Nani Skillern PA-C [phone]  Agree with above Doing well Awaiting placement.  Elnita Surprenant Bary Leriche

## 2018-11-29 NOTE — TOC Progression Note (Signed)
Transition of Care [facility]) - Progression Note    Patient Details  Name: Audrey Collier MRN: [id number] Date of Birth: 01/14/1961  Transition of Care [facility]) CM/SW [place], [place] Phone Number: 11/29/2018, 10:21 AM  Clinical Narrative:     Patient has no bed offers  CSW spoke with [facility]- they have no availability-  CSW will continue to follow and seek placement.  [name], MSW, [facility] Clinical Social Worker [phone]   Expected Discharge Plan: [place] Barriers to Discharge: No SNF bed, Continued Medical Work up  Expected Discharge Plan and Services Expected Discharge Plan: [place] In-house Referral: Clinical Social Work Discharge Planning Services: NA Post Acute Care Choice: [place] Living arrangements for the past 2 months: Single Family Home                 DME Arranged: N/A DME Agency: NA                   Social Determinants of Health (SDOH) Interventions    Readmission Risk Interventions No flowsheet data found.

## 2018-11-29 NOTE — Progress Notes (Signed)
PROGRESS NOTE    Audrey Collier  QMV:[id number] DOB: [birth year] DOA: 10/01/2018 PCP: Jani Gravel, MD   Brief Narrative:  Audrey Collier is Audrey Collier 58 year old female with past medical history significant of cognitive delay, anxiety, hypothyroidism, and post menopausal bleeding (worked up 5 years ago), who presented on 8/1 with altered mental status from group home with fever up to 103.8 F and emesis.  Chest x-ray showed bilateral pneumonia and patient was noted to be COVID-19 negative x2.  Treated initially with Rocephin and azithromycin for suspected community-acquired pneumonia.  Blood cultures grew out MSSA and patient was switched to Ancef with rifampin added per ID.  Patient required intubation on 8/4, due to respiratory failure and care was taken over by PCCM.  On 8/6 patient underwent TEE showing large aortic vegetation.  CT surgery was consulted, and she was taken for aortic valve replacement on 10/07/2018.  Postop patient noted to have renal insufficiency and antibiotics were adjusted.  Foley catheters had to be placed due to urinary retention.  She had continued to complain of intermittent abdominal pain and nausea. Overall patient's oral intake had been minimal and therefore core track was placed with dietary consultation for tube feeds.  GI was consulted and performed EGD which did not reveal any acute cause for patient's symptoms on 9/3.  General surgery was consulted due to mildly distended gallbladder concerning for cholecystitis, but HIDA scan was negative.  Due to lack of oral intake she underwent percutaneous gastric tube placement by IR on 9/4.  Nephrology consulted after creatinine elevated to 3.48. They were concerned for likely AIN vs post strep glomerulonephritis.  On 9/7 patient developed projectile vomiting and hemoglobin dropped down to 6.8 requiring transfusion of PRBCs.  Required transfer back into the ICU for close monitoring and palliative care was consulted at that time where family made  patient DNR/DNI.  Patient completed antibiotics for endocarditis on 9/17.  Patient developed low-grade fever on 9/18 up to 100.7 F.  Urinalysis concerning for infection, but urine culture inconclusive.  Labs on 9/22 show alkaline phosphatase 535, albumin 1.8, AST 433, ALT 77, and total bilirubin 0.7.  TRH called help with overall medical management.  Assessment & Plan:   Principal Problem:   Staphylococcus aureus bacteremia with sepsis ([place]) Active Problems:   Severe sepsis ([place])   Bacteremia due to methicillin susceptible Staphylococcus aureus (MSSA)   Aortic valve endocarditis   Acute respiratory failure with hypoxia ([name])   S/P AVR (aortic valve replacement)   Pressure injury of skin   Palliative care by specialist  Severe hypoalbuminemia with anasarca: Patient still with poor appetite. Albumin levels around 1.8.  Suspect patient likely third spacing due to poor nutritional status. -Continue strict intake and output  -Continue tube feeds per dietary  Acute kidney injury   Mild Hypernatremia: Patient with Saquoia Sianez creatinine staying around 3 with decreased overall urine output.  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  Unable to have renal biopsy on 9/22 due to nausea and vomiting.   Renal biopsy obtained on 9/23. 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 Antibiotics completed 9/17.   Elevated Liver enzymes:  AST mildly elevated on 9/20 to 61, alk phos was 334 with normal bilirubin.  9/22 rose to AST  433 and alk phos 535 with ALT 77.  Downtrending since then.   Unclear cause at this time.  Per GI c/s note from 9/23, suspected drug induced liver injury (concern for rifampin). Continuing to improve Crestor has been discontinued     Last abdominal ultrasound from 9/1 noted mildly distended gallbladder with 15 mm gallstone and small amount of sludge.  HIDA scan was negative for signs of acute cholecystitis at that time.    Hypothyroidism: Last TSH noted to be 19.826 on 10/30/2018. -Recheck TSH(8.35 appears to be trending down appropriately) -Continue levothyroxine  Anemia   throbocytopenia: Stable.  Hemoglobin 8.0 and platelets fluctuating. Relatively stable, follow. -Continue to monitor - stable today  Leukocytosis: mild, stable, continue to monitor  Cognitive delay: Stable.  [facility] previous  Nausea   Vomiting: this has been intermittent.  Continue reglan.  She had CT abdomen/pelvis on 9/7, could consider repeating this with her persistent emesis. Will repeat CT abdomen pelvis today  DVT prophylaxis: SCD Code Status: DNR Family Communication:none at bedside - discussed with sister 9/27 Disposition Plan: per primary - difficult placement per SW due to medicaid status   Consultants:  CT surgery is primary  Nephrology  ID  General Surgery  Cards  PCCM  IR  Palliative Care  Procedures:   TEE 8/6  s/p TAVR 8/7 by Dr. Kipp Brood  s/p PEG tube  Renal biopsy 9/23    Upper Endoscopy Impression - Normal esophagus. - Erythematous mucosa in the antrum. Biopsied. - Small hiatal hernia. - Normal examined duodenum. - The examination was otherwise normal. No source for her symptoms identicified endoscopically. Await gastric biopsy results. - Return patient to hospital ward for ongoing care. - Resume previous diet today. - Continue present medications. Continue to hold Lovenox in the event that gastrostomy tube placement in IR is planned  for later today. - Await pathology results from the gastric biopsies.  Antimicrobials:  Anti-infectives (From admission, onward)   Start     Dose/Rate Route Frequency Ordered Stop   11/10/18 2200  ceFAZolin (ANCEF) IVPB 2g/100 mL premix     2 g 200 mL/hr over 30 Minutes Intravenous Every 12 hours 11/10/18 1609 11/17/18 2359   11/09/18 1600  vancomycin (VANCOCIN) IVPB 1000 mg/200 mL premix  Status:  Discontinued     1,000 mg 200 mL/hr over 60 Minutes Intravenous Every 48 hours 11/07/18 1550 11/10/18 1604   11/07/18 1600  vancomycin (VANCOCIN) 2,000 mg in sodium chloride 0.9 % 500 mL IVPB     2,000 mg 250 mL/hr over 120 Minutes Intravenous  Once 11/07/18 1550 11/07/18 1931   11/07/18 1400  piperacillin-tazobactam (ZOSYN) IVPB 3.375 g  Status:  Discontinued     3.375 g 12.5 mL/hr over 240 Minutes Intravenous Every 8 hours 11/07/18 1147 11/10/18 1604   11/07/18 0830  rifampin (RIFADIN) capsule 300 mg  Status:  Discontinued     300 mg Oral Every 8 hours 11/07/18 0817 11/12/18 1231   10/20/18 0000  ceFAZolin (ANCEF) IVPB     2 g Intravenous Every 8 hours 10/20/18 1001 11/25/18 2359   10/14/18 0600  rifampin (RIFADIN) capsule 300 mg  Status:  Discontinued     300 mg Oral Every 8 hours 10/14/18 0456 11/07/18 0817  10/12/18 1200  ceFAZolin (ANCEF) IVPB 2g/100 mL premix  Status:  Discontinued     2 g 200 mL/hr over 30 Minutes Intravenous Every 8 hours 10/12/18 1053 11/07/18 1550   10/10/18 1300  ceFAZolin (ANCEF) IVPB 2g/100 mL premix  Status:  Discontinued     2 g 200 mL/hr over 30 Minutes Intravenous Every 12 hours 10/10/18 1147 10/12/18 1053   10/08/18 1400  rifampin (RIFADIN) 60 mg/mL oral suspension 300 mg  Status:  Discontinued     300 mg Oral Every 8 hours 10/08/18 0839 10/14/18 0456   10/07/18 2200  rifampin (RIFADIN) 60 mg/mL oral suspension 300 mg  Status:  Discontinued     300 mg Per Tube Every 8 hours 10/07/18 1714 10/08/18 0839   10/07/18 2000  vancomycin (VANCOCIN) IVPB 1000  mg/200 mL premix     1,000 mg 200 mL/hr over 60 Minutes Intravenous  Once 10/07/18 1336 10/07/18 2158   10/07/18 0430  cefUROXime (ZINACEF) 750 mg in sodium chloride 0.9 % 100 mL IVPB  Status:  Discontinued     750 mg 200 mL/hr over 30 Minutes Intravenous To Surgery 10/06/18 1531 10/07/18 1248   10/07/18 0400  vancomycin (VANCOCIN) 1,000 mg in sodium chloride 0.9 % 1,000 mL irrigation      Irrigation To Surgery 10/06/18 1415 10/07/18 0932   10/07/18 0400  vancomycin (VANCOCIN) 1,250 mg in sodium chloride 0.9 % 250 mL IVPB     1,250 mg 166.7 mL/hr over 90 Minutes Intravenous To Surgery 10/06/18 1415 10/07/18 0800   10/07/18 0400  cefUROXime (ZINACEF) 1.5 g in sodium chloride 0.9 % 100 mL IVPB     1.5 g 200 mL/hr over 30 Minutes Intravenous To Surgery 10/06/18 1415 10/07/18 1129   10/06/18 1430  cefUROXime (ZINACEF) 750 mg in sodium chloride 0.9 % 100 mL IVPB  Status:  Discontinued     750 mg 200 mL/hr over 30 Minutes Intravenous To Surgery 10/06/18 1415 10/06/18 1532   10/05/18 1300  nafcillin 2 g in sodium chloride 0.9 % 100 mL IVPB  Status:  Discontinued     2 g 200 mL/hr over 30 Minutes Intravenous Every 4 hours 10/05/18 1109 10/10/18 1147   10/02/18 1200  ceFAZolin (ANCEF) IVPB 2g/100 mL premix  Status:  Discontinued     2 g 200 mL/hr over 30 Minutes Intravenous Every 8 hours 10/02/18 1040 10/05/18 1109   10/01/18 2000  cefTRIAXone (ROCEPHIN) 2 g in sodium chloride 0.9 % 100 mL IVPB  Status:  Discontinued     2 g 200 mL/hr over 30 Minutes Intravenous Every 24 hours 10/01/18 1958 10/02/18 1040   10/01/18 2000  azithromycin (ZITHROMAX) 500 mg in sodium chloride 0.9 % 250 mL IVPB  Status:  Discontinued     500 mg 250 mL/hr over 60 Minutes Intravenous Every 24 hours 10/01/18 1958 10/02/18 1040      Subjective: No complaints today Recurrent emesis today per nursing.  Objective: Vitals:   11/29/18 0600 11/29/18 0800 11/29/18 1108 11/29/18 1200  BP: (!) 148/85 (!) 152/84 (!) 161/89  135/84  Pulse: 76 77 73 74  Resp: 17 18 16 15   Temp:  98.7 F (37.1 C) 98.1 F (36.7 C)   TempSrc:  Oral Oral   SpO2: 96% 97% 98% 98%  Weight:      Height:        Intake/Output Summary (Last 24 hours) at 11/29/2018 1417 Last data filed at 11/29/2018 1100 Gross per 24 hour  Intake 1394.5 ml  Output 901 ml  Net 493.5 ml   Filed Weights   11/27/18 0500 11/28/18 0436 11/29/18 0500  Weight: 89.9 kg 88.9 kg 88.3 kg    Examination:  General: No acute distress. Cardiovascular: Heart sounds show Delcenia Inman regular rate, and rhythm. . Lungs: Clear to auscultation bilaterally  Abdomen: Soft, nontender, nondistended.  PEG in place. Neurological: Alert and oriented 3. Moves all extremities 4. Cranial nerves II through XII grossly intact. Skin: Warm and dry. No rashes or lesions. Extremities: No clubbing or cyanosis. Bilateral LE edema.        Data Reviewed: I have personally reviewed following labs and imaging studies  CBC: Recent Labs  Lab 11/25/18 0431 11/26/18 0523 11/27/18 0501 11/28/18 0430 11/29/18 0431  WBC 11.2* 11.8* 11.6* 10.9* 13.2*  HGB 8.3* 8.4* 8.3* 8.2* 8.0*  HCT 28.7* 28.1* 27.5* 28.0* 28.8*  MCV 101.4* 100.0 100.4* 101.4* 103.2*  PLT 121* 97* 95* 85* 84*   Basic Metabolic Panel: Recent Labs  Lab 11/25/18 0431 11/26/18 0523 11/27/18 0501 11/28/18 0430 11/29/18 0431  NA 146* 144 144 144 147*  K 4.8 4.7 4.7 4.8 5.1  CL 105 105 104 105 106  CO2 31 30 31  32 32  GLUCOSE 135* 146* 124* 132* 148*  BUN 107* 102* 101* 96* 87*  CREATININE 3.23* 2.92* 2.83* 2.70* 2.58*  CALCIUM 8.8* 8.6* 8.7* 8.5* 8.8*  MG  --  2.6* 2.5*  --  2.5*   GFR: Estimated Creatinine Clearance: 24.8 mL/min (Jaequan Propes) (by C-G formula based on SCr of 2.58 mg/dL (H)). Liver Function Tests: Recent Labs  Lab 11/24/18 0423 11/25/18 0431 11/26/18 0523 11/27/18 0501 11/28/18 0430  AST 194* 91* 51* 37 28  ALT 79* 64* 46* 36 29  ALKPHOS 520* 497* 427* 352* 338*  BILITOT 0.9 0.6 0.7 0.7 0.5    PROT 5.6* 5.5* 5.5* 5.2* 5.3*  ALBUMIN 1.8* 2.0* 1.9* 1.9* 1.9*   No results for input(s): LIPASE, AMYLASE in the last 168 hours. No results for input(s): AMMONIA in the last 168 hours. Coagulation Profile: Recent Labs  Lab 11/26/18 0523  INR 1.2   Cardiac Enzymes: No results for input(s): CKTOTAL, CKMB, CKMBINDEX, TROPONINI in the last 168 hours. BNP (last 3 results) No results for input(s): PROBNP in the last 8760 hours. HbA1C: No results for input(s): HGBA1C in the last 72 hours. CBG: Recent Labs  Lab 11/28/18 0430 11/28/18 1140 11/28/18 2105 11/29/18 0403 11/29/18 1112  GLUCAP 117* 152* 126* 131* 138*   Lipid Profile: No results for input(s): CHOL, HDL, LDLCALC, TRIG, CHOLHDL, LDLDIRECT in the last 72 hours. Thyroid Function Tests: No results for input(s): TSH, T4TOTAL, FREET4, T3FREE, THYROIDAB in the last 72 hours. Anemia Panel: No results for input(s): VITAMINB12, FOLATE, FERRITIN, TIBC, IRON, RETICCTPCT in the last 72 hours. Sepsis Labs: No results for input(s): PROCALCITON, LATICACIDVEN in the last 168 hours.  Recent Results (from the past 240 hour(s))  Culture, Urine     Status: Abnormal   Collection Time: 11/20/18 11:02 AM   Specimen: Urine, Catheterized  Result Value Ref Range Status   Specimen Description URINE, CATHETERIZED  Final   Special Requests NONE  Final   Culture (Roland Lipke)  Final    <10,000 COLONIES/mL INSIGNIFICANT GROWTH Performed at [facility], [address] [place], [place] [address]    Report Status 11/21/2018 FINAL  Final         Radiology Studies: No results found.      Scheduled Meds:  sodium chloride  Intravenous Once   amLODipine  10 mg Oral Daily   calcium-vitamin D  1 tablet Oral Q breakfast   Chlorhexidine Gluconate Cloth  6 each Topical Daily   feeding supplement (OSMOLITE 1.5 CAL)  1,000 mL Per Tube Q24H   feeding supplement (PRO-STAT SUGAR FREE 64)  30 mL Per Tube TID   FLUoxetine  10 mg Oral Daily    levothyroxine  150 mcg Oral Q0600   mouth rinse  15 mL Mouth Rinse BID   metoCLOPramide (REGLAN) injection  10 mg Intravenous Q6H   metoprolol tartrate  25 mg Oral BID   montelukast  10 mg Oral QHS   multivitamin with minerals  1 tablet Oral Daily   pantoprazole sodium  40 mg Per Tube Daily   polyethylene glycol  17 g Per Tube Q M,W,F   sodium chloride flush  3 mL Intravenous Q12H   tamsulosin  0.4 mg Oral Daily   Continuous Infusions:  sodium chloride 10 mL/hr at 11/16/18 1500     LOS: 59 days    Time spent: over 30 min    Fayrene Helper, MD [facility] Pager AMION  If 7PM-7AM, please contact night-coverage [email or web address] Password [email or web address] 11/29/2018, 2:17 PM

## 2018-11-29 NOTE — Progress Notes (Signed)
Physical Therapy Treatment Patient Details Name: Audrey Collier MRN: [id number] DOB: Dec 26, 1960 Today's Date: 11/29/2018    History of Present Illness 58 yo female presented with fever, vomiting, weakness.  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  Echo showed vegetation.  Transferred to ICU 8/04 due to respiratory failure.  Underwent AVR with TEE intraoprative on 10/07/18. Extubated 10/08/18.  PEG placed 11/04/18.    PT Comments    Pt asleep on entry, however easily roused. Pt refuses to transfer to recliner but agrees to sitting EoB and doing therapeutic exercise. Pt requires modA for coming to the EoB. Pt able to sit with supervision and perform UE/LE therapeutic exercise. After sitting EoB for 8 minutes pt reports increased abdominal pain and requests return to bed. Pt requires maxA for management of LE back in bed and maxAx2 for positioning in bed. D/c plans remain appropriate. PT will continue to follow acutely.    Follow Up Recommendations  SNF;LTACH     Equipment Recommendations  Other (comment)(TBD)       Precautions / Restrictions Precautions Precautions: Fall;Sternal Restrictions Weight Bearing Restrictions: No    Mobility  Bed Mobility Overal bed mobility: Needs Assistance Bed Mobility: Supine to Sit Rolling: Max assist   Supine to sit: Mod assist Sit to supine: Max assist   General bed mobility comments: max multimodal cues but able to move LE off bed, reach both hands across to bedrail, requires modA for bringing trunk to upright, max A for bringing LE back into bed and centering in bed, maxA for rolling L&R for clean pad placement      Balance Overall balance assessment: Needs assistance Sitting-balance support: No upper extremity supported;Feet supported Sitting balance-Leahy Scale: Fair Sitting balance - Comments: pt able to maintain EOB balance with close supervision                                    Cognition Arousal/Alertness:  Awake/alert Behavior During Therapy: Flat affect Overall Cognitive Status: History of cognitive impairments - at baseline                                 General Comments: Very flat. Minimal verbal communications. Shakes head no to most questions.      Exercises General Exercises - Upper Extremity Elbow Flexion: AROM;Both;10 reps;Seated Elbow Extension: AROM;Both;10 reps;Seated General Exercises - Lower Extremity Ankle Circles/Pumps: AROM;Both;10 reps;Seated Long Arc Quad: AROM;Both;10 reps;Seated Hip ABduction/ADduction: AROM;Both;10 reps;Seated Hip Flexion/Marching: AROM;Both;10 reps;Seated    General Comments General comments (skin integrity, edema, etc.): VSS      Pertinent Vitals/Pain Pain Assessment: 0-10 Pain Score: 10-Worst pain ever Faces Pain Scale: Hurts a little bit Pain Location: abdomen Pain Descriptors / Indicators: Grimacing;Discomfort Pain Intervention(s): Limited activity within patient's tolerance;Monitored during session;Repositioned           PT Goals (current goals can now be found in the care plan section) Acute Rehab PT Goals Patient Stated Goal: not stated PT Goal Formulation: Patient unable to participate in goal setting Time For Goal Achievement: 12/08/18 Potential to Achieve Goals: Fair    Frequency    Min 2X/week      PT Plan Current plan remains appropriate;Frequency needs to be updated       AM-PAC PT "6 Clicks" Mobility   Outcome Measure  Help needed turning from your back to your side while  in a flat bed without using bedrails?: A Lot Help needed moving from lying on your back to sitting on the side of a flat bed without using bedrails?: A Lot Help needed moving to and from a bed to a chair (including a wheelchair)?: A Lot Help needed standing up from a chair using your arms (e.g., wheelchair or bedside chair)?: A Lot Help needed to walk in hospital room?: Total Help needed climbing 3-5 steps with a railing? :  Total 6 Click Score: 10    End of Session Equipment Utilized During Treatment: Oxygen Activity Tolerance: Patient limited by fatigue Patient left: with call bell/phone within reach;in bed;with bed alarm set Nurse Communication: Mobility status;Need for lift equipment(lift pad in chair) PT Visit Diagnosis: Muscle weakness (generalized) (M62.81);Other abnormalities of gait and mobility (R26.89)     Time: 4536-4680 PT Time Calculation (min) (ACUTE ONLY): 20 min  Charges:  $Therapeutic Exercise: 8-22 mins                     Derra Shartzer B. Migdalia Dk PT, DPT Acute Rehabilitation Services Pager [phone] Office [phone]    [place] 11/29/2018, 3:48 PM

## 2018-11-30 ENCOUNTER — Inpatient Hospital Stay (HOSPITAL_COMMUNITY): Payer: Medicaid Other

## 2018-11-30 LAB — COMPREHENSIVE METABOLIC PANEL
ALT: 35 U/L (ref 0–44)
AST: 44 U/L — ABNORMAL HIGH (ref 15–41)
Albumin: 2.1 g/dL — ABNORMAL LOW (ref 3.5–5.0)
Alkaline Phosphatase: 323 U/L — ABNORMAL HIGH (ref 38–126)
Anion gap: 13 (ref 5–15)
BUN: 83 mg/dL — ABNORMAL HIGH (ref 6–20)
CO2: 29 mmol/L (ref 22–32)
Calcium: 9 mg/dL (ref 8.9–10.3)
Chloride: 106 mmol/L (ref 98–111)
Creatinine, Ser: 2.46 mg/dL — ABNORMAL HIGH (ref 0.44–1.00)
GFR calc Af Amer: 24 mL/min — ABNORMAL LOW (ref >60)
GFR calc non Af Amer: 21 mL/min — ABNORMAL LOW (ref >60)
Glucose, Bld: 112 mg/dL — ABNORMAL HIGH (ref 70–99)
Potassium: 5.3 mmol/L — ABNORMAL HIGH (ref 3.5–5.1)
Sodium: 148 mmol/L — ABNORMAL HIGH (ref 135–145)
Total Bilirubin: 0.7 mg/dL (ref 0.3–1.2)
Total Protein: 5.6 g/dL — ABNORMAL LOW (ref 6.5–8.1)

## 2018-11-30 LAB — CBC
HCT: 28.1 % — ABNORMAL LOW (ref 36.0–46.0)
Hemoglobin: 8.4 g/dL — ABNORMAL LOW (ref 12.0–15.0)
MCH: 30.2 pg (ref 26.0–34.0)
MCHC: 29.9 g/dL — ABNORMAL LOW (ref 30.0–36.0)
MCV: 101.1 fL — ABNORMAL HIGH (ref 80.0–100.0)
Platelets: 78 10*3/uL — ABNORMAL LOW (ref 150–400)
RBC: 2.78 MIL/uL — ABNORMAL LOW (ref 3.87–5.11)
RDW: 16.4 % — ABNORMAL HIGH (ref 11.5–15.5)
WBC: 18.1 10*3/uL — ABNORMAL HIGH (ref 4.0–10.5)
nRBC: 0.1 % (ref 0.0–0.2)

## 2018-11-30 LAB — GLUCOSE, CAPILLARY
Glucose-Capillary: 115 mg/dL — ABNORMAL HIGH (ref 70–99)
Glucose-Capillary: 109 mg/dL — ABNORMAL HIGH (ref 70–99)

## 2018-11-30 LAB — MAGNESIUM: Magnesium: 2.4 mg/dL (ref 1.7–2.4)

## 2018-11-30 LAB — STREP PNEUMONIAE URINARY ANTIGEN: Strep Pneumo Urinary Antigen: NEGATIVE

## 2018-11-30 MED ORDER — SODIUM CHLORIDE 0.9 % IV SOLN
2.0000 g | INTRAVENOUS | Status: AC
  Administered 2018-11-30 – 2018-12-05 (×6): 2 g via INTRAVENOUS
  Filled 2018-11-30 (×6): qty 2

## 2018-11-30 MED ORDER — FLUOXETINE HCL 10 MG PO CAPS: 10.0000 mg | ORAL_CAPSULE | Freq: Every day | ORAL | Status: DC

## 2018-11-30 MED ORDER — AMLODIPINE BESYLATE 10 MG PO TABS: 10.0000 mg | ORAL_TABLET | Freq: Every day | ORAL | Status: DC

## 2018-11-30 MED ORDER — METOPROLOL TARTRATE 25 MG PO TABS
25.0000 mg | ORAL_TABLET | Freq: Two times a day (BID) | ORAL | Status: DC
  Administered 2018-11-30 – 2018-12-06 (×12): 25 mg
  Filled 2018-11-30 (×13): qty 1

## 2018-11-30 MED ORDER — ACETAMINOPHEN 325 MG PO TABS
650.0000 mg | ORAL_TABLET | Freq: Four times a day (QID) | ORAL | Status: DC | PRN
  Administered 2018-12-03 – 2018-12-06 (×5): 650 mg
  Filled 2018-11-30 (×5): qty 2

## 2018-11-30 MED ORDER — AMLODIPINE BESYLATE 10 MG PO TABS
10.0000 mg | ORAL_TABLET | Freq: Every day | ORAL | Status: DC
  Administered 2018-11-30 – 2018-12-06 (×7): 10 mg
  Filled 2018-11-30 (×7): qty 1

## 2018-11-30 MED ORDER — PROMETHAZINE HCL 25 MG RE SUPP
25.0000 mg | Freq: Four times a day (QID) | RECTAL | Status: DC | PRN
  Administered 2018-11-30 – 2018-12-06 (×2): 25 mg via RECTAL
  Filled 2018-11-30 (×4): qty 1

## 2018-11-30 MED ORDER — VANCOMYCIN HCL 10 G IV SOLR
1250.0000 mg | INTRAVENOUS | Status: DC
  Administered 2018-12-02: 1250 mg via INTRAVENOUS
  Filled 2018-11-30: qty 1250

## 2018-11-30 MED ORDER — VANCOMYCIN HCL 10 G IV SOLR
1750.0000 mg | Freq: Once | INTRAVENOUS | Status: AC
  Administered 2018-11-30: 1750 mg via INTRAVENOUS
  Filled 2018-11-30: qty 1750

## 2018-11-30 MED ORDER — FLUOXETINE HCL 20 MG/5ML PO SOLN
10.0000 mg | Freq: Every day | ORAL | Status: DC
  Administered 2018-11-30 – 2018-12-06 (×7): 10 mg
  Filled 2018-11-30 (×7): qty 5

## 2018-11-30 NOTE — Plan of Care (Signed)
Poc progressing.  

## 2018-11-30 NOTE — Progress Notes (Addendum)
      Audrey Collier       Audrey Collier,Audrey Collier [id number]             [phone]      14 Days Post-Op Procedure(s) (LRB): ESOPHAGOGASTRODUODENOSCOPY (EGD) WITH PROPOFOL (N/A) BIOPSY  Subjective: Patient vomited again late last evening and had desaturation afterward. She states she has a headache.  Objective: Vital signs in last 24 hours: Temp:  [97.7 F (36.5 C)-98.9 F (37.2 C)] 98.9 F (37.2 C) (09/30 0437) Pulse Rate:  [74-98] 97 (09/30 0800) Cardiac Rhythm: Normal sinus rhythm (09/30 0704) Resp:  [15-20] 18 (09/30 0800) BP: (110-161)/(80-97) 110/82 (09/30 0800) SpO2:  [92 %-100 %] 96 % (09/30 0800) Weight:  [88.4 kg] 88.4 kg (09/30 0437)  Intake/Output from previous day: 09/29 0701 - 09/30 0700 In: 180 [NG/GT:180] Out: 1401 [Urine:1200; Emesis/NG output:201]   Heart: RRR Lungs: Clear bilaterally Abdomen: Soft, protuberant, hyperactive bowel sounds this am Extremities: Trace LE edema Wound: Sternal wound is well healed  Lab Results: Recent Labs    11/29/18 0431 11/30/18 0333  WBC 13.2* 18.1*  HGB 8.0* 8.4*  HCT 28.8* 28.1*  PLT 84* 78*   BMET:  Recent Labs    11/29/18 0431 11/30/18 0333  NA 147* 148*  K 5.1 5.3*  CL 106 106  CO2 32 29  GLUCOSE 148* 112*  BUN 87* 83*  CREATININE 2.58* 2.46*  CALCIUM 8.8* 9.0    PT/INR:  No results for input(s): LABPROT, INR in the last 72 hours. ABG    Component Value Date/Time   PHART 7.430 10/08/2018 0920   HCO3 23.6 10/08/2018 0920   TCO2 25 10/08/2018 0920   ACIDBASEDEF 6.0 (H) 10/08/2018 0307   O2SAT 94.0 10/08/2018 0920   CBG (last 3)  Recent Labs    11/29/18 1112 11/29/18 2026 11/30/18 0435  GLUCAP 138* 141* 115*    Assessment/Plan: S/P Procedure(s) (LRB): ESOPHAGOGASTRODUODENOSCOPY (EGD) WITH PROPOFOL (N/A) BIOPSY  1. CV- She has been maintaining SR with HR in the 80's this am and is hypertensive. On Lopressor 25 mg bid and Amlodipine 10 mg daily. 2. Pulm- On 2 liters of oxygen  via [place]. Encourage incentive spirometer 3. AKI- creatinine slightly decreased to 2.46. S/p kidney biopsy. Per nephrology, no acute indication for dialysis; renal function appears to be stabilizing. 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 By her history it's from endocarditis and not much from the renal standpoint to do at this time. No immunomodulatory therapies. 4. GI- continues to have poor oral intake, on tube feeds at 45 ml/hr. She has severe hypoalbuminemia with anasarca. 5. Deconditioning PT/OT 6. Anemia-H and H this am stable at 8.4 and 28.1 7. Mild thrombocytopenia-platelets this am decreased to 78,000 8. Hypothyroidism-continue Levothyroxine 150 mcg daily 9. WBC slightly increased to 18,100. No fever, no wound infection. May be from aspiration. Will discuss if should put on antibiotics with Dr. Kipp Brood. 10. CBGs 138/141/115 11. Tylenol PRN headache  Nani Skillern PA-C [phone]  Agree with above Clinically stable Awaiting placement  Audrey Collier

## 2018-11-30 NOTE — Progress Notes (Signed)
Patient ID: Audrey Collier, female   DOB: 26-May-1960, 58 y.o.   MRN: [id number]  This NP visited patient at the bedside as a follow up for palliative medicine needs and emotional support.   Patient is awake and her Audrey Collier is feeding her lunch and she appears to be tolerating and enjoying the meal.  Audrey Collier her frustration and disatisfaction with overall situation. "She should be better by now", " I don't want  to be mean but someone needs to get this child better"  Active listening and emotional support offered.  We again reviewed the complex situation and the many active, current  efforts and treatments.     Audrey Collier was feeling positive about the possibility that  patient may have  a bed offer at SNF in [place], "that way I can feed her breakfast and lunch every day"   I placed call and spoke to [facility] Avis/SIL and main decision maker for  this patient ( patient does not have medical decisional capacity) by telephone.  We too reviewed current medical situation and treatment plan. She too questions if "they need a second opinion".  We discussed that is always a patient/family prerogative.    Questions and concerns addressed to the best of my ability.   I raised awareness to the patient's decreased motivation/ability  for participation in physical therapy.   Decreased mobility alone  increases risk for continued decline, muscle atrophy and pneumonia.    We discussed concept of  failure to thrive and the possibility that patient may not return to her previous physical and functional  baseline and the importance of consideration of their decisions are within the patients values and best interests.  PMT will continue to support holistically  Questions and concerns addressed   Discussed with [name] and LCSW  Total time spent on the unit was 60 minutes  Greater than 50% of the time was spent in counseling and coordination of care  Wadie Lessen NP  Palliative Medicine Team Team  Phone # [phone] Pager [phone]

## 2018-11-30 NOTE — Progress Notes (Signed)
Pharmacy Antibiotic Note  Audrey Collier is a 58 y.o. female admitted on 10/01/2018 now with HCAP  Pharmacy has been consulted for cefepime and vancomycin dosing. Patient with recent treatment for MSSA endocarditis and AKI.   Plan: Cefepime 2gm IV q24h Vancomycin 1750mg  IV x 1 then 1250mg  IV q48h Expected AUC 468 (goal 400-550), Scr used 2.46 Monitor renal function, LOT, Deescalation Suggest repeat MRSA PCR   Height: 5\' 2"  (157.5 cm) Weight: 194 lb 14.2 oz (88.4 kg) IBW/kg (Calculated) : 50.1  Temp (24hrs), Avg:98.6 F (37 C), Min:98 F (36.7 C), Max:98.9 F (37.2 C)  Recent Labs  Lab 11/26/18 0523 11/27/18 0501 11/28/18 0430 11/29/18 0431 11/30/18 0333  WBC 11.8* 11.6* 10.9* 13.2* 18.1*  CREATININE 2.92* 2.83* 2.70* 2.58* 2.46*    Estimated Creatinine Clearance: 26 mL/min (A) (by C-G formula based on SCr of 2.46 mg/dL (H)).    No Known Allergies  Zebulen Simonis A. Levada Dy, PharmD, BCPS, FNKF Clinical Pharmacist [place] Please utilize Amion for appropriate phone number to reach the unit pharmacist ([place])    11/30/2018 4:29 PM

## 2018-11-30 NOTE — Consult Note (Signed)
Audrey Collier [facility]  CONSULT PROGRESS NOTE    Audrey Collier  PIR:[id number] DOB: 02/17/1961 DOA: 10/01/2018 PCP: Jani Gravel, MD   Requesting physician: Dr. Kipp Brood  Reason for consultation: Med mgmt   Brief Narrative:   Audrey Collier is a 58 year old female with past medical history significant ofcognitive delay, anxiety, hypothyroidism, and post menopausal bleeding (worked up 5 years ago), whopresented on 8/1with altered mental status from group homewith fever up to 103.8 F and emesis. Chest x-ray showed bilateral pneumonia and patient was noted to be COVID-19 negative x2. Treated initially with Rocephin and azithromycin for suspected community-acquired pneumonia. Blood cultures grew out [facility] patient was switched to Ancef with rifampin added per ID. Patient required intubation on 8/4,due to respiratory failure and care was taken over by PCCM. On 8/6 patient underwent TEE showing large aortic vegetation. CT surgery was consulted,and she was taken for aortic valve replacement on 10/07/2018. Postop patient noted to have renal insufficiency and antibiotics were adjusted. Foley catheters had to be placed due to urinary retention. She had continued to complain of intermittent abdominal pain and nausea. Overall patient's oral intake had been minimal and therefore core track was placed with dietary consultation for tube feeds. GI was consulted and performed EGD which did not reveal any acute cause for patient's symptoms on 9/3. General surgery was consulted due to mildly distended gallbladder concerning for cholecystitis, but HIDA scan was negative. Due to lack of oral intake she underwent percutaneous gastric tube placement by IR on 9/4.Nephrology consulted after creatinine elevated to 3.48. They were concerned for likelyAIN vs post strep glomerulonephritis. On 9/7 patient developed projectile vomiting and hemoglobin dropped down to 6.8 requiring transfusion of PRBCs. Required transfer back into  the ICU for close monitoring and palliative care was consulted at that time where family made patient DNR/DNI. Patient completed antibiotics for endocarditis on 9/17. Patient developed low-grade fever on 9/18 up to 100.7 F. Urinalysis concerning for infection, but urine culture inconclusive. Labs on 9/22 show alkaline phosphatase 535, albumin 1.8, AST 433, ALT 77, and total bilirubin 0.7. TRH called help with overall medical management.  9/30: N/V ON. CT ordered.    Assessment & Plan:   Principal Problem:   Staphylococcus aureus bacteremia with sepsis ([place]) Active Problems:   Severe sepsis ([place])   Bacteremia due to methicillin susceptible Staphylococcus aureus (MSSA)   Aortic valve endocarditis   Acute respiratory failure with hypoxia ([name])   S/P AVR (aortic valve replacement)   Pressure injury of skin   Palliative care by specialist   Severe hypoalbuminemia with anasarca     - Patient still with poor appetite.      - Albumin levels around 1.8.      - Suspect patient likely third spacing due to poor nutritional status.     - tube feed per dietary  Acute kidney injury Mild Hypernatremia     - Patientwith a creatinine staying around 3 with decreased overall urine output.      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  Elevated Liver enzymes     - AST mildly elevated on 9/20 to 61, alk phos was 334 with normal bilirubin.       - 9/22 rose to AST 433 and alk phos 535 with ALT 77.  Downtrending since then.      - Unclear cause at this time.  Per GI c/s note from 9/23, suspected drug induced liver injury (concern for rifampin).     - Crestor has been discontinued        - Last abdominal ultrasound from 9/1 noted mildly distended gallbladder with 15 mm gallstone and small amount of sludge. HIDA scan was negative for signs of acute cholecystitis at that time.   Hypothyroidism     - Last TSH noted to be 19.826 on 10/30/2018.     - Recheck TSH(8.35 appears to be trending down appropriately)     - Continue levothyroxine  Anemia Throbocytopenia     - Stable.     - Hemoglobin 8.4 and platelets fluctuating.      - follow  Leukocytosis     - mild, stable, continue to monitor     - reactive to vomiting?     - CT ab/pelvis sees opacities in LLL PNA; HAP? would need HAP coverage, consult pharm for dosing  Cognitive delay     - Stable. Patient has been in a group home previous  Nausea Vomiting     - this has been intermittent.       - Continue reglan.     - repeat CT abdomen pelvis: without any specific GI abnormality     - add PR phenergan  LLL PNA seen on CT ab/pelvis. Check CXR. Consult pharm for HCAP abx dosing. Add PR phenergan.   ROS:  Unable to obtain d/t mentation  Subjective: N/V ON per nursing.   Objective: Vitals:   11/30/18 0437 11/30/18 0800 11/30/18 1153 11/30/18 1200  BP: (!) 161/90 110/82 (!) 158/94 (!) 153/96  Pulse: 94 97 96 95  Resp: 20 18 (!) 24 (!) 26  Temp: 98.9 F (37.2 C)  98.8 F (37.1 C)   TempSrc: Oral  Oral   SpO2: 94% 96% 97% 95%  Weight: 88.4 kg     Height:        Intake/Output Summary (Last 24 hours) at 11/30/2018 1607 Last data filed at 11/30/2018 6294 Gross per 24 hour  Intake 213 ml  Output 700 ml  Net -487 ml   Filed Weights   11/28/18 0436 11/29/18 0500 11/30/18 0437  Weight: 88.9 kg 88.3 kg 88.4 kg    Examination:  General: 58 y.o. female resting in bed in NAD Cardiovascular: RRR, +S1, S2, no m/g/r, equal pulses  throughout Respiratory: CTABL, no w/r/r, normal WOB GI: BS+, NDNT, no masses noted, no organomegaly noted MSK: No e/c/c Skin: No rashes, bruises, ulcerations noted Neuro: tracks around room, follows some commands   Data Reviewed: I have personally reviewed following labs and imaging studies.  CBC: Recent Labs  Lab 11/26/18 0523 11/27/18 0501 11/28/18 0430 11/29/18 0431 11/30/18 0333  WBC 11.8* 11.6* 10.9* 13.2* 18.1*  HGB 8.4* 8.3* 8.2* 8.0* 8.4*  HCT 28.1* 27.5* 28.0* 28.8* 28.1*  MCV 100.0 100.4* 101.4* 103.2* 101.1*  PLT 97* 95* 85* 84* 78*   Basic Metabolic Panel: Recent Labs  Lab 11/26/18 0523 11/27/18 0501 11/28/18 0430 11/29/18 0431 11/30/18 0333  NA 144 144 144  147* 148*  K 4.7 4.7 4.8 5.1 5.3*  CL 105 104 105 106 106  CO2 30 31 32 32 29  GLUCOSE 146* 124* 132* 148* 112*  BUN 102* 101* 96* 87* 83*  CREATININE 2.92* 2.83* 2.70* 2.58* 2.46*  CALCIUM 8.6* 8.7* 8.5* 8.8* 9.0  MG 2.6* 2.5*  --  2.5* 2.4   GFR: Estimated Creatinine Clearance: 26 mL/min (A) (by C-G formula based on SCr of 2.46 mg/dL (H)). Liver Function Tests: Recent Labs  Lab 11/25/18 0431 11/26/18 0523 11/27/18 0501 11/28/18 0430 11/30/18 0333  AST 91* 51* 37 28 44*  ALT 64* 46* 36 29 35  ALKPHOS 497* 427* 352* 338* 323*  BILITOT 0.6 0.7 0.7 0.5 0.7  PROT 5.5* 5.5* 5.2* 5.3* 5.6*  ALBUMIN 2.0* 1.9* 1.9* 1.9* 2.1*   No results for input(s): LIPASE, AMYLASE in the last 168 hours. No results for input(s): AMMONIA in the last 168 hours. Coagulation Profile: Recent Labs  Lab 11/26/18 0523  INR 1.2   Cardiac Enzymes: No results for input(s): CKTOTAL, CKMB, CKMBINDEX, TROPONINI in the last 168 hours. BNP (last 3 results) No results for input(s): PROBNP in the last 8760 hours. HbA1C: No results for input(s): HGBA1C in the last 72 hours. CBG: Recent Labs  Lab 11/29/18 0403 11/29/18 1112 11/29/18 2026 11/30/18 0435 11/30/18 1204  GLUCAP 131* 138* 141* 115* 109*   Lipid  Profile: No results for input(s): CHOL, HDL, LDLCALC, TRIG, CHOLHDL, LDLDIRECT in the last 72 hours. Thyroid Function Tests: No results for input(s): TSH, T4TOTAL, FREET4, T3FREE, THYROIDAB in the last 72 hours. Anemia Panel: No results for input(s): VITAMINB12, FOLATE, FERRITIN, TIBC, IRON, RETICCTPCT in the last 72 hours. Sepsis Labs: No results for input(s): PROCALCITON, LATICACIDVEN in the last 168 hours.  No results found for this or any previous visit (from the past 240 hour(s)).    Radiology Studies: Ct Abdomen Pelvis Wo Contrast  Result Date: 11/30/2018 CLINICAL DATA:  Nausea, vomiting EXAM: CT ABDOMEN AND PELVIS WITHOUT CONTRAST TECHNIQUE: Multidetector CT imaging of the abdomen and pelvis was performed following the standard protocol without IV contrast. Sagittal and coronal MPR images reconstructed from axial data set. Dilute oral contrast administered COMPARISON:  11/07/2018 FINDINGS: Lower chest: Small to moderate RIGHT pleural effusion. Compressive atelectasis of RIGHT lower lobe. Additional airspace infiltrates throughout LEFT lower lobe and minimally in RIGHT lower lobe. Hepatobiliary: Gallbladder and liver unremarkable Pancreas: Partial fatty replacement. No definite focal abnormalities Spleen: Normal appearance Adrenals/Urinary Tract: Normal appearance of adrenal glands. Kidneys and ureters unremarkable. Mild bladder wall thickening though bladder is underdistended, potentially artifactual. Stomach/Bowel: Gastrostomy tube. Appendix not visualized. Stomach and bowel loops otherwise normal appearance. Vascular/Lymphatic: Aorta normal caliber. Retroaortic LEFT renal vein. No adenopathy Reproductive: Atrophic uterus with unremarkable adnexa Other: Scattered ascites throughout abdomen and pelvis. No free air. Tiny umbilical hernia containing fat. Scattered subcutaneous edema. No focal abnormal fluid collection to suggest abscess. Musculoskeletal: Unremarkable IMPRESSION: Scattered  ascites. Tiny umbilical hernia containing fat. RIGHT pleural effusion with basilar atelectasis and significant LEFT lower lobe consolidation/pneumonia. Electronically Signed   By: Lavonia Dana M.D.   On: 11/30/2018 15:27     Scheduled Meds:  sodium chloride   Intravenous Once   amLODipine  10 mg Per Tube Daily   calcium-vitamin D  1 tablet Oral Q breakfast   Chlorhexidine Gluconate Cloth  6 each Topical Daily   feeding supplement (OSMOLITE 1.5 CAL)  1,000 mL Per Tube Q24H   feeding supplement (PRO-STAT SUGAR FREE 64)  30 mL Per Tube TID   FLUoxetine  10 mg Per Tube Daily   free water  200 mL Per Tube Q8H   levothyroxine  150 mcg Oral Q0600   mouth rinse  15 mL Mouth Rinse BID   metoCLOPramide (REGLAN) injection  10 mg Intravenous Q6H   metoprolol tartrate  25 mg Per Tube BID   montelukast  10 mg Oral QHS   multivitamin with minerals  1 tablet Oral Daily   pantoprazole sodium  40 mg Per Tube Daily   polyethylene glycol  17 g Per Tube Q M,W,F   sodium chloride flush  3 mL Intravenous Q12H   tamsulosin  0.4 mg Oral Daily   Continuous Infusions:  sodium chloride 10 mL/hr at 11/16/18 1500     LOS: 60 days   Thank you for this consult. TRH will continue to follow.  Time spent: 35 minutes including family counseling.    Jonnie Finner, DO [facility] Pager [phone]  If 7PM-7AM, please contact night-coverage [email or web address] Password [email or web address] 11/30/2018, 4:07 PM

## 2018-11-30 NOTE — Progress Notes (Signed)
Pt had been throwing up x 2. On call NP Tylene Fantasia paged at [phone].  [phone]: Tylene Fantasia NP called back notified her pt has been throwing up which looks like tube feeding or bowel with some pink tinged mucus. I had turn the tube feed off. I told her I gave pt iv Zofran and her pm meds but she vomited all of that too. Pt was supposed to have CT scan of abdomen with oral contrast, NP told me to hold it till am and hold tube feeding till am. I called CT and talked to [facility] and notified pt wont be able to hold oral contrast and move CT for am. Pt de-sated to 60's after throwing up, now on 5l o2 via [place]. o2 sat 95%. Call bell within reach. Will continue to monitor.

## 2018-12-01 DIAGNOSIS — R627 Adult failure to thrive: Secondary | ICD-10-CM

## 2018-12-01 DIAGNOSIS — R531 Weakness: Secondary | ICD-10-CM

## 2018-12-01 LAB — RENAL FUNCTION PANEL
Albumin: 2.1 g/dL — ABNORMAL LOW (ref 3.5–5.0)
Anion gap: 8 (ref 5–15)
BUN: 78 mg/dL — ABNORMAL HIGH (ref 6–20)
CO2: 32 mmol/L (ref 22–32)
Calcium: 9.2 mg/dL (ref 8.9–10.3)
Chloride: 110 mmol/L (ref 98–111)
Creatinine, Ser: 2.28 mg/dL — ABNORMAL HIGH (ref 0.44–1.00)
GFR calc Af Amer: 27 mL/min — ABNORMAL LOW (ref >60)
GFR calc non Af Amer: 23 mL/min — ABNORMAL LOW (ref >60)
Glucose, Bld: 95 mg/dL (ref 70–99)
Phosphorus: 5.4 mg/dL — ABNORMAL HIGH (ref 2.5–4.6)
Potassium: 4.8 mmol/L (ref 3.5–5.1)
Sodium: 150 mmol/L — ABNORMAL HIGH (ref 135–145)

## 2018-12-01 LAB — CBC WITH DIFFERENTIAL/PLATELET
Abs Immature Granulocytes: 0.21 10*3/uL — ABNORMAL HIGH (ref 0.00–0.07)
Basophils Absolute: 0.1 10*3/uL (ref 0.0–0.1)
Basophils Relative: 1 %
Eosinophils Absolute: 1.1 10*3/uL — ABNORMAL HIGH (ref 0.0–0.5)
Eosinophils Relative: 8 %
HCT: 26.4 % — ABNORMAL LOW (ref 36.0–46.0)
Hemoglobin: 7.9 g/dL — ABNORMAL LOW (ref 12.0–15.0)
Immature Granulocytes: 2 %
Lymphocytes Relative: 7 %
Lymphs Abs: 0.9 10*3/uL (ref 0.7–4.0)
MCH: 30.4 pg (ref 26.0–34.0)
MCHC: 29.9 g/dL — ABNORMAL LOW (ref 30.0–36.0)
MCV: 101.5 fL — ABNORMAL HIGH (ref 80.0–100.0)
Monocytes Absolute: 0.6 10*3/uL (ref 0.1–1.0)
Monocytes Relative: 5 %
Neutro Abs: 10.5 10*3/uL — ABNORMAL HIGH (ref 1.7–7.7)
Neutrophils Relative %: 77 %
Platelets: 91 10*3/uL — ABNORMAL LOW (ref 150–400)
RBC: 2.6 MIL/uL — ABNORMAL LOW (ref 3.87–5.11)
RDW: 16.5 % — ABNORMAL HIGH (ref 11.5–15.5)
WBC: 13.4 10*3/uL — ABNORMAL HIGH (ref 4.0–10.5)
nRBC: 0.1 % (ref 0.0–0.2)

## 2018-12-01 LAB — GLUCOSE, CAPILLARY
Glucose-Capillary: 96 mg/dL (ref 70–99)
Glucose-Capillary: 95 mg/dL (ref 70–99)
Glucose-Capillary: 84 mg/dL (ref 70–99)

## 2018-12-01 LAB — MAGNESIUM: Magnesium: 2.6 mg/dL — ABNORMAL HIGH (ref 1.7–2.4)

## 2018-12-01 MED ORDER — OSMOLITE 1.5 CAL PO LIQD
1000.0000 mL | ORAL | Status: DC
  Administered 2018-12-01 – 2018-12-06 (×3): 1000 mL
  Filled 2018-12-01 (×9): qty 1000

## 2018-12-01 NOTE — Progress Notes (Signed)
Nutrition Follow-up  DOCUMENTATION CODES:   Not applicable  INTERVENTION:   -Consider adding scheduled Zofran -Consider advancing PEG to J-tube if vomiting continues   Start tube feeding:  - Osmolite 1.5 @ 20 ml/hr via PEG - Increase by 10 ml Q8 hours to goal rate of 45 ml/hr (1080 ml) - 30 ml Prostat TID  - Free water flushes 200 ml Q8 hours- per internal med  At goal TF provides: 1920 kcals, 113 grams protein, 823 ml free water (1423 ml with flushes). Meets 100% of needs.   NUTRITION DIAGNOSIS:   Inadequate oral intake related to poor appetite as evidenced by meal completion < 50%.  Ongoing  GOAL:   Patient will meet greater than or equal to 90% of their needs  Addressed via TF  MONITOR:   PO intake, Supplement acceptance, Diet advancement, TF tolerance, Weight trends, Labs, I & O's, Skin  REASON FOR ASSESSMENT:   Consult Enteral/tube feeding initiation and management(nocturnal)  ASSESSMENT:   58 yo female admitted with fever, vomiting, weakness. Found to have MSSA bacteremia with PNA. Echo showed vegetation. Required transfer to the ICU and intubation on 8/4. PMH includes developmental delay, GERD, anxiety, hypothyroidism, HLD.   8/6- TEE showing large aortic valve vegetation 8/7- s/p aortic valve replacement 8/8- extubated, diet advanced to Dysphagia 2 8/21- Cortrak placement(tip in stomach); spoke with TCTS, ok to start TF 9/1- HIDA negative 9/3- Cortrak pulled during EGD 9/4- s/p PEG placement 9/7- PEG to gravity, CT abdomen negative 9/8- clear liquids, multiple episodes vomiting 9/23- diet advanced to regular 9/29- TF turned off due to vomiting   Tube feeding remains off this am. CT abdomen negative yesterday. Per RN pt tolerated morning medications.  Receiving 10 ml Reglan Q6 hours. Zofran given PRN. Now with hypernatremia- free water flushes added (could increase once TF volume tolerated). Will restart TF today and titrate to goal. RN made aware of  plan.   Admission weight: 77.7 kg Current weight: 83.9 kg   Pt with +3 upper extremity edema and +3 lower extremity edema- no improvements.   I/O: -155 ml since 9/17 UOP: 450 ml x 24 hrs   Medications: oscal with D, reglan, MVI, miralax Labs: Na 150 (H) Cr 2.28- trending down Phosphorus 5.4 (H) Mg 2.6 (H) CBG 84-141   Diet Order:   Diet Order            Diet regular Room service appropriate? Yes; Fluid consistency: Thin  Diet effective now              EDUCATION NEEDS:   No education needs have been identified at this time  Skin:  Skin Assessment: Skin Integrity Issues: Skin Integrity Issues:: Stage II, Incisions DTI: R ear Stage II: R buttocks Incisions: chest Other: MASD-arm, buttocks, labia  Last BM:  10/1  Height:   Ht Readings from Last 1 Encounters:  10/07/18 5\' 2"  (1.575 m)    Weight:   Wt Readings from Last 1 Encounters:  12/01/18 83.9 kg    Ideal Body Weight:  56.8 kg  BMI:  Body mass index is 33.83 kg/m.  Estimated Nutritional Needs:   Kcal:  1800-2000  Protein:  100-115 gm  Fluid:  >/= 1.8 L   Mariana Single RD, LDN Clinical Nutrition Pager # [phone]

## 2018-12-01 NOTE — Progress Notes (Signed)
Audrey Collier  PROGRESS NOTE    Audrey Collier  VEL:[id number] DOB: 08-02-1960 DOA: 10/01/2018 PCP: Jani Gravel, MD   Brief Narrative:   Audrey Collier is a 58 year old female with past medical history significant ofcognitive delay, anxiety, hypothyroidism, and post menopausal bleeding (worked up 5 years ago), whopresented on 8/1with altered mental status from group homewith fever up to 103.8 F and emesis. Chest x-ray showed bilateral pneumonia and patient was noted to be COVID-19 negative x2. Treated initially with Rocephin and azithromycin for suspected community-acquired pneumonia. Blood cultures grew out [facility] patient was switched to Ancef with rifampin added per ID. Patient required intubation on 8/4,due to respiratory failure and care was taken over by PCCM. On 8/6 patient underwent TEE showing large aortic vegetation. CT surgery was consulted,and she was taken for aortic valve replacement on 10/07/2018. Postop patient noted to have renal insufficiency and antibiotics were adjusted. Foley catheters had to be placed due to urinary retention. She had continued to complain of intermittent abdominal pain and nausea. Overall patient's oral intake had been minimal and therefore core track was placed with dietary consultation for tube feeds. GI was consulted and performed EGD which did not reveal any acute cause for patient's symptoms on 9/3. General surgery was consulted due to mildly distended gallbladder concerning for cholecystitis, but HIDA scan was negative. Due to lack of oral intake she underwent percutaneous gastric tube placement by IR on 9/4.Nephrology consulted after creatinine elevated to 3.48. They were concerned for likelyAIN vs post strep glomerulonephritis. On 9/7 patient developed projectile vomiting and hemoglobin dropped down to 6.8 requiring transfusion of PRBCs. Required transfer back into the ICU for close monitoring and palliative care was consulted at that time where family  made patient DNR/DNI. Patient completed antibiotics for endocarditis on 9/17. Patient developed low-grade fever on 9/18 up to 100.7 F. Urinalysis concerning for infection, but urine culture inconclusive. Labs on 9/22 show alkaline phosphatase 535, albumin 1.8, AST 433, ALT 77, and total bilirubin 0.7. TRH called help with overall medical management.  9/30: N/V ON. CT ordered.  10/1: N/V improved. TFs held. Start back and slow rate today.   Assessment & Plan:   Principal Problem:   Staphylococcus aureus bacteremia with sepsis ([place]) Active Problems:   Severe sepsis ([place])   Bacteremia due to methicillin susceptible Staphylococcus aureus (MSSA)   Aortic valve endocarditis   Acute respiratory failure with hypoxia ([name])   S/P AVR (aortic valve replacement)   Pressure injury of skin   Palliative care by specialist   Severe hypoalbuminemia with anasarca     - Patient still with poor appetite.      - Albumin levels around 1.8.      - Suspect patient likely third spacing due to poor nutritional status.     - tube feed per dietary  Acute kidney injury Mild Hypernatremia     - Patientwith a creatinine staying around 3 with decreased overall urine output.      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  Elevated Liver enzymes     - AST mildly elevated on 9/20 to 61, alk phos was 334 with normal bilirubin.       - 9/22 rose to AST 433 and alk phos 535 with ALT 77.  Downtrending since then.     - Unclear cause  at this time.  Per GI c/s note from 9/23, suspected drug induced liver injury (concern for rifampin).     - Crestor has been discontinued        - Last abdominal ultrasound from 9/1 noted mildly distended gallbladder with 15 mm gallstone and small amount of sludge. HIDA scan was negative for signs of acute cholecystitis at that time.   Hypothyroidism     - Last TSH noted to be 19.826 on 10/30/2018.     - Recheck TSH(8.35 appears to be trending down appropriately)     - Continue levothyroxine  Anemia Throbocytopenia     - Stable.     - Hemoglobin 7.9 and platelets fluctuating.      - follow  Leukocytosis     - mild, stable, continue to monitor     - reactive to vomiting?     - CT ab/pelvis sees opacities in LLL PNA; HAP? would need HAP coverage, consult pharm for dosing     - started treating HAP; white count improved; decreased N/V  Cognitive delay     - Stable. Patient has been in a group home previous  Nausea Vomiting     - this has been intermittent.       - Continue reglan.     - repeat CT abdomen pelvis: without any specific GI abnormality     - add PR phenergan     - improved today; slowly resume TF  Will take over from TCTS. N/V improved ON per nursing. Continue abx for PNA. Limited to 5 day course.   DVT prophylaxis: SCDs Code Status: DNR   Disposition Plan: TBD  Consultants:   TCTS  Antimicrobials:   Cefepime/Vanc  ROS:  Unable to obtain d/t mentation  Subjective: Improved N/V ON per nursing.   Objective: Vitals:   12/01/18 0426 12/01/18 0435 12/01/18 0842 12/01/18 1135  BP: (!) 153/91  (!) 161/93 (!) 145/87  Pulse: 84  78 87  Resp: 17  17   Temp: 98.8 F (37.1 C)  98.4 F (36.9 C)   TempSrc: Oral  Oral   SpO2: 91%  100%   Weight:  83.9 kg    Height:        Intake/Output Summary (Last 24 hours) at 12/01/2018 1427 Last data filed at 11/30/2018 1636 Gross per 24 hour  Intake --  Output 450 ml  Net -450 ml   Filed Weights    11/29/18 0500 11/30/18 0437 12/01/18 0435  Weight: 88.3 kg 88.4 kg 83.9 kg    Examination:  General: 58 y.o. female resting in bed in NAD Cardiovascular: RRR, +S1, S2, no m/g/r, equal pulses throughout Respiratory: CTABL, no w/r/r, normal WOB GI: BS+, NDNT, no masses noted, no organomegaly noted MSK: No e/c/c Skin: No rashes, bruises, ulcerations noted Neuro: tracks around room, follows some commands, nods yes to some questions.    Data Reviewed: I have personally reviewed following labs and imaging studies.  CBC: Recent Labs  Lab 11/27/18 0501 11/28/18 0430 11/29/18 0431 11/30/18 0333 12/01/18 0448  WBC 11.6* 10.9* 13.2* 18.1* 13.4*  NEUTROABS  --   --   --   --  10.5*  HGB 8.3* 8.2* 8.0* 8.4* 7.9*  HCT 27.5* 28.0* 28.8* 28.1* 26.4*  MCV 100.4* 101.4* 103.2* 101.1* 101.5*  PLT 95* 85* 84* 78* 91*   Basic Metabolic Panel: Recent Labs  Lab 11/26/18 0523 11/27/18 0501 11/28/18 0430 11/29/18 0431 11/30/18 0333 12/01/18 0448  NA 144 144 144 147* 148* 150*  K 4.7 4.7 4.8 5.1 5.3* 4.8  CL 105 104 105 106 106 110  CO2 30 31 32 32 29 32  GLUCOSE 146* 124* 132* 148* 112* 95  BUN 102* 101* 96* 87* 83* 78*  CREATININE 2.92* 2.83* 2.70* 2.58* 2.46* 2.28*  CALCIUM 8.6* 8.7* 8.5* 8.8* 9.0 9.2  MG 2.6* 2.5*  --  2.5* 2.4 2.6*  PHOS  --   --   --   --   --  5.4*   GFR: Estimated Creatinine Clearance: 27.3 mL/min (A) (by C-G formula based on SCr of 2.28 mg/dL (H)). Liver Function Tests: Recent Labs  Lab 11/25/18 0431 11/26/18 0523 11/27/18 0501 11/28/18 0430 11/30/18 0333 12/01/18 0448  AST 91* 51* 37 28 44*  --   ALT 64* 46* 36 29 35  --   ALKPHOS 497* 427* 352* 338* 323*  --   BILITOT 0.6 0.7 0.7 0.5 0.7  --   PROT 5.5* 5.5* 5.2* 5.3* 5.6*  --   ALBUMIN 2.0* 1.9* 1.9* 1.9* 2.1* 2.1*   No results for input(s): LIPASE, AMYLASE in the last 168 hours. No results for input(s): AMMONIA in the last 168 hours. Coagulation Profile: Recent Labs  Lab 11/26/18 0523    INR 1.2   Cardiac Enzymes: No results for input(s): CKTOTAL, CKMB, CKMBINDEX, TROPONINI in the last 168 hours. BNP (last 3 results) No results for input(s): PROBNP in the last 8760 hours. HbA1C: No results for input(s): HGBA1C in the last 72 hours. CBG: Recent Labs  Lab 11/29/18 1112 11/29/18 2026 11/30/18 0435 11/30/18 1204 12/01/18 1154  GLUCAP 138* 141* 115* 109* 84   Lipid Profile: No results for input(s): CHOL, HDL, LDLCALC, TRIG, CHOLHDL, LDLDIRECT in the last 72 hours. Thyroid Function Tests: No results for input(s): TSH, T4TOTAL, FREET4, T3FREE, THYROIDAB in the last 72 hours. Anemia Panel: No results for input(s): VITAMINB12, FOLATE, FERRITIN, TIBC, IRON, RETICCTPCT in the last 72 hours. Sepsis Labs: No results for input(s): PROCALCITON, LATICACIDVEN in the last 168 hours.  No results found for this or any previous visit (from the past 240 hour(s)).    Radiology Studies: Ct Abdomen Pelvis Wo Contrast  Result Date: 11/30/2018 CLINICAL DATA:  Nausea, vomiting EXAM: CT ABDOMEN AND PELVIS WITHOUT CONTRAST TECHNIQUE: Multidetector CT imaging of the abdomen and pelvis was performed following the standard protocol without IV contrast. Sagittal and coronal MPR images reconstructed from axial data set. Dilute oral contrast administered COMPARISON:  11/07/2018 FINDINGS: Lower chest: Small to moderate RIGHT pleural effusion. Compressive atelectasis of RIGHT lower lobe. Additional airspace infiltrates throughout LEFT lower lobe and minimally in RIGHT lower lobe. Hepatobiliary: Gallbladder and liver unremarkable Pancreas: Partial fatty replacement. No definite focal abnormalities Spleen: Normal appearance Adrenals/Urinary Tract: Normal appearance of adrenal glands. Kidneys and ureters unremarkable. Mild bladder wall thickening though bladder is underdistended, potentially artifactual. Stomach/Bowel: Gastrostomy tube. Appendix not visualized. Stomach and bowel loops otherwise normal  appearance. Vascular/Lymphatic: Aorta normal caliber. Retroaortic LEFT renal vein. No adenopathy Reproductive: Atrophic uterus with unremarkable adnexa Other: Scattered ascites throughout abdomen and pelvis. No free air. Tiny umbilical hernia containing fat. Scattered subcutaneous edema. No focal abnormal fluid collection to suggest  abscess. Musculoskeletal: Unremarkable IMPRESSION: Scattered ascites. Tiny umbilical hernia containing fat. RIGHT pleural effusion with basilar atelectasis and significant LEFT lower lobe consolidation/pneumonia. Electronically Signed   By: Lavonia Dana M.D.   On: 11/30/2018 15:27   Dg Chest Port 1 View  Result Date: 11/30/2018 CLINICAL DATA:  Pneumonia. EXAM: PORTABLE CHEST 1 VIEW COMPARISON:  11/22/2018 FINDINGS: Heart size remains enlarged. Left-sided PICC line terminates in upper right atrium. Signs of median sternotomy for aortic valve replacement. Graded opacity in the right chest and diffuse interstitial and airspace opacities worse on the left, worse than on the study of 11/22/2018. IMPRESSION: Worsening right effusion and left greater than right airspace disease/multifocal pneumonia in the chest compared to 11/22/2018, lung bases better characterized on recent abdominal CT. Electronically Signed   By: Zetta Bills M.D.   On: 11/30/2018 16:56     Scheduled Meds:  amLODipine  10 mg Per Tube Daily   calcium-vitamin D  1 tablet Oral Q breakfast   Chlorhexidine Gluconate Cloth  6 each Topical Daily   feeding supplement (PRO-STAT SUGAR FREE 64)  30 mL Per Tube TID   FLUoxetine  10 mg Per Tube Daily   free water  200 mL Per Tube Q8H   levothyroxine  150 mcg Oral Q0600   mouth rinse  15 mL Mouth Rinse BID   metoCLOPramide (REGLAN) injection  10 mg Intravenous Q6H   metoprolol tartrate  25 mg Per Tube BID   montelukast  10 mg Oral QHS   multivitamin with minerals  1 tablet Oral Daily   pantoprazole sodium  40 mg Per Tube Daily   polyethylene glycol   17 g Per Tube Q M,W,F   sodium chloride flush  3 mL Intravenous Q12H   tamsulosin  0.4 mg Oral Daily   Continuous Infusions:  sodium chloride 500 mL (11/30/18 1741)   ceFEPime (MAXIPIME) IV 2 g (11/30/18 1743)   feeding supplement (OSMOLITE 1.5 CAL)     [START ON 12/02/2018] vancomycin       LOS: 61 days    Time spent: 25 minutes spent in the coordination of care today.    Jonnie Finner, DO [facility] Pager [phone]  If 7PM-7AM, please contact night-coverage [email or web address] Password [email or web address] 12/01/2018, 2:27 PM

## 2018-12-01 NOTE — Progress Notes (Signed)
      [address]       [place] [address]             [phone]      14 Days Post-Op Procedure(s) (LRB): ESOPHAGOGASTRODUODENOSCOPY (EGD) WITH PROPOFOL (N/A) BIOPSY  Subjective: Patient states "I just want to go to sleep".  Objective: Vital signs in last 24 hours: Temp:  [97.6 F (36.4 C)-98.8 F (37.1 C)] 98.8 F (37.1 C) (10/01 0426) Pulse Rate:  [79-98] 84 (10/01 0426) Cardiac Rhythm: Normal sinus rhythm (10/01 0736) Resp:  [17-26] 17 (10/01 0426) BP: (145-165)/(81-96) 153/91 (10/01 0426) SpO2:  [87 %-97 %] 91 % (10/01 0426) Weight:  [83.9 kg] 83.9 kg (10/01 0435)  Intake/Output from previous day: 09/30 0701 - 10/01 0700 In: 33 [I.V.:3] Out: 450 [Urine:450]   Heart: RRR Lungs: Clear bilaterally Abdomen: Soft, protuberant, hyperactive bowel sounds this am Extremities: Trace LE edema Wound: Sternal wound is well healed  Lab Results: Recent Labs    11/30/18 0333 12/01/18 0448  WBC 18.1* 13.4*  HGB 8.4* 7.9*  HCT 28.1* 26.4*  PLT 78* 91*   BMET:  Recent Labs    11/30/18 0333 12/01/18 0448  NA 148* 150*  K 5.3* 4.8  CL 106 110  CO2 29 32  GLUCOSE 112* 95  BUN 83* 78*  CREATININE 2.46* 2.28*  CALCIUM 9.0 9.2    PT/INR:  No results for input(s): LABPROT, INR in the last 72 hours. ABG    Component Value Date/Time   PHART 7.430 10/08/2018 0920   HCO3 23.6 10/08/2018 0920   TCO2 25 10/08/2018 0920   ACIDBASEDEF 6.0 (H) 10/08/2018 0307   O2SAT 94.0 10/08/2018 0920   CBG (last 3)  Recent Labs    11/29/18 2026 11/30/18 0435 11/30/18 1204  GLUCAP 141* 115* 109*    Assessment/Plan: S/P Procedure(s) (LRB): ESOPHAGOGASTRODUODENOSCOPY (EGD) WITH PROPOFOL (N/A) BIOPSY  1. CV- She has been maintaining SR with HR in the 80's this am and is hypertensive. On Lopressor 25 mg bid and Amlodipine 10 mg daily. 2. Pulm- On 2 liters of oxygen via [place]. Encourage incentive spirometer 3. AKI- creatinine further decreased to 2.28. S/p  kidney biopsy. Per nephrology, no acute indication for dialysis; renal function appears to be stabilizing. 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 By her history it's from endocarditis and not much from the renal standpoint to do at this time. No immunomodulatory therapies. 4. GI- continues to have poor oral intake, on tube feeds at 45 ml/hr. She has severe hypoalbuminemia with anasarca. 5. Deconditioning PT/OT 6. Anemia-H and H this am stable at 7.9 and 26.4 7. Mild thrombocytopenia-platelets this am slightly increased to 91,000 8. Hypothyroidism-continue Levothyroxine 150 mcg daily 9. WBC slightly increased to 13,400. No fever, no wound infection. May be from aspiration. On Vancomycin and Maxipime 10. CBGs 141/115/109   Nani Skillern PA-C [phone]

## 2018-12-01 NOTE — TOC Progression Note (Addendum)
Transition of Care [facility]) - Progression Note    Patient Details  Name: Audrey Collier MRN: [id number] Date of Birth: Dec 17, 1960  Transition of Care [facility]) CM/SW [place], [place] Phone Number: 12/01/2018, 4:42 PM  Clinical Narrative:     CSW informed family of possible placement at [facility] in [place]. [facility] will confirm bed offer tomorrow. CSW has informed the family and is agreeable to SNF placement once the patient is medically stable for discharge.  CSW will continue to follow and assist with discharge planning.   [name], MSW, [facility] Clinical Social Worker [phone]    Expected Discharge Plan: [place] Barriers to Discharge: No SNF bed, Continued Medical Work up  Expected Discharge Plan and Services Expected Discharge Plan: [place] In-house Referral: Clinical Social Work Discharge Planning Services: NA Post Acute Care Choice: [place] Living arrangements for the past 2 months: Single Family Home                 DME Arranged: N/A DME Agency: NA                   Social Determinants of Health (SDOH) Interventions    Readmission Risk Interventions No flowsheet data found.

## 2018-12-02 DIAGNOSIS — R627 Adult failure to thrive: Secondary | ICD-10-CM

## 2018-12-02 DIAGNOSIS — R531 Weakness: Secondary | ICD-10-CM

## 2018-12-02 LAB — RENAL FUNCTION PANEL
Albumin: 2.1 g/dL — ABNORMAL LOW (ref 3.5–5.0)
Anion gap: 10 (ref 5–15)
BUN: 81 mg/dL — ABNORMAL HIGH (ref 6–20)
CO2: 31 mmol/L (ref 22–32)
Calcium: 9 mg/dL (ref 8.9–10.3)
Chloride: 106 mmol/L (ref 98–111)
Creatinine, Ser: 2.28 mg/dL — ABNORMAL HIGH (ref 0.44–1.00)
GFR calc Af Amer: 27 mL/min — ABNORMAL LOW (ref >60)
GFR calc non Af Amer: 23 mL/min — ABNORMAL LOW (ref >60)
Glucose, Bld: 138 mg/dL — ABNORMAL HIGH (ref 70–99)
Phosphorus: 5.1 mg/dL — ABNORMAL HIGH (ref 2.5–4.6)
Potassium: 4.5 mmol/L (ref 3.5–5.1)
Sodium: 147 mmol/L — ABNORMAL HIGH (ref 135–145)

## 2018-12-02 LAB — CBC WITH DIFFERENTIAL/PLATELET
Abs Immature Granulocytes: 0.35 10*3/uL — ABNORMAL HIGH (ref 0.00–0.07)
Basophils Absolute: 0.1 10*3/uL (ref 0.0–0.1)
Basophils Relative: 1 %
Eosinophils Absolute: 1.7 10*3/uL — ABNORMAL HIGH (ref 0.0–0.5)
Eosinophils Relative: 11 %
HCT: 28.1 % — ABNORMAL LOW (ref 36.0–46.0)
Hemoglobin: 8.1 g/dL — ABNORMAL LOW (ref 12.0–15.0)
Immature Granulocytes: 2 %
Lymphocytes Relative: 5 %
Lymphs Abs: 0.8 10*3/uL (ref 0.7–4.0)
MCH: 29.5 pg (ref 26.0–34.0)
MCHC: 28.8 g/dL — ABNORMAL LOW (ref 30.0–36.0)
MCV: 102.2 fL — ABNORMAL HIGH (ref 80.0–100.0)
Monocytes Absolute: 0.9 10*3/uL (ref 0.1–1.0)
Monocytes Relative: 6 %
Neutro Abs: 12.2 10*3/uL — ABNORMAL HIGH (ref 1.7–7.7)
Neutrophils Relative %: 75 %
Platelets: 103 10*3/uL — ABNORMAL LOW (ref 150–400)
RBC: 2.75 MIL/uL — ABNORMAL LOW (ref 3.87–5.11)
RDW: 17 % — ABNORMAL HIGH (ref 11.5–15.5)
WBC: 16.1 10*3/uL — ABNORMAL HIGH (ref 4.0–10.5)
nRBC: 0.5 % — ABNORMAL HIGH (ref 0.0–0.2)

## 2018-12-02 LAB — GLUCOSE, CAPILLARY
Glucose-Capillary: 108 mg/dL — ABNORMAL HIGH (ref 70–99)
Glucose-Capillary: 122 mg/dL — ABNORMAL HIGH (ref 70–99)
Glucose-Capillary: 114 mg/dL — ABNORMAL HIGH (ref 70–99)
Glucose-Capillary: 171 mg/dL — ABNORMAL HIGH (ref 70–99)

## 2018-12-02 LAB — MRSA PCR SCREENING: MRSA by PCR: NEGATIVE

## 2018-12-02 LAB — MAGNESIUM: Magnesium: 2.4 mg/dL (ref 1.7–2.4)

## 2018-12-02 NOTE — Progress Notes (Signed)
Physical Therapy Treatment Patient Details Name: Audrey Collier MRN: [id number] DOB: 09/05/1960 Today's Date: 12/02/2018    History of Present Illness 58 yo female presented with fever, vomiting, weakness.  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  Echo showed vegetation.  Transferred to ICU 8/04 due to respiratory failure.  Underwent AVR with TEE intraoprative on 10/07/18. Extubated 10/08/18.  PEG placed 11/04/18.    PT Comments    Pt initially refusing therapy, but agreeable after encouragement from therapist and aunt present in room. Pt was able to progress EOB, however unable to stand with max A +2 and use of stedy. Pt nauseous throughout session. Pt assisted with pericare at end of session, as purwik was soiled from BM. She was able to roll with min A, however while in side lying pt's SpO2 dropped to 75%, requiring rest break with HOB elevated, and O2 increased to 3L. VC for pursed lipped breathing. Pt took ~3 min for SpO2 to return to therapeutic limits.     Follow Up Recommendations  SNF;LTACH     Equipment Recommendations  Other (comment)(TBD)    Recommendations for Other Services       Precautions / Restrictions Precautions Precautions: Fall;Sternal    Mobility  Bed Mobility Overal bed mobility: Needs Assistance Bed Mobility: Supine to Sit;Rolling;Sit to Supine Rolling: Min assist   Supine to sit: Mod assist Sit to supine: Total assist;+2 for physical assistance   General bed mobility comments: Pt able to roll for peri care with min A. however SpO2 dropped to 75% when holding the position, requiring rest break and O2 via [place] bumped up to 3L while pt recovered. Pt able to progress LE off EOB w/o assist. Mod A only for trunk elevation and to progress hips forward. Total A for return to supine  Transfers Overall transfer level: Needs assistance Equipment used: Rolling walker (2 wheeled) Transfers: Sit to/from Stand Sit to Stand: Max assist;+2 physical assistance          General transfer comment: Pt attempted to stand, first with RW and again with Stedy. Each time pt unable to clear buttocks fully off bed. 4 attempts before pt fatigued.  Ambulation/Gait                 Stairs             Wheelchair Mobility    Modified Rankin (Stroke Patients Only)       Balance Overall balance assessment: Needs assistance Sitting-balance support: No upper extremity supported;Feet supported Sitting balance-Leahy Scale: Fair Sitting balance - Comments: pt able to sit EOB supervision for safety Postural control: Posterior lean;Left lateral lean Standing balance support: Bilateral upper extremity supported Standing balance-Leahy Scale: Zero Standing balance comment: dependent on physical assist                            Cognition Arousal/Alertness: Awake/alert Behavior During Therapy: Flat affect Overall Cognitive Status: History of cognitive impairments - at baseline Area of Impairment: Following commands;Safety/judgement;Problem solving                   Current Attention Level: Sustained   Following Commands: Follows one step commands with increased time;Follows multi-step commands inconsistently Safety/Judgement: Decreased awareness of deficits Awareness: Intellectual Problem Solving: Slow processing;Decreased initiation;Difficulty sequencing;Requires verbal cues;Requires tactile cues General Comments: Very flat. Minimal verbal communications. Will nod and shake head to questions.      Exercises  General Comments        Pertinent Vitals/Pain Pain Assessment: Faces Faces Pain Scale: Hurts little more Pain Location: unable to state Pain Descriptors / Indicators: Grimacing;Discomfort Pain Intervention(s): Monitored during session;Limited activity within patient's tolerance    Home Living                      Prior Function            PT Goals (current goals can now be found in the care  plan section) Acute Rehab PT Goals Patient Stated Goal: not stated PT Goal Formulation: Patient unable to participate in goal setting Time For Goal Achievement: 12/08/18 Potential to Achieve Goals: Fair Progress towards PT goals: Not progressing toward goals - comment    Frequency    Min 2X/week      PT Plan Current plan remains appropriate    Co-evaluation              AM-PAC PT "6 Clicks" Mobility   Outcome Measure  Help needed turning from your back to your side while in a flat bed without using bedrails?: A Lot Help needed moving from lying on your back to sitting on the side of a flat bed without using bedrails?: A Lot Help needed moving to and from a bed to a chair (including a wheelchair)?: Total Help needed standing up from a chair using your arms (e.g., wheelchair or bedside chair)?: Total Help needed to walk in hospital room?: Total Help needed climbing 3-5 steps with a railing? : Total 6 Click Score: 8    End of Session Equipment Utilized During Treatment: Oxygen;Gait belt Activity Tolerance: Patient limited by fatigue(nausea) Patient left: with call bell/phone within reach;in bed;with bed alarm set Nurse Communication: Mobility status(Aunt requesting to speak with SW) PT Visit Diagnosis: Muscle weakness (generalized) (M62.81);Other abnormalities of gait and mobility (R26.89) Pain - Right/Left: (generalized) Pain - part of body: (abdomen)     Time: 7371-0626 PT Time Calculation (min) (ACUTE ONLY): 54 min  Charges:  $Therapeutic Activity: 53-67 mins                     Benjiman Core, [place] Pager [phone] Acute Rehab   Allena Katz 12/02/2018, 3:15 PM

## 2018-12-02 NOTE — Progress Notes (Signed)
[name]  PROGRESS NOTE    Audrey Collier  BDZ:[id number] DOB: November 16, 1960 DOA: 10/01/2018 PCP: Jani Gravel, MD   Brief Narrative:   Audrey Collier is a 58 year old female with past medical history significant ofcognitive delay, anxiety, hypothyroidism, and post menopausal bleeding (worked up 5 years ago), whopresented on 8/1with altered mental status from group homewith fever up to 103.8 F and emesis. Chest x-ray showed bilateral pneumonia and patient was noted to be COVID-19 negative x2. Treated initially with Rocephin and azithromycin for suspected community-acquired pneumonia. Blood cultures grew out [facility] patient was switched to Ancef with rifampin added per ID. Patient required intubation on 8/4,due to respiratory failure and care was taken over by PCCM. On 8/6 patient underwent TEE showing large aortic vegetation. CT surgery was consulted,and she was taken for aortic valve replacement on 10/07/2018. Postop patient noted to have renal insufficiency and antibiotics were adjusted. Foley catheters had to be placed due to urinary retention. She had continued to complain of intermittent abdominal pain and nausea. Overall patient's oral intake had been minimal and therefore core track was placed with dietary consultation for tube feeds. GI was consulted and performed EGD which did not reveal any acute cause for patient's symptoms on 9/3. General surgery was consulted due to mildly distended gallbladder concerning for cholecystitis, but HIDA scan was negative. Due to lack of oral intake she underwent percutaneous gastric tube placement by IR on 9/4.Nephrology consulted after creatinine elevated to 3.48. They were concerned for likelyAIN vs post strep glomerulonephritis. On 9/7 patient developed projectile vomiting and hemoglobin dropped down to 6.8 requiring transfusion of PRBCs. Required transfer back into the ICU for close monitoring and palliative care was consulted at that time where family  made patient DNR/DNI. Patient completed antibiotics for endocarditis on 9/17. Patient developed low-grade fever on 9/18 up to 100.7 F. Urinalysis concerning for infection, but urine culture inconclusive. Labs on 9/22 show alkaline phosphatase 535, albumin 1.8, AST 433, ALT 77, and total bilirubin 0.7. TRH called help with overall medical management.  9/30: N/V ON. CT ordered. 10/1: N/V improved. TFs held. Start back and slow rate today. 10/2: TF resumed. No further documentation of vomiting.    Assessment & Plan:   Principal Problem:   Staphylococcus aureus bacteremia with sepsis ([place]) Active Problems:   Severe sepsis ([place])   Bacteremia due to methicillin susceptible Staphylococcus aureus (MSSA)   Aortic valve endocarditis   Acute respiratory failure with hypoxia ([name])   S/P AVR (aortic valve replacement)   Pressure injury of skin   Palliative care by specialist   Adult failure to thrive   Weakness generalized   Severe hypoalbuminemia with anasarca - Patient still with poor appetite.  - Albumin levels around 1.8.  - Suspect patient likely third spacing due to poor nutritional status. - tube feed per dietary     - TF resumed; no new issues reported  Acute kidney injury Mild Hypernatremia - Patientwith a creatinine staying around 3 with decreased overall urine output.  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  Elevated  Liver enzymes - AST  mildly elevated on 9/20 to 61, alk phos was 334 with normal bilirubin.  - 9/22 rose to AST 433 and alk phos 535 with ALT 77. Downtrending since then. - Unclear cause at this time. Per GI c/s note from 9/23, suspected drug induced liver injury (concern for rifampin). - Crestor has been discontinued  - Last abdominal ultrasound from 9/1 noted mildly distended gallbladder with 15 mm gallstone and small amount of sludge. HIDA scan was negative for signs of acute cholecystitis at that time.   Hypothyroidism - Last TSH noted to be 19.826 on 10/30/2018. - Recheck TSH(8.35 appears to be trending down appropriately) - Continue levothyroxine  Anemia Throbocytopenia - Stable. - Hemoglobin 8.1 and platelets fluctuating.  - follow  Leukocytosis - mild, stable, continue to monitor - reactive to vomiting? - CT ab/pelvis sees opacities in LLL PNA; HAP? would need HAP coverage, consult pharm for dosing     - started treating HAP; white count improved; decreased N/V  Cognitive delay - Stable. Patient has been in a group home previous  HAP/LLL PNA     - vanc/cefepime     - END abx 10/5  Nausea Vomiting - this has been intermittent.  - Continue reglan. - repeat CT abdomen pelvis: without any specific GI abnormality - add PR phenergan     - improved today; slowly resume TF     - 10/2: TF resumed, no new issues reported  DVT prophylaxis: SCDs Code Status: DNR   Disposition Plan: TBD  Consultants:   TCTS  Antimicrobials:   Cefepime/Vanc   ROS:  Unable to obtain d/t mentation  Subjective: No acute events ON. No new vomiting.   Objective: Vitals:   12/01/18 2035 12/01/18 2300 12/01/18 2346 12/02/18 0500  BP: (!) 149/93  (!) 158/93   Pulse: 88  81   Resp: (!) 25 (!) 21 20   Temp: (!) 97.5 F (36.4 C)  98.9 F (37.2 C)   TempSrc: Oral  Oral   SpO2: 95%  97%   Weight:    84.7 kg  Height:         Intake/Output Summary (Last 24 hours) at 12/02/2018 1507 Last data filed at 12/02/2018 1212 Gross per 24 hour  Intake 129.98 ml  Output 350 ml  Net -220.02 ml   Filed Weights   11/30/18 0437 12/01/18 0435 12/02/18 0500  Weight: 88.4 kg 83.9 kg 84.7 kg    Examination:  General:58 y.o.femaleresting in bed in NAD Cardiovascular: RRR, +S1, S2, no m/g/r, equal pulses throughout Respiratory: CTABL, no w/r/r, normal WOB GI: BS+, NDNT, no masses noted, no organomegaly noted MSK: No e/c/c Skin: No rashes, bruises, ulcerations noted Neuro:somnolent.    Data Reviewed: I have personally reviewed following labs and imaging studies.  CBC: Recent Labs  Lab 11/28/18 0430 11/29/18 0431 11/30/18 0333 12/01/18 0448 12/02/18 0331  WBC 10.9* 13.2* 18.1* 13.4* 16.1*  NEUTROABS  --   --   --  10.5* 12.2*  HGB 8.2* 8.0* 8.4* 7.9* 8.1*  HCT 28.0* 28.8* 28.1* 26.4* 28.1*  MCV 101.4* 103.2* 101.1* 101.5* 102.2*  PLT 85* 84* 78* 91* 977*   Basic Metabolic Panel: Recent Labs  Lab 11/27/18 0501 11/28/18 0430 11/29/18 0431 11/30/18 0333 12/01/18 0448 12/02/18 0331  NA 144 144 147* 148* 150* 147*  K 4.7 4.8 5.1 5.3* 4.8 4.5  CL 104 105 106 106 110 106  CO2 31 32 32 29 32 31  GLUCOSE 124* 132* 148* 112*  95 138*  BUN 101* 96* 87* 83* 78* 81*  CREATININE 2.83* 2.70* 2.58* 2.46* 2.28* 2.28*  CALCIUM 8.7* 8.5* 8.8* 9.0 9.2 9.0  MG 2.5*  --  2.5* 2.4 2.6* 2.4  PHOS  --   --   --   --  5.4* 5.1*   GFR: Estimated Creatinine Clearance: 27.5 mL/min (A) (by C-G formula based on SCr of 2.28 mg/dL (H)). Liver Function Tests: Recent Labs  Lab 11/26/18 0523 11/27/18 0501 11/28/18 0430 11/30/18 0333 12/01/18 0448 12/02/18 0331  AST 51* 37 28 44*  --   --   ALT 46* 36 29 35  --   --   ALKPHOS 427* 352* 338* 323*  --   --   BILITOT 0.7 0.7 0.5 0.7  --   --   PROT 5.5* 5.2* 5.3* 5.6*  --   --   ALBUMIN 1.9* 1.9* 1.9* 2.1* 2.1* 2.1*   No results for input(s): LIPASE, AMYLASE in  the last 168 hours. No results for input(s): AMMONIA in the last 168 hours. Coagulation Profile: Recent Labs  Lab 11/26/18 0523  INR 1.2   Cardiac Enzymes: No results for input(s): CKTOTAL, CKMB, CKMBINDEX, TROPONINI in the last 168 hours. BNP (last 3 results) No results for input(s): PROBNP in the last 8760 hours. HbA1C: No results for input(s): HGBA1C in the last 72 hours. CBG: Recent Labs  Lab 12/01/18 0431 12/01/18 1154 12/01/18 2033 12/02/18 0407 12/02/18 1209  GLUCAP 95 84 108* 122* 114*   Lipid Profile: No results for input(s): CHOL, HDL, LDLCALC, TRIG, CHOLHDL, LDLDIRECT in the last 72 hours. Thyroid Function Tests: No results for input(s): TSH, T4TOTAL, FREET4, T3FREE, THYROIDAB in the last 72 hours. Anemia Panel: No results for input(s): VITAMINB12, FOLATE, FERRITIN, TIBC, IRON, RETICCTPCT in the last 72 hours. Sepsis Labs: No results for input(s): PROCALCITON, LATICACIDVEN in the last 168 hours.  No results found for this or any previous visit (from the past 240 hour(s)).    Radiology Studies: Ct Abdomen Pelvis Wo Contrast  Result Date: 11/30/2018 CLINICAL DATA:  Nausea, vomiting EXAM: CT ABDOMEN AND PELVIS WITHOUT CONTRAST TECHNIQUE: Multidetector CT imaging of the abdomen and pelvis was performed following the standard protocol without IV contrast. Sagittal and coronal MPR images reconstructed from axial data set. Dilute oral contrast administered COMPARISON:  11/07/2018 FINDINGS: Lower chest: Small to moderate RIGHT pleural effusion. Compressive atelectasis of RIGHT lower lobe. Additional airspace infiltrates throughout LEFT lower lobe and minimally in RIGHT lower lobe. Hepatobiliary: Gallbladder and liver unremarkable Pancreas: Partial fatty replacement. No definite focal abnormalities Spleen: Normal appearance Adrenals/Urinary Tract: Normal appearance of adrenal glands. Kidneys and ureters unremarkable. Mild bladder wall thickening though bladder is  underdistended, potentially artifactual. Stomach/Bowel: Gastrostomy tube. Appendix not visualized. Stomach and bowel loops otherwise normal appearance. Vascular/Lymphatic: Aorta normal caliber. Retroaortic LEFT renal vein. No adenopathy Reproductive: Atrophic uterus with unremarkable adnexa Other: Scattered ascites throughout abdomen and pelvis. No free air. Tiny umbilical hernia containing fat. Scattered subcutaneous edema. No focal abnormal fluid collection to suggest abscess. Musculoskeletal: Unremarkable IMPRESSION: Scattered ascites. Tiny umbilical hernia containing fat. RIGHT pleural effusion with basilar atelectasis and significant LEFT lower lobe consolidation/pneumonia. Electronically Signed   By: Lavonia Dana M.D.   On: 11/30/2018 15:27   Dg Chest Port 1 View  Result Date: 11/30/2018 CLINICAL DATA:  Pneumonia. EXAM: PORTABLE CHEST 1 VIEW COMPARISON:  11/22/2018 FINDINGS: Heart size remains enlarged. Left-sided PICC line terminates in upper right atrium. Signs of median sternotomy for aortic valve replacement.  Graded opacity in the right chest and diffuse interstitial and airspace opacities worse on the left, worse than on the study of 11/22/2018. IMPRESSION: Worsening right effusion and left greater than right airspace disease/multifocal pneumonia in the chest compared to 11/22/2018, lung bases better characterized on recent abdominal CT. Electronically Signed   By: Zetta Bills M.D.   On: 11/30/2018 16:56     Scheduled Meds:  amLODipine  10 mg Per Tube Daily   calcium-vitamin D  1 tablet Oral Q breakfast   Chlorhexidine Gluconate Cloth  6 each Topical Daily   feeding supplement (PRO-STAT SUGAR FREE 64)  30 mL Per Tube TID   FLUoxetine  10 mg Per Tube Daily   free water  200 mL Per Tube Q8H   levothyroxine  150 mcg Oral Q0600   mouth rinse  15 mL Mouth Rinse BID   metoCLOPramide (REGLAN) injection  10 mg Intravenous Q6H   metoprolol tartrate  25 mg Per Tube BID   montelukast   10 mg Oral QHS   multivitamin with minerals  1 tablet Oral Daily   pantoprazole sodium  40 mg Per Tube Daily   polyethylene glycol  17 g Per Tube Q M,W,F   sodium chloride flush  3 mL Intravenous Q12H   tamsulosin  0.4 mg Oral Daily   Continuous Infusions:  sodium chloride 500 mL (11/30/18 1741)   ceFEPime (MAXIPIME) IV 2 g (12/01/18 1630)   feeding supplement (OSMOLITE 1.5 CAL) 45 mL/hr at 12/01/18 2320   vancomycin       LOS: 62 days    Time spent: 25 minutes spent in the coordination of care today.    Jonnie Finner, DO [facility] Pager [phone]  If 7PM-7AM, please contact night-coverage [email or web address] Password [email or web address] 12/02/2018, 3:07 PM

## 2018-12-02 NOTE — TOC Progression Note (Signed)
Transition of Care [facility]) - Progression Note    Patient Details  Name: Audrey Collier MRN: [id number] Date of Birth: 1961/02/22  Transition of Care [facility]) CM/SW [place], [place] Phone Number: 12/02/2018, 4:29 PM  Clinical Narrative:     CSW visit with the patient at bedside along with her [place]  Provided update on SNF placement and answered all questions.   Patient will need COVID test prior to discharge- SNF updated on possible d/c on Monday., per MD.  [name], MSW, [facility] Clinical Social Worker [phone]    Expected Discharge Plan: [place] Barriers to Discharge: No SNF bed, Continued Medical Work up  Expected Discharge Plan and Services Expected Discharge Plan: [place] In-house Referral: Clinical Social Work Discharge Planning Services: NA Post Acute Care Choice: [place] Living arrangements for the past 2 months: Single Family Home                 DME Arranged: N/A DME Agency: NA                   Social Determinants of Health (SDOH) Interventions    Readmission Risk Interventions No flowsheet data found.

## 2018-12-02 NOTE — Progress Notes (Addendum)
      [address]       [place] [address]             [phone]      14 Days Post-Op Procedure(s) (LRB): ESOPHAGOGASTRODUODENOSCOPY (EGD) WITH PROPOFOL (N/A) BIOPSY  Subjective: Patient sleeping and awakened. She denies abdominal pain this am.  Objective: Vital signs in last 24 hours: Temp:  [97.5 F (36.4 C)-98.9 F (37.2 C)] 98.9 F (37.2 C) (10/01 2346) Pulse Rate:  [78-88] 81 (10/01 2346) Cardiac Rhythm: Normal sinus rhythm (10/02 0330) Resp:  [17-25] 20 (10/01 2346) BP: (144-161)/(87-94) 158/93 (10/01 2346) SpO2:  [92 %-100 %] 97 % (10/01 2346) Weight:  [84.7 kg] 84.7 kg (10/02 0500)  Intake/Output from previous day: 10/01 0701 - 10/02 0700 In: 130 [I.V.:114.7; NG/GT:15.3] Out: -    Heart: RRR Lungs: Clear bilaterally Abdomen: Soft, protuberant, hyperactive bowel sounds this am Extremities: Trace LE edema Wound: Sternal wound is well healed  Lab Results: Recent Labs    12/01/18 0448 12/02/18 0331  WBC 13.4* 16.1*  HGB 7.9* 8.1*  HCT 26.4* 28.1*  PLT 91* 103*   BMET:  Recent Labs    12/01/18 0448 12/02/18 0331  NA 150* 147*  K 4.8 4.5  CL 110 106  CO2 32 31  GLUCOSE 95 138*  BUN 78* 81*  CREATININE 2.28* 2.28*  CALCIUM 9.2 9.0    PT/INR:  No results for input(s): LABPROT, INR in the last 72 hours. ABG    Component Value Date/Time   PHART 7.430 10/08/2018 0920   HCO3 23.6 10/08/2018 0920   TCO2 25 10/08/2018 0920   ACIDBASEDEF 6.0 (H) 10/08/2018 0307   O2SAT 94.0 10/08/2018 0920   CBG (last 3)  Recent Labs    12/01/18 0431 12/01/18 1154 12/02/18 0407  GLUCAP 95 84 122*    Assessment/Plan: S/P Procedure(s) (LRB): ESOPHAGOGASTRODUODENOSCOPY (EGD) WITH PROPOFOL (N/A) BIOPSY  1. CV- She has been maintaining SR with HR in the 80's this am and is hypertensive. On Lopressor 25 mg bid and Amlodipine 10 mg daily. 2. Pulm- On 2 liters of oxygen via [place] this am.  3. AKI- creatinine remains 2.28. S/p kidney biopsy.  Per nephrology, no acute indication for dialysis; renal function appears to be stabilizing. 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 By her history it's from endocarditis and not much from the renal standpoint to do at this time. No immunomodulatory therapies. 4. GI- continues to have poor oral intake, on tube feeds at 45 ml/hr, Prostat.  Appreciate nutrition's assistance. She has severe hypoalbuminemia with anasarca. 5. Deconditioning PT/OT 6. Anemia-H and H this am stable at 8.1 and 28.1 7. Mild thrombocytopenia-platelets this am increased to 103,000 8. Hypothyroidism-continue Levothyroxine 150 mcg daily 9. WBC slightly increased to 16,100. No fever, no wound infection. May be from aspiration. On Vancomycin and Maxipime 10. CBGs 95/84/122   Nani Skillern PA-C [phone]   Agree with above Will follow peripherally Lajuana Matte

## 2018-12-02 NOTE — TOC Progression Note (Signed)
Transition of Care [facility]) - Progression Note    Patient Details  Name: SHADONNA BENEDICK MRN: [id number] Date of Birth: 05-24-1960  Transition of Care [facility]) CM/SW [place], [place] Phone Number: 12/02/2018, 11:50 AM  Clinical Narrative:     Susie Cassette has confirmed bed offer for patient. Patient will need COVID prior to discharge to SNF.   CSW will continue to follow and assist with discharge planning.  [name], MSW, [facility] Clinical Social Worker [phone]    Expected Discharge Plan: [place] Barriers to Discharge: No SNF bed, Continued Medical Work up  Expected Discharge Plan and Services Expected Discharge Plan: [place] In-house Referral: Clinical Social Work Discharge Planning Services: NA Post Acute Care Choice: [place] Living arrangements for the past 2 months: Single Family Home                 DME Arranged: N/A DME Agency: NA                   Social Determinants of Health (SDOH) Interventions    Readmission Risk Interventions No flowsheet data found.

## 2018-12-03 LAB — GLUCOSE, CAPILLARY
Glucose-Capillary: 147 mg/dL — ABNORMAL HIGH (ref 70–99)
Glucose-Capillary: 116 mg/dL — ABNORMAL HIGH (ref 70–99)
Glucose-Capillary: 126 mg/dL — ABNORMAL HIGH (ref 70–99)

## 2018-12-03 NOTE — Progress Notes (Signed)
[name]  PROGRESS NOTE    Audrey Collier  TFT:[id number] DOB: Jul 09, 1960 DOA: 10/01/2018 PCP: Jani Gravel, MD   Brief Narrative:   Audrey Collier is a 58 year old female with past medical history significant ofcognitive delay, anxiety, hypothyroidism, and post menopausal bleeding (worked up 5 years ago), whopresented on 8/1with altered mental status from group homewith fever up to 103.8 F and emesis. Chest x-ray showed bilateral pneumonia and patient was noted to be COVID-19 negative x2. Treated initially with Rocephin and azithromycin for suspected community-acquired pneumonia. Blood cultures grew out [facility] patient was switched to Ancef with rifampin added per ID. Patient required intubation on 8/4,due to respiratory failure and care was taken over by PCCM. On 8/6 patient underwent TEE showing large aortic vegetation. CT surgery was consulted,and she was taken for aortic valve replacement on 10/07/2018. Postop patient noted to have renal insufficiency and antibiotics were adjusted. Foley catheters had to be placed due to urinary retention. She had continued to complain of intermittent abdominal pain and nausea. Overall patient's oral intake had been minimal and therefore core track was placed with dietary consultation for tube feeds. GI was consulted and performed EGD which did not reveal any acute cause for patient's symptoms on 9/3. General surgery was consulted due to mildly distended gallbladder concerning for cholecystitis, but HIDA scan was negative. Due to lack of oral intake she underwent percutaneous gastric tube placement by IR on 9/4.Nephrology consulted after creatinine elevated to 3.48. They were concerned for likelyAIN vs post strep glomerulonephritis. On 9/7 patient developed projectile vomiting and hemoglobin dropped down to 6.8 requiring transfusion of PRBCs. Required transfer back into the ICU for close monitoring and palliative care was consulted at that time where family  made patient DNR/DNI. Patient completed antibiotics for endocarditis on 9/17. Patient developed low-grade fever on 9/18 up to 100.7 F. Urinalysis concerning for infection, but urine culture inconclusive. Labs on 9/22 show alkaline phosphatase 535, albumin 1.8, AST 433, ALT 77, and total bilirubin 0.7. TRH called help with overall medical management.  9/30: N/V ON. CT ordered. 10/1: N/V improved. TFs held. Start back and slow rate today. 10/2: TF resumed. No further documentation of vomiting.  10/3: No acute events ON. Needs COVID test today.   Assessment & Plan:   Principal Problem:   Staphylococcus aureus bacteremia with sepsis ([place]) Active Problems:   Severe sepsis ([place])   Bacteremia due to methicillin susceptible Staphylococcus aureus (MSSA)   Aortic valve endocarditis   Acute respiratory failure with hypoxia ([name])   S/P AVR (aortic valve replacement)   Pressure injury of skin   Palliative care by specialist   Adult failure to thrive   Weakness generalized  Severe hypoalbuminemia with anasarca - Patient still with poor appetite.  - Albumin levels around 1.8.  - Suspect patient likely third spacing due to poor nutritional status. - tube feed per dietary     - TF resumed; no new issues reported     - continue TF; don't know that we will get much better  Acute kidney injury Mild Hypernatremia - Patientwith a creatinine staying around 3 with decreased overall urine output.  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  Elevated Liver enzymes - AST mildly elevated on 9/20 to 61, alk phos was 334 with normal bilirubin.  - 9/22 rose to AST 433 and alk phos 535 with ALT 77. Downtrending since then. - Unclear cause at this time. Per GI c/s note from 9/23, suspected drug induced liver injury (concern for rifampin). - Crestor has been discontinued  - Last abdominal ultrasound from 9/1 noted mildly distended gallbladder with 15 mm gallstone and small amount of sludge. HIDA scan was negative for signs of acute cholecystitis at that time.   Hypothyroidism - Last TSH noted to be 19.826 on 10/30/2018. - Recheck TSH(8.35 appears to be trending down appropriately) - Continue levothyroxine  Anemia Throbocytopenia - Stable; monitor  Leukocytosis - mild, stable, continue to monitor - reactive to vomiting? - CT ab/pelvis sees opacities in LLL PNA; HAP? would need HAP coverage, consult pharm for dosing - started treating HAP; white count improved; decreased N/V     - persistent; reactive?  Cognitive delay - Stable.     - unable to take care of herself; will need NH/SNF at d/c  HAP/LLL PNA     - vanc/cefepime     - END abx 10/5  Nausea Vomiting - this has been intermittent.  - Continue reglan. - repeat CT abdomen pelvis: without any specific GI abnormality - add PR phenergan - improved today; slowly resume TF     - 10/2: TF resumed, no new issues reported  DVT prophylaxis:SCDs Code Status:DNR Disposition Plan:TBD  Consultants:   TCTS  Antimicrobials:   Cefepime/Vanc   ROS:  Reports HA. Remainder 10-pt ROS is negative for all not previously mentioned.  Subjective: "Headache."  Objective: Vitals:   12/03/18 1000 12/03/18 1100 12/03/18 1200 12/03/18 1300  BP: (!) 141/74 (!) 146/84 (!) 157/84 (!) 145/84  Pulse: 92 93 99 84    Resp: (!) 24 (!) 25 13 (!) 29  Temp:  99.2 F (37.3 C)    TempSrc:  Oral    SpO2: 95% 95% 95% 92%  Weight:      Height:        Intake/Output Summary (Last 24 hours) at 12/03/2018 1442 Last data filed at 12/03/2018 1300 Gross per 24 hour  Intake 2035.67 ml  Output 1000 ml  Net 1035.67 ml   Filed Weights   12/01/18 0435 12/02/18 0500 12/03/18 0529  Weight: 83.9 kg 84.7 kg 86.5 kg    Examination:  General:58 y.o.femaleresting in bed in NAD Cardiovascular: RRR, +S1, S2, no m/g/r, equal pulses throughout Respiratory: clear, but shallow GI: BS+, NDNT, no masses noted, no organomegaly noted MSK: No c/c; BLE edema Skin: No rashes, bruises, ulcerations noted Neuro: Alert and following commands   Data Reviewed: I have personally reviewed following labs and imaging studies.  CBC: Recent Labs  Lab 11/28/18 0430 11/29/18 0431 11/30/18 0333 12/01/18 0448 12/02/18 0331  WBC 10.9* 13.2* 18.1* 13.4* 16.1*  NEUTROABS  --   --   --  10.5* 12.2*  HGB 8.2* 8.0* 8.4* 7.9* 8.1*  HCT 28.0* 28.8* 28.1* 26.4* 28.1*  MCV 101.4* 103.2* 101.1* 101.5* 102.2*  PLT 85* 84* 78* 91* 226*   Basic Metabolic Panel: Recent Labs  Lab 11/27/18 0501 11/28/18 0430 11/29/18 0431 11/30/18 0333 12/01/18 0448 12/02/18 0331  NA 144 144 147* 148* 150* 147*  K 4.7 4.8 5.1 5.3* 4.8 4.5  CL 104 105 106 106  110 106  CO2 31 32 32 29 32 31  GLUCOSE 124* 132* 148* 112* 95 138*  BUN 101* 96* 87* 83* 78* 81*  CREATININE 2.83* 2.70* 2.58* 2.46* 2.28* 2.28*  CALCIUM 8.7* 8.5* 8.8* 9.0 9.2 9.0  MG 2.5*  --  2.5* 2.4 2.6* 2.4  PHOS  --   --   --   --  5.4* 5.1*   GFR: Estimated Creatinine Clearance: 27.8 mL/min (A) (by C-G formula based on SCr of 2.28 mg/dL (H)). Liver Function Tests: Recent Labs  Lab 11/27/18 0501 11/28/18 0430 11/30/18 0333 12/01/18 0448 12/02/18 0331  AST 37 28 44*  --   --   ALT 36 29 35  --   --   ALKPHOS 352* 338* 323*  --   --   BILITOT 0.7 0.5 0.7  --   --   PROT  5.2* 5.3* 5.6*  --   --   ALBUMIN 1.9* 1.9* 2.1* 2.1* 2.1*   No results for input(s): LIPASE, AMYLASE in the last 168 hours. No results for input(s): AMMONIA in the last 168 hours. Coagulation Profile: No results for input(s): INR, PROTIME in the last 168 hours. Cardiac Enzymes: No results for input(s): CKTOTAL, CKMB, CKMBINDEX, TROPONINI in the last 168 hours. BNP (last 3 results) No results for input(s): PROBNP in the last 8760 hours. HbA1C: No results for input(s): HGBA1C in the last 72 hours. CBG: Recent Labs  Lab 12/02/18 0407 12/02/18 1209 12/02/18 1955 12/03/18 0427 12/03/18 1140  GLUCAP 122* 114* 171* 147* 116*   Lipid Profile: No results for input(s): CHOL, HDL, LDLCALC, TRIG, CHOLHDL, LDLDIRECT in the last 72 hours. Thyroid Function Tests: No results for input(s): TSH, T4TOTAL, FREET4, T3FREE, THYROIDAB in the last 72 hours. Anemia Panel: No results for input(s): VITAMINB12, FOLATE, FERRITIN, TIBC, IRON, RETICCTPCT in the last 72 hours. Sepsis Labs: No results for input(s): PROCALCITON, LATICACIDVEN in the last 168 hours.  Recent Results (from the past 240 hour(s))  MRSA PCR Screening     Status: None   Collection Time: 12/02/18  6:33 PM   Specimen: Nasopharyngeal  Result Value Ref Range Status   MRSA by PCR NEGATIVE NEGATIVE Final    Comment:        The GeneXpert MRSA Assay (FDA approved for NASAL specimens only), is one component of a comprehensive MRSA colonization surveillance program. It is not intended to diagnose MRSA infection nor to guide or monitor treatment for MRSA infections. Performed at [facility], [place] [address] [place] [address]       Radiology Studies: No results found.   Scheduled Meds:  amLODipine  10 mg Per Tube Daily   calcium-vitamin D  1 tablet Oral Q breakfast   Chlorhexidine Gluconate Cloth  6 each Topical Daily   feeding supplement (PRO-STAT SUGAR FREE 64)  30 mL Per Tube TID   FLUoxetine  10 mg  Per Tube Daily   free water  200 mL Per Tube Q8H   levothyroxine  150 mcg Oral Q0600   mouth rinse  15 mL Mouth Rinse BID   metoCLOPramide (REGLAN) injection  10 mg Intravenous Q6H   metoprolol tartrate  25 mg Per Tube BID   montelukast  10 mg Oral QHS   multivitamin with minerals  1 tablet Oral Daily   pantoprazole sodium  40 mg Per Tube Daily   polyethylene glycol  17 g Per Tube Q M,W,F   sodium chloride flush  3 mL Intravenous Q12H  tamsulosin  0.4 mg Oral Daily   Continuous Infusions:  sodium chloride 500 mL (11/30/18 1741)   ceFEPime (MAXIPIME) IV 2 g (12/02/18 1643)   feeding supplement (OSMOLITE 1.5 CAL) 45 mL/hr at 12/03/18 0500   vancomycin 1,250 mg (12/02/18 1741)     LOS: 63 days    Time spent: 25 minutes spent in the coordination of care today.   Jonnie Finner, DO [facility] Pager [phone]  If 7PM-7AM, please contact night-coverage [email or web address] Password [email or web address] 12/03/2018, 2:42 PM

## 2018-12-03 NOTE — Progress Notes (Signed)
Pharmacy Antibiotic Note  Audrey Collier is a 58 y.o. female admitted on 10/01/2018 now with HCAP  Pharmacy has been consulted for cefepime and vancomycin dosing. Patient with recent treatment for MSSA endocarditis and AKI.   WBC is 16.1, patient is afebrile, and Scr is stable at 2.28  Plan: Cefepime 2gm IV q24h Continue vancomycin 1250mg  IV q48h Expected AUC 526.2(goal 400-550), Scr used 2.28 Monitor renal function, LOT, Deescalation MRSA PCR negative - suggest D/C vanc   Height: 5\' 2"  (157.5 cm) Weight: 190 lb 11.2 oz (86.5 kg) IBW/kg (Calculated) : 50.1  Temp (24hrs), Avg:99 F (37.2 C), Min:98.7 F (37.1 C), Max:99.2 F (37.3 C)  Recent Labs  Lab 11/28/18 0430 11/29/18 0431 11/30/18 0333 12/01/18 0448 12/02/18 0331  WBC 10.9* 13.2* 18.1* 13.4* 16.1*  CREATININE 2.70* 2.58* 2.46* 2.28* 2.28*    Estimated Creatinine Clearance: 27.8 mL/min (A) (by C-G formula based on SCr of 2.28 mg/dL (H)).    No Known Allergies  Sherren Kerns, PharmD PGY1 Acute Care Pharmacy Resident Please utilize Amion for appropriate phone number to reach the unit pharmacist ([place])    12/03/2018 3:25 PM

## 2018-12-04 LAB — RENAL FUNCTION PANEL
Albumin: 2 g/dL — ABNORMAL LOW (ref 3.5–5.0)
Anion gap: 10 (ref 5–15)
BUN: 64 mg/dL — ABNORMAL HIGH (ref 6–20)
CO2: 29 mmol/L (ref 22–32)
Calcium: 8.7 mg/dL — ABNORMAL LOW (ref 8.9–10.3)
Chloride: 107 mmol/L (ref 98–111)
Creatinine, Ser: 1.82 mg/dL — ABNORMAL HIGH (ref 0.44–1.00)
GFR calc Af Amer: 35 mL/min — ABNORMAL LOW (ref >60)
GFR calc non Af Amer: 30 mL/min — ABNORMAL LOW (ref >60)
Glucose, Bld: 107 mg/dL — ABNORMAL HIGH (ref 70–99)
Phosphorus: 3.6 mg/dL (ref 2.5–4.6)
Potassium: 4.2 mmol/L (ref 3.5–5.1)
Sodium: 146 mmol/L — ABNORMAL HIGH (ref 135–145)

## 2018-12-04 LAB — GLUCOSE, CAPILLARY
Glucose-Capillary: 145 mg/dL — ABNORMAL HIGH (ref 70–99)
Glucose-Capillary: 124 mg/dL — ABNORMAL HIGH (ref 70–99)

## 2018-12-04 LAB — CBC WITH DIFFERENTIAL/PLATELET
Abs Immature Granulocytes: 0.2 10*3/uL — ABNORMAL HIGH (ref 0.00–0.07)
Basophils Absolute: 0.1 10*3/uL (ref 0.0–0.1)
Basophils Relative: 0 %
Eosinophils Absolute: 1.3 10*3/uL — ABNORMAL HIGH (ref 0.0–0.5)
Eosinophils Relative: 7 %
HCT: 27.6 % — ABNORMAL LOW (ref 36.0–46.0)
Hemoglobin: 8.1 g/dL — ABNORMAL LOW (ref 12.0–15.0)
Immature Granulocytes: 1 %
Lymphocytes Relative: 5 %
Lymphs Abs: 1 10*3/uL (ref 0.7–4.0)
MCH: 29.6 pg (ref 26.0–34.0)
MCHC: 29.3 g/dL — ABNORMAL LOW (ref 30.0–36.0)
MCV: 100.7 fL — ABNORMAL HIGH (ref 80.0–100.0)
Monocytes Absolute: 1.1 10*3/uL — ABNORMAL HIGH (ref 0.1–1.0)
Monocytes Relative: 6 %
Neutro Abs: 15.1 10*3/uL — ABNORMAL HIGH (ref 1.7–7.7)
Neutrophils Relative %: 81 %
Platelets: 86 10*3/uL — ABNORMAL LOW (ref 150–400)
RBC: 2.74 MIL/uL — ABNORMAL LOW (ref 3.87–5.11)
RDW: 17 % — ABNORMAL HIGH (ref 11.5–15.5)
WBC: 18.6 10*3/uL — ABNORMAL HIGH (ref 4.0–10.5)
nRBC: 0.1 % (ref 0.0–0.2)

## 2018-12-04 LAB — MAGNESIUM: Magnesium: 2.1 mg/dL (ref 1.7–2.4)

## 2018-12-04 NOTE — Progress Notes (Signed)
Audrey Collier  PROGRESS NOTE    Audrey Collier  OJJ:[id number] DOB: 01/19/1961 DOA: 10/01/2018 PCP: Jani Gravel, MD   Brief Narrative:   Audrey Collier is a 58 year old female with past medical history significant ofcognitive delay, anxiety, hypothyroidism, and post menopausal bleeding (worked up 5 years ago), whopresented on 8/1with altered mental status from group homewith fever up to 103.8 F and emesis. Chest x-ray showed bilateral pneumonia and patient was noted to be COVID-19 negative x2. Treated initially with Rocephin and azithromycin for suspected community-acquired pneumonia. Blood cultures grew out [facility] patient was switched to Ancef with rifampin added per ID. Patient required intubation on 8/4,due to respiratory failure and care was taken over by PCCM. On 8/6 patient underwent TEE showing large aortic vegetation. CT surgery was consulted,and she was taken for aortic valve replacement on 10/07/2018. Postop patient noted to have renal insufficiency and antibiotics were adjusted. Foley catheters had to be placed due to urinary retention. She had continued to complain of intermittent abdominal pain and nausea. Overall patient's oral intake had been minimal and therefore core track was placed with dietary consultation for tube feeds. GI was consulted and performed EGD which did not reveal any acute cause for patient's symptoms on 9/3. General surgery was consulted due to mildly distended gallbladder concerning for cholecystitis, but HIDA scan was negative. Due to lack of oral intake she underwent percutaneous gastric tube placement by IR on 9/4.Nephrology consulted after creatinine elevated to 3.48. They were concerned for likelyAIN vs post strep glomerulonephritis. On 9/7 patient developed projectile vomiting and hemoglobin dropped down to 6.8 requiring transfusion of PRBCs. Required transfer back into the ICU for close monitoring and palliative care was consulted at that time where family  made patient DNR/DNI. Patient completed antibiotics for endocarditis on 9/17. Patient developed low-grade fever on 9/18 up to 100.7 F. Urinalysis concerning for infection, but urine culture inconclusive. Labs on 9/22 show alkaline phosphatase 535, albumin 1.8, AST 433, ALT 77, and total bilirubin 0.7. TRH called help with overall medical management.  9/30: N/V ON. CT ordered. 10/1: N/V improved. TFs held. Start back and slow rate today. 10/2: TF resumed. No further documentation of vomiting. 10/3: No acute events ON. Needs COVID test today. 10/4: looking good this morning. COVID pending; to SNF tomorrow?   Assessment & Plan:   Principal Problem:   Staphylococcus aureus bacteremia with sepsis ([place]) Active Problems:   Severe sepsis ([place])   Bacteremia due to methicillin susceptible Staphylococcus aureus (MSSA)   Aortic valve endocarditis   Acute respiratory failure with hypoxia ([name])   S/P AVR (aortic valve replacement)   Pressure injury of skin   Palliative care by specialist   Adult failure to thrive   Weakness generalized   Severe hypoalbuminemia with anasarca - Patient still with poor appetite.  - Albumin levels around 1.8.  - Suspect patient likely third spacing due to poor nutritional status. - tube feed per dietary - TF resumed; no new issues reported     - continue TF; don't know that we will get much better  Acute kidney injury Mild Hypernatremia - Patientwith a creatinine staying around 3 with decreased overall urine output.  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  Elevated Liver enzymes - AST mildly elevated on 9/20 to 61, alk phos was 334 with normal bilirubin.  - 9/22 rose to AST 433 and alk phos 535 with ALT 77. Downtrending since then. - Unclear cause at this time. Per GI c/s note from 9/23, suspected drug induced liver injury (concern for rifampin). - Crestor has been discontinued  - Last abdominal ultrasound from 9/1 noted mildly distended gallbladder with 15 mm gallstone and small amount of sludge. HIDA scan was negative for signs of acute cholecystitis at that time.   Hypothyroidism - Last TSH noted to be 19.826 on 10/30/2018. - Recheck TSH(8.35 appears to be trending down appropriately) - Continue levothyroxine  Anemia Throbocytopenia - Stable; monitor  Leukocytosis - mild, stable, continue to monitor - reactive to vomiting? - CT ab/pelvis sees opacities in LLL PNA; HAP? would need HAP coverage, consult pharm for dosing - started treating HAP; white count improved; decreased N/V     - persistent; reactive?     - just about completed with abx for PNA; I do not see what this leukocytosis is correlating to; monitor clinically  Cognitive delay - Stable.     - unable to take care of herself; will need NH/SNF at d/c  HAP/LLL PNA - vanc/cefepime     - vanc d/c'd - END abx 10/5  Nausea Vomiting - this has been intermittent.  - Continue reglan. - repeat CT abdomen pelvis: without any specific GI abnormality - tolerating TF; is taking PO supplementation as well; continue  DVT prophylaxis:SCDs Code Status:DNR Disposition Plan:TBD  Consultants:  TCTS  Antimicrobials:   Cefepime   Subjective: "Can I get water?"  Objective: Vitals:   12/03/18 2300 12/04/18 0452 12/04/18  0928 12/04/18 1036  BP: (!) 159/84 (!) 146/88  (!) 152/86  Pulse: 93 91    Resp: (!) 22 (!) 22    Temp: 98.8 F (37.1 C) 98.6 F (37 C) (!) 97.5 F (36.4 C)   TempSrc: Oral Oral Axillary   SpO2: 98% 97%    Weight:  87.7 kg    Height:        Intake/Output Summary (Last 24 hours) at 12/04/2018 1328 Last data filed at 12/04/2018 0930 Gross per 24 hour  Intake 1090 ml  Output 950 ml  Net 140 ml   Filed Weights   12/02/18 0500 12/03/18 0529 12/04/18 0452  Weight: 84.7 kg 86.5 kg 87.7 kg    Examination:  General:58 y.o.femaleresting in bed in NAD Cardiovascular: RRR, +S1, S2, no m/g/r, equal pulses throughout Respiratory: clear, but shallow GI: BS+, NDNT, no masses noted, no organomegaly noted MSK: No c/c; BLE edema Skin: No rashes, bruises, ulcerations noted Neuro: Alert to name, follows commands   Data Reviewed: I have personally reviewed following labs and imaging studies.  CBC: Recent Labs  Lab 11/29/18 0431 11/30/18 0333 12/01/18 0448 12/02/18 0331 12/04/18 0500  WBC 13.2* 18.1* 13.4* 16.1* 18.6*  NEUTROABS  --   --  10.5* 12.2* 15.1*  HGB 8.0* 8.4* 7.9* 8.1* 8.1*  HCT 28.8* 28.1* 26.4* 28.1* 27.6*  MCV 103.2* 101.1* 101.5* 102.2* 100.7*  PLT 84* 78* 91* 103* 86*   Basic Metabolic Panel: Recent Labs  Lab 11/29/18 0431 11/30/18 0333 12/01/18 0448 12/02/18 0331 12/04/18 0500  NA 147* 148* 150* 147* 146*  K 5.1 5.3* 4.8 4.5 4.2  CL 106 106 110 106 107  CO2 32 29 32 31 29  GLUCOSE 148* 112* 95 138* 107*  BUN 87* 83* 78* 81* 64*  CREATININE 2.58* 2.46* 2.28* 2.28* 1.82*  CALCIUM 8.8* 9.0 9.2 9.0 8.7*  MG 2.5* 2.4 2.6* 2.4 2.1  PHOS  --   --  5.4* 5.1* 3.6   GFR: Estimated Creatinine Clearance: 35 mL/min (A) (by C-G formula based on SCr of 1.82 mg/dL (H)). Liver Function Tests: Recent Labs  Lab 11/28/18 0430 11/30/18 0333 12/01/18 0448 12/02/18 0331 12/04/18 0500  AST 28 44*  --   --   --   ALT 29 35  --   --   --   ALKPHOS 338* 323*  --    --   --   BILITOT 0.5 0.7  --   --   --   PROT 5.3* 5.6*  --   --   --   ALBUMIN 1.9* 2.1* 2.1* 2.1* 2.0*   No results for input(s): LIPASE, AMYLASE in the last 168 hours. No results for input(s): AMMONIA in the last 168 hours. Coagulation Profile: No results for input(s): INR, PROTIME in the last 168 hours. Cardiac Enzymes: No results for input(s): CKTOTAL, CKMB, CKMBINDEX, TROPONINI in the last 168 hours. BNP (last 3 results) No results for input(s): PROBNP in the last 8760 hours. HbA1C: No results for input(s): HGBA1C in the last 72 hours. CBG: Recent Labs  Lab 12/02/18 1955 12/03/18 0427 12/03/18 1140 12/03/18 1953 12/04/18 1136  GLUCAP 171* 147* 116* 126* 145*   Lipid Profile: No results for input(s): CHOL, HDL, LDLCALC, TRIG, CHOLHDL, LDLDIRECT in the last 72 hours. Thyroid Function Tests: No results for input(s): TSH, T4TOTAL, FREET4, T3FREE, THYROIDAB in the last 72 hours. Anemia Panel: No results for input(s): VITAMINB12, FOLATE, FERRITIN, TIBC, IRON, RETICCTPCT in the last 72 hours. Sepsis Labs: No results for input(s): PROCALCITON, LATICACIDVEN in the last 168 hours.  Recent Results (from the past 240 hour(s))  MRSA PCR Screening     Status: None   Collection Time: 12/02/18  6:33 PM   Specimen: Nasopharyngeal  Result Value Ref Range Status   MRSA by PCR NEGATIVE NEGATIVE Final    Comment:        The GeneXpert MRSA Assay (FDA approved for NASAL specimens only), is one component of a comprehensive MRSA colonization surveillance program. It is not intended to diagnose MRSA infection nor to guide or monitor treatment for MRSA infections. Performed at [facility], [place] [address] [place], [place] [address]       Radiology Studies: No results found.   Scheduled Meds:  amLODipine  10 mg Per Tube Daily   calcium-vitamin D  1 tablet Oral Q breakfast   Chlorhexidine Gluconate Cloth  6 each Topical Daily   feeding supplement (PRO-STAT SUGAR  FREE 64)  30 mL Per Tube TID   FLUoxetine  10 mg Per Tube Daily   free water  200 mL Per Tube Q8H   levothyroxine  150 mcg Oral Q0600   mouth rinse  15 mL Mouth Rinse BID   metoCLOPramide (REGLAN) injection  10 mg Intravenous Q6H   metoprolol tartrate  25 mg Per Tube BID   montelukast  10 mg Oral QHS   multivitamin with minerals  1 tablet Oral Daily   pantoprazole sodium  40 mg Per Tube Daily   polyethylene glycol  17 g Per Tube Q M,W,F   sodium chloride flush  3 mL Intravenous  Q12H   tamsulosin  0.4 mg Oral Daily   Continuous Infusions:  sodium chloride 500 mL (11/30/18 1741)   ceFEPime (MAXIPIME) IV 2 g (12/03/18 1721)   feeding supplement (OSMOLITE 1.5 CAL) 1,000 mL (12/04/18 0505)     LOS: 64 days    Time spent: 25 minutes spent in the coordination of care today.    Jonnie Finner, DO [facility] Pager [phone]  If 7PM-7AM, please contact night-coverage [email or web address] Password [email or web address] 12/04/2018, 1:28 PM

## 2018-12-05 LAB — GLUCOSE, CAPILLARY
Glucose-Capillary: 125 mg/dL — ABNORMAL HIGH (ref 70–99)
Glucose-Capillary: 117 mg/dL — ABNORMAL HIGH (ref 70–99)
Glucose-Capillary: 121 mg/dL — ABNORMAL HIGH (ref 70–99)
Glucose-Capillary: 140 mg/dL — ABNORMAL HIGH (ref 70–99)

## 2018-12-05 LAB — RENAL FUNCTION PANEL
Albumin: 2 g/dL — ABNORMAL LOW (ref 3.5–5.0)
Anion gap: 9 (ref 5–15)
BUN: 62 mg/dL — ABNORMAL HIGH (ref 6–20)
CO2: 30 mmol/L (ref 22–32)
Calcium: 8.7 mg/dL — ABNORMAL LOW (ref 8.9–10.3)
Chloride: 105 mmol/L (ref 98–111)
Creatinine, Ser: 1.78 mg/dL — ABNORMAL HIGH (ref 0.44–1.00)
GFR calc Af Amer: 36 mL/min — ABNORMAL LOW (ref >60)
GFR calc non Af Amer: 31 mL/min — ABNORMAL LOW (ref >60)
Glucose, Bld: 111 mg/dL — ABNORMAL HIGH (ref 70–99)
Phosphorus: 3.9 mg/dL (ref 2.5–4.6)
Potassium: 4.1 mmol/L (ref 3.5–5.1)
Sodium: 144 mmol/L (ref 135–145)

## 2018-12-05 LAB — CBC WITH DIFFERENTIAL/PLATELET
Abs Immature Granulocytes: 0.19 10*3/uL — ABNORMAL HIGH (ref 0.00–0.07)
Basophils Absolute: 0.1 10*3/uL (ref 0.0–0.1)
Basophils Relative: 1 %
Eosinophils Absolute: 1.3 10*3/uL — ABNORMAL HIGH (ref 0.0–0.5)
Eosinophils Relative: 8 %
HCT: 25.1 % — ABNORMAL LOW (ref 36.0–46.0)
Hemoglobin: 7.6 g/dL — ABNORMAL LOW (ref 12.0–15.0)
Immature Granulocytes: 1 %
Lymphocytes Relative: 5 %
Lymphs Abs: 0.8 10*3/uL (ref 0.7–4.0)
MCH: 30.3 pg (ref 26.0–34.0)
MCHC: 30.3 g/dL (ref 30.0–36.0)
MCV: 100 fL (ref 80.0–100.0)
Monocytes Absolute: 1 10*3/uL (ref 0.1–1.0)
Monocytes Relative: 6 %
Neutro Abs: 13.1 10*3/uL — ABNORMAL HIGH (ref 1.7–7.7)
Neutrophils Relative %: 79 %
Platelets: 96 10*3/uL — ABNORMAL LOW (ref 150–400)
RBC: 2.51 MIL/uL — ABNORMAL LOW (ref 3.87–5.11)
RDW: 17 % — ABNORMAL HIGH (ref 11.5–15.5)
WBC: 16.5 10*3/uL — ABNORMAL HIGH (ref 4.0–10.5)
nRBC: 0 % (ref 0.0–0.2)

## 2018-12-05 LAB — HEPATIC FUNCTION PANEL
ALT: 28 U/L (ref 0–44)
AST: 29 U/L (ref 15–41)
Albumin: 2 g/dL — ABNORMAL LOW (ref 3.5–5.0)
Alkaline Phosphatase: 236 U/L — ABNORMAL HIGH (ref 38–126)
Bilirubin, Direct: 0.2 mg/dL (ref 0.0–0.2)
Indirect Bilirubin: 0.3 mg/dL (ref 0.3–0.9)
Total Bilirubin: 0.5 mg/dL (ref 0.3–1.2)
Total Protein: 5.8 g/dL — ABNORMAL LOW (ref 6.5–8.1)

## 2018-12-05 LAB — NOVEL CORONAVIRUS, NAA (HOSP ORDER, SEND-OUT TO REF LAB; TAT 18-24 HRS): SARS-CoV-2, NAA: NOT DETECTED

## 2018-12-05 LAB — LIPASE, BLOOD: Lipase: 27 U/L (ref 11–51)

## 2018-12-05 LAB — MAGNESIUM: Magnesium: 2.1 mg/dL (ref 1.7–2.4)

## 2018-12-05 NOTE — Progress Notes (Signed)
[name]  PROGRESS NOTE    REILYNN LAURO  CVU:[id number] DOB: 1961-02-07 DOA: 10/01/2018 PCP: Jani Gravel, MD   Brief Narrative:   Audrey Collier is a 58 year old female with past medical history significant ofcognitive delay, anxiety, hypothyroidism, and post menopausal bleeding (worked up 5 years ago), whopresented on 8/1with altered mental status from group homewith fever up to 103.8 F and emesis. Chest x-ray showed bilateral pneumonia and patient was noted to be COVID-19 negative x2. Treated initially with Rocephin and azithromycin for suspected community-acquired pneumonia. Blood cultures grew out [facility] patient was switched to Ancef with rifampin added per ID. Patient required intubation on 8/4,due to respiratory failure and care was taken over by PCCM. On 8/6 patient underwent TEE showing large aortic vegetation. CT surgery was consulted,and she was taken for aortic valve replacement on 10/07/2018. Postop patient noted to have renal insufficiency and antibiotics were adjusted. Foley catheters had to be placed due to urinary retention. She had continued to complain of intermittent abdominal pain and nausea. Overall patient's oral intake had been minimal and therefore core track was placed with dietary consultation for tube feeds. GI was consulted and performed EGD which did not reveal any acute cause for patient's symptoms on 9/3. General surgery was consulted due to mildly distended gallbladder concerning for cholecystitis, but HIDA scan was negative. Due to lack of oral intake she underwent percutaneous gastric tube placement by IR on 9/4.Nephrology consulted after creatinine elevated to 3.48. They were concerned for likelyAIN vs post strep glomerulonephritis. On 9/7 patient developed projectile vomiting and hemoglobin dropped down to 6.8 requiring transfusion of PRBCs. Required transfer back into the ICU for close monitoring and palliative care was consulted at that time where family  made patient DNR/DNI. Patient completed antibiotics for endocarditis on 9/17. Patient developed low-grade fever on 9/18 up to 100.7 F. Urinalysis concerning for infection, but urine culture inconclusive. Labs on 9/22 show alkaline phosphatase 535, albumin 1.8, AST 433, ALT 77, and total bilirubin 0.7. TRH called help with overall medical management.  9/30: N/V ON. CT ordered. 10/1: N/V improved. TFs held. Start back and slow rate today. 10/2: TF resumed. No further documentation of vomiting. 10/3: No acute events ON. Needs COVID test today. 10/4: looking good this morning. COVID pending; to SNF tomorrow? 10/5: Cefepime to end today. During examination, significant vomiting. TF held.   Assessment & Plan:   Principal Problem:   Staphylococcus aureus bacteremia with sepsis ([place]) Active Problems:   Severe sepsis ([place])   Bacteremia due to methicillin susceptible Staphylococcus aureus (MSSA)   Aortic valve endocarditis   Acute respiratory failure with hypoxia ([name])   S/P AVR (aortic valve replacement)   Pressure injury of skin   Palliative care by specialist   Adult failure to thrive   Weakness generalized   Severe hypoalbuminemia with anasarca - Patient still with poor appetite.  - Albumin levels around 1.8.  - Suspect patient likely third spacing due to poor nutritional status. - tube feed per dietary - TF resumed; no new issues reported - continue TF; don't know that we will get much better     - 10/5: had significant vomiting during interview; TF held; reports that she's vomited multiple times this AM  Acute kidney injury Mild Hypernatremia - Patientwith a creatinine staying around 3 with decreased overall urine output.  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  Elevated Liver enzymes - AST mildly elevated on 9/20 to 61, alk phos was 334 with normal bilirubin.  - 9/22 rose to AST 433 and alk phos 535 with ALT 77. Downtrending since then. - Unclear cause at this time. Per GI c/s note from 9/23, suspected drug induced liver injury (concern for rifampin). - Crestor has been discontinued  - Last abdominal ultrasound from 9/1 noted mildly distended gallbladder with 15 mm gallstone and small amount of sludge. HIDA scan was negative for signs of acute cholecystitis at that time.     - AST/ALT has normalized; alk phos is still downtrending   Hypothyroidism - Last TSH noted to be 19.826 on 10/30/2018. - Recheck TSH(8.35 appears to be trending down appropriately) - Continue levothyroxine  Anemia Throbocytopenia - Stable; monitor  Leukocytosis - mild, stable, continue to monitor - reactive to vomiting? - CT ab/pelvis sees opacities in LLL PNA; HAP? would need HAP coverage, consult pharm for dosing - started treating HAP; white count improved; decreased N/V - persistent; reactive?     - just about completed with abx for PNA; I do not see what this leukocytosis is correlating to; monitor clinically  Cognitive delay - Stable. - unable to take care of herself; will need NH/SNF at d/c  HAP/LLL PNA - vanc/cefepime     - vanc d/c'd - END abx 10/5  Nausea Vomiting - this has been intermittent.  - Continue reglan. - repeat CT abdomen pelvis: without  any specific GI abnormality - tolerating TF; is taking PO supplementation as well; continue  Significant vomiting today. She's already on max dose reglan. LFTs are improved. I don't know what else we can add here. Continue to wean O2. Hold d/c today. Cefepime to end today.  DVT prophylaxis:SCDs Code Status:DNR Disposition Plan:TBD  Consultants:  TCTS  Antimicrobials:   Cefepime   ROS:  Denies CP, dyspnea. Reports N, V. Remainder 10-pt ROS is negative for all not previously mentioned.  Subjective: "I want water."  Objective: Vitals:   12/05/18 0320 12/05/18 0530 12/05/18 0832 12/05/18 1259  BP: (!) 145/76  138/82 (!) 142/85  Pulse: 93 87 93 90  Resp: (!) 27 (!) 23 20 (!) 22  Temp: 98.5 F (36.9 C)  97.6 F (36.4 C) 98.7 F (37.1 C)  TempSrc: Oral  Oral Oral  SpO2: 98% 98% 93% 96%  Weight:  89 kg    Height:        Intake/Output Summary (Last 24 hours) at 12/05/2018 1517 Last data filed at 12/05/2018 0900 Gross per 24 hour  Intake 1395 ml  Output --  Net 1395 ml   Filed Weights   12/03/18 0529 12/04/18 0452 12/05/18 0530  Weight: 86.5 kg 87.7 kg 89 kg    Examination:  General:58 y.o.femaleresting in bed in NAD Cardiovascular: RRR, +S1, S2, no m/g/r, equal pulses throughout Respiratory:shallow breaths, decreased at bases GI: BS+, NDNT, no masses noted, no organomegaly noted MSK: No c/c; BLE edema Skin: No rashes, bruises, ulcerations noted Neuro: Alert to name, follows commands   Data Reviewed: I have personally reviewed following  labs and imaging studies.  CBC: Recent Labs  Lab 11/30/18 0333 12/01/18 0448 12/02/18 0331 12/04/18 0500 12/05/18 0329  WBC 18.1* 13.4* 16.1* 18.6* 16.5*  NEUTROABS  --  10.5* 12.2* 15.1* 13.1*  HGB 8.4* 7.9* 8.1* 8.1* 7.6*  HCT 28.1* 26.4* 28.1* 27.6* 25.1*  MCV 101.1* 101.5* 102.2* 100.7* 100.0  PLT 78* 91* 103* 86* 96*   Basic Metabolic Panel: Recent Labs  Lab 11/30/18 0333 12/01/18 0448  12/02/18 0331 12/04/18 0500 12/05/18 0329 12/05/18 0330  NA 148* 150* 147* 146*  --  144  K 5.3* 4.8 4.5 4.2  --  4.1  CL 106 110 106 107  --  105  CO2 29 32 31 29  --  30  GLUCOSE 112* 95 138* 107*  --  111*  BUN 83* 78* 81* 64*  --  62*  CREATININE 2.46* 2.28* 2.28* 1.82*  --  1.78*  CALCIUM 9.0 9.2 9.0 8.7*  --  8.7*  MG 2.4 2.6* 2.4 2.1 2.1  --   PHOS  --  5.4* 5.1* 3.6  --  3.9   GFR: Estimated Creatinine Clearance: 36.2 mL/min (A) (by C-G formula based on SCr of 1.78 mg/dL (H)). Liver Function Tests: Recent Labs  Lab 11/30/18 0333 12/01/18 0448 12/02/18 0331 12/04/18 0500 12/05/18 0330 12/05/18 1252  AST 44*  --   --   --   --  29  ALT 35  --   --   --   --  28  ALKPHOS 323*  --   --   --   --  236*  BILITOT 0.7  --   --   --   --  0.5  PROT 5.6*  --   --   --   --  5.8*  ALBUMIN 2.1* 2.1* 2.1* 2.0* 2.0* 2.0*   Recent Labs  Lab 12/05/18 1252  LIPASE 27   No results for input(s): AMMONIA in the last 168 hours. Coagulation Profile: No results for input(s): INR, PROTIME in the last 168 hours. Cardiac Enzymes: No results for input(s): CKTOTAL, CKMB, CKMBINDEX, TROPONINI in the last 168 hours. BNP (last 3 results) No results for input(s): PROBNP in the last 8760 hours. HbA1C: No results for input(s): HGBA1C in the last 72 hours. CBG: Recent Labs  Lab 12/03/18 1953 12/04/18 1136 12/04/18 1946 12/05/18 0520 12/05/18 1256  GLUCAP 126* 145* 124* 125* 117*   Lipid Profile: No results for input(s): CHOL, HDL, LDLCALC, TRIG, CHOLHDL, LDLDIRECT in the last 72 hours. Thyroid Function Tests: No results for input(s): TSH, T4TOTAL, FREET4, T3FREE, THYROIDAB in the last 72 hours. Anemia Panel: No results for input(s): VITAMINB12, FOLATE, FERRITIN, TIBC, IRON, RETICCTPCT in the last 72 hours. Sepsis Labs: No results for input(s): PROCALCITON, LATICACIDVEN in the last 168 hours.  Recent Results (from the past 240 hour(s))  MRSA PCR Screening     Status: None    Collection Time: 12/02/18  6:33 PM   Specimen: Nasopharyngeal  Result Value Ref Range Status   MRSA by PCR NEGATIVE NEGATIVE Final    Comment:        The GeneXpert MRSA Assay (FDA approved for NASAL specimens only), is one component of a comprehensive MRSA colonization surveillance program. It is not intended to diagnose MRSA infection nor to guide or monitor treatment for MRSA infections. Performed at [facility], [place] [address], [place], [place] [address]   Novel Coronavirus, NAA (hospital order; send-out to ref lab)     Status:  None   Collection Time: 12/03/18  1:53 PM   Specimen: Nasopharyngeal Swab; Respiratory  Result Value Ref Range Status   SARS-CoV-2, NAA NOT DETECTED NOT DETECTED Final    Comment: (NOTE) This nucleic acid amplification test was developed and its performance characteristics determined by [employer]. Nucleic acid amplification tests include PCR and TMA. This test has not been FDA cleared or approved. This test has been authorized by FDA under an Emergency Use Authorization (EUA). This test is only authorized for the duration of time the declaration that circumstances exist justifying the authorization of the emergency use of in vitro diagnostic tests for detection of SARS-CoV-2 virus and/or diagnosis of COVID-19 infection under section 564(b)(1) of the Act, 21 U.S.C. 409WJX-9(J) (1), unless the authorization is terminated or revoked sooner. When diagnostic testing is negative, the possibility of a false negative result should be considered in the context of a patient's recent exposures and the presence of clinical signs and symptoms consistent with COVID-19. An individual without symptoms of COVID- 19 and who is not shedding SARS-CoV-2 vi rus would expect to have a negative (not detected) result in this assay. Performed At: [facility] [address] [place], [place] [id number] Rush Farmer MD HY:[id number]     [place]  Final    Comment: Performed at [facility], [place] [address] [place], [place] [address]      Radiology Studies: No results found.   Scheduled Meds:  amLODipine  10 mg Per Tube Daily   calcium-vitamin D  1 tablet Oral Q breakfast   Chlorhexidine Gluconate Cloth  6 each Topical Daily   feeding supplement (PRO-STAT SUGAR FREE 64)  30 mL Per Tube TID   FLUoxetine  10 mg Per Tube Daily   free water  200 mL Per Tube Q8H   levothyroxine  150 mcg Oral Q0600   mouth rinse  15 mL Mouth Rinse BID   metoCLOPramide (REGLAN) injection  10 mg Intravenous Q6H   metoprolol tartrate  25 mg Per Tube BID   montelukast  10 mg Oral QHS   multivitamin with minerals  1 tablet Oral Daily   pantoprazole sodium  40 mg Per Tube Daily   polyethylene glycol  17 g Per Tube Q M,W,F   sodium chloride flush  3 mL Intravenous Q12H   tamsulosin  0.4 mg Oral Daily   Continuous Infusions:  sodium chloride 500 mL (11/30/18 1741)   ceFEPime (MAXIPIME) IV 2 g (12/04/18 1725)   feeding supplement (OSMOLITE 1.5 CAL) 45 mL/hr at 12/05/18 1226     LOS: 65 days    Time spent: 25 minutes spent in the coordination of care today.   Jonnie Finner, DO [facility] Pager [phone]  If 7PM-7AM, please contact night-coverage [email or web address] Password [email or web address] 12/05/2018, 3:17 PM

## 2018-12-06 ENCOUNTER — Telehealth: Payer: Self-pay

## 2018-12-06 LAB — RENAL FUNCTION PANEL
Albumin: 1.8 g/dL — ABNORMAL LOW (ref 3.5–5.0)
Anion gap: 9 (ref 5–15)
BUN: 64 mg/dL — ABNORMAL HIGH (ref 6–20)
CO2: 30 mmol/L (ref 22–32)
Calcium: 8.8 mg/dL — ABNORMAL LOW (ref 8.9–10.3)
Chloride: 106 mmol/L (ref 98–111)
Creatinine, Ser: 1.83 mg/dL — ABNORMAL HIGH (ref 0.44–1.00)
GFR calc Af Amer: 35 mL/min — ABNORMAL LOW (ref >60)
GFR calc non Af Amer: 30 mL/min — ABNORMAL LOW (ref >60)
Glucose, Bld: 144 mg/dL — ABNORMAL HIGH (ref 70–99)
Phosphorus: 4.5 mg/dL (ref 2.5–4.6)
Potassium: 4.4 mmol/L (ref 3.5–5.1)
Sodium: 145 mmol/L (ref 135–145)

## 2018-12-06 LAB — CBC WITH DIFFERENTIAL/PLATELET
Abs Immature Granulocytes: 0.15 10*3/uL — ABNORMAL HIGH (ref 0.00–0.07)
Basophils Absolute: 0.1 10*3/uL (ref 0.0–0.1)
Basophils Relative: 0 %
Eosinophils Absolute: 1.1 10*3/uL — ABNORMAL HIGH (ref 0.0–0.5)
Eosinophils Relative: 7 %
HCT: 25.5 % — ABNORMAL LOW (ref 36.0–46.0)
Hemoglobin: 7.5 g/dL — ABNORMAL LOW (ref 12.0–15.0)
Immature Granulocytes: 1 %
Lymphocytes Relative: 4 %
Lymphs Abs: 0.6 10*3/uL — ABNORMAL LOW (ref 0.7–4.0)
MCH: 30.1 pg (ref 26.0–34.0)
MCHC: 29.4 g/dL — ABNORMAL LOW (ref 30.0–36.0)
MCV: 102.4 fL — ABNORMAL HIGH (ref 80.0–100.0)
Monocytes Absolute: 0.9 10*3/uL (ref 0.1–1.0)
Monocytes Relative: 5 %
Neutro Abs: 13.5 10*3/uL — ABNORMAL HIGH (ref 1.7–7.7)
Neutrophils Relative %: 83 %
Platelets: 94 10*3/uL — ABNORMAL LOW (ref 150–400)
RBC: 2.49 MIL/uL — ABNORMAL LOW (ref 3.87–5.11)
RDW: 16.7 % — ABNORMAL HIGH (ref 11.5–15.5)
WBC: 16.3 10*3/uL — ABNORMAL HIGH (ref 4.0–10.5)
nRBC: 0 % (ref 0.0–0.2)

## 2018-12-06 LAB — MAGNESIUM: Magnesium: 2.2 mg/dL (ref 1.7–2.4)

## 2018-12-06 LAB — GLUCOSE, CAPILLARY
Glucose-Capillary: 122 mg/dL — ABNORMAL HIGH (ref 70–99)
Glucose-Capillary: 114 mg/dL — ABNORMAL HIGH (ref 70–99)
Glucose-Capillary: 135 mg/dL — ABNORMAL HIGH (ref 70–99)

## 2018-12-06 LAB — SURGICAL PATHOLOGY

## 2018-12-06 MED ORDER — PRO-STAT SUGAR FREE PO LIQD: 30.0000 mL | Freq: Three times a day (TID) | ORAL | 0 refills | Status: AC

## 2018-12-06 MED ORDER — PANTOPRAZOLE SODIUM 40 MG PO PACK: 40.0000 mg | PACK | Freq: Every day | ORAL | Status: DC

## 2018-12-06 MED ORDER — LEVOTHYROXINE SODIUM 150 MCG PO TABS: 150.0000 ug | ORAL_TABLET | Freq: Every day | ORAL | Status: DC

## 2018-12-06 MED ORDER — FLUOXETINE HCL 10 MG PO TABS: 10.0000 mg | ORAL_TABLET | Freq: Every day | ORAL | 3 refills | Status: AC

## 2018-12-06 MED ORDER — METOCLOPRAMIDE HCL 10 MG PO TABS
10.0000 mg | ORAL_TABLET | Freq: Three times a day (TID) | ORAL | Status: DC
  Administered 2018-12-06 (×2): 10 mg via ORAL
  Filled 2018-12-06 (×2): qty 1

## 2018-12-06 MED ORDER — OXYCODONE HCL 5 MG/5ML PO SOLN: 5.0000 mg | ORAL | 0 refills | Status: AC | PRN

## 2018-12-06 MED ORDER — TAMSULOSIN HCL 0.4 MG PO CAPS: 0.4000 mg | ORAL_CAPSULE | Freq: Every day | ORAL | Status: AC

## 2018-12-06 MED ORDER — METOPROLOL TARTRATE 25 MG PO TABS: 25.0000 mg | ORAL_TABLET | Freq: Two times a day (BID) | ORAL | 0 refills | Status: AC

## 2018-12-06 MED ORDER — POLYETHYLENE GLYCOL 3350 17 G PO PACK: 17.0000 g | PACK | ORAL | 0 refills | Status: AC

## 2018-12-06 MED ORDER — AMLODIPINE BESYLATE 10 MG PO TABS: 10.0000 mg | ORAL_TABLET | Freq: Every day | ORAL | 0 refills | Status: DC

## 2018-12-06 MED ORDER — METOCLOPRAMIDE HCL 10 MG PO TABS: 10.0000 mg | ORAL_TABLET | Freq: Three times a day (TID) | ORAL | Status: AC

## 2018-12-06 MED ORDER — FREE WATER: 200.0000 mL | Freq: Three times a day (TID) | Status: AC

## 2018-12-06 MED ORDER — OSMOLITE 1.5 CAL PO LIQD: 1000.0000 mL | ORAL | 0 refills | Status: AC

## 2018-12-06 MED ORDER — PROMETHAZINE HCL 25 MG RE SUPP: 25.0000 mg | Freq: Four times a day (QID) | RECTAL | 0 refills | Status: AC | PRN

## 2018-12-06 MED ORDER — ROSUVASTATIN CALCIUM 5 MG PO TABS: 5.0000 mg | ORAL_TABLET | Freq: Every day | ORAL | Status: AC

## 2018-12-06 MED ORDER — TRAMADOL HCL 50 MG PO TABS: 50.0000 mg | ORAL_TABLET | Freq: Four times a day (QID) | ORAL | 0 refills | Status: AC | PRN

## 2018-12-06 NOTE — TOC Transition Note (Addendum)
Transition of Care [facility]) - CM/SW Discharge Note   Patient Details  Name: Audrey Collier MRN: [id number] Date of Birth: 1960-05-17  Transition of [facility]) CM/SW Contact:  Vinie Sill, [place] Phone Number: 12/06/2018, 1:35 PM   Clinical Narrative:     Patient will DC to: [facility] Date: 12/06/2018 Family Notified: Patrice Paradise, Aunt  Transport WN:[id number] @ 3:00pm  RN, patient, and facility notified of DC. Discharge Summary sent to facility. RN given number for [phone], Room 203-A. Ambulance transport requested for patient.   Clinical Social Worker signing off.  [name], MSW, [facility] Clinical Social Worker [phone]    Final next level of care: Skilled Nursing Facility Barriers to Discharge: Barriers Resolved   Patient Goals and CMS Choice Patient states their goals for this hospitalization and ongoing recovery are:: family states- so she can get better and come back home CMS Medicare.gov Compare Post Acute Care list provided to:: Patient Represenative (must comment) Choice offered to / list presented to : [name])  Discharge Placement PASRR number recieved: 11/11/18            Patient chooses bed at: [facility] Patient to be transferred to facility by: [place] Name of family member notified: [name] Patient and family notified of of transfer: 12/06/18  Discharge Plan and Services In-house Referral: Clinical Social Work Discharge Planning Services: NA Post Acute Care Choice: [place]          DME Arranged: N/A DME Agency: NA                  Social Determinants of Health (SDOH) Interventions     Readmission Risk Interventions No flowsheet data found.

## 2018-12-06 NOTE — Discharge Summary (Signed)
. Physician Discharge Summary  Audrey Collier JME:[id number] DOB: 1960/05/26 DOA: 10/01/2018  PCP: Jani Gravel, MD  Admit date: 10/01/2018 Discharge date: 12/06/2018  Admitted From: Home Disposition:  Discharged to SNF  Recommendations for Outpatient Follow-up:  1. Follow up with PCP in 1 weeks 2. Please obtain BMP/CBC in one week  Discharge Condition: Stable  CODE STATUS: DNR  Diet recommendation: See below.  Brief/Interim Summary: Audrey Collier is a 58 year old female with past medical history significant ofcognitive delay, anxiety, hypothyroidism, and post menopausal bleeding (worked up 5 years ago), whopresented on 8/1with altered mental status from group homewith fever up to 103.8 F and emesis. Chest x-ray showed bilateral pneumonia and patient was noted to be COVID-19 negative x2. Treated initially with Rocephin and azithromycin for suspected community-acquired pneumonia. Blood cultures grew out [facility] patient was switched to Ancef with rifampin added per ID. Patient required intubation on 8/4,due to respiratory failure and care was taken over by PCCM. On 8/6 patient underwent TEE showing large aortic vegetation. CT surgery was consulted,and she was taken for aortic valve replacement on 10/07/2018. Postop patient noted to have renal insufficiency and antibiotics were adjusted. Foley catheters had to be placed due to urinary retention. She had continued to complain of intermittent abdominal pain and nausea. Overall patient's oral intake had been minimal and therefore core track was placed with dietary consultation for tube feeds. GI was consulted and performed EGD which did not reveal any acute cause for patient's symptoms on 9/3. General surgery was consulted due to mildly distended gallbladder concerning for cholecystitis, but HIDA scan was negative. Due to lack of oral intake she underwent percutaneous gastric tube placement by IR on 9/4.Nephrology consulted after  creatinine elevated to 3.48. They were concerned for likelyAIN vs post strep glomerulonephritis. On 9/7 patient developed projectile vomiting and hemoglobin dropped down to 6.8 requiring transfusion of PRBCs. Required transfer back into the ICU for close monitoring and palliative care was consulted at that time where family made patient DNR/DNI. Patient completed antibiotics for endocarditis on 9/17. Patient developed low-grade fever on 9/18 up to 100.7 F. Urinalysis concerning for infection, but urine culture inconclusive. Labs on 9/22 show alkaline phosphatase 535, albumin 1.8, AST 433, ALT 77, and total bilirubin 0.7. TRH called help with overall medical management.  Discharge Diagnoses:  Principal Problem:   Staphylococcus aureus bacteremia with sepsis ([place]) Active Problems:   Severe sepsis ([place])   Bacteremia due to methicillin susceptible Staphylococcus aureus (MSSA)   Aortic valve endocarditis   Acute respiratory failure with hypoxia ([name])   S/P AVR (aortic valve replacement)   Pressure injury of skin   Palliative care by specialist   Adult failure to thrive   Weakness generalized  Severe hypoalbuminemia with anasarca - Patient still with poor appetite.  - Albumin levels around 1.8.  - Suspect patient likely third spacing due to poor nutritional status. - tube feed per dietary - TF resumed; no new issues reported - continue TF; don't know that we will get much better     - stable     - per nutrition: INTERVENTION: Continue tube feeding: - Osmolite 1.5 @ 45 ml/hr x 24 hours - 30 ml Prostat TID   Acute kidney injury Mild Hypernatremia - Patientwith a creatinine staying around 3 with decreased overall urine output.  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  Elevated Liver enzymes - AST mildly elevated on 9/20 to 61, alk phos was 334 with normal bilirubin.  - 9/22 rose to AST 433 and alk phos 535 with ALT 77. Downtrending since then. - Unclear cause at this time. Per GI c/s note from 9/23, suspected drug induced liver injury (concern for rifampin). - Crestor has been discontinued  - Last abdominal ultrasound from 9/1 noted mildly distended gallbladder with 15 mm gallstone and small amount of sludge. HIDA scan was negative for signs of acute cholecystitis at that time.     - AST/ALT has normalized; alk phos is still downtrending   Hypothyroidism - Last TSH noted to be 19.826 on 10/30/2018. - Recheck TSH(8.35 appears to be trending down appropriately) - Continue levothyroxine  Anemia Throbocytopenia - Stable; monitor  Leukocytosis - mild, stable, continue to monitor - reactive to vomiting? - CT ab/pelvis sees opacities in LLL PNA; HAP? would need HAP coverage, consult pharm for dosing - started treating HAP; white count improved; decreased N/V - persistent; reactive? - just about completed with abx for PNA; I do not see what this leukocytosis is correlating to; monitor clinically  Cognitive delay - Stable. - unable to take care of herself; will need NH/SNF at d/c  HAP/LLL PNA - vanc/cefepime - vanc d/c'd - END abx 10/5  Nausea Vomiting - this has been intermittent.  - Continue reglan. - repeat CT  abdomen pelvis: without any specific GI abnormality -tolerating TF; is taking PO supplementation as well; continue     - convert reglan to PO, has PO zofran (responds well); will also have PR phenergan     - toleratinve TF  Discharge Instructions  Discharge Instructions    Home infusion instructions [facility] May follow [place] Dosing Protocol; May administer Cathflo as needed to maintain patency of vascular access device.; Flushing of vascular access device: per [facility] Protocol: 0.9% NaCl pre/post medica...   Complete by: As directed    Instructions: May follow [place] Dosing Protocol   Instructions: May administer Cathflo as needed to maintain patency of vascular access device.   Instructions: Flushing of vascular access device: per [facility] Protocol: 0.9% NaCl pre/post medication administration and prn patency; Heparin 100 u/ml, 45m for implanted ports and Heparin 10u/ml, 545mfor all other central venous catheters.   Instructions: May follow [facility] Anaphylaxis Protocol for First Dose Administration in the home: 0.9% NaCl at 25-50 ml/hr to maintain IV access for protocol meds. Epinephrine 0.3 ml IV/IM PRN and Benadryl 25-50 IV/IM PRN s/s of anaphylaxis.   Instructions: [facility]elsnfusion Coordinator (RN) to assist per patient IV care needs in the home PRN.     Allergies as of 12/06/2018   No Known Allergies     Medication List    STOP taking these medications   CALCIUM + D PO   cetirizine 10 MG tablet Commonly known as: ZYRTEC   fluticasone 50 MCG/ACT nasal spray Commonly known as: FLONASE   montelukast 10 MG tablet Commonly known as:  SINGULAIR   multivitamin tablet   omeprazole 20 MG capsule Commonly known as: PRILOSEC   Stool Softener 100 MG capsule Generic drug: docusate sodium     TAKE these medications   amLODipine 10 MG tablet Commonly known as: NORVASC Place 1 tablet (10 mg total) into feeding tube daily. Start taking on: December 07, 2018    diclofenac sodium 1 % Gel Commonly known as: VOLTAREN Apply 1 application topically every 6 (six) hours as needed (heel pain).   feeding supplement (OSMOLITE 1.5 CAL) Liqd Place 1,000 mLs into feeding tube continuous.   feeding supplement (PRO-STAT SUGAR FREE 64) Liqd Place 30 mLs into feeding tube 3 (three) times daily.   FLUoxetine 10 MG tablet Commonly known as: PROZAC Place 1 tablet (10 mg total) into feeding tube at bedtime. What changed: how to take this   free water Soln Place 200 mLs into feeding tube every 8 (eight) hours.   levothyroxine 150 MCG tablet Commonly known as: SYNTHROID Place 1 tablet (150 mcg total) into feeding tube daily at 6 (six) AM. Start taking on: December 07, 2018 What changed:   medication strength  how much to take  how to take this  when to take this   metoCLOPramide 10 MG tablet Commonly known as: REGLAN Place 1 tablet (10 mg total) into feeding tube 4 (four) times daily -  before meals and at bedtime.   metoprolol tartrate 25 MG tablet Commonly known as: LOPRESSOR Place 1 tablet (25 mg total) into feeding tube 2 (two) times daily.   oxyCODONE 5 MG/5ML solution Commonly known as: ROXICODONE Place 5 mLs (5 mg total) into feeding tube every 4 (four) hours as needed for up to 3 days for severe pain.   pantoprazole sodium 40 mg/20 mL Pack Commonly known as: PROTONIX Place 20 mLs (40 mg total) into feeding tube daily.   polyethylene glycol 17 g packet Commonly known as: MIRALAX / GLYCOLAX Place 17 g into feeding tube every Monday, Wednesday, and Friday. Start taking on: December 07, 2018   promethazine 25 MG suppository Commonly known as: PHENERGAN Place 1 suppository (25 mg total) rectally every 6 (six) hours as needed for nausea, vomiting or refractory nausea / vomiting.   rosuvastatin 5 MG tablet Commonly known as: Crestor Place 1 tablet (5 mg total) into feeding tube daily. What changed: how to take this   tamsulosin 0.4 MG  Caps capsule Commonly known as: FLOMAX Take 1 capsule (0.4 mg total) by mouth daily. Start taking on: December 07, 2018   traMADol 50 MG tablet Commonly known as: ULTRAM Take 1 tablet (50 mg total) by mouth every 6 (six) hours as needed for up to 5 days for moderate pain.            Home Infusion Instuctions  (From admission, onward)         Start     Ordered   10/20/18 0000  Home infusion instructions [facility] May follow [place] Dosing Protocol; May administer Cathflo as needed to maintain patency of vascular access device.; Flushing of vascular access device: per [facility] Protocol: 0.9% NaCl pre/post medica...    Question Answer Comment  Instructions May follow [place] Dosing Protocol   Instructions May administer Cathflo as needed to maintain patency of vascular access device.   Instructions Flushing of vascular access device: per [facility] Protocol: 0.9% NaCl pre/post medication administration and prn patency; Heparin 100 u/ml, 110m for implanted ports and Heparin 10u/ml, 55mfor all other  central venous catheters.   Instructions May follow [facility] Anaphylaxis Protocol for First Dose Administration in the home: 0.9% NaCl at 25-50 ml/hr to maintain IV access for protocol meds. Epinephrine 0.3 ml IV/IM PRN and Benadryl 25-50 IV/IM PRN s/s of anaphylaxis.   Instructions [facility] Infusion Coordinator (RN) to assist per patient IV care needs in the home PRN.      10/20/18 1001         Follow-up Information    Lajuana Matte, MD Follow up on 10/28/2018.   Specialty: Thoracic Surgery Contact information: [address] [place] [address] [phone]        Tommy Medal, Lavell Islam, MD. Go on 11/09/2018.   Specialty: Infectious Diseases Why: Appointment time is at 10:30 am. Please arrive 15 minutes early Contact information: [address] [place] [address] [phone]        Surgery, [place]. Call.   Specialty: General  Surgery Why: Call and schedule an appointment if developing symptoms of gallbladder disease.  Contact information: [address] [place] [address] [phone]          No Known Allergies  Consultations:  TCTS   Procedures/Studies: Ct Abdomen Pelvis Wo Contrast  Result Date: 11/30/2018 CLINICAL DATA:  Nausea, vomiting EXAM: CT ABDOMEN AND PELVIS WITHOUT CONTRAST TECHNIQUE: Multidetector CT imaging of the abdomen and pelvis was performed following the standard protocol without IV contrast. Sagittal and coronal MPR images reconstructed from axial data set. Dilute oral contrast administered COMPARISON:  11/07/2018 FINDINGS: Lower chest: Small to moderate RIGHT pleural effusion. Compressive atelectasis of RIGHT lower lobe. Additional airspace infiltrates throughout LEFT lower lobe and minimally in RIGHT lower lobe. Hepatobiliary: Gallbladder and liver unremarkable Pancreas: Partial fatty replacement. No definite focal abnormalities Spleen: Normal appearance Adrenals/Urinary Tract: Normal appearance of adrenal glands. Kidneys and ureters unremarkable. Mild bladder wall thickening though bladder is underdistended, potentially artifactual. Stomach/Bowel: Gastrostomy tube. Appendix not visualized. Stomach and bowel loops otherwise normal appearance. Vascular/Lymphatic: Aorta normal caliber. Retroaortic LEFT renal vein. No adenopathy Reproductive: Atrophic uterus with unremarkable adnexa Other: Scattered ascites throughout abdomen and pelvis. No free air. Tiny umbilical hernia containing fat. Scattered subcutaneous edema. No focal abnormal fluid collection to suggest abscess. Musculoskeletal: Unremarkable IMPRESSION: Scattered ascites. Tiny umbilical hernia containing fat. RIGHT pleural effusion with basilar atelectasis and significant LEFT lower lobe consolidation/pneumonia. Electronically Signed   By: Lavonia Dana M.D.   On: 11/30/2018 15:27   Ct Abdomen Pelvis Wo Contrast  Result  Date: 11/07/2018 CLINICAL DATA:  Abdominal pain and bleeding after percutaneous gastrostomy tube placement. EXAM: CT ABDOMEN AND PELVIS WITHOUT CONTRAST TECHNIQUE: Multidetector CT imaging of the abdomen and pelvis was performed following the standard protocol without IV contrast. COMPARISON:  Percutaneous gastrostomy tube placement-11/04/2018 FINDINGS: The lack of intravenous contrast limits the ability to evaluate solid abdominal organs. Lower chest: Limited visualization of the lower thorax demonstrates a small right and trace left-sided pleural effusions with associated bibasilar heterogeneous/consolidative opacities, similar to the 10/31/2018 examination. Borderline cardiomegaly. There is diffuse decreased attenuation intra cardiac blood pool suggestive of anemia. There is a minimal amount of pericardial thickening without associated effusion. Post median sternotomy with adjacent stranding. Hepatobiliary: Normal hepatic contour. Normal noncontrast appearance of the gallbladder given degree distention. No radiopaque gallstones. There is a trace amount of perihepatic ascites, similar to the 10/31/2018 examination. Pancreas: Normal noncontrast appearance of the pancreas. Spleen: Normal noncontrast appearance of the spleen. Adrenals/Urinary Tract: Mild bilateral pelviectasis without associated  caliectasis, left greater than right, similar to the 10/2018 examination. Minimal amount of grossly symmetric bilateral perinephric stranding, likely age and body habitus related. No renal stones. No renal stones are seen along expected course of either ureter or the urinary bladder. There is mild thickening of the left adrenal gland without discrete nodule. Normal noncontrast appearance of the right adrenal gland. Thickening of the urinary bladder wall, potentially accentuated due to underdistention in the setting of a Foley catheter. Stomach/Bowel: Interval placement disc retention gastrostomy tube which appears  appropriately positioned within the stomach. There are scattered foci of pneumoperitoneum within the upper abdomen presumably the sequela of recent gastrostomy tube placement. Radiopaque enteric contrast has been ingested and is seen extending to the level of the transverse colon. Large stool burden within the rectal vault without evidence of enteric obstruction. The cecum is noted to be located within the right mid hemiabdomen. Normal appearance of the chest at normal noncontrast appearance of the terminal ileum and appendix. No discrete areas of bowel wall thickening. No pneumatosis or portal venous gas. Vascular/Lymphatic: Normal caliber the abdominal aorta. 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 No bulky retroperitoneal, mesenteric, pelvic or inguinal lymphadenopathy on this noncontrast examination. Reproductive: Normal noncontrast appearance of the pelvic organs for age. No discrete adnexal lesion. Small amount of free fluid within the pelvic cul-de-sac. Other: Diffuse body wall anasarca. Musculoskeletal: No acute or aggressive osseous abnormalities. IMPRESSION: 1. Appropriately positioned percutaneous gastrostomy tube without definitive evidence of complication on this noncontrast examination. There is a small amount of residual pneumoperitoneum however this is considered to be an expected finding following percutaneous gastrostomy tube placement performed 11/04/2019. 2. Large stool burden within the rectal vault without evidence of enteric obstruction. 3. Cardiomegaly with findings suggestive of pulmonary edema including small right and trace left-sided pleural effusions, diffuse body wall anasarca and small amount of intra-abdominal ascites. 4. The urinary bladder remains decompressed via Foley catheter though appears thick wall with associated mild bilateral pelviectasis, nonspecific though could be seen in the setting urinary tract infection. Correlation with  urinalysis is advised. Electronically Signed   By: Sandi Mariscal M.D.   On: 11/07/2018 14:25   Dg Chest 1 View  Result Date: 11/22/2018 CLINICAL DATA:  Vomiting. Shortness of breath. EXAM: CHEST  1 VIEW COMPARISON:  11/18/2018 FINDINGS: There are worsening bibasilar airspace opacities when compared to prior study. There is a persistent small right-sided pleural effusion and a trace to small left-sided pleural effusion. There is generalized volume overload. The left-sided PICC line is stable. The heart size remains enlarged. The patient is status post prior median sternotomy. IMPRESSION: 1. Worsening bibasilar airspace opacities and small bilateral pleural effusions. Findings may be secondary to atelectasis with aspiration not excluded. 2. Stable cardiomegaly. Findings are compatible with congestive heart failure/fluid overload. Electronically Signed   By: Constance Holster M.D.   On: 11/22/2018 11:57   Dg Chest Port 1 View  Result Date: 11/30/2018 CLINICAL DATA:  Pneumonia. EXAM: PORTABLE CHEST 1 VIEW COMPARISON:  11/22/2018 FINDINGS: Heart size remains enlarged. Left-sided PICC line terminates in upper right atrium. Signs of median sternotomy for aortic valve replacement. Graded opacity in the right chest and diffuse interstitial and airspace opacities worse on the left, worse than on the study of 11/22/2018. IMPRESSION: Worsening right effusion and left greater than right airspace disease/multifocal pneumonia in the chest compared to 11/22/2018, lung bases better characterized on recent abdominal CT. Electronically Signed   By: Jewel Baize.D.  On: 11/30/2018 16:56   Dg Chest Port 1 View  Result Date: 11/18/2018 CLINICAL DATA:  Fever EXAM: PORTABLE CHEST 1 VIEW COMPARISON:  11/14/2018 FINDINGS: No significant change in AP portable examination with mild diffuse bilateral interstitial pulmonary opacity and a probable small layering right pleural effusion. There is no new or focal airspace opacity.  Cardiomegaly status post median sternotomy with aortic valve prosthesis. Left upper extremity PICC. IMPRESSION: No significant change in AP portable examination with mild diffuse bilateral interstitial pulmonary opacity consistent with edema or infection, and a probable small layering right pleural effusion. There is no new or focal airspace opacity. Electronically Signed   By: Eddie Candle M.D.   On: 11/18/2018 20:14   Dg Chest Port 1 View  Result Date: 11/14/2018 CLINICAL DATA:  58 year old female with chest pain after aortic valve replacement EXAM: PORTABLE CHEST 1 VIEW COMPARISON:  November 07, 2018 FINDINGS: Cardiomediastinal silhouette unchanged in size and contour. Surgical changes of median sternotomy and aortic valve replacement. Unchanged left upper extremity PICC. Similar appearance of low lung volumes and mild central vascular congestion with interlobular septal thickening. Blunting of the right costophrenic angle and cardiophrenic angle. Improved aeration at the left lung base. No displaced fracture IMPRESSION: Similar appearance of the chest x-ray with evidence of mild pulmonary edema and small right pleural effusion with improving aeration at the left lung base. Surgical changes of median sternotomy and aortic valve repair. Unchanged left upper extremity PICC Electronically Signed   By: Corrie Mckusick D.O.   On: 11/14/2018 07:54   Dg Chest Port 1 View  Result Date: 11/07/2018 CLINICAL DATA:  S/P AVR abdo pain with distention PPE: I donned gloves, surgical mask EXAM: PORTABLE CHEST 1 VIEW COMPARISON:  Chest radiograph 10/15/2018, 10/12/2018 FINDINGS: Stable cardiomediastinal contours status post median sternotomy and valvular replacement. Interval placement of a left upper extremity PICC with tip projecting over the cavoatrial junction. There are new bilateral diffuse patchy airspace opacities and small bilateral pleural effusions. No pneumothorax. No acute finding in the visualized skeleton.  IMPRESSION: 1. New bilateral diffuse patchy airspace opacities and small bilateral pleural effusions suspicious for pulmonary edema, infection not excluded. 2. Left upper extremity PICC tip projects over the cavoatrial junction. Electronically Signed   By: Audie Pinto M.D.   On: 11/07/2018 10:54   Dg Abd Portable 1v  Result Date: 11/07/2018 CLINICAL DATA:  Abdominal pain and distention. EXAM: PORTABLE ABDOMEN - 1 VIEW COMPARISON:  Plain films of the abdomen 10/21/2018. FINDINGS: Peg noted. The bowel gas pattern is normal. No radio-opaque calculi or other significant radiographic abnormality are seen. IMPRESSION: No acute abnormality. Electronically Signed   By: Inge Rise M.D.   On: 11/07/2018 09:28   US Biopsy (kidney)  Result Date: 11/23/2018 INDICATION: Acute renal insufficiency. Please perform ultrasound-guided renal biopsy for tissue diagnostic purposes. EXAM: ULTRASOUND GUIDED RENAL BIOPSY COMPARISON:  CT abdomen and pelvis - 10/31/2018 MEDICATIONS: None. ANESTHESIA/SEDATION: Fentanyl 25 mcg IV; Versed 1 mg IV Total Moderate Sedation time: 10 minutes; The patient was continuously monitored during the procedure by the interventional radiology nurse under my direct supervision. COMPLICATIONS: None immediate. PROCEDURE: Informed written consent was obtained from the patient's aunt after a discussion of the risks, benefits and alternatives to treatment. The patient understands and consents the procedure. A timeout was performed prior to the initiation of the procedure. Ultrasound scanning was performed of the bilateral flanks. The inferior pole of the left kidney was selected for biopsy due to location and sonographic window. The  procedure was planned. The operative site was prepped and draped in the usual sterile fashion. The overlying soft tissues were anesthetized with 1% lidocaine with epinephrine. A 17 gauge core needle biopsy device was advanced into the inferior cortex of the left kidney  and 2 core biopsies were obtained under direct ultrasound guidance. Images were saved for documentation purposes. The biopsy device was removed and hemostasis was obtained with manual compression. Post procedural scanning was negative for significant post procedural hemorrhage or additional complication. A dressing was placed. The patient tolerated the procedure well without immediate post procedural complication. IMPRESSION: Technically successful ultrasound guided left renal biopsy. Electronically Signed   By: Sandi Mariscal M.D.   On: 11/23/2018 15:06   US Abdomen Limited Ruq  Result Date: 11/07/2018 CLINICAL DATA:  58 year old female with right upper quadrant abdominal pain. EXAM: ULTRASOUND ABDOMEN LIMITED RIGHT UPPER QUADRANT COMPARISON:  Abdominal CT dated 11/07/2018 and ultrasound dated 11/01/2018 FINDINGS: Gallbladder: There is sludge and a 2 cm stone in the gallbladder. There is no gallbladder wall thickening. No sonographic Murphy's sign. Small pericholecystic fluid as well as perihepatic fluid noted. Common bile duct: Diameter: 5 mm Liver: Slightly nodular liver contour in keeping with early changes of cirrhosis. Portal vein is patent on color Doppler imaging with normal direction of blood flow towards the liver. Other: Partially visualized right pleural effusion. IMPRESSION: 1. Cholelithiasis without sonographic evidence of acute cholecystitis. 2. Probable early changes of cirrhosis. 3. Right-sided pleural effusion and small perihepatic ascites. Electronically Signed   By: Anner Crete M.D.   On: 11/07/2018 19:04      Subjective: "I didn't do it."  Discharge Exam: Vitals:   12/06/18 0900 12/06/18 1000  BP:    Pulse: 86 88  Resp:  (!) 23  Temp:    SpO2: 98% 98%   Vitals:   12/06/18 0800 12/06/18 0854 12/06/18 0900 12/06/18 1000  BP:  (!) 165/85    Pulse: 84 93 86 88  Resp:    (!) 23  Temp:  98.7 F (37.1 C)    TempSrc:  Oral    SpO2: 100% 99% 98% 98%  Weight:      Height:         General:58 y.o.femaleresting in bed in NAD Cardiovascular: RRR, +S1, S2, no m/g/r, equal pulses throughout Respiratory:shallow breaths, decreased at bases GI: BS+, NDNT, no masses noted, no organomegaly noted MSK: No c/c; BLE edema Skin: No rashes, bruises, ulcerations noted Neuro: Alertto name,followscommands    The results of significant diagnostics from this hospitalization (including imaging, microbiology, ancillary and laboratory) are listed below for reference.     Microbiology: Recent Results (from the past 240 hour(s))  MRSA PCR Screening     Status: None   Collection Time: 12/02/18  6:33 PM   Specimen: Nasopharyngeal  Result Value Ref Range Status   MRSA by PCR NEGATIVE NEGATIVE Final    Comment:        The GeneXpert MRSA Assay (FDA approved for NASAL specimens only), is one component of a comprehensive MRSA colonization surveillance program. It is not intended to diagnose MRSA infection nor to guide or monitor treatment for MRSA infections. Performed at [facility], [place] [address], [place], [place] [address]   Novel Coronavirus, NAA (hospital order; send-out to ref lab)     Status: None   Collection Time: 12/03/18  1:53 PM   Specimen: Nasopharyngeal Swab; Respiratory  Result Value Ref Range Status   SARS-CoV-2, NAA NOT DETECTED NOT DETECTED  Final    Comment: (NOTE) This nucleic acid amplification test was developed and its performance characteristics determined by [employer]. Nucleic acid amplification tests include PCR and TMA. This test has not been FDA cleared or approved. This test has been authorized by FDA under an Emergency Use Authorization (EUA). This test is only authorized for the duration of time the declaration that circumstances exist justifying the authorization of the emergency use of in vitro diagnostic tests for detection of SARS-CoV-2 virus and/or diagnosis of COVID-19 infection under section 564(b)(1) of the  Act, 21 U.S.C. 630ZSW-1(U) (1), unless the authorization is terminated or revoked sooner. When diagnostic testing is negative, the possibility of a false negative result should be considered in the context of a patient's recent exposures and the presence of clinical signs and symptoms consistent with COVID-19. An individual without symptoms of COVID- 19 and who is not shedding SARS-CoV-2 vi rus would expect to have a negative (not detected) result in this assay. Performed At: [facility] [address] [place], [place] [id number] Rush Farmer MD KG:[id number]    [place]  Final    Comment: Performed at [facility], [place] [address] [place], [place] [address]     Labs: BNP (last 3 results) No results for input(s): BNP in the last 8760 hours. Basic Metabolic Panel: Recent Labs  Lab 12/01/18 0448 12/02/18 0331 12/04/18 0500 12/05/18 0329 12/05/18 0330 12/06/18 0344  NA 150* 147* 146*  --  144 145  K 4.8 4.5 4.2  --  4.1 4.4  CL 110 106 107  --  105 106  CO2 32 31 29  --  30 30  GLUCOSE 95 138* 107*  --  111* 144*  BUN 78* 81* 64*  --  62* 64*  CREATININE 2.28* 2.28* 1.82*  --  1.78* 1.83*  CALCIUM 9.2 9.0 8.7*  --  8.7* 8.8*  MG 2.6* 2.4 2.1 2.1  --  2.2  PHOS 5.4* 5.1* 3.6  --  3.9 4.5   Liver Function Tests: Recent Labs  Lab 11/30/18 0333  12/02/18 0331 12/04/18 0500 12/05/18 0330 12/05/18 1252 12/06/18 0344  AST 44*  --   --   --   --  29  --   ALT 35  --   --   --   --  28  --   ALKPHOS 323*  --   --   --   --  236*  --   BILITOT 0.7  --   --   --   --  0.5  --   PROT 5.6*  --   --   --   --  5.8*  --   ALBUMIN 2.1*   < > 2.1* 2.0* 2.0* 2.0* 1.8*   < > = values in this interval not displayed.   Recent Labs  Lab 12/05/18 1252  LIPASE 27   No results for input(s): AMMONIA in the last 168 hours. CBC: Recent Labs  Lab 12/01/18 0448 12/02/18 0331 12/04/18 0500 12/05/18 0329 12/06/18 0344  WBC 13.4* 16.1*  18.6* 16.5* 16.3*  NEUTROABS 10.5* 12.2* 15.1* 13.1* 13.5*  HGB 7.9* 8.1* 8.1* 7.6* 7.5*  HCT 26.4* 28.1* 27.6* 25.1* 25.5*  MCV 101.5* 102.2* 100.7* 100.0 102.4*  PLT 91* 103* 86* 96* 94*   Cardiac Enzymes: No results for input(s): CKTOTAL, CKMB, CKMBINDEX, TROPONINI in the last 168 hours. BNP: Invalid input(s): POCBNP CBG: Recent Labs  Lab 12/04/18 1946 12/05/18 0520 12/05/18 1256 12/05/18  1649 12/05/18 2011  GLUCAP 124* 125* 117* 121* 140*   D-Dimer No results for input(s): DDIMER in the last 72 hours. Hgb A1c No results for input(s): HGBA1C in the last 72 hours. Lipid Profile No results for input(s): CHOL, HDL, LDLCALC, TRIG, CHOLHDL, LDLDIRECT in the last 72 hours. Thyroid function studies No results for input(s): TSH, T4TOTAL, T3FREE, THYROIDAB in the last 72 hours.  Invalid input(s): FREET3 Anemia work up No results for input(s): VITAMINB12, FOLATE, FERRITIN, TIBC, IRON, RETICCTPCT in the last 72 hours. Urinalysis    Component Value Date/Time   COLORURINE YELLOW 11/20/2018 1029   APPEARANCEUR HAZY (A) 11/20/2018 1029   LABSPEC 1.013 11/20/2018 1029   PHURINE 6.0 11/20/2018 1029   GLUCOSEU NEGATIVE 11/20/2018 1029   HGBUR SMALL (A) 11/20/2018 1029   BILIRUBINUR NEGATIVE 11/20/2018 1029   KETONESUR NEGATIVE 11/20/2018 1029   PROTEINUR 100 (A) 11/20/2018 1029   UROBILINOGEN 0.2 04/13/2014 1030   NITRITE NEGATIVE 11/20/2018 1029   LEUKOCYTESUR SMALL (A) 11/20/2018 1029   Sepsis Labs Invalid input(s): PROCALCITONIN,  WBC,  LACTICIDVEN Microbiology Recent Results (from the past 240 hour(s))  MRSA PCR Screening     Status: None   Collection Time: 12/02/18  6:33 PM   Specimen: Nasopharyngeal  Result Value Ref Range Status   MRSA by PCR NEGATIVE NEGATIVE Final    Comment:        The GeneXpert MRSA Assay (FDA approved for NASAL specimens only), is one component of a comprehensive MRSA colonization surveillance program. It is not intended to diagnose  MRSA infection nor to guide or monitor treatment for MRSA infections. Performed at [facility], [place] [address] [place], [place] [address]   Novel Coronavirus, NAA (hospital order; send-out to ref lab)     Status: None   Collection Time: 12/03/18  1:53 PM   Specimen: Nasopharyngeal Swab; Respiratory  Result Value Ref Range Status   SARS-CoV-2, NAA NOT DETECTED NOT DETECTED Final    Comment: (NOTE) This nucleic acid amplification test was developed and its performance characteristics determined by [employer]. Nucleic acid amplification tests include PCR and TMA. This test has not been FDA cleared or approved. This test has been authorized by FDA under an Emergency Use Authorization (EUA). This test is only authorized for the duration of time the declaration that circumstances exist justifying the authorization of the emergency use of in vitro diagnostic tests for detection of SARS-CoV-2 virus and/or diagnosis of COVID-19 infection under section 564(b)(1) of the Act, 21 U.S.C. 768GSU-1(J) (1), unless the authorization is terminated or revoked sooner. When diagnostic testing is negative, the possibility of a false negative result should be considered in the context of a patient's recent exposures and the presence of clinical signs and symptoms consistent with COVID-19. An individual without symptoms of COVID- 19 and who is not shedding SARS-CoV-2 vi rus would expect to have a negative (not detected) result in this assay. Performed At: [facility] [address] [place], [place] [id number] Rush Farmer MD FY:[id number]    [place]  Final    Comment: Performed at [facility], [place] [address], [place], [place] [address]     Time coordinating discharge: 35 minutes  SIGNED:   Jonnie Finner, DO  [facility] 12/06/2018, 11:36 AM Pager   If 7PM-7AM, please contact night-coverage [email or web address] Password [email or web address]

## 2018-12-06 NOTE — Plan of Care (Signed)
Continue to monitor

## 2018-12-06 NOTE — Progress Notes (Signed)
Patient to be transported to [place] center by EMS, IV and tele were dcd. Alejandra Hunt, Bettina Gavia RN

## 2018-12-06 NOTE — Telephone Encounter (Signed)
-----   Message from Eugenia Mcalpine, LPN sent at 79/06/3274  4:55 PM EDT ----- Regarding: FW: followup for endocarditiis pt  ----- Message ----- From: Tommy Medal, Lavell Islam, MD Sent: 12/01/2018   4:37 PM EDT To: [facility] Triage Nurse Pool Subject: followup for endocarditiis pt                  Can we set Freda Munro up with appt w me in late October (or can be with Marya Amsler if he has spot open in the interim. We are trying to make sure that ALL of our endocarditis pts have followups in  clinic

## 2018-12-06 NOTE — Progress Notes (Signed)
Report given to Izora Gala, Therapist, sports at the [facility].

## 2018-12-06 NOTE — Telephone Encounter (Signed)
Have called patient multiple times to try and set up an appointment with Dr.  Tommy Medal for the end of October but phone just rings, can not leave a voice mail to call back and schedule appointment.

## 2018-12-06 NOTE — Progress Notes (Signed)
Attempted to call report. Secretary at [facility] took this RN's name and number and will have nurse call for report when ready.  Emelda Fear, RN

## 2018-12-07 ENCOUNTER — Encounter (HOSPITAL_COMMUNITY): Payer: Self-pay | Admitting: Nephrology

## 2018-12-20 ENCOUNTER — Ambulatory Visit: Payer: Medicaid Other | Admitting: Gastroenterology

## 2018-12-21 NOTE — Telephone Encounter (Signed)
That the best we can do thanks so much

## 2019-01-23 ENCOUNTER — Encounter (HOSPITAL_COMMUNITY): Payer: Self-pay

## 2019-01-23 ENCOUNTER — Inpatient Hospital Stay (HOSPITAL_COMMUNITY)
Admission: AD | Admit: 2019-01-23 | Discharge: 2019-03-03 | DRG: 314 | Disposition: E | Payer: Medicaid Other | Source: Other Acute Inpatient Hospital | Attending: Internal Medicine | Admitting: Internal Medicine

## 2019-01-23 ENCOUNTER — Inpatient Hospital Stay (HOSPITAL_COMMUNITY): Payer: Medicaid Other

## 2019-01-23 ENCOUNTER — Other Ambulatory Visit: Payer: Self-pay

## 2019-01-23 DIAGNOSIS — E785 Hyperlipidemia, unspecified: Secondary | ICD-10-CM | POA: Diagnosis present

## 2019-01-23 DIAGNOSIS — J9622 Acute and chronic respiratory failure with hypercapnia: Secondary | ICD-10-CM | POA: Diagnosis not present

## 2019-01-23 DIAGNOSIS — N179 Acute kidney failure, unspecified: Secondary | ICD-10-CM | POA: Diagnosis present

## 2019-01-23 DIAGNOSIS — Y712 Prosthetic and other implants, materials and accessory cardiovascular devices associated with adverse incidents: Secondary | ICD-10-CM | POA: Diagnosis present

## 2019-01-23 DIAGNOSIS — I38 Endocarditis, valve unspecified: Secondary | ICD-10-CM | POA: Diagnosis not present

## 2019-01-23 DIAGNOSIS — A4181 Sepsis due to Enterococcus: Secondary | ICD-10-CM | POA: Diagnosis present

## 2019-01-23 DIAGNOSIS — G9340 Encephalopathy, unspecified: Secondary | ICD-10-CM | POA: Diagnosis present

## 2019-01-23 DIAGNOSIS — I350 Nonrheumatic aortic (valve) stenosis: Secondary | ICD-10-CM

## 2019-01-23 DIAGNOSIS — I340 Nonrheumatic mitral (valve) insufficiency: Secondary | ICD-10-CM

## 2019-01-23 DIAGNOSIS — D631 Anemia in chronic kidney disease: Secondary | ICD-10-CM | POA: Diagnosis present

## 2019-01-23 DIAGNOSIS — Z961 Presence of intraocular lens: Secondary | ICD-10-CM | POA: Diagnosis present

## 2019-01-23 DIAGNOSIS — Z66 Do not resuscitate: Secondary | ICD-10-CM | POA: Diagnosis present

## 2019-01-23 DIAGNOSIS — N390 Urinary tract infection, site not specified: Secondary | ICD-10-CM | POA: Diagnosis present

## 2019-01-23 DIAGNOSIS — N1832 Chronic kidney disease, stage 3b: Secondary | ICD-10-CM | POA: Diagnosis present

## 2019-01-23 DIAGNOSIS — J690 Pneumonitis due to inhalation of food and vomit: Secondary | ICD-10-CM | POA: Diagnosis not present

## 2019-01-23 DIAGNOSIS — E440 Moderate protein-calorie malnutrition: Secondary | ICD-10-CM | POA: Diagnosis present

## 2019-01-23 DIAGNOSIS — R7881 Bacteremia: Secondary | ICD-10-CM | POA: Diagnosis present

## 2019-01-23 DIAGNOSIS — I5033 Acute on chronic diastolic (congestive) heart failure: Secondary | ICD-10-CM | POA: Diagnosis not present

## 2019-01-23 DIAGNOSIS — R0602 Shortness of breath: Secondary | ICD-10-CM

## 2019-01-23 DIAGNOSIS — Z79890 Hormone replacement therapy (postmenopausal): Secondary | ICD-10-CM

## 2019-01-23 DIAGNOSIS — Z9842 Cataract extraction status, left eye: Secondary | ICD-10-CM

## 2019-01-23 DIAGNOSIS — R1110 Vomiting, unspecified: Secondary | ICD-10-CM

## 2019-01-23 DIAGNOSIS — Z8701 Personal history of pneumonia (recurrent): Secondary | ICD-10-CM

## 2019-01-23 DIAGNOSIS — Z86711 Personal history of pulmonary embolism: Secondary | ICD-10-CM

## 2019-01-23 DIAGNOSIS — Z978 Presence of other specified devices: Secondary | ICD-10-CM

## 2019-01-23 DIAGNOSIS — E870 Hyperosmolality and hypernatremia: Secondary | ICD-10-CM | POA: Diagnosis not present

## 2019-01-23 DIAGNOSIS — Z20828 Contact with and (suspected) exposure to other viral communicable diseases: Secondary | ICD-10-CM | POA: Diagnosis present

## 2019-01-23 DIAGNOSIS — Z9841 Cataract extraction status, right eye: Secondary | ICD-10-CM | POA: Diagnosis not present

## 2019-01-23 DIAGNOSIS — R0989 Other specified symptoms and signs involving the circulatory and respiratory systems: Secondary | ICD-10-CM

## 2019-01-23 DIAGNOSIS — R112 Nausea with vomiting, unspecified: Secondary | ICD-10-CM | POA: Diagnosis not present

## 2019-01-23 DIAGNOSIS — I4891 Unspecified atrial fibrillation: Secondary | ICD-10-CM | POA: Diagnosis present

## 2019-01-23 DIAGNOSIS — Z1612 Extended spectrum beta lactamase (ESBL) resistance: Secondary | ICD-10-CM | POA: Diagnosis present

## 2019-01-23 DIAGNOSIS — B952 Enterococcus as the cause of diseases classified elsewhere: Secondary | ICD-10-CM | POA: Diagnosis present

## 2019-01-23 DIAGNOSIS — T826XXA Infection and inflammatory reaction due to cardiac valve prosthesis, initial encounter: Principal | ICD-10-CM | POA: Diagnosis present

## 2019-01-23 DIAGNOSIS — I313 Pericardial effusion (noninflammatory): Secondary | ICD-10-CM | POA: Diagnosis present

## 2019-01-23 DIAGNOSIS — I2699 Other pulmonary embolism without acute cor pulmonale: Secondary | ICD-10-CM | POA: Diagnosis present

## 2019-01-23 DIAGNOSIS — I058 Other rheumatic mitral valve diseases: Secondary | ICD-10-CM | POA: Diagnosis not present

## 2019-01-23 DIAGNOSIS — K219 Gastro-esophageal reflux disease without esophagitis: Secondary | ICD-10-CM | POA: Diagnosis present

## 2019-01-23 DIAGNOSIS — R140 Abdominal distension (gaseous): Secondary | ICD-10-CM

## 2019-01-23 DIAGNOSIS — J9621 Acute and chronic respiratory failure with hypoxia: Secondary | ICD-10-CM | POA: Diagnosis not present

## 2019-01-23 DIAGNOSIS — K3184 Gastroparesis: Secondary | ICD-10-CM | POA: Diagnosis present

## 2019-01-23 DIAGNOSIS — I358 Other nonrheumatic aortic valve disorders: Secondary | ICD-10-CM | POA: Diagnosis not present

## 2019-01-23 DIAGNOSIS — F419 Anxiety disorder, unspecified: Secondary | ICD-10-CM | POA: Diagnosis present

## 2019-01-23 DIAGNOSIS — F329 Major depressive disorder, single episode, unspecified: Secondary | ICD-10-CM | POA: Diagnosis present

## 2019-01-23 DIAGNOSIS — E039 Hypothyroidism, unspecified: Secondary | ICD-10-CM | POA: Diagnosis present

## 2019-01-23 DIAGNOSIS — Z952 Presence of prosthetic heart valve: Secondary | ICD-10-CM | POA: Diagnosis not present

## 2019-01-23 DIAGNOSIS — Z515 Encounter for palliative care: Secondary | ICD-10-CM | POA: Diagnosis present

## 2019-01-23 DIAGNOSIS — R1310 Dysphagia, unspecified: Secondary | ICD-10-CM | POA: Diagnosis not present

## 2019-01-23 DIAGNOSIS — Z6829 Body mass index (BMI) 29.0-29.9, adult: Secondary | ICD-10-CM

## 2019-01-23 DIAGNOSIS — Z95828 Presence of other vascular implants and grafts: Secondary | ICD-10-CM | POA: Diagnosis not present

## 2019-01-23 DIAGNOSIS — J9601 Acute respiratory failure with hypoxia: Secondary | ICD-10-CM | POA: Diagnosis not present

## 2019-01-23 DIAGNOSIS — R6250 Unspecified lack of expected normal physiological development in childhood: Secondary | ICD-10-CM | POA: Diagnosis present

## 2019-01-23 DIAGNOSIS — R8271 Bacteriuria: Secondary | ICD-10-CM

## 2019-01-23 DIAGNOSIS — I361 Nonrheumatic tricuspid (valve) insufficiency: Secondary | ICD-10-CM

## 2019-01-23 DIAGNOSIS — Z8249 Family history of ischemic heart disease and other diseases of the circulatory system: Secondary | ICD-10-CM

## 2019-01-23 DIAGNOSIS — Z79899 Other long term (current) drug therapy: Secondary | ICD-10-CM

## 2019-01-23 DIAGNOSIS — I469 Cardiac arrest, cause unspecified: Secondary | ICD-10-CM | POA: Diagnosis not present

## 2019-01-23 DIAGNOSIS — D72829 Elevated white blood cell count, unspecified: Secondary | ICD-10-CM | POA: Diagnosis not present

## 2019-01-23 DIAGNOSIS — I330 Acute and subacute infective endocarditis: Secondary | ICD-10-CM

## 2019-01-23 DIAGNOSIS — R627 Adult failure to thrive: Secondary | ICD-10-CM | POA: Diagnosis present

## 2019-01-23 DIAGNOSIS — I509 Heart failure, unspecified: Secondary | ICD-10-CM

## 2019-01-23 DIAGNOSIS — D6959 Other secondary thrombocytopenia: Secondary | ICD-10-CM | POA: Diagnosis present

## 2019-01-23 DIAGNOSIS — I088 Other rheumatic multiple valve diseases: Secondary | ICD-10-CM | POA: Diagnosis present

## 2019-01-23 DIAGNOSIS — R0902 Hypoxemia: Secondary | ICD-10-CM

## 2019-01-23 DIAGNOSIS — Z931 Gastrostomy status: Secondary | ICD-10-CM

## 2019-01-23 DIAGNOSIS — Z833 Family history of diabetes mellitus: Secondary | ICD-10-CM

## 2019-01-23 LAB — CBC WITH DIFFERENTIAL/PLATELET
Abs Immature Granulocytes: 0.96 10*3/uL — ABNORMAL HIGH (ref 0.00–0.07)
Basophils Absolute: 0.1 10*3/uL (ref 0.0–0.1)
Basophils Relative: 0 %
Eosinophils Absolute: 1.2 10*3/uL — ABNORMAL HIGH (ref 0.0–0.5)
Eosinophils Relative: 7 %
HCT: 22.9 % — ABNORMAL LOW (ref 36.0–46.0)
Hemoglobin: 7.1 g/dL — ABNORMAL LOW (ref 12.0–15.0)
Immature Granulocytes: 6 %
Lymphocytes Relative: 4 %
Lymphs Abs: 0.8 10*3/uL (ref 0.7–4.0)
MCH: 27.7 pg (ref 26.0–34.0)
MCHC: 31 g/dL (ref 30.0–36.0)
MCV: 89.5 fL (ref 80.0–100.0)
Monocytes Absolute: 0.7 10*3/uL (ref 0.1–1.0)
Monocytes Relative: 4 %
Neutro Abs: 13.9 10*3/uL — ABNORMAL HIGH (ref 1.7–7.7)
Neutrophils Relative %: 79 %
Platelets: 148 10*3/uL — ABNORMAL LOW (ref 150–400)
RBC: 2.56 MIL/uL — ABNORMAL LOW (ref 3.87–5.11)
RDW: 16.5 % — ABNORMAL HIGH (ref 11.5–15.5)
WBC: 17.5 10*3/uL — ABNORMAL HIGH (ref 4.0–10.5)
nRBC: 0.4 % — ABNORMAL HIGH (ref 0.0–0.2)

## 2019-01-23 LAB — GLUCOSE, CAPILLARY
Glucose-Capillary: 109 mg/dL — ABNORMAL HIGH (ref 70–99)
Glucose-Capillary: 94 mg/dL (ref 70–99)
Glucose-Capillary: 111 mg/dL — ABNORMAL HIGH (ref 70–99)
Glucose-Capillary: 91 mg/dL (ref 70–99)

## 2019-01-23 LAB — COMPREHENSIVE METABOLIC PANEL
ALT: 24 U/L (ref 0–44)
AST: 38 U/L (ref 15–41)
Albumin: 2 g/dL — ABNORMAL LOW (ref 3.5–5.0)
Alkaline Phosphatase: 166 U/L — ABNORMAL HIGH (ref 38–126)
Anion gap: 9 (ref 5–15)
BUN: 36 mg/dL — ABNORMAL HIGH (ref 6–20)
CO2: 24 mmol/L (ref 22–32)
Calcium: 8.7 mg/dL — ABNORMAL LOW (ref 8.9–10.3)
Chloride: 105 mmol/L (ref 98–111)
Creatinine, Ser: 2.01 mg/dL — ABNORMAL HIGH (ref 0.44–1.00)
GFR calc Af Amer: 31 mL/min — ABNORMAL LOW (ref >60)
GFR calc non Af Amer: 27 mL/min — ABNORMAL LOW (ref >60)
Glucose, Bld: 114 mg/dL — ABNORMAL HIGH (ref 70–99)
Potassium: 3.8 mmol/L (ref 3.5–5.1)
Sodium: 138 mmol/L (ref 135–145)
Total Bilirubin: 0.7 mg/dL (ref 0.3–1.2)
Total Protein: 5 g/dL — ABNORMAL LOW (ref 6.5–8.1)

## 2019-01-23 LAB — SARS CORONAVIRUS 2 (TAT 6-24 HRS): SARS Coronavirus 2: NEGATIVE

## 2019-01-23 LAB — IRON AND TIBC
Iron: 35 ug/dL (ref 28–170)
Saturation Ratios: 14 % (ref 10.4–31.8)
TIBC: 245 ug/dL — ABNORMAL LOW (ref 250–450)
UIBC: 210 ug/dL

## 2019-01-23 LAB — RETICULOCYTES
Immature Retic Fract: 28.3 % — ABNORMAL HIGH (ref 2.3–15.9)
RBC.: 2.56 MIL/uL — ABNORMAL LOW (ref 3.87–5.11)
Retic Count, Absolute: 124.4 10*3/uL (ref 19.0–186.0)
Retic Ct Pct: 4.9 % — ABNORMAL HIGH (ref 0.4–3.1)

## 2019-01-23 LAB — VANCOMYCIN, RANDOM: Vancomycin Rm: 27

## 2019-01-23 LAB — MRSA PCR SCREENING: MRSA by PCR: NEGATIVE

## 2019-01-23 LAB — ECHOCARDIOGRAM COMPLETE: Weight: 2278.67 oz

## 2019-01-23 LAB — HEMOGLOBIN AND HEMATOCRIT, BLOOD
HCT: 28.6 % — ABNORMAL LOW (ref 36.0–46.0)
Hemoglobin: 8.7 g/dL — ABNORMAL LOW (ref 12.0–15.0)

## 2019-01-23 LAB — VITAMIN B12: Vitamin B-12: 442 pg/mL (ref 180–914)

## 2019-01-23 LAB — FOLATE: Folate: 9.6 ng/mL (ref >5.9)

## 2019-01-23 LAB — FERRITIN: Ferritin: 426 ng/mL — ABNORMAL HIGH (ref 11–307)

## 2019-01-23 LAB — TSH: TSH: 0.212 u[IU]/mL — ABNORMAL LOW (ref 0.350–4.500)

## 2019-01-23 LAB — SEDIMENTATION RATE: Sed Rate: 98 mm/hr — ABNORMAL HIGH (ref 0–22)

## 2019-01-23 MED ORDER — FLUOXETINE HCL 10 MG PO CAPS
10.0000 mg | ORAL_CAPSULE | Freq: Every day | ORAL | Status: DC
  Administered 2019-01-24 – 2019-02-03 (×11): 10 mg
  Filled 2019-01-23 (×13): qty 1

## 2019-01-23 MED ORDER — ACETAMINOPHEN 650 MG RE SUPP
650.0000 mg | Freq: Four times a day (QID) | RECTAL | Status: DC | PRN
  Administered 2019-01-28: 650 mg via RECTAL
  Filled 2019-01-23: qty 1

## 2019-01-23 MED ORDER — FREE WATER
200.0000 mL | Freq: Four times a day (QID) | Status: DC
  Administered 2019-01-23 – 2019-01-29 (×25): 200 mL

## 2019-01-23 MED ORDER — ROSUVASTATIN CALCIUM 5 MG PO TABS
5.0000 mg | ORAL_TABLET | Freq: Every day | ORAL | Status: DC
  Administered 2019-01-23 – 2019-02-04 (×12): 5 mg
  Filled 2019-01-23 (×13): qty 1

## 2019-01-23 MED ORDER — SODIUM CHLORIDE 0.9 % IV SOLN
1.0000 g | Freq: Two times a day (BID) | INTRAVENOUS | Status: DC
  Administered 2019-01-23: 1 g via INTRAVENOUS
  Filled 2019-01-23 (×2): qty 1

## 2019-01-23 MED ORDER — CHLORHEXIDINE GLUCONATE CLOTH 2 % EX PADS
6.0000 | MEDICATED_PAD | Freq: Every day | CUTANEOUS | Status: DC
  Administered 2019-01-23 – 2019-02-04 (×10): 6 via TOPICAL

## 2019-01-23 MED ORDER — FUROSEMIDE 10 MG/ML IJ SOLN
20.0000 mg | Freq: Once | INTRAMUSCULAR | Status: AC
  Administered 2019-01-23: 20 mg via INTRAVENOUS
  Filled 2019-01-23: qty 2

## 2019-01-23 MED ORDER — SODIUM CHLORIDE 0.9 % IV SOLN
2.0000 g | Freq: Two times a day (BID) | INTRAVENOUS | Status: DC
  Administered 2019-01-23 – 2019-02-04 (×25): 2 g via INTRAVENOUS
  Filled 2019-01-23 (×11): qty 2
  Filled 2019-01-23: qty 20
  Filled 2019-01-23 (×14): qty 2

## 2019-01-23 MED ORDER — APIXABAN 5 MG PO TABS
5.0000 mg | ORAL_TABLET | Freq: Two times a day (BID) | ORAL | Status: DC
  Administered 2019-01-23 – 2019-02-03 (×21): 5 mg via ORAL
  Filled 2019-01-23 (×22): qty 1

## 2019-01-23 MED ORDER — POLYETHYLENE GLYCOL 3350 17 G PO PACK
17.0000 g | PACK | ORAL | Status: DC
  Administered 2019-01-23: 17 g
  Filled 2019-01-23 (×2): qty 1

## 2019-01-23 MED ORDER — ACETAMINOPHEN 325 MG PO TABS
650.0000 mg | ORAL_TABLET | Freq: Once | ORAL | Status: AC
  Administered 2019-01-23: 650 mg via ORAL
  Filled 2019-01-23: qty 2

## 2019-01-23 MED ORDER — PANTOPRAZOLE SODIUM 40 MG PO PACK
40.0000 mg | PACK | Freq: Every day | ORAL | Status: DC
  Administered 2019-01-23 – 2019-02-04 (×12): 40 mg
  Filled 2019-01-23 (×13): qty 20

## 2019-01-23 MED ORDER — TAMSULOSIN HCL 0.4 MG PO CAPS
0.4000 mg | ORAL_CAPSULE | Freq: Every day | ORAL | Status: DC
  Administered 2019-01-23 – 2019-02-04 (×12): 0.4 mg via ORAL
  Filled 2019-01-23 (×13): qty 1

## 2019-01-23 MED ORDER — ONDANSETRON HCL 4 MG/2ML IJ SOLN
4.0000 mg | Freq: Four times a day (QID) | INTRAMUSCULAR | Status: DC | PRN
  Administered 2019-01-27 – 2019-02-02 (×5): 4 mg via INTRAVENOUS
  Filled 2019-01-23 (×6): qty 2

## 2019-01-23 MED ORDER — METOPROLOL TARTRATE 25 MG/10 ML ORAL SUSPENSION
25.0000 mg | Freq: Two times a day (BID) | ORAL | Status: DC
  Administered 2019-01-23 – 2019-02-01 (×16): 25 mg
  Filled 2019-01-23 (×23): qty 10

## 2019-01-23 MED ORDER — DIPHENHYDRAMINE HCL 25 MG PO CAPS
25.0000 mg | ORAL_CAPSULE | Freq: Once | ORAL | Status: AC
  Administered 2019-01-23: 25 mg via ORAL
  Filled 2019-01-23: qty 1

## 2019-01-23 MED ORDER — ORAL CARE MOUTH RINSE
15.0000 mL | Freq: Two times a day (BID) | OROMUCOSAL | Status: DC
  Administered 2019-01-23 – 2019-02-04 (×23): 15 mL via OROMUCOSAL

## 2019-01-23 MED ORDER — FREE WATER
200.0000 mL | Freq: Three times a day (TID) | Status: DC
  Administered 2019-01-23: 200 mL

## 2019-01-23 MED ORDER — ONDANSETRON HCL 4 MG PO TABS
4.0000 mg | ORAL_TABLET | Freq: Four times a day (QID) | ORAL | Status: DC | PRN
  Administered 2019-01-28: 4 mg via ORAL
  Filled 2019-01-23: qty 1

## 2019-01-23 MED ORDER — LEVOTHYROXINE SODIUM 75 MCG PO TABS
150.0000 ug | ORAL_TABLET | Freq: Every day | ORAL | Status: DC
  Administered 2019-01-23 – 2019-02-04 (×11): 150 ug
  Filled 2019-01-23 (×12): qty 2

## 2019-01-23 MED ORDER — ACETAMINOPHEN 325 MG PO TABS
650.0000 mg | ORAL_TABLET | Freq: Four times a day (QID) | ORAL | Status: DC | PRN
  Administered 2019-01-27 – 2019-01-31 (×6): 650 mg via ORAL
  Filled 2019-01-23 (×6): qty 2

## 2019-01-23 MED ORDER — METOCLOPRAMIDE HCL 5 MG/5ML PO SOLN
10.0000 mg | Freq: Three times a day (TID) | ORAL | Status: DC
  Administered 2019-01-23 – 2019-02-04 (×41): 10 mg
  Filled 2019-01-23 (×52): qty 10

## 2019-01-23 MED ORDER — FOSFOMYCIN TROMETHAMINE 3 G PO PACK
3.0000 g | PACK | Freq: Once | ORAL | Status: AC
  Administered 2019-01-23: 3 g via ORAL
  Filled 2019-01-23 (×2): qty 3

## 2019-01-23 MED ORDER — SODIUM CHLORIDE 0.9 % IV SOLN
2.0000 g | Freq: Three times a day (TID) | INTRAVENOUS | Status: DC
  Administered 2019-01-23 – 2019-01-25 (×6): 2 g via INTRAVENOUS
  Filled 2019-01-23 (×2): qty 2
  Filled 2019-01-23: qty 2000
  Filled 2019-01-23: qty 2
  Filled 2019-01-23: qty 2000
  Filled 2019-01-23 (×3): qty 2

## 2019-01-23 MED ORDER — SODIUM CHLORIDE 0.9% IV SOLUTION
Freq: Once | INTRAVENOUS | Status: AC
  Administered 2019-01-23: 12:00:00 via INTRAVENOUS

## 2019-01-23 NOTE — Progress Notes (Signed)
Pharmacy Antibiotic Note  Audrey Collier is a 58 y.o. female admitted on 01/21/2019 with ESBL infection.  Pharmacy has been consulted for Vancomycin/Merrem dosing. Pt is a transfer from [facility]. WBC is elevated. Noted renal dysfunction.   Anti-biotics at [facility] dose 11/22 at 0556 Merrem-last dose 11/12 at 1559  Plan: Will check a random vancomycin level now, dose based on those results Merrem 1000 mg IV q12h Trend WBC, temp, renal function  F/U infectious work-up Drug levels as indicated   Weight: 142 lb 6.7 oz (64.6 kg)  Temp (24hrs), Avg:98.5 F (36.9 C), Min:98.2 F (36.8 C), Max:98.8 F (37.1 C)  Recent Labs  Lab 01/15/2019 0324  WBC 17.5*  CREATININE 2.01*    Estimated Creatinine Clearance: 27.3 mL/min (A) (by C-G formula based on SCr of 2.01 mg/dL (H)).    No Known Allergies  Narda Bonds, PharmD, BCPS Clinical Pharmacist Phone: [phone]

## 2019-01-23 NOTE — Consult Note (Addendum)
Cardiology Consultation:   Patient ID: Audrey SevereSheila D Mccorvey MRN: [id number]; DOB: [birth year]  Admit date: 01/12/2019 Date of Consult: 01/03/2019  Primary Care Provider: Pearson GrippeKim, James, MD Primary Cardiologist: Armanda Magicraci Turner, MD  Primary Electrophysiologist:  None    Patient Profile:   Audrey Collier is a 58 y.o. female with a hx of developmental delay, hypothyroidism, endocarditis s/p aortic valve replacement August 2020 with complicated admission of 2 months, and two recent admissions in October for PNA and Heart Failure who is being seen today for the evaluation of endocarditis at the request of Dr. Mahala MenghiniSamtani.  History of Present Illness:   History obtained through chart review.   Patient was admitted 10/01/18 - 12/06/18 for fever and AMS found to have vegetation on valve and underwent AVR with inspiris 10/07/18. Echo showed EF 55-60%. Patient was briefly intubated during admission. Admission was complicated by AKI, urinary retention, elevated LFTs (now normalized), perc tube placement, anemia down to 6.8 requiring transfusions/thrombocytopenia. Patient was made DNR.   Since then she has been hospitalized twice. The first was early October for PNA and heart failure. She stabilized and went [facility]. She came in again later in October for possible PNA and Heart failure and improved even more. She was noted to have small PE and Eliquis was started.   Audrey Collier presented to the [facility] ED again 01/20/19 for AMS and fever. CXR was abnormal with possible B/L airspace. [name]. COVID negative. Procalcitonin elevated. BNP 2800.  Blood cultures grew enterococcus and was started on Vancomycin. UA positive for ESBL UTI. 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 Hemoglobin dropped down to 7 and received 1 unit PRBC but was discontinued due to flushing/nausea. Creatinine 1.4.. After discussion with the patient's family she was transferred to [facility].   Heart Pathway Score:      Past Medical History:  Diagnosis Date  . Anxiety   . Dizziness   . Endometrial polyp 03/26/2014  . Fecal occult blood test positive 03/15/2013   will send 3 cards home and refer to GI for colonoscopy  . GERD (gastroesophageal reflux disease)   . Hypothyroidism   . Mental disorder    mentally challenged  . PMB (postmenopausal bleeding) 03/26/2014  . Thickened endometrium 03/26/2014   ?polyp will get HSG    Past Surgical History:  Procedure Laterality Date  . AORTIC VALVE REPLACEMENT N/A 10/07/2018   Procedure: AORTIC VALVE REPLACEMENT (AVR);  Surgeon: Corliss SkainsLightfoot, Harrell O, MD;  [facility];  Service: Open Heart Surgery;  Laterality: N/A;  . BIOPSY  11/03/2018   Procedure: BIOPSY;  Surgeon: Tressia DanasBeavers, Kimberly, MD;  [facility];  Service: Gastroenterology;;  . CATARACT EXTRACTION W/PHACO Left 08/06/2017   Procedure: CATARACT EXTRACTION PHACO AND INTRAOCULAR LENS PLACEMENT LEFT EYE;  Surgeon: Fabio PierceWrzosek, James, MD;  Location: [facility] ORS;  Service: Ophthalmology;  Laterality: Left;  CDE: 12.63  . CATARACT EXTRACTION W/PHACO Right 10/01/2017   Procedure: CATARACT EXTRACTION PHACO AND INTRAOCULAR LENS PLACEMENT (IOC);  Surgeon: Fabio PierceWrzosek, James, MD;  Location: [facility] ORS;  Service: Ophthalmology;  Laterality: Right;  CDE: 5.77  . COLONOSCOPY N/A 12/01/2013   Dr. Fields:moderate sized internal hemorrhoids/left colon is redundant  . DILITATION & CURRETTAGE/HYSTROSCOPY WITH NOVASURE ABLATION N/A 04/18/2014   Procedure: DILATATION & CURETTAGE/HYSTEROSCOPY WITH NOVASURE ENDOMETRIAL ABLATION;  Surgeon: Lazaro ArmsLuther H Eure, MD;  Location: [facility] ORS;  Service: Gynecology;  Laterality: N/A;  Uterine Cavity Length 5cm     . ESOPHAGOGASTRODUODENOSCOPY N/A 12/01/2013  WUJ:WJXB non erosive gastritis. +H.pylori   . ESOPHAGOGASTRODUODENOSCOPY (EGD) WITH PROPOFOL N/A 11/03/2018   Procedure: ESOPHAGOGASTRODUODENOSCOPY (EGD) WITH PROPOFOL;  Surgeon: Thornton Park, MD;  Location: [place];  Service: Gastroenterology;   Laterality: N/A;  . IR GASTROSTOMY TUBE MOD SED  11/04/2018  . NO PAST SURGERIES    . POLYPECTOMY N/A 04/18/2014   Procedure: ENDOMETRIAL POLYPECTOMY;  Surgeon: Florian Buff, MD;  Location: [facility];  Service: Gynecology;  Laterality: N/A;  . TEE WITHOUT CARDIOVERSION N/A 10/07/2018   Procedure: TRANSESOPHAGEAL ECHOCARDIOGRAM (TEE);  Surgeon: Lajuana Matte, MD;  Location: [place];  Service: Open Heart Surgery;  Laterality: N/A;     Home Medications:  Prior to Admission medications   Medication Sig Start Date End Date Taking? Authorizing Provider  cetirizine (ZYRTEC) 10 MG tablet Take 10 mg by mouth daily. 12/16/18  Yes [provider]  levothyroxine (SYNTHROID) 125 MCG tablet Take 125 mcg by mouth daily at 6 (six) AM.   Yes [provider]  omeprazole (PRILOSEC) 20 MG capsule Take 20 mg by mouth daily. 12/16/18  Yes [provider]  Amino Acids-Protein Hydrolys (FEEDING SUPPLEMENT, PRO-STAT SUGAR FREE 64,) LIQD Place 30 mLs into feeding tube 3 (three) times daily. 12/06/18   Cherylann Ratel A, DO  diclofenac sodium (VOLTAREN) 1 % GEL Apply 1 application topically every 6 (six) hours as needed (heel pain).    [provider]  FLUoxetine (PROZAC) 10 MG tablet Place 1 tablet (10 mg total) into feeding tube at bedtime. 12/06/18   Cherylann Ratel A, DO  metoCLOPramide (REGLAN) 10 MG tablet Place 1 tablet (10 mg total) into feeding tube 4 (four) times daily -  before meals and at bedtime. 12/06/18   Cherylann Ratel A, DO  metoprolol tartrate (LOPRESSOR) 25 MG tablet Place 1 tablet (25 mg total) into feeding tube 2 (two) times daily. 12/06/18 01/05/19  Cherylann Ratel A, DO  Nutritional Supplements (FEEDING SUPPLEMENT, OSMOLITE 1.5 CAL,) LIQD Place 1,000 mLs into feeding tube continuous. 12/06/18   Cherylann Ratel A, DO  polyethylene glycol (MIRALAX / GLYCOLAX) 17 g packet Place 17 g into feeding tube every Monday, Wednesday, and Friday. 12/07/18   Cherylann Ratel A, DO  promethazine  (PHENERGAN) 25 MG suppository Place 1 suppository (25 mg total) rectally every 6 (six) hours as needed for nausea, vomiting or refractory nausea / vomiting. 12/06/18   Cherylann Ratel A, DO  rosuvastatin (CRESTOR) 5 MG tablet Place 1 tablet (5 mg total) into feeding tube daily. 12/06/18   Cherylann Ratel A, DO  tamsulosin (FLOMAX) 0.4 MG CAPS capsule Take 1 capsule (0.4 mg total) by mouth daily. 12/07/18   Jonnie Finner, DO  Water For Irrigation, Sterile (FREE WATER) SOLN Place 200 mLs into feeding tube every 8 (eight) hours. 12/06/18   Jonnie Finner, DO    Inpatient Medications: Scheduled Meds: . sodium chloride   Intravenous Once  . acetaminophen  650 mg Oral Once  . diphenhydrAMINE  25 mg Oral Once  . FLUoxetine  10 mg Per Tube QHS  . free water  200 mL Per Tube Q6H  . furosemide  20 mg Intravenous Once  . levothyroxine  150 mcg Per Tube Q0600  . metoCLOPramide  10 mg Per Tube TID AC & HS  . metoprolol tartrate  25 mg Per Tube BID  . pantoprazole sodium  40 mg Per Tube Daily  . polyethylene glycol  17 g Per Tube Q M,W,F  . rosuvastatin  5 mg Per Tube  Daily  . tamsulosin  0.4 mg Oral Daily   Continuous Infusions: . meropenem (MERREM) IV 1 g (Jan 25, 2019 0628)   PRN Meds: acetaminophen **OR** acetaminophen, ondansetron **OR** ondansetron (ZOFRAN) IV  Allergies:   No Known Allergies  Social History:   Social History   Socioeconomic History  . Marital status: Single    Spouse name: Not on file  . Number of children: Not on file  . Years of education: Not on file  . Highest education level: Not on file  Occupational History  . Not on file  Social Needs  . Financial resource strain: Not on file  . Food insecurity    Worry: Not on file    Inability: Not on file  . Transportation needs    Medical: Not on file    Non-medical: Not on file  Tobacco Use  . Smoking status: Never Smoker  . Smokeless tobacco: Never Used  . Tobacco comment: Never smoked  Substance and Sexual Activity  .  Alcohol use: No    Alcohol/week: 0.0 standard drinks  . Drug use: No  . Sexual activity: Never  Lifestyle  . Physical activity    Days per week: Not on file    Minutes per session: Not on file  . Stress: Not on file  Relationships  . Social Musician on phone: Not on file    Gets together: Not on file    Attends religious service: Not on file    Active member of club or organization: Not on file    Attends meetings of clubs or organizations: Not on file    Relationship status: Not on file  . Intimate partner violence    Fear of current or ex partner: Not on file    Emotionally abused: Not on file    Physically abused: Not on file    Forced sexual activity: Not on file  Other Topics Concern  . Not on file  Social History Narrative  . Not on file    Family History:   Family History  Problem Relation Age of Onset  . Diabetes Maternal Aunt   . Hypertension Maternal Aunt   . Diabetes Maternal Uncle   . Colon cancer Neg Hx      ROS:  Please see the history of present illness.  All other ROS reviewed and negative.     Physical Exam/Data:   Vitals:   01/01/2019 0133 01/02/2019 0135 January 25, 2019 0440 01/09/2019 0450  BP: 95/60 95/60 100/61   Pulse: 92 91 82 80  Resp:   (!) 49 20  Temp: 98.2 F (36.8 C)  98.8 F (37.1 C)   TempSrc: Oral  Oral   SpO2: 96% 98% 97% 98%  Weight: 64.6 kg       Intake/Output Summary (Last 24 hours) at 01/11/2019 0944 Last data filed at 01/24/2019 0440 Gross per 24 hour  Intake -  Output 300 ml  Net -300 ml   Last 3 Weights 01/03/2019 12/06/2018 12/05/2018  Weight (lbs) 142 lb 6.7 oz 195 lb 5.2 oz 196 lb 3.4 oz  Weight (kg) 64.6 kg 88.6 kg 89 kg     Body mass index is 26.05 kg/m.  General:  Well nourished, well developed, in no acute distress HEENT: normal Lymph: no adenopathy Neck: no JVD Endocrine:  No thryomegaly Vascular: No carotid bruits; FA pulses 2+ bilaterally without bruits  Cardiac:  normal S1, S2; RRR; systolic  murmur  Lungs:  clear to auscultation bilaterally, no  wheezing, rhonchi or rales  Abd: soft, nontender, no hepatomegaly  Ext: no edema Musculoskeletal:  No deformities, BUE and BLE strength normal and equal Skin: warm and dry  Neuro:  CNs 2-12 intact, no focal abnormalities noted Psych:  Flat affect   EKG:  The EKG was personally reviewed and demonstrates:  Pending Telemetry:  Telemetry was personally reviewed and demonstrates:  NSR, HR 80s, no other arrhythmias noted  Relevant CV Studies:  TTE @ [facility] Normal LV size, EF 50-55%, mildly dilated RA, biological AV prosthesis with high gradients (worse since Oct 2020), mild to moderate Regurg/ MV endocarditis, Moderately increased systolic pressure  Intraoperative TEE 10/07/18 Complications: No known complications during this procedure. POST-OP IMPRESSIONS - Left Ventricle: The left ventricle is unchanged from pre-bypass. - Aorta: The aorta appears unchanged from pre-bypass. - Left Atrial Appendage: The left atrial appendage appears unchanged from pre-bypass. - Aortic Valve: No stenosis present. A bioprosthetic bioprosthetic valve was placed, leaflets are not freely mobile. There is no regurgitation. No regurgitation post repair. No perivalvular leak noted. - Mitral Valve: The mitral valve appears unchanged from pre-bypass.  Laboratory Data:  High Sensitivity Troponin:  No results for input(s): TROPONINIHS in the last 720 hours.   Chemistry Recent Labs  Lab 01/07/2019 0324  NA 138  K 3.8  CL 105  CO2 24  GLUCOSE 114*  BUN 36*  CREATININE 2.01*  CALCIUM 8.7*  GFRNONAA 27*  GFRAA 31*  ANIONGAP 9    Recent Labs  Lab 01/06/2019 0324  PROT 5.0*  ALBUMIN 2.0*  AST 38  ALT 24  ALKPHOS 166*  BILITOT 0.7   Hematology Recent Labs  Lab 01/29/2019 0324  WBC 17.5*  RBC 2.56*  2.56*  HGB 7.1*  HCT 22.9*  MCV 89.5  MCH 27.7  MCHC 31.0  RDW 16.5*  PLT 148*   BNPNo results for input(s): BNP, PROBNP in the last 168 hours.   DDimer No results for input(s): DDIMER in the last 168 hours.   Radiology/Studies:  Dg Abd 1 View  Result Date: 01/08/2019 CLINICAL DATA:  Abdominal pain EXAM: ABDOMEN - 1 VIEW COMPARISON:  None. FINDINGS: Gastrostomy catheter is noted in satisfactory position. Scattered large and small bowel gas is noted. No free air is seen. No acute bony abnormality is noted. IMPRESSION: No acute abnormality seen. Electronically Signed   By: Alcide Clever M.D.   On: 01/11/2019 03:06   Dg Chest Port 1 View  Result Date: 01/17/2019 CLINICAL DATA:  Shortness of breath EXAM: PORTABLE CHEST 1 VIEW COMPARISON:  11/30/2018 FINDINGS: Cardiac shadow remains enlarged. Postsurgical changes are seen. Vascular congestion is noted with mild interstitial edema. The degree of patchy airspace opacity has improved somewhat in the interval from the prior exam. Previously seen right pleural effusion has resolved in the interval. IMPRESSION: Increased vascular congestion and interstitial edema. Some improved aeration is noted particularly in the mid left lung and right lung base. Electronically Signed   By: Alcide Clever M.D.   On: 01/05/2019 03:09    Assessment and Plan:   Bacteremia/ESBL UTI/ History of AV replacement/Mitral valve endocarditis Patient [facility] to [facility] with AMS and fever found to have positive blood cultures and UTI started on vanc/meropenem. H/o of Aortic valve replacement in August 2020 with complicated 2 month hospitalization. Recent TTE showed Mitral valve endocarditis and increased AV gradient. Patient is on Eliquis for small PE. Hgb down to 7 - WBC is up but patient has been afebrile - Second attempt at  transfusion today. Follow CBC - ID following - Will repeat transthoracic echo - Patient will likely need TEE but might need to be postponed until Hgb has improved. Md to see  H/o AV replacement/Mitral valve endocarditis - Recent TEE showed increased AV gradient and MV endocarditis.   - Repeat Echo, plan for TEE late this week - continue Lopressor 25 mg BID  AKI - creatinine up from baseline. 2.01. baseline around 1.7 - daily BMET  Hyperlipidemia - continue statin  Acute on Chronic anemia - baseline around 8 - Hgb 7.1. transfusion was previously discontinued for flushing/nausea. Attempt at transfusion today - daily CBC  Peg tube - on soft diet  H/o small PE - Eliquis  For questions or updates, please contact [facility] Please consult [email or web address] for contact info under     Signed, Cadence David Stall, PA-C  25-Jan-2019 9:44 AM   Patient examined chart reviewed discussed care with PA/nurses and patient. Unfortunate female with tissue AVR 10/07/18 for large mobile AV vegetation Reviewed intra-operative TEE and MV was normal with only mild MR. At end of case nice result with no PVL and normal EF. Now transferred back to [facility] with evidence of recurrent bacteremia and SBE. Outside echo report indicates elevated gradients across AVR and vegetation on MV. Exam with loud SEM across AVR no AR and MR murmur at apex. She is comfortable and afebrile with no stigmata of SBE on skin She has a PEG tube but can swallow with soft diet written for. Lungs clear She has significant anemia and ? Reaction to blood transfusion this am. Will start with TTE at our institution to assess AVR and ? New vegetations on mitral valve Tentatively arrange TEE for Wednesday She is not a candidate for redo surgery in my opinion   Charlton Haws

## 2019-01-23 NOTE — H&P (Signed)
History and Physical    Audrey Collier ZDG:[id number] DOB: 1960-03-16 DOA: 01/20/2019  PCP: Jani Gravel, MD  Patient coming from: Patient was transferred from [facility].  History obtained from accepting physician and patient's aunt who is also the healthcare power of attorney.  Chief Complaint: Blood cultures positive.  HPI: Audrey Collier is a 58 y.o. female with history of developmental delay, hypothyroidism who was recently admitted in August 2020 for fever was found to have MSSA bacteremia aortic valve vegetation underwent surgery aortic valve replacement and at the time was also was briefly intubated and since patient had poor oral intake had to be placed on PEG tube feeds eventually did well and started eating and was transferred to rehab.  Patient had completed her antibiotics by time she got discharged during last admission.  Patient became acutely confused and had fever and was brought to the ER at [facility] and was started on empiric antibiotics and blood cultures grew Enterococcus all 4.  Was placed on vancomycin.  UA showed ESBL UTI and meropenem was added.  2D echo done showed atrial valve which station TEE was not done and patient was transferred to [facility].  Patient hemoglobin was around 7 and 1 unit of PRBC was transfused but was discontinued due to patient started developing flushing and nausea.  Patient's creatinine is around 1.4 hemoglobin around 7 and at the time of my exam patient not in distress and is following commands and moving all extremities.  COVID-19 test was negative at the facility.  I reviewed patient's recent labs and old charts and discussed with patient's aunt Ms. Audrey Collier can be reached at [phone] who is a healthcare power of attorney.  Patient used to be a DNR which has been changed to full code.  ED Course: Patient is a direct admit.  Review of Systems: As per HPI, rest all negative.   Past Medical History:  Diagnosis Date  . Anxiety   .  Dizziness   . Endometrial polyp 03/26/2014  . Fecal occult blood test positive 03/15/2013   will send 3 cards home and refer to GI for colonoscopy  . GERD (gastroesophageal reflux disease)   . Hypothyroidism   . Mental disorder    mentally challenged  . PMB (postmenopausal bleeding) 03/26/2014  . Thickened endometrium 03/26/2014   ?polyp will get HSG    Past Surgical History:  Procedure Laterality Date  . AORTIC VALVE REPLACEMENT N/A 10/07/2018   Procedure: AORTIC VALVE REPLACEMENT (AVR);  Surgeon: Lajuana Matte, MD;  Location: [place];  Service: Open Heart Surgery;  Laterality: N/A;  . BIOPSY  11/03/2018   Procedure: BIOPSY;  Surgeon: Thornton Park, MD;  Location: [facility] ENDOSCOPY;  Service: Gastroenterology;;  . CATARACT EXTRACTION W/PHACO Left 08/06/2017   Procedure: CATARACT EXTRACTION PHACO AND INTRAOCULAR LENS PLACEMENT LEFT EYE;  Surgeon: Baruch Goldmann, MD;  Location: [facility];  Service: Ophthalmology;  Laterality: Left;  CDE: 12.63  . CATARACT EXTRACTION W/PHACO Right 10/01/2017   Procedure: CATARACT EXTRACTION PHACO AND INTRAOCULAR LENS PLACEMENT (IOC);  Surgeon: Baruch Goldmann, MD;  Location: [facility];  Service: Ophthalmology;  Laterality: Right;  CDE: 5.77  . COLONOSCOPY N/A 12/01/2013   Dr. Fields:moderate sized internal hemorrhoids/left colon is redundant  . DILITATION & CURRETTAGE/HYSTROSCOPY WITH NOVASURE ABLATION N/A 04/18/2014   Procedure: DILATATION & CURETTAGE/HYSTEROSCOPY WITH NOVASURE ENDOMETRIAL ABLATION;  Surgeon: Florian Buff, MD;  Location: [facility];  Service: Gynecology;  Laterality: N/A;  Uterine Cavity Length 5cm     .  ESOPHAGOGASTRODUODENOSCOPY N/A 12/01/2013   ZOX:WRUE non erosive gastritis. +H.pylori   . ESOPHAGOGASTRODUODENOSCOPY (EGD) WITH PROPOFOL N/A 11/03/2018   Procedure: ESOPHAGOGASTRODUODENOSCOPY (EGD) WITH PROPOFOL;  Surgeon: Tressia Danas, MD;  Location: [facility] ENDOSCOPY;  Service: Gastroenterology;  Laterality: N/A;  . IR GASTROSTOMY TUBE MOD SED  11/04/2018   . NO PAST SURGERIES    . POLYPECTOMY N/A 04/18/2014   Procedure: ENDOMETRIAL POLYPECTOMY;  Surgeon: Lazaro Arms, MD;  Location: [facility];  Service: Gynecology;  Laterality: N/A;  . TEE WITHOUT CARDIOVERSION N/A 10/07/2018   Procedure: TRANSESOPHAGEAL ECHOCARDIOGRAM (TEE);  Surgeon: Corliss Skains, MD;  Location[facility];  Service: Open Heart Surgery;  Laterality: N/A;     reports that she has never smoked. She has never used smokeless tobacco. She reports that she does not drink alcohol or use drugs.  No Known Allergies  Family History  Problem Relation Age of Onset  . Diabetes Maternal Aunt   . Hypertension Maternal Aunt   . Diabetes Maternal Uncle   . Colon cancer Neg Hx     Prior to Admission medications   Medication Sig Start Date End Date Taking? Authorizing Provider  Amino Acids-Protein Hydrolys (FEEDING SUPPLEMENT, PRO-STAT SUGAR FREE 64,) LIQD Place 30 mLs into feeding tube 3 (three) times daily. 12/06/18   Margie Ege A, DO  amLODipine (NORVASC) 10 MG tablet Place 1 tablet (10 mg total) into feeding tube daily. 12/07/18 01/06/19  Margie Ege A, DO  diclofenac sodium (VOLTAREN) 1 % GEL Apply 1 application topically every 6 (six) hours as needed (heel pain).    [provider]  FLUoxetine (PROZAC) 10 MG tablet Place 1 tablet (10 mg total) into feeding tube at bedtime. 12/06/18   Margie Ege A, DO  levothyroxine (SYNTHROID) 150 MCG tablet Place 1 tablet (150 mcg total) into feeding tube daily at 6 (six) AM. 12/07/18   Ronaldo Miyamoto, Tyrone A, DO  metoCLOPramide (REGLAN) 10 MG tablet Place 1 tablet (10 mg total) into feeding tube 4 (four) times daily -  before meals and at bedtime. 12/06/18   Margie Ege A, DO  metoprolol tartrate (LOPRESSOR) 25 MG tablet Place 1 tablet (25 mg total) into feeding tube 2 (two) times daily. 12/06/18 01/05/19  Margie Ege A, DO  Nutritional Supplements (FEEDING SUPPLEMENT, OSMOLITE 1.5 CAL,) LIQD Place 1,000 mLs into feeding tube continuous. 12/06/18    Margie Ege A, DO  pantoprazole sodium (PROTONIX) 40 mg/20 mL PACK Place 20 mLs (40 mg total) into feeding tube daily. 12/06/18   Margie Ege A, DO  polyethylene glycol (MIRALAX / GLYCOLAX) 17 g packet Place 17 g into feeding tube every Monday, Wednesday, and Friday. 12/07/18   Margie Ege A, DO  promethazine (PHENERGAN) 25 MG suppository Place 1 suppository (25 mg total) rectally every 6 (six) hours as needed for nausea, vomiting or refractory nausea / vomiting. 12/06/18   Margie Ege A, DO  rosuvastatin (CRESTOR) 5 MG tablet Place 1 tablet (5 mg total) into feeding tube daily. 12/06/18   Margie Ege A, DO  tamsulosin (FLOMAX) 0.4 MG CAPS capsule Take 1 capsule (0.4 mg total) by mouth daily. 12/07/18   Teddy Spike, DO  Water For Irrigation, Sterile (FREE WATER) SOLN Place 200 mLs into feeding tube every 8 (eight) hours. 12/06/18   Teddy Spike, DO    Physical Exam: Constitutional: Moderately built and nourished. Vitals:   Feb 20, 2019 0133 02/20/2019 0135  BP: 95/60 95/60  Pulse: 92 91  Temp: 98.2 F (36.8 C)   TempSrc:  Oral   SpO2: 96% 98%  Weight: 64.6 kg    Eyes: Anicteric no pallor. ENMT: No discharge from the ears eyes nose or mouth. Neck: No mass felt.  No neck rigidity. Respiratory: No rhonchi or crepitations. Cardiovascular: S1-S2 heard. Abdomen: Soft nontender PEG tube seen. Musculoskeletal: No edema. Skin: No rash. Neurologic: Alert awake oriented to name follows commands moves all extremities. Psychiatric: Oriented to her name.  Has mental delay.   Labs on Admission: I have personally reviewed following labs and imaging studies  CBC: No results for input(s): WBC, NEUTROABS, HGB, HCT, MCV, PLT in the last 168 hours. Basic Metabolic Panel: No results for input(s): NA, K, CL, CO2, GLUCOSE, BUN, CREATININE, CALCIUM, MG, PHOS in the last 168 hours. GFR: CrCl cannot be calculated (Patient's most recent lab result is older than the maximum 21 days allowed.). Liver Function  Tests: No results for input(s): AST, ALT, ALKPHOS, BILITOT, PROT, ALBUMIN in the last 168 hours. No results for input(s): LIPASE, AMYLASE in the last 168 hours. No results for input(s): AMMONIA in the last 168 hours. Coagulation Profile: No results for input(s): INR, PROTIME in the last 168 hours. Cardiac Enzymes: No results for input(s): CKTOTAL, CKMB, CKMBINDEX, TROPONINI in the last 168 hours. BNP (last 3 results) No results for input(s): PROBNP in the last 8760 hours. HbA1C: No results for input(s): HGBA1C in the last 72 hours. CBG: No results for input(s): GLUCAP in the last 168 hours. Lipid Profile: No results for input(s): CHOL, HDL, LDLCALC, TRIG, CHOLHDL, LDLDIRECT in the last 72 hours. Thyroid Function Tests: No results for input(s): TSH, T4TOTAL, FREET4, T3FREE, THYROIDAB in the last 72 hours. Anemia Panel: No results for input(s): VITAMINB12, FOLATE, FERRITIN, TIBC, IRON, RETICCTPCT in the last 72 hours. Urine analysis:    Component Value Date/Time   COLORURINE YELLOW 11/20/2018 1029   APPEARANCEUR HAZY (A) 11/20/2018 1029   LABSPEC 1.013 11/20/2018 1029   PHURINE 6.0 11/20/2018 1029   GLUCOSEU NEGATIVE 11/20/2018 1029   HGBUR SMALL (A) 11/20/2018 1029   BILIRUBINUR NEGATIVE 11/20/2018 1029   KETONESUR NEGATIVE 11/20/2018 1029   PROTEINUR 100 (A) 11/20/2018 1029   UROBILINOGEN 0.2 04/13/2014 1030   NITRITE NEGATIVE 11/20/2018 1029   LEUKOCYTESUR SMALL (A) 11/20/2018 1029   Sepsis Labs: @LABRCNTIP (procalcitonin:4,lacticidven:4) )No results found for this or any previous visit (from the past 240 hour(s)).   Radiological Exams on Admission: No results found.   Assessment/Plan Principal Problem:   Bacteremia Active Problems:   S/P AVR (aortic valve replacement)   Nausea & vomiting    1. Enterococcus bacteremia with ESBL UTI with recent aortic valve replacement for endocarditis has mitral valve endocarditis at this time we will continue patient on vancomycin  and meropenem.  Will consult infectious disease and cardiology in the morning likely will need TEE.  Repeat blood culture have been ordered. 2. Acute renal failure creatinine is baseline around 1.5.  Closely follow intake output metabolic panel. 3. Hypothyroidism on Synthroid. 4. Hyperlipidemia on statins. 5. Chronic anemia likely from anemia of chronic disease.  Follow CBC anemia panel.  Patient was ordered 1 unit of PRBC which was stopped because of patient becoming flushed and nauseous. 6. Mental delay. 7. PEG tube is in place.  Per patient's aunt patient is tolerating oral diet this is confirmed with patient's nurse and patient was tolerating diet at the facility.  All labs including COVID-19, metabolic panel CBC blood cultures TSH x-ray of the chest x-ray of the abdomen are all pending.  DVT prophylaxis: Heparin. Code Status: Full code.  This was confirmed with patient's healthcare power of attorney. Family Communication: Ms. Audrey Collier who is a healthcare power of attorney. Disposition Plan: To be determined. Consults called: None. Admission status: Inpatient.   Eduard Clos MD [facility] Pager [phone].  If 7PM-7AM, please contact night-coverage [email or web address] Password [email or web address]  02/14/2019, 2:51 AM

## 2019-01-23 NOTE — Evaluation (Signed)
Clinical/Bedside Swallow Evaluation Patient Details  Name: Audrey Collier MRN: [id number] Date of Birth: Jun 12, 1960  Today's Date: 01/22/2019 Time: SLP Start Time (ACUTE ONLY): 1400 SLP Stop Time (ACUTE ONLY): 1430 SLP Time Calculation (min) (ACUTE ONLY): 30 min  Past Medical History:  Past Medical History:  Diagnosis Date  . Anxiety   . Dizziness   . Endometrial polyp 03/26/2014  . Fecal occult blood test positive 03/15/2013   will send 3 cards home and refer to GI for colonoscopy  . GERD (gastroesophageal reflux disease)   . Hypothyroidism   . Mental disorder    mentally challenged  . PMB (postmenopausal bleeding) 03/26/2014  . Thickened endometrium 03/26/2014   ?polyp will get HSG   Past Surgical History:  Past Surgical History:  Procedure Laterality Date  . AORTIC VALVE REPLACEMENT N/A 10/07/2018   Procedure: AORTIC VALVE REPLACEMENT (AVR);  Surgeon: Lajuana Matte, MD;  Location: [place];  Service: Open Heart Surgery;  Laterality: N/A;  . BIOPSY  11/03/2018   Procedure: BIOPSY;  Surgeon: Thornton Park, MD;  Location: [facility] ENDOSCOPY;  Service: Gastroenterology;;  . CATARACT EXTRACTION W/PHACO Left 08/06/2017   Procedure: CATARACT EXTRACTION PHACO AND INTRAOCULAR LENS PLACEMENT LEFT EYE;  Surgeon: Baruch Goldmann, MD;  Location: [facility];  Service: Ophthalmology;  Laterality: Left;  CDE: 12.63  . CATARACT EXTRACTION W/PHACO Right 10/01/2017   Procedure: CATARACT EXTRACTION PHACO AND INTRAOCULAR LENS PLACEMENT (IOC);  Surgeon: Baruch Goldmann, MD;  Location: [facility];  Service: Ophthalmology;  Laterality: Right;  CDE: 5.77  . COLONOSCOPY N/A 12/01/2013   Dr. Fields:moderate sized internal hemorrhoids/left colon is redundant  . DILITATION & CURRETTAGE/HYSTROSCOPY WITH NOVASURE ABLATION N/A 04/18/2014   Procedure: DILATATION & CURETTAGE/HYSTEROSCOPY WITH NOVASURE ENDOMETRIAL ABLATION;  Surgeon: Florian Buff, MD;  Location: [facility];  Service: Gynecology;  Laterality: N/A;  Uterine Cavity  Length 5cm     . ESOPHAGOGASTRODUODENOSCOPY N/A 12/01/2013   FYB:OFBP non erosive gastritis. +H.pylori   . ESOPHAGOGASTRODUODENOSCOPY (EGD) WITH PROPOFOL N/A 11/03/2018   Procedure: ESOPHAGOGASTRODUODENOSCOPY (EGD) WITH PROPOFOL;  Surgeon: Thornton Park, MD;  Location: [place];  Service: Gastroenterology;  Laterality: N/A;  . IR GASTROSTOMY TUBE MOD SED  11/04/2018  . NO PAST SURGERIES    . POLYPECTOMY N/A 04/18/2014   Procedure: ENDOMETRIAL POLYPECTOMY;  Surgeon: Florian Buff, MD;  Location: [facility];  Service: Gynecology;  Laterality: N/A;  . 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   . PMH: anxiety, dizziness, GERD, developmental delay, hypothyroidism, MSSA bacteremia aortic valve vegetation s/p AVR. PEG placement/mech soft diet.  CXR =Increased vascular congestion and interstitial edema. Some improved aeration is noted particularly in the mid left lung and right lungbase.   Assessment / Plan / Recommendation Clinical Impression  Pt seen at bedside for clinical assessment of swallow function and safety. Pt was awake and cooperative, but is nonvocal and does not follow commands. Unable to fully assess oral motor strength, however, no anterior leakage or residue noted. Pt is edentulous, and was unable to demonstrate the ability to effectively chew solid textures at this time, spitting out poorly chewed graham cracker. No overt s/s aspiration observed following trials of puree or thin liquids. Will downgrade diet to puree and thin liquid until pt is able to demonstrate the  ability to manage solids requiring mastication. SLP will follow for assessment of diet tolerance and readiness to advance. Safe swallow precautions  posted in pt room.    SLP Visit Diagnosis: Dysphagia, unspecified (R13.10)    Aspiration Risk  Mild aspiration risk    Diet Recommendation Dysphagia 1 (Puree);Thin liquid   Liquid Administration via: Straw Medication Administration: Crushed with puree Supervision: Full supervision/cueing for compensatory strategies Compensations: Slow rate;Small sips/bites Postural Changes: Seated upright at 90 degrees    Other  Recommendations Oral Care Recommendations: Oral care QID Other Recommendations: Have oral suction available   Follow up Recommendations 24 hour supervision/assistance      Frequency and Duration min 1 x/week  1 week;2 weeks       Prognosis Prognosis for Safe Diet Advancement: Fair Barriers to Reach Goals: Cognitive deficits      Swallow Study   General Date of Onset: 02-16-19 HPI: 58yo female admitted 2019-02-16 from [facility] hospital with positive blood cultures; 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   . PMH: anxiety, dizziness, GERD, developmental delay, hypothyroidism, MSSA bacteremia aortic valve vegetation s/p AVR. PEG placement/mech soft diet.  CXR =Increased vascular congestion and interstitial edema. Some improved aeration is noted particularly in the mid left lung and right lungbase. Type of Study: Bedside Swallow Evaluation Previous Swallow Assessment: BSE 10/08/2018 - rec dys2/thin. Diet Prior to this Study: Dysphagia 3 (soft);Thin liquids Temperature Spikes Noted: No Respiratory Status: Nasal cannula History of Recent Intubation: No Behavior/Cognition: Alert;Cooperative;Requires cueing;Doesn't follow directions Oral Cavity Assessment: Within Functional Limits Oral Care Completed by SLP: No Oral Cavity - Dentition: Edentulous Self-Feeding Abilities: Total assist Patient Positioning: Upright in bed Baseline Vocal Quality: Not observed Volitional Cough: Cognitively unable to elicit Volitional Swallow: Unable to elicit    Oral/Motor/Sensory Function  Overall Oral Motor/Sensory Function: Other (comment)(unable to fully assess)   Ice Chips Ice chips: (pt declined presentation of ice chips)   Thin Liquid Thin Liquid: Within functional limits Presentation: Straw    Nectar Thick Nectar Thick Liquid: Not tested   Honey Thick Honey Thick Liquid: Not tested   Puree Puree: Within functional limits Presentation: Spoon   Solid     Solid: Impaired Oral Phase Impairments: Impaired mastication Oral Phase Functional Implications: Other (comment)(pt spit out poorly chewed graham cracker)     Harlow Asa, MSP, CCC-SLP Speech Language Pathologist Office: [phone] Pager: [phone]  Leigh Aurora 2019/02/16,2:37 PM

## 2019-01-23 NOTE — Progress Notes (Signed)
Enterococcus and susceptibilities from 11/20 blood cultures

## 2019-01-23 NOTE — Progress Notes (Signed)
Pt admitted to the floor, transferred from [facility] via transport services. Alert with orientation difficult to assess due to cognitive disabilities. Foley catheter in place. Foley care done. Skin check completed. Vital signs taken. No sign of distress noted. On 3L O2 sat 93%. Bed low with wheels locked. Call bell in reach. Bed alarm turned on.

## 2019-01-23 NOTE — Progress Notes (Addendum)
Hospitalist progress note   NECOLE MINASSIAN [id number] DOB: February 02, 1961 DOA: 01/21/2019  PCP: Jani Gravel, MD   Narrative:  58 year old morbidly obese white lady Mild cognitive deficit Bipolar Postmenopausal bleeding status post work-up with endometrial ablation-pathology benign secretory changes 2016 Intractable constipation followed by gastroenterology [facility]-reflux Recent cataract surgery 09/2017 right eye  Reviewed [facility] reports-appears hospitalized X3 there -patient had pneumonia and was made DNR went back to [facility] came back in the same way she was weaned to room air speech cleared her for supplemental PEG feeds with oral mechanical soft diet she was diagnosed at that time with a tiny pulmonary embolus and Eliquis was started and BNP was elevated at 2800 Initially started on Rocephin and azithromycin cardiology was consulted because all 4 blood cultures grew Enterococcus and she was on vancomycin urine culture grew ESBL she was placed on meropenem TTE did show mitral valve endocarditis with a very high gradient and they recommended transfer back to [facility] for cardiology opinion She was also quite anemic and may have had a transfusion reaction Monday to try to give blood 10/01/18-->12/06/2018 sepsis, MSSA bacteremia in addition to large aortic valve vegetation-urgent aortic valve replacement complicated by respiratory failure renal failure, cholecystitis with elevated LFTs and nausea vomiting  [facility] [facility] with acute confusional state fever blood cultures growing Enterococcus in all 4 bottles UA showed ESBL UTI 2D echo showed Was started on vancomycin meropenem repeat cultures have been ordered   Assessment & Plan: Enterococcal bacteremia rule out endocarditis-have alerted cardiology to see patient will discuss with ID At this time continue vancomycin until sensitivities etc. can be narrowed-follow blood  cultures from 11/23 Addend-called [facility] [address] [facility] dept to get results--Blood cultures from 11/20 susc to amp/pencicillin and Vanc  ESBL UTI Continue meropenem-white count still up to 17  Normocytic anemia hemoglobin 7.1 Attempt transfusion today over 4 hours and premedicated if she has another reaction we will need to discuss implications  Small pulmonary embolism 12/2018 We will start Eliquis as renal function is poor cannot use Xarelto  Thrombocytopenia likely secondary to sepsis-seems to be resolving  acute component/AKI superimposed upon stage IIIb CKD continue free water in tube increased to every 6 hours as opposed to 8-repeat labs in a.m.  PEG tube 3 episodes of pneumonia-we will discuss with family implications-clearly failing to thrive was DNR at outside facility which was reversed however she probably will continue to aspirate Add Protonix 40 daily  Depression continue Prozac 1 tab daily hyperlipidemia continue Crestor 5 daily  Hypothyroidism continue Synthroid 125   Patient's aunt called and updated on telephone: Eliquis Full code confirmed Unclear disposition at this time  Subjective: Awake coherent can tell me she is at [facility] but slow mentation at this time No fever no chills no other complaints Consultants:   Infectious disease  Cardiology Procedures:   None yet Antimicrobials:   Currently meropenem and vancomycin Data Reviewed:  Labs BUN/creatinine 36/2.0 up from 64/1.8 on admission alk phos 166 LFTs otherwise normal WBC 17.5 hemoglobin down from 7.5-7.1 platelet up from 94-1 48 sed rate 98 Increasing vascular congestion on CXR 11/23 Radiology Studies:   Objective: Vitals:   01/14/2019 0133 01/17/2019 0135 01/11/2019 0440 01/24/2019 0450  BP: 95/60 95/60 100/61   Pulse: 92 91 82 80  Resp:   (!) 49 20  Temp: 98.2 F (36.8 C)  98.8 F (37.1 C)   TempSrc: Oral  Oral   SpO2: 96%  98% 97% 98%  Weight: 64.6 kg       Intake/Output Summary (Last  24 hours) at 01/16/2019 0379 Last data filed at 01/06/2019 0440 Gross per 24 hour  Intake -  Output 300 ml  Net -300 ml   Filed Weights   01/10/2019 0133  Weight: 64.6 kg    Examination: Awake coherent no distress, confused focal deficit per se however EOMI NCAT no focal deficit S1-S2 HSM across L5ICS LLSB Abdomen soft PEG tube in place No lower extremity edema Chest is clear Neurologically intact but slow mentation  Scheduled Meds: . FLUoxetine  10 mg Per Tube QHS  . free water  200 mL Per Tube Q8H  . levothyroxine  150 mcg Per Tube Q0600  . metoCLOPramide  10 mg Per Tube TID AC & HS  . metoprolol tartrate  25 mg Per Tube BID  . pantoprazole sodium  40 mg Per Tube Daily  . polyethylene glycol  17 g Per Tube Q M,W,F  . rosuvastatin  5 mg Per Tube Daily  . tamsulosin  0.4 mg Oral Daily   Continuous Infusions: . meropenem (MERREM) IV 1 g (01/01/2019 0628)     LOS: 0 days   Time spent: Purple Sage, MD [facility]  01/20/2019, 7:12 AM

## 2019-01-23 NOTE — Progress Notes (Signed)
Pharmacy Antibiotic Note  Audrey Collier is a 58 y.o. female admitted on 01/22/2019 with Enterococcus bacteremia and ESBL  UTI.  Pharmacy has been consulted for Vancomycin/Merrem dosing. Pt is a transfer from [facility]. WBC is elevated. Noted renal dysfunction.   Antibiotics at [facility] Vancomycin-last dose 1g on 11/22 at 0556 Merrem-last dose 500mg  on 11/22 at 11:11  Labs at [facility] hospital 11/20 >> 11/22 Scr 1.58>>1.57 CRP 11.4 PCT 1.28 D-dimer 2.76  Vancomcyin random level is high at 27, drawn 24hr after last dose. Scr is also higher than her reported BL, unclear trend. Will hold vancomycin dose and repeat level in 12 hrs to check for clearance. When vancomycin level is < 15, if Scr remains ~2, can start 500mg  Q24 hr.  11/23 Vancomycin 500mg  Q24hr Scr used: 2.01 Est AUC: 435.7  Plan: -Repeat Vancomycin level at 1800 -When level is < 15, start vancomycin dose based on latest renal fx and est AUC -Merrem 1000 mg IV q12h -Trend WBC, temp, renal function  -F/U infectious work-up   Weight: 142 lb 6.7 oz (64.6 kg)  Temp (24hrs), Avg:98.5 F (36.9 C), Min:98.2 F (36.8 C), Max:98.8 F (37.1 C)  Recent Labs  Lab 01/06/2019 0324 01/21/2019 0621  WBC 17.5*  --   CREATININE 2.01*  --   VANCORANDOM  --  27    Estimated Creatinine Clearance: 27.3 mL/min (A) (by C-G formula based on SCr of 2.01 mg/dL (H)).    No Known Allergies  Antimicrobials this admission: Vancomycin 11/22 >>  Meropenem 11/22 >>  Piptazo 11/21  Levoflox 11/20  Microbiology results: 11/20 BCx [facility] hospital): Enterococcus S-vanc 11/20 UCx([facility]): ESBL Klebsiella S-mero  11/20 COVID: neg     Benetta Spar, PharmD, BCPS, BCCP Clinical Pharmacist  Please check AMION for all [facility] phone numbers After 10:00 PM, call [facility] [phone]

## 2019-01-23 NOTE — Progress Notes (Signed)
  Echocardiogram 2D Echocardiogram has been performed.  Audrey Collier 01/16/2019, 5:16 PM

## 2019-01-23 NOTE — Consult Note (Signed)
[facility]    Date of Admission:  02-09-19     Total days of antibiotics 3  Meropenem 1            Reason for Consult: Enterococal bacteremia, ?PVE    Referring Provider: Mahala Menghini  Primary Care Provider: Pearson Grippe, MD     Assessment: Audrey Collier is a 58 y.o. female transferred from [facility] in [place], [place] with concern for 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    Since transfer she has been afebrile, leucocytosis to 17.5K and hemodynamically stable. She has been on meropenem IV for treatment and repeat blood cultures so far on 11/22 are no growth to date. On exam she has a systolic murmur over right sternal boarder with concern over PVE. Will start Ampicillin + Ceftriaxone IV for treatment; her creatinine is already 2.25 on admission and would like to avoid synergistic gentamicin here to prevent further damage. TEE for definitive assessment scheduled 11/25. Follow repeated blood cultures and hold on PICC for now please.   Unclear if she has UTI or asymptomatic bacteriuria given poor ability to recount events and symptoms - will treat with fosfomycin x 1 dose.    Plan: 1. TEE 11/25 2. Cardiology following 3. Fosfomycin x 1 dose for UTI 4. 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 Hold on PICC for now please.     Principal Problem:   Bacteremia due to Enterococcus Active Problems:   Bacteriuria, ESBL Klebsiella    S/P AVR (aortic valve replacement)   Nausea & vomiting   . apixaban  5 mg Oral BID  . FLUoxetine  10 mg Per Tube QHS  . fosfomycin  3 g Oral Once  . free water  200 mL Per Tube Q6H  . levothyroxine  150 mcg Per Tube Q0600  . metoCLOPramide  10 mg Per Tube TID AC & HS  . metoprolol tartrate  25 mg Per Tube BID  . pantoprazole sodium  40 mg Per Tube Daily  . polyethylene glycol  17 g Per Tube Q M,W,F  . rosuvastatin  5 mg Per Tube Daily  . tamsulosin  0.4 mg Oral Daily     HPI: [place] Audrey Collier is a 58 y.o. female transferred from [facility] after she was found to have enterococcal bacteremia with concern over PVE.  She has a pmhx of developmental delay, hypothyroidism and recently AV replacement for MSSA endocarditis in 10-2018. TTE was done at [facility] with report over concern for vegetation of the mitral valve - she was transferred to [facility] for further management and CT surgery/Cardiology consult. She was also found to have ESBL klebsiella in her urine and treated with meropenem.   Ms. Herrmann does not offer much with answering questions today but does not indicate she has had any lower abdominal pain, sweating, fevers/chills. She does not recall coming to the hospital initially.   Called [facility] and reported: Blood cultures drawn 01/20/19 - enterococcal faecalis (S-ampicillin, PCN, Vanc)    Review of Systems: Review of Systems  Constitutional: Negative for chills, fever, malaise/fatigue and weight loss.  HENT: Negative for sore throat.        No dental problems  Respiratory: Negative for cough and sputum production.   Cardiovascular: Negative for chest pain and leg swelling.  Gastrointestinal: Negative for abdominal pain, diarrhea and vomiting.  Genitourinary: Negative for dysuria and flank pain.  Musculoskeletal: Negative for joint  pain, myalgias and neck pain.  Skin: Negative for rash.  Neurological: Negative for dizziness, tingling and headaches.  Psychiatric/Behavioral: Negative for depression and substance abuse. The patient is not nervous/anxious and does not have insomnia.     Past Medical History:  Diagnosis Date  . Anxiety   . Dizziness   . Endometrial polyp 03/26/2014  . Fecal occult blood test positive 03/15/2013   will send 3 cards home and refer to GI for colonoscopy  . GERD (gastroesophageal reflux disease)   . Hypothyroidism   . Mental disorder    mentally challenged  . PMB (postmenopausal bleeding) 03/26/2014   . Thickened endometrium 03/26/2014   ?polyp will get HSG    Social History   Tobacco Use  . Smoking status: Never Smoker  . Smokeless tobacco: Never Used  . Tobacco comment: Never smoked  Substance Use Topics  . Alcohol use: No    Alcohol/week: 0.0 standard drinks  . Drug use: No    Family History  Problem Relation Age of Onset  . Diabetes Maternal Aunt   . Hypertension Maternal Aunt   . Diabetes Maternal Uncle   . Colon cancer Neg Hx    No Known Allergies  OBJECTIVE: Blood pressure (!) 90/56, pulse 66, temperature 97.8 F (36.6 C), temperature source Axillary, resp. rate 20, weight 64.6 kg, last menstrual period 04/16/2013, SpO2 100 %.   Physical Exam Vitals signs and nursing note reviewed.  HENT:     Mouth/Throat:     Mouth: No oral lesions.     Dentition: No dental abscesses.  Cardiovascular:     Rate and Rhythm: Normal rate and regular rhythm.     Heart sounds: Murmur (RUSB systolic murmur 2/6) present.  Pulmonary:     Effort: Pulmonary effort is normal.     Breath sounds: Normal breath sounds.  Abdominal:     General: There is no distension.     Palpations: Abdomen is soft.     Tenderness: There is no abdominal tenderness.  Musculoskeletal: Normal range of motion.        General: No tenderness.  Lymphadenopathy:     Cervical: No cervical adenopathy.  Skin:    General: Skin is warm and dry.     Findings: No rash.  Neurological:     Mental Status: She is alert and oriented to person, place, and time.  Psychiatric:        Judgment: Judgment normal.     Lab Results Lab Results  Component Value Date   WBC 17.5 (H) 01/20/2019   HGB 7.1 (L) 01/05/2019   HCT 22.9 (L) 01/03/2019   MCV 89.5 01/22/2019   PLT 148 (L) 01/12/2019    Lab Results  Component Value Date   CREATININE 2.01 (H) 01/06/2019   BUN 36 (H) 01/06/2019   NA 138 01/27/2019   K 3.8 01/20/2019   CL 105 01/15/2019   CO2 24 01/19/2019    Lab Results  Component Value Date   ALT 24  01/14/2019   AST 38 01/08/2019   ALKPHOS 166 (H) 01/27/2019   BILITOT 0.7 01/22/2019     Microbiology: Recent Results (from the past 240 hour(s))  MRSA PCR Screening     Status: None   Collection Time: 01/19/2019  2:53 AM   Specimen: Nasopharyngeal  Result Value Ref Range Status   MRSA by PCR NEGATIVE NEGATIVE Final    Comment:        The GeneXpert MRSA Assay (FDA approved for NASAL specimens only),  is one component of a comprehensive MRSA colonization surveillance program. It is not intended to diagnose MRSA infection nor to guide or monitor treatment for MRSA infections. Performed at [facility], [address] [place], [place] [address]     Rexene AlbertsStephanie Noni Stonesifer, MSN, NP-C [facility]  [email or web address] Pager: [phone] Office: [phone] [facility] Main Line: [phone]

## 2019-01-24 DIAGNOSIS — B952 Enterococcus as the cause of diseases classified elsewhere: Secondary | ICD-10-CM

## 2019-01-24 DIAGNOSIS — I340 Nonrheumatic mitral (valve) insufficiency: Secondary | ICD-10-CM

## 2019-01-24 DIAGNOSIS — N179 Acute kidney failure, unspecified: Secondary | ICD-10-CM | POA: Diagnosis not present

## 2019-01-24 DIAGNOSIS — Z952 Presence of prosthetic heart valve: Secondary | ICD-10-CM | POA: Diagnosis not present

## 2019-01-24 DIAGNOSIS — R7881 Bacteremia: Secondary | ICD-10-CM | POA: Diagnosis not present

## 2019-01-24 DIAGNOSIS — I330 Acute and subacute infective endocarditis: Secondary | ICD-10-CM | POA: Diagnosis not present

## 2019-01-24 LAB — BASIC METABOLIC PANEL
Anion gap: 8 (ref 5–15)
BUN: 30 mg/dL — ABNORMAL HIGH (ref 6–20)
CO2: 24 mmol/L (ref 22–32)
Calcium: 8.9 mg/dL (ref 8.9–10.3)
Chloride: 109 mmol/L (ref 98–111)
Creatinine, Ser: 1.44 mg/dL — ABNORMAL HIGH (ref 0.44–1.00)
GFR calc Af Amer: 47 mL/min — ABNORMAL LOW (ref >60)
GFR calc non Af Amer: 40 mL/min — ABNORMAL LOW (ref >60)
Glucose, Bld: 93 mg/dL (ref 70–99)
Potassium: 3.8 mmol/L (ref 3.5–5.1)
Sodium: 141 mmol/L (ref 135–145)

## 2019-01-24 LAB — CBC WITH DIFFERENTIAL/PLATELET
Abs Immature Granulocytes: 0.81 10*3/uL — ABNORMAL HIGH (ref 0.00–0.07)
Basophils Absolute: 0.1 10*3/uL (ref 0.0–0.1)
Basophils Relative: 1 %
Eosinophils Absolute: 1.2 10*3/uL — ABNORMAL HIGH (ref 0.0–0.5)
Eosinophils Relative: 8 %
HCT: 28 % — ABNORMAL LOW (ref 36.0–46.0)
Hemoglobin: 8.8 g/dL — ABNORMAL LOW (ref 12.0–15.0)
Immature Granulocytes: 5 %
Lymphocytes Relative: 5 %
Lymphs Abs: 0.7 10*3/uL (ref 0.7–4.0)
MCH: 26.6 pg (ref 26.0–34.0)
MCHC: 31.4 g/dL (ref 30.0–36.0)
MCV: 84.6 fL (ref 80.0–100.0)
Monocytes Absolute: 0.8 10*3/uL (ref 0.1–1.0)
Monocytes Relative: 6 %
Neutro Abs: 11.6 10*3/uL — ABNORMAL HIGH (ref 1.7–7.7)
Neutrophils Relative %: 75 %
Platelets: 116 10*3/uL — ABNORMAL LOW (ref 150–400)
RBC: 3.31 MIL/uL — ABNORMAL LOW (ref 3.87–5.11)
RDW: 21.2 % — ABNORMAL HIGH (ref 11.5–15.5)
WBC: 15.3 10*3/uL — ABNORMAL HIGH (ref 4.0–10.5)
nRBC: 0.2 % (ref 0.0–0.2)

## 2019-01-24 LAB — GLUCOSE, CAPILLARY
Glucose-Capillary: 104 mg/dL — ABNORMAL HIGH (ref 70–99)
Glucose-Capillary: 108 mg/dL — ABNORMAL HIGH (ref 70–99)
Glucose-Capillary: 116 mg/dL — ABNORMAL HIGH (ref 70–99)
Glucose-Capillary: 121 mg/dL — ABNORMAL HIGH (ref 70–99)
Glucose-Capillary: 87 mg/dL (ref 70–99)
Glucose-Capillary: 87 mg/dL (ref 70–99)

## 2019-01-24 LAB — PHOSPHORUS: Phosphorus: 4.2 mg/dL (ref 2.5–4.6)

## 2019-01-24 LAB — MAGNESIUM: Magnesium: 1.9 mg/dL (ref 1.7–2.4)

## 2019-01-24 MED ORDER — PRO-STAT SUGAR FREE PO LIQD
30.0000 mL | Freq: Three times a day (TID) | ORAL | Status: DC
  Administered 2019-01-24 – 2019-01-25 (×3): 30 mL
  Filled 2019-01-24 (×3): qty 30

## 2019-01-24 MED ORDER — JEVITY 1.5 CAL/FIBER PO LIQD
1000.0000 mL | ORAL | Status: DC
  Filled 2019-01-24 (×2): qty 1000

## 2019-01-24 NOTE — Progress Notes (Signed)
Hospitalist progress note   Audrey Collier [id number] DOB: 12-07-1960 DOA: 01/05/2019  PCP: Pearson Grippe, MD   Narrative:  58 year old morbidly obese white lady Mild cognitive deficit Bipolar Postmenopausal bleeding status post work-up with endometrial ablation-pathology benign secretory changes 2016 Intractable constipation followed [facility]enter[facility]-reflux Recent cataract surgery 09/2017 right eye Hospitalized 10/01/18-->12/06/2018 sepsis, MSSA bacteremia in addition to large aortic valve vegetation-urgent aortic valve replacement complicated by respiratory failure renal failure, cholecystitis with elevated LFTs and nausea vomiting  Reviewed [facility] reports-appears hospitalized X3 there -patient had pneumonia and was made DNR went back to [facility] came back in the same way she was weaned to room air speech cleared her for supplemental PEG feeds with oral mechanical soft diet she was diagnosed at that time with a tiny pulmonary embolus and Eliquis was started and BNP was elevated at 2800 Initially started on Rocephin and azithromycin cardiology was consulted because all 4 blood cultures grew Enterococcus and she was on vancomycin urine culture grew ESBL she was placed on meropenem TTE did show mitral valve endocarditis with a very high gradient and they recommended transfer back to [facility] for cardiology opinion She was also quite anemic and may have had a transfusion reaction Monday to try to give blood  Returns to [facility] from [place] hospital with acute confusional state fever blood cultures growing Enterococcus in all 4 bottles UA showed ESBL UTI 2D echo showed Was started on vancomycin meropenem repeat cultures have been ordered   Assessment & Plan: Enterococcal bacteremia rule out endocarditis-cardiology and ID seeing 2d echo shows Veg on MVL-planning for TEE Abx narrowed to Ampicillin and Ceftriaxone--defer duration to ID--likely  6 weeks CVTS has been consulted to figure out if there is a surgical option for management Continue metoprolol 25 bid  ESBL UTI Merrem changed to Fosfomycin x 1 and stopped per ID-white countbetter  Normocytic anemia hemoglobin 7.1 transfused 1 U without complication 11/23  Small pulmonary embolism 12/2018 We will start Eliquis as renal function is poor cannot use Xarelto  Thrombocytopenia likely secondary to sepsis-seems to be resolving  acute component/AKI superimposed upon stage IIIb CKD continue free water in tube increased to every 6 hours as opposed to 8-repeat labs in a.m.  PEG tube 3 episodes of pneumonia-we will discuss with family implications-clearly failing to thrive was DNR at outside facility which was reversed however she probably will continue to aspirate Add Protonix 40 daily Resume peg feeds going forward  Depression continue Prozac 1 tab daily hyperlipidemia continue Crestor 5 daily  Hypothyroidism continue Synthroid 125   At bedside aunt  updated =: Eliquis Full code confirmed Unclear disposition at this time  Subjective: Awakens speaking a little more but not completely coherent Food remains untouched at bedside no fever no chills Poor ROS given mental state  Consultants:   Infectious disease  Cardiology Procedures:   None yet Antimicrobials:   Currently meropenem and vancomycin Data Reviewed:  Labs BUN/creatinine 36/2.0 up from 64/1.8--->30/1.44 WBC 17.5--?15  hemoglobin down from 7.5-7.1--1 U prbs 8.8  platelet 116  sed rate 98 Increasing vascular congestion on CXR 11/23 Radiology Studies:   Objective: Vitals:   01/24/19 1000 01/24/19 1156 01/24/19 1200 01/24/19 1420  BP: 105/68 103/67 101/66   Pulse: 76 74 74   Resp: 20  (!) 23   Temp:  98.3 F (36.8 C)    TempSrc:  Oral    SpO2: 100% 100% 98%   Weight:  Height:    5\' 2"  (1.575 m)    Intake/Output Summary (Last 24 hours) at 01/24/2019 1439 Last data filed at  01/24/2019 0410 Gross per 24 hour  Intake 490 ml  Output 950 ml  Net -460 ml   Filed Weights   01/02/2019 0133 01/24/19 0406  Weight: 64.6 kg 65.4 kg    Examination: coherent no distress, confused focal deficit per se however EOMI NCAT no focal deficit S1-S2 HSM across L5ICS LLSB Abdomen soft PEG tube in place No lower extremity edema Chest is clear Neurologically intact but slow mentation  Scheduled Meds: . apixaban  5 mg Oral BID  . Chlorhexidine Gluconate Cloth  6 each Topical Daily  . feeding supplement (PRO-STAT SUGAR FREE 64)  30 mL Per Tube TID  . FLUoxetine  10 mg Per Tube QHS  . free water  200 mL Per Tube Q6H  . levothyroxine  150 mcg Per Tube Q0600  . mouth rinse  15 mL Mouth Rinse BID  . metoCLOPramide  10 mg Per Tube TID AC & HS  . metoprolol tartrate  25 mg Per Tube BID  . pantoprazole sodium  40 mg Per Tube Daily  . polyethylene glycol  17 g Per Tube Q M,W,F  . rosuvastatin  5 mg Per Tube Daily  . tamsulosin  0.4 mg Oral Daily   Continuous Infusions: . ampicillin (OMNIPEN) IV 2 g (01/24/19 1329)  . cefTRIAXone (ROCEPHIN)  IV 2 g (01/24/19 0955)  . feeding supplement (JEVITY 1.5 CAL/FIBER)       LOS: 1 day   Time spent: [place], MD [facility]  01/24/2019, 2:39 PM

## 2019-01-24 NOTE — Progress Notes (Addendum)
Progress Note  Patient Name: Audrey Collier Date of Encounter: 01/24/2019  Primary Cardiologist: Armanda Magic, MD   Subjective   Denies any chest pain or SOB.  Inpatient Medications    Scheduled Meds: . apixaban  5 mg Oral BID  . Chlorhexidine Gluconate Cloth  6 each Topical Daily  . FLUoxetine  10 mg Per Tube QHS  . free water  200 mL Per Tube Q6H  . levothyroxine  150 mcg Per Tube Q0600  . mouth rinse  15 mL Mouth Rinse BID  . metoCLOPramide  10 mg Per Tube TID AC & HS  . metoprolol tartrate  25 mg Per Tube BID  . pantoprazole sodium  40 mg Per Tube Daily  . polyethylene glycol  17 g Per Tube Q M,W,F  . rosuvastatin  5 mg Per Tube Daily  . tamsulosin  0.4 mg Oral Daily   Continuous Infusions: . ampicillin (OMNIPEN) IV 2 g (01/24/19 0540)  . cefTRIAXone (ROCEPHIN)  IV 2 g (Jan 30, 2019 2225)   PRN Meds: acetaminophen **OR** acetaminophen, ondansetron **OR** ondansetron (ZOFRAN) IV   Vital Signs    Vitals:   01/30/19 2300 01/24/19 0000 01/24/19 0406 01/24/19 0815  BP: 97/64 101/65 107/71 91/80  Pulse: 74 76 81 77  Resp: 20 14 (!) 23 (!) 22  Temp:   97.9 F (36.6 C) 98.8 F (37.1 C)  TempSrc:  Oral Oral Oral  SpO2: 100% 100% 100% 99%  Weight:   65.4 kg     Intake/Output Summary (Last 24 hours) at 01/24/2019 0919 Last data filed at 01/24/2019 0410 Gross per 24 hour  Intake 490 ml  Output 950 ml  Net -460 ml   Filed Weights   01-30-2019 0133 01/24/19 0406  Weight: 64.6 kg 65.4 kg    Telemetry    NSR - Personally Reviewed  ECG    No new EKG to review - Personally Reviewed  Physical Exam   GEN: No acute distress.   Neck: No JVD Cardiac: RRR, no murmurs, rubs, or gallops.  Respiratory: Clear to auscultation bilaterally. GI: Soft, nontender, non-distended  MS: No edema; No deformity. Neuro:  Nonfocal  Psych: Normal affect   Labs    Chemistry Recent Labs  Lab 2019-01-30 0324 01/24/19 0312  NA 138 141  K 3.8 3.8  CL 105 109  CO2 24 24   GLUCOSE 114* 93  BUN 36* 30*  CREATININE 2.01* 1.44*  CALCIUM 8.7* 8.9  PROT 5.0*  --   ALBUMIN 2.0*  --   AST 38  --   ALT 24  --   ALKPHOS 166*  --   BILITOT 0.7  --   GFRNONAA 27* 40*  GFRAA 31* 47*  ANIONGAP 9 8     Hematology Recent Labs  Lab 01/30/19 0324 2019/01/30 1959 01/24/19 0312  WBC 17.5*  --  15.3*  RBC 2.56*  2.56*  --  3.31*  HGB 7.1* 8.7* 8.8*  HCT 22.9* 28.6* 28.0*  MCV 89.5  --  84.6  MCH 27.7  --  26.6  MCHC 31.0  --  31.4  RDW 16.5*  --  21.2*  PLT 148*  --  116*    Cardiac EnzymesNo results for input(s): TROPONINI in the last 168 hours. No results for input(s): TROPIPOC in the last 168 hours.   BNPNo results for input(s): BNP, PROBNP in the last 168 hours.   DDimer No results for input(s): DDIMER in the last 168 hours.   Radiology  Dg Abd 1 View  Result Date: 01/29/2019 CLINICAL DATA:  Abdominal pain EXAM: ABDOMEN - 1 VIEW COMPARISON:  None. FINDINGS: Gastrostomy catheter is noted in satisfactory position. Scattered large and small bowel gas is noted. No free air is seen. No acute bony abnormality is noted. IMPRESSION: No acute abnormality seen. Electronically Signed   By: Alcide CleverMark  Lukens M.D.   On: 01/09/2019 03:06   Dg Chest Port 1 View  Result Date: 01/02/2019 CLINICAL DATA:  Shortness of breath EXAM: PORTABLE CHEST 1 VIEW COMPARISON:  11/30/2018 FINDINGS: Cardiac shadow remains enlarged. Postsurgical changes are seen. Vascular congestion is noted with mild interstitial edema. The degree of patchy airspace opacity has improved somewhat in the interval from the prior exam. Previously seen right pleural effusion has resolved in the interval. IMPRESSION: Increased vascular congestion and interstitial edema. Some improved aeration is noted particularly in the mid left lung and right lung base. Electronically Signed   By: Alcide CleverMark  Lukens M.D.   On: 01/22/2019 03:09    Cardiac Studies   2D echo 01/13/2019 IMPRESSIONS   1. Highly suggestive of  multiple valve endocarditis. Mitral valve with moderate MR, visible vegetation on anterior leaflet not previously seen, and images suggest involvement of posterior leaflet as well. Bioprosthetic aortc valve leaflets appear  thickened, restricted, with severe AS by gradients. There may also be a vegetation on the ventricular aspect of the aortic valve. There is a linear echodensity in RVOT, cannot see where it attaches, cannot exclude pulmonic valve endocarditis. Recommend  TEE for further evaluation.  2. Left ventricular ejection fraction, by visual estimation, is 50 to 55%. The left ventricle has low normal function. There is mildly increased left ventricular hypertrophy.  3. Left ventricular diastolic parameters are consistent with Grade II diastolic dysfunction (pseudonormalization).  4. The left ventricle has no regional wall motion abnormalities.  5. Global right ventricle has mildly reduced systolic function.The right ventricular size is normal. Right vetricular wall thickness was not assessed.  6. Left atrial size was severely dilated.  7. Right atrial size was mildly dilated.  8. Small pericardial effusion.  9. Moderate vegetation on the mitral valve. 10. The mitral valve is abnormal. Moderate mitral valve regurgitation. 11. New mobile echodensity on anterior mitral valve leaflet, on atrial aspect of leaflet. Prior TEE images reviewed, not previously visualized. On image 14, there also may be additional involvement of posterior leaflet. 12. The tricuspid valve is grossly normal. Tricuspid valve regurgitation moderate-severe. 13. Aortic valve regurgitation is trivial. Severe aortic valve stenosis. 14. 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 There appears to be an echodensity seen (see image 11) on ventricular side of valve. No significant regurgitation. Leaflets appear thickened and do not open fully. Increased valve gradient,  mean gradient 43 mmHg. Recommend further  evaluation with TEE. 15. The pulmonic valve was grossly normal. Pulmonic valve regurgitation is mild to moderate. 16. There is a linear echodenisity seen in the RVOT, cannot clearly see to where it attaches. Cannot exclude pulmonary valve endocarditis. 17. The aortic root was not well visualized. 18. Moderately elevated pulmonary artery systolic pressure. 19. The tricuspid regurgitant velocity is 3.23 m/s, and with an assumed right atrial pressure of 8 mmHg, the estimated right ventricular systolic pressure is moderately elevated at 49.6 mmHg. 20. The inferior vena cava is normal in size with <50% respiratory variability, suggesting right atrial pressure of 8 mmHg. 21. The interatrial septum was not well visualized.   Patient Profile     58  y.o. female  with a hx of developmental delay, hypothyroidism, endocarditis s/p aortic valve replacement August 5329 with complicated admission of 2 months, and two recent admissions in October for PNA and Heart Failure who is being seen for the evaluation of endocarditis at the request of Dr. Verlon Au.  Assessment & Plan    Bacteremia/ESBL UTI/ History of AV replacement/Mitral valve endocarditis Patient presented to [facility] hospital 01/20/19 with AMS and fever found to have positive blood cultures and UTI started on vanc/meropenem. H/o of Aortic valve replacement in August 9242 with complicated 2 month hospitalization. Recent TTE showed Mitral valve endocarditis and increased AV gradient. Patient is on Eliquis for small PE. Hgb down to 7 - WBC is up 18.6K but patient has been afebrile and trending down to 15.3K today - 2D echo with visible veg on both anterior and posterior MVL, possible veg on ventricular aspect of bioprosthetic AVR with severe AS, possible veg on PV, moderate MR, moderate to severe TR - Plan for TEE tomorrow -continue antibx per ID  H/o AV replacement/Mitral valve endocarditis - Recent TEE showed increased AV gradient and MV  endocarditis.  - plan for TEE tomorrow  AKI - creatinine up from baseline. 2.01. baseline around 1.7 - improved today and down to 1.44  Hyperlipidemia - continue statin  Acute on Chronic anemia - baseline around 8 - Hgb 7.1 on admit . -transfusion yesterday with Hbg up to 8.8 today - daily CBC  Peg tube - on soft diet  H/o small PE - Eliquis  I have spent a total of 35 minutes with patient reviewing 2D echo , telemetry, EKGs, labs and examining patient as well as establishing an assessment and plan that was discussed with the patient.  > 50% of time was spent in direct patient care.        For questions or updates, please contact [place] Please consult [email or web address] for contact info under Cardiology/STEMI.      Signed, Fransico Him, MD  01/24/2019, 9:19 AM

## 2019-01-24 NOTE — Progress Notes (Signed)
[facility] for Infectious Disease    Date of Admission:  01/05/2019   Total days of antibiotics 2   ID: Audrey Collier is a 58 y.o. female with  Principal Problem:   Bacteremia due to Enterococcus Active Problems:   S/P AVR (aortic valve replacement)   Nausea & vomiting   Bacteriuria, ESBL Klebsiella     Subjective: Afebrile, TTE showing vegetation  Medications:  . apixaban  5 mg Oral BID  . Chlorhexidine Gluconate Cloth  6 each Topical Daily  . feeding supplement (PRO-STAT SUGAR FREE 64)  30 mL Per Tube TID  . FLUoxetine  10 mg Per Tube QHS  . free water  200 mL Per Tube Q6H  . levothyroxine  150 mcg Per Tube Q0600  . mouth rinse  15 mL Mouth Rinse BID  . metoCLOPramide  10 mg Per Tube TID AC & HS  . metoprolol tartrate  25 mg Per Tube BID  . pantoprazole sodium  40 mg Per Tube Daily  . polyethylene glycol  17 g Per Tube Q M,W,F  . rosuvastatin  5 mg Per Tube Daily  . tamsulosin  0.4 mg Oral Daily    Objective: Vital signs in last 24 hours: Temp:  [97.6 F (36.4 C)-98.8 F (37.1 C)] 98.3 F (36.8 C) (11/24 1555) Pulse Rate:  [71-81] 80 (11/24 1600) Resp:  [14-23] 17 (11/24 1600) BP: (91-107)/(61-80) 107/69 (11/24 1600) SpO2:  [97 %-100 %] 100 % (11/24 1600) Weight:  [65.4 kg] 65.4 kg (11/24 0406) Physical Exam  Constitutional:  oriented to person, place, and time. appears well-developed and well-nourished. No distress.  HENT: [place]/AT, PERRLA, no scleral icterus Mouth/Throat: Oropharynx is clear and moist. No oropharyngeal exudate.  Cardiovascular: Normal rate, regular rhythm and normal heart sounds. + systolic murmur Pulmonary/Chest: Effort normal and breath sounds normal. No respiratory distress.  has no wheezes.  Neck = supple, no nuchal rigidity Abdominal: Soft. Bowel sounds are normal.  exhibits no distension. There is no tenderness.  Lymphadenopathy: no cervical adenopathy. No axillary adenopathy Neurological: alert and oriented to person, place,  and time.  Skin: Skin is warm and dry. No rash noted. No erythema.  Psychiatric: a normal mood and affect.  behavior is normal.    Lab Results Recent Labs    01/21/2019 0324 01/12/2019 1959 01/24/19 0312  WBC 17.5*  --  15.3*  HGB 7.1* 8.7* 8.8*  HCT 22.9* 28.6* 28.0*  NA 138  --  141  K 3.8  --  3.8  CL 105  --  109  CO2 24  --  24  BUN 36*  --  30*  CREATININE 2.01*  --  1.44*   Liver Panel Recent Labs    01/14/2019 0324  PROT 5.0*  ALBUMIN 2.0*  AST 38  ALT 24  ALKPHOS 166*  BILITOT 0.7   Sedimentation Rate Recent Labs    01/18/2019 0324  ESRSEDRATE 98*   C-Reactive Protein No results for input(s): CRP in the last 72 hours.  Microbiology:  Studies/Results: Dg Abd 1 View  Result Date: 01/21/2019 CLINICAL DATA:  Abdominal pain EXAM: ABDOMEN - 1 VIEW COMPARISON:  None. FINDINGS: Gastrostomy catheter is noted in satisfactory position. Scattered large and small bowel gas is noted. No free air is seen. No acute bony abnormality is noted. IMPRESSION: No acute abnormality seen. Electronically Signed   By: Inez Catalina M.D.   On: 01/04/2019 03:06   Dg Chest Port 1 View  Result Date: 01/30/2019 CLINICAL  DATA:  Shortness of breath EXAM: PORTABLE CHEST 1 VIEW COMPARISON:  11/30/2018 FINDINGS: Cardiac shadow remains enlarged. Postsurgical changes are seen. Vascular congestion is noted with mild interstitial edema. The degree of patchy airspace opacity has improved somewhat in the interval from the prior exam. Previously seen right pleural effusion has resolved in the interval. IMPRESSION: Increased vascular congestion and interstitial edema. Some improved aeration is noted particularly in the mid left lung and right lung base. Electronically Signed   By: Alcide Clever M.D.   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 = continue on ampicillin plus ceftriaxone dual therapy. Avoiding gentamicin due aki.  AVR is now stenosed-question whether she has involvement of her prosthetic valve but has definite vegetation as seen on MV. Agree with plan to get TEE for better evaluation of valves and burden of infection. After TEE, can have Cardiology weighed in to whether or no a surgical candidate. she has numerous comorbidities that redo valve would be higher risk. Still would recommend to let Dr Cliffton Asters, her CT surgeon know of her recent infection and see if he agrees with medical management.   She is high risk for CNS embolization while being treated for endocarditis. Will need to review this information with her care providers/family/decision makers.  Continue on amp/ceftriaxone. Will follow up on blood cx to decide when to place picc line  aki = improving  ESBL uti = now treated. Recommend removal of urinary catheter and switch to dry wick   [facility] for Infectious Diseases Cell: [phone] Pager: [phone]  01/24/2019, 7:49 PM

## 2019-01-24 NOTE — Progress Notes (Signed)
Initial Nutrition Assessment  DOCUMENTATION CODES:   Non-severe (moderate) malnutrition in context of acute illness/injury  INTERVENTION:   Tube feeding:  -Jevity 1.5 @ 50 ml/hr via PEG (1200 ml) -30 ml Prostat TID -Free water flushes 200 ml Q6 hours   TF provides: 2100 kcals, 122 grams protein, 912 ml free water (1712 ml with flushes). Meets 100% of needs.   NUTRITION DIAGNOSIS:   Moderate Malnutrition related to acute illness(s/p AVR with complications) as evidenced by mild fat depletion, moderate fat depletion, percent weight loss.  GOAL:   Patient will meet greater than or equal to 90% of their needs  MONITOR:   PO intake, Supplement acceptance, Diet advancement, Weight trends, Labs, I & O's, Skin  REASON FOR ASSESSMENT:   Consult Enteral/tube feeding initiation and management  ASSESSMENT:   Patient with PMH significant for developmental delay, GERD, recent MSSA bacteremia aortic valve vegetation s/p AVR, and s/p PEG due to poor oral intake. Presents this admission with enterococcus bacteremia with UTI.   Attempted to all [name], her aunt, but no answer. Pt reports eating by mouth and using PEG PTA. Unable to provide details on which type of formula or how much she was able to eat. Per home meds list- pt receiving Jevity 1.5 @ 70 ml/hr (unsure if was over 24 hrs?) with Prostat TID. Will start home formula to meet 100% of needs. SLP working with pt at this time. Diet advanced to DYS 1 thin liquids. Per nursing she ~25% of breakfast.   Weight shows to have decreased from 160 lb on 8/2 to 142 lb this admission (11.2% wt loss in 3 months, significant for time frame).   I/O: -760 ml since admit UOP: 950 ml x 24 hrs   Medications: 5 mg reglan QID, miralax Labs: CBG 87-11   NUTRITION - FOCUSED PHYSICAL EXAM:    Most Recent Value  Orbital Region  Mild depletion  Upper Arm Region  Mild depletion  Thoracic and Lumbar Region  Unable to assess  Buccal Region  No depletion   Temple Region  Mild depletion  Clavicle Bone Region  Mild depletion  Clavicle and Acromion Bone Region  Moderate depletion  Scapular Bone Region  Unable to assess  Dorsal Hand  Mild depletion  Patellar Region  Moderate depletion  Anterior Thigh Region  Moderate depletion  Posterior Calf Region  Moderate depletion  Edema (RD Assessment)  Mild  Hair  Reviewed  Eyes  Reviewed  Mouth  Reviewed  Skin  Reviewed  Nails  Reviewed     Diet Order:   Diet Order            DIET - DYS 1 Room service appropriate? No; Fluid consistency: Thin  Diet effective now              EDUCATION NEEDS:   Not appropriate for education at this time  Skin:  Skin Assessment: Reviewed RN Assessment  Last BM:  11/24  Height:   Ht Readings from Last 1 Encounters:  01/24/19 5\' 2"  (1.575 m)    Weight:   Wt Readings from Last 1 Encounters:  01/24/19 65.4 kg    Ideal Body Weight:  50 kg  BMI:  Body mass index is 26.37 kg/m.  Estimated Nutritional Needs:   Kcal:  2050-2250 kcal  Protein:  105-120 grams  Fluid:  >/= 2 L/day   Mariana Single RD, LDN Clinical Nutrition Pager # - [phone]

## 2019-01-25 ENCOUNTER — Inpatient Hospital Stay (HOSPITAL_COMMUNITY): Payer: Medicaid Other

## 2019-01-25 ENCOUNTER — Inpatient Hospital Stay (HOSPITAL_COMMUNITY): Payer: Medicaid Other | Admitting: Anesthesiology

## 2019-01-25 ENCOUNTER — Encounter (HOSPITAL_COMMUNITY): Admission: AD | Disposition: E | Payer: Self-pay | Source: Other Acute Inpatient Hospital | Attending: Family Medicine

## 2019-01-25 ENCOUNTER — Encounter (HOSPITAL_COMMUNITY): Payer: Self-pay | Admitting: Cardiovascular Disease

## 2019-01-25 ENCOUNTER — Inpatient Hospital Stay: Payer: Self-pay

## 2019-01-25 DIAGNOSIS — I38 Endocarditis, valve unspecified: Secondary | ICD-10-CM | POA: Diagnosis not present

## 2019-01-25 DIAGNOSIS — E440 Moderate protein-calorie malnutrition: Secondary | ICD-10-CM | POA: Insufficient documentation

## 2019-01-25 DIAGNOSIS — I350 Nonrheumatic aortic (valve) stenosis: Secondary | ICD-10-CM

## 2019-01-25 HISTORY — PX: TEE WITHOUT CARDIOVERSION: SHX5443

## 2019-01-25 LAB — MAGNESIUM
Magnesium: 1.8 mg/dL (ref 1.7–2.4)
Magnesium: 1.8 mg/dL (ref 1.7–2.4)

## 2019-01-25 LAB — GLUCOSE, CAPILLARY
Glucose-Capillary: 89 mg/dL (ref 70–99)
Glucose-Capillary: 85 mg/dL (ref 70–99)
Glucose-Capillary: 85 mg/dL (ref 70–99)
Glucose-Capillary: 88 mg/dL (ref 70–99)
Glucose-Capillary: 111 mg/dL — ABNORMAL HIGH (ref 70–99)

## 2019-01-25 LAB — PHOSPHORUS
Phosphorus: 4.2 mg/dL (ref 2.5–4.6)
Phosphorus: 4.5 mg/dL (ref 2.5–4.6)

## 2019-01-25 LAB — COMPREHENSIVE METABOLIC PANEL
ALT: 38 U/L (ref 0–44)
AST: 52 U/L — ABNORMAL HIGH (ref 15–41)
Albumin: 1.8 g/dL — ABNORMAL LOW (ref 3.5–5.0)
Alkaline Phosphatase: 291 U/L — ABNORMAL HIGH (ref 38–126)
Anion gap: 11 (ref 5–15)
BUN: 31 mg/dL — ABNORMAL HIGH (ref 6–20)
CO2: 23 mmol/L (ref 22–32)
Calcium: 8.5 mg/dL — ABNORMAL LOW (ref 8.9–10.3)
Chloride: 105 mmol/L (ref 98–111)
Creatinine, Ser: 1.24 mg/dL — ABNORMAL HIGH (ref 0.44–1.00)
GFR calc Af Amer: 56 mL/min — ABNORMAL LOW (ref >60)
GFR calc non Af Amer: 48 mL/min — ABNORMAL LOW (ref >60)
Glucose, Bld: 97 mg/dL (ref 70–99)
Potassium: 3.4 mmol/L — ABNORMAL LOW (ref 3.5–5.1)
Sodium: 139 mmol/L (ref 135–145)
Total Bilirubin: 0.3 mg/dL (ref 0.3–1.2)
Total Protein: 4.7 g/dL — ABNORMAL LOW (ref 6.5–8.1)

## 2019-01-25 SURGERY — ECHOCARDIOGRAM, TRANSESOPHAGEAL
Anesthesia: Monitor Anesthesia Care

## 2019-01-25 MED ORDER — SODIUM CHLORIDE 0.9 % IV SOLN
2.0000 g | Freq: Four times a day (QID) | INTRAVENOUS | Status: DC
  Administered 2019-01-25 – 2019-02-01 (×29): 2 g via INTRAVENOUS
  Filled 2019-01-25 (×5): qty 2
  Filled 2019-01-25: qty 2000
  Filled 2019-01-25 (×4): qty 2
  Filled 2019-01-25: qty 2000
  Filled 2019-01-25 (×7): qty 2
  Filled 2019-01-25: qty 2000
  Filled 2019-01-25 (×9): qty 2
  Filled 2019-01-25: qty 2000
  Filled 2019-01-25: qty 2

## 2019-01-25 MED ORDER — LIDOCAINE 2% (20 MG/ML) 5 ML SYRINGE
INTRAMUSCULAR | Status: DC | PRN
  Administered 2019-01-25: 60 mg via INTRAVENOUS

## 2019-01-25 MED ORDER — OSMOLITE 1.5 CAL PO LIQD
1000.0000 mL | ORAL | Status: DC
  Administered 2019-01-25 – 2019-02-03 (×9): 1000 mL
  Filled 2019-01-25 (×15): qty 1000

## 2019-01-25 MED ORDER — PHENYLEPHRINE HCL-NACL 10-0.9 MG/250ML-% IV SOLN
INTRAVENOUS | Status: DC | PRN
  Administered 2019-01-25: 25 ug/min via INTRAVENOUS

## 2019-01-25 MED ORDER — PHENYLEPHRINE 40 MCG/ML (10ML) SYRINGE FOR IV PUSH (FOR BLOOD PRESSURE SUPPORT)
PREFILLED_SYRINGE | INTRAVENOUS | Status: DC | PRN
  Administered 2019-01-25 (×2): 80 ug via INTRAVENOUS
  Administered 2019-01-25: 40 ug via INTRAVENOUS

## 2019-01-25 MED ORDER — SODIUM CHLORIDE 0.9 % IV SOLN
INTRAVENOUS | Status: AC | PRN
  Administered 2019-01-25: 500 mL via INTRAVENOUS
  Administered 2019-01-25: 13:00:00 via INTRAVENOUS

## 2019-01-25 MED ORDER — PRO-STAT SUGAR FREE PO LIQD
30.0000 mL | Freq: Three times a day (TID) | ORAL | Status: DC
  Administered 2019-01-25 – 2019-02-04 (×23): 30 mL
  Filled 2019-01-25 (×22): qty 30

## 2019-01-25 MED ORDER — PROPOFOL 10 MG/ML IV BOLUS
INTRAVENOUS | Status: DC | PRN
  Administered 2019-01-25 (×2): 50 mg via INTRAVENOUS

## 2019-01-25 MED ORDER — PROPOFOL 500 MG/50ML IV EMUL
INTRAVENOUS | Status: DC | PRN
  Administered 2019-01-25: 50 ug/kg/min via INTRAVENOUS

## 2019-01-25 NOTE — Progress Notes (Signed)
  Echocardiogram Echocardiogram Transesophageal has been performed.  Audrey Collier 01/09/2019, 1:39 PM

## 2019-01-25 NOTE — Progress Notes (Signed)
Nutrition Follow up  DOCUMENTATION CODES:   Non-severe (moderate) malnutrition in context of acute illness/injury  INTERVENTION:   Tube feeding:  -Osmolite 1.5 @ 20 ml/hr via PEG  -Increase by 10 ml Q4 hours to goal rate of 50 ml/hr (1200 ml) -30 ml Prostat TID -Free water flushes 200 ml Q6 hours   At goal TF provides: 2100 kcals, 120 grams protein, 914 ml free water (1714 ml with flushes). Meets 100% of needs.   NUTRITION DIAGNOSIS:   Moderate Malnutrition related to acute illness(s/p AVR with complications) as evidenced by mild fat depletion, moderate fat depletion, percent weight loss.  Ongoing  GOAL:   Patient will meet greater than or equal to 90% of their needs   Addressed   MONITOR:   PO intake, Supplement acceptance, Diet advancement, Weight trends, Labs, I & O's, Skin  REASON FOR ASSESSMENT:   Consult Enteral/tube feeding initiation and management  ASSESSMENT:   Patient with PMH significant for developmental delay, GERD, recent MSSA bacteremia aortic valve vegetation s/p AVR, and s/p PEG due to poor oral intake. Presents this admission with enterococcus bacteremia with UTI.   TF not started yesterday. Nurse unable to find pump.   Spoke with [facility] nurse via phone. Once pt arrived in October she continued on Osmolite 1.5. Eventually she was weaned off continuous due to increase in PO intake. Over the last week continuous feeding of Jevity 1.5 @ 70 ml/hr was started due significant decline in Po intake. Nurse states she did not tolerate this formula/rate.   Will start pt back on Osmolite 1.5 as it's lower in fiber. Meal completions charted as 0-25% for her last 4 meals. Would not recommend weaning TF until pt can meet 75% of needs PO consistently.   Admission weight: 64.6 kg  Current weight: 65.4 kg   I/O: -582 ml since admit UOP: 1,300 ml x 24 hrs   Drips: abx Medications: reglan QID, miralax Labs: K 3.4 (L) CBG 85-121   Diet Order:   Diet Order             DIET - DYS 1 Room service appropriate? No; Fluid consistency: Thin  Diet effective now              EDUCATION NEEDS:   Not appropriate for education at this time  Skin:  Skin Assessment: Reviewed RN Assessment  Last BM:  11/25  Height:   Ht Readings from Last 1 Encounters:  02/10/2019 5\' 2"  (1.575 m)    Weight:   Wt Readings from Last 1 Encounters:  2019-02-10 65.4 kg    Ideal Body Weight:  50 kg  BMI:  Body mass index is 26.37 kg/m.  Estimated Nutritional Needs:   Kcal:  2050-2250 kcal  Protein:  105-120 grams  Fluid:  >/= 2 L/day   Mariana Single RD, LDN Clinical Nutrition Pager # - [phone]

## 2019-01-25 NOTE — Progress Notes (Signed)
Hospitalist progress note   Audrey Collier [id number] DOB: 1960-03-09 DOA: 01/07/2019  PCP: Jani Gravel, MD   Narrative:  58 year old morbidly obese white lady Mild cognitive deficit Bipolar Postmenopausal bleeding status post work-up with endometrial ablation-pathology benign secretory changes 2016 Intractable constipation followed [facility] gastroenterology [facility]-reflux Recent cataract surgery 09/2017 right eye Hospitalized 10/01/18-->12/06/2018 sepsis, MSSA bacteremia in addition to large aortic valve vegetation-urgent aortic valve replacement complicated by respiratory failure renal failure, cholecystitis with elevated LFTs and nausea vomiting  Reviewed [facility] [place] reports-appears hospitalized X3 there -patient had pneumonia and was made DNR went back to [facility] came back in the same way she was weaned to room air speech cleared her for supplemental PEG feeds with oral mechanical soft diet she was diagnosed at that time with a tiny pulmonary embolus and Eliquis was started and BNP was elevated at 2800 Initially started on Rocephin and azithromycin cardiology was consulted because all 4 blood cultures grew Enterococcus and she was on vancomycin urine culture grew ESBL she was placed on meropenem TTE did show mitral valve endocarditis with a very high gradient and they recommended transfer back to [facility] for cardiology opinion She was also quite anemic and may have had a transfusion reaction Monday to try to give blood  [facility] with acute confusional state fever blood cultures growing Enterococcus in all 4 bottles UA showed ESBL UTI 2D echo showed Was started on vancomycin meropenem repeat cultures have been ordered   Assessment & Plan: Enterococcal bacteremia rule out endocarditis-cardiology and ID seeing 2d echo shows Veg on MVL-planning for TEE when available per cardiology Abx narrowed to Ampicillin and  Ceftriaxone--defer duration to ID--likely 6 weeks Still awaiting CVTS input Continue metoprolol 25 twice daily  ESBL UTI Merrem changed to Fosfomycin x 1 and stopped per ID-no further management at this stage  Normocytic anemia hemoglobin 7.1 transfused 1 U without complication 90/24 Hemoglobin stable in the 8 range-labs in am  Small pulmonary embolism 12/2018 We will start Eliquis as renal function is poor cannot use Xarelto  Thrombocytopenia likely secondary to sepsis-seems to be resolving and counts are stable above 100-repeat labs in a.m.  acute component/AKI superimposed upon stage IIIb CKD continue free water in tube increased to every 6 hours as opposed to 8-repeat labs in a.m.  PEG tube 3 episodes of pneumonia-need to discuss with family implications Previously was DNR Add Protonix 40 daily  Depression continue Prozac 1 tab daily hyperlipidemia continue Crestor 5 daily  Hypothyroidism continue Synthroid 125   At bedside aunt  updated =: Eliquis Full code confirmed Unclear disposition at this time  Subjective:  Quiet no concerns nursing reporting eating 25 to 50% of meals no chest pain no fever PEG feeds were not started but family told me yesterday that she has been weaned off of them so we will not start them Going for TEE later today so is n.p.o. for the   Consultants:   Infectious disease  Cardiology Procedures:   None yet Antimicrobials:   Meropenem and vancomycin narrowed to ampicillin ceftriaxone 11/24 Data Reviewed:  Labs BUN/creatinine 36/2.0 up from 64/1.8--->30/1.44-->31/1.2 alk phos 291 Increasing vascular congestion on CXR 11/23 Radiology Studies:   Objective: Vitals:   01/13/2019 0331 01/08/2019 0400 01/17/2019 0600 01/19/2019 0800  BP: 106/66 (!) 101/58 97/62 102/63  Pulse: 81 84 78 77  Resp: (!) 25 16 (!) 21 18  Temp: 98.6 F (37 C)   98.6 F (37  C)  TempSrc: Oral   Oral  SpO2: 96% 99% 98% 98%  Weight:      Height:         Intake/Output Summary (Last 24 hours) at 01/13/2019 0955 Last data filed at 01/24/2019 0842 Gross per 24 hour  Intake 1183.08 ml  Output 1625 ml  Net -441.92 ml   Filed Weights   01/18/2019 0133 01/24/19 0406  Weight: 64.6 kg 65.4 kg    Examination: EOMI NCAT no focal deficit disheveled H4-H8 holosystolic murmur Abdomen soft no rebound no guarding no focal deficit No lower extremity edema Follows commands  Scheduled Meds: . apixaban  5 mg Oral BID  . Chlorhexidine Gluconate Cloth  6 each Topical Daily  . feeding supplement (PRO-STAT SUGAR FREE 64)  30 mL Per Tube TID  . FLUoxetine  10 mg Per Tube QHS  . free water  200 mL Per Tube Q6H  . levothyroxine  150 mcg Per Tube Q0600  . mouth rinse  15 mL Mouth Rinse BID  . metoCLOPramide  10 mg Per Tube TID AC & HS  . metoprolol tartrate  25 mg Per Tube BID  . pantoprazole sodium  40 mg Per Tube Daily  . polyethylene glycol  17 g Per Tube Q M,W,F  . rosuvastatin  5 mg Per Tube Daily  . tamsulosin  0.4 mg Oral Daily   Continuous Infusions: . ampicillin (OMNIPEN) IV    . cefTRIAXone (ROCEPHIN)  IV 2 g (01/24/19 2145)  . feeding supplement (JEVITY 1.5 CAL/FIBER)       LOS: 2 days   Time spent: [place], MD [facility]  01/01/2019, 9:55 AM

## 2019-01-25 NOTE — Progress Notes (Signed)
PHARMACY NOTE:  ANTIMICROBIAL RENAL DOSAGE ADJUSTMENT  Current antimicrobial regimen includes a mismatch between antimicrobial dosage and estimated renal function.  As per policy approved by the Pharmacy & Therapeutics and Medical Executive Committees, the antimicrobial dosage will be adjusted accordingly.  Current antimicrobial dosage:  Ampicillin 2 gm Q 8 hours  Indication: Enterococcal endocarditis   Renal Function:  Estimated Creatinine Clearance: 38.2 mL/min (A) (by C-G formula based on SCr of 1.44 mg/dL (H)). []      On intermittent HD, scheduled: []      On CRRT    Antimicrobial dosage has been changed to:  Ampicillin 2 gm Q 6 hours   Additional comments:   Thank you for allowing pharmacy to be a part of this patient's care.  Jimmy Footman, PharmD, BCPS, [place] Infectious Diseases Clinical Pharmacist Phone: [phone] 2019-01-26 8:01 AM

## 2019-01-25 NOTE — Anesthesia Preprocedure Evaluation (Addendum)
Anesthesia Evaluation  Patient identified by MRN, date of birth, ID band Patient awake    Reviewed: Allergy & Precautions, NPO status , Patient's Chart, lab work & pertinent test results  History of Anesthesia Complications Negative for: history of anesthetic complications  Airway Mallampati: I  TM Distance: >3 FB Neck ROM: Full    Dental no notable dental hx. (+) Dental Advisory Given   Pulmonary neg pulmonary ROS,    Pulmonary exam normal        Cardiovascular +CHF  Normal cardiovascular exam+ dysrhythmias Atrial Fibrillation + Valvular Problems/Murmurs AS and MR    1. Highly suggestive of multiple valve endocarditis. Mitral valve with moderate MR, visible vegetation on anterior leaflet not previously seen, and images suggest involvement of posterior leaflet as well. Bioprosthetic aortc valve leaflets appear  thickened, restricted, with severe AS by gradients. There may also be a vegetation on the ventricular aspect of the aortic valve. There is a linear echodensity in RVOT, cannot see where it attaches, cannot exclude pulmonic valve endocarditis. Recommend  TEE for further evaluation.  2. Left ventricular ejection fraction, by visual estimation, is 50 to 55%. The left ventricle has low normal function. There is mildly increased left ventricular hypertrophy.  3. Left ventricular diastolic parameters are consistent with Grade II diastolic dysfunction (pseudonormalization).  4. The left ventricle has no regional wall motion abnormalities.  5. Global right ventricle has mildly reduced systolic function.The right ventricular size is normal. Right vetricular wall thickness was not assessed.  6. Left atrial size was severely dilated.  7. Right atrial size was mildly dilated.  8. Small pericardial effusion.  9. Moderate vegetation on the mitral valve. 10. The mitral valve is abnormal. Moderate mitral valve regurgitation. 11. New mobile  echodensity on anterior mitral valve leaflet, on atrial aspect of leaflet. Prior TEE images reviewed, not previously visualized. On image 14, there also may be additional involvement of posterior leaflet. 12. The tricuspid valve is grossly normal. Tricuspid valve regurgitation moderate-severe. 13. Aortic valve regurgitation is trivial. Severe aortic valve stenosis. 68. 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 There appears to be an echodensity seen (see image 11) on ventricular side of valve. No significant regurgitation. Leaflets appear thickened and do not open fully. Increased valve gradient,  mean gradient 43 mmHg. Recommend further evaluation with TEE. 15. The pulmonic valve was grossly normal. Pulmonic valve regurgitation is mild to moderate. 16. There is a linear echodenisity seen in the RVOT, cannot clearly see to where it attaches. Cannot exclude pulmonary valve endocarditis. 17. The aortic root was not well visualized. 18. Moderately elevated pulmonary artery systolic pressure. 19. The tricuspid regurgitant velocity is 3.23 m/s, and with an assumed right atrial pressure of 8 mmHg, the estimated right ventricular systolic pressure is moderately elevated at 49.6 mmHg. 20. The inferior vena cava is normal in size with <50% respiratory variability, suggesting right atrial pressure of 8 mmHg. 21. The interatrial septum was not well visualized.   Neuro/Psych PSYCHIATRIC DISORDERS Anxiety negative neurological ROS     GI/Hepatic Neg liver ROS, GERD  ,Gastrostomy   Endo/Other  Hypothyroidism   Renal/GU Renal InsufficiencyRenal disease     Musculoskeletal negative musculoskeletal ROS (+)   Abdominal   Peds  Hematology negative hematology ROS (+) anemia ,   Anesthesia Other Findings Day of surgery medications reviewed with the patient.  Reproductive/Obstetrics  Anesthesia Physical Anesthesia Plan  ASA:  IV  Anesthesia Plan: MAC   Post-op Pain Management:    Induction:   PONV Risk Score and Plan: 2 and Treatment may vary due to age or medical condition, Propofol infusion and Ondansetron  Airway Management Planned: Mask  Additional Equipment:   Intra-op Plan:   Post-operative Plan:   Informed Consent: I have reviewed the patients History and Physical, chart, labs and discussed the procedure including the risks, benefits and alternatives for the proposed anesthesia with the patient or authorized representative who has indicated his/her understanding and acceptance.     Consent reviewed with POA  Plan Discussed with: Anesthesiologist, CRNA and Surgeon  Anesthesia Plan Comments:      Anesthesia Quick Evaluation

## 2019-01-25 NOTE — Progress Notes (Signed)
SLP Consult Note  Patient Details Name: Audrey Collier MRN: [id number] DOB: [birth year]   Consulted with RN regarding pt tolerance of Dys1 (puree) diet and thin liquids. Per RN, po intake has been poor, but pt appears to tolerate current textures. Unable to provide po trials at this time, as pt is currently NPO for TEE. Will continue current diet and follow to assess readiness to advance solids.   Carmela Rima, [place] Speech Language Pathologist Office: [phone]  Shonna Chock 01/24/2019, 10:35 AM

## 2019-01-25 NOTE — Progress Notes (Addendum)
[facility]    Date of Admission:  02-13-19   Total days of antibiotics 3 amp/ctx           ID: Audrey Collier is a 58 y.o. 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  She underwent TEE which showed 1.3 cm x 0.9 cm mobile density on the aortic side of the bioprosthetic aortic valve consistent with vegetation. ( this is inaddition to MV native vegetation)  Medications:  . apixaban  5 mg Oral BID  . Chlorhexidine Gluconate Cloth  6 each Topical Daily  . FLUoxetine  10 mg Per Tube QHS  . free water  200 mL Per Tube Q6H  . levothyroxine  150 mcg Per Tube Q0600  . mouth rinse  15 mL Mouth Rinse BID  . metoCLOPramide  10 mg Per Tube TID AC & HS  . metoprolol tartrate  25 mg Per Tube BID  . pantoprazole sodium  40 mg Per Tube Daily  . polyethylene glycol  17 g Per Tube Q M,W,F  . rosuvastatin  5 mg Per Tube Daily  . tamsulosin  0.4 mg Oral Daily    Objective: Vital signs in last 24 hours: Temp:  [98.3 F (36.8 C)-100 F (37.8 C)] 98.6 F (37 C) (11/25 0800) Pulse Rate:  [74-91] 77 (11/25 0800) Resp:  [16-29] 18 (11/25 0800) BP: (97-118)/(58-75) 102/63 (11/25 0800) SpO2:  [90 %-100 %] 98 % (11/25 0800)  Physical Exam  Constitutional:  oriented to person, place, and time. appears well-developed and well-nourished. No distress.  HENT: [place]/AT, PERRLA, no scleral icterus Mouth/Throat: Oropharynx is clear and moist. No oropharyngeal exudate.  Cardiovascular: Normal rate, regular rhythm and normal heart sounds. +murmur Pulmonary/Chest: Effort normal and breath sounds normal. No respiratory distress.  has no wheezes.  Neck = supple, no nuchal rigidity Abdominal: Soft. Bowel sounds are  normal.  exhibits no distension. There is no tenderness.  Lymphadenopathy: no cervical adenopathy. No axillary adenopathy Neurological: alert and oriented to person, place, and time.  Skin: Skin is warm and dry. No rash noted. No erythema.  Psychiatric: a normal mood and affect.  behavior is normal.   Lab Results Recent Labs    13-Feb-2019 0324 2019/02/13 1959 01/24/19 0312 01/01/2019 0521  WBC 17.5*  --  15.3*  --   HGB 7.1* 8.7* 8.8*  --   HCT 22.9* 28.6* 28.0*  --   NA 138  --  141 139  K 3.8  --  3.8 3.4*  CL 105  --  109 105  CO2 24  --  24 23  BUN 36*  --  30* 31*  CREATININE 2.01*  --  1.44* 1.24*   Liver Panel Recent Labs    02/13/2019 0324 01/08/2019 0521  PROT 5.0* 4.7*  ALBUMIN 2.0* 1.8*  AST 38 52*  ALT 24 38  ALKPHOS 166* 291*  BILITOT 0.7 0.3   Sedimentation Rate Recent Labs    2019/02/13 0324  ESRSEDRATE 98*   C-Reactive Protein No results for input(s): CRP in the last 72 hours.  Microbiology: 11/23 blood cx ngtd Studies/Results: No results found.   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  Blood cx NGTD. Will treat for 6 wks. Consider dual lumen for ampicillin and ceftriaxone 2gm IV Q 12. Please make sure CT surgery agrees with medical management since concern that she is not a candidate for redo valve. She is high risk for CNS embolization due to size of vegetation. Please late patient and caregivers know.  Recommend repeat echo at end of treatment  Remove foley catheter to minimize risk of CAUTI  aki = resolved  esbl uti = treated  Will see back on Monday. If questions, can call dr comer available over the weekend.  Will see back in the ID clinic in 4-6 wk  [facility] Cell: [phone] Pager: [phone]  01/16/2019, 10:44 AM

## 2019-01-25 NOTE — Progress Notes (Signed)
Attempt to get phone consent for PICC unsuccessful.  Will attempt again later.

## 2019-01-25 NOTE — Progress Notes (Signed)
Pt received from endo. VSS. Pt placed back on telemetry. Will continue to monitor.  Clyde Canterbury, RN

## 2019-01-25 NOTE — Progress Notes (Signed)
Received order for PICC  

## 2019-01-25 NOTE — CV Procedure (Addendum)
Brief TEE Note  LVEF 55% No LA/LAA thrombus or mass Mobile vegetation on the P3 region of the mitral valve Bioprosthetic aortic valve leaflets thickened without clear vegetation.  Peak velocity and mean gradient are severely increased.  Formal calculations on functional vs pathologic obstruction to follow. Moderate mitral regurgitation. Moderate tricuspid regurgitation. LA severely dilated.  Full report to follow.  Blessing Ozga C. Oval Linsey, MD, [facility] February 03, 2019 1:28 PM   Addendum:  There is a 1.3 cm x 0.9 cm mobile density on the aortic side of the bioprosthetic aortic valve consistent with vegetation.  Sian Rockers C. Oval Linsey, MD, [facility] 2019/02/03 2:16 PM

## 2019-01-25 NOTE — Transfer of Care (Signed)
Immediate Anesthesia Transfer of Care Note  Patient: Audrey Collier  Procedure(s) Performed: TRANSESOPHAGEAL ECHOCARDIOGRAM (TEE) (N/A )  Patient Location: PACU and Endoscopy Unit  Anesthesia Type:MAC  Level of Consciousness: patient cooperative  Airway & Oxygen Therapy: Patient Spontanous Breathing and Patient connected to nasal cannula oxygen  Post-op Assessment: Report given to RN and Post -op Vital signs reviewed and stable  Post vital signs: Reviewed and stable  Last Vitals:  Vitals Value Taken Time  BP 105/64 01/15/2019 1332  Temp    Pulse 81 01/27/2019 1332  Resp 26 01/01/2019 1332  SpO2 97 % 01/13/2019 1332    Last Pain:  Vitals:   01/21/2019 1332  TempSrc:   PainSc: 0-No pain         Complications: No apparent anesthesia complications

## 2019-01-25 NOTE — H&P (Signed)
Audrey Collier is a 58 y.o. female who has presented today for surgery, with the diagnosis of endocarditis.  The various methods of treatment have been discussed with the patient and family. After consideration of risks, benefits and other options for treatment, the patient has consented to  Procedure(s): TRANSESOPHAGEAL ECHOCARDIOGRAM (TEE) (N/A) as a surgical intervention .  The patient's history has been reviewed, patient examined, no change in status, stable for surgery.  I have reviewed the patient's chart and labs.  Questions were answered to the patient's satisfaction.    Kloie Whiting C. Oval Linsey, MD, [facility]  01/29/2019 12:59 PM

## 2019-01-26 ENCOUNTER — Encounter (HOSPITAL_COMMUNITY): Payer: Self-pay | Admitting: Cardiovascular Disease

## 2019-01-26 DIAGNOSIS — B952 Enterococcus as the cause of diseases classified elsewhere: Secondary | ICD-10-CM | POA: Diagnosis not present

## 2019-01-26 DIAGNOSIS — R7881 Bacteremia: Secondary | ICD-10-CM | POA: Diagnosis not present

## 2019-01-26 LAB — RENAL FUNCTION PANEL
Albumin: 1.8 g/dL — ABNORMAL LOW (ref 3.5–5.0)
Anion gap: 10 (ref 5–15)
BUN: 33 mg/dL — ABNORMAL HIGH (ref 6–20)
CO2: 24 mmol/L (ref 22–32)
Calcium: 8.6 mg/dL — ABNORMAL LOW (ref 8.9–10.3)
Chloride: 109 mmol/L (ref 98–111)
Creatinine, Ser: 1.16 mg/dL — ABNORMAL HIGH (ref 0.44–1.00)
GFR calc Af Amer: >60 mL/min (ref >60)
GFR calc non Af Amer: 52 mL/min — ABNORMAL LOW (ref >60)
Glucose, Bld: 123 mg/dL — ABNORMAL HIGH (ref 70–99)
Phosphorus: 4.5 mg/dL (ref 2.5–4.6)
Potassium: 3.4 mmol/L — ABNORMAL LOW (ref 3.5–5.1)
Sodium: 143 mmol/L (ref 135–145)

## 2019-01-26 LAB — GLUCOSE, CAPILLARY
Glucose-Capillary: 114 mg/dL — ABNORMAL HIGH (ref 70–99)
Glucose-Capillary: 115 mg/dL — ABNORMAL HIGH (ref 70–99)
Glucose-Capillary: 124 mg/dL — ABNORMAL HIGH (ref 70–99)
Glucose-Capillary: 129 mg/dL — ABNORMAL HIGH (ref 70–99)
Glucose-Capillary: 133 mg/dL — ABNORMAL HIGH (ref 70–99)
Glucose-Capillary: 99 mg/dL (ref 70–99)

## 2019-01-26 MED ORDER — SODIUM CHLORIDE 0.9% FLUSH
10.0000 mL | INTRAVENOUS | Status: DC | PRN
  Administered 2019-01-31: 10 mL
  Filled 2019-01-26: qty 40

## 2019-01-26 NOTE — Progress Notes (Signed)
     [address]       [place] [address]             [phone]       Ms. Shackleford is well-known to our service, and she underwent an aortic valve replacement for an MSSA endocarditis in August of this year.  She had a prolonged hospitalization mostly due to chronic debility from her developmental delays and social factors.  At that last hospitalization her family actually elected to make her a DNR.  She comes back with an Enterobacter endocarditis involving prosthetic aortic valve, and mitral valve.  What is surprising on her imaging is the fact that she has developed thickened aortic valve leaflets as well as a new vegetation aortic side.  It is unlikely that she has developed a rapid degeneration of her valve and the appearance is likely more consistent with thrombus and vegetation on the aortic valve leaflets.    Given her recent history of aortic valve replacement and the difficulties with her recovery, I do not think that she would be an ideal candidate for a reoperation.  This would likely require a mitral valve replacement, and likely an aortic root replacement.  I would recommend that she be treated with antibiotics and if there is a question of prosthetic valve leaflet thrombus, a cardiac CT would be helpful in delineating this.

## 2019-01-26 NOTE — Progress Notes (Signed)
Progress Note  Patient Name: Audrey Collier Date of Encounter: 01/26/2019  Primary Cardiologist: Fransico Him, MD   Subjective   Denies chest pain or sob. Does not say much. ?expressive dysphasia  Inpatient Medications    Scheduled Meds: . apixaban  5 mg Oral BID  . Chlorhexidine Gluconate Cloth  6 each Topical Daily  . feeding supplement (PRO-STAT SUGAR FREE 64)  30 mL Per Tube TID  . FLUoxetine  10 mg Per Tube QHS  . free water  200 mL Per Tube Q6H  . levothyroxine  150 mcg Per Tube Q0600  . mouth rinse  15 mL Mouth Rinse BID  . metoCLOPramide  10 mg Per Tube TID AC & HS  . metoprolol tartrate  25 mg Per Tube BID  . pantoprazole sodium  40 mg Per Tube Daily  . polyethylene glycol  17 g Per Tube Q M,W,F  . rosuvastatin  5 mg Per Tube Daily  . tamsulosin  0.4 mg Oral Daily   Continuous Infusions: . ampicillin (OMNIPEN) IV Stopped (01/26/19 0527)  . cefTRIAXone (ROCEPHIN)  IV Stopped (01/24/2019 2301)  . feeding supplement (OSMOLITE 1.5 CAL) 50 mL/hr at 01/26/19 0630   PRN Meds: acetaminophen **OR** acetaminophen, ondansetron **OR** ondansetron (ZOFRAN) IV   Vital Signs    Vitals:   01/26/19 0500 01/26/19 0510 01/26/19 0600 01/26/19 0832  BP:  (!) 107/59 (!) 97/59 (!) 96/57  Pulse:   90 86  Resp:  (!) 25 20 18   Temp:  98.7 F (37.1 C)  98 F (36.7 C)  TempSrc:  Oral  Oral  SpO2:  95% 93% 96%  Weight: 65.6 kg     Height:        Intake/Output Summary (Last 24 hours) at 01/26/2019 5188 Last data filed at 01/26/2019 0600 Gross per 24 hour  Intake 1717.58 ml  Output 1125 ml  Net 592.58 ml   Filed Weights   01/24/19 0406 01/12/2019 1117 01/26/19 0500  Weight: 65.4 kg 65.4 kg 65.6 kg    Telemetry    nsr - Personally Reviewed  ECG    none - Personally Reviewed  Physical Exam   GEN: No acute distress.   Neck: No JVD Cardiac: RRR, 2/6 systolic murmur at the RUSB and at the base.  Respiratory: Clear to auscultation bilaterally. GI: Soft, nontender,  non-distended  MS: No edema; No deformity. Neuro:  Nonfocal but not very verbal. Psych: flat affect   Labs    Chemistry Recent Labs  Lab 01/01/2019 0324 01/24/19 0312 01/08/2019 0521 01/26/19 0252  NA 138 141 139 143  K 3.8 3.8 3.4* 3.4*  CL 105 109 105 109  CO2 24 24 23 24   GLUCOSE 114* 93 97 123*  BUN 36* 30* 31* 33*  CREATININE 2.01* 1.44* 1.24* 1.16*  CALCIUM 8.7* 8.9 8.5* 8.6*  PROT 5.0*  --  4.7*  --   ALBUMIN 2.0*  --  1.8* 1.8*  AST 38  --  52*  --   ALT 24  --  38  --   ALKPHOS 166*  --  291*  --   BILITOT 0.7  --  0.3  --   GFRNONAA 27* 40* 48* 52*  GFRAA 31* 47* 56* >60  ANIONGAP 9 8 11 10      Hematology Recent Labs  Lab 01/13/2019 0324 01/22/2019 1959 01/24/19 0312  WBC 17.5*  --  15.3*  RBC 2.56*  2.56*  --  3.31*  HGB 7.1* 8.7* 8.8*  HCT  22.9* 28.6* 28.0*  MCV 89.5  --  84.6  MCH 27.7  --  26.6  MCHC 31.0  --  31.4  RDW 16.5*  --  21.2*  PLT 148*  --  116*    Cardiac EnzymesNo results for input(s): TROPONINI in the last 168 hours. No results for input(s): TROPIPOC in the last 168 hours.   BNPNo results for input(s): BNP, PROBNP in the last 168 hours.   DDimer No results for input(s): DDIMER in the last 168 hours.   Radiology    [place] Ekg Site Rite  Result Date: 30-Jan-2019 If [facility] image not attached, placement could not be confirmed due to current cardiac rhythm.   Cardiac Studies   TEE - reviewed  Patient Profile     59 y.o. female admitted with enterococcal bacteremia and found to have mital and prosthetic aortic valve endocarditis.  Assessment & Plan    1. Mitral and aortic valve endocarditis - she has not yet had any mechanical complications. She will continue IV anti-biotics. 2. AS - her gradients are high. Hopefully this will improve with anti-biotics. Her vegetabion was measured as 1.3 x 0.9.  3. MR - loud murmur on exam. Her mitral vegetation is fairly small and mobile. Her MR is described as moderate.  4. Acute renal  failure - this has improved markedly.  5. Disp. - at this point, long term anti-biotics. Her prognosis is poor but at least hopeful.      For questions or updates, please contact [facility] Please consult [email or web address] for contact info under Cardiology/STEMI.      Signed, Lewayne Bunting, MD  01/26/2019, 9:21 AM  Patient ID: Audrey Collier, female   DOB: 03/11/1960, 58 y.o.   MRN: [id number]

## 2019-01-26 NOTE — Progress Notes (Signed)
Hospitalist progress note   Audrey Collier [id number] DOB: November 16, 1960 DOA: 02/19/19  PCP: Pearson Grippe, MD   Narrative:  58 year old morbidly obese white lady Mild cognitive deficit Bipolar Postmenopausal bleeding status post work-up with endometrial ablation-pathology benign secretory changes 2016 Intractable constipation followed by gastroenterology [facility]-reflux Recent cataract surgery 09/2017 right eye Hospitalized 10/01/18-->12/06/2018 sepsis, MSSA bacteremia in addition to large aortic valve vegetation-urgent aortic valve replacement complicated by respiratory failure renal failure, cholecystitis with elevated LFTs and nausea vomiting  Reviewed [facility] [place] reports-appears hospitalized X3 there -patient had pneumonia and was made DNR went back to [facility] came back in the same way she was weaned to room air speech cleared her for supplemental PEG feeds with oral mechanical soft diet she was diagnosed at that time with a tiny pulmonary embolus and Eliquis was started and BNP was elevated at 2800 Initially started on Rocephin and azithromycin cardiology was consulted because all 4 blood cultures grew Enterococcus and she was on vancomycin urine culture grew ESBL she was placed on meropenem TTE did show mitral valve endocarditis with a very high gradient and they recommended transfer back to [facility] for cardiology opinion She was also quite anemic and may have had a transfusion reaction Monday to try to give blood  [facility] with acute confusional state fever blood cultures growing Enterococcus in all 4 bottles UA showed ESBL UTI 2D echo showed Was started on vancomycin meropenem repeat cultures have been ordered   Assessment & Plan: Enterococcal bacteremia rule out endocarditis-cardiology and ID seeing 2d echo shows Veg on MVL-TEE shows 1.3 x 0.9 veg on Prosth AoV-Have discussed high risk embolization to brain with  family-they are aware Abx narrowed to Ampicillin and Ceftriaxone--defer duration to ID--likely 6 weeks Still awaiting CVTS input Continue metoprolol 25 twice daily  ESBL UTI Merrem changed to Fosfomycin x 1 and stopped per ID-no further management at this stage  Normocytic anemia hemoglobin 7.1 transfused 1 U without complication 11/23 Hemoglobin stable in the 8 range-labs in am  Small pulmonary embolism 12/2018 We will start Eliquis as renal function is poor cannot use Xarelto  Thrombocytopenia likely secondary to sepsis-seems to be resolving and counts are stable above 100-repeat labs in a.m.  acute component/AKI superimposed upon stage IIIb CKD continue free water in tube increased to every 6 hours as opposed to 8-repeat labs in a.m.  PEG tube 3 episodes of pneumonia-need to discuss with family implications Previously was DNR Add Protonix 40 daily Start peg feeds as poor po intake  Depression continue Prozac 1 tab daily hyperlipidemia continue Crestor 5 daily  Hypothyroidism continue Synthroid 125   At bedside aunt  updated =: Eliquis Full code confirmed Unclear disposition at this time  Subjective:  Eating some  sittign in stool Foley in place No cp no fever poor interactivity   Consultants:   Infectious disease  Cardiology Procedures:   None yet Antimicrobials:   Meropenem and vancomycin narrowed to ampicillin ceftriaxone 11/24 Data Reviewed:  Labs BUN/creatinine 36/2.0 up from 64/1.8--->30/1.44-->31/1.2-->33/1/16 Wbc 17-->15 Hemoglobin 8.8 up from 7 Radiology Studies:   Objective: Vitals:   01/26/19 0500 01/26/19 0510 01/26/19 0600 01/26/19 0832  BP:  (!) 107/59 (!) 97/59 (!) 96/57  Pulse:   90 86  Resp:  (!) 25 20 18   Temp:  98.7 F (37.1 C)  98 F (36.7 C)  TempSrc:  Oral  Oral  SpO2:  95% 93% 96%  Weight: 65.6 kg  Height:        Intake/Output Summary (Last 24 hours) at 01/26/2019 1053 Last data filed at 01/26/2019 0600 Gross per  24 hour  Intake 1717.58 ml  Output 1125 ml  Net 592.58 ml   Filed Weights   01/24/19 0406 01/12/2019 1117 01/26/19 0500  Weight: 65.4 kg 65.4 kg 65.6 kg    Examination: EOMI NCAT no focal deficit disheveled K8-J6 holosystolic murmur Abdomen soft no rebound no guarding no focal deficit Foley in place no deficit Soft stool No lower extremity edema Follows commands  Scheduled Meds: . apixaban  5 mg Oral BID  . Chlorhexidine Gluconate Cloth  6 each Topical Daily  . feeding supplement (PRO-STAT SUGAR FREE 64)  30 mL Per Tube TID  . FLUoxetine  10 mg Per Tube QHS  . free water  200 mL Per Tube Q6H  . levothyroxine  150 mcg Per Tube Q0600  . mouth rinse  15 mL Mouth Rinse BID  . metoCLOPramide  10 mg Per Tube TID AC & HS  . metoprolol tartrate  25 mg Per Tube BID  . pantoprazole sodium  40 mg Per Tube Daily  . polyethylene glycol  17 g Per Tube Q M,W,F  . rosuvastatin  5 mg Per Tube Daily  . tamsulosin  0.4 mg Oral Daily   Continuous Infusions: . ampicillin (OMNIPEN) IV Stopped (01/26/19 0527)  . cefTRIAXone (ROCEPHIN)  IV Stopped (01/29/2019 2301)  . feeding supplement (OSMOLITE 1.5 CAL) 50 mL/hr at 01/26/19 0630     LOS: 3 days   Time spent: Speed, MD [facility]  01/26/2019, 10:53 AM

## 2019-01-26 NOTE — Progress Notes (Signed)
Peripherally Inserted Central Catheter/Midline Placement  The IV Nurse has discussed with the patient and/or persons authorized to consent for the patient, the purpose of this procedure and the potential benefits and risks involved with this procedure.  The benefits include less needle sticks, lab draws from the catheter, and the patient may be discharged home with the catheter. Risks include, but not limited to, infection, bleeding, blood clot (thrombus formation), and puncture of an artery; nerve damage and irregular heartbeat and possibility to perform a PICC exchange if needed/ordered by physician.  Alternatives to this procedure were also discussed.  Bard Power PICC patient education guide, fact sheet on infection prevention and patient information card has been provided to patient /or left at bedside.    PICC/Midline Placement Documentation  PICC Single Lumen 85/46/27 PICC Right Basilic 35 cm 0 cm (Active)  Indication for Insertion or Continuance of Line Home intravenous therapies (PICC only) 01/26/19 0933  Exposed Catheter (cm) 0 cm 01/26/19 0933  Line Status Flushed;Saline locked;Blood return noted 01/26/19 0933  Dressing Type Transparent;Securing device 01/26/19 0933  Dressing Status Clean;Dry;Intact;Antimicrobial disc in place 01/26/19 0933  Dressing Intervention New dressing 01/26/19 0933  Dressing Change Due 02/02/19 01/26/19 0933       Enos Fling 01/26/2019, 9:36 AM

## 2019-01-27 DIAGNOSIS — R7881 Bacteremia: Secondary | ICD-10-CM | POA: Diagnosis not present

## 2019-01-27 DIAGNOSIS — Z95828 Presence of other vascular implants and grafts: Secondary | ICD-10-CM

## 2019-01-27 DIAGNOSIS — D72829 Elevated white blood cell count, unspecified: Secondary | ICD-10-CM | POA: Diagnosis not present

## 2019-01-27 DIAGNOSIS — T826XXA Infection and inflammatory reaction due to cardiac valve prosthesis, initial encounter: Principal | ICD-10-CM

## 2019-01-27 DIAGNOSIS — I330 Acute and subacute infective endocarditis: Secondary | ICD-10-CM

## 2019-01-27 DIAGNOSIS — E440 Moderate protein-calorie malnutrition: Secondary | ICD-10-CM

## 2019-01-27 DIAGNOSIS — N179 Acute kidney failure, unspecified: Secondary | ICD-10-CM | POA: Diagnosis not present

## 2019-01-27 DIAGNOSIS — Z952 Presence of prosthetic heart valve: Secondary | ICD-10-CM

## 2019-01-27 LAB — TYPE AND SCREEN
ABO/RH(D): O POS
Antibody Screen: NEGATIVE
Donor AG Type: NEGATIVE
Donor AG Type: NEGATIVE
Unit division: 0
Unit division: 0

## 2019-01-27 LAB — BPAM RBC
Blood Product Expiration Date: 202012092359
Blood Product Expiration Date: 202012292359
ISSUE DATE / TIME: 202011231142
Unit Type and Rh: 5100
Unit Type and Rh: 5100

## 2019-01-27 LAB — GLUCOSE, CAPILLARY
Glucose-Capillary: 113 mg/dL — ABNORMAL HIGH (ref 70–99)
Glucose-Capillary: 117 mg/dL — ABNORMAL HIGH (ref 70–99)
Glucose-Capillary: 117 mg/dL — ABNORMAL HIGH (ref 70–99)
Glucose-Capillary: 121 mg/dL — ABNORMAL HIGH (ref 70–99)
Glucose-Capillary: 136 mg/dL — ABNORMAL HIGH (ref 70–99)
Glucose-Capillary: 142 mg/dL — ABNORMAL HIGH (ref 70–99)

## 2019-01-27 NOTE — [place] FL2 (Signed)
[place] MEDICAID FL2 LEVEL OF CARE SCREENING TOOL     IDENTIFICATION  Patient Name: Audrey Collier Birthdate: March 02, 1961 Sex: female Admission Date (Current Location): 01/26/2019  [place] and [place] Number:  Haynes Bast [id number] Facility and Address:  [facility], [address], [place], [place] [address]      Provider Number: [id number]  Attending Physician Name and Address:  Narda Bonds, MD  Relative Name and Phone Number:  Enedina, Pair, [phone]    Current Level of Care: Hospital Recommended Level of Care: Skilled Nursing Facility Prior Approval Number:    Date Approved/Denied:   PASRR Number: [id number]  Discharge Plan: SNF    Current Diagnoses: Patient Active Problem List   Diagnosis Date Noted  . Malnutrition of moderate degree 01/01/2019  . Bacteremia due to Enterococcus 01/06/2019  . Bacteriuria, ESBL Klebsiella  01/15/2019  . Nausea & vomiting   . Adult failure to thrive   . Weakness generalized   . Palliative care by specialist   . Pressure injury of skin 10/10/2018  . S/P AVR (aortic valve replacement) 10/07/2018  . Acute respiratory failure with hypoxia ([name])   . Staphylococcus aureus bacteremia with sepsis ([name]) 10/03/2018  . Bacteremia due to methicillin susceptible Staphylococcus aureus (MSSA)   . Aortic valve endocarditis   . Multifocal pneumonia   . Acute hypoxemic respiratory failure ([name])   . Severe sepsis ([name]) 10/01/2018  . GERD (gastroesophageal reflux disease) 03/18/2017  . Loose stools 09/30/2015  . Abdominal pain 11/30/2014  . Helicobacter pylori gastritis 10/24/2014  . PMB (postmenopausal bleeding) 03/26/2014  . Thickened endometrium 03/26/2014  . Endometrial polyp 03/26/2014  . Constipation 04/11/2013  . Fecal occult blood test positive 03/15/2013    Orientation RESPIRATION BLADDER Height & Weight     Self, Place  O2(95, Ashley, 1L) Incontinent Weight: 150 lb 2.1 oz (68.1 kg) Height:   5\' 2"  (157.5 cm)  BEHAVIORAL SYMPTOMS/MOOD NEUROLOGICAL BOWEL NUTRITION STATUS      Incontinent Diet(Dys 1 diet, thin liquids; peg tube osmolite 1.5cal liquid 1,065ml)  AMBULATORY STATUS COMMUNICATION OF NEEDS Skin   Extensive Assist Verbally Other (Comment)(Dry skin, cracking on right heel)                       Personal Care Assistance Level of Assistance  Bathing, Feeding, Dressing Bathing Assistance: Maximum assistance Feeding assistance: Limited assistance Dressing Assistance: Maximum assistance     Functional Limitations Info  Sight, Hearing, Speech Sight Info: Impaired Hearing Info: Adequate Speech Info: Adequate    SPECIAL CARE FACTORS FREQUENCY  PT (By licensed PT), OT (By licensed OT)     PT Frequency: 5x/wk OT Frequency: 5x/wk            Contractures Contractures Info: Not present    Additional Factors Info  Code Status, Allergies Code Status Info: Full Code Allergies Info: No Known Allergies           Current Medications (01/27/2019):  This is the current hospital active medication list Current Facility-Administered Medications  Medication Dose Route Frequency Provider Last Rate Last Dose  . acetaminophen (TYLENOL) tablet 650 mg  650 mg Oral Q6H PRN 01/29/2019, MD       Or  . acetaminophen (TYLENOL) suppository 650 mg  650 mg Rectal Q6H PRN Chilton Si, MD      . ampicillin (OMNIPEN) 2 g in sodium chloride 0.9 % 100 mL IVPB  2 g Intravenous Q6H Chilton Si, MD 300  mL/hr at 01/27/19 1127 2 g at 01/27/19 1127  . apixaban (ELIQUIS) tablet 5 mg  5 mg Oral BID Skeet Latch, MD   5 mg at 01/27/19 0817  . cefTRIAXone (ROCEPHIN) 2 g in sodium chloride 0.9 % 100 mL IVPB  2 g Intravenous Q12H Skeet Latch, MD 200 mL/hr at 01/27/19 1027 2 g at 01/27/19 1027  . Chlorhexidine Gluconate Cloth 2 % PADS 6 each  6 each Topical Daily Skeet Latch, MD   6 each at 01/27/19 0818  . feeding supplement (OSMOLITE 1.5 CAL) liquid 1,000 mL   1,000 mL Per Tube Continuous Nita Sells, MD 50 mL/hr at 01/26/19 2243 1,000 mL at 01/26/19 2243  . feeding supplement (PRO-STAT SUGAR FREE 64) liquid 30 mL  30 mL Per Tube TID Nita Sells, MD   30 mL at 01/27/19 0818  . FLUoxetine (PROZAC) capsule 10 mg  10 mg Per Tube QHS Skeet Latch, MD   10 mg at 01/26/19 2230  . free water 200 mL  200 mL Per Tube Q6H Skeet Latch, MD   200 mL at 01/27/19 1128  . levothyroxine (SYNTHROID) tablet 150 mcg  150 mcg Per Tube [id number] Skeet Latch, MD   150 mcg at 01/27/19 0552  . MEDLINE mouth rinse  15 mL Mouth Rinse BID Skeet Latch, MD   15 mL at 01/26/19 2243  . metoCLOPramide (REGLAN) 5 MG/5ML solution 10 mg  10 mg Per Tube TID AC & HS Skeet Latch, MD   10 mg at 01/27/19 1128  . metoprolol tartrate (LOPRESSOR) 25 mg/10 mL oral suspension 25 mg  25 mg Per Tube BID Skeet Latch, MD   25 mg at 01/27/19 1020  . ondansetron (ZOFRAN) tablet 4 mg  4 mg Oral Q6H PRN Skeet Latch, MD       Or  . ondansetron [facility]) injection 4 mg  4 mg Intravenous Q6H PRN Skeet Latch, MD   4 mg at 01/27/19 1013  . pantoprazole sodium (PROTONIX) 40 mg/20 mL oral suspension 40 mg  40 mg Per Tube Daily Skeet Latch, MD   40 mg at 01/27/19 1013  . polyethylene glycol (MIRALAX / GLYCOLAX) packet 17 g  17 g Per Tube Q M,W,F Skeet Latch, MD   17 g at 01/04/2019 1028  . rosuvastatin (CRESTOR) tablet 5 mg  5 mg Per Tube Daily Skeet Latch, MD   5 mg at 01/27/19 0817  . sodium chloride flush (NS) 0.9 % injection 10-40 mL  10-40 mL Intracatheter PRN Nita Sells, MD      . tamsulosin (FLOMAX) capsule 0.4 mg  0.4 mg Oral Daily Skeet Latch, MD   0.4 mg at 01/27/19 0174     Discharge Medications: Please see discharge summary for a list of discharge medications.  Relevant Imaging Results:  Relevant Lab Results:   Additional Information SSN: [id number]; COVID negtaive on 01/17/2019  Ivyanna Sibert B Darvis Croft,  [place]

## 2019-01-27 NOTE — Evaluation (Signed)
Physical Therapy Evaluation Patient Details Name: Audrey Collier MRN: [id number] DOB: 10-20-1960 Today's Date: 01/27/2019   History of Present Illness  58yo female s/p aortic valve replacement complicated by need for intubation and PEG placement during recent admit August-October 2020. Now transferred back to [facility] and admitted for ESBL UTI, mitral valve endocarditis. PMH anxiety, developmental delay, aortic valve replacement  Clinical Impression   Patient received in bed asleep, easily woken and able to follow basic commands/respond to very basic yes/no questions; at one point stated "I'm so sleepy" and RN confirms she has been very tired today. Grip strength quite weak bilaterally, and MMT B LEs estimated to be 2+/5 to 3-/5 per bed level assessment. Required MaxA for rolling over in bed but followed cues to do so well- will require +2 assist to progress mobility. She was left in bed with all needs met, bed alarm active. Currently recommending return to SNF once medically ready.     Follow Up Recommendations SNF;Supervision/Assistance - 24 hour([facility])    [facility] bed    Recommendations for Other Services       Precautions / Restrictions Precautions Precautions: Fall;Other (comment) Precaution Comments: PEG, developmental delay Restrictions Weight Bearing Restrictions: No      Mobility  Bed Mobility Overal bed mobility: Needs Assistance Bed Mobility: Rolling Rolling: Max assist         General bed mobility comments: MaxA for rolling in bed, will require +2 to progress mobility  Transfers                 General transfer comment: needs +2 for safety/physical assist  Ambulation/Gait             General Gait Details: unable  Stairs            Wheelchair Mobility    Modified Rankin (Stroke Patients Only)       Balance                                             Pertinent  Vitals/Pain Pain Assessment: Faces Pain Score: 0-No pain Faces Pain Scale: No hurt Pain Intervention(s): Limited activity within patient's tolerance;Monitored during session    Home Living Family/patient expects to be discharged to:: Skilled nursing facility                 Additional Comments: from [facility] per RN    Prior Function Level of [place]: Needs assistance   Gait / Transfers Assistance Needed: unsure- patient unable to provide history  ADL's / Homemaking Assistance Needed: unsure- patient unable to provide history        Hand Dominance   Dominant Hand: Right    Extremity/Trunk Assessment   Upper Extremity Assessment Upper Extremity Assessment: Generalized weakness    Lower Extremity Assessment Lower Extremity Assessment: Generalized weakness    Cervical / Trunk Assessment Cervical / Trunk Assessment: Kyphotic  Communication   Communication: Expressive difficulties(limited verbalizations)  Cognition Arousal/Alertness: Awake/alert Behavior During Therapy: Flat affect Overall Cognitive Status: History of cognitive impairments - at baseline                                 General Comments: able to answer very basic yes/no questions, at one point stated "I'm so sleepy", followed basic commands  with increased time      General Comments General comments (skin integrity, edema, etc.): bed level eval, unable to assess balance    Exercises     Assessment/Plan    PT Assessment Patient needs continued PT services  PT Problem List Decreased strength;Decreased cognition;Obesity;Decreased activity tolerance;Decreased safety awareness;Decreased balance;Decreased mobility;Decreased coordination       PT Treatment Interventions DME instruction;Balance training;Gait training;Neuromuscular re-education;Stair training;Functional mobility training;Patient/family education;Therapeutic activities;Therapeutic exercise    PT Goals (Current  goals can be found in the Care Plan section)  Acute Rehab PT Goals PT Goal Formulation: Patient unable to participate in goal setting Time For Goal Achievement: 02/10/19 Potential to Achieve Goals: Fair    Frequency Min 2X/week   Barriers to discharge        Co-evaluation               AM-PAC PT "6 Clicks" Mobility  Outcome Measure Help needed turning from your back to your side while in a flat bed without using bedrails?: A Lot Help needed moving from lying on your back to sitting on the side of a flat bed without using bedrails?: A Lot Help needed moving to and from a bed to a chair (including a wheelchair)?: Total Help needed standing up from a chair using your arms (e.g., wheelchair or bedside chair)?: Total Help needed to walk in hospital room?: Total Help needed climbing 3-5 steps with a railing? : Total 6 Click Score: 8    End of Session   Activity Tolerance: Patient tolerated treatment well Patient left: in bed;with call bell/phone within reach;with bed alarm set   PT Visit Diagnosis: Muscle weakness (generalized) (M62.81);Difficulty in walking, not elsewhere classified (R26.2)    Time: 4925-2415 PT Time Calculation (min) (ACUTE ONLY): 14 min   Charges:   PT Evaluation $PT Eval High Complexity: 1 High          Windell Norfolk, DPT, PN1   Supplemental Physical Therapist [place]    Pager [phone] Acute Rehab Office [phone]

## 2019-01-27 NOTE — Progress Notes (Signed)
[facility]   Reason for visit: Follow up on IE  Interval History: remains on ceftriaxone and ampicillin.  PICC line placed with a single lumen.    TEE with mobile vegetation and most concerning for infection, though could be clot.    Physical Exam: Constitutional:  Vitals:   01/27/19 1130 01/27/19 1200  BP: 104/64 102/67  Pulse: 86 86  Resp:  20  Temp: 98.7 F (37.1 C)   SpO2: 95% 95%   patient appears in NAD, does not complain of anything Eyes: anicteric HENT: no thrush Respiratory: Normal respiratory effort; CTA B Cardiovascular: RRR GI: soft, nt, nd  Review of Systems: Constitutional: negative for fevers and chills Gastrointestinal: negative for nausea Integument/breast: negative for rash  Lab Results  Component Value Date   WBC 15.3 (H) 01/24/2019   HGB 8.8 (L) 01/24/2019   HCT 28.0 (L) 01/24/2019   MCV 84.6 01/24/2019   PLT 116 (L) 01/24/2019    Lab Results  Component Value Date   CREATININE 1.16 (H) 01/26/2019   BUN 33 (H) 01/26/2019   NA 143 01/26/2019   K 3.4 (L) 01/26/2019   CL 109 01/26/2019   CO2 24 01/26/2019    Lab Results  Component Value Date   ALT 38 01/03/2019   AST 52 (H) 01/24/2019   ALKPHOS 291 (H) 01/16/2019     Microbiology: Recent Results (from the past 240 hour(s))  MRSA PCR Screening     Status: None   Collection Time: 01/26/19  2:53 AM   Specimen: Nasopharyngeal  Result Value Ref Range Status   MRSA by PCR NEGATIVE NEGATIVE Final    Comment:        The GeneXpert MRSA Assay (FDA approved for NASAL specimens only), is one component of a comprehensive MRSA colonization surveillance program. It is not intended to diagnose MRSA infection nor to guide or monitor treatment for MRSA infections. Performed at [facility], [address], [place], [place] [address]   Culture, blood (routine x 2)     Status: None (Preliminary result)   Collection Time: 01/26/19  3:32 AM   Specimen: BLOOD   Result Value Ref Range Status   Specimen Description BLOOD LEFT ANTECUBITAL  Final   Special Requests   Final    BOTTLES DRAWN AEROBIC ONLY Blood Culture adequate volume   Culture   Final    NO GROWTH 4 DAYS Performed at [facility], [address] [place], [place] [address]    Report Status PENDING  Incomplete  Culture, blood (routine x 2)     Status: None (Preliminary result)   Collection Time: Jan 26, 2019  3:36 AM   Specimen: BLOOD  Result Value Ref Range Status   Specimen Description BLOOD RIGHT ANTECUBITAL  Final   Special Requests   Final    BOTTLES DRAWN AEROBIC ONLY Blood Culture adequate volume   Culture   Final    NO GROWTH 4 DAYS Performed at [facility], [address], [place], [place] [address]    Report Status PENDING  Incomplete  SARS CORONAVIRUS 2 (TAT 6-24 HRS) Nasopharyngeal Nasopharyngeal Swab     Status: None   Collection Time: January 26, 2019  5:35 AM   Specimen: Nasopharyngeal Swab  Result Value Ref Range Status   SARS Coronavirus 2 NEGATIVE NEGATIVE Final    Comment: (NOTE) SARS-CoV-2 target nucleic acids are NOT DETECTED. The SARS-CoV-2 RNA is generally detectable in upper and lower respiratory specimens during  the acute phase of infection. Negative results do not preclude SARS-CoV-2 infection, do not rule out co-infections with other pathogens, and should not be used as the sole basis for treatment or other patient management decisions. Negative results must be combined with clinical observations, patient history, and epidemiological information. The expected result is Negative. Fact Sheet for Patients: SugarRoll.be Fact Sheet for Healthcare Providers: https://www.woods-mathews.com/ This test is not yet approved or cleared by the [place] FDA and  has been authorized for detection and/or diagnosis of SARS-CoV-2 by FDA under an Emergency Use Authorization (EUA). This EUA will remain  in effect  (meaning this test can be used) for the duration of the COVID-19 declaration under Section 56 4(b)(1) of the Act, 21 U.S.C. section 360bbb-3(b)(1), unless the authorization is terminated or revoked sooner. Performed at [facility], [place], [place], [place] [address]     Impression/Plan:  1. 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  Continue with ampicillin and ceftriaxone.    2.  Leukocytosis - will recheck tomorrow.    3.  Disposition - to SNF.   Dr. Baxter Flattery back on Monday.

## 2019-01-27 NOTE — Plan of Care (Signed)
Poc progressing.  

## 2019-01-27 NOTE — Progress Notes (Signed)
Update on pt's status and current plan of care discussed with pt's aunt, [name], at the bedside. [name] updated on visitation policy. All questions were answered.

## 2019-01-27 NOTE — Progress Notes (Addendum)
Progress Note  Patient Name: Audrey SevereSheila D Estrella Date of Encounter: 01/27/2019  Primary Cardiologist: Armanda Magicraci , MD   Subjective   No acute overnight events. Denies any CP or SOB  Inpatient Medications    Scheduled Meds: . apixaban  5 mg Oral BID  . Chlorhexidine Gluconate Cloth  6 each Topical Daily  . feeding supplement (PRO-STAT SUGAR FREE 64)  30 mL Per Tube TID  . FLUoxetine  10 mg Per Tube QHS  . free water  200 mL Per Tube Q6H  . levothyroxine  150 mcg Per Tube Q0600  . mouth rinse  15 mL Mouth Rinse BID  . metoCLOPramide  10 mg Per Tube TID AC & HS  . metoprolol tartrate  25 mg Per Tube BID  . pantoprazole sodium  40 mg Per Tube Daily  . polyethylene glycol  17 g Per Tube Q M,W,F  . rosuvastatin  5 mg Per Tube Daily  . tamsulosin  0.4 mg Oral Daily   Continuous Infusions: . ampicillin (OMNIPEN) IV 2 g (01/27/19 0554)  . cefTRIAXone (ROCEPHIN)  IV 2 g (01/26/19 2234)  . feeding supplement (OSMOLITE 1.5 CAL) 1,000 mL (01/26/19 2243)   PRN Meds: acetaminophen **OR** acetaminophen, ondansetron **OR** ondansetron (ZOFRAN) IV, sodium chloride flush   Vital Signs    Vitals:   01/27/19 0034 01/27/19 0448 01/27/19 0747 01/27/19 0821  BP: 109/61 119/66 102/65 98/61  Pulse: 83 92 100 100  Resp: 20 (!) 21 20   Temp: 98.8 F (37.1 C) 98.8 F (37.1 C) 98.3 F (36.8 C)   TempSrc: Oral Oral Oral   SpO2: 99% 99% 99% 93%  Weight:  68.1 kg    Height:        Intake/Output Summary (Last 24 hours) at 01/27/2019 0906 Last data filed at 01/27/2019 0100 Gross per 24 hour  Intake 1278.33 ml  Output -  Net 1278.33 ml   Last 3 Weights 01/27/2019 01/26/2019 01/11/2019  Weight (lbs) 150 lb 2.1 oz 144 lb 10 oz 144 lb 2.9 oz  Weight (kg) 68.1 kg 65.6 kg 65.4 kg      Telemetry    Sinus tachy at 100bpm - Personally Reviewed  ECG    No new ECG tracing since 01/29/2019. - Personally Reviewed  Physical Exam   GEN: No acute distress.   Neck: No JVD Cardiac: RRR, no  murmurs, rubs, or gallops.  Respiratory: Clear to auscultation bilaterally. GI: Soft, nontender, non-distended  MS: No edema; No deformity. Neuro:  Nonfocal  Psych: Normal affect   Labs    High Sensitivity Troponin:  No results for input(s): TROPONINIHS in the last 720 hours.    Chemistry Recent Labs  Lab 01/21/2019 0324 01/24/19 0312 01/06/2019 0521 01/26/19 0252  NA 138 141 139 143  K 3.8 3.8 3.4* 3.4*  CL 105 109 105 109  CO2 24 24 23 24   GLUCOSE 114* 93 97 123*  BUN 36* 30* 31* 33*  CREATININE 2.01* 1.44* 1.24* 1.16*  CALCIUM 8.7* 8.9 8.5* 8.6*  PROT 5.0*  --  4.7*  --   ALBUMIN 2.0*  --  1.8* 1.8*  AST 38  --  52*  --   ALT 24  --  38  --   ALKPHOS 166*  --  291*  --   BILITOT 0.7  --  0.3  --   GFRNONAA 27* 40* 48* 52*  GFRAA 31* 47* 56* >60  ANIONGAP 9 8 11 10      Hematology Recent  Labs  Lab February 09, 2019 0324 02/09/19 1959 01/24/19 0312  WBC 17.5*  --  15.3*  RBC 2.56*  2.56*  --  3.31*  HGB 7.1* 8.7* 8.8*  HCT 22.9* 28.6* 28.0*  MCV 89.5  --  84.6  MCH 27.7  --  26.6  MCHC 31.0  --  31.4  RDW 16.5*  --  21.2*  PLT 148*  --  116*    BNPNo results for input(s): BNP, PROBNP in the last 168 hours.   DDimer No results for input(s): DDIMER in the last 168 hours.   Radiology    [place] Ekg Site Rite  Result Date: 01/01/2019 If [facility] image not attached, placement could not be confirmed due to current cardiac rhythm.   Cardiac Studies   TTE Feb 09, 2019: Impressions:  1. Highly suggestive of multiple valve endocarditis. Mitral valve with moderate MR, visible vegetation on anterior leaflet not previously seen, and images suggest involvement of posterior leaflet as well. Bioprosthetic aortc valve leaflets appear  thickened, restricted, with Collier AS by gradients. There may also be a vegetation on the ventricular aspect of the aortic valve. There is a linear echodensity in RVOT, cannot see where it attaches, cannot exclude pulmonic valve endocarditis.  Recommend  TEE for further evaluation.  2. Left ventricular ejection fraction, by visual estimation, is 50 to 55%. The left ventricle has low normal function. There is mildly increased left ventricular hypertrophy.  3. Left ventricular diastolic parameters are consistent with Grade II diastolic dysfunction (pseudonormalization).  4. The left ventricle has no regional wall motion abnormalities.  5. Global right ventricle has mildly reduced systolic function.The right ventricular size is normal. Right vetricular wall thickness was not assessed.  6. Left atrial size was severely dilated.  7. Right atrial size was mildly dilated.  8. Small pericardial effusion.  9. Moderate vegetation on the mitral valve. 10. The mitral valve is abnormal. Moderate mitral valve regurgitation. 11. New mobile echodensity on anterior mitral valve leaflet, on atrial aspect of leaflet. Prior TEE images reviewed, not previously visualized. On image 14, there also may be additional involvement of posterior leaflet. 12. The tricuspid valve is grossly normal. Tricuspid valve regurgitation moderate-Collier. 13. Aortic valve regurgitation is trivial. Collier aortic valve stenosis. 67. 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 There appears to be an echodensity seen (see image 11) on ventricular side of valve. No significant regurgitation. Leaflets appear thickened and do not open fully. Increased valve gradient,  mean gradient 43 mmHg. Recommend further evaluation with TEE. 15. The pulmonic valve was grossly normal. Pulmonic valve regurgitation is mild to moderate. 16. There is a linear echodenisity seen in the RVOT, cannot clearly see to where it attaches. Cannot exclude pulmonary valve endocarditis. 17. The aortic root was not well visualized. 18. Moderately elevated pulmonary artery systolic pressure. 19. The tricuspid regurgitant velocity is 3.23 m/s, and with an assumed right atrial pressure of 8 mmHg, the  estimated right ventricular systolic pressure is moderately elevated at 49.6 mmHg. 20. The inferior vena cava is normal in size with <50% respiratory variability, suggesting right atrial pressure of 8 mmHg. 21. The interatrial septum was not well visualized. _______________  TEE 01/06/2019: Impressions:  1. Left ventricular ejection fraction, by visual estimation, is 50 to 55%. The left ventricle has normal function. Normal left ventricular size. There is no left ventricular hypertrophy.  2. Global right ventricle has normal systolic function.The right ventricular size is normal. No increase in right ventricular wall thickness.  3. Left atrial size was moderately dilated.  4. Right atrial size was normal.  5. The mitral valve is normal in structure. Moderate mitral valve regurgitation. No evidence of mitral stenosis.  6. Mobile vegetation (1.5cm x 0.5 cm) on the P3 segment of the mitral valve. 3D mitral valve images were obtained.  7. The tricuspid valve is normal in structure. Tricuspid valve regurgitation moderate.  8. Aortic valve mean gradient measures 35.0 mmHg.  9. Aortic valve peak gradient measures 63.4 mmHg. 10. Aortic valve regurgitation is not visualized. Moderate to Collier aortic valve stenosis. 11. There is a mobile 1.3 cm x 0.9 cm vegetation on the aortic aspect of the bioprosthetic aortic valve. Acceleration time 98 ms. DI 0.26. 3D images were obtained. 12. The pulmonic valve was normal in structure. Pulmonic valve regurgitation is not visualized.  Patient Profile     58 y.o. female with a history of endocarditis s/p AVR in 10/2018, hypothyroidism, GERD, and developmental delay who was admitted for bacteremia and mitral valve endocarditis after presenting with altered mental status and fever.  Assessment & Plan    Bacteremia/Mitral and Aortic Valve Endocarditis/History of AVR  -TEE on 11/125/2020 showed mobile vegetation on the P3 segment of the mitral valve and mobile  vegetation on the aortic aspect of the bioprosthetic aortic valve. See full report above. - Continue IV antibiotics per primary team. - appreciated CVTS input - not a candidate for surgery at this time  Aortic Stenosis -CVTS recommended consideration of Cardiac CT to evaluate for possible thrombus on AVR bioprosthesis.   -Cardiac MRI would be better for evaluation of prosthetic valve thrombus.   -I think is it unlikely that she has a thrombus on her AV bioprosthesis as she is on anticoagulation and has known MV endocarditis.  This is likely AV endocarditis as well.   -at this time will continue with antibx for presumed AV endocarditis  AKI - Creatinine peaked at 2.01 on 2019-01-30. Improving and 1.16 yesterday. - Continue to monitor.  Will follow at a distance over the weekend and see back on Monday. Call with any questions.   For questions or updates, please contact [facility] Please consult [email or web address] for contact info under        Signed, Corrin Parker, PA-C  01/27/2019, 9:06 AM

## 2019-01-27 NOTE — Progress Notes (Signed)
PROGRESS NOTE    Audrey Collier  DVV:[id number] DOB: 10/16/1960 DOA: 01/10/2019 PCP: Pearson Grippe, MD   Brief Narrative: Audrey Collier is a 58 y.o. female with history of developmental delay, hypothyroidism who was recently admitted in August 2020 for fever was found to have MSSA bacteremia aortic valve vegetation. She presented secondary to positive blood cultures and was transferred to [facility] from [facility] hospital with evidence atrial valve endocarditis.   Assessment & Plan:   Principal Problem:   Bacteremia due to Enterococcus Active Problems:   S/P AVR (aortic valve replacement)   Nausea & vomiting   Bacteriuria, ESBL Klebsiella    Malnutrition of moderate degree   Enterococcus bacteremia Mitral/aortic endocarditis CT surgery evaluated and patient is not a candidate for surgery. Previously treated with vancomycin at OSH and now on ampicillin/ceftriaxone. Repeat blood cultures no growth to date. -ID recommendations: Continue ampicillin and ceftriaxone  ESBL UTI Treated initially with one dose of Merrem, switched to Fosfomax for one dose and treatment discontinued per ID recommendations  Aortic stenosis S/p AV replacement CT surgery evaluated. Recommendation for cardiac CT. Cardiology recommending cardiac MRI as a better option but recommending to treat as presumed AV endocarditis and that it is unlikely a prosthetic valve thrombus.  Normocytic anemia Stable.  History of pulmonary embolism -Continue Eliquis  Thrombocytopenia Thought to be secondary to sepsis.   AKI on CKD stage IIIb Baseline creatinine appears to have been about 3.5. improved from recent baseline.  Malnutrition Patient presented with PEG tube -Continue tube feeds  Depression -Continue Prozac  Hyperlipidemia -Continue Crestor  Hypothyroidism -Continue Synthroid 125 mcg daily   DVT prophylaxis: Eliquis Code Status:   Code Status: Full Code Family Communication: None Disposition Plan:  Discharge pending continued recommendations   Consultants:   Infectious disease  Cardiology  Cardiothoracic surgery  Procedures:   11/23: Transthoracic Echocardiogram IMPRESSIONS    1. Highly suggestive of multiple valve endocarditis. Mitral valve with moderate MR, visible vegetation on anterior leaflet not previously seen, and images suggest involvement of posterior leaflet as well. Bioprosthetic aortc valve leaflets appear  thickened, restricted, with severe AS by gradients. There may also be a vegetation on the ventricular aspect of the aortic valve. There is a linear echodensity in RVOT, cannot see where it attaches, cannot exclude pulmonic valve endocarditis. Recommend  TEE for further evaluation.  2. Left ventricular ejection fraction, by visual estimation, is 50 to 55%. The left ventricle has low normal function. There is mildly increased left ventricular hypertrophy.  3. Left ventricular diastolic parameters are consistent with Grade II diastolic dysfunction (pseudonormalization).  4. The left ventricle has no regional wall motion abnormalities.  5. Global right ventricle has mildly reduced systolic function.The right ventricular size is normal. Right vetricular wall thickness was not assessed.  6. Left atrial size was severely dilated.  7. Right atrial size was mildly dilated.  8. Small pericardial effusion.  9. Moderate vegetation on the mitral valve. 10. The mitral valve is abnormal. Moderate mitral valve regurgitation. 11. New mobile echodensity on anterior mitral valve leaflet, on atrial aspect of leaflet. Prior TEE images reviewed, not previously visualized. On image 14, there also may be additional involvement of posterior leaflet. 12. The tricuspid valve is grossly normal. Tricuspid valve regurgitation moderate-severe. 13. Aortic valve regurgitation is trivial. Severe aortic valve stenosis. 14. 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 There  appears to be an echodensity seen (see image 11) on ventricular side of valve. No significant regurgitation.  Leaflets appear thickened and do not open fully. Increased valve gradient,  mean gradient 43 mmHg. Recommend further evaluation with TEE. 15. The pulmonic valve was grossly normal. Pulmonic valve regurgitation is mild to moderate. 16. There is a linear echodenisity seen in the RVOT, cannot clearly see to where it attaches. Cannot exclude pulmonary valve endocarditis. 17. The aortic root was not well visualized. 18. Moderately elevated pulmonary artery systolic pressure. 19. The tricuspid regurgitant velocity is 3.23 m/s, and with an assumed right atrial pressure of 8 mmHg, the estimated right ventricular systolic pressure is moderately elevated at 49.6 mmHg. 20. The inferior vena cava is normal in size with <50% respiratory variability, suggesting right atrial pressure of 8 mmHg. 21. The interatrial septum was not well visualized.    11/25: Transesophageal Echocardiogram  IMPRESSIONS    1. Left ventricular ejection fraction, by visual estimation, is 50 to 55%. The left ventricle has normal function. Normal left ventricular size. There is no left ventricular hypertrophy.  2. Global right ventricle has normal systolic function.The right ventricular size is normal. No increase in right ventricular wall thickness.  3. Left atrial size was moderately dilated.  4. Right atrial size was normal.  5. The mitral valve is normal in structure. Moderate mitral valve regurgitation. No evidence of mitral stenosis.  6. Mobile vegetation (1.5cm x 0.5 cm) on the P3 segment of the mitral valve. 3D mitral valve images were obtained.  7. The tricuspid valve is normal in structure. Tricuspid valve regurgitation moderate.  8. Aortic valve mean gradient measures 35.0 mmHg.  9. Aortic valve peak gradient measures 63.4 mmHg. 10. Aortic valve regurgitation is not visualized. Moderate to severe aortic valve  stenosis. 11. There is a mobile 1.3 cm x 0.9 cm vegetation on the aortic aspect of the bioprosthetic aortic valve. Acceleration time 98 ms. DI 0.26. 3D images were obtained. 12. The pulmonic valve was normal in structure. Pulmonic valve regurgitation is not visualized.  Antimicrobials:  Merrem  Fosfomycin  Ampicillin  Ceftriaxone    Subjective: No concerns  Objective: Vitals:   01/27/19 0747 01/27/19 0821 01/27/19 1000 01/27/19 1020  BP: 102/65 98/61  107/61  Pulse: 100 100  (!) 101  Resp: 20     Temp: 98.3 F (36.8 C)     TempSrc: Oral     SpO2: 99% 93% (!) 89% 95%  Weight:      Height:        Intake/Output Summary (Last 24 hours) at 01/27/2019 1040 Last data filed at 01/27/2019 0100 Gross per 24 hour  Intake 1278.33 ml  Output -  Net 1278.33 ml   Filed Weights   02/16/19 1117 01/26/19 0500 01/27/19 0448  Weight: 65.4 kg 65.6 kg 68.1 kg    Examination:  General exam: Appears calm and comfortable Respiratory system: Clear to auscultation. Respiratory effort normal. Cardiovascular system: S1 & S2 heard, RRR. 3/6 systolic murmurs heard throughout chest Gastrointestinal system: Abdomen is nondistended, soft and nontender. No organomegaly or masses felt. Normal bowel sounds heard. PEG site appears clean. Central nervous system: Alert and oriented to person. Extremities: No edema. No calf tenderness Skin: No cyanosis. No rashes Psychiatry: Blunt affect     Data Reviewed: I have personally reviewed following labs and imaging studies  CBC: Recent Labs  Lab 01/22/2019 0324 01/17/2019 1959 01/24/19 0312  WBC 17.5*  --  15.3*  NEUTROABS 13.9*  --  11.6*  HGB 7.1* 8.7* 8.8*  HCT 22.9* 28.6* 28.0*  MCV 89.5  --  84.6  PLT 148*  --  116*   Basic Metabolic Panel: Recent Labs  Lab 01/30/2019 0324 01/24/19 0312 01/24/19 1431 01/13/2019 0521 01/16/2019 1647 01/26/19 0252  NA 138 141  --  139  --  143  K 3.8 3.8  --  3.4*  --  3.4*  CL 105 109  --  105  --  109   CO2 24 24  --  23  --  24  GLUCOSE 114* 93  --  97  --  123*  BUN 36* 30*  --  31*  --  33*  CREATININE 2.01* 1.44*  --  1.24*  --  1.16*  CALCIUM 8.7* 8.9  --  8.5*  --  8.6*  MG  --   --  1.9 1.8 1.8  --   PHOS  --   --  4.2 4.2 4.5 4.5   GFR: Estimated Creatinine Clearance: 48.4 mL/min (A) (by C-G formula based on SCr of 1.16 mg/dL (H)). Liver Function Tests: Recent Labs  Lab 01/19/2019 0324 01/26/2019 0521 01/26/19 0252  AST 38 52*  --   ALT 24 38  --   ALKPHOS 166* 291*  --   BILITOT 0.7 0.3  --   PROT 5.0* 4.7*  --   ALBUMIN 2.0* 1.8* 1.8*   No results for input(s): LIPASE, AMYLASE in the last 168 hours. No results for input(s): AMMONIA in the last 168 hours. Coagulation Profile: No results for input(s): INR, PROTIME in the last 168 hours. Cardiac Enzymes: No results for input(s): CKTOTAL, CKMB, CKMBINDEX, TROPONINI in the last 168 hours. BNP (last 3 results) No results for input(s): PROBNP in the last 8760 hours. HbA1C: No results for input(s): HGBA1C in the last 72 hours. CBG: Recent Labs  Lab 01/26/19 1653 01/26/19 1944 01/27/19 0036 01/27/19 0457 01/27/19 0746  GLUCAP 99 114* 142* 136* 117*   Lipid Profile: No results for input(s): CHOL, HDL, LDLCALC, TRIG, CHOLHDL, LDLDIRECT in the last 72 hours. Thyroid Function Tests: No results for input(s): TSH, T4TOTAL, FREET4, T3FREE, THYROIDAB in the last 72 hours. Anemia Panel: No results for input(s): VITAMINB12, FOLATE, FERRITIN, TIBC, IRON, RETICCTPCT in the last 72 hours. Sepsis Labs: No results for input(s): PROCALCITON, LATICACIDVEN in the last 168 hours.  Recent Results (from the past 240 hour(s))  MRSA PCR Screening     Status: None   Collection Time: 01/19/2019  2:53 AM   Specimen: Nasopharyngeal  Result Value Ref Range Status   MRSA by PCR NEGATIVE NEGATIVE Final    Comment:        The GeneXpert MRSA Assay (FDA approved for NASAL specimens only), is one component of a comprehensive MRSA  colonization surveillance program. It is not intended to diagnose MRSA infection nor to guide or monitor treatment for MRSA infections. Performed at [facility], [address] [place], [place] [address]   Culture, blood (routine x 2)     Status: None (Preliminary result)   Collection Time: 01/10/2019  3:32 AM   Specimen: BLOOD  Result Value Ref Range Status   Specimen Description BLOOD LEFT ANTECUBITAL  Final   Special Requests   Final    BOTTLES DRAWN AEROBIC ONLY Blood Culture adequate volume   Culture   Final    NO GROWTH 3 DAYS Performed at [facility], [address] [place], [place] [address]    Report Status PENDING  Incomplete  Culture, blood (routine x 2)     Status: None (  Preliminary result)   Collection Time: Feb 07, 2019  3:36 AM   Specimen: BLOOD  Result Value Ref Range Status   Specimen Description BLOOD RIGHT ANTECUBITAL  Final   Special Requests   Final    BOTTLES DRAWN AEROBIC ONLY Blood Culture adequate volume   Culture   Final    NO GROWTH 3 DAYS Performed at [facility], [address] [place], [place] [address]    Report Status PENDING  Incomplete  SARS CORONAVIRUS 2 (TAT 6-24 HRS) Nasopharyngeal Nasopharyngeal Swab     Status: None   Collection Time: 02-07-19  5:35 AM   Specimen: Nasopharyngeal Swab  Result Value Ref Range Status   SARS Coronavirus 2 NEGATIVE NEGATIVE Final    Comment: (NOTE) SARS-CoV-2 target nucleic acids are NOT DETECTED. The SARS-CoV-2 RNA is generally detectable in upper and lower respiratory specimens during the acute phase of infection. Negative results do not preclude SARS-CoV-2 infection, do not rule out co-infections with other pathogens, and should not be used as the sole basis for treatment or other patient management decisions. Negative results must be combined with clinical observations, patient history, and epidemiological information. The expected result is Negative. Fact Sheet for Patients:  SugarRoll.be Fact Sheet for Healthcare Providers: https://www.woods-mathews.com/ This test is not yet approved or cleared by the [place] FDA and  has been authorized for detection and/or diagnosis of SARS-CoV-2 by FDA under an Emergency Use Authorization (EUA). This EUA will remain  in effect (meaning this test can be used) for the duration of the COVID-19 declaration under Section 56 4(b)(1) of the Act, 21 U.S.C. section 360bbb-3(b)(1), unless the authorization is terminated or revoked sooner. Performed at [facility], [place] [address] [place],  [address]          Radiology Studies: [place] Ekg Site Rite  Result Date: 01/01/2019 If [facility] image not attached, placement could not be confirmed due to current cardiac rhythm.       Scheduled Meds: . apixaban  5 mg Oral BID  . Chlorhexidine Gluconate Cloth  6 each Topical Daily  . feeding supplement (PRO-STAT SUGAR FREE 64)  30 mL Per Tube TID  . FLUoxetine  10 mg Per Tube QHS  . free water  200 mL Per Tube Q6H  . levothyroxine  150 mcg Per Tube Q0600  . mouth rinse  15 mL Mouth Rinse BID  . metoCLOPramide  10 mg Per Tube TID AC & HS  . metoprolol tartrate  25 mg Per Tube BID  . pantoprazole sodium  40 mg Per Tube Daily  . polyethylene glycol  17 g Per Tube Q M,W,F  . rosuvastatin  5 mg Per Tube Daily  . tamsulosin  0.4 mg Oral Daily   Continuous Infusions: . ampicillin (OMNIPEN) IV 2 g (01/27/19 0554)  . cefTRIAXone (ROCEPHIN)  IV 2 g (01/27/19 1027)  . feeding supplement (OSMOLITE 1.5 CAL) 1,000 mL (01/26/19 2243)     LOS: 4 days     Cordelia Poche, MD [facility] 01/27/2019, 10:40 AM  If 7PM-7AM, please contact night-coverage [email or web address]

## 2019-01-27 NOTE — TOC Initial Note (Signed)
Transition of Care [facility]) - Initial/Assessment Note    Patient Details  Name: Audrey Collier MRN: [id number] Date of Birth: 08-20-1960  Transition of Care [facility]) CM/SW Contact:    Gwenlyn Fudge, LCSWA Phone Number: 01/27/2019, 4:10 PM  Clinical Narrative:                  Patient is from [facility]. CSW called and spoke with the patient's aunt, Mehek Grega. Family wants her to be in SNF short term and not long term. They want her able to walk again and return home.   Family wants PT and OT to work with her in rehabilitation. CSW expressed that she will share this the facility.   CSW will continue to follow and assist with disposition planning.   Expected Discharge Plan: Skilled Nursing Facility Barriers to Discharge: Continued Medical Work up   Patient Goals and CMS Choice Patient states their goals for this hospitalization and ongoing recovery are:: Pt will go back to [facility] once she is medically ready   Choice offered to / list presented to : NA  Expected Discharge Plan and Services Expected Discharge Plan: Skilled Nursing Facility In-house Referral: Clinical Social Work Discharge Planning Services: NA Post Acute Care Choice: Skilled Nursing Facility Living arrangements for the past 2 months: Skilled Nursing Facility                 DME Arranged: N/A DME Agency: NA       HH Arranged: NA HH Agency: NA        Prior Living Arrangements/Services Living arrangements for the past 2 months: Skilled Nursing Facility Lives with:: Facility Resident Patient language and need for interpreter reviewed:: No Do you feel safe going back to the place where you live?: Yes      Need for Family Participation in Patient Care: Yes (Comment) Care giver support system in place?: Yes (comment)   Criminal Activity/Legal Involvement Pertinent to Current Situation/Hospitalization: No - Comment as needed  Activities of Daily Living Home Assistive  Devices/Equipment: None ADL Screening (condition at time of admission) Patient's cognitive ability adequate to safely complete daily activities?: No Is the patient deaf or have difficulty hearing?: No Does the patient have difficulty seeing, even when wearing glasses/contacts?: No Does the patient have difficulty concentrating, remembering, or making decisions?: Yes Patient able to express need for assistance with ADLs?: Yes Does the patient have difficulty dressing or bathing?: Yes Independently performs ADLs?: No Communication: Needs assistance Is this a change from baseline?: Pre-admission baseline Dressing (OT): Needs assistance Is this a change from baseline?: Pre-admission baseline Grooming: Needs assistance Is this a change from baseline?: Pre-admission baseline Feeding: Needs assistance Is this a change from baseline?: Pre-admission baseline Bathing: Needs assistance Is this a change from baseline?: Pre-admission baseline Toileting: Needs assistance Is this a change from baseline?: Pre-admission baseline In/Out Bed: Needs assistance Is this a change from baseline?: Pre-admission baseline Walks in Home: Needs assistance Is this a change from baseline?: Pre-admission baseline Does the patient have difficulty walking or climbing stairs?: Yes Weakness of Legs: Both Weakness of Arms/Hands: Both  Permission Sought/Granted Permission sought to share information with : Case Manager Permission granted to share information with : Yes, Verbal Permission Granted     Permission granted to share info w AGENCY: [facility]        Emotional Assessment Appearance:: Appears stated age Attitude/Demeanor/Rapport: Unable to Assess Affect (typically observed): Unable to Assess Orientation: : Oriented to Self  Alcohol / Substance Use: Not Applicable Psych Involvement: No (comment)  Admission diagnosis:  HEART FAILURE SEPSIS Patient Active Problem List   Diagnosis Date  Noted  . Malnutrition of moderate degree 01/08/2019  . Bacteremia due to Enterococcus 01/02/2019  . Bacteriuria, ESBL Klebsiella  01/20/2019  . Nausea & vomiting   . Adult failure to thrive   . Weakness generalized   . Palliative care by specialist   . Pressure injury of skin 10/10/2018  . S/P AVR (aortic valve replacement) 10/07/2018  . Acute respiratory failure with hypoxia ([place])   . Staphylococcus aureus bacteremia with sepsis ([place]) 10/03/2018  . Bacteremia due to methicillin susceptible Staphylococcus aureus (MSSA)   . Aortic valve endocarditis   . Multifocal pneumonia   . Acute hypoxemic respiratory failure ([place])   . Severe sepsis ([place]) 10/01/2018  . GERD (gastroesophageal reflux disease) 03/18/2017  . Loose stools 09/30/2015  . Abdominal pain 11/30/2014  . Helicobacter pylori gastritis 10/24/2014  . PMB (postmenopausal bleeding) 03/26/2014  . Thickened endometrium 03/26/2014  . Endometrial polyp 03/26/2014  . Constipation 04/11/2013  . Fecal occult blood test positive 03/15/2013   PCP:  Jani Gravel, MD Pharmacy:   [facility] [place], [place] [address] Phone: [phone] Fax: [phone]     Social Determinants of Health (SDOH) Interventions    Readmission Risk Interventions No flowsheet data found.

## 2019-01-28 ENCOUNTER — Inpatient Hospital Stay (HOSPITAL_COMMUNITY): Payer: Medicaid Other

## 2019-01-28 LAB — CULTURE, BLOOD (ROUTINE X 2)
Culture: NO GROWTH
Culture: NO GROWTH
Special Requests: ADEQUATE
Special Requests: ADEQUATE

## 2019-01-28 LAB — CBC
HCT: 28.9 % — ABNORMAL LOW (ref 36.0–46.0)
Hemoglobin: 8.6 g/dL — ABNORMAL LOW (ref 12.0–15.0)
MCH: 26.3 pg (ref 26.0–34.0)
MCHC: 29.8 g/dL — ABNORMAL LOW (ref 30.0–36.0)
MCV: 88.4 fL (ref 80.0–100.0)
Platelets: 90 10*3/uL — ABNORMAL LOW (ref 150–400)
RBC: 3.27 MIL/uL — ABNORMAL LOW (ref 3.87–5.11)
RDW: 19.3 % — ABNORMAL HIGH (ref 11.5–15.5)
WBC: 16.3 10*3/uL — ABNORMAL HIGH (ref 4.0–10.5)
nRBC: 0 % (ref 0.0–0.2)

## 2019-01-28 LAB — GLUCOSE, CAPILLARY
Glucose-Capillary: 102 mg/dL — ABNORMAL HIGH (ref 70–99)
Glucose-Capillary: 116 mg/dL — ABNORMAL HIGH (ref 70–99)
Glucose-Capillary: 119 mg/dL — ABNORMAL HIGH (ref 70–99)
Glucose-Capillary: 121 mg/dL — ABNORMAL HIGH (ref 70–99)
Glucose-Capillary: 122 mg/dL — ABNORMAL HIGH (ref 70–99)
Glucose-Capillary: 138 mg/dL — ABNORMAL HIGH (ref 70–99)
Glucose-Capillary: 139 mg/dL — ABNORMAL HIGH (ref 70–99)

## 2019-01-28 MED ORDER — METRONIDAZOLE 500 MG PO TABS
500.0000 mg | ORAL_TABLET | Freq: Three times a day (TID) | ORAL | Status: AC
  Administered 2019-01-28 – 2019-02-02 (×14): 500 mg via ORAL
  Filled 2019-01-28 (×15): qty 1

## 2019-01-28 NOTE — Progress Notes (Addendum)
PROGRESS NOTE    AMIR GLAUS  GNF:[id number] DOB: 09-27-1960 DOA: 01/12/2019 PCP: Pearson Grippe, MD   Brief Narrative: Audrey Collier is a 58 y.o. female with history of developmental delay, hypothyroidism who was recently admitted in August 2020 for fever was found to have MSSA bacteremia aortic valve vegetation. She presented secondary to positive blood cultures and was transferred to [facility] from [facility] hospital with evidence atrial valve endocarditis.   Assessment & Plan:   Principal Problem:   Bacteremia due to Enterococcus Active Problems:   S/P AVR (aortic valve replacement)   Nausea & vomiting   Bacteriuria, ESBL Klebsiella    Malnutrition of moderate degree   Enterococcus bacteremia Mitral/aortic endocarditis CT surgery evaluated and patient is not a candidate for surgery. Previously treated with vancomycin at OSH and now on ampicillin/ceftriaxone. Repeat blood cultures no growth to date. -ID recommendations: Continue ampicillin and ceftriaxone; 6 week total course  ESBL UTI Treated initially with one dose of Merrem, switched to Fosfomax for one dose and treatment discontinued per ID recommendations  Aortic stenosis S/p AV replacement CT surgery evaluated. Recommendation for cardiac CT. Cardiology recommending cardiac MRI as a better option but recommending to treat as presumed AV endocarditis and that it is unlikely a prosthetic valve thrombus.  Emesis Possibly secondary to tube feeds. Patient's aunt states she was told she has gastroparesis.  -Zofran prn  Leukocytosis In setting of bacteremia. Persistent. Concern for possible aspiration as this has been an issue in the past. -Chest x-ray; if concern for infiltrate, may add Flagyl for a few days for coverage. Discussed with ID.  Normocytic anemia Stable.  History of pulmonary embolism -Continue Eliquis  Thrombocytopenia Thought to be secondary to sepsis  AKI on CKD stage IIIb Baseline creatinine appears to  have been about 3.5. improved from recent baseline  Malnutrition Patient presented with PEG tube -Continue tube feeds  Depression -Continue Prozac  Hyperlipidemia -Continue Crestor  Hypothyroidism -Continue Synthroid 125 mcg daily   DVT prophylaxis: Eliquis Code Status:   Code Status: Full Code Family Communication: Aunt at bedside Disposition Plan: Discharge pending continued recommendations   Consultants:   Infectious disease  Cardiology  Cardiothoracic surgery  Procedures:   11/23: Transthoracic Echocardiogram IMPRESSIONS    1. Highly suggestive of multiple valve endocarditis. Mitral valve with moderate MR, visible vegetation on anterior leaflet not previously seen, and images suggest involvement of posterior leaflet as well. Bioprosthetic aortc valve leaflets appear  thickened, restricted, with severe AS by gradients. There may also be a vegetation on the ventricular aspect of the aortic valve. There is a linear echodensity in RVOT, cannot see where it attaches, cannot exclude pulmonic valve endocarditis. Recommend  TEE for further evaluation.  2. Left ventricular ejection fraction, by visual estimation, is 50 to 55%. The left ventricle has low normal function. There is mildly increased left ventricular hypertrophy.  3. Left ventricular diastolic parameters are consistent with Grade II diastolic dysfunction (pseudonormalization).  4. The left ventricle has no regional wall motion abnormalities.  5. Global right ventricle has mildly reduced systolic function.The right ventricular size is normal. Right vetricular wall thickness was not assessed.  6. Left atrial size was severely dilated.  7. Right atrial size was mildly dilated.  8. Small pericardial effusion.  9. Moderate vegetation on the mitral valve. 10. The mitral valve is abnormal. Moderate mitral valve regurgitation. 11. New mobile echodensity on anterior mitral valve leaflet, on atrial aspect of leaflet.  Prior TEE images reviewed, not previously visualized.  On image 14, there also may be additional involvement of posterior leaflet. 12. The tricuspid valve is grossly normal. Tricuspid valve regurgitation moderate-severe. 13. Aortic valve regurgitation is trivial. Severe aortic valve stenosis. 14. 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 There appears to be an echodensity seen (see image 11) on ventricular side of valve. No significant regurgitation. Leaflets appear thickened and do not open fully. Increased valve gradient,  mean gradient 43 mmHg. Recommend further evaluation with TEE. 15. The pulmonic valve was grossly normal. Pulmonic valve regurgitation is mild to moderate. 16. There is a linear echodenisity seen in the RVOT, cannot clearly see to where it attaches. Cannot exclude pulmonary valve endocarditis. 17. The aortic root was not well visualized. 18. Moderately elevated pulmonary artery systolic pressure. 19. The tricuspid regurgitant velocity is 3.23 m/s, and with an assumed right atrial pressure of 8 mmHg, the estimated right ventricular systolic pressure is moderately elevated at 49.6 mmHg. 20. The inferior vena cava is normal in size with <50% respiratory variability, suggesting right atrial pressure of 8 mmHg. 21. The interatrial septum was not well visualized.    11/25: Transesophageal Echocardiogram  IMPRESSIONS    1. Left ventricular ejection fraction, by visual estimation, is 50 to 55%. The left ventricle has normal function. Normal left ventricular size. There is no left ventricular hypertrophy.  2. Global right ventricle has normal systolic function.The right ventricular size is normal. No increase in right ventricular wall thickness.  3. Left atrial size was moderately dilated.  4. Right atrial size was normal.  5. The mitral valve is normal in structure. Moderate mitral valve regurgitation. No evidence of mitral stenosis.  6. Mobile vegetation  (1.5cm x 0.5 cm) on the P3 segment of the mitral valve. 3D mitral valve images were obtained.  7. The tricuspid valve is normal in structure. Tricuspid valve regurgitation moderate.  8. Aortic valve mean gradient measures 35.0 mmHg.  9. Aortic valve peak gradient measures 63.4 mmHg. 10. Aortic valve regurgitation is not visualized. Moderate to severe aortic valve stenosis. 11. There is a mobile 1.3 cm x 0.9 cm vegetation on the aortic aspect of the bioprosthetic aortic valve. Acceleration time 98 ms. DI 0.26. 3D images were obtained. 12. The pulmonic valve was normal in structure. Pulmonic valve regurgitation is not visualized.  Antimicrobials:  Merrem  Fosfomycin  Ampicillin  Ceftriaxone    Subjective: Episode of vomiting today. No other issues.  Objective: Vitals:   01/28/19 0420 01/28/19 0820 01/28/19 1038 01/28/19 1113  BP: 96/62 111/72  109/67  Pulse: 97 (!) 102 99 (!) 103  Resp: 20 19  20   Temp: 98.6 F (37 C) 98.5 F (36.9 C)  98.4 F (36.9 C)  TempSrc: Oral Oral  Oral  SpO2: 92% 96%  95%  Weight: 69.5 kg     Height:        Intake/Output Summary (Last 24 hours) at 01/28/2019 1459 Last data filed at 01/28/2019 1218 Gross per 24 hour  Intake 120 ml  Output 600 ml  Net -480 ml   Filed Weights   01/26/19 0500 01/27/19 0448 01/28/19 0420  Weight: 65.6 kg 68.1 kg 69.5 kg    Examination:  General exam: Appears calm and comfortable Respiratory system: diminished. Respiratory effort normal. Cardiovascular system: S1 & S2 heard, RRR. 2/6 systolic murmur throughout Gastrointestinal system: Abdomen is nondistended, soft and nontender. No organomegaly or masses felt. Normal bowel sounds heard. Central nervous system: Alert Extremities: No edema. No calf tenderness Skin: No  cyanosis. No rashes     Data Reviewed: I have personally reviewed following labs and imaging studies  CBC: Recent Labs  Lab 01/22/2019 0324 01/18/2019 1959 01/24/19 0312 01/28/19 0455   WBC 17.5*  --  15.3* 16.3*  NEUTROABS 13.9*  --  11.6*  --   HGB 7.1* 8.7* 8.8* 8.6*  HCT 22.9* 28.6* 28.0* 28.9*  MCV 89.5  --  84.6 88.4  PLT 148*  --  116* 90*   Basic Metabolic Panel: Recent Labs  Lab 01/13/2019 0324 01/24/19 0312 01/24/19 1431 Feb 06, 2019 0521 02-06-2019 1647 01/26/19 0252  NA 138 141  --  139  --  143  K 3.8 3.8  --  3.4*  --  3.4*  CL 105 109  --  105  --  109  CO2 24 24  --  23  --  24  GLUCOSE 114* 93  --  97  --  123*  BUN 36* 30*  --  31*  --  33*  CREATININE 2.01* 1.44*  --  1.24*  --  1.16*  CALCIUM 8.7* 8.9  --  8.5*  --  8.6*  MG  --   --  1.9 1.8 1.8  --   PHOS  --   --  4.2 4.2 4.5 4.5   GFR: Estimated Creatinine Clearance: 48.9 mL/min (A) (by C-G formula based on SCr of 1.16 mg/dL (H)). Liver Function Tests: Recent Labs  Lab 01/03/2019 0324 02-06-19 0521 01/26/19 0252  AST 38 52*  --   ALT 24 38  --   ALKPHOS 166* 291*  --   BILITOT 0.7 0.3  --   PROT 5.0* 4.7*  --   ALBUMIN 2.0* 1.8* 1.8*   No results for input(s): LIPASE, AMYLASE in the last 168 hours. No results for input(s): AMMONIA in the last 168 hours. Coagulation Profile: No results for input(s): INR, PROTIME in the last 168 hours. Cardiac Enzymes: No results for input(s): CKTOTAL, CKMB, CKMBINDEX, TROPONINI in the last 168 hours. BNP (last 3 results) No results for input(s): PROBNP in the last 8760 hours. HbA1C: No results for input(s): HGBA1C in the last 72 hours. CBG: Recent Labs  Lab 01/27/19 2016 01/28/19 0017 01/28/19 0423 01/28/19 0755 01/28/19 1110  GLUCAP 113* 138* 116* 119* 121*   Lipid Profile: No results for input(s): CHOL, HDL, LDLCALC, TRIG, CHOLHDL, LDLDIRECT in the last 72 hours. Thyroid Function Tests: No results for input(s): TSH, T4TOTAL, FREET4, T3FREE, THYROIDAB in the last 72 hours. Anemia Panel: No results for input(s): VITAMINB12, FOLATE, FERRITIN, TIBC, IRON, RETICCTPCT in the last 72 hours. Sepsis Labs: No results for input(s):  PROCALCITON, LATICACIDVEN in the last 168 hours.  Recent Results (from the past 240 hour(s))  MRSA PCR Screening     Status: None   Collection Time: 01/12/2019  2:53 AM   Specimen: Nasopharyngeal  Result Value Ref Range Status   MRSA by PCR NEGATIVE NEGATIVE Final    Comment:        The GeneXpert MRSA Assay (FDA approved for NASAL specimens only), is one component of a comprehensive MRSA colonization surveillance program. It is not intended to diagnose MRSA infection nor to guide or monitor treatment for MRSA infections. Performed at [facility], [address], [place], [place] [address]   Culture, blood (routine x 2)     Status: None   Collection Time: 01/30/2019  3:32 AM   Specimen: BLOOD  Result Value Ref Range Status   Specimen Description BLOOD  LEFT ANTECUBITAL  Final   Special Requests   Final    BOTTLES DRAWN AEROBIC ONLY Blood Culture adequate volume   Culture   Final    NO GROWTH 5 DAYS Performed at [facility], [address] [place], [place] [address]    Report Status 01/28/2019 FINAL  Final  Culture, blood (routine x 2)     Status: None   Collection Time: 01/22/2019  3:36 AM   Specimen: BLOOD  Result Value Ref Range Status   Specimen Description BLOOD RIGHT ANTECUBITAL  Final   Special Requests   Final    BOTTLES DRAWN AEROBIC ONLY Blood Culture adequate volume   Culture   Final    NO GROWTH 5 DAYS Performed at [facility], [address] [place], [place] [address]    Report Status 01/28/2019 FINAL  Final  SARS CORONAVIRUS 2 (TAT 6-24 HRS) Nasopharyngeal Nasopharyngeal Swab     Status: None   Collection Time: 01/04/2019  5:35 AM   Specimen: Nasopharyngeal Swab  Result Value Ref Range Status   SARS Coronavirus 2 NEGATIVE NEGATIVE Final    Comment: (NOTE) SARS-CoV-2 target nucleic acids are NOT DETECTED. The SARS-CoV-2 RNA is generally detectable in upper and lower respiratory specimens during the acute phase of infection. Negative  results do not preclude SARS-CoV-2 infection, do not rule out co-infections with other pathogens, and should not be used as the sole basis for treatment or other patient management decisions. Negative results must be combined with clinical observations, patient history, and epidemiological information. The expected result is Negative. Fact Sheet for Patients: HairSlick.nohttps://www.fda.gov/media/138098/download Fact Sheet for Healthcare Providers: quierodirigir.comhttps://www.fda.gov/media/138095/download This test is not yet approved or cleared by the [place] FDA and  has been authorized for detection and/or diagnosis of SARS-CoV-2 by FDA under an Emergency Use Authorization (EUA). This EUA will remain  in effect (meaning this test can be used) for the duration of the COVID-19 declaration under Section 56 4(b)(1) of the Act, 21 U.S.C. section 360bbb-3(b)(1), unless the authorization is terminated or revoked sooner. Performed at [facility], [address] [place], [place] [address]          Radiology Studies: Dg Chest Port 1 View  Result Date: 01/28/2019 CLINICAL DATA:  [place].  Bacteremia due to enterococcus. EXAM: PORTABLE CHEST - 1 VIEW COMPARISON:  01/27/2019 FINDINGS: Worsening extensive bilateral somewhat nodular airspace opacities throughout both lungs, left worse than right. Heart size upper limits normal. Previous AVR. Sternotomy wires. Right arm PICC line placement to the distal SVC. No pneumothorax. Possible small left effusion. IMPRESSION: 1. Worsening bilateral infiltrates or edema. 2. PICC line to distal SVC. 3. Possible small left effusion. Electronically Signed   By: Corlis Leak  Hassell M.D.   On: 01/28/2019 14:33        Scheduled Meds: . apixaban  5 mg Oral BID  . Chlorhexidine Gluconate Cloth  6 each Topical Daily  . feeding supplement (PRO-STAT SUGAR FREE 64)  30 mL Per Tube TID  . FLUoxetine  10 mg Per Tube QHS  . free water  200 mL Per Tube Q6H  . levothyroxine  150 mcg Per  Tube Q0600  . mouth rinse  15 mL Mouth Rinse BID  . metoCLOPramide  10 mg Per Tube TID AC & HS  . metoprolol tartrate  25 mg Per Tube BID  . pantoprazole sodium  40 mg Per Tube Daily  . polyethylene glycol  17 g Per Tube Q M,W,F  .  rosuvastatin  5 mg Per Tube Daily  . tamsulosin  0.4 mg Oral Daily   Continuous Infusions: . ampicillin (OMNIPEN) IV 2 g (01/28/19 1313)  . cefTRIAXone (ROCEPHIN)  IV 2 g (01/28/19 1037)  . feeding supplement (OSMOLITE 1.5 CAL) 1,000 mL (01/28/19 1318)     LOS: 5 days     Cordelia Poche, MD [facility] 01/28/2019, 2:59 PM  If 7PM-7AM, please contact night-coverage [email or web address]

## 2019-01-28 NOTE — Evaluation (Signed)
Occupational Therapy Evaluation Patient Details Name: Audrey Collier MRN: [id number] DOB: [birth year] Today's Date: 01/28/2019    History of Present Illness 58yo female s/p aortic valve replacement complicated by need for intubation and PEG placement during recent admit August-October 2020. Now transferred back to [facility] and admitted for ESBL UTI, mitral valve endocarditis. PMH anxiety, developmental delay, aortic valve replacement   Clinical Impression   PTA patient at [facility], reporting spending most of her time in bed and needing assist for ADLs (although poor historian and only answering questions when given choices or yes/no questions).  Patient eval limited to bed level, pt declining bed mobility at this time.  Admitted for above and limited by cognition, generalized weakness, and decreased activity tolerance. Patient following 1 step commands with increased time, oriented to self, place and situation but not time, and requires constant cueing to attend to task.  Noted L UE edema, elevated on pillow to support and encouraged hand pumps throughout the day.  Engaged in [place] exercises as below.  Will benefit from further OT services while admitted and after dc returning to SNF level once medically appropriate.     Follow Up Recommendations  SNF;Supervision/Assistance - 24 hour    Equipment Recommendations  None recommended by OT    Recommendations for Other Services       Precautions / Restrictions Precautions Precautions: Fall;Other (comment) Precaution Comments: PEG, developmental delay Restrictions Weight Bearing Restrictions: No      Mobility Bed Mobility               General bed mobility comments: pt declined rolling in bed  Transfers                 General transfer comment: deferred    Balance                                           ADL either performed or assessed with clinical judgement   ADL Overall ADL's : Needs  assistance/impaired Eating/Feeding: Total assistance;Bed level Eating/Feeding Details (indicate cue type and reason): pt reports "I need help"  Grooming: Minimal assistance;Bed level   Upper Body Bathing: Maximal assistance;Bed level   Lower Body Bathing: Total assistance;Bed level   Upper Body Dressing : Total assistance;Bed level   Lower Body Dressing: +2 for physical assistance;Sit to/from Health and safety inspector Details (indicate cue type and reason): NT            General ADL Comments: limited eval, bed level only; pt with limited participation and declined mobility      Vision   Vision Assessment?: No apparent visual deficits     Perception     Praxis      Pertinent Vitals/Pain Pain Assessment: Faces Faces Pain Scale: No hurt Pain Intervention(s): Limited activity within patient's tolerance;Monitored during session     Hand Dominance Left   Extremity/Trunk Assessment Upper Extremity Assessment Upper Extremity Assessment: Generalized weakness;LUE deficits/detail;RUE deficits/detail RUE Deficits / Details: grossly 3/5 MMT  LUE Deficits / Details: increased edema, grossly 3-/5 MMT     Lower Extremity Assessment Lower Extremity Assessment: Defer to PT evaluation       Communication Communication Communication: Expressive difficulties(minimal verbalizations )   Cognition Arousal/Alertness: Awake/alert Behavior During Therapy: Flat affect Overall Cognitive Status: History of cognitive impairments - at baseline  General Comments: able to answer questions with given choices, follows 1 step commands with increased time, poor recall and sustained task attention    General Comments  VSS, bed level eval only    Exercises Exercises: General Upper Extremity;Hand exercises General Exercises - Upper Extremity Shoulder Flexion: AAROM;Both;5 reps;Supine Shoulder Extension: AAROM;Both;5 reps;Supine Elbow Flexion:  AAROM;Both;5 reps;Supine Elbow Extension: AAROM;Both;5 reps;Supine Digit Composite Flexion: AAROM;Both;5 reps;Supine Composite Extension: AAROM;Both;5 reps;Supine Hand Exercises Forearm Supination: AAROM;Both;5 reps;Supine Forearm Pronation: AAROM;Both;5 reps;Supine   Shoulder Instructions      Home Living Family/patient expects to be discharged to:: Skilled nursing facility                                 Additional Comments: from [facility]      Prior Functioning/Environment Level of Independence: Needs assistance  Gait / Transfers Assistance Needed: pt reports spending majority of her time at rehab in bed ADL's / Homemaking Assistance Needed: pt reports requiring assist with all ADLs    Comments: poor historian - per chart in 10/2018 (before surgery) was able to walk and needed some assist with ADLs         OT Problem List: Decreased strength;Decreased activity tolerance;Impaired balance (sitting and/or standing);Decreased coordination;Decreased cognition;Decreased knowledge of precautions;Decreased safety awareness;Decreased knowledge of use of DME or AE;Obesity;Increased edema      OT Treatment/Interventions: Self-care/ADL training;Therapeutic exercise;Therapeutic activities;Cognitive remediation/compensation;Patient/family education;Balance training;DME and/or AE instruction    OT Goals(Current goals can be found in the care plan section) Acute Rehab OT Goals Patient Stated Goal: to sleep OT Goal Formulation: With patient Time For Goal Achievement: 02/11/19 Potential to Achieve Goals: Fair  OT Frequency: Min 2X/week   Barriers to D/C:            Co-evaluation              AM-PAC OT "6 Clicks" Daily Activity     Outcome Measure Help from another person eating meals?: A Lot Help from another person taking care of personal grooming?: A Lot Help from another person toileting, which includes using toliet, bedpan, or urinal?: Total Help from  another person bathing (including washing, rinsing, drying)?: A Lot Help from another person to put on and taking off regular upper body clothing?: Total Help from another person to put on and taking off regular lower body clothing?: Total 6 Click Score: 9   End of Session Equipment Utilized During Treatment: Oxygen(3L) Nurse Communication: Mobility status  Activity Tolerance: Patient tolerated treatment well(limited by pt declining bed mobility ) Patient left: in bed;with call bell/phone within reach;with bed alarm set  OT Visit Diagnosis: Muscle weakness (generalized) (M62.81);Other abnormalities of gait and mobility (R26.89);Other symptoms and signs involving cognitive function;Cognitive communication deficit (R41.841)                Time: 4944-9675 OT Time Calculation (min): 17 min Charges:  OT General Charges $OT Visit: 1 Visit OT Evaluation $OT Eval Moderate Complexity: 1 Mod  Chancy Milroy, OT Acute Rehabilitation Services Pager [phone] Office [phone]   Chancy Milroy 01/28/2019, 2:16 PM

## 2019-01-28 NOTE — TOC Progression Note (Signed)
Transition of Care [facility]) - Progression Note    Patient Details  Name: Audrey Collier MRN: [id number] Date of Birth: 07/06/1960  Transition of Care [facility]) CM/SW [place], [place] Phone Number: [phone] 01/28/2019, 2:53 PM  Clinical Narrative:     CSW spoke to [place] patient's aunt regarding dc planning, she reports she would like to look at other SNF options and not [facility], she would like CSW to look into [place] first and then [facility] in [place]. CSW has faxed referral to [facility] pending bed offer at this time.   Expected Discharge Plan: [place] Barriers to Discharge: Continued Medical Work up  Expected Discharge Plan and Services Expected Discharge Plan: [place] In-house Referral: Clinical Social Work Discharge Planning Services: NA Post Acute Care Choice: [place] Living arrangements for the past 2 months: [place]                 DME Arranged: N/A DME Agency: NA       HH Arranged: NA HH Agency: NA         Social Determinants of Health (SDOH) Interventions    Readmission Risk Interventions No flowsheet data found.

## 2019-01-28 NOTE — Progress Notes (Signed)
She will need 6 weeks of IV ceftriaxone and ampicillin through March 05, 2019 and will then ideally be on suppressive amoxicillin due to the prosthetic valve ongoing after that with amoxicillin 500 mg twice a day, likely for life.   Persistent leukocytosis since September with little change.  Appears to be at her baseline.   Thayer Headings, MD

## 2019-01-29 ENCOUNTER — Inpatient Hospital Stay (HOSPITAL_COMMUNITY): Payer: Medicaid Other

## 2019-01-29 DIAGNOSIS — T826XXA Infection and inflammatory reaction due to cardiac valve prosthesis, initial encounter: Secondary | ICD-10-CM | POA: Diagnosis not present

## 2019-01-29 DIAGNOSIS — Z952 Presence of prosthetic heart valve: Secondary | ICD-10-CM | POA: Diagnosis not present

## 2019-01-29 DIAGNOSIS — I330 Acute and subacute infective endocarditis: Secondary | ICD-10-CM | POA: Diagnosis not present

## 2019-01-29 DIAGNOSIS — D72829 Elevated white blood cell count, unspecified: Secondary | ICD-10-CM | POA: Diagnosis not present

## 2019-01-29 LAB — GLUCOSE, CAPILLARY
Glucose-Capillary: 148 mg/dL — ABNORMAL HIGH (ref 70–99)
Glucose-Capillary: 118 mg/dL — ABNORMAL HIGH (ref 70–99)
Glucose-Capillary: 103 mg/dL — ABNORMAL HIGH (ref 70–99)
Glucose-Capillary: 87 mg/dL (ref 70–99)
Glucose-Capillary: 112 mg/dL — ABNORMAL HIGH (ref 70–99)

## 2019-01-29 NOTE — Progress Notes (Signed)
PROGRESS NOTE    Audrey Collier  UXN:[id number] DOB: 07/04/1960 DOA: 01/16/2019 PCP: Jani Gravel, MD   Brief Narrative: Audrey Collier is a 58 y.o. female with history of developmental delay, hypothyroidism who was recently admitted in August 2020 for fever was found to have MSSA bacteremia aortic valve vegetation. She presented secondary to positive blood cultures and was transferred to [facility] from [facility] hospital with evidence atrial valve endocarditis.   Assessment & Plan:   Principal Problem:   Bacteremia due to Enterococcus Active Problems:   S/P AVR (aortic valve replacement)   Nausea & vomiting   Bacteriuria, ESBL Klebsiella    Malnutrition of moderate degree   Enterococcus bacteremia Mitral/aortic endocarditis CT surgery evaluated and patient is not a candidate for surgery. Previously treated with vancomycin at OSH and now on ampicillin/ceftriaxone. Repeat blood cultures no growth to date. -ID recommendations: Continue ampicillin and ceftriaxone; 6 week total course (end date 03/05/2019) followed by life-long amoxicillin 500 mg BID  ESBL UTI Treated initially with one dose of Merrem, switched to Fosfomax for one dose and treatment discontinued per ID recommendations  Aortic stenosis S/p AV replacement CT surgery evaluated. Recommendation for cardiac CT. Cardiology recommending cardiac MRI as a better option but recommending to treat as presumed AV endocarditis and that it is unlikely a prosthetic valve thrombus.  Emesis Patient's aunt states she was told she possibly had gastroparesis. On chart review, mentioned as a possible etiology for vomiting from GI back in September. No formal workup performed. In setting of tube feeds. Patient seems to have persistent issues with this. She also has some abdominal distension today. -Zofran prn -Hold tube feeds for now -Abdominal x-ray -Continue Reglan  Acute on chronic respiratory failure with hypoxia Secondary to likely  aspiration from vomiting. -Continue oxygen  Leukocytosis In setting of bacteremia. Persistent. Concern for possible aspiration as this has been an issue in the past. Chest x-ray suggests possible infiltration. -Flagyl x5 days  Normocytic anemia Stable.  History of pulmonary embolism -Continue Eliquis  Thrombocytopenia Thought to be secondary to sepsis  AKI on CKD stage IIIb Baseline creatinine appears to have been about 3.5. improved from recent baseline  Malnutrition Patient presented with PEG tube -Continue tube feeds  Depression -Continue Prozac  Hyperlipidemia -Continue Crestor  Hypothyroidism -Continue Synthroid 125 mcg daily   DVT prophylaxis: Eliquis Code Status:   Code Status: Full Code Family Communication: Called daughter, no answer Disposition Plan: Discharge pending placement vs transfer back to [place] per family request   Consultants:   Infectious disease  Cardiology  Cardiothoracic surgery  Procedures:   11/23: Transthoracic Echocardiogram IMPRESSIONS    1. Highly suggestive of multiple valve endocarditis. Mitral valve with moderate MR, visible vegetation on anterior leaflet not previously seen, and images suggest involvement of posterior leaflet as well. Bioprosthetic aortc valve leaflets appear  thickened, restricted, with severe AS by gradients. There may also be a vegetation on the ventricular aspect of the aortic valve. There is a linear echodensity in RVOT, cannot see where it attaches, cannot exclude pulmonic valve endocarditis. Recommend  TEE for further evaluation.  2. Left ventricular ejection fraction, by visual estimation, is 50 to 55%. The left ventricle has low normal function. There is mildly increased left ventricular hypertrophy.  3. Left ventricular diastolic parameters are consistent with Grade II diastolic dysfunction (pseudonormalization).  4. The left ventricle has no regional wall motion abnormalities.  5.  Global right ventricle has mildly reduced systolic function.The right ventricular size is  normal. Right vetricular wall thickness was not assessed.  6. Left atrial size was severely dilated.  7. Right atrial size was mildly dilated.  8. Small pericardial effusion.  9. Moderate vegetation on the mitral valve. 10. The mitral valve is abnormal. Moderate mitral valve regurgitation. 11. New mobile echodensity on anterior mitral valve leaflet, on atrial aspect of leaflet. Prior TEE images reviewed, not previously visualized. On image 14, there also may be additional involvement of posterior leaflet. 12. The tricuspid valve is grossly normal. Tricuspid valve regurgitation moderate-severe. 13. Aortic valve regurgitation is trivial. Severe aortic valve stenosis. 14. 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 There appears to be an echodensity seen (see image 11) on ventricular side of valve. No significant regurgitation. Leaflets appear thickened and do not open fully. Increased valve gradient,  mean gradient 43 mmHg. Recommend further evaluation with TEE. 15. The pulmonic valve was grossly normal. Pulmonic valve regurgitation is mild to moderate. 16. There is a linear echodenisity seen in the RVOT, cannot clearly see to where it attaches. Cannot exclude pulmonary valve endocarditis. 17. The aortic root was not well visualized. 18. Moderately elevated pulmonary artery systolic pressure. 19. The tricuspid regurgitant velocity is 3.23 m/s, and with an assumed right atrial pressure of 8 mmHg, the estimated right ventricular systolic pressure is moderately elevated at 49.6 mmHg. 20. The inferior vena cava is normal in size with <50% respiratory variability, suggesting right atrial pressure of 8 mmHg. 21. The interatrial septum was not well visualized.    11/25: Transesophageal Echocardiogram  IMPRESSIONS    1. Left ventricular ejection fraction, by visual estimation, is 50 to 55%. The  left ventricle has normal function. Normal left ventricular size. There is no left ventricular hypertrophy.  2. Global right ventricle has normal systolic function.The right ventricular size is normal. No increase in right ventricular wall thickness.  3. Left atrial size was moderately dilated.  4. Right atrial size was normal.  5. The mitral valve is normal in structure. Moderate mitral valve regurgitation. No evidence of mitral stenosis.  6. Mobile vegetation (1.5cm x 0.5 cm) on the P3 segment of the mitral valve. 3D mitral valve images were obtained.  7. The tricuspid valve is normal in structure. Tricuspid valve regurgitation moderate.  8. Aortic valve mean gradient measures 35.0 mmHg.  9. Aortic valve peak gradient measures 63.4 mmHg. 10. Aortic valve regurgitation is not visualized. Moderate to severe aortic valve stenosis. 11. There is a mobile 1.3 cm x 0.9 cm vegetation on the aortic aspect of the bioprosthetic aortic valve. Acceleration time 98 ms. DI 0.26. 3D images were obtained. 12. The pulmonic valve was normal in structure. Pulmonic valve regurgitation is not visualized.  Antimicrobials:  Merrem  Fosfomycin  Ampicillin  Ceftriaxone    Subjective: Emesis today. She reports no abdominal pain. She reports nausea.  Objective: Vitals:   01/28/19 2017 01/28/19 2200 01/29/19 0013 01/29/19 0458  BP: 107/70 106/75 (!) 96/50 112/70  Pulse: (!) 109 (!) 110  100  Resp: (!) 21 (!) 27 (!) 21 (!) 22  Temp: 98.2 F (36.8 C)  (!) 97.1 F (36.2 C) 97.6 F (36.4 C)  TempSrc: Oral  Oral Oral  SpO2: 91% 93% 92% 94%  Weight:      Height:        Intake/Output Summary (Last 24 hours) at 01/29/2019 0730 Last data filed at 01/29/2019 0700 Gross per 24 hour  Intake 340 ml  Output 601 ml  Net -261 ml  Filed Weights   01/26/19 0500 01/27/19 0448 01/28/19 0420  Weight: 65.6 kg 68.1 kg 69.5 kg    Examination:  General exam: Appears calm and comfortable Respiratory system:  Clear to auscultation. Respiratory effort normal. Cardiovascular system: S1 & S2 heard, RRR. No murmurs, rubs, gallops or clicks. Gastrointestinal system: Abdomen is distended, soft and nontender. No organomegaly or masses felt. Normal bowel sounds heard. Central nervous system: Alert. Extremities: No edema. No calf tenderness Skin: No cyanosis. No rashes Psychiatry: Judgement and insight appear normal. Mood & affect appropriate.    Data Reviewed: I have personally reviewed following labs and imaging studies  CBC: Recent Labs  Lab Feb 10, 2019 0324 Feb 10, 2019 1959 01/24/19 0312 01/28/19 0455  WBC 17.5*  --  15.3* 16.3*  NEUTROABS 13.9*  --  11.6*  --   HGB 7.1* 8.7* 8.8* 8.6*  HCT 22.9* 28.6* 28.0* 28.9*  MCV 89.5  --  84.6 88.4  PLT 148*  --  116* 90*   Basic Metabolic Panel: Recent Labs  Lab 02/10/2019 0324 01/24/19 0312 01/24/19 1431 01/13/2019 0521 01/07/2019 1647 01/26/19 0252  NA 138 141  --  139  --  143  K 3.8 3.8  --  3.4*  --  3.4*  CL 105 109  --  105  --  109  CO2 24 24  --  23  --  24  GLUCOSE 114* 93  --  97  --  123*  BUN 36* 30*  --  31*  --  33*  CREATININE 2.01* 1.44*  --  1.24*  --  1.16*  CALCIUM 8.7* 8.9  --  8.5*  --  8.6*  MG  --   --  1.9 1.8 1.8  --   PHOS  --   --  4.2 4.2 4.5 4.5   GFR: Estimated Creatinine Clearance: 48.9 mL/min (A) (by C-G formula based on SCr of 1.16 mg/dL (H)). Liver Function Tests: Recent Labs  Lab 2019-02-10 0324 01/27/2019 0521 01/26/19 0252  AST 38 52*  --   ALT 24 38  --   ALKPHOS 166* 291*  --   BILITOT 0.7 0.3  --   PROT 5.0* 4.7*  --   ALBUMIN 2.0* 1.8* 1.8*   No results for input(s): LIPASE, AMYLASE in the last 168 hours. No results for input(s): AMMONIA in the last 168 hours. Coagulation Profile: No results for input(s): INR, PROTIME in the last 168 hours. Cardiac Enzymes: No results for input(s): CKTOTAL, CKMB, CKMBINDEX, TROPONINI in the last 168 hours. BNP (last 3 results) No results for input(s): PROBNP  in the last 8760 hours. HbA1C: No results for input(s): HGBA1C in the last 72 hours. CBG: Recent Labs  Lab 01/28/19 1110 01/28/19 1701 01/28/19 2023 01/28/19 2355 01/29/19 0422  GLUCAP 121* 122* 102* 139* 148*   Lipid Profile: No results for input(s): CHOL, HDL, LDLCALC, TRIG, CHOLHDL, LDLDIRECT in the last 72 hours. Thyroid Function Tests: No results for input(s): TSH, T4TOTAL, FREET4, T3FREE, THYROIDAB in the last 72 hours. Anemia Panel: No results for input(s): VITAMINB12, FOLATE, FERRITIN, TIBC, IRON, RETICCTPCT in the last 72 hours. Sepsis Labs: No results for input(s): PROCALCITON, LATICACIDVEN in the last 168 hours.  Recent Results (from the past 240 hour(s))  MRSA PCR Screening     Status: None   Collection Time: 2019-02-10  2:53 AM   Specimen: Nasopharyngeal  Result Value Ref Range Status   MRSA by PCR NEGATIVE NEGATIVE Final    Comment:  The GeneXpert MRSA Assay (FDA approved for NASAL specimens only), is one component of a comprehensive MRSA colonization surveillance program. It is not intended to diagnose MRSA infection nor to guide or monitor treatment for MRSA infections. Performed at [facility], [address], [place], [place] [address]   Culture, blood (routine x 2)     Status: None   Collection Time: 02-06-19  3:32 AM   Specimen: BLOOD  Result Value Ref Range Status   Specimen Description BLOOD LEFT ANTECUBITAL  Final   Special Requests   Final    BOTTLES DRAWN AEROBIC ONLY Blood Culture adequate volume   Culture   Final    NO GROWTH 5 DAYS Performed at [facility], [address], [place], [place] [address]    Report Status 01/28/2019 FINAL  Final  Culture, blood (routine x 2)     Status: None   Collection Time: Feb 06, 2019  3:36 AM   Specimen: BLOOD  Result Value Ref Range Status   Specimen Description BLOOD RIGHT ANTECUBITAL  Final   Special Requests   Final    BOTTLES DRAWN AEROBIC ONLY Blood Culture adequate volume    Culture   Final    NO GROWTH 5 DAYS Performed at [facility], [address], [place], [place] [address]    Report Status 01/28/2019 FINAL  Final  SARS CORONAVIRUS 2 (TAT 6-24 HRS) Nasopharyngeal Nasopharyngeal Swab     Status: None   Collection Time: 02-06-19  5:35 AM   Specimen: Nasopharyngeal Swab  Result Value Ref Range Status   SARS Coronavirus 2 NEGATIVE NEGATIVE Final    Comment: (NOTE) SARS-CoV-2 target nucleic acids are NOT DETECTED. The SARS-CoV-2 RNA is generally detectable in upper and lower respiratory specimens during the acute phase of infection. Negative results do not preclude SARS-CoV-2 infection, do not rule out co-infections with other pathogens, and should not be used as the sole basis for treatment or other patient management decisions. Negative results must be combined with clinical observations, patient history, and epidemiological information. The expected result is Negative. Fact Sheet for Patients: HairSlick.no Fact Sheet for Healthcare Providers: quierodirigir.com This test is not yet approved or cleared by the [place] FDA and  has been authorized for detection and/or diagnosis of SARS-CoV-2 by FDA under an Emergency Use Authorization (EUA). This EUA will remain  in effect (meaning this test can be used) for the duration of the COVID-19 declaration under Section 56 4(b)(1) of the Act, 21 U.S.C. section 360bbb-3(b)(1), unless the authorization is terminated or revoked sooner. Performed at [facility], [address], [place], [place] [address]          Radiology Studies: Dg Chest Port 1 View  Result Date: 01/28/2019 CLINICAL DATA:  [place].  Bacteremia due to enterococcus. EXAM: PORTABLE CHEST - 1 VIEW COMPARISON:  2019/02/06 FINDINGS: Worsening extensive bilateral somewhat nodular airspace opacities throughout both lungs, left worse than right. Heart size upper limits  normal. Previous AVR. Sternotomy wires. Right arm PICC line placement to the distal SVC. No pneumothorax. Possible small left effusion. IMPRESSION: 1. Worsening bilateral infiltrates or edema. 2. PICC line to distal SVC. 3. Possible small left effusion. Electronically Signed   By: Corlis Leak M.D.   On: 01/28/2019 14:33        Scheduled Meds: . apixaban  5 mg Oral BID  . Chlorhexidine Gluconate Cloth  6 each Topical Daily  . feeding supplement (PRO-STAT SUGAR FREE 64)  30 mL  Per Tube TID  . FLUoxetine  10 mg Per Tube QHS  . free water  200 mL Per Tube Q6H  . levothyroxine  150 mcg Per Tube Q0600  . mouth rinse  15 mL Mouth Rinse BID  . metoCLOPramide  10 mg Per Tube TID AC & HS  . metoprolol tartrate  25 mg Per Tube BID  . metroNIDAZOLE  500 mg Oral Q8H  . pantoprazole sodium  40 mg Per Tube Daily  . polyethylene glycol  17 g Per Tube Q M,W,F  . rosuvastatin  5 mg Per Tube Daily  . tamsulosin  0.4 mg Oral Daily   Continuous Infusions: . ampicillin (OMNIPEN) IV 2 g (01/29/19 0655)  . cefTRIAXone (ROCEPHIN)  IV 2 g (01/28/19 2147)  . feeding supplement (OSMOLITE 1.5 CAL) 1,000 mL (01/28/19 1318)     LOS: 6 days     Jacquelin Hawking, MD [facility] 01/29/2019, 7:30 AM  If 7PM-7AM, please contact night-coverage [email or web address]

## 2019-01-29 NOTE — Progress Notes (Signed)
Talked to pt's [name]. Updated on pt's condition. Per Aunt pt is not getting enough continuity of care here at [facility] and has been here too long last admission, and suffered a lot. Stated she wants pt moved to [place] as soon as possible. Aspiration is her major concern. Stated I will pass it on to oncoming shift. Will continue to monitor.

## 2019-01-29 NOTE — Plan of Care (Signed)
Poc progressing.  

## 2019-01-29 NOTE — Progress Notes (Signed)
Pt vomited twice right after the night and morning med pass. The night meds were given via the peg tube and pt throw up. I switched this morning and crushed them in pudding she throw up again and c/o headache right after she throw up at both occasion.[name]

## 2019-01-29 NOTE — Progress Notes (Signed)
[facility]   Reason for visit: Follow up on IE  Interval History: no complaints, no fever, no new issues.    Physical Exam: Constitutional:  Vitals:   01/29/19 0824 01/29/19 1200  BP: 114/69 111/72  Pulse: (!) 110 96  Resp: 20 (!) 22  Temp: 98.6 F (37 C) 98.5 F (36.9 C)  SpO2: 93% 96%   patient appears in NAD, does not complain of anything Eyes: anicteric HENT: no thrush Respiratory: Normal respiratory effort; CTA B Cardiovascular: RRR GI: soft, nt, nd  Review of Systems: Constitutional: negative for fevers and chills Gastrointestinal: negative for nausea Integument/breast: negative for rash  Lab Results  Component Value Date   WBC 16.3 (H) 01/28/2019   HGB 8.6 (L) 01/28/2019   HCT 28.9 (L) 01/28/2019   MCV 88.4 01/28/2019   PLT 90 (L) 01/28/2019    Lab Results  Component Value Date   CREATININE 1.16 (H) 01/26/2019   BUN 33 (H) 01/26/2019   NA 143 01/26/2019   K 3.4 (L) 01/26/2019   CL 109 01/26/2019   CO2 24 01/26/2019    Lab Results  Component Value Date   ALT 38 01/24/2019   AST 52 (H) 01/26/2019   ALKPHOS 291 (H) 01/11/2019     Microbiology: Recent Results (from the past 240 hour(s))  MRSA PCR Screening     Status: None   Collection Time: 01/02/2019  2:53 AM   Specimen: Nasopharyngeal  Result Value Ref Range Status   MRSA by PCR NEGATIVE NEGATIVE Final    Comment:        The GeneXpert MRSA Assay (FDA approved for NASAL specimens only), is one component of a comprehensive MRSA colonization surveillance program. It is not intended to diagnose MRSA infection nor to guide or monitor treatment for MRSA infections. Performed at [facility], [place] [address] [place], [place] [address]   Culture, blood (routine x 2)     Status: None   Collection Time: 01/10/2019  3:32 AM   Specimen: BLOOD  Result Value Ref Range Status   Specimen Description BLOOD LEFT ANTECUBITAL  Final   Special Requests   Final   BOTTLES DRAWN AEROBIC ONLY Blood Culture adequate volume   Culture   Final    NO GROWTH 5 DAYS Performed at [facility], [place] [address], [place], [place] [address]    Report Status 01/28/2019 FINAL  Final  Culture, blood (routine x 2)     Status: None   Collection Time: 01/13/2019  3:36 AM   Specimen: BLOOD  Result Value Ref Range Status   Specimen Description BLOOD RIGHT ANTECUBITAL  Final   Special Requests   Final    BOTTLES DRAWN AEROBIC ONLY Blood Culture adequate volume   Culture   Final    NO GROWTH 5 DAYS Performed at [facility], [place] [address] [place], [place] [address]    Report Status 01/28/2019 FINAL  Final  SARS CORONAVIRUS 2 (TAT 6-24 HRS) Nasopharyngeal Nasopharyngeal Swab     Status: None   Collection Time: 01/29/2019  5:35 AM   Specimen: Nasopharyngeal Swab  Result Value Ref Range Status   SARS Coronavirus 2 NEGATIVE NEGATIVE Final    Comment: (NOTE) SARS-CoV-2 target nucleic acids are NOT DETECTED. The SARS-CoV-2 RNA is generally detectable in upper and lower respiratory specimens during the acute phase of infection. Negative results do not preclude SARS-CoV-2 infection, do not rule out co-infections with other pathogens, and  should not be used as the sole basis for treatment or other patient management decisions. Negative results must be combined with clinical observations, patient history, and epidemiological information. The expected result is Negative. Fact Sheet for Patients: HairSlick.no Fact Sheet for Healthcare Providers: quierodirigir.com This test is not yet approved or cleared by the [place] FDA and  has been authorized for detection and/or diagnosis of SARS-CoV-2 by FDA under an Emergency Use Authorization (EUA). This EUA will remain  in effect (meaning this test can be used) for the duration of the COVID-19 declaration under Section 56 4(b)(1) of the Act, 21 U.S.C. section  360bbb-3(b)(1), unless the authorization is terminated or revoked sooner. Performed at [facility], [address] [place], [place] [address]     Impression/Plan:  1. 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  Felt by Dr. Cliffton Asters that it may be a clot, though cardiology more convinced it is a vegetation.  Regardless, she should continue with IV ceftriaxone + ampicillin through March 05, 2019 for presumed infection and due to being a prosthetic valve infection, I would favor continuing with oral amoxicllin 500 mg twice a day indefinitely unless it is later felt to be more c/w a clot.   2.  Leukocytosis - this is persistent and similar to her previous values.  No further work up indicated.  3.  Disposition - to SNF.    We are happy to follow up with her as an outpatient but it sounds like she will be going back to [place] and likely get follow up there closer to home.    I will sign off, call with any questions.

## 2019-01-30 ENCOUNTER — Inpatient Hospital Stay (HOSPITAL_COMMUNITY): Payer: Medicaid Other

## 2019-01-30 DIAGNOSIS — R7881 Bacteremia: Secondary | ICD-10-CM | POA: Diagnosis not present

## 2019-01-30 DIAGNOSIS — I058 Other rheumatic mitral valve diseases: Secondary | ICD-10-CM

## 2019-01-30 DIAGNOSIS — I5033 Acute on chronic diastolic (congestive) heart failure: Secondary | ICD-10-CM

## 2019-01-30 DIAGNOSIS — Z952 Presence of prosthetic heart valve: Secondary | ICD-10-CM | POA: Diagnosis not present

## 2019-01-30 DIAGNOSIS — I358 Other nonrheumatic aortic valve disorders: Secondary | ICD-10-CM | POA: Diagnosis not present

## 2019-01-30 LAB — BASIC METABOLIC PANEL
Anion gap: 11 (ref 5–15)
BUN: 32 mg/dL — ABNORMAL HIGH (ref 6–20)
CO2: 27 mmol/L (ref 22–32)
Calcium: 8.8 mg/dL — ABNORMAL LOW (ref 8.9–10.3)
Chloride: 103 mmol/L (ref 98–111)
Creatinine, Ser: 0.8 mg/dL (ref 0.44–1.00)
GFR calc Af Amer: >60 mL/min (ref >60)
GFR calc non Af Amer: >60 mL/min (ref >60)
Glucose, Bld: 99 mg/dL (ref 70–99)
Potassium: 5 mmol/L (ref 3.5–5.1)
Sodium: 141 mmol/L (ref 135–145)

## 2019-01-30 LAB — GLUCOSE, CAPILLARY
Glucose-Capillary: 100 mg/dL — ABNORMAL HIGH (ref 70–99)
Glucose-Capillary: 104 mg/dL — ABNORMAL HIGH (ref 70–99)
Glucose-Capillary: 120 mg/dL — ABNORMAL HIGH (ref 70–99)
Glucose-Capillary: 76 mg/dL (ref 70–99)
Glucose-Capillary: 87 mg/dL (ref 70–99)
Glucose-Capillary: 90 mg/dL (ref 70–99)
Glucose-Capillary: 95 mg/dL (ref 70–99)

## 2019-01-30 MED ORDER — FREE WATER
100.0000 mL | Freq: Four times a day (QID) | Status: DC
  Administered 2019-01-30: 22:00:00 50 mL
  Administered 2019-01-30: 100 mL
  Administered 2019-01-31 (×3): 50 mL
  Administered 2019-01-31 – 2019-02-02 (×6): 100 mL
  Administered 2019-02-03: 50 mL
  Administered 2019-02-03 – 2019-02-04 (×4): 100 mL

## 2019-01-30 MED ORDER — FUROSEMIDE 10 MG/ML IJ SOLN
40.0000 mg | Freq: Two times a day (BID) | INTRAMUSCULAR | Status: DC
  Administered 2019-01-30 – 2019-02-04 (×12): 40 mg via INTRAVENOUS
  Filled 2019-01-30 (×12): qty 4

## 2019-01-30 NOTE — Progress Notes (Signed)
Progress Note  Patient Name: Audrey Collier Date of Encounter: 01/30/2019  Primary Cardiologist: Fransico Him, MD  Subjective   Resting comfortably in bed, denies pain or complaints.  Inpatient Medications    Scheduled Meds: . apixaban  5 mg Oral BID  . Chlorhexidine Gluconate Cloth  6 each Topical Daily  . feeding supplement (PRO-STAT SUGAR FREE 64)  30 mL Per Tube TID  . FLUoxetine  10 mg Per Tube QHS  . free water  100 mL Per Tube Q6H  . furosemide  40 mg Intravenous BID  . levothyroxine  150 mcg Per Tube Q0600  . mouth rinse  15 mL Mouth Rinse BID  . metoCLOPramide  10 mg Per Tube TID AC & HS  . metoprolol tartrate  25 mg Per Tube BID  . metroNIDAZOLE  500 mg Oral Q8H  . pantoprazole sodium  40 mg Per Tube Daily  . polyethylene glycol  17 g Per Tube Q M,W,F  . rosuvastatin  5 mg Per Tube Daily  . tamsulosin  0.4 mg Oral Daily   Continuous Infusions: . ampicillin (OMNIPEN) IV 2 g (01/30/19 1227)  . cefTRIAXone (ROCEPHIN)  IV 2 g (01/30/19 0934)  . feeding supplement (OSMOLITE 1.5 CAL) 1,000 mL (01/29/19 1531)   PRN Meds: acetaminophen **OR** acetaminophen, ondansetron **OR** ondansetron (ZOFRAN) IV, sodium chloride flush   Vital Signs    Vitals:   01/30/19 1134 01/30/19 1215 01/30/19 1655 01/30/19 1712  BP: 117/75  100/65   Pulse: (!) 105     Resp: (!) 30 (!) 26 20 (!) 23  Temp: 97.8 F (36.6 C)     TempSrc: Oral     SpO2: 95% 93% 95% 94%  Weight:      Height:        Intake/Output Summary (Last 24 hours) at 01/30/2019 1722 Last data filed at 01/30/2019 1226 Gross per 24 hour  Intake 450 ml  Output 1500 ml  Net -1050 ml   Last 3 Weights 01/30/2019 01/28/2019 01/27/2019  Weight (lbs) 159 lb 6.3 oz 153 lb 3.5 oz 150 lb 2.1 oz  Weight (kg) 72.3 kg 69.5 kg 68.1 kg      Telemetry    Sinus rhythm/sinus tachycardia just at or below 100 bpm - Personally Reviewed  ECG    No new since 11/23 - Personally Reviewed  Physical Exam   GEN: No acute  distress.   Neck: No JVD Cardiac: RRR, no rubs. Has 2/6 SEM at upper sternal border and 3/6 SM at lower left sternal border. Does have S3 gallop.  Respiratory: Clear to auscultation bilaterally in upper anterior fields, diminished at bilateral bases. GI: Soft, nontender, non-distended  MS: diffuse upper and lower extremity edema. Neuro:  Nonfocal  Psych: appears calm, comfortable  Labs    High Sensitivity Troponin:  No results for input(s): TROPONINIHS in the last 720 hours.    Chemistry Recent Labs  Lab 23-Feb-2019 0521 01/26/19 0252 01/30/19 0500  NA 139 143 141  K 3.4* 3.4* 5.0  CL 105 109 103  CO2 23 24 27   GLUCOSE 97 123* 99  BUN 31* 33* 32*  CREATININE 1.24* 1.16* 0.80  CALCIUM 8.5* 8.6* 8.8*  PROT 4.7*  --   --   ALBUMIN 1.8* 1.8*  --   AST 52*  --   --   ALT 38  --   --   ALKPHOS 291*  --   --   BILITOT 0.3  --   --  GFRNONAA 48* 52* >60  GFRAA 56* >60 >60  ANIONGAP 11 10 11      Hematology Recent Labs  Lab May 17, 2018 1959 01/24/19 0312 01/28/19 0455  WBC  --  15.3* 16.3*  RBC  --  3.31* 3.27*  HGB 8.7* 8.8* 8.6*  HCT 28.6* 28.0* 28.9*  MCV  --  84.6 88.4  MCH  --  26.6 26.3  MCHC  --  31.4 29.8*  RDW  --  21.2* 19.3*  PLT  --  116* 90*    BNPNo results for input(s): BNP, PROBNP in the last 168 hours.   DDimer No results for input(s): DDIMER in the last 168 hours.   Radiology    Dg Chest Port 1 View  Result Date: 01/30/2019 CLINICAL DATA:  58 year old female history of acute respiratory failure with hypoxia. EXAM: PORTABLE CHEST 1 VIEW COMPARISON:  Chest x-ray 01/28/2019. FINDINGS: There is a right upper extremity PICC with tip terminating in the superior cavoatrial junction. Patchy multifocal airspace consolidation and areas of interstitial prominence scattered throughout the lungs bilaterally (left greater than right), with worsening aeration compared to the prior study. Enlarging small to moderate left and small right pleural effusions. Heart size  is mildly enlarged. Pulmonary vasculature is obscured. The patient is rotated to the right on today's exam, resulting in distortion of the mediastinal contours and reduced diagnostic sensitivity and specificity for mediastinal pathology. IMPRESSION: 1. Support apparatus, as above. 2. Worsening aeration throughout the lungs bilaterally. Given the asymmetry of the findings, this is favored to reflect worsening multilobar bilateral pneumonia, although the possibility of superimposed pulmonary edema is not excluded, particularly in light of the bilateral pleural effusions and cardiomegaly. Electronically Signed   By: Trudie Reedaniel  Entrikin M.D.   On: 01/30/2019 08:01   Dg Abd Portable 1v  Result Date: 01/29/2019 CLINICAL DATA:  Abdominal distension, emesis. EXAM: PORTABLE ABDOMEN - 1 VIEW COMPARISON:  None. FINDINGS: Gastrostomy tube in overlying the central abdomen. Gaseous distention of the stomach. Overall bowel gas pattern appears otherwise grossly nonobstructive. No evidence of free intraperitoneal air. IMPRESSION: 1. Gaseous distention of the stomach. Bowel gas pattern appears otherwise grossly nonobstructive. 2. Gastrostomy tube in the central abdomen. Electronically Signed   By: Bary RichardStan  Maynard M.D.   On: 01/29/2019 09:15    Cardiac Studies   TTE March 17, 202020: Impressions: 1. Highly suggestive of multiple valve endocarditis. Mitral valve with moderate MR, visible vegetation on anterior leaflet not previously seen, and images suggest involvement of posterior leaflet as well. Bioprosthetic aortc valve leaflets appear  thickened, restricted, with severe AS by gradients. There may also be a vegetation on the ventricular aspect of the aortic valve. There is a linear echodensity in RVOT, cannot see where it attaches, cannot exclude pulmonic valve endocarditis. Recommend  TEE for further evaluation. 2. Left ventricular ejection fraction, by visual estimation, is 50 to 55%. The left ventricle has low normal  function. There is mildly increased left ventricular hypertrophy. 3. Left ventricular diastolic parameters are consistent with Grade II diastolic dysfunction (pseudonormalization). 4. The left ventricle has no regional wall motion abnormalities. 5. Global right ventricle has mildly reduced systolic function.The right ventricular size is normal. Right vetricular wall thickness was not assessed. 6. Left atrial size was severely dilated. 7. Right atrial size was mildly dilated. 8. Small pericardial effusion. 9. Moderate vegetation on the mitral valve. 10. The mitral valve is abnormal. Moderate mitral valve regurgitation. 11. New mobile echodensity on anterior mitral valve leaflet, on atrial aspect of leaflet. Prior TEE  images reviewed, not previously visualized. On image 14, there also may be additional involvement of posterior leaflet. 12. The tricuspid valve is grossly normal. Tricuspid valve regurgitation moderate-severe. 13. Aortic valve regurgitation is trivial. Severe aortic valve stenosis. 14. 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 There appears to be an echodensity seen (see image 11) on ventricular side of valve. No significant regurgitation. Leaflets appear thickened and do not open fully. Increased valve gradient, mean gradient 43 mmHg. Recommend further evaluation with TEE. 15. The pulmonic valve was grossly normal. Pulmonic valve regurgitation is mild to moderate. 16. There is a linear echodenisity seen in the RVOT, cannot clearly see to where it attaches. Cannot exclude pulmonary valve endocarditis. 17. The aortic root was not well visualized. 18. Moderately elevated pulmonary artery systolic pressure. 19. The tricuspid regurgitant velocity is 3.23 m/s, and with an assumed right atrial pressure of 8 mmHg, the estimated right ventricular systolic pressure is moderately elevated at 49.6 mmHg. 20. The inferior vena cava is normal in size with <50% respiratory  variability, suggesting right atrial pressure of 8 mmHg. 21. The interatrial septum was not well visualized. _______________  TEE 01/15/2019: Impressions: 1. Left ventricular ejection fraction, by visual estimation, is 50 to 55%. The left ventricle has normal function. Normal left ventricular size. There is no left ventricular hypertrophy. 2. Global right ventricle has normal systolic function.The right ventricular size is normal. No increase in right ventricular wall thickness. 3. Left atrial size was moderately dilated. 4. Right atrial size was normal. 5. The mitral valve is normal in structure. Moderate mitral valve regurgitation. No evidence of mitral stenosis. 6. Mobile vegetation (1.5cm x 0.5 cm) on the P3 segment of the mitral valve. 3D mitral valve images were obtained. 7. The tricuspid valve is normal in structure. Tricuspid valve regurgitation moderate. 8. Aortic valve mean gradient measures 35.0 mmHg. 9. Aortic valve peak gradient measures 63.4 mmHg. 10. Aortic valve regurgitation is not visualized. Moderate to severe aortic valve stenosis. 11. There is a mobile 1.3 cm x 0.9 cm vegetation on the aortic aspect of the bioprosthetic aortic valve. Acceleration time 98 ms. DI 0.26. 3D images were obtained. 12. The pulmonic valve was normal in structure. Pulmonic valve regurgitation is not visualized.  Patient Profile     58 y.o. female with developmental delay, prior endocarditis s/p AVR in 10/2018 admitted with fever. 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 See full report above. - Continue IV antibiotics per primary team. - appreciated CVTS input, not a candidate for surgery at this time  Aortic Stenosis  -personally reviewed images. Thickening of leaflets suggests endocarditis involvement, and with uninterrupted anticoagulation, low suspicion that this is bioprosthetic valve thrombus. Would treat for endocarditis, re-evaluate gradient at outpatient follow up.  Edema: weight up from 64.6 kg on admission to 72.3 kg today. Diffuse edema. Renal function has improved. Can continue with gentle diuresis, but with albumin of 1.8 I suspect that her nutrition is also playing a role in her edema.   [facility] will sign off.   Medication Recommendations:  Continue apixaban Remain off of prior to admission amlodipine given borderline hypotension Continue aspirin for bioprosthetic valve (held, would restart) Continue metoprolol in place of prior to admission carvedilol Transition from  IV to oral furosemide when able Continue rosuvastatin Other recommendations (labs, testing, etc):  Repeat echo at outpatient follow up to assess AoV gradient Follow up as an outpatient:  We will arrange outpatient follow up with Dr. Mayford Knife or a member of her team.  For questions or updates, please contact [facility] Please consult [email or web address] for contact info under     Signed, Jodelle Red, MD  01/30/2019, 5:22 PM

## 2019-01-30 NOTE — Progress Notes (Signed)
PROGRESS NOTE    Audrey Collier  RXV:[id number] DOB: 11-21-1960 DOA: 2019/02/05 PCP: Jani Gravel, MD   Brief Narrative: Audrey Collier is a 58 y.o. female with history of developmental delay, hypothyroidism who was recently admitted in August 2020 for fever was found to have MSSA bacteremia aortic valve vegetation. She presented secondary to positive blood cultures and was transferred to [facility] from [facility] hospital with evidence atrial valve endocarditis.   Assessment & Plan:   Principal Problem:   Bacteremia due to Enterococcus Active Problems:   S/P AVR (aortic valve replacement)   Nausea & vomiting   Bacteriuria, ESBL Klebsiella    Malnutrition of moderate degree   Enterococcus bacteremia Mitral/aortic endocarditis CT surgery evaluated and patient is not a candidate for surgery. Previously treated with vancomycin at OSH and now on ampicillin/ceftriaxone. Repeat blood cultures no growth to date. -ID recommendations: Continue ampicillin and ceftriaxone; 6 week total course (end date 03/05/2019) followed by life-long amoxicillin 500 mg BID  ESBL UTI Treated initially with one dose of Merrem, switched to Fosfomax for one dose and treatment discontinued per ID recommendations  Aortic stenosis S/p AV replacement CT surgery evaluated. Recommendation for cardiac CT. Cardiology recommending cardiac MRI as a better option but recommending to treat as presumed AV endocarditis and that it is unlikely a prosthetic valve thrombus.  Emesis Patient's aunt states she was told she possibly had gastroparesis. On chart review, mentioned as a possible etiology for vomiting from GI back in September. No formal workup performed. In setting of tube feeds. Patient seems to have persistent issues with this. She also has some abdominal distension today. -Zofran prn -Hold tube feeds for now -Abdominal x-ray -Continue Reglan  Acute on chronic diastolic heart failure Grade II dysfunction. In/out do not  appear to be accurate. Patient now with worsening hypoxia and peripheral edema. Chest x-ray obtained today and significant for worsening infiltrates that could represent infection but I am leaning more towards pulmonary edema. Weight is up significantly from admission at least 17 lbs. -Lasix 40 mg IV BID -Strict inputs/urine output and daily weights  Acute on chronic respiratory failure with hypoxia Secondary to likely aspiration from vomiting but now appears to be in setting of fluid overload. -Continue oxygen -Diurese as mentioned above  Leukocytosis In setting of bacteremia. Persistent. Concern for possible aspiration as this has been an issue in the past. Chest x-ray suggests possible infiltration. -Flagyl x5 days  Normocytic anemia Stable.  History of pulmonary embolism -Continue Eliquis  Thrombocytopenia Thought to be secondary to sepsis  AKI on CKD stage IIIb Baseline creatinine appears to have been about 3.5. improved from recent baseline  Malnutrition Patient presented with PEG tube -Continue tube feeds -Decrease free water to 100 ml q6 hours; may discontinue pending management of heart failure  Depression -Continue Prozac  Hyperlipidemia -Continue Crestor  Hypothyroidism -Continue Synthroid 125 mcg daily   DVT prophylaxis: Eliquis Code Status:   Code Status: Full Code Family Communication: Called aunt (incorrectly stated daughter on yesterday's progress note), no answer again today Disposition Plan: Discharge pending placement vs transfer back to [facility] per family request   Consultants:   Infectious disease  Cardiology  Cardiothoracic surgery  Procedures:   11/23: Transthoracic Echocardiogram IMPRESSIONS    1. Highly suggestive of multiple valve endocarditis. Mitral valve with moderate MR, visible vegetation on anterior leaflet not previously seen, and images suggest involvement of posterior leaflet as well. Bioprosthetic aortc valve  leaflets appear  thickened, restricted, with severe AS  by gradients. There may also be a vegetation on the ventricular aspect of the aortic valve. There is a linear echodensity in RVOT, cannot see where it attaches, cannot exclude pulmonic valve endocarditis. Recommend  TEE for further evaluation.  2. Left ventricular ejection fraction, by visual estimation, is 50 to 55%. The left ventricle has low normal function. There is mildly increased left ventricular hypertrophy.  3. Left ventricular diastolic parameters are consistent with Grade II diastolic dysfunction (pseudonormalization).  4. The left ventricle has no regional wall motion abnormalities.  5. Global right ventricle has mildly reduced systolic function.The right ventricular size is normal. Right vetricular wall thickness was not assessed.  6. Left atrial size was severely dilated.  7. Right atrial size was mildly dilated.  8. Small pericardial effusion.  9. Moderate vegetation on the mitral valve. 10. The mitral valve is abnormal. Moderate mitral valve regurgitation. 11. New mobile echodensity on anterior mitral valve leaflet, on atrial aspect of leaflet. Prior TEE images reviewed, not previously visualized. On image 14, there also may be additional involvement of posterior leaflet. 12. The tricuspid valve is grossly normal. Tricuspid valve regurgitation moderate-severe. 13. Aortic valve regurgitation is trivial. Severe aortic valve stenosis. 14. 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 There appears to be an echodensity seen (see image 11) on ventricular side of valve. No significant regurgitation. Leaflets appear thickened and do not open fully. Increased valve gradient,  mean gradient 43 mmHg. Recommend further evaluation with TEE. 15. The pulmonic valve was grossly normal. Pulmonic valve regurgitation is mild to moderate. 16. There is a linear echodenisity seen in the RVOT, cannot clearly see to where it attaches.  Cannot exclude pulmonary valve endocarditis. 17. The aortic root was not well visualized. 18. Moderately elevated pulmonary artery systolic pressure. 19. The tricuspid regurgitant velocity is 3.23 m/s, and with an assumed right atrial pressure of 8 mmHg, the estimated right ventricular systolic pressure is moderately elevated at 49.6 mmHg. 20. The inferior vena cava is normal in size with <50% respiratory variability, suggesting right atrial pressure of 8 mmHg. 21. The interatrial septum was not well visualized.    11/25: Transesophageal Echocardiogram  IMPRESSIONS    1. Left ventricular ejection fraction, by visual estimation, is 50 to 55%. The left ventricle has normal function. Normal left ventricular size. There is no left ventricular hypertrophy.  2. Global right ventricle has normal systolic function.The right ventricular size is normal. No increase in right ventricular wall thickness.  3. Left atrial size was moderately dilated.  4. Right atrial size was normal.  5. The mitral valve is normal in structure. Moderate mitral valve regurgitation. No evidence of mitral stenosis.  6. Mobile vegetation (1.5cm x 0.5 cm) on the P3 segment of the mitral valve. 3D mitral valve images were obtained.  7. The tricuspid valve is normal in structure. Tricuspid valve regurgitation moderate.  8. Aortic valve mean gradient measures 35.0 mmHg.  9. Aortic valve peak gradient measures 63.4 mmHg. 10. Aortic valve regurgitation is not visualized. Moderate to severe aortic valve stenosis. 11. There is a mobile 1.3 cm x 0.9 cm vegetation on the aortic aspect of the bioprosthetic aortic valve. Acceleration time 98 ms. DI 0.26. 3D images were obtained. 12. The pulmonic valve was normal in structure. Pulmonic valve regurgitation is not visualized.  Antimicrobials:  Merrem  Fosfomycin  Ampicillin  Ceftriaxone    Subjective: Worsening breathing overnight. Requiring increased  oxygen.  Objective: Vitals:   01/30/19 0000 01/30/19 0503 01/30/19  1610 01/30/19 0752  BP: 106/71 109/68  115/75  Pulse: 92 91 96 97  Resp: (!) 21 19  (!) 23  Temp: 98.3 F (36.8 C) 98.4 F (36.9 C)  97.8 F (36.6 C)  TempSrc: Oral Oral  Oral  SpO2: 93% 96% 95% 94%  Weight:   72.3 kg   Height:        Intake/Output Summary (Last 24 hours) at 01/30/2019 0940 Last data filed at 01/30/2019 9604 Gross per 24 hour  Intake 570 ml  Output 1300 ml  Net -730 ml   Filed Weights   01/27/19 0448 01/28/19 0420 01/30/19 0634  Weight: 68.1 kg 69.5 kg 72.3 kg    Examination:  General exam: Appears calm and comfortable Respiratory system: Diminished bilaterally with some rales. Respiratory effort normal. Cardiovascular system: S1 & S2 heard, RRR. No murmurs, rubs, gallops or clicks. Gastrointestinal system: Abdomen is nondistended, soft and nontender. No organomegaly or masses felt. Normal bowel sounds heard. Central nervous system: Alert and oriented. No focal neurological deficits. Extremities: Significant LUE edema and bilateral LE edema. No calf tenderness Skin: No cyanosis. No rashes Psychiatry: Judgement and insight appear normal. Mood & affect appropriate.     Data Reviewed: I have personally reviewed following labs and imaging studies  CBC: Recent Labs  Lab February 18, 2019 1959 01/24/19 0312 01/28/19 0455  WBC  --  15.3* 16.3*  NEUTROABS  --  11.6*  --   HGB 8.7* 8.8* 8.6*  HCT 28.6* 28.0* 28.9*  MCV  --  84.6 88.4  PLT  --  116* 90*   Basic Metabolic Panel: Recent Labs  Lab 01/24/19 0312 01/24/19 1431 01/21/2019 0521 01/22/2019 1647 01/26/19 0252 01/30/19 0500  NA 141  --  139  --  143 141  K 3.8  --  3.4*  --  3.4* 5.0  CL 109  --  105  --  109 103  CO2 24  --  23  --  24 27  GLUCOSE 93  --  97  --  123* 99  BUN 30*  --  31*  --  33* 32*  CREATININE 1.44*  --  1.24*  --  1.16* 0.80  CALCIUM 8.9  --  8.5*  --  8.6* 8.8*  MG  --  1.9 1.8 1.8  --   --   PHOS  --   4.2 4.2 4.5 4.5  --    GFR: Estimated Creatinine Clearance: 72.3 mL/min (by C-G formula based on SCr of 0.8 mg/dL). Liver Function Tests: Recent Labs  Lab 01/21/2019 0521 01/26/19 0252  AST 52*  --   ALT 38  --   ALKPHOS 291*  --   BILITOT 0.3  --   PROT 4.7*  --   ALBUMIN 1.8* 1.8*   No results for input(s): LIPASE, AMYLASE in the last 168 hours. No results for input(s): AMMONIA in the last 168 hours. Coagulation Profile: No results for input(s): INR, PROTIME in the last 168 hours. Cardiac Enzymes: No results for input(s): CKTOTAL, CKMB, CKMBINDEX, TROPONINI in the last 168 hours. BNP (last 3 results) No results for input(s): PROBNP in the last 8760 hours. HbA1C: No results for input(s): HGBA1C in the last 72 hours. CBG: Recent Labs  Lab 01/29/19 1627 01/29/19 2114 01/30/19 0009 01/30/19 0500 01/30/19 0748  GLUCAP 87 112* 120* 87 95   Lipid Profile: No results for input(s): CHOL, HDL, LDLCALC, TRIG, CHOLHDL, LDLDIRECT in the last 72 hours. Thyroid Function Tests: No results for input(s):  TSH, T4TOTAL, FREET4, T3FREE, THYROIDAB in the last 72 hours. Anemia Panel: No results for input(s): VITAMINB12, FOLATE, FERRITIN, TIBC, IRON, RETICCTPCT in the last 72 hours. Sepsis Labs: No results for input(s): PROCALCITON, LATICACIDVEN in the last 168 hours.  Recent Results (from the past 240 hour(s))  MRSA PCR Screening     Status: None   Collection Time: 01/11/2019  2:53 AM   Specimen: Nasopharyngeal  Result Value Ref Range Status   MRSA by PCR NEGATIVE NEGATIVE Final    Comment:        The GeneXpert MRSA Assay (FDA approved for NASAL specimens only), is one component of a comprehensive MRSA colonization surveillance program. It is not intended to diagnose MRSA infection nor to guide or monitor treatment for MRSA infections. Performed at [facility], [address] [place], [place] [address]   Culture, blood (routine x 2)     Status: None   Collection Time:  01/09/2019  3:32 AM   Specimen: BLOOD  Result Value Ref Range Status   Specimen Description BLOOD LEFT ANTECUBITAL  Final   Special Requests   Final    BOTTLES DRAWN AEROBIC ONLY Blood Culture adequate volume   Culture   Final    NO GROWTH 5 DAYS Performed at [facility], [address] [place], [place] [address]    Report Status 01/28/2019 FINAL  Final  Culture, blood (routine x 2)     Status: None   Collection Time: 01/05/2019  3:36 AM   Specimen: BLOOD  Result Value Ref Range Status   Specimen Description BLOOD RIGHT ANTECUBITAL  Final   Special Requests   Final    BOTTLES DRAWN AEROBIC ONLY Blood Culture adequate volume   Culture   Final    NO GROWTH 5 DAYS Performed at [facility], [address] [place], [place] [address]    Report Status 01/28/2019 FINAL  Final  SARS CORONAVIRUS 2 (TAT 6-24 HRS) Nasopharyngeal Nasopharyngeal Swab     Status: None   Collection Time: 01/20/2019  5:35 AM   Specimen: Nasopharyngeal Swab  Result Value Ref Range Status   SARS Coronavirus 2 NEGATIVE NEGATIVE Final    Comment: (NOTE) SARS-CoV-2 target nucleic acids are NOT DETECTED. The SARS-CoV-2 RNA is generally detectable in upper and lower respiratory specimens during the acute phase of infection. Negative results do not preclude SARS-CoV-2 infection, do not rule out co-infections with other pathogens, and should not be used as the sole basis for treatment or other patient management decisions. Negative results must be combined with clinical observations, patient history, and epidemiological information. The expected result is Negative. Fact Sheet for Patients: HairSlick.nohttps://www.fda.gov/media/138098/download Fact Sheet for Healthcare Providers: quierodirigir.comhttps://www.fda.gov/media/138095/download This test is not yet approved or cleared by the [place] FDA and  has been authorized for detection and/or diagnosis of SARS-CoV-2 by FDA under an Emergency Use Authorization (EUA). This EUA will  remain  in effect (meaning this test can be used) for the duration of the COVID-19 declaration under Section 56 4(b)(1) of the Act, 21 U.S.C. section 360bbb-3(b)(1), unless the authorization is terminated or revoked sooner. Performed at [facility], [address] [place], [place] [address]          Radiology Studies: Dg Chest ThebesPort 1 View  Result Date: 01/30/2019 CLINICAL DATA:  58 year old female history of acute respiratory failure with hypoxia. EXAM: PORTABLE CHEST 1 VIEW COMPARISON:  Chest x-ray 01/28/2019. FINDINGS: There is a right upper extremity PICC with  tip terminating in the superior cavoatrial junction. Patchy multifocal airspace consolidation and areas of interstitial prominence scattered throughout the lungs bilaterally (left greater than right), with worsening aeration compared to the prior study. Enlarging small to moderate left and small right pleural effusions. Heart size is mildly enlarged. Pulmonary vasculature is obscured. The patient is rotated to the right on today's exam, resulting in distortion of the mediastinal contours and reduced diagnostic sensitivity and specificity for mediastinal pathology. IMPRESSION: 1. Support apparatus, as above. 2. Worsening aeration throughout the lungs bilaterally. Given the asymmetry of the findings, this is favored to reflect worsening multilobar bilateral pneumonia, although the possibility of superimposed pulmonary edema is not excluded, particularly in light of the bilateral pleural effusions and cardiomegaly. Electronically Signed   By: Trudie Reed M.D.   On: 01/30/2019 08:01   Dg Chest Port 1 View  Result Date: 01/28/2019 CLINICAL DATA:  Rales.  Bacteremia due to enterococcus. EXAM: PORTABLE CHEST - 1 VIEW COMPARISON:  01/04/2019 FINDINGS: Worsening extensive bilateral somewhat nodular airspace opacities throughout both lungs, left worse than right. Heart size upper limits normal. Previous AVR. Sternotomy wires.  Right arm PICC line placement to the distal SVC. No pneumothorax. Possible small left effusion. IMPRESSION: 1. Worsening bilateral infiltrates or edema. 2. PICC line to distal SVC. 3. Possible small left effusion. Electronically Signed   By: Corlis Leak M.D.   On: 01/28/2019 14:33   Dg Abd Portable 1v  Result Date: 01/29/2019 CLINICAL DATA:  Abdominal distension, emesis. EXAM: PORTABLE ABDOMEN - 1 VIEW COMPARISON:  None. FINDINGS: Gastrostomy tube in overlying the central abdomen. Gaseous distention of the stomach. Overall bowel gas pattern appears otherwise grossly nonobstructive. No evidence of free intraperitoneal air. IMPRESSION: 1. Gaseous distention of the stomach. Bowel gas pattern appears otherwise grossly nonobstructive. 2. Gastrostomy tube in the central abdomen. Electronically Signed   By: Bary Richard M.D.   On: 01/29/2019 09:15        Scheduled Meds:  apixaban  5 mg Oral BID   Chlorhexidine Gluconate Cloth  6 each Topical Daily   feeding supplement (PRO-STAT SUGAR FREE 64)  30 mL Per Tube TID   FLUoxetine  10 mg Per Tube QHS   free water  200 mL Per Tube Q6H   levothyroxine  150 mcg Per Tube Q0600   mouth rinse  15 mL Mouth Rinse BID   metoCLOPramide  10 mg Per Tube TID AC & HS   metoprolol tartrate  25 mg Per Tube BID   metroNIDAZOLE  500 mg Oral Q8H   pantoprazole sodium  40 mg Per Tube Daily   polyethylene glycol  17 g Per Tube Q M,W,F   rosuvastatin  5 mg Per Tube Daily   tamsulosin  0.4 mg Oral Daily   Continuous Infusions:  ampicillin (OMNIPEN) IV 2 g (01/30/19 0620)   cefTRIAXone (ROCEPHIN)  IV 2 g (01/30/19 0934)   feeding supplement (OSMOLITE 1.5 CAL) 1,000 mL (01/29/19 1531)     LOS: 7 days     Jacquelin Hawking, MD [facility] 01/30/2019, 9:40 AM  If 7PM-7AM, please contact night-coverage [email or web address]

## 2019-01-30 NOTE — Progress Notes (Signed)
Pt has been constantly calling out, when asked she doesn't need anything. Pt stated she just wants somebody in room with her. Pt's sats had been dropping to 60-70's on 6L [place] just laying in bed multiple time throughout the night. When pt Is alone, he becomes very anxious. Once takes deep breaths, o2 sat comes up to 92-95%. Lung sounds diminished. call bell within reach. Will continue to monitor.

## 2019-01-30 NOTE — TOC Progression Note (Signed)
Transition of Care [facility]) - Progression Note    Patient Details  Name: Audrey Collier MRN: [id number] Date of Birth: 1960/12/15  Transition of Care [facility]) CM/SW [place], [place] Phone Number: 01/30/2019, 12:57 PM  Clinical Narrative:     Patient's [name]- requested referrals to be sent to [facility] and [facility] SNFs located in [place]. Patient has [place] Medicaid and can not be placed in [place]. CSW called and left [name] voice message to return call.   [name], MSW, [facility] Clinical Social Worker [phone]   Expected Discharge Plan: Skilled Nursing Facility Barriers to Discharge: Continued Medical Work up  Expected Discharge Plan and Services Expected Discharge Plan: [place] In-house Referral: Clinical Social Work Discharge Planning Services: NA Post Acute Care Choice: [place] Living arrangements for the past 2 months: [place]                 DME Arranged: N/A DME Agency: NA       HH Arranged: NA [place] Agency: NA         Social Determinants of Health (SDOH) Interventions    Readmission Risk Interventions No flowsheet data found.

## 2019-01-30 NOTE — Progress Notes (Signed)
Attempted to call Davene Costain back regarding pt. Phone just rang. No voicemail available. Will attempt later.  Jerald Kief, RN

## 2019-01-30 NOTE — Anesthesia Postprocedure Evaluation (Signed)
Anesthesia Post Note  Patient: Audrey Collier  Procedure(s) Performed: TRANSESOPHAGEAL ECHOCARDIOGRAM (TEE) (N/A )     Patient location during evaluation: Endoscopy Anesthesia Type: MAC Level of consciousness: awake and alert Pain management: pain level controlled Vital Signs Assessment: post-procedure vital signs reviewed and stable Respiratory status: spontaneous breathing, nonlabored ventilation, respiratory function stable and patient connected to nasal cannula oxygen Cardiovascular status: blood pressure returned to baseline and stable Postop Assessment: no apparent nausea or vomiting Anesthetic complications: no                   Armarion Greek DANIEL

## 2019-01-30 NOTE — Progress Notes (Signed)
Physical Therapy Treatment Patient Details Name: Audrey Collier MRN: [id number] DOB: [birth year] Today's Date: 01/30/2019    History of Present Illness 58yo female s/p aortic valve replacement complicated by need for intubation and PEG placement during recent admit August-October 2020. Now transferred back to [facility] and admitted for ESBL UTI, mitral valve endocarditis. PMH anxiety, developmental delay, aortic valve replacement    PT Comments    Patient progressing very slowly towards PT goals. Reports difficulty breathing and now noted to require 5L/min 02 [place]. Requires Mod-Max A for bed mobility with posterior pelvic tilt and lean. Tolerated sitting EOB performing there ex with BLEs. Noted to have pitting edema left hand and swelling in BLEs, LLE>RLE. Fatigues quickly. Pt oriented to self, place and year with choices. Difficulty with sustaining attention to task. Follows 1 step commands with increased time and repetition. Continue to recommend SNF. Will follow.   Follow Up Recommendations  SNF;Supervision/Assistance - 24 hour     Equipment Recommendations  Other (comment)(defer)    Recommendations for Other Services       Precautions / Restrictions Precautions Precautions: Other (comment);Fall Precaution Comments: PEG, developmental delay Restrictions Weight Bearing Restrictions: No    Mobility  Bed Mobility Overal bed mobility: Needs Assistance Bed Mobility: Supine to Sit;Sit to Sidelying     Supine to sit: Mod assist;HOB elevated;+2 for physical assistance   Sit to sidelying: Max assist;+2 for physical assistance General bed mobility comments: Able to bring RLE to EOB, assist with LLE and with trunk to get to EOB. Posterior lean with posterior pelvic tilt.  Transfers                 General transfer comment: deferred  Ambulation/Gait                 Stairs             Wheelchair Mobility    Modified Rankin (Stroke Patients Only)        Balance Overall balance assessment: Needs assistance Sitting-balance support: Feet supported;No upper extremity supported Sitting balance-Leahy Scale: Poor Sitting balance - Comments: Requires Max A for static sitting balance; able to initiate forward lean with mod A and cues but not sustain. Very weak especially in core. Postural control: Posterior lean                                  Cognition Arousal/Alertness: Awake/alert Behavior During Therapy: Flat affect Overall Cognitive Status: History of cognitive impairments - at baseline                                 General Comments: able to answer questions with given choices, follows 1 step commands with increased time, poor recall and sustained task attention. Oriented to name with choices, place and year with choices.      Exercises General Exercises - Lower Extremity Ankle Circles/Pumps: Both;5 reps;Supine Long Arc Quad: Both;5 reps;AAROM;Seated(requires more assist witih LLE) Heel Raises: Both;5 reps;Seated    General Comments General comments (skin integrity, edema, etc.): VSS. Pt now on 5L/min 02. Sp02 remained >92%. BP pre activity 108/93, post activity BP 118/69.      Pertinent Vitals/Pain Pain Assessment: Faces Faces Pain Scale: No hurt    Home Living  Prior Function            PT Goals (current goals can now be found in the care plan section) Progress towards PT goals: Progressing toward goals(slowly)    Frequency    Min 2X/week      PT Plan Current plan remains appropriate    Co-evaluation              AM-PAC PT "6 Clicks" Mobility   Outcome Measure  Help needed turning from your back to your side while in a flat bed without using bedrails?: A Lot Help needed moving from lying on your back to sitting on the side of a flat bed without using bedrails?: A Lot Help needed moving to and from a bed to a chair (including a wheelchair)?:  Total Help needed standing up from a chair using your arms (e.g., wheelchair or bedside chair)?: Total Help needed to walk in hospital room?: Total Help needed climbing 3-5 steps with a railing? : Total 6 Click Score: 8    End of Session Equipment Utilized During Treatment: Oxygen Activity Tolerance: Patient limited by fatigue Patient left: in bed;with call bell/phone within reach;with bed alarm set Nurse Communication: Mobility status PT Visit Diagnosis: Muscle weakness (generalized) (M62.81);Difficulty in walking, not elsewhere classified (R26.2)     Time: 2878-6767 PT Time Calculation (min) (ACUTE ONLY): 18 min  Charges:  $Therapeutic Activity: 8-22 mins                     Vale Haven, PT, DPT Acute Rehabilitation Services Pager [phone] Office [phone]       Blake Divine A Lanier Ensign 01/30/2019, 11:34 AM

## 2019-01-30 NOTE — Progress Notes (Signed)
Tube feeding turned off, pt NPO for gastric emptying study in am per nuclear meds. Will continue to monitor.

## 2019-01-30 NOTE — Progress Notes (Signed)
SLP Cancellation Note  Patient Details Name: Audrey Collier MRN: [id number] DOB: Jan 07, 1961   Cancelled treatment:        Attempted to see pt for ongoing dysphagia treatment.  Pt is NPO at present for gastric emptying study.  SLP will reattempt as schedule permits   Celedonio Savage, [place], [place] Office: [phone]  01/30/2019, 9:19 AM

## 2019-01-31 ENCOUNTER — Inpatient Hospital Stay (HOSPITAL_COMMUNITY): Payer: Medicaid Other

## 2019-01-31 LAB — BASIC METABOLIC PANEL
Anion gap: 12 (ref 5–15)
BUN: 30 mg/dL — ABNORMAL HIGH (ref 6–20)
CO2: 28 mmol/L (ref 22–32)
Calcium: 9.2 mg/dL (ref 8.9–10.3)
Chloride: 103 mmol/L (ref 98–111)
Creatinine, Ser: 0.83 mg/dL (ref 0.44–1.00)
GFR calc Af Amer: >60 mL/min (ref >60)
GFR calc non Af Amer: >60 mL/min (ref >60)
Glucose, Bld: 100 mg/dL — ABNORMAL HIGH (ref 70–99)
Potassium: 4.4 mmol/L (ref 3.5–5.1)
Sodium: 143 mmol/L (ref 135–145)

## 2019-01-31 LAB — GLUCOSE, CAPILLARY
Glucose-Capillary: 104 mg/dL — ABNORMAL HIGH (ref 70–99)
Glucose-Capillary: 104 mg/dL — ABNORMAL HIGH (ref 70–99)
Glucose-Capillary: 105 mg/dL — ABNORMAL HIGH (ref 70–99)
Glucose-Capillary: 112 mg/dL — ABNORMAL HIGH (ref 70–99)
Glucose-Capillary: 124 mg/dL — ABNORMAL HIGH (ref 70–99)
Glucose-Capillary: 131 mg/dL — ABNORMAL HIGH (ref 70–99)

## 2019-01-31 MED ORDER — IVABRADINE HCL 7.5 MG PO TABS
7.5000 mg | ORAL_TABLET | Freq: Once | ORAL | Status: AC
  Administered 2019-02-01: 7.5 mg
  Filled 2019-01-31 (×2): qty 1

## 2019-01-31 NOTE — Progress Notes (Signed)
OT Cancellation Note  Patient Details Name: Audrey Collier MRN: [id number] DOB: 21-Jan-1961   Cancelled Treatment:    Reason Eval/Treat Not Completed: Other (comment), pt awaiting further cardiac CT scan.  Will hold OT until further imaging.   Delight Stare, OT Acute Rehabilitation Services Pager [phone] Office [phone]   Delight Stare 01/31/2019, 11:05 AM

## 2019-01-31 NOTE — Progress Notes (Signed)
For CARDIAC CTA :  Goal HR <70bpm  Ivabradine ordered for HR control for CTA Verified with pharmacy that it CAN be crushed and administered via PEG tube. Should be given at least 2 hr prior to scan.  Floor nurse will need to coordinate with CT   PICC verified by nurse as Power PICC, can pause IV abx for scan, then resume  Tube feeding will need to be turned off after ivabradine/flush given.   All info relayed to Leslie Dales RN   For questions call Marchia Bond RN Navigator Cardiac Imaging [facility] [phone] Office  [phone] Cell

## 2019-01-31 NOTE — Progress Notes (Signed)
     [address]       [place] [address]             [phone]       Case discussed in structural heart conference this AM. Aortic valve likely has some component of thrombus, and vegetation Cardiac CT will be ordered for further evaluation.  Baden Betsch Bary Leriche

## 2019-01-31 NOTE — Progress Notes (Signed)
Nutrition Follow up  DOCUMENTATION CODES:   Non-severe (moderate) malnutrition in context of acute illness/injury  INTERVENTION:   Tube feeding:  -Osmolite 1.5 @ 30 ml/hr via PEG  -Increase by 10 ml Q4 hours to goal rate of 50 ml/hr (1200 ml) -30 ml Prostat TID -Free water flushes 100 ml Q6 hours   At goal TF provides: 2100 kcals, 120 grams protein, 914 ml free water (1314 ml with flushes). Meets 100% of needs.   NUTRITION DIAGNOSIS:   Moderate Malnutrition related to acute illness(s/p AVR with complications) as evidenced by mild fat depletion, moderate fat depletion, percent weight loss.  Ongoing  GOAL:   Patient will meet greater than or equal to 90% of their needs   Addressed   MONITOR:   PO intake, Supplement acceptance, Diet advancement, Weight trends, Labs, I & O's, Skin  REASON FOR ASSESSMENT:   Consult Enteral/tube feeding initiation and management  ASSESSMENT:   Patient with PMH significant for developmental delay, GERD, recent MSSA bacteremia aortic valve vegetation s/p AVR, and s/p PEG due to poor oral intake. Presents this admission with enterococcus bacteremia with UTI.   RD working remotely.  Gastric emptying study yesterday was normal. Vomited 11/30 but not today per RN. Seems to vomit after drinking liquids quickly and after medication administration with free water. Flushes decreased per hospitalist. Osmolite 1.5 running at 30 ml/hr with no complication. Plan titration to goal slowly. Meal completions charted as 0-35% for her last eight meals. Would continue to meet 100% of needs with TF until intake progresses and stays consistent.   Pt now with bilateral lower extremity edema.   Admission weight: 64.6 kg  Current weight: 73.5 kg   I/O: -1,832 ml since admit UOP: 2,050 ml x 24 hrs   Drips: abx Medications: 40 mg lasix BID, reglan QID, miralax Labs: CBG 76-124  Diet Order:   Diet Order            DIET - DYS 1 Room service appropriate? No;  Fluid consistency: Thin  Diet effective now              EDUCATION NEEDS:   Not appropriate for education at this time  Skin:  Skin Assessment: Reviewed RN Assessment  Last BM:  11/29  Height:   Ht Readings from Last 1 Encounters:  02/17/2019 5\' 2"  (1.575 m)    Weight:   Wt Readings from Last 1 Encounters:  01/31/19 73.5 kg    Ideal Body Weight:  50 kg  BMI:  Body mass index is 29.64 kg/m.  Estimated Nutritional Needs:   Kcal:  2050-2250 kcal  Protein:  105-120 grams  Fluid:  >/= 2 L/day   Mariana Single RD, LDN Clinical Nutrition Pager # - [phone]

## 2019-01-31 NOTE — Progress Notes (Signed)
  Speech Language Pathology Treatment: Dysphagia  Patient Details Name: Audrey Collier MRN: [id number] DOB: 07/15/1960 Today's Date: 01/31/2019 Time: 7425-9563 SLP Time Calculation (min) (ACUTE ONLY): 15 min  Assessment / Plan / Recommendation Clinical Impression  Confirmed that pt could have POs prior to cardiac scans - per radiology it is preferred, but testing will not be completed today. SLP offered sips of water and bites of puree without overt signs of aspiration. RN reports that pt often has vomiting after rapid consumption of thin liquids, so SLP provided consistent straw removal to limit volume/rate. Liquid washes were also provided. Recommend continuing pureed solids and thin liquids. Will f/u briefly to see if solids can be advanced any further.    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   . PMH: anxiety, dizziness, GERD, developmental delay, hypothyroidism, MSSA bacteremia aortic valve vegetation s/p AVR. PEG placement/mech soft diet.  CXR =Increased vascular congestion and interstitial edema. Some improved aeration is noted particularly in the mid left lung and right lungbase.      SLP Plan  Continue with current plan of care       Recommendations  Diet recommendations: Dysphagia 1 (puree);Thin liquid Liquids provided via: Cup;Straw Medication Administration: Crushed with puree Supervision: Staff to assist with self feeding;Full supervision/cueing for compensatory strategies Compensations: Slow rate;Small sips/bites Postural Changes and/or Swallow Maneuvers: Seated upright 90 degrees;Upright 30-60 min after meal                Oral Care Recommendations: Oral care BID Follow up Recommendations: 24 hour supervision/assistance SLP Visit Diagnosis: Dysphagia, unspecified (R13.10) Plan: Continue with current plan of care       GO                Venita Sheffield Laterra Lubinski 01/31/2019,  12:43 PM  Pollyann Glen, M.A. [place] Acute Environmental education officer [phone] Office [phone]

## 2019-01-31 NOTE — Progress Notes (Signed)
PROGRESS NOTE    Audrey Collier  ZOX:[id number] DOB: 04/25/1960 DOA: 01/05/2019 PCP: Pearson GrippeKim, James, MD   Brief Narrative: Audrey SevereSheila D Olden is a 58 y.o. female with history of developmental delay, hypothyroidism who was recently admitted in August 2020 for fever was found to have MSSA bacteremia aortic valve vegetation. She presented secondary to positive blood cultures and was [facility] with evidence atrial valve endocarditis.   Assessment & Plan:   Principal Problem:   Bacteremia due to Enterococcus Active Problems:   S/P AVR (aortic valve replacement)   Nausea & vomiting   Bacteriuria, ESBL Klebsiella    Malnutrition of moderate degree   Enterococcus bacteremia Mitral/aortic endocarditis CT surgery evaluated and patient is not a candidate for surgery. Previously treated with vancomycin at OSH and now on ampicillin/ceftriaxone. Repeat blood cultures no growth to date. -ID recommendations: Continue ampicillin and ceftriaxone; 6 week total course (end date 03/05/2019) followed by life-long amoxicillin 500 mg BID  ESBL UTI Treated initially with one dose of Merrem, switched to Fosfomax for one dose and treatment discontinued per ID recommendations  Aortic stenosis S/p AV replacement CT surgery evaluated. Recommendation for cardiac CT. Cardiology recommending cardiac MRI as a better option but recommending to treat as presumed AV endocarditis and that it is unlikely a prosthetic valve thrombus.  Emesis Patient's aunt states she was told she possibly had gastroparesis. On chart review, mentioned as a possible etiology for vomiting from GI back in September. No formal workup performed. In setting of tube feeds. Patient seems to have persistent issues with this. Gastric emptying study (11/30) is normal. No report of emesis overnight -Zofran prn -Continue Reglan  Acute on chronic diastolic heart failure Grade II dysfunction. In/out do not appear to be  accurate. Patient now with worsening hypoxia and peripheral edema. Chest x-ray obtained today and significant for worsening infiltrates that could represent infection but I am leaning more towards pulmonary edema. Weight is up significantly from admission at least 17 lbs. Complicated by hypoalbuminemia. -Lasix 40 mg IV BID -Strict inputs/urine output and daily weights -Daily BMP, watch potassium  Acute on chronic respiratory failure with hypoxia Secondary to likely aspiration from vomiting but now appears to be in setting of fluid overload. -Continue oxygen; wean to chronic 2 L -Diurese as mentioned above -Repeat chest x-ray  Leukocytosis In setting of bacteremia. Persistent. Concern for possible aspiration as this has been an issue in the past. Chest x-ray suggests possible infiltration but this may be more consistent with fluid overload. She does have frequent vomiting with history of aspiration pneumonia -Flagyl x5 days  Normocytic anemia Stable.  History of pulmonary embolism -Continue Eliquis  Thrombocytopenia Thought to be secondary to sepsis  AKI on CKD stage IIIb Baseline creatinine appears to have been about 3.5. improved from recent baseline  Malnutrition Patient presented with PEG tube -Continue tube feeds -Decrease free water to 100 ml q6 hours; may discontinue pending management of heart failure  Depression -Continue Prozac  Hyperlipidemia -Continue Crestor  Hypothyroidism -Continue Synthroid 125 mcg daily   DVT prophylaxis: Eliquis Code Status:   Code Status: Full Code Family Communication: Aunt on telephone however no response Disposition Plan: Discharge pending placement vs [facility] per family request once patient is more stable.   Consultants:   Infectious disease  Cardiology  Cardiothoracic surgery  Procedures:   11/23: Transthoracic Echocardiogram IMPRESSIONS    1. Highly suggestive of multiple valve  endocarditis. Mitral valve with moderate  MR, visible vegetation on anterior leaflet not previously seen, and images suggest involvement of posterior leaflet as well. Bioprosthetic aortc valve leaflets appear  thickened, restricted, with Collier AS by gradients. There may also be a vegetation on the ventricular aspect of the aortic valve. There is a linear echodensity in RVOT, cannot see where it attaches, cannot exclude pulmonic valve endocarditis. Recommend  TEE for further evaluation.  2. Left ventricular ejection fraction, by visual estimation, is 50 to 55%. The left ventricle has low normal function. There is mildly increased left ventricular hypertrophy.  3. Left ventricular diastolic parameters are consistent with Grade II diastolic dysfunction (pseudonormalization).  4. The left ventricle has no regional wall motion abnormalities.  5. Global right ventricle has mildly reduced systolic function.The right ventricular size is normal. Right vetricular wall thickness was not assessed.  6. Left atrial size was severely dilated.  7. Right atrial size was mildly dilated.  8. Small pericardial effusion.  9. Moderate vegetation on the mitral valve. 10. The mitral valve is abnormal. Moderate mitral valve regurgitation. 11. New mobile echodensity on anterior mitral valve leaflet, on atrial aspect of leaflet. Prior TEE images reviewed, not previously visualized. On image 14, there also may be additional involvement of posterior leaflet. 12. The tricuspid valve is grossly normal. Tricuspid valve regurgitation moderate-Collier. 13. Aortic valve regurgitation is trivial. Collier aortic valve stenosis. 70. 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 There appears to be an echodensity seen (see image 11) on ventricular side of valve. No significant regurgitation. Leaflets appear thickened and do not open fully. Increased valve gradient,  mean gradient 43 mmHg. Recommend further evaluation with  TEE. 15. The pulmonic valve was grossly normal. Pulmonic valve regurgitation is mild to moderate. 16. There is a linear echodenisity seen in the RVOT, cannot clearly see to where it attaches. Cannot exclude pulmonary valve endocarditis. 17. The aortic root was not well visualized. 18. Moderately elevated pulmonary artery systolic pressure. 19. The tricuspid regurgitant velocity is 3.23 m/s, and with an assumed right atrial pressure of 8 mmHg, the estimated right ventricular systolic pressure is moderately elevated at 49.6 mmHg. 20. The inferior vena cava is normal in size with <50% respiratory variability, suggesting right atrial pressure of 8 mmHg. 21. The interatrial septum was not well visualized.    11/25: Transesophageal Echocardiogram  IMPRESSIONS    1. Left ventricular ejection fraction, by visual estimation, is 50 to 55%. The left ventricle has normal function. Normal left ventricular size. There is no left ventricular hypertrophy.  2. Global right ventricle has normal systolic function.The right ventricular size is normal. No increase in right ventricular wall thickness.  3. Left atrial size was moderately dilated.  4. Right atrial size was normal.  5. The mitral valve is normal in structure. Moderate mitral valve regurgitation. No evidence of mitral stenosis.  6. Mobile vegetation (1.5cm x 0.5 cm) on the P3 segment of the mitral valve. 3D mitral valve images were obtained.  7. The tricuspid valve is normal in structure. Tricuspid valve regurgitation moderate.  8. Aortic valve mean gradient measures 35.0 mmHg.  9. Aortic valve peak gradient measures 63.4 mmHg. 10. Aortic valve regurgitation is not visualized. Moderate to Collier aortic valve stenosis. 11. There is a mobile 1.3 cm x 0.9 cm vegetation on the aortic aspect of the bioprosthetic aortic valve. Acceleration time 98 ms. DI 0.26. 3D images were obtained. 12. The pulmonic valve was normal in structure. Pulmonic valve  regurgitation is not visualized.  Antimicrobials:  Merrem  Fosfomycin  Ampicillin  Ceftriaxone    Subjective: Patient states no issues.  Objective: Vitals:   01/30/19 2016 01/30/19 2312 01/31/19 0400 01/31/19 0552  BP: 94/63 99/60 94/60  102/69  Pulse: 90 94  85  Resp: (!) 25 (!) 23 19 20   Temp: 98.9 F (37.2 C) 98.3 F (36.8 C)  98.5 F (36.9 C)  TempSrc: Oral Oral  Oral  SpO2: 97% 92% 96% 90%  Weight:    73.5 kg  Height:        Intake/Output Summary (Last 24 hours) at 01/31/2019 0929 Last data filed at 01/31/2019 0846 Gross per 24 hour  Intake 420 ml  Output 2050 ml  Net -1630 ml   Filed Weights   01/28/19 0420 01/30/19 0634 01/31/19 0552  Weight: 69.5 kg 72.3 kg 73.5 kg    Examination:  General exam: Appears calm and comfortable Respiratory system: Diminished with rales. Mild accessory muscle usage. Tachypnea Cardiovascular system: S1 & S2 heard, RRR. 2/6 systolic murmur. Gastrointestinal system: Abdomen is slightly distended, soft and nontender. Normal bowel sounds heard. Central nervous system: Alert and oriented. No focal neurological deficits. Extremities: Mild LE edema. LUE edema significantly improved. No calf tenderness Skin: No cyanosis. No rashes Psychiatry: Judgement and insight appear normal. Blunt affect    Data Reviewed: I have personally reviewed following labs and imaging studies  CBC: Recent Labs  Lab 01/28/19 0455  WBC 16.3*  HGB 8.6*  HCT 28.9*  MCV 88.4  PLT 90*   Basic Metabolic Panel: Recent Labs  Lab 01/24/19 1431 February 19, 2019 0521 2019/02/19 1647 01/26/19 0252 01/30/19 0500 01/31/19 0500  NA  --  139  --  143 141 143  K  --  3.4*  --  3.4* 5.0 4.4  CL  --  105  --  109 103 103  CO2  --  23  --  24 27 28   GLUCOSE  --  97  --  123* 99 100*  BUN  --  31*  --  33* 32* 30*  CREATININE  --  1.24*  --  1.16* 0.80 0.83  CALCIUM  --  8.5*  --  8.6* 8.8* 9.2  MG 1.9 1.8 1.8  --   --   --   PHOS 4.2 4.2 4.5 4.5  --   --     GFR: Estimated Creatinine Clearance: 70.2 mL/min (by C-G formula based on SCr of 0.83 mg/dL). Liver Function Tests: Recent Labs  Lab 02/19/2019 0521 01/26/19 0252  AST 52*  --   ALT 38  --   ALKPHOS 291*  --   BILITOT 0.3  --   PROT 4.7*  --   ALBUMIN 1.8* 1.8*   No results for input(s): LIPASE, AMYLASE in the last 168 hours. No results for input(s): AMMONIA in the last 168 hours. Coagulation Profile: No results for input(s): INR, PROTIME in the last 168 hours. Cardiac Enzymes: No results for input(s): CKTOTAL, CKMB, CKMBINDEX, TROPONINI in the last 168 hours. BNP (last 3 results) No results for input(s): PROBNP in the last 8760 hours. HbA1C: No results for input(s): HGBA1C in the last 72 hours. CBG: Recent Labs  Lab 01/30/19 1658 01/30/19 2014 01/30/19 2312 01/31/19 0550 01/31/19 0752  GLUCAP 76 100* 104* 104* 124*   Lipid Profile: No results for input(s): CHOL, HDL, LDLCALC, TRIG, CHOLHDL, LDLDIRECT in the last 72 hours. Thyroid Function Tests: No results for input(s): TSH, T4TOTAL, FREET4, T3FREE, THYROIDAB in the last 72 hours. Anemia Panel:  No results for input(s): VITAMINB12, FOLATE, FERRITIN, TIBC, IRON, RETICCTPCT in the last 72 hours. Sepsis Labs: No results for input(s): PROCALCITON, LATICACIDVEN in the last 168 hours.  Recent Results (from the past 240 hour(s))  MRSA PCR Screening     Status: None   Collection Time: 01/12/2019  2:53 AM   Specimen: Nasopharyngeal  Result Value Ref Range Status   MRSA by PCR NEGATIVE NEGATIVE Final    Comment:        The GeneXpert MRSA Assay (FDA approved for NASAL specimens only), is one component of a comprehensive MRSA colonization surveillance program. It is not intended to diagnose MRSA infection nor to guide or monitor treatment for MRSA infections. Performed at [facility], [address], [place], [place] [address]   Culture, blood (routine x 2)     Status: None   Collection Time: 01/21/2019  3:32 AM    Specimen: BLOOD  Result Value Ref Range Status   Specimen Description BLOOD LEFT ANTECUBITAL  Final   Special Requests   Final    BOTTLES DRAWN AEROBIC ONLY Blood Culture adequate volume   Culture   Final    NO GROWTH 5 DAYS Performed at [facility], [address] [place], [place] [address]    Report Status 01/28/2019 FINAL  Final  Culture, blood (routine x 2)     Status: None   Collection Time: 01/03/2019  3:36 AM   Specimen: BLOOD  Result Value Ref Range Status   Specimen Description BLOOD RIGHT ANTECUBITAL  Final   Special Requests   Final    BOTTLES DRAWN AEROBIC ONLY Blood Culture adequate volume   Culture   Final    NO GROWTH 5 DAYS Performed at [facility], [address], [place], [place] [address]    Report Status 01/28/2019 FINAL  Final  SARS CORONAVIRUS 2 (TAT 6-24 HRS) Nasopharyngeal Nasopharyngeal Swab     Status: None   Collection Time: 01/06/2019  5:35 AM   Specimen: Nasopharyngeal Swab  Result Value Ref Range Status   SARS Coronavirus 2 NEGATIVE NEGATIVE Final    Comment: (NOTE) SARS-CoV-2 target nucleic acids are NOT DETECTED. The SARS-CoV-2 RNA is generally detectable in upper and lower respiratory specimens during the acute phase of infection. Negative results do not preclude SARS-CoV-2 infection, do not rule out co-infections with other pathogens, and should not be used as the sole basis for treatment or other patient management decisions. Negative results must be combined with clinical observations, patient history, and epidemiological information. The expected result is Negative. Fact Sheet for Patients: HairSlick.no Fact Sheet for Healthcare Providers: quierodirigir.com This test is not yet approved or cleared by the [place] FDA and  has been authorized for detection and/or diagnosis of SARS-CoV-2 by FDA under an Emergency Use Authorization (EUA). This EUA will remain  in  effect (meaning this test can be used) for the duration of the COVID-19 declaration under Section 56 4(b)(1) of the Act, 21 U.S.C. section 360bbb-3(b)(1), unless the authorization is terminated or revoked sooner. Performed at [facility], [address] [place], [place] [address]          Radiology Studies: Nm Gastric Emptying  Result Date: 01/30/2019 CLINICAL DATA:  Nausea, vomiting EXAM: NUCLEAR MEDICINE GASTRIC EMPTYING SCAN TECHNIQUE: After oral ingestion of radiolabeled meal, sequential abdominal images were obtained for 120 minutes. Residual percentage of activity remaining within the stomach was calculated at 60 and 120 minutes. RADIOPHARMACEUTICALS:  2 mCi  Tc-5m sulfur colloid in standardized meal COMPARISON:  None. FINDINGS: Expected location of the stomach in the left upper quadrant. Ingested meal empties the stomach gradually over the course of the study with 21.3% retention at 60 min and 5.6% retention at 90 min (normal retention less than 30% at a 120 min). IMPRESSION: Normal gastric emptying study. Electronically Signed   By: Charline Bills M.D.   On: 01/30/2019 19:33   Dg Chest Port 1 View  Result Date: 01/30/2019 CLINICAL DATA:  58 year old female history of acute respiratory failure with hypoxia. EXAM: PORTABLE CHEST 1 VIEW COMPARISON:  Chest x-ray 01/28/2019. FINDINGS: There is a right upper extremity PICC with tip terminating in the superior cavoatrial junction. Patchy multifocal airspace consolidation and areas of interstitial prominence scattered throughout the lungs bilaterally (left greater than right), with worsening aeration compared to the prior study. Enlarging small to moderate left and small right pleural effusions. Heart size is mildly enlarged. Pulmonary vasculature is obscured. The patient is rotated to the right on today's exam, resulting in distortion of the mediastinal contours and reduced diagnostic sensitivity and specificity for mediastinal  pathology. IMPRESSION: 1. Support apparatus, as above. 2. Worsening aeration throughout the lungs bilaterally. Given the asymmetry of the findings, this is favored to reflect worsening multilobar bilateral pneumonia, although the possibility of superimposed pulmonary edema is not excluded, particularly in light of the bilateral pleural effusions and cardiomegaly. Electronically Signed   By: Trudie Reed M.D.   On: 01/30/2019 08:01        Scheduled Meds:  apixaban  5 mg Oral BID   Chlorhexidine Gluconate Cloth  6 each Topical Daily   feeding supplement (PRO-STAT SUGAR FREE 64)  30 mL Per Tube TID   FLUoxetine  10 mg Per Tube QHS   free water  100 mL Per Tube Q6H   furosemide  40 mg Intravenous BID   levothyroxine  150 mcg Per Tube Q0600   mouth rinse  15 mL Mouth Rinse BID   metoCLOPramide  10 mg Per Tube TID AC & HS   metoprolol tartrate  25 mg Per Tube BID   metroNIDAZOLE  500 mg Oral Q8H   pantoprazole sodium  40 mg Per Tube Daily   polyethylene glycol  17 g Per Tube Q M,W,F   rosuvastatin  5 mg Per Tube Daily   tamsulosin  0.4 mg Oral Daily   Continuous Infusions:  ampicillin (OMNIPEN) IV 2 g (01/31/19 0506)   cefTRIAXone (ROCEPHIN)  IV 2 g (01/30/19 2140)   feeding supplement (OSMOLITE 1.5 CAL) 1,000 mL (01/31/19 0424)     LOS: 8 days     Jacquelin Hawking, MD [facility] 01/31/2019, 9:29 AM  If 7PM-7AM, please contact night-coverage [email or web address]

## 2019-01-31 DEATH — deceased

## 2019-02-01 ENCOUNTER — Inpatient Hospital Stay (HOSPITAL_COMMUNITY): Payer: Medicaid Other

## 2019-02-01 DIAGNOSIS — I38 Endocarditis, valve unspecified: Secondary | ICD-10-CM

## 2019-02-01 DIAGNOSIS — I509 Heart failure, unspecified: Secondary | ICD-10-CM

## 2019-02-01 DIAGNOSIS — I350 Nonrheumatic aortic (valve) stenosis: Secondary | ICD-10-CM

## 2019-02-01 DIAGNOSIS — T826XXA Infection and inflammatory reaction due to cardiac valve prosthesis, initial encounter: Secondary | ICD-10-CM

## 2019-02-01 DIAGNOSIS — I330 Acute and subacute infective endocarditis: Secondary | ICD-10-CM

## 2019-02-01 LAB — BASIC METABOLIC PANEL
Anion gap: 7 (ref 5–15)
BUN: 25 mg/dL — ABNORMAL HIGH (ref 6–20)
CO2: 32 mmol/L (ref 22–32)
Calcium: 9.1 mg/dL (ref 8.9–10.3)
Chloride: 104 mmol/L (ref 98–111)
Creatinine, Ser: 0.75 mg/dL (ref 0.44–1.00)
GFR calc Af Amer: >60 mL/min (ref >60)
GFR calc non Af Amer: >60 mL/min (ref >60)
Glucose, Bld: 105 mg/dL — ABNORMAL HIGH (ref 70–99)
Potassium: 4.4 mmol/L (ref 3.5–5.1)
Sodium: 143 mmol/L (ref 135–145)

## 2019-02-01 LAB — GLUCOSE, CAPILLARY
Glucose-Capillary: 101 mg/dL — ABNORMAL HIGH (ref 70–99)
Glucose-Capillary: 99 mg/dL (ref 70–99)
Glucose-Capillary: 104 mg/dL — ABNORMAL HIGH (ref 70–99)
Glucose-Capillary: 100 mg/dL — ABNORMAL HIGH (ref 70–99)
Glucose-Capillary: 140 mg/dL — ABNORMAL HIGH (ref 70–99)

## 2019-02-01 LAB — PROCALCITONIN: Procalcitonin: 0.19 ng/mL

## 2019-02-01 MED ORDER — SODIUM CHLORIDE 0.9 % IV SOLN
2.0000 g | INTRAVENOUS | Status: DC
  Administered 2019-02-01 – 2019-02-04 (×18): 2 g via INTRAVENOUS
  Filled 2019-02-01 (×3): qty 2
  Filled 2019-02-01: qty 2000
  Filled 2019-02-01 (×5): qty 2
  Filled 2019-02-01: qty 2000
  Filled 2019-02-01 (×7): qty 2
  Filled 2019-02-01: qty 2000
  Filled 2019-02-01: qty 2
  Filled 2019-02-01 (×2): qty 2000
  Filled 2019-02-01 (×2): qty 2

## 2019-02-01 MED ORDER — METOPROLOL TARTRATE 5 MG/5ML IV SOLN
10.0000 mg | INTRAVENOUS | Status: DC | PRN
  Administered 2019-02-01: 14:00:00 10 mg via INTRAVENOUS

## 2019-02-01 MED ORDER — METOPROLOL TARTRATE 5 MG/5ML IV SOLN
INTRAVENOUS | Status: AC
  Administered 2019-02-01: 10 mg via INTRAVENOUS
  Filled 2019-02-01: qty 10

## 2019-02-01 MED ORDER — IOHEXOL 350 MG/ML SOLN
100.0000 mL | Freq: Once | INTRAVENOUS | Status: AC | PRN
  Administered 2019-02-01: 100 mL via INTRAVENOUS

## 2019-02-01 NOTE — Progress Notes (Signed)
OT Cancellation Note  Patient Details Name: PETRICE BEEDY MRN: [id number] DOB: 06-May-1960   Cancelled Treatment:    Reason Eval/Treat Not Completed: Medical issues which prohibited therapy awaiting further imaging with cardiac CT scan to determine medication adjustments for possible aortic thrombus. Will follow acutely.   Delight Stare, OT Acute Rehabilitation Services Pager [phone] Office [phone]   Delight Stare 02/01/2019, 1:27 PM

## 2019-02-01 NOTE — Progress Notes (Signed)
PHARMACY NOTE:  ANTIMICROBIAL RENAL DOSAGE ADJUSTMENT  Current antimicrobial regimen includes a mismatch between antimicrobial dosage and estimated renal function.  As per policy approved by the Pharmacy & Therapeutics and Medical Executive Committees, the antimicrobial dosage will be adjusted accordingly.  Current antimicrobial dosage:  Ampicillin 2gm IV q6hrs  Indication:  Enterococcal bacteremia and infective endocarditis  Renal Function:  Estimated Creatinine Clearance: 72.9 mL/min (by C-G formula based on SCr of 0.75 mg/dL). []      On intermittent HD, scheduled: []      On CRRT    Antimicrobial dosage has been changed to:  Ampicillin 2 gm IV q4hrs  Additional comments:   Renal function has normalized since Ampicillin begun     Ampicillin and Ceftriaxone 2gm IV q24hrs planned thru 03/04/19 (6 weeks), then Amoxicillin 500 mg BID, likely for life, per ID.  Thank you for allowing pharmacy to be a part of this patient's care.  Arty Baumgartner, [facility]  Pager: [phone] or phone [phone] 02/01/2019 3:11 PM

## 2019-02-01 NOTE — Progress Notes (Signed)
PROGRESS NOTE    Audrey Collier  NWG:[id number] DOB: 1960-05-14 DOA: 01/21/2019 PCP: Pearson Grippe, MD   Brief Narrative:  58 year old with history of developmental delay, hypothyroidism admitted here back in August 2020 for MSSA bacteremia, aortic valve vegetation underwent surgical aortic valve replacement.  Also briefly had PEG tube in place which was eventually removed and transferred to rehab.  Completed antibiotic course.  At rehab became confused and was brought to [facility] hospital where she was diagnosed with ESBL UTI.  Echocardiogram showed mitral valve vegetation transfer to [facility].   Assessment & Plan:   Principal Problem:   Bacteremia due to Enterococcus Active Problems:   S/P AVR (aortic valve replacement)   Nausea & vomiting   Bacteriuria, ESBL Klebsiella    Malnutrition of moderate degree  Enterococcus bacteremia/mitral/aortic valve endocarditis Aortic valve stenosis status post AV replacement -Per CT surgery not a candidate for surgery.  Now on IV ampicillin/Rocephin.  Repeat cultures negative.  Last antibiotics 03/05/2019 followed by lifelong amoxicillin 400 mg twice daily -Possible concern of aortic valve thrombus/vegetation, cardiac CT ordered.  CT surgery following.  Acute on chronic diastolic congestive heart failure, class III.  Grade 2 DD Acute on chronic hypoxic respiratory distress, 2 L nasal cannula at baseline.  Currently on 3 L -Worsening infiltrates on the chest x-ray with pulmonary edema.  Signs of volume overload. -Lasix 40 mg IV twice daily.  Wean off oxygen  Abnormal chest x-ray/infiltrate concern for aspiration pneumonia/pneumonitis -Suspect fluid overload versus aspiration.  Last day of flagyl   ESBL UTI, treated -Initially on meropenem then received a dose of fosfomycin.  Emesis resolved -Gastroparesis?.  Gastric emptying study 11/30-normal.  Supportive management with antiemetics.  May need further abdominal imaging  History of  pulmonary embolism -On Eliquis  Normocytic anemia -Stable  Depression -On Prozac  Hyperlipidemia -Crestor  Hypothyroidism -Synthroid  Moderate protein calorie malnutrition Interventions: Tube feeding, Prostat   DVT prophylaxis: Eliquis Code Status: Full code Family Communication: None Disposition Plan: Maintain hospital stay until cardiac CT, further disposition accordingly.  Might be able to get transferred to [facility], family considering  Consultants:   Infectious disease  CT surgery  Cardiology   Subjective: Seen and examined at bedside, does not any complaints.  Spoke with RN, patient has been having about 2 loose stools daily since being on tube feeding but no concerns for C. difficile at that time.  Review of Systems Otherwise negative except as per HPI, including: General: Denies fever, chills, night sweats or unintended weight loss. Resp: Denies cough, wheezing, shortness of breath. Cardiac: Denies chest pain, palpitations, orthopnea, paroxysmal nocturnal dyspnea. GI: Denies abdominal pain, nausea, vomiting, diarrhea or constipation GU: Denies dysuria, frequency, hesitancy or incontinence MS: Denies muscle aches, joint pain or swelling Neuro: Denies headache, neurologic deficits (focal weakness, numbness, tingling), abnormal gait Psych: Denies anxiety, depression, SI/HI/AVH Skin: Denies new rashes or lesions ID: Denies sick contacts, exotic exposures, travel  Objective: Vitals:   01/31/19 2200 01/31/19 2235 02/01/19 0000 02/01/19 0400  BP:   112/68 107/64  Pulse:    90  Resp: 20 (!) 23 (!) 23 18  Temp:      TempSrc:    Oral  SpO2: 98% 98% 91% 97%  Weight:      Height:        Intake/Output Summary (Last 24 hours) at 02/01/2019 0728 Last data filed at 01/31/2019 1610 Gross per 24 hour  Intake 210 ml  Output --  Net 210 ml  Filed Weights   01/28/19 0420 01/30/19 0634 01/31/19 0552  Weight: 69.5 kg 72.3 kg 73.5 kg     Examination:  General exam: Appears calm and comfortable  Respiratory system: Mild bibasilar rhonchi Cardiovascular system: S1 & S2 heard, RRR. No JVD, positive systolic murmur murmurs, rubs, gallops or clicks. No pedal edema. Gastrointestinal system: Mildly distended but not tender.  Positive bowel sounds. Central nervous system: Alert awake oriented to name and place.  Slightly confused about date Extremities: Strength 4/5 in all extremities. Skin: Midline chest surgical scar noted Psychiatry: Judgement and insight appear normal. Mood & affect appropriate.     Data Reviewed:   CBC: Recent Labs  Lab 01/28/19 0455  WBC 16.3*  HGB 8.6*  HCT 28.9*  MCV 88.4  PLT 90*   Basic Metabolic Panel: Recent Labs  Lab 01/21/2019 1647 01/26/19 0252 01/30/19 0500 01/31/19 0500 02/01/19 0500  NA  --  143 141 143 143  K  --  3.4* 5.0 4.4 4.4  CL  --  109 103 103 104  CO2  --  24 27 28  32  GLUCOSE  --  123* 99 100* 105*  BUN  --  33* 32* 30* 25*  CREATININE  --  1.16* 0.80 0.83 0.75  CALCIUM  --  8.6* 8.8* 9.2 9.1  MG 1.8  --   --   --   --   PHOS 4.5 4.5  --   --   --    GFR: Estimated Creatinine Clearance: 72.9 mL/min (by C-G formula based on SCr of 0.75 mg/dL). Liver Function Tests: Recent Labs  Lab 01/26/19 0252  ALBUMIN 1.8*   No results for input(s): LIPASE, AMYLASE in the last 168 hours. No results for input(s): AMMONIA in the last 168 hours. Coagulation Profile: No results for input(s): INR, PROTIME in the last 168 hours. Cardiac Enzymes: No results for input(s): CKTOTAL, CKMB, CKMBINDEX, TROPONINI in the last 168 hours. BNP (last 3 results) No results for input(s): PROBNP in the last 8760 hours. HbA1C: No results for input(s): HGBA1C in the last 72 hours. CBG: Recent Labs  Lab 01/31/19 1200 01/31/19 1559 01/31/19 2018 01/31/19 2343 02/01/19 0621  GLUCAP 104* 105* 131* 112* 101*   Lipid Profile: No results for input(s): CHOL, HDL, LDLCALC, TRIG,  CHOLHDL, LDLDIRECT in the last 72 hours. Thyroid Function Tests: No results for input(s): TSH, T4TOTAL, FREET4, T3FREE, THYROIDAB in the last 72 hours. Anemia Panel: No results for input(s): VITAMINB12, FOLATE, FERRITIN, TIBC, IRON, RETICCTPCT in the last 72 hours. Sepsis Labs: No results for input(s): PROCALCITON, LATICACIDVEN in the last 168 hours.  Recent Results (from the past 240 hour(s))  MRSA PCR Screening     Status: None   Collection Time: 01/03/2019  2:53 AM   Specimen: Nasopharyngeal  Result Value Ref Range Status   MRSA by PCR NEGATIVE NEGATIVE Final    Comment:        The GeneXpert MRSA Assay (FDA approved for NASAL specimens only), is one component of a comprehensive MRSA colonization surveillance program. It is not intended to diagnose MRSA infection nor to guide or monitor treatment for MRSA infections. Performed at [facility], [place] [address], [place], [place] [address]   Culture, blood (routine x 2)     Status: None   Collection Time: 01/22/2019  3:32 AM   Specimen: BLOOD  Result Value Ref Range Status   Specimen Description BLOOD LEFT ANTECUBITAL  Final   Special Requests   Final  BOTTLES DRAWN AEROBIC ONLY Blood Culture adequate volume   Culture   Final    NO GROWTH 5 DAYS Performed at [facility], [address] [place], [place] [address]    Report Status 01/28/2019 FINAL  Final  Culture, blood (routine x 2)     Status: None   Collection Time: 2019-02-05  3:36 AM   Specimen: BLOOD  Result Value Ref Range Status   Specimen Description BLOOD RIGHT ANTECUBITAL  Final   Special Requests   Final    BOTTLES DRAWN AEROBIC ONLY Blood Culture adequate volume   Culture   Final    NO GROWTH 5 DAYS Performed at [facility], [address], [place], [place] [address]    Report Status 01/28/2019 FINAL  Final  SARS CORONAVIRUS 2 (TAT 6-24 HRS) Nasopharyngeal Nasopharyngeal Swab     Status: None   Collection Time: February 05, 2019  5:35 AM    Specimen: Nasopharyngeal Swab  Result Value Ref Range Status   SARS Coronavirus 2 NEGATIVE NEGATIVE Final    Comment: (NOTE) SARS-CoV-2 target nucleic acids are NOT DETECTED. The SARS-CoV-2 RNA is generally detectable in upper and lower respiratory specimens during the acute phase of infection. Negative results do not preclude SARS-CoV-2 infection, do not rule out co-infections with other pathogens, and should not be used as the sole basis for treatment or other patient management decisions. Negative results must be combined with clinical observations, patient history, and epidemiological information. The expected result is Negative. Fact Sheet for Patients: HairSlick.no Fact Sheet for Healthcare Providers: quierodirigir.com This test is not yet approved or cleared by the [place] FDA and  has been authorized for detection and/or diagnosis of SARS-CoV-2 by FDA under an Emergency Use Authorization (EUA). This EUA will remain  in effect (meaning this test can be used) for the duration of the COVID-19 declaration under Section 56 4(b)(1) of the Act, 21 U.S.C. section 360bbb-3(b)(1), unless the authorization is terminated or revoked sooner. Performed at [facility], [address] [place], [place] [address]          Radiology Studies: Nm Gastric Emptying  Result Date: 01/30/2019 CLINICAL DATA:  Nausea, vomiting EXAM: NUCLEAR MEDICINE GASTRIC EMPTYING SCAN TECHNIQUE: After oral ingestion of radiolabeled meal, sequential abdominal images were obtained for 120 minutes. Residual percentage of activity remaining within the stomach was calculated at 60 and 120 minutes. RADIOPHARMACEUTICALS:  2 mCi Tc-64m sulfur colloid in standardized meal COMPARISON:  None. FINDINGS: Expected location of the stomach in the left upper quadrant. Ingested meal empties the stomach gradually over the course of the study with 21.3% retention at  60 min and 5.6% retention at 90 min (normal retention less than 30% at a 120 min). IMPRESSION: Normal gastric emptying study. Electronically Signed   By: Charline Bills M.D.   On: 01/30/2019 19:33   Dg Chest Port 1 View  Result Date: 01/31/2019 CLINICAL DATA:  Acute respiratory failure. EXAM: PORTABLE CHEST 1 VIEW COMPARISON:  Single view of the chest 01/30/2019 and 01/28/2019. FINDINGS: Right PICC remains in place. Bilateral pleural effusions and airspace disease persist. Airspace disease in the left upper lung zone is improved. Effusion on the right has increased since yesterday's examination. No pneumothorax. IMPRESSION: Mixed picture with some improvement in airspace disease on the left but an increased right pleural effusion. Electronically Signed   By: Drusilla Kanner M.D.   On: 01/31/2019 10:32   Dg Chest Port 1 View  Result Date: 01/30/2019 CLINICAL DATA:  58 year old female history of acute respiratory failure with hypoxia. EXAM: PORTABLE CHEST 1 VIEW COMPARISON:  Chest x-ray 01/28/2019. FINDINGS: There is a right upper extremity PICC with tip terminating in the superior cavoatrial junction. Patchy multifocal airspace consolidation and areas of interstitial prominence scattered throughout the lungs bilaterally (left greater than right), with worsening aeration compared to the prior study. Enlarging small to moderate left and small right pleural effusions. Heart size is mildly enlarged. Pulmonary vasculature is obscured. The patient is rotated to the right on today's exam, resulting in distortion of the mediastinal contours and reduced diagnostic sensitivity and specificity for mediastinal pathology. IMPRESSION: 1. Support apparatus, as above. 2. Worsening aeration throughout the lungs bilaterally. Given the asymmetry of the findings, this is favored to reflect worsening multilobar bilateral pneumonia, although the possibility of superimposed pulmonary edema is not excluded, particularly in  light of the bilateral pleural effusions and cardiomegaly. Electronically Signed   By: Trudie Reedaniel  Entrikin M.D.   On: 01/30/2019 08:01        Scheduled Meds:  apixaban  5 mg Oral BID   Chlorhexidine Gluconate Cloth  6 each Topical Daily   feeding supplement (PRO-STAT SUGAR FREE 64)  30 mL Per Tube TID   FLUoxetine  10 mg Per Tube QHS   free water  100 mL Per Tube Q6H   furosemide  40 mg Intravenous BID   ivabradine  7.5 mg Per Tube Once   levothyroxine  150 mcg Per Tube Q0600   mouth rinse  15 mL Mouth Rinse BID   metoCLOPramide  10 mg Per Tube TID AC & HS   metoprolol tartrate  25 mg Per Tube BID   metroNIDAZOLE  500 mg Oral Q8H   pantoprazole sodium  40 mg Per Tube Daily   polyethylene glycol  17 g Per Tube Q M,W,F   rosuvastatin  5 mg Per Tube Daily   tamsulosin  0.4 mg Oral Daily   Continuous Infusions:  ampicillin (OMNIPEN) IV 2 g (02/01/19 0504)   cefTRIAXone (ROCEPHIN)  IV 2 g (01/31/19 2200)   feeding supplement (OSMOLITE 1.5 CAL) 1,000 mL (01/31/19 1735)     LOS: 9 days   Time spent=28 mins    Era Parr Joline Maxcyhirag Jajaira Ruis, MD [facility]  If 7PM-7AM, please contact night-coverage  02/01/2019, 7:28 AM

## 2019-02-01 NOTE — Progress Notes (Signed)
CT Cardiac showed acute PE= already on Eliquis. Awaiting Cardiac portion to be read.    Andree Golphin

## 2019-02-02 ENCOUNTER — Inpatient Hospital Stay (HOSPITAL_COMMUNITY): Payer: Medicaid Other

## 2019-02-02 LAB — CBC
HCT: 27.3 % — ABNORMAL LOW (ref 36.0–46.0)
Hemoglobin: 8 g/dL — ABNORMAL LOW (ref 12.0–15.0)
MCH: 26.1 pg (ref 26.0–34.0)
MCHC: 29.3 g/dL — ABNORMAL LOW (ref 30.0–36.0)
MCV: 89.2 fL (ref 80.0–100.0)
Platelets: 152 10*3/uL (ref 150–400)
RBC: 3.06 MIL/uL — ABNORMAL LOW (ref 3.87–5.11)
RDW: 18.6 % — ABNORMAL HIGH (ref 11.5–15.5)
WBC: 10.9 10*3/uL — ABNORMAL HIGH (ref 4.0–10.5)
nRBC: 0 % (ref 0.0–0.2)

## 2019-02-02 LAB — COMPREHENSIVE METABOLIC PANEL
ALT: 52 U/L — ABNORMAL HIGH (ref 0–44)
AST: 62 U/L — ABNORMAL HIGH (ref 15–41)
Albumin: 2 g/dL — ABNORMAL LOW (ref 3.5–5.0)
Alkaline Phosphatase: 329 U/L — ABNORMAL HIGH (ref 38–126)
Anion gap: 9 (ref 5–15)
BUN: 24 mg/dL — ABNORMAL HIGH (ref 6–20)
CO2: 33 mmol/L — ABNORMAL HIGH (ref 22–32)
Calcium: 8.7 mg/dL — ABNORMAL LOW (ref 8.9–10.3)
Chloride: 100 mmol/L (ref 98–111)
Creatinine, Ser: 0.83 mg/dL (ref 0.44–1.00)
GFR calc Af Amer: >60 mL/min (ref >60)
GFR calc non Af Amer: >60 mL/min (ref >60)
Glucose, Bld: 106 mg/dL — ABNORMAL HIGH (ref 70–99)
Potassium: 4 mmol/L (ref 3.5–5.1)
Sodium: 142 mmol/L (ref 135–145)
Total Bilirubin: 0.5 mg/dL (ref 0.3–1.2)
Total Protein: 5.1 g/dL — ABNORMAL LOW (ref 6.5–8.1)

## 2019-02-02 LAB — GLUCOSE, CAPILLARY
Glucose-Capillary: 110 mg/dL — ABNORMAL HIGH (ref 70–99)
Glucose-Capillary: 131 mg/dL — ABNORMAL HIGH (ref 70–99)
Glucose-Capillary: 134 mg/dL — ABNORMAL HIGH (ref 70–99)
Glucose-Capillary: 155 mg/dL — ABNORMAL HIGH (ref 70–99)
Glucose-Capillary: 83 mg/dL (ref 70–99)
Glucose-Capillary: 86 mg/dL (ref 70–99)

## 2019-02-02 LAB — MAGNESIUM: Magnesium: 1.8 mg/dL (ref 1.7–2.4)

## 2019-02-02 NOTE — Progress Notes (Addendum)
PROGRESS NOTE  Audrey Collier MIW:[id number] DOB: June 03, 1960 DOA: 02/01/2019 PCP: Pearson Grippe, MD  HPI/Recap of past 63 hours: 58 year old with history of developmental delay, hypothyroidism admitted here back in August 2020 for MSSA bacteremia, aortic valve vegetation underwent surgical aortic valve replacement.  Also briefly had PEG tube in place which was eventually removed and transferred to rehab.  Completed antibiotic course.  At rehab became confused and was brought to [facility] hospital where she was diagnosed with ESBL UTI.  Echocardiogram showed mitral valve vegetation transfer to [facility].  02/02/19: Patient was seen and examined at her bedside this morning.  No acute events overnight.  Afebrile.  Bilateral patchy pulmonary infiltrates on chest x-ray done on 01/31/2019 with bilateral pleural effusion right greater than left.  Currently on 3 L by nasal cannula.  No sign of distress.  She has no new complaints at this time.  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  Now on IV ampicillin/Rocephin.   Repeated blood cultures drawn on 2019/02/01 x 2 negative final.   Seen by infectious disease.  End date for antibiotics 03/05/2019.  Will follow by lifelong amoxicillin 400 mg twice daily.   Due to concern for aortic valve thrombus with a vegetation cardiac CTA coronary was ordered and showed an acute PE.  Patient was already on Eliquis.  CTS following. Will follow up with infectious disease  outpatient  Acute on chronic diastolic congestive heart failure, class III.  Grade 2 DD TEE done on 01/04/2019 showed LVEF 50 to 55% with grade 2 diastolic dysfunction noted on TTE 02-01-19. Independently reviewed chest x-ray done on 01/31/2019 which showed increase in pulmonary vascularity suggestive of pulmonary edema with bilateral pleural effusion right greater than left Currently on IV Lasix 40 mg twice daily Continue strict I's and O's and daily weights Net I&O positive 2.1 L  Acute on chronic hypoxic respiratory distress, 2 L nasal cannula at baseline.  Currently on 3 L secondary to pulmonary edema in the setting of acute on chronic diastolic CHF. Hypervolemic on exam Ongoing diuresing Maintain O2 saturation greater than 92%  Dysphagia with suspected aspiration pneumonia versus pneumonitis Independently reviewed chest x-ray which appears abnormal with increasing pulmonary vascularity with bilateral pleural effusion and possible infiltrate in both lungs. Continue antibiotics, also on IV Flagyl along with Rocephin and ampicillin. On dysphagia 1 diet Continue aspiration precaution Continue to supplement with tube feedings  ESBL UTI, treated -Initially on meropenem then received a dose of fosfomycin.  Emesis resolved -Gastroparesis?.  Gastric emptying study 11/30-normal.  Supportive management with antiemetics.    Acute pulmonary embolism On CTA coronary done on 02/01/2019. Continue Eliquis Maintain O2 saturation greater than 92%  Normocytic anemia -Stable Continue to monitor H&H  Depression -On Prozac  Hyperlipidemia -Crestor  Hypothyroidism -Synthroid  Moderate protein calorie malnutrition Interventions: Tube feeding, Prostat  Physical debility PT OT to assess Fall precautions   DVT prophylaxis: Eliquis Code Status: Full code Family Communication: None  Disposition Plan:  Possible discharge when CTS signs off and patient is  hemodynamically  stable and oxygen saturation is close to baseline.  Consultants:   Infectious disease  CT surgery  Cardiology     Objective: Vitals:   02/01/19 2000 02/02/19 0012 02/02/19 0430 02/02/19 0744  BP: (!) 101/54 112/70 102/69 113/72  Pulse:  97 99 (!) 108  Resp: (!) Temp:  97.7 F (36.5 C) 97.6 F (36.4 C) 97.9 F (36.6 C)  TempSrc:  Oral Oral Oral  SpO2: 95% 95% 97% 95%  Weight:   72.2 kg   Height:        Intake/Output Summary (Last 24 hours) at 02/02/2019 0911 Last data filed at 02/02/2019 0008 Gross per 24 hour  Intake 5591.83 ml  Output 1700 ml  Net 3891.83 ml   Filed Weights   01/30/19 0634 01/31/19 0552 02/02/19 0430  Weight: 72.3 kg 73.5 kg 72.2 kg    Exam:   General: 58 y.o. year-old female well developed well nourished in no acute distress.  Alert and minimally interactive.  Cardiovascular: Regular rate and rhythm with no rubs or gallops.  No thyromegaly or JVD noted.    Respiratory: Mild rales at bases with no wheezing noted.  Poor inspiratory effort.    Abdomen: Soft nontender nondistended with normal bowel sounds x4 quadrants.  PEG tube in place.  Musculoskeletal: Left upper extremity edema.  Trace edema lower extremities bilaterally.  2 out of 4 pulses in all 4 extremities.  Psychiatry: Mood is appropriate for condition and setting   Data Reviewed: CBC: Recent Labs  Lab 01/28/19 0455 02/02/19 0500  WBC 16.3* 10.9*  HGB 8.6* 8.0*  HCT 28.9* 27.3*  MCV 88.4 89.2  PLT 90* 152   Basic Metabolic Panel: Recent Labs  Lab 01/30/19 0500 01/31/19 0500 02/01/19 0500 02/02/19 0500  NA 141 143 143 142  K 5.0 4.4 4.4 4.0  CL 103 103 104 100  CO2 27 28 32 33*  GLUCOSE 99 100* 105* 106*  BUN 32* 30* 25* 24*  CREATININE 0.80 0.83 0.75 0.83  CALCIUM 8.8* 9.2 9.1 8.7*  MG  --   --   --  1.8   GFR: Estimated Creatinine Clearance: 69.5 mL/min (by C-G formula based on SCr of 0.83 mg/dL). Liver Function Tests: Recent Labs  Lab  02/02/19 0500  AST 62*  ALT 52*  ALKPHOS 329*  BILITOT 0.5  PROT 5.1*  ALBUMIN 2.0*   No results for input(s): LIPASE, AMYLASE in the last 168 hours. No results for input(s): AMMONIA in the last 168 hours. Coagulation Profile: No results for input(s): INR, PROTIME in the last 168 hours. Cardiac Enzymes: No results for input(s): CKTOTAL, CKMB, CKMBINDEX, TROPONINI in the last 168 hours. BNP (last 3 results) No results for input(s): PROBNP in the last 8760 hours. HbA1C: No results for input(s): HGBA1C in the last 72 hours. CBG: Recent Labs  Lab 02/01/19 1621 02/01/19 2058 02/02/19 0015 02/02/19 0435 02/02/19 0837  GLUCAP 100* 140* 110* 131* 155*   Lipid Profile: No results for input(s): CHOL, HDL, LDLCALC, TRIG, CHOLHDL, LDLDIRECT in the last 72 hours. Thyroid Function Tests: No results for input(s): TSH, T4TOTAL, FREET4, T3FREE, THYROIDAB in the last 72 hours. Anemia Panel: No results for input(s): VITAMINB12, FOLATE, FERRITIN, TIBC, IRON, RETICCTPCT in the last 72 hours. Urine analysis:    Component Value Date/Time   COLORURINE YELLOW 11/20/2018 1029   APPEARANCEUR HAZY (A) 11/20/2018 1029   LABSPEC 1.013 11/20/2018 1029   PHURINE 6.0 11/20/2018 1029   GLUCOSEU NEGATIVE  11/20/2018 1029   HGBUR SMALL (A) 11/20/2018 1029   BILIRUBINUR NEGATIVE 11/20/2018 1029   KETONESUR NEGATIVE 11/20/2018 1029   PROTEINUR 100 (A) 11/20/2018 1029   UROBILINOGEN 0.2 04/13/2014 1030   NITRITE NEGATIVE 11/20/2018 1029   LEUKOCYTESUR SMALL (A) 11/20/2018 1029   Sepsis Labs: @LABRCNTIP (procalcitonin:4,lacticidven:4)  )No results found for this or any previous visit (from the past 240 hour(s)).    Studies: Ct Coronary Morph W/cta Cor W/score W/ca W/cm &/or Wo/cm  Addendum Date: 02/01/2019   ADDENDUM REPORT: 02/01/2019 15:43 ADDENDUM: Critical Value/emergent results were called by telephone at the time of interpretation on 02/01/2019 at 3:43 pm to provider DR. Gerlean Ren, who  verbally acknowledged these results. Electronically Signed   By: Ilona Sorrel M.D.   On: 02/01/2019 15:43   Result Date: 02/01/2019 EXAM: OVER-READ INTERPRETATION  CT CHEST The following report is an over-read performed by radiologist Dr. Salvatore Marvel of [facility], [place] on 02/01/2019. This over-read does not include interpretation of cardiac or coronary anatomy or pathology. The coronary CTA interpretation by the cardiologist is attached. COMPARISON:  10/05/2018 chest CT. FINDINGS: Cardiovascular: Moderate cardiomegaly. No significant pericardial effusion/thickening. Aortic valve prosthesis in place. Visualized thoracic aorta is normal in course and caliber. Top-normal caliber main pulmonary artery (3.1 cm diameter). Tiny central pulmonary embolus within the bronchus intermedius pulmonary artery branch extending into right lower lobe lobar pulmonary artery branch. Mediastinum/Nodes: Unremarkable esophagus. No pathologically enlarged mediastinal or hilar lymph nodes. Lungs/Pleura: No pneumothorax. Small to moderate dependent right and moderate dependent left pleural effusions with complete left lower lobe and moderate dependent right lower lobe atelectasis. Patchy ground-glass opacity and interlobular septal thickening throughout both lungs. No discrete lung masses or significant pulmonary nodules in the limited aerated portions of the lungs. Upper abdomen: Contrast reflux into the hepatic veins. Azygos continuation of the IVC. Musculoskeletal: No aggressive appearing focal osseous lesions. Intact sternotomy wires. Mild thoracolumbar spondylosis. IMPRESSION: 1. Tiny acute pulmonary embolus within the bronchus intermedius branch extending into the right lower lobe lobar branch. 2. Moderate cardiomegaly with contrast reflux into the hepatic veins suggesting right heart failure. 3. Patchy ground-glass opacity and interlobular septal thickening throughout both lungs, favor pulmonary edema. 4. Small to moderate  right and moderate left dependent pleural effusions with bibasilar atelectasis. 5. Azygos continuation of the IVC. We are attempting to locate the ordering provider. An addendum will be issued when they are reached. Electronically Signed: By: Ilona Sorrel M.D. On: 02/01/2019 15:39    Scheduled Meds:  apixaban  5 mg Oral BID   Chlorhexidine Gluconate Cloth  6 each Topical Daily   feeding supplement (PRO-STAT SUGAR FREE 64)  30 mL Per Tube TID   FLUoxetine  10 mg Per Tube QHS   free water  100 mL Per Tube Q6H   furosemide  40 mg Intravenous BID   levothyroxine  150 mcg Per Tube Q0600   mouth rinse  15 mL Mouth Rinse BID   metoCLOPramide  10 mg Per Tube TID AC & HS   metoprolol tartrate  25 mg Per Tube BID   metroNIDAZOLE  500 mg Oral Q8H   pantoprazole sodium  40 mg Per Tube Daily   polyethylene glycol  17 g Per Tube Q M,W,F   rosuvastatin  5 mg Per Tube Daily   tamsulosin  0.4 mg Oral Daily    Continuous Infusions:  ampicillin (OMNIPEN) IV 2 g (02/02/19 0405)   cefTRIAXone (ROCEPHIN)  IV 2 g (02/01/19 2230)   feeding  supplement (OSMOLITE 1.5 CAL) 1,000 mL (02/01/19 1555)     LOS: 10 days     Darlin Droparole N Raisa Ditto, MD [facility] Pager [phone]  If 7PM-7AM, please contact night-coverage [email or web address] Password [email or web address] 02/02/2019, 9:11 AM

## 2019-02-02 NOTE — Plan of Care (Signed)
  Problem: Education: Goal: Knowledge of General Education information will improve Description: Including pain rating scale, medication(s)/side effects and non-pharmacologic comfort measures Outcome: Not Progressing   Problem: Health Behavior/Discharge Planning: Goal: Ability to manage health-related needs will improve Outcome: Not Progressing   

## 2019-02-02 NOTE — Progress Notes (Signed)
Called Patient's aunt [name] and gave updates. All questions answered to her satisfaction. She states the patient does not have a medical POA. Patient's other aunt is also involved. Myrtha Mantis at [phone]. Called number. No answer.

## 2019-02-02 NOTE — Progress Notes (Signed)
PT Cancellation Note  Patient Details Name: Audrey Collier MRN: [id number] DOB: 01-24-1961   Cancelled Treatment:    Reason Eval/Treat Not Completed: Patient not medically ready. RN reporting pt tachy, vomiting, and dizzy with recent rapid response called due to concern for aspiration. PT will hold treatment until pt is more medically stable and better able to participate in PT session.   Zenaida Niece 02/02/2019, 2:25 PM

## 2019-02-02 NOTE — Progress Notes (Signed)
Called by bedside RN due to suspected aspiration this morning on dysphagia 1 diet. Rapid response team at bedside. Vital signs currently stable. Patient on NRB and sats 99%. Stat CXR ordered and pending.  Called the patient's contact [name] x 2 at [phone] and at [phone] to give updates. No answer.

## 2019-02-02 NOTE — Progress Notes (Signed)
At approximately 0900 patient experienced second episode of N/V totaling approximately 300 cc.  Tube feed stopped, PO meds held.   At around 1030 Patient became tachycardic (ST in 140's), desaturated to the 60's on 3L [place], complained of being hot and dizzy.  Rapid response RN called to help manage patient. Charge RN called as well, followed by Dr. [place] Crane. Patient placed on NRB face mask, strict aspiration precautions, made NPO, portable chest XR done, and monitored very closely for the next several hours. In a few hours patient returned to her baseline of early in the shift and rested comfortably the balance of the day.  NPO, aspiration precautions maintained.  Loose stools today X 3.  Family and MD kept informed.

## 2019-02-02 NOTE — Progress Notes (Signed)
OT Cancellation Note  Patient Details Name: Audrey Collier MRN: [id number] DOB: [birth year]   Cancelled Treatment:    Reason Eval/Treat Not Completed: Medical issues which prohibited therapy.Per RN pt with tachycardia, vomiting, dizziness. Rapid Response called earlier today. RN requesting therapy hold today.  Malka So 02/02/2019, 1:33 PM  Nestor Lewandowsky, OTR/L Acute Rehabilitation Services Pager: [phone] Office: [phone]

## 2019-02-03 LAB — COMPREHENSIVE METABOLIC PANEL
ALT: 41 U/L (ref 0–44)
AST: 33 U/L (ref 15–41)
Albumin: 2.1 g/dL — ABNORMAL LOW (ref 3.5–5.0)
Alkaline Phosphatase: 294 U/L — ABNORMAL HIGH (ref 38–126)
Anion gap: 10 (ref 5–15)
BUN: 16 mg/dL (ref 6–20)
CO2: 32 mmol/L (ref 22–32)
Calcium: 9.3 mg/dL (ref 8.9–10.3)
Chloride: 103 mmol/L (ref 98–111)
Creatinine, Ser: 0.87 mg/dL (ref 0.44–1.00)
GFR calc Af Amer: >60 mL/min (ref >60)
GFR calc non Af Amer: >60 mL/min (ref >60)
Glucose, Bld: 93 mg/dL (ref 70–99)
Potassium: 4 mmol/L (ref 3.5–5.1)
Sodium: 145 mmol/L (ref 135–145)
Total Bilirubin: 0.4 mg/dL (ref 0.3–1.2)
Total Protein: 5.4 g/dL — ABNORMAL LOW (ref 6.5–8.1)

## 2019-02-03 LAB — CBC
HCT: 27.8 % — ABNORMAL LOW (ref 36.0–46.0)
Hemoglobin: 8 g/dL — ABNORMAL LOW (ref 12.0–15.0)
MCH: 26.3 pg (ref 26.0–34.0)
MCHC: 28.8 g/dL — ABNORMAL LOW (ref 30.0–36.0)
MCV: 91.4 fL (ref 80.0–100.0)
Platelets: 151 10*3/uL (ref 150–400)
RBC: 3.04 MIL/uL — ABNORMAL LOW (ref 3.87–5.11)
RDW: 19.2 % — ABNORMAL HIGH (ref 11.5–15.5)
WBC: 10.6 10*3/uL — ABNORMAL HIGH (ref 4.0–10.5)
nRBC: 0 % (ref 0.0–0.2)

## 2019-02-03 LAB — GLUCOSE, CAPILLARY
Glucose-Capillary: 115 mg/dL — ABNORMAL HIGH (ref 70–99)
Glucose-Capillary: 86 mg/dL (ref 70–99)
Glucose-Capillary: 86 mg/dL (ref 70–99)
Glucose-Capillary: 91 mg/dL (ref 70–99)
Glucose-Capillary: 93 mg/dL (ref 70–99)
Glucose-Capillary: 95 mg/dL (ref 70–99)

## 2019-02-03 LAB — BLOOD GAS, ARTERIAL
Acid-Base Excess: 9.2 mmol/L — ABNORMAL HIGH (ref 0.0–2.0)
Bicarbonate: 34.8 mmol/L — ABNORMAL HIGH (ref 20.0–28.0)
Drawn by: 56037
FIO2: 40
O2 Saturation: 95.9 %
Patient temperature: 36.5
pCO2 arterial: 62.4 mmHg — ABNORMAL HIGH (ref 32.0–48.0)
pH, Arterial: 7.362 (ref 7.350–7.450)
pO2, Arterial: 78.7 mmHg — ABNORMAL LOW (ref 83.0–108.0)

## 2019-02-03 LAB — PROTIME-INR
INR: 1.5 — ABNORMAL HIGH (ref 0.8–1.2)
Prothrombin Time: 18.4 seconds — ABNORMAL HIGH (ref 11.4–15.2)

## 2019-02-03 LAB — PROCALCITONIN: Procalcitonin: 0.3 ng/mL

## 2019-02-03 LAB — MAGNESIUM: Magnesium: 1.9 mg/dL (ref 1.7–2.4)

## 2019-02-03 LAB — LACTIC ACID, PLASMA: Lactic Acid, Venous: 0.6 mmol/L (ref 0.5–1.9)

## 2019-02-03 MED ORDER — IPRATROPIUM BROMIDE 0.02 % IN SOLN
0.5000 mg | Freq: Three times a day (TID) | RESPIRATORY_TRACT | Status: DC
  Administered 2019-02-04 (×2): 0.5 mg via RESPIRATORY_TRACT
  Filled 2019-02-03 (×2): qty 2.5

## 2019-02-03 MED ORDER — METOPROLOL TARTRATE 25 MG/10 ML ORAL SUSPENSION
25.0000 mg | Freq: Two times a day (BID) | ORAL | Status: DC
  Administered 2019-02-04: 25 mg
  Filled 2019-02-03 (×2): qty 10

## 2019-02-03 MED ORDER — WARFARIN - PHARMACIST DOSING INPATIENT: Freq: Every day | Status: DC

## 2019-02-03 MED ORDER — WARFARIN SODIUM 5 MG PO TABS
5.0000 mg | ORAL_TABLET | Freq: Once | ORAL | Status: AC
  Administered 2019-02-03: 5 mg via ORAL
  Filled 2019-02-03: qty 1

## 2019-02-03 MED ORDER — HEPARIN (PORCINE) 25000 UT/250ML-% IV SOLN
950.0000 [IU]/h | INTRAVENOUS | Status: DC
  Administered 2019-02-03: 800 [IU]/h via INTRAVENOUS
  Filled 2019-02-03: qty 250

## 2019-02-03 MED ORDER — IPRATROPIUM BROMIDE 0.02 % IN SOLN
0.5000 mg | Freq: Four times a day (QID) | RESPIRATORY_TRACT | Status: DC
  Administered 2019-02-03 (×2): 0.5 mg via RESPIRATORY_TRACT
  Filled 2019-02-03 (×2): qty 2.5

## 2019-02-03 MED ORDER — LEVALBUTEROL HCL 0.63 MG/3ML IN NEBU
0.6300 mg | INHALATION_SOLUTION | Freq: Four times a day (QID) | RESPIRATORY_TRACT | Status: DC
  Administered 2019-02-03 (×2): 0.63 mg via RESPIRATORY_TRACT
  Filled 2019-02-03 (×2): qty 3

## 2019-02-03 MED ORDER — LEVALBUTEROL HCL 0.63 MG/3ML IN NEBU
0.6300 mg | INHALATION_SOLUTION | Freq: Three times a day (TID) | RESPIRATORY_TRACT | Status: DC
  Administered 2019-02-04 (×2): 0.63 mg via RESPIRATORY_TRACT
  Filled 2019-02-03 (×2): qty 3

## 2019-02-03 NOTE — Progress Notes (Signed)
PROGRESS NOTE  Audrey Collier DQQ:[id number] DOB: Oct 07, 1960 DOA: Jan 31, 2019 PCP: Pearson Grippe, MD  HPI/Recap of past 72 hours: 58 year old with history of developmental delay, hypothyroidism admitted here back in August 2020 for MSSA bacteremia, aortic valve vegetation underwent surgical aortic valve replacement.  Also briefly had PEG tube in place which was eventually removed and transferred to rehab.  Completed antibiotic course.  At rehab became confused and was brought to [facility] hospital where she was diagnosed with ESBL UTI.  Echocardiogram showed mitral valve vegetation then was transferred to [facility]. Had TEE completed 11/25. Seen by cardiology and ID. Plan to complete Rocephin and Ampicillin on 03/05/19. Concern for aspiration PNA/pneumonitis. IV flagyl was added. Evaluated by speech with recommendation for dysphagia 1 diet. Respiratory distress event on 12/3 with CXR suggestive of pulmonary edema with B/L pulmonary infiltrates.  Possible aspiration for which patient was made NPO to avoid further complication.   02/03/19: Patient was seen and examined at her bedside this morning.  States her breathing is not so good.  ABG obtained this morning on 5 L nasal cannula showed compensated pH with PCO2 of 62 and PO2 of 78.  Curbsided with PCCM.  Discussed with Dr. Vassie Loll. Keep NPO and No BIPAP with suspected aspiration.  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   Continue IV ampicillin/Rocephin as  recommended by infectious disease with end date 03/05/2019.     Repeated blood cultures drawn on 2019-01-31 x 2 negative final.   Will follow by lifelong amoxicillin 400 mg twice daily.   Due to concern for aortic valve thrombus with a vegetation cardiac CTA coronary was ordered and showed an acute PE.   Patient was already on Eliquis.  Plan to follow-up with infectious disease outpatient.  Acute on chronic diastolic congestive heart failure, class III.  Grade 2 DD TEE done on 01/04/2019 showed LVEF 50 to 55%  TTE done on January 31, 2019 showed LVEF 50 to 55% with grade 2 diastolic dysfunction.   Reviewed chest x-ray done on 01/31/2019 which showed increase in pulmonary vascularity suggestive of pulmonary edema with bilateral pleural effusion right greater than left Personally reviewed chest x-ray done on 02/02/2019 which showed worsening bilateral pulmonary infiltrates, bilateral pleural effusion present. Continue IV Lasix 40 mg twice daily Continue strict I's and O's and daily weights Net I&O improving +1.5L from +2.1L yesterday  Acute on chronic hypoxic hypercarbic respiratory distress, 2 L nasal cannula at baseline.  Currently on 6 L secondary to worsening pulmonary edema, possible aspiration pneumonitis, and acute on chronic diastolic CHF. ABG done on 02/03/2019 on 5 L showed compensated pH with PCO2 of 62 and PaO2 78.  No BiPAP due to suspicion for aspiration. Hypervolemic on exam Ongoing diuresing Maintain O2 saturation greater than 92% Continue Xopenex nebs and ipratoprium nebs q6H  Dysphagia with suspected aspiration pneumonia versus pneumonitis Independently reviewed chest x-ray 12/3 which appears abnormal with increasing pulmonary vascularity with bilateral pleural effusion and suspect pulmonary infiltrates bilaterally. Continue IV Flagyl along with Rocephin and ampicillin. On dysphagia 1  diet>> Keep NPO for now. Continue aspiration precaution Resume tube feedings with aspiration  precautions and close monitoring.  ESBL UTI, treated -Initially on meropenem then received a dose of fosfomycin.  Emesis resolved Gastric emptying study 11/30-normal.  IV antiemetics PRN.    Acute pulmonary embolism On CTA coronary done on 02/01/2019. Continue Eliquis Maintain O2 saturation greater than 92%  Normocytic anemia -Stable Continue to monitor H&H  Depression -On Prozac  Hyperlipidemia -Crestor  Hypothyroidism -Synthroid  Moderate protein calorie malnutrition Resume Tube feeding  Physical debility PT assessment recommended SNF CSW assisting with placement. Continue PT with assistance and fall precautions.   DVT prophylaxis: Eliquis Code Status: Full code Family Communication: Updated [name] via phone on 02/02/19. Plan to call again today.  Disposition Plan:  Plan to discharge to SNF in the next 48 to 72 hours when respiratory status is close to baseline.  Consultants:   Infectious disease  CT surgery  Cardiology  Speech therapist     Objective: Vitals:   02/03/19 0445 02/03/19 0745 02/03/19 0800 02/03/19 1143  BP: 104/65 110/68 109/68 102/76  Pulse: 99   97  Resp: 20 18 18 18   Temp: 97.8 F (36.6 C) 98.6 F (37 C)  98 F (36.7 C)  TempSrc: Oral Oral  Oral  SpO2: 99% 100% 100% 99%  Weight: 70.9 kg     Height:        Intake/Output Summary (Last 24 hours) at 02/03/2019 1311 Last data filed at 02/03/2019 1011 Gross per 24 hour  Intake -  Output 400 ml  Net -400 ml   Filed Weights   01/31/19 0552 02/02/19 0430 02/03/19 0445  Weight: 73.5 kg 72.2 kg 70.9 kg    Exam:  . General: 58 y.o. year-old female well-developed well-nourished no acute distress.  Alert and minimally interactive. . Cardiovascular: Regular rate and rhythm no rubs or gallops.  No JVD or thyromegaly noted.   [name] Respiratory: Diffuse rales bilaterally no wheezing noted.  Poor inspiratory effort.   . Abdomen: Soft nontender bowel sounds present.   PEG tube in place.   . Musculoskeletal: Dependent edema all throughout.   [name] Psychiatry: Mood is appropriate for condition and setting.   Data Reviewed: CBC: Recent Labs  Lab 01/28/19 0455 02/02/19 0500 02/03/19 0528  WBC 16.3* 10.9* 10.6*  HGB 8.6* 8.0* 8.0*  HCT 28.9* 27.3* 27.8*  MCV 88.4 89.2 91.4  PLT 90* 152 315   Basic Metabolic Panel: Recent Labs  Lab 01/30/19 0500 01/31/19 0500 02/01/19 0500 02/02/19 0500 02/03/19 0528  NA 141 143 143 142 145  K 5.0 4.4 4.4 4.0 4.0  CL 103 103 104 100 103  CO2 27 28 32 33* 32  GLUCOSE 99 100* 105* 106* 93  BUN 32* 30* 25* 24* 16  CREATININE 0.80 0.83 0.75 0.83 0.87  CALCIUM 8.8* 9.2 9.1 8.7* 9.3  MG  --   --   --  1.8 1.9   GFR: Estimated Creatinine Clearance: 65.8 mL/min (by C-G formula based on SCr of 0.87 mg/dL). Liver Function Tests: Recent Labs  Lab 02/02/19 0500 02/03/19 0528  AST 62* 33  ALT 52* 41  ALKPHOS 329* 294*  BILITOT 0.5 0.4  PROT 5.1* 5.4*  ALBUMIN 2.0* 2.1*   No results for input(s): LIPASE, AMYLASE in the last 168 hours. No results for input(s): AMMONIA in the last 168 hours. Coagulation Profile: No results for input(s): INR, PROTIME in the last 168 hours. Cardiac Enzymes: No results for input(s):  CKTOTAL, CKMB, CKMBINDEX, TROPONINI in the last 168 hours. BNP (last 3 results) No results for input(s): PROBNP in the last 8760 hours. HbA1C: No results for input(s): HGBA1C in the last 72 hours. CBG: Recent Labs  Lab 02/02/19 2052 02/03/19 0016 02/03/19 0450 02/03/19 0747 02/03/19 1141  GLUCAP 86 91 86 86 93   Lipid Profile: No results for input(s): CHOL, HDL, LDLCALC, TRIG, CHOLHDL, LDLDIRECT in the last 72 hours. Thyroid Function Tests: No results for input(s): TSH, T4TOTAL, FREET4, T3FREE, THYROIDAB in the last 72 hours. Anemia Panel: No results for input(s): VITAMINB12, FOLATE, FERRITIN, TIBC, IRON, RETICCTPCT in the last 72 hours. Urine analysis:    Component Value Date/Time    COLORURINE YELLOW 11/20/2018 1029   APPEARANCEUR HAZY (A) 11/20/2018 1029   LABSPEC 1.013 11/20/2018 1029   PHURINE 6.0 11/20/2018 1029   GLUCOSEU NEGATIVE 11/20/2018 1029   HGBUR SMALL (A) 11/20/2018 1029   BILIRUBINUR NEGATIVE 11/20/2018 1029   KETONESUR NEGATIVE 11/20/2018 1029   PROTEINUR 100 (A) 11/20/2018 1029   UROBILINOGEN 0.2 04/13/2014 1030   NITRITE NEGATIVE 11/20/2018 1029   LEUKOCYTESUR SMALL (A) 11/20/2018 1029   Sepsis Labs: @LABRCNTIP (procalcitonin:4,lacticidven:4)  )No results found for this or any previous visit (from the past 240 hour(s)).    Studies: No results found.  Scheduled Meds: . apixaban  5 mg Oral BID  . Chlorhexidine Gluconate Cloth  6 each Topical Daily  . feeding supplement (PRO-STAT SUGAR FREE 64)  30 mL Per Tube TID  . FLUoxetine  10 mg Per Tube QHS  . free water  100 mL Per Tube Q6H  . furosemide  40 mg Intravenous BID  . levothyroxine  150 mcg Per Tube Q0600  . mouth rinse  15 mL Mouth Rinse BID  . metoCLOPramide  10 mg Per Tube TID AC & HS  . [START ON 02/07/2019] metoprolol tartrate  25 mg Per Tube BID  . pantoprazole sodium  40 mg Per Tube Daily  . polyethylene glycol  17 g Per Tube Q M,W,F  . rosuvastatin  5 mg Per Tube Daily  . tamsulosin  0.4 mg Oral Daily    Continuous Infusions: . ampicillin (OMNIPEN) IV 2 g (02/03/19 1255)  . cefTRIAXone (ROCEPHIN)  IV 2 g (02/03/19 1057)  . feeding supplement (OSMOLITE 1.5 CAL) 1,000 mL (02/01/19 1555)     LOS: 11 days     Darlin Droparole N , MD [facility] Pager [phone]  If 7PM-7AM, please contact night-coverage [email or web address] Password [email or web address] 02/03/2019, 1:11 PM

## 2019-02-03 NOTE — Progress Notes (Signed)
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   Medical History: Past Medical History:  Diagnosis Date  . Anxiety   . Dizziness   . Endometrial polyp 03/26/2014  . Fecal occult blood test positive 03/15/2013   will send 3 cards home and refer to GI for colonoscopy  . GERD (gastroesophageal reflux disease)   . Hypothyroidism   . Mental disorder    mentally challenged  . PMB (postmenopausal bleeding) 03/26/2014  . Thickened endometrium 03/26/2014   ?polyp will get HSG    Medications:  Infusions:  . ampicillin (OMNIPEN) IV 2 g (02/03/19 1255)  . cefTRIAXone (ROCEPHIN)  IV 2 g (02/03/19 1057)  . feeding supplement (OSMOLITE 1.5 CAL) 1,000 mL (02/01/19 1555)  . heparin      Assessment: 58 yo female with endocarditis, on Eliquis, started recently at outside hospital for small PE.  Cardiac CT now reveals both vegetation and thrombosis on bioprosthetic aortic valve.  Eliquis is not indicated for valve thrombosis.    Discussed with Dr. [place] Crane, who discussed with cardiology.  Will stop Eliquis and switch to warfarin with IV heparin bridge.  Last dose of Eliquis this AM at 1059 AM.  Goal of Therapy:  INR 2-3 Heparin level 0.3-0.7 units/ml Monitor platelets by anticoagulation protocol: Yes   Plan:  1. Stop  Eliquis 2. Start IV heparin at 800 units/hr tonight at 10 pm without a bolus. 3. Check aPTT 8 hrs after heparin gtt starts. Heparin levels will be falsely elevated given recent Eliquis. 4. Coumadin 5 mg x 1 tonight. 5. Daily aPTT, heparin level and INR. 6. Will need Coumadin education prior to discharge.  Marguerite Olea, [facility] Clinical Pharmacist Phone [phone]  02/03/2019 2:13 PM

## 2019-02-03 NOTE — Progress Notes (Signed)
Physical Therapy Treatment Patient Details Name: Audrey Collier MRN: [id number] DOB: [birth year] Today's Date: 02/03/2019    History of Present Illness 58yo female s/p aortic valve replacement complicated by need for intubation and PEG placement during recent admit August-October 2020. Now transferred back to [facility] and admitted for ESBL UTI, mitral valve endocarditis. Pt with acute PE 12/2. PMH anxiety, developmental delay, aortic valve replacement    PT Comments    Patient progressing slowly towards PT goals. Requires assist for bed mobility and able to stand half way with assist of 2 and RW. Tolerated SPT to chair with Max A of 2. Pt with slow processing and response time to commands./questions but able to initiate movement minimally. Sp02 dropped to mid 80s (while 02 was removed for transfer) but recovered quickly, pt on 6L/min 02. Recommend use of stedy to return pt to bed. RN notified. Will follow.   Follow Up Recommendations  SNF;Supervision/Assistance - 24 hour     Equipment Recommendations  Other (comment)(defer)    Recommendations for Other Services       Precautions / Restrictions Precautions Precautions: Fall Precaution Comments: PEG, developmental delay Restrictions Weight Bearing Restrictions: No    Mobility  Bed Mobility Overal bed mobility: Needs Assistance Bed Mobility: Supine to Sit Rolling: +2 for physical assistance;Total assist   Supine to sit: Max assist;+2 for physical assistance;HOB elevated     General bed mobility comments: Pt needed A for legs (she did help minimally) and also A of trunk (again A'd minimally) and then also needed max A to scoot to EOB with use of chuck pad  Transfers Overall transfer level: Needs assistance Equipment used: 2 person hand held assist;Rolling walker (2 wheeled) Transfers: Sit to/from [facility] Sit to Stand: Max assist;+2 physical assistance Stand pivot transfers: Max assist;+2 physical assistance        General transfer comment: Pt stood x1 with Max A +2 from bed (but did not achieve full upright posture) with RW. Pt did Assist minimally and did WB on her legs for stand pivot transfer with gait belt and use of chuck pad.  Ambulation/Gait             General Gait Details: unable   Stairs             Wheelchair Mobility    Modified Rankin (Stroke Patients Only)       Balance Overall balance assessment: Needs assistance Sitting-balance support: No upper extremity supported;Feet supported Sitting balance-Leahy Scale: Poor Sitting balance - Comments: Fair to zero (statically fair to poor; dynamically poor to zero). Brushed hair sitting EOB and pt with worsened posterior LOB esp when fatigued requiring assist fo upright. Postural control: Posterior lean Standing balance support: During functional activity Standing balance-Leahy Scale: Zero Standing balance comment: +2 A for only getting to a parital stand with RW in front                            Cognition Arousal/Alertness: Awake/alert Behavior During Therapy: Flat affect Overall Cognitive Status: History of cognitive impairments - at baseline                                 General Comments: Follows one step commands, sometimes needs increased time.      Exercises General Exercises - Lower Extremity Ankle Circles/Pumps: Both;5 reps;Supine Quad Sets: Both;Supine;5 reps Other Exercises Other Exercises:  LUE is edematous (removed elastic bracelets and put them on her RUE), placed arm up on pillow for edema control, will continue to monitor for any splinting needs. Pt definitely moves this arm with more effort than RUE.    General Comments General comments (skin integrity, edema, etc.): Sp02 dropped to mid 80s on RA (removed to transfer to chair due to short line), on 6L/min 02, resolved quickly. Bp stable.      Pertinent Vitals/Pain Pain Assessment: Faces Faces Pain Scale: Hurts  a little bit Pain Location: abdomen Pain Descriptors / Indicators: Sore Pain Intervention(s): Repositioned;Monitored during session    Home Living                      Prior Function            PT Goals (current goals can now be found in the care plan section) Progress towards PT goals: Progressing toward goals    Frequency    Min 2X/week      PT Plan Current plan remains appropriate    Co-evaluation PT/OT/SLP Co-Evaluation/Treatment: Yes Reason for Co-Treatment: For patient/therapist safety;To address functional/ADL transfers PT goals addressed during session: Mobility/safety with mobility;Balance;Strengthening/ROM        AM-PAC PT "6 Clicks" Mobility   Outcome Measure  Help needed turning from your back to your side while in a flat bed without using bedrails?: A Lot Help needed moving from lying on your back to sitting on the side of a flat bed without using bedrails?: A Lot Help needed moving to and from a bed to a chair (including a wheelchair)?: Total Help needed standing up from a chair using your arms (e.g., wheelchair or bedside chair)?: Total Help needed to walk in hospital room?: Total Help needed climbing 3-5 steps with a railing? : Total 6 Click Score: 8    End of Session Equipment Utilized During Treatment: Oxygen(6L/min 02) Activity Tolerance: Patient tolerated treatment well Patient left: in chair;with call bell/phone within reach Nurse Communication: Mobility status(use of stedy to get back to bed) PT Visit Diagnosis: Muscle weakness (generalized) (M62.81);Difficulty in walking, not elsewhere classified (R26.2)     Time: 9629-5284 PT Time Calculation (min) (ACUTE ONLY): 26 min  Charges:  $Therapeutic Activity: 8-22 mins                     Vale Haven, PT, DPT Acute Rehabilitation Services Pager [phone] Office [phone]       Blake Divine A Lanier Ensign 02/03/2019, 12:59 PM

## 2019-02-03 NOTE — Progress Notes (Signed)
Brief cardiology update note:  Reviewed CT results, anticoagulation at the request of Dr. [place] Crane. There is concern for thrombosis of the prosthetic aortic valve leaflets in additional to endocarditis. Question re: switching from [place] to coumadin. Given that she was on DOAC at the time and still developed thrombosis, reasonable to trial coumadin and then recheck gradients after therapy.  We are available for any additional questions.  Buford Dresser, MD, PhD [facility]  [address] [place], [place] [address] [phone]

## 2019-02-03 NOTE — TOC Progression Note (Signed)
Transition of Care [facility]) - Progression Note    Patient Details  Name: Audrey Collier MRN: [id number] Date of Birth: Aug 02, 1960  Transition of Care [facility]) CM/SW [place], [place] Phone Number: 02/03/2019, 4:10 PM  Clinical Narrative:     CSW spoke with the patient's aunt [name]- patient's aunt expressed she wanted the patient to discharge to another SNF. CSW explained she is unable to discharge to SNF in [place] because she needs [place] and she has [place]. Patient's aunt requested to send referrals in the [place] and [place] area. Including the [facility] in [place].    CSW explained  the SNF process and the challenge with securing another SNF placement. CSW stressed, because patient has placement, if medically stable before securing another placement, she will have to return back to the [facility] in [place].   CSW called and discussed placement with patient's aunt [name](she makes decisions for he patient) and advised about the conversation with [name]. Patient's aunt [name] does not want [facility] in [place]. She is agreeable to sending referrals to SNFs in [place]. [name] states she  understands the patient will have to return back to [facility] another SNF placement is not secured. CSW advised she can [facility] with [facility] to assist with finding another SNF.  [name], MSW, [facility] Clinical Social Worker [phone]   Expected Discharge Plan: Skilled Nursing Facility Barriers to Discharge: Continued Medical Work up  Expected Discharge Plan and Services Expected Discharge Plan: [place] In-house Referral: Clinical Social Work Discharge Planning Services: NA Post Acute Care Choice: [place] Living arrangements for the past 2 months: [place]                 DME Arranged: N/A DME Agency: NA       HH Arranged: NA [place] Agency: NA         Social Determinants of Health (SDOH)  Interventions    Readmission Risk Interventions No flowsheet data found.

## 2019-02-03 NOTE — Progress Notes (Signed)
SLP Cancellation Note  Patient Details Name: ROY SNUFFER MRN: [id number] DOB: 1960/04/14   Cancelled treatment:        Discussed with RN.  Presumed aspiration event 2/2 N/V. CXR 12/3 concerning for worsening aeration.  Pt made NPO and tube feeds are being held.  Pt has been tolerating modified diet with SLP.  RN reports poor intake, but no overt s/s of aspiration.  Gastric emptying study normal.  GI sources (N/V) likely cause of presumed aspiration events.  SLP will defer PO trials at this time, but will remain available in case of any needs.  MBSS can be scheduled if there is concern for prandial aspiration.   Celedonio Savage, MA, [place] Office: [phone] 02/03/2019, 9:59 AM

## 2019-02-03 NOTE — Progress Notes (Signed)
Occupational Therapy Treatment Patient Details Name: Audrey Collier MRN: [id number] DOB: 1960/09/01 Today's Date: 02/03/2019    History of present illness 58yo female s/p aortic valve replacement complicated by need for intubation and PEG placement during recent admit August-October 2020. Now transferred back to [facility] and admitted for ESBL UTI, mitral valve endocarditis. PMH anxiety, developmental delay, aortic valve replacement   OT comments  This 58 yo female admitted with above seen today in conjunction with PT due to last time seen by PT pt was not moving as well. Pt continues to struggle with mobility and balance. She was able to sit EOB today and work on grooming as well as get up to recliner today with +2 A. She will continue to benefit from acute OT with follow up at SNF.   Follow Up Recommendations  SNF;Supervision/Assistance - 24 hour    Equipment Recommendations  None recommended by OT       Precautions / Restrictions Precautions Precautions: Fall Precaution Comments: PEG, developmental delay Restrictions Weight Bearing Restrictions: No       Mobility Bed Mobility Overal bed mobility: Needs Assistance Bed Mobility: Supine to Sit Rolling: +2 for physical assistance;Total assist   Supine to sit: Max assist;+2 for physical assistance     General bed mobility comments: Pt needed A for legs (she did help minimally) and also A of trunk (again A'd minimally) and then also needed max A to scoot to EOB with use of chuck pad  Transfers Overall transfer level: Needs assistance Equipment used: 2 person hand held assist Transfers: Sit to/from [facility] Sit to Stand: Max assist;+2 physical assistance Stand pivot transfers: Max assist;+2 physical assistance       General transfer comment: Pt stood x1 with Max A +2 from bed (but did not achieve full upright posture). Pt did A minimally and did WB on her legs for stand pivot transfer with gait belt and use of  chuck pad    Balance Overall balance assessment: Needs assistance Sitting-balance support: No upper extremity supported;Feet supported   Sitting balance - Comments: Fair to zero (statically fair to poor; dynamically poor to zero) Postural control: Posterior lean   Standing balance-Leahy Scale: Zero Standing balance comment: +2 A for only getting to a parital stand with RW in front                           ADL either performed or assessed with clinical judgement   ADL Overall ADL's : Needs assistance/impaired     Grooming: Wash/dry face;Brushing hair Grooming Details (indicate cue type and reason): Sitting EOB pt worked on combing her hair (Mod A) with decreased balance; washing face sitting in recliner (setup/S)                 Toilet Transfer: Maximal assistance;+2 for physical assistance;Stand-pivot Armed forces technical officer Details (indicate cue type and reason): Bed>recliner                 Vision Patient Visual Report: No change from baseline            Cognition Arousal/Alertness: Awake/alert Behavior During Therapy: Flat affect Overall Cognitive Status: History of cognitive impairments - at baseline                                 General Comments: Follows one step commands, sometimes needs increased time.  Exercises Other Exercises Other Exercises: LUE is edematous (removed elastic bracelets and put them on her RUE), placed arm up on pillow for edema control, will continue to monitor for any splinting needs. Pt definitely moves this arm with more effort than RUE.           Pertinent Vitals/ Pain       Pain Assessment: (said she was in pain, but never told us where. No signs of pain with movement)         Frequency  Min 2X/week        Progress Toward Goals  OT Goals(current goals can now be found in the care plan section)  Progress towards OT goals: Progressing toward goals     Plan Discharge plan remains  appropriate    Co-evaluation    PT/OT/SLP Co-Evaluation/Treatment: Yes Reason for Co-Treatment: For patient/therapist safety;To address functional/ADL transfers          AM-PAC OT "6 Clicks" Daily Activity     Outcome Measure   Help from another person eating meals?: Total(NPO) Help from another person taking care of personal grooming?: A Lot Help from another person toileting, which includes using toliet, bedpan, or urinal?: Total Help from another person bathing (including washing, rinsing, drying)?: A Lot Help from another person to put on and taking off regular upper body clothing?: A Lot Help from another person to put on and taking off regular lower body clothing?: Total 6 Click Score: 9    End of Session Equipment Utilized During Treatment: Oxygen(6 liters)  OT Visit Diagnosis: Muscle weakness (generalized) (M62.81);Other abnormalities of gait and mobility (R26.89);Other symptoms and signs involving cognitive function;Cognitive communication deficit (R41.841)   Activity Tolerance Patient tolerated treatment well   Patient Left in chair;with call bell/phone within reach;with chair alarm set   Nurse Communication Mobility status;Need for lift equipment(stedy)        Time: 3662-9476 OT Time Calculation (min): 28 min  Charges: OT General Charges $OT Visit: 1 Visit OT Treatments $Self Care/Home Management : 8-22 mins  Ignacia Palma, OTR/L Acute [employer] Pager [phone] Office [phone]      Evette Georges 02/03/2019, 12:47 PM

## 2019-02-03 NOTE — Plan of Care (Signed)
  Problem: Education: Goal: Knowledge of General Education information will improve Description Including pain rating scale, medication(s)/side effects and non-pharmacologic comfort measures Outcome: Progressing   Problem: Health Behavior/Discharge Planning: Goal: Ability to manage health-related needs will improve Outcome: Progressing   

## 2019-02-04 ENCOUNTER — Inpatient Hospital Stay (HOSPITAL_COMMUNITY): Payer: Medicaid Other

## 2019-02-04 DIAGNOSIS — R7881 Bacteremia: Secondary | ICD-10-CM | POA: Diagnosis not present

## 2019-02-04 DIAGNOSIS — I340 Nonrheumatic mitral (valve) insufficiency: Secondary | ICD-10-CM | POA: Diagnosis not present

## 2019-02-04 DIAGNOSIS — J9601 Acute respiratory failure with hypoxia: Secondary | ICD-10-CM

## 2019-02-04 DIAGNOSIS — I361 Nonrheumatic tricuspid (valve) insufficiency: Secondary | ICD-10-CM | POA: Diagnosis not present

## 2019-02-04 DIAGNOSIS — B952 Enterococcus as the cause of diseases classified elsewhere: Secondary | ICD-10-CM | POA: Diagnosis not present

## 2019-02-04 LAB — GLUCOSE, CAPILLARY
Glucose-Capillary: 127 mg/dL — ABNORMAL HIGH (ref 70–99)
Glucose-Capillary: 128 mg/dL — ABNORMAL HIGH (ref 70–99)
Glucose-Capillary: 142 mg/dL — ABNORMAL HIGH (ref 70–99)
Glucose-Capillary: 153 mg/dL — ABNORMAL HIGH (ref 70–99)

## 2019-02-04 LAB — COMPREHENSIVE METABOLIC PANEL
ALT: 34 U/L (ref 0–44)
AST: 32 U/L (ref 15–41)
Albumin: 2.1 g/dL — ABNORMAL LOW (ref 3.5–5.0)
Alkaline Phosphatase: 276 U/L — ABNORMAL HIGH (ref 38–126)
Anion gap: 11 (ref 5–15)
BUN: 20 mg/dL (ref 6–20)
CO2: 33 mmol/L — ABNORMAL HIGH (ref 22–32)
Calcium: 8.9 mg/dL (ref 8.9–10.3)
Chloride: 103 mmol/L (ref 98–111)
Creatinine, Ser: 0.9 mg/dL (ref 0.44–1.00)
GFR calc Af Amer: >60 mL/min (ref >60)
GFR calc non Af Amer: >60 mL/min (ref >60)
Glucose, Bld: 142 mg/dL — ABNORMAL HIGH (ref 70–99)
Potassium: 4 mmol/L (ref 3.5–5.1)
Sodium: 147 mmol/L — ABNORMAL HIGH (ref 135–145)
Total Bilirubin: 0.5 mg/dL (ref 0.3–1.2)
Total Protein: 5.4 g/dL — ABNORMAL LOW (ref 6.5–8.1)

## 2019-02-04 LAB — CBC
HCT: 26.6 % — ABNORMAL LOW (ref 36.0–46.0)
Hemoglobin: 7.8 g/dL — ABNORMAL LOW (ref 12.0–15.0)
MCH: 26.7 pg (ref 26.0–34.0)
MCHC: 29.3 g/dL — ABNORMAL LOW (ref 30.0–36.0)
MCV: 91.1 fL (ref 80.0–100.0)
Platelets: 156 10*3/uL (ref 150–400)
RBC: 2.92 MIL/uL — ABNORMAL LOW (ref 3.87–5.11)
RDW: 19.1 % — ABNORMAL HIGH (ref 11.5–15.5)
WBC: 12 10*3/uL — ABNORMAL HIGH (ref 4.0–10.5)
nRBC: 0 % (ref 0.0–0.2)

## 2019-02-04 LAB — ECHOCARDIOGRAM LIMITED
Height: 62 in
Weight: 2483.26 oz

## 2019-02-04 LAB — POCT I-STAT 7, (LYTES, BLD GAS, ICA,H+H)
Acid-Base Excess: 13 mmol/L — ABNORMAL HIGH (ref 0.0–2.0)
Bicarbonate: 39.4 mmol/L — ABNORMAL HIGH (ref 20.0–28.0)
Calcium, Ion: 1.18 mmol/L (ref 1.15–1.40)
HCT: 25 % — ABNORMAL LOW (ref 36.0–46.0)
Hemoglobin: 8.5 g/dL — ABNORMAL LOW (ref 12.0–15.0)
O2 Saturation: 97 %
Patient temperature: 101.2
Potassium: 3.3 mmol/L — ABNORMAL LOW (ref 3.5–5.1)
Sodium: 146 mmol/L — ABNORMAL HIGH (ref 135–145)
TCO2: 41 mmol/L — ABNORMAL HIGH (ref 22–32)
pCO2 arterial: 67.5 mmHg (ref 32.0–48.0)
pH, Arterial: 7.38 (ref 7.350–7.450)
pO2, Arterial: 102 mmHg (ref 83.0–108.0)

## 2019-02-04 LAB — HEPARIN LEVEL (UNFRACTIONATED): Heparin Unfractionated: 1.22 IU/mL — ABNORMAL HIGH (ref 0.30–0.70)

## 2019-02-04 LAB — APTT: aPTT: 46 seconds — ABNORMAL HIGH (ref 24–36)

## 2019-02-04 LAB — MAGNESIUM: Magnesium: 1.9 mg/dL (ref 1.7–2.4)

## 2019-02-04 LAB — PROTIME-INR
INR: 1.5 — ABNORMAL HIGH (ref 0.8–1.2)
Prothrombin Time: 17.8 seconds — ABNORMAL HIGH (ref 11.4–15.2)

## 2019-02-04 MED ORDER — DIPHENHYDRAMINE HCL 50 MG/ML IJ SOLN: 25.0000 mg | INTRAMUSCULAR | Status: DC | PRN

## 2019-02-04 MED ORDER — NOREPINEPHRINE 4 MG/250ML-% IV SOLN
0.0000 ug/min | INTRAVENOUS | Status: DC
  Administered 2019-02-04: 14:00:00 5.013 ug/min via INTRAVENOUS

## 2019-02-04 MED ORDER — GLYCOPYRROLATE 0.2 MG/ML IJ SOLN: 0.2000 mg | INTRAMUSCULAR | Status: DC | PRN

## 2019-02-04 MED ORDER — FENTANYL CITRATE (PF) 100 MCG/2ML IJ SOLN: 50.0000 ug | INTRAMUSCULAR | Status: DC | PRN

## 2019-02-04 MED ORDER — GLYCOPYRROLATE 1 MG PO TABS: 1.0000 mg | ORAL_TABLET | ORAL | Status: DC | PRN

## 2019-02-04 MED ORDER — MIDAZOLAM HCL 2 MG/2ML IJ SOLN
INTRAMUSCULAR | Status: AC
  Filled 2019-02-04: qty 4

## 2019-02-04 MED ORDER — FENTANYL CITRATE (PF) 100 MCG/2ML IJ SOLN
INTRAMUSCULAR | Status: AC
  Filled 2019-02-04: qty 2

## 2019-02-04 MED ORDER — MIDAZOLAM HCL 2 MG/2ML IJ SOLN: 2.0000 mg | INTRAMUSCULAR | Status: DC | PRN

## 2019-02-04 MED ORDER — MAGNESIUM SULFATE 2 GM/50ML IV SOLN: 2.0000 g | Freq: Once | INTRAVENOUS | Status: DC

## 2019-02-04 MED ORDER — LORAZEPAM 2 MG/ML IJ SOLN
2.0000 mg | INTRAMUSCULAR | Status: DC | PRN
  Administered 2019-02-04: 4 mg via INTRAVENOUS
  Filled 2019-02-04: qty 2

## 2019-02-04 MED ORDER — NOREPINEPHRINE 4 MG/250ML-% IV SOLN
INTRAVENOUS | Status: AC
  Administered 2019-02-04: 5.013 ug/min via INTRAVENOUS
  Filled 2019-02-04: qty 250

## 2019-02-04 MED ORDER — ACETAMINOPHEN 325 MG PO TABS: 650.0000 mg | ORAL_TABLET | Freq: Four times a day (QID) | ORAL | Status: DC | PRN

## 2019-02-04 MED ORDER — ACETAMINOPHEN 650 MG RE SUPP: 650.0000 mg | Freq: Four times a day (QID) | RECTAL | Status: DC | PRN

## 2019-02-04 MED ORDER — GLYCOPYRROLATE 0.2 MG/ML IJ SOLN
0.2000 mg | INTRAMUSCULAR | Status: DC | PRN
  Administered 2019-02-04: 0.2 mg via INTRAVENOUS
  Filled 2019-02-04: qty 1

## 2019-02-04 MED ORDER — MORPHINE BOLUS VIA INFUSION
5.0000 mg | INTRAVENOUS | Status: DC | PRN
  Filled 2019-02-04: qty 5

## 2019-02-04 MED ORDER — FUROSEMIDE 10 MG/ML IJ SOLN: 40.0000 mg | Freq: Once | INTRAMUSCULAR | Status: DC

## 2019-02-04 MED ORDER — MORPHINE SULFATE (PF) 2 MG/ML IV SOLN: 2.0000 mg | INTRAVENOUS | Status: DC | PRN

## 2019-02-04 MED ORDER — SODIUM BICARBONATE 8.4 % IV SOLN
INTRAVENOUS | Status: DC
  Filled 2019-02-04: qty 100

## 2019-02-04 MED ORDER — DEXTROSE 5 % IV SOLN: INTRAVENOUS | Status: DC

## 2019-02-04 MED ORDER — WARFARIN SODIUM 5 MG PO TABS: 5.0000 mg | ORAL_TABLET | Freq: Once | ORAL | Status: DC

## 2019-02-04 MED ORDER — POLYVINYL ALCOHOL 1.4 % OP SOLN
1.0000 [drp] | Freq: Four times a day (QID) | OPHTHALMIC | Status: DC | PRN
  Filled 2019-02-04: qty 15

## 2019-02-04 MED ORDER — MORPHINE 100MG IN NS 100ML (1MG/ML) PREMIX INFUSION
0.0000 mg/h | INTRAVENOUS | Status: DC
  Administered 2019-02-04: 2 mg/h via INTRAVENOUS
  Filled 2019-02-04: qty 100

## 2019-02-05 MED FILL — Medication: Qty: 1 | Status: AC

## 2019-03-03 NOTE — Progress Notes (Signed)
Time of death 16:46

## 2019-03-03 NOTE — Progress Notes (Signed)
  Echocardiogram 2D Echocardiogram has been performed.  Audrey Collier 02/14/2019, 2:27 PM

## 2019-03-03 NOTE — Consult Note (Signed)
NAME:  Audrey Collier, MRN:  [id number], DOB:  1960/08/31, LOS: 12 ADMISSION DATE:  01/03/2019, CONSULTATION DATE:  22-Feb-2019 REFERRING MD:  Triad MD, CHIEF COMPLAINT:  resp ditress   Brief History   See below  History of present illness   59 year old white lady with complicated medical history.  History is obtained from review of the chart and talking to the bedside nurse and rapid response nurse.  It appears that patient was admitted between October 01, 2018 and December 06, 2018 [prolonged 34-month hospitalization] for fever and acute encephalopathy and found to have a vegetation on her aortic valve and underwent aortic valve replacement with bioprosthetic valve on October 07, 2018.  Ejection fraction at that time was 55%.  She was briefly intubated for this admission.  The course was complicated by acute kidney injury, urinary retention, elevated liver function tests [now normalized] and PEG tube placement.  She also received blood transfusions.  She was initially DNR  In the last 2 months has been hospitalized twice.  The first was early October for pneumonia and heart failure.  After that she went to rehabilitation.  Then she got admitted again later in October for possible pneumonia and heart failure.  During this time she was noted to have a small pulmonary embolism and Eliquis was started.  On this admission she presented to outside hospital on January 20, 2019 3 days before transfer here.  Initial presentation was acute encephalopathy and fever.  Chest x-ray showed bilateral airspace disease.  Covid was negative procalcitonin elevated per chart.  Blood cultures grew Enterococcus and she was started on vancomycin.  Urine was positive for ESBL UTI.  Echocardiogram showed mitral valve endocarditis.  She did get 1 unit of blood at the outside hospital.  She was then transferred here this admission January 23, 2019.  Here infectious diseases and cardiology has been on board.  At the time of transfer  she was on meropenem.  After transfer she is started on ampicillin and ceftriaxone.  She also got 1 dose of fosfomycin.  In the work-up.  Post admission in January 25, 2019 she underwent a TEE and this confirmed mitral valve vegetation.On February 01, 2019 she underwent a CT scan of her coronaries.  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  At this point the Eliquis for pulmonary embolism was converted to IV heparin with Coumadin  From a respiratory standpoint since admission she has had a slowly worsening chest x-ray but even as of this morning was requiring 2 L nasal cannula not in distress.  Then developed abrupt onset of respiratory distress breathing 30 times a minute and paradoxical and needing high flow nasal cannula but still pulse ox only 78%.  She has been given 1 dose of Lasix and pulmonary critical care has been consulted for ICU transfer.  She has been n.p.o.  Apparently she is at aspiration risk but has not had any witnessed aspiration though it was suspected.  She is currently full code. Past Medical History     has a past medical history of Anxiety, Dizziness, Endometrial polyp (03/26/2014), Fecal occult blood test positive (03/15/2013), GERD (gastroesophageal reflux disease), Hypothyroidism, Mental disorder, PMB (postmenopausal bleeding) (03/26/2014), and Thickened endometrium (03/26/2014).   reports that she has never smoked. She has never used smokeless tobacco.  Past Surgical History:  Procedure Laterality Date  . AORTIC VALVE REPLACEMENT N/A 10/07/2018   Procedure: AORTIC  VALVE REPLACEMENT (AVR);  Surgeon: Corliss Skains, MD;  Location: [facility] OR;  Service: Open Heart Surgery;  Laterality: N/A;  . BIOPSY  11/03/2018   Procedure: BIOPSY;  Surgeon: Tressia Danas, MD;  Location[facility] ENDOSCOPY;  Service: Gastroenterology;;  . CATARACT EXTRACTION W/PHACO Left 08/06/2017   Procedure:  CATARACT EXTRACTION PHACO AND INTRAOCULAR LENS PLACEMENT LEFT EYE;  Surgeon: Fabio Pierce, MD;  Location: [facility] ORS;  Service: Ophthalmology;  Laterality: Left;  CDE: 12.63  . CATARACT EXTRACTION W/PHACO Right 10/01/2017   Procedure: CATARACT EXTRACTION PHACO AND INTRAOCULAR LENS PLACEMENT (IOC);  Surgeon: Fabio Pierce, MD;  Location: [facility] ORS;  Service: Ophthalmology;  Laterality: Right;  CDE: 5.77  . COLONOSCOPY N/A 12/01/2013   Dr. Fields:moderate sized internal hemorrhoids/left colon is redundant  . DILITATION & CURRETTAGE/HYSTROSCOPY WITH NOVASURE ABLATION N/A 04/18/2014   Procedure: DILATATION & CURETTAGE/HYSTEROSCOPY WITH NOVASURE ENDOMETRIAL ABLATION;  Surgeon: Lazaro Arms, MD;  Location: [facility] ORS;  Service: Gynecology;  Laterality: N/A;  Uterine Cavity Length 5cm     . ESOPHAGOGASTRODUODENOSCOPY N/A 12/01/2013   [facility]:WHQP non erosive gastritis. +H.pylori   . ESOPHAGOGASTRODUODENOSCOPY (EGD) WITH PROPOFOL N/A 11/03/2018   Procedure: ESOPHAGOGASTRODUODENOSCOPY (EGD) WITH PROPOFOL;  Surgeon: Tressia Danas, MD;  Location: [facility] ENDOSCOPY;  Service: Gastroenterology;  Laterality: N/A;  . IR GASTROSTOMY TUBE MOD SED  11/04/2018  . NO PAST SURGERIES    . POLYPECTOMY N/A 04/18/2014   Procedure: ENDOMETRIAL POLYPECTOMY;  Surgeon: Lazaro Arms, MD;  Location: [facility] ORS;  Service: Gynecology;  Laterality: N/A;  . TEE WITHOUT CARDIOVERSION N/A 10/07/2018   Procedure: TRANSESOPHAGEAL ECHOCARDIOGRAM (TEE);  Surgeon: Corliss Skains, MD;  Location[facility] OR;  Service: Open Heart Surgery;  Laterality: N/A;  . TEE WITHOUT CARDIOVERSION N/A 2019-02-17   Procedure: TRANSESOPHAGEAL ECHOCARDIOGRAM (TEE);  Surgeon: Chilton Si, MD;  Location: [facility] ENDOSCOPY;  Service: Cardiovascular;  Laterality: N/A;    No Known Allergies  Immunization History  Administered Date(s) Administered  . Influenza,inj,Quad PF,6+ Mos 12/06/2018    Family History  Problem Relation Age of Onset  . Diabetes Maternal Aunt   .  Hypertension Maternal Aunt   . Diabetes Maternal Uncle   . Colon cancer Neg Hx      Current Facility-Administered Medications:  .  acetaminophen (TYLENOL) tablet 650 mg, 650 mg, Oral, Q6H PRN, 650 mg at 01/31/19 1202 **OR** acetaminophen (TYLENOL) suppository 650 mg, 650 mg, Rectal, Q6H PRN, Chilton Si, MD, 650 mg at 01/28/19 2225 .  ampicillin (OMNIPEN) 2 g in sodium chloride 0.9 % 100 mL IVPB, 2 g, Intravenous, Q4H, Scarlett Presto, RPH, Last Rate: 300 mL/hr at 03/01/2019 1130, 2 g at 02/15/2019 1130 .  cefTRIAXone (ROCEPHIN) 2 g in sodium chloride 0.9 % 100 mL IVPB, 2 g, Intravenous, Q12H, Chilton Si, MD, Last Rate: 200 mL/hr at 02/03/2019 1237, 2 g at 02/18/2019 1237 .  Chlorhexidine Gluconate Cloth 2 % PADS 6 each, 6 each, Topical, Daily, Chilton Si, MD, 6 each at 02/14/2019 1008 .  feeding supplement (OSMOLITE 1.5 CAL) liquid 1,000 mL, 1,000 mL, Per Tube, Continuous, Samtani, Jai-Gurmukh, MD, Last Rate: 50 mL/hr at 02/03/19 1515, 1,000 mL at 02/03/19 1515 .  feeding supplement (PRO-STAT SUGAR FREE 64) liquid 30 mL, 30 mL, Per Tube, TID, Samtani, Jai-Gurmukh, MD, 30 mL at 02/15/2019 1005 .  FLUoxetine (PROZAC) capsule 10 mg, 10 mg, Per Tube, QHS, Chilton Si, MD, 10 mg at 02/03/19 2143 .  free water 100 mL, 100 mL, Per Tube, Q6H, Nettey, Howell Pringle, MD, 100 mL at  February 27, 2019 1239 .  furosemide (LASIX) injection 40 mg, 40 mg, Intravenous, BID, Narda Bonds, MD, 40 mg at 02/27/19 0859 .  heparin ADULT infusion 100 units/mL (25000 units/253mL sodium chloride 0.45%), 950 Units/hr, Intravenous, Continuous, Carney, Gwenlyn Found, RPH, Last Rate: 9.5 mL/hr at 02/27/19 0933, 950 Units/hr at 27-Feb-2019 0933 .  ipratropium (ATROVENT) nebulizer solution 0.5 mg, 0.5 mg, Nebulization, TID, Hall, Carole N, DO, 0.5 mg at 02/27/19 0737 .  levalbuterol (XOPENEX) nebulizer solution 0.63 mg, 0.63 mg, Nebulization, TID, Hall, Carole N, DO, 0.63 mg at 02/27/2019 0737 .  levothyroxine (SYNTHROID) tablet 150  mcg, 150 mcg, Per Tube, [id number], Chilton Si, MD, 150 mcg at 02-27-19 0550 .  MEDLINE mouth rinse, 15 mL, Mouth Rinse, BID, Chilton Si, MD, 15 mL at 02/27/2019 1003 .  metoCLOPramide (REGLAN) 5 MG/5ML solution 10 mg, 10 mg, Per Tube, TID AC & HS, Chilton Si, MD, 10 mg at 02-27-19 0807 .  metoprolol tartrate (LOPRESSOR) 25 mg/10 mL oral suspension 25 mg, 25 mg, Per Tube, BID, Hall, Carole N, DO, 25 mg at 02/27/19 1004 .  metoprolol tartrate (LOPRESSOR) injection 10 mg, 10 mg, Intravenous, Q5 min PRN, Lars Masson, MD, 10 mg at 02/01/19 1421 .  ondansetron (ZOFRAN) tablet 4 mg, 4 mg, Oral, Q6H PRN, 4 mg at 01/28/19 0443 **OR** ondansetron (ZOFRAN) injection 4 mg, 4 mg, Intravenous, Q6H PRN, Chilton Si, MD, 4 mg at 02/02/19 1357 .  pantoprazole sodium (PROTONIX) 40 mg/20 mL oral suspension 40 mg, 40 mg, Per Tube, Daily, Chilton Si, MD, 40 mg at Feb 27, 2019 1009 .  polyethylene glycol (MIRALAX / GLYCOLAX) packet 17 g, 17 g, Per Tube, Q M,W,F, Chilton Si, MD, 17 g at 01/02/2019 1028 .  rosuvastatin (CRESTOR) tablet 5 mg, 5 mg, Per Tube, Daily, Chilton Si, MD, 5 mg at 02/27/19 1009 .  sodium chloride flush (NS) 0.9 % injection 10-40 mL, 10-40 mL, Intracatheter, PRN, Rhetta Mura, MD, 10 mL at 01/31/19 0939 .  tamsulosin (FLOMAX) capsule 0.4 mg, 0.4 mg, Oral, Daily, Hall, Carole N, DO, 0.4 mg at 2019/02/27 1006 .  warfarin (COUMADIN) tablet 5 mg, 5 mg, Oral, ONCE-1800, Carney, Gwenlyn Found, RPH .  Warfarin - Pharmacist Dosing Inpatient, , Does not apply, q1800, Gardner Candle, [facility]   Significant Hospital Events   01/11/2019 - admit  Consults:   -January 23, 2019  -infectious diseases and cardiology  Procedures:  x  Significant Diagnostic Tests:  January 25, 2019-TEE February 01, 2019-CT scan of the chest  Micro Data:   Antimicrobials:   Ampicillin 01/22/2019 Flagyl oral January 28, 2019-February 02, 2019 Ceftriaxone 11/2 05/2018  Fosfomycin 1 dose-01/10/2019 Meropenem-01/2019 outside hospital-01/21/2019     Interim history/subjective:  *x  Objective   Blood pressure 104/76, pulse (!) 116, temperature 98.9 F (37.2 C), temperature source Oral, resp. rate (!) 24, height  (1.575 m), weight 70.4 kg, last menstrual period 04/16/2013, SpO2 95 %.    FiO2 (%):  [100 %] 100 %   Intake/Output Summary (Last 24 hours) at 02-27-2019 1240 Last data filed at 02-27-19 0600 Gross per 24 hour  Intake 2839.75 ml  Output 900 ml  Net 1939.75 ml   Filed Weights   02/02/19 0430 02/03/19 0445 02/27/19 0322  Weight: 72.2 kg 70.9 kg 70.4 kg    Examination: General: Frail female sitting in the bed with significant respiratory distress HENT: Has a moderate cough reflex.  No neck nodes no elevated JVP Lungs: Respiratory paradoxical and crackly Cardiovascular: Tachycardic normal heart  sounds Abdomen: Soft nontender.  Has PEG tube Extremities: Appears to have edema Neuro: Alert and answering simple questions GU: Appears intact  Resolved Hospital Problem list   x  Assessment & Plan:    ASSESSMENT / PLAN:   A:  Hx of PE in 2020 Acute respiratory failure with hypoxemia and worsening pulmonary infiltrates in the setting of mitral valve and tricuspid valve endocarditis and Enterococcus faecalis UTI    P:   Transfer to the intensive care unit At them short course BiPAP to see if it rapidly turns around Lasix x1 If deteriorates intubate Check ECHO for valve function  A:   Hx of developmental delay Hx of encephalopathy  Alert can answer simple questions but at risk for acute encephalopathy given previous history with this P:   Monitor Dc prozac    A:   Maintaining normal blood pressure but at risk for cardiogenic shock and septic shock  P:  Mean artery pressure goal greater than 65   A: [place] valve bioprosthetic valve August 2020 -now with superimposed endocarditis and thrombosis -this admit  Mitral valve endocarditis with admit   P: Cardiology and infectious diseases following Iv heparin gtt   A: Tachycardic   P: Monitor   A:   Endocarditis and UTI P:   Per infectious diseases   A:  At risk for AKI but currently creatinine normal P:  Monitor   A:  Mild hypernatremia P: monitor    A:   Dysphagia  P:   Npo TF if gets intubated PPI if she gets intubated   A:  Anemia chronic and critical illness   P:  - PRBC for hgb </= 6.9gm%    - exceptions are   -  if ACS susepcted/confirmed then transfuse for hgb </= 8.0gm%,  or    -  active bleeding with hemodynamic instability, then transfuse regardless of hemoglobin value   At at all times try to transfuse 1 unit prbc as possible with exception of active hemorrhage      A:   At risk hyperglycemia   P:   ssi  MSK/DERM A risk sacra decub  Plan  - frequetn turning   Best practice:  Diet: npo Pain/Anxiety/Delirium protocol (if indicated): pad protocol if intubated VAP protocol (if indicated): hob > 30 DVT prophylaxis: Iv heparin gtt GI prophylaxis: ppi if intubaed Glucose control: ssi Mobility: bed rest Code Status: full code Family Communication: aunt called - but no answer Disposition:  Move to ICU     [place]   The patient Audrey Collier is critically ill with multiple organ systems failure and requires high complexity decision making for assessment and support, frequent evaluation and titration of therapies, application of advanced monitoring technologies and extensive interpretation of multiple databases.   Critical Care Time devoted to patient care services described in this note is  40  Minutes. This time reflects time of care of this signee Dr Brand Males. This critical care time does not reflect procedure time, or teaching time or supervisory time of PA/NP/Med student/Med Resident etc but could involve care discussion time     Dr. Brand Males, M.D., [facility].C.P Pulmonary and Critical Care Medicine Staff Physician [place] [facility] Pager: [phone], If no answer or between  15:00h - 7:00h: call [phone]  02/10/2019 12:40 PM    LABS    PULMONARY Recent Labs  Lab 02/03/19 0545  PHART 7.362  PCO2ART 62.4*  PO2ART 78.7*  HCO3 34.8*  O2SAT 95.9    CBC Recent Labs  Lab 02/02/19 0500 02/03/19 0528 02/09/2019 0618  HGB 8.0* 8.0* 7.8*  HCT 27.3* 27.8* 26.6*  WBC 10.9* 10.6* 12.0*  PLT 152 151 156    COAGULATION Recent Labs  Lab 02/03/19 1500 02/26/2019 0618  INR 1.5* 1.5*    CARDIAC  No results for input(s): TROPONINI in the last 168 hours. No results for input(s): PROBNP in the last 168 hours.   CHEMISTRY Recent Labs  Lab 01/31/19 0500 02/01/19 0500 02/02/19 0500 02/03/19 0528 02/19/2019 0618  NA 143 143 142 145 147*  K 4.4 4.4 4.0 4.0 4.0  CL 103 104 100 103 103  CO2 28 32 33* 32 33*  GLUCOSE 100* 105* 106* 93 142*  BUN 30* 25* 24* 16 20  CREATININE 0.83 0.75 0.83 0.87 0.90  CALCIUM 9.2 9.1 8.7* 9.3 8.9  MG  --   --  1.8 1.9 1.9   Estimated Creatinine Clearance: 63.4 mL/min (by C-G formula based on SCr of 0.9 mg/dL).   LIVER Recent Labs  Lab 02/02/19 0500 02/03/19 0528 02/03/19 1500 02/22/2019 0618  AST 62* 33  --  32  ALT 52* 41  --  34  ALKPHOS 329* 294*  --  276*  BILITOT 0.5 0.4  --  0.5  PROT 5.1* 5.4*  --  5.4*  ALBUMIN 2.0* 2.1*  --  2.1*  INR  --   --  1.5* 1.5*     INFECTIOUS Recent Labs  Lab 02/01/19 0820 02/03/19 0528 02/03/19 0602  LATICACIDVEN  --   --  0.6  PROCALCITON 0.19 0.30  --      ENDOCRINE CBG (last 3)  Recent Labs    02/02/2019 0319 03/02/2019 0745 02/23/2019 1129  GLUCAP 127* 142* 153*         IMAGING x48h  - image(s) personally visualized  -   highlighted in bold Dg Chest Port 1 View  Result Date: 02/17/2019 CLINICAL DATA:  Short of breath EXAM: PORTABLE CHEST 1 VIEW COMPARISON:   Radiograph 02/02/2019 FINDINGS: Sternotomy wires overlies stable enlarged cardiac silhouette. Bilateral small to moderate pleural effusions. There is bilateral nodular airspace disease which is increased in distribution compared to prior with new involvement of the RIGHT upper lobe. Lower lobe nodules appear slightly more dense. IMPRESSION: 1. Worsening bilateral nodular airspace disease with new involvement of the RIGHT upper lobe. Findings most suggestive of a progressive multifocal pneumonia. 2. Small to moderate bilateral pleural effusions. These results will be called to the floor nurse by the Radiologist Assistant, and communication documented in the PACS or zVision Dashboard. Electronically Signed   By: Genevive BiStewart  Edmunds M.D.   On: 02/06/2019 11:37

## 2019-03-03 NOTE — Progress Notes (Signed)
Pt. Sat level dropped I put  the pt. On a non rebreather  Sat levels would only stay in the mid 80s I called the doctor got orders for a chest x-ray and 40 lasix.

## 2019-03-03 NOTE — Progress Notes (Signed)
Attempted to notify family of pt passing, however no one answered. Will attempt to reach someone again.

## 2019-03-03 NOTE — Procedures (Addendum)
PATIENT NAME: Audrey Collier MEDICAL RECORD NUMBER: [id number] Birthday: 12-30-1960  Age: 59 y.o. Admit Date: 01/22/2019  Provider:  Brand Males and bedsuide team and code team  Indication: PEA  Technical Description: Saw patient in 4E annd ordered transfer . Did note. Patient arrived in  andimmediately upon transfer to bed arrested right as I walked into see her   CPR performance duration:  approx 11 minuets  Was defibrillation or cardioversion used ? no  Was external pacer placed ? no  Was patient intubated pre/post CPR ? Peri cpr  Was transvenous pacer placed ? no  Medications Administered Include      Yes/no Amiodarone no  Atropin no  Calcium no  Epinephrine Yes x 3  Lidocaine no  Magnesium Yrs x 1  Norepinephrine no  Phenylephrine no  Sodium bicarbonate Yes x 1 and then gtt ordered  Vasopression no   Evaluation  Final Status - Was patient successfully resuscitated ? yes  If successfully resuscitated - what is current rhythm ? sinus If successfully resuscitated - what is current hemodynamic status ? Normal BP but might be dropping  Miscellaneous Information Blood seen via ET tube   SIGNATURE    Dr. Brand Males, M.D., F.C.C.P,  Pulmonary and Critical Care Medicine Staff Physician, [place] Director - Interstitial Lung Disease  Program  Pulmonary [place] at [place], [place], [address]  Pager: [phone], If no answer or between  15:00h - 7:00h: call [phone] Telephone: [phone]  1:48 PM 01/31/2019

## 2019-03-03 NOTE — Progress Notes (Addendum)
PROGRESS NOTE  Audrey Collier ZDG:[id number] DOB: 18-Dec-1960 DOA: 2019/01/29 PCP: Jani Gravel, MD  HPI/Recap of past 24 hours: PT seen and examined at bed side . She said she felt fine . Later nurse reported pt desaturated to the 70 % and had to be put on HF [place]  @14  . It was later changed to heated HF O2 AT 100% 35L/M AND SHE IS JUST SATURATING AT 88% SHE IS TACHYPNIC RR OF 32 ,HR 120 AND RAPID RESPONSE WAS CALLED   This is a [age] year old female with history of developmental delay, hypothyroidism admitted in August 2020 for MSSA bacteremia, aortic valve vegetation who underwent surgical aortic valve replacement, also had NG tube placement which eventually was removed and transferred for rehab she completed her course of antibiotic, at rehab she became confused and was brought to an outside hospital where she was diagnosed with ESBL UTI echocardiogram showed mitral valve vegetation so she was transferred to [facility] hospital.  She had TEE completed on January 25, 2019 and was seen by cardiology and ID plan was to complete Rocephin and ampicillin on March 05, 2019 there was concern for aspiration pneumonia and so Flagyl IV was added she was evaluated by speech and recommended dysphagia 1 diet she had a respiratory event on February 02, 2019 with chest x-ray suggestive of pulmonary edema and bilateral pulmonary infiltrate and possible aspiration pneumonia for which the patient was made n.p.o. to avoid further complication.  On February 03, 2019 she had complained of difficulty breathing ABG was done and PCCM was consulted curbside and he had advised to keep her n.p.o. and no BiPAP with suspected aspiration     Assessment/Plan: Principal Problem:   Bacteremia due to Enterococcus Active Problems:   Acute hypoxemic respiratory failure ([name])   S/P AVR (aortic valve replacement)   Nausea & vomiting   Bacteriuria, ESBL Klebsiella    Malnutrition of moderate degree   Endocarditis of prosthetic valve  ([name])   Acute exacerbation of CHF (congestive heart failure) ([name])  ACUTE RESPIRATORY FAILURE on HF O2 AT 100% 35L/M AND SHE IS JUST SATURATING AT 88% SHE IS TACHYPNIC RR OF 32 ,HR 120   RAPID RESPONSE WAS CALLED  PCCM CONSULT CALLLED   TRANFER TO ICU  Attempted to call patient's daughter and Ms. [name] to update on change in her condition, there was no answer to the phone and I left a HIPAA compliant message  Code Status: FULL  Severity of Illness: The appropriate patient status for this patient is INPATIENT. Inpatient status is judged to be reasonable and necessary in order to provide the required intensity of service to ensure the patient's safety. The patient's presenting symptoms, physical exam findings, and initial radiographic and laboratory data in the context of their chronic comorbidities is felt to place them at high risk for further clinical deterioration. Furthermore, it is not anticipated that the patient will be medically stable for discharge from the hospital within 2 midnights of admission. The following factors support the patient status of inpatient.   " The patient's presenting symptoms include The worrisome physical exam findings include RESPIRATORY DISTRESS " The initial radiographic and laboratory data are worrisome because of HYPOXIA " The chronic co-morbidities include BIL PNA   * I certify that at the point of admission it is my clinical judgment that the patient will require inpatient hospital care spanning beyond 2 midnights from the point of admission due to high intensity of service, high risk for further deterioration  and high frequency of surveillance required.    Family Communication: Myriam ForehandCalled [name] and left a message  Disposition Plan: Transfer to ICU, PCCM assumed care   [facility]  Procedures:  Intubation  Antimicrobials:  None  DVT prophylaxis: Eliquis   Objective: Vitals:   02/12/2019 0322 02/17/2019 0741 02/22/2019 0752  02/24/2019 1015  BP: 112/74  104/76 104/76  Pulse: (!) 110  (!) 123 (!) 116  Resp: 20  (!) 28 (!) 24  Temp: 99.2 F (37.3 C)  98.9 F (37.2 C)   TempSrc: Oral  Oral   SpO2: 96% 95% 94% 95%  Weight: 70.4 kg     Height:        Intake/Output Summary (Last 24 hours) at 02/18/2019 1034 Last data filed at 02/03/2019 0600 Gross per 24 hour  Intake 2839.75 ml  Output 900 ml  Net 1939.75 ml   Filed Weights   02/02/19 0430 02/03/19 0445 02/21/2019 0322  Weight: 72.2 kg 70.9 kg 70.4 kg   Body mass index is 28.39 kg/m.  Exam:  . General: 59 y.o. year-old female well developed well nourished in no acute distress.  Alert and oriented x3. . Cardiovascular: Regular rate and rhythm with no rubs or gallops.  No thyromegaly or JVD noted.   [name]. Respiratory: Clear to auscultation with no wheezes or rales. Good inspiratory effort. . Abdomen: Soft nontender nondistended with normal bowel sounds x4 quadrants. . Musculoskeletal: No lower extremity edema. 2/4 pulses in all 4 extremities. . Skin: No ulcerative lesions noted or rashes, . Psychiatry: Mood is appropriate for condition and setting    Data Reviewed: FINDINGS: Sternotomy wires overlies stable enlarged cardiac silhouette. Bilateral small to moderate pleural effusions.  There is bilateral nodular airspace disease which is increased in distribution compared to prior with new involvement of the RIGHT upper lobe. Lower lobe nodules appear slightly more dense.  IMPRESSION: 1. Worsening bilateral nodular airspace disease with new involvement of the RIGHT upper lobe. Findings most suggestive of a progressive multifocal pneumonia. 2. Small to moderate bilateral pleural effusions.  These results will be called to the floor nurse by the Radiologist Assistant, and communication documented in the PACS or zVision Dashboard.    CBC: Recent Labs  Lab 02/02/19 0500 02/03/19 0528 02/19/2019 0618  WBC 10.9* 10.6* 12.0*  HGB 8.0* 8.0* 7.8*   HCT 27.3* 27.8* 26.6*  MCV 89.2 91.4 91.1  PLT 152 151 156   Basic Metabolic Panel: Recent Labs  Lab 01/31/19 0500 02/01/19 0500 02/02/19 0500 02/03/19 0528 02/12/2019 0618  NA 143 143 142 145 147*  K 4.4 4.4 4.0 4.0 4.0  CL 103 104 100 103 103  CO2 28 32 33* 32 33*  GLUCOSE 100* 105* 106* 93 142*  BUN 30* 25* 24* 16 20  CREATININE 0.83 0.75 0.83 0.87 0.90  CALCIUM 9.2 9.1 8.7* 9.3 8.9  MG  --   --  1.8 1.9 1.9   GFR: Estimated Creatinine Clearance: 63.4 mL/min (by C-G formula based on SCr of 0.9 mg/dL). Liver Function Tests: Recent Labs  Lab 02/02/19 0500 02/03/19 0528 02/28/2019 0618  AST 62* 33 32  ALT 52* 41 34  ALKPHOS 329* 294* 276*  BILITOT 0.5 0.4 0.5  PROT 5.1* 5.4* 5.4*  ALBUMIN 2.0* 2.1* 2.1*   No results for input(s): LIPASE, AMYLASE in the last 168 hours. No results for input(s): AMMONIA in the last 168 hours. Coagulation Profile: Recent Labs  Lab 02/03/19 1500 02/17/2019 0618  INR 1.5* 1.5*  Cardiac Enzymes: No results for input(s): CKTOTAL, CKMB, CKMBINDEX, TROPONINI in the last 168 hours. BNP (last 3 results) No results for input(s): PROBNP in the last 8760 hours. HbA1C: No results for input(s): HGBA1C in the last 72 hours. CBG: Recent Labs  Lab 02/03/19 1646 02/03/19 2004 02/02/2019 0024 02/06/2019 0319 02/25/2019 0745  GLUCAP 95 115* 128* 127* 142*   Lipid Profile: No results for input(s): CHOL, HDL, LDLCALC, TRIG, CHOLHDL, LDLDIRECT in the last 72 hours. Thyroid Function Tests: No results for input(s): TSH, T4TOTAL, FREET4, T3FREE, THYROIDAB in the last 72 hours. Anemia Panel: No results for input(s): VITAMINB12, FOLATE, FERRITIN, TIBC, IRON, RETICCTPCT in the last 72 hours. Urine analysis:    Component Value Date/Time   COLORURINE YELLOW 11/20/2018 1029   APPEARANCEUR HAZY (A) 11/20/2018 1029   LABSPEC 1.013 11/20/2018 1029   PHURINE 6.0 11/20/2018 1029   GLUCOSEU NEGATIVE 11/20/2018 1029   HGBUR SMALL (A) 11/20/2018 1029    BILIRUBINUR NEGATIVE 11/20/2018 1029   KETONESUR NEGATIVE 11/20/2018 1029   PROTEINUR 100 (A) 11/20/2018 1029   UROBILINOGEN 0.2 04/13/2014 1030   NITRITE NEGATIVE 11/20/2018 1029   LEUKOCYTESUR SMALL (A) 11/20/2018 1029   Sepsis Labs: @LABRCNTIP (procalcitonin:4,lacticidven:4)  )No results found for this or any previous visit (from the past 240 hour(s)).    Studies: No results found.  Scheduled Meds: . Chlorhexidine Gluconate Cloth  6 each Topical Daily  . feeding supplement (PRO-STAT SUGAR FREE 64)  30 mL Per Tube TID  . FLUoxetine  10 mg Per Tube QHS  . free water  100 mL Per Tube Q6H  . furosemide  40 mg Intravenous BID  . ipratropium  0.5 mg Nebulization TID  . levalbuterol  0.63 mg Nebulization TID  . levothyroxine  150 mcg Per Tube Q0600  . mouth rinse  15 mL Mouth Rinse BID  . metoCLOPramide  10 mg Per Tube TID AC & HS  . metoprolol tartrate  25 mg Per Tube BID  . pantoprazole sodium  40 mg Per Tube Daily  . polyethylene glycol  17 g Per Tube Q M,W,F  . rosuvastatin  5 mg Per Tube Daily  . tamsulosin  0.4 mg Oral Daily  . warfarin  5 mg Oral ONCE-1800  . Warfarin - Pharmacist Dosing Inpatient   Does not apply q1800    Continuous Infusions: . ampicillin (OMNIPEN) IV 2 g (02/06/2019 0820)  . cefTRIAXone (ROCEPHIN)  IV 2 g (02/03/19 2144)  . feeding supplement (OSMOLITE 1.5 CAL) 1,000 mL (02/03/19 1515)  . heparin 950 Units/hr (02/12/2019 0933)     LOS: 12 days     14/05/20, MD [facility]  To reach me or the doctor on call, go to: [email or web address] Password [email or web address]  02/19/2019, 10:34 AM

## 2019-03-03 NOTE — Progress Notes (Signed)
Received report from 4E RN and pt was on the way. Upon arrival to unit rapid response RN instructed [place] to grab the RSI kit and call attending as pt was sating 45 on BiPap. Pt was also lethargic with mental status decreasing.   Pt began to brady down and MD came to bedside and intubated pt. Pt coded and compressions were started, and meds were given (see code sheet). ROSC was achieved.  NP called family to get consent for a central line and they informed NP pt would not want extraordinary measures done. NP spoke with family and they decided to move to comfort care. Pt is alert and following commands so I paged the chaplain 10 minutes ago and will page again shortly if no response.

## 2019-03-03 NOTE — Progress Notes (Signed)
ABG critical results given to Marni Griffon NP at 1445. PH- 7.38 PCO2-67.5 PO2- 102 HCO3-39.4  Ashley Mariner RRT

## 2019-03-03 NOTE — Progress Notes (Signed)
Time of death 16:46 2 RN's listened and pronounced Audrey Collier Comment RN and myself. PICC and foley removed and pt transferred to morgue. Family notified and donor services called.

## 2019-03-03 NOTE — Progress Notes (Signed)
Chaplain responded to code blue page. Audrey Collier's heart beat was reestablished by the rapid response team. [facility] Ron made efforts to contact family. Pt is stable at this time per MD. Bonney Roussel remains available for support as needs arise.   Chaplain Resident, Evelene Croon, [place] [phone]

## 2019-03-03 NOTE — Procedures (Signed)
Extubation Procedure Note  Patient Details:   Name: Audrey Collier DOB: [birth year] MRN: [id number]   Airway Documentation:    Vent end date: 02/13/2019 Vent end time: 1600   Evaluation  O2 sats: fell post extubation Complications: No apparent complications Patient did tolerate procedure well. Bilateral Breath Sounds: Coarse crackles   Patient extubated per MD for comfort care at family's request. Placed on 3L [place].  Kathie Dike 02/28/2019, 4:04 PM

## 2019-03-03 NOTE — Discharge Summary (Signed)
DISCHARGE SUMMARY    Date of admit: 01/18/2019  1:11 AM Date of discharge: 02-19-19  6:43 PM Length of Stay: 12 days  PCP is Jani Gravel, MD  CAUSE(S) OF DEATH  ENDOCARDITIS   PROBLEM LIST Principal Problem:   Bacteremia due to Enterococcus Active Problems:   Acute hypoxemic respiratory failure ([name])   S/P AVR (aortic valve replacement)   Nausea & vomiting   Bacteriuria, ESBL Klebsiella    Malnutrition of moderate degree   Endocarditis of prosthetic valve ([name])   Acute exacerbation of CHF (congestive heart failure) ([place])    SUMMARY Audrey Collier was 59 y.o. patient with    has a past medical history of Anxiety, Dizziness, Endometrial polyp (03/26/2014), Fecal occult blood test positive (03/15/2013), GERD (gastroesophageal reflux disease), Hypothyroidism, Mental disorder, PMB (postmenopausal bleeding) (03/26/2014), and Thickened endometrium (03/26/2014).   has a past surgical history that includes No past surgeries; Colonoscopy (N/A, 12/01/2013); Esophagogastroduodenoscopy (N/A, 12/01/2013); Dilatation & currettage/hysteroscopy with novasure ablation (N/A, 04/18/2014); polypectomy (N/A, 04/18/2014); Cataract extraction w/PHACO (Left, 08/06/2017); Cataract extraction w/PHACO (Right, 10/01/2017); Aortic valve replacement (N/A, 10/07/2018); TEE without cardioversion (N/A, 10/07/2018); IR GASTROSTOMY TUBE MOD SED (11/04/2018); Esophagogastroduodenoscopy (egd) with propofol (N/A, 11/03/2018); biopsy (11/03/2018); and TEE without cardioversion (N/A, 01/10/2019).   Admitted on 01/24/2019 with   59 year old white lady with complicated medical history.  History is obtained from review of the chart and talking to the bedside nurse and rapid response nurse.  It appears that patient was admitted between October 01, 2018 and December 06, 2018 [prolonged 28-month hospitalization] for fever and acute encephalopathy and found to have a vegetation on her aortic valve and underwent aortic valve replacement with  bioprosthetic valve on October 07, 2018.  Ejection fraction at that time was 55%.  She was briefly intubated for this admission.  The course was complicated by acute kidney injury, urinary retention, elevated liver function tests [now normalized] and PEG tube placement.  She also received blood transfusions.  She was initially DNR  In the last 2 months has been hospitalized twice.  The first was early October for pneumonia and heart failure.  After that she went to rehabilitation.  Then she got admitted again later in October for possible pneumonia and heart failure.  During this time she was noted to have a small pulmonary embolism and Eliquis was started.  On this admission she presented to outside hospital on January 20, 2019 3 days before transfer here.  Initial presentation was acute encephalopathy and fever.  Chest x-ray showed bilateral airspace disease.  Covid was negative procalcitonin elevated per chart.  Blood cultures grew Enterococcus and she was started on vancomycin.  Urine was positive for ESBL UTI.  Echocardiogram showed mitral valve endocarditis.  She did get 1 unit of blood at the outside hospital.  She was then transferred here this admission January 23, 2019.  Here infectious diseases and cardiology has been on board.  At the time of transfer she was on meropenem.  After transfer she is started on ampicillin and ceftriaxone.  She also got 1 dose of fosfomycin.  In the work-up.  Post admission in January 25, 2019 she underwent a TEE and this confirmed mitral valve vegetation.On February 01, 2019 she underwent a CT scan of her coronaries.  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  At this point the Eliquis for pulmonary embolism was converted to IV heparin with Coumadin  From  a respiratory standpoint since admission she has had a slowly worsening chest x-ray but even as of this morning was  requiring 2 L nasal cannula not in distress.  Then developed abrupt onset of respiratory distress breathing 30 times a minute and paradoxical and needing high flow nasal cannula but still pulse ox only 78%.  She has been given 1 dose of Lasix and pulmonary critical care has been consulted for ICU transfer.  She has been n.p.o.  Apparently she is at aspiration risk but has not had any witnessed aspiration though it was suspected.     COURSE  - within < 30-60 min of seeing her and upon arrival from progressive to ICU she had PEA arrest . She had ROSC after that. CCM team spoke to family and decision was made to palliate and provide terminal care  Patient expired Feb 08, 2019          SIGNED Dr. Kalman Shan, M.D., F.C.C.P Pulmonary and Critical Care Medicine Staff Physician [facility] [place] [facility] Pager: [phone], If no answer or between  15:00h - 7:00h: call [phone]  02/10/2019 12:35 PM     \

## 2019-03-03 NOTE — Progress Notes (Signed)
Chaplain responded to withdrawal of care. No family was present. Chaplain stood in for family. Audrey Collier was alert and able to communicate for a short while. Chaplain remained at the bedside until Audrey Collier had fully transitioned from this life to the next. Chaplain sinning off. Total care time 90 minutes.    Chaplain Resident, Evelene Croon, M Div.

## 2019-03-03 NOTE — Significant Event (Signed)
Rapid Response Event Note  Overview: Respiratory Distress and Hypoxia  Initial Focused Assessment: Received a call from nurse with concerns of patient having worsening hypoxia and respiratory distress. I was in a medical emergency and arrived to bedside after a few minutes. Nurse had already received orders for a CXR and Lasix 40 mg IV. Patient was alert, in moderate respiratory distress, she endorsed having trouble breathing, she was already on NRB 15L with saturations 84-86%. RR was 30-32 with accessory muscle use. I turned her HFNC on as well to 15L to help oxygenate the patient, despite that oxygen saturations did not improve. I asked RT to place the patient on [facility] and asked the nurse to page the MD on call. Lung sounds - rhonchi throughout. MD came to the bedside and PCCM was consulted. On 100% 35L  HHFNC, saturations were only 78-84%, RR was still in the mid 30s, and patient now appeared fatigued. Patient could follow simple commands and could answer simple questions, she was alert when I first arrived. HR 120s, stable BP  PCCM MD came to the bedside. Decision made to place the patient on BIPAP for transport to the ICU and then see how the patient does on BIPAP in the ICU. ICU bed was being arranged and once arrange, patient was being transferred to 3M05. During transport to the ICU, saturations dropped into the 60s on BIPAP, they were in the 50s upon arrival to ICU. ICU staff, RT, and I quickly arrange for the patient to undergo RSI. MD was called as well. Patient became bradycardiac, lost pulses, and was in PEA. I initiated chest compressions immediately and ACLS protocol went underway. Intubated in during the arrest, and ROSC was obtained after about 11 minutes. 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   Interventions: -- CXR -- Lasix 40 mg IV x 2 -- HHFNC -- BIPAP  -- Code Blue   Plan of Care:  -- Transferred to 3M05   Event  Summary:  Start Time 3734  Arrival Time 2876  End Time [place]  Vernor Monnig R

## 2019-03-03 NOTE — Progress Notes (Signed)
Gave report to I.C.U. nurse and transported pt.

## 2019-03-03 NOTE — Progress Notes (Signed)
Pt. o2 level dropped to 80% respiratory came and pt the pt on a hiflow 10l  [place] her o2 level went up to 94%

## 2019-03-03 NOTE — Progress Notes (Signed)
Spoke to family.  Ardine Eng (aunt) They do not want to continue aggressive are and do not want Ms Butrick on life support. They are aware that her prognosis is poor and have asked that we do not continue aggressive interventions including ventilation or hemodynamic support such as vasoactive gtts.   Plan Morphine gtt Extubate when ready   Erick Colace ACNP-BC [place] Pager # [phone] OR # [phone] if no answer

## 2019-03-03 NOTE — Progress Notes (Signed)
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  No Known Allergies  Patient Measurements: Height: 5\' 2"  (157.5 cm) Weight: 155 lb 3.3 oz (70.4 kg) IBW/kg (Calculated) : 50.1 Heparin Dosing Weight: 62.5 kg  Vital Signs: Temp: 98.9 F (37.2 C) (12/05 0752) Temp Source: Oral (12/05 0752) BP: 104/76 (12/05 0752) Pulse Rate: 123 (12/05 0752)  Labs: Recent Labs    02/02/19 0500 02/03/19 0528 02/03/19 1500 02/19/2019 0618  HGB 8.0* 8.0*  --  7.8*  HCT 27.3* 27.8*  --  26.6*  PLT 152 151  --  156  APTT  --   --   --  46*  LABPROT  --   --  18.4* 17.8*  INR  --   --  1.5* 1.5*  HEPARINUNFRC  --   --   --  1.22*  CREATININE 0.83 0.87  --  0.90    Estimated Creatinine Clearance: 63.4 mL/min (by C-G formula based on SCr of 0.9 mg/dL).   Medical History: Past Medical History:  Diagnosis Date  . Anxiety   . Dizziness   . Endometrial polyp 03/26/2014  . Fecal occult blood test positive 03/15/2013   will send 3 cards home and refer to GI for colonoscopy  . GERD (gastroesophageal reflux disease)   . Hypothyroidism   . Mental disorder    mentally challenged  . PMB (postmenopausal bleeding) 03/26/2014  . Thickened endometrium 03/26/2014   ?polyp will get HSG    Medications:  Infusions:  . ampicillin (OMNIPEN) IV 2 g (02/14/2019 0336)  . cefTRIAXone (ROCEPHIN)  IV 2 g (02/03/19 2144)  . feeding supplement (OSMOLITE 1.5 CAL) 1,000 mL (02/03/19 1515)  . heparin 800 Units/hr (02/09/2019 0600)    Assessment: 59 yo female with endocarditis, on Eliquis, started recently at outside hospital for small PE.  Cardiac CT now reveals both vegetation and thrombosis on bioprosthetic aortic valve.  Eliquis is not indicated for valve thrombosis.    Discussed with Dr. [place] Crane, who discussed with cardiology. Eliquis stopped and switched to warfarin with IV heparin bridge.  Last dose of  Eliquis 12/4 at 1059 AM.  Dosing heparin based on aPTTs, heparin levels will be falsely elevated given recent Eliquis.  Coumadin started 12/4, baseline INR 1.5.  Today's INR remains 1.5.  Perhaps falsely elevated from Eliquis.  Goal of Therapy:  INR 2-3 Heparin level 0.3-0.7 units/ml Monitor platelets by anticoagulation protocol: Yes   Plan:  Increase IV heparin to 950 units/hr. Recheck aPTT in 6 hrs.  Coumadin 5 mg x 1 tonight. Daily aPTT, heparin level and INR. Will need Coumadin education prior to discharge.  Marguerite Olea, [facility] Clinical Pharmacist Phone [phone]  02/02/2019 9:03 AM

## 2019-03-03 DEATH — deceased

## 2020-03-07 IMAGING — CT CT ABDOMEN W/O CM
2 of 3 series · 13 of 36 positions shown, 18 images · non-contrast
Comparison: CT chest 10/05/2018.  CT abdomen 12/05/2014

CLINICAL DATA: Evaluate anatomy for percutaneous gastrostomy tube
placement. History of aortic valve replacement. Poor appetite and
nutrition.

EXAM:
CT ABDOMEN WITHOUT CONTRAST
TECHNIQUE: Multidetector CT imaging of the abdomen was performed following the
standard protocol without IV contrast.

[Series 3: abd/ pelvis 5.0 i30f 2 · axial · 0.80mm/px · z∈[+1046,+1310]mm · 12 of 62 slices shown, 16 images]
[im 6/62  soft-tissue]
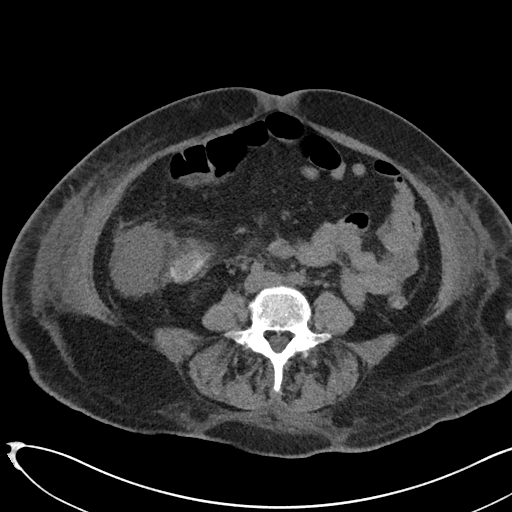
[im 6/62  bone]
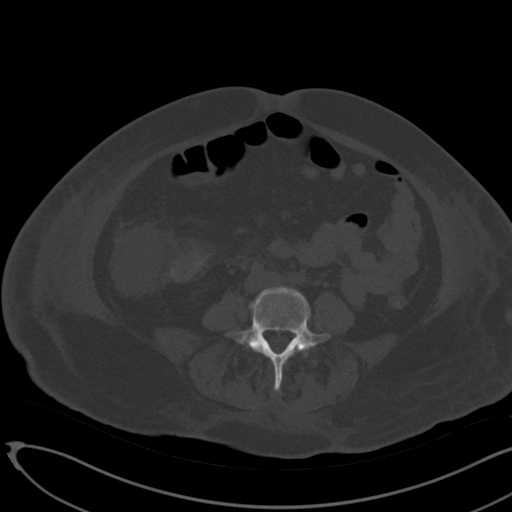
[im 12/62  soft-tissue]
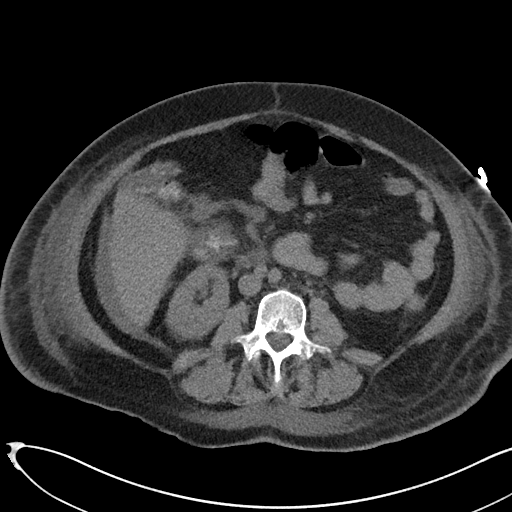
[im 17/62  soft-tissue]
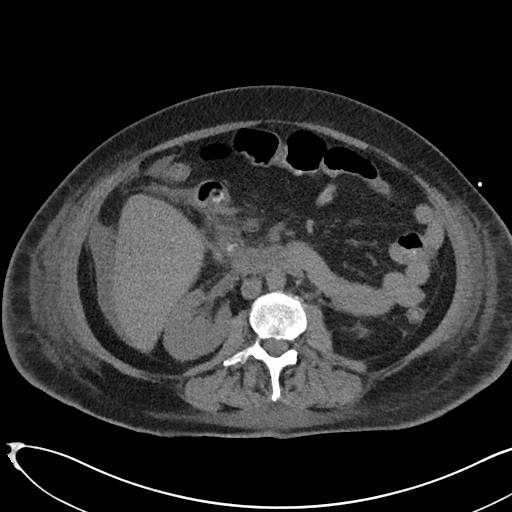
[im 23/62  soft-tissue]
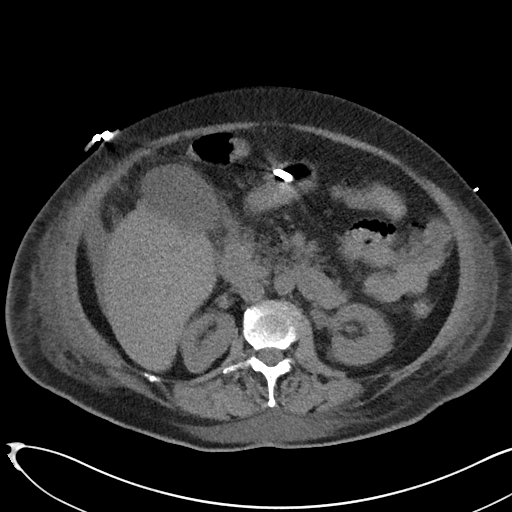
[im 28/62  soft-tissue]
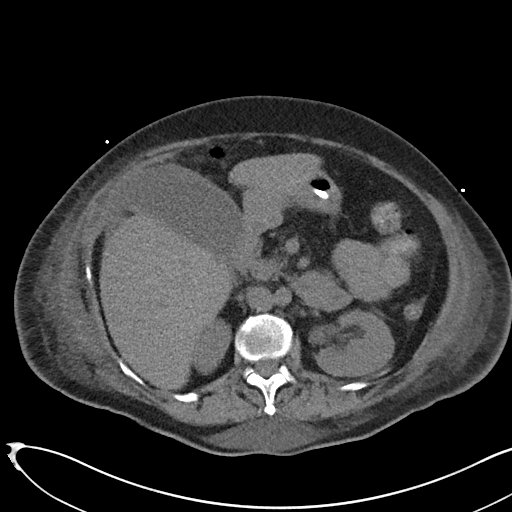
[im 34/62  soft-tissue]
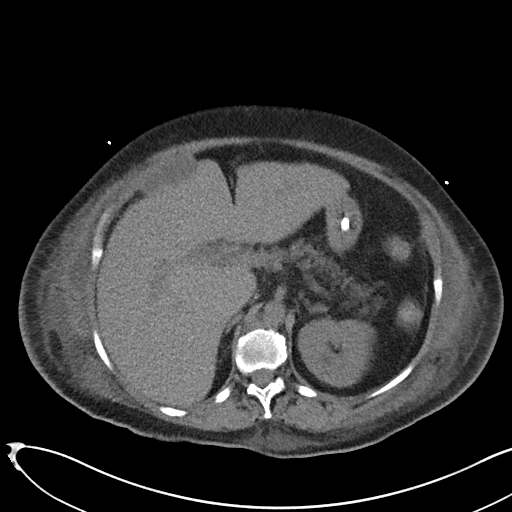
[im 39/62  soft-tissue]
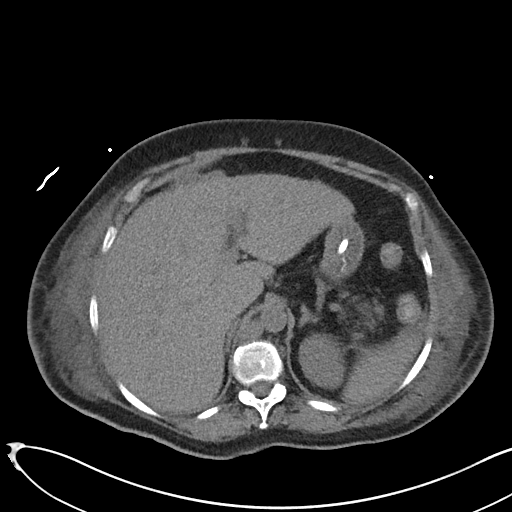
[im 45/62  soft-tissue]
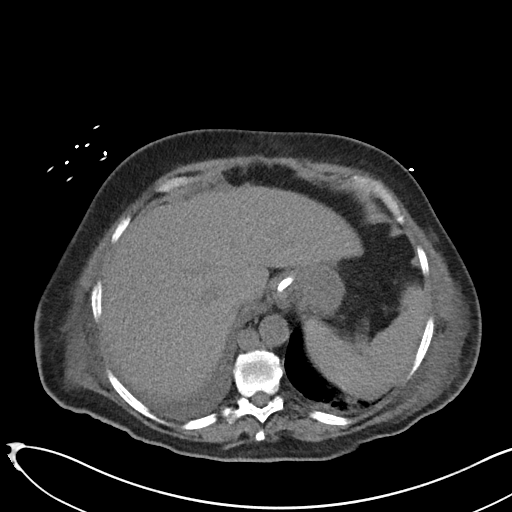
[im 50/62  soft-tissue]
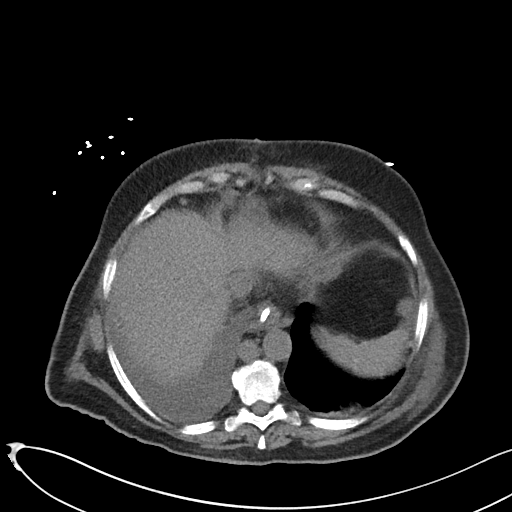
[im 50/62  lung]
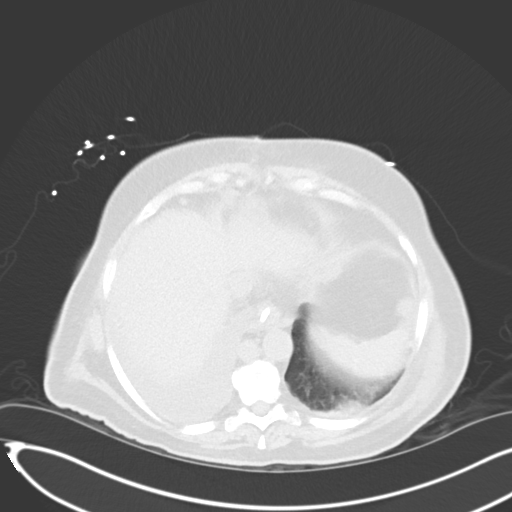
[im 50/62  bone]
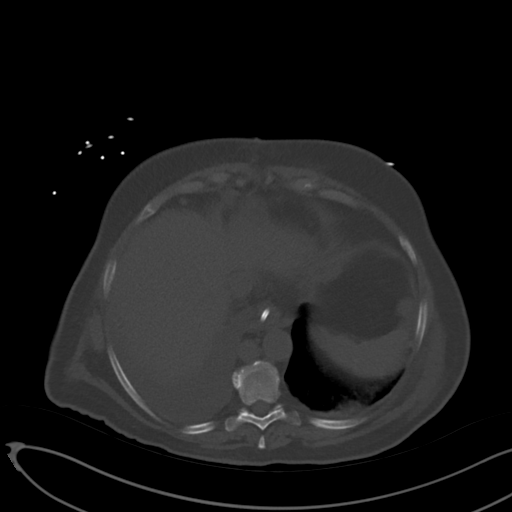
[im 53/62  lung]
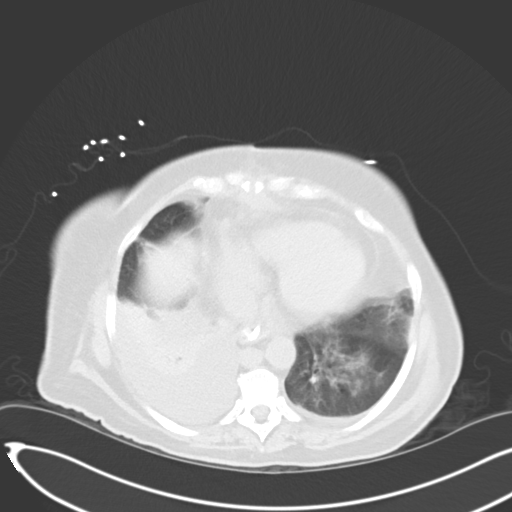
[im 56/62  soft-tissue]
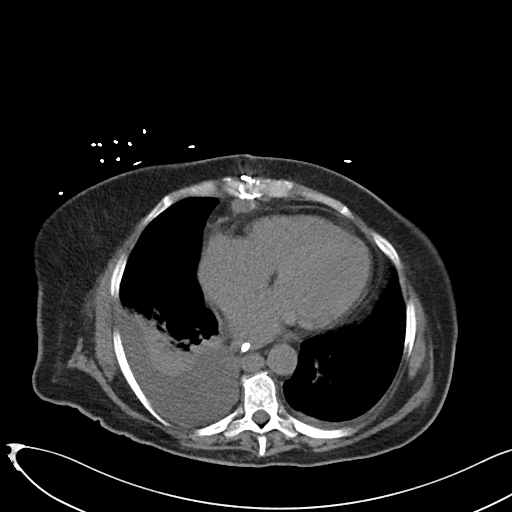
[im 56/62  lung]
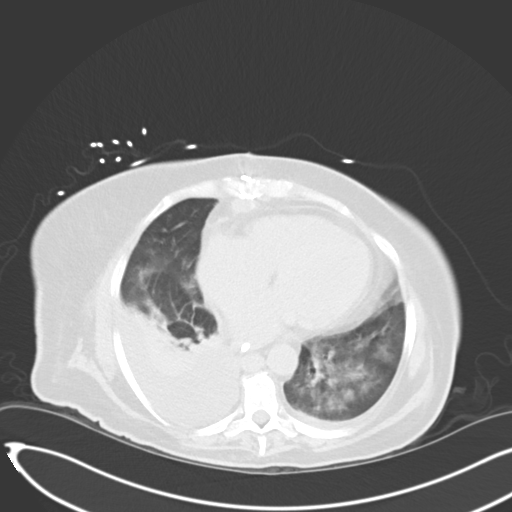
[im 59/62  lung]
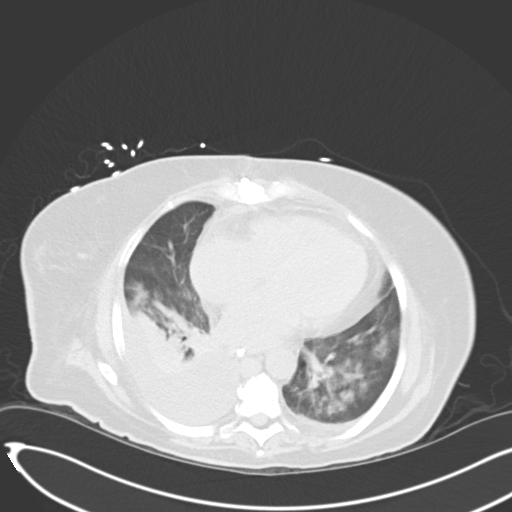

[Series 7: sag st · sagittal · 0.60mm/px · 1 of 148 slices shown, 2 images]
[im 50/148  soft-tissue]
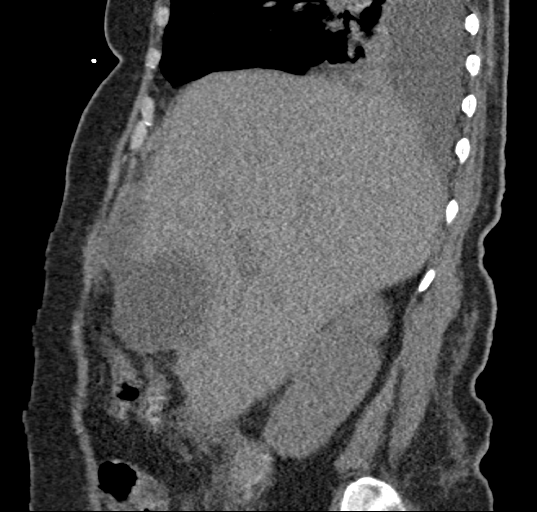
[im 50/148  bone]
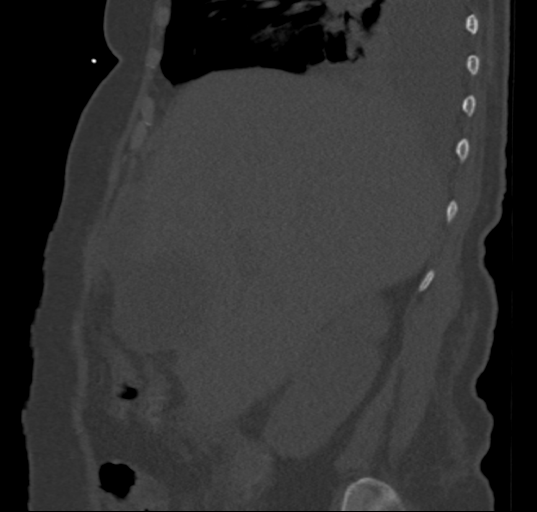

[13 of 36 positions shown; findings below may reference images not displayed]

FINDINGS: Lower chest: Small to moderate sized right pleural effusion with
compressive atelectasis in the right lower lobe. Aortic valve
replacement. Patchy airspace densities in left lower lobe are poorly
visualized due to motion artifact. Findings are suggestive for
infectious or inflammatory process.

Hepatobiliary: Gallbladder is at least moderately distended and
appears to be contiguous with a small amount of perihepatic fluid.
There was perihepatic fluid and gallbladder distention on the CT
from 10/05/2018. Perihepatic fluid may be loculated with a component
along the right inferior aspect of the liver and another component
along the anterior liver and adjacent to the gallbladder. Difficult
to exclude gallbladder inflammation. Limited evaluation for a
hepatic lesion on this non contrast examination.

Pancreas: Unremarkable. No pancreatic ductal dilatation or
surrounding inflammatory changes.

Spleen: Normal in size without focal abnormality.

Adrenals/Urinary Tract: Normal adrenal glands. Normal appearance of
both kidneys. No hydronephrosis.

Stomach/Bowel: Nasogastric tube tip in the distal stomach body
region. Transverse colon is caudal to the stomach. There should be a
percutaneous window for gastrostomy tube placement after stomach
insufflation. No evidence for bowel dilatation. Mild mesenteric
edema in the right upper abdomen near the liver and duodenum.

Vascular/Lymphatic: Visualized vascular structures are unremarkable.
No significant lymph node enlargement.

Other: Subcutaneous edema.  Negative for free air.

Musculoskeletal: Prior median sternotomy.
IMPRESSION: 1. Anatomy should be amendable for percutaneous gastrostomy tube
placement. Nasogastric tube tip in the distal stomach body region.
2. Perihepatic fluid collections with at least mild gallbladder
distension. One perihepatic collection appears to be contiguous with
the gallbladder and cannot exclude gallbladder inflammation.
Recommend further characterization of the gallbladder and
perihepatic space with a right upper quadrant ultrasound.
3. Right pleural effusion with compressive atelectasis in the right
lower lobe. Poorly characterized patchy densities in left lower lobe
due to motion artifact but concerning for residual inflammation or
infection in left lower lobe.
